# Patient Record
Sex: Male | Born: 1940 | Hispanic: No | Marital: Married | Smoking: Never smoker
Health system: Southern US, Community
[De-identification: ages and names within clinical notes are randomized; demographics above are authoritative.]

## PROBLEM LIST (undated history)

## (undated) DIAGNOSIS — Z8719 Personal history of other diseases of the digestive system: Secondary | ICD-10-CM

## (undated) DIAGNOSIS — D689 Coagulation defect, unspecified: Secondary | ICD-10-CM

## (undated) DIAGNOSIS — I5022 Chronic systolic (congestive) heart failure: Secondary | ICD-10-CM

## (undated) DIAGNOSIS — I5020 Unspecified systolic (congestive) heart failure: Secondary | ICD-10-CM

## (undated) DIAGNOSIS — I4729 Other ventricular tachycardia: Secondary | ICD-10-CM

## (undated) DIAGNOSIS — Z87442 Personal history of urinary calculi: Secondary | ICD-10-CM

## (undated) DIAGNOSIS — Z5189 Encounter for other specified aftercare: Secondary | ICD-10-CM

## (undated) DIAGNOSIS — I471 Supraventricular tachycardia, unspecified: Secondary | ICD-10-CM

## (undated) DIAGNOSIS — H409 Unspecified glaucoma: Secondary | ICD-10-CM

## (undated) DIAGNOSIS — G4730 Sleep apnea, unspecified: Secondary | ICD-10-CM

## (undated) DIAGNOSIS — I509 Heart failure, unspecified: Secondary | ICD-10-CM

## (undated) DIAGNOSIS — I77810 Thoracic aortic ectasia: Secondary | ICD-10-CM

## (undated) DIAGNOSIS — J309 Allergic rhinitis, unspecified: Secondary | ICD-10-CM

## (undated) DIAGNOSIS — K219 Gastro-esophageal reflux disease without esophagitis: Secondary | ICD-10-CM

## (undated) DIAGNOSIS — K279 Peptic ulcer, site unspecified, unspecified as acute or chronic, without hemorrhage or perforation: Secondary | ICD-10-CM

## (undated) DIAGNOSIS — K5792 Diverticulitis of intestine, part unspecified, without perforation or abscess without bleeding: Secondary | ICD-10-CM

## (undated) DIAGNOSIS — I2510 Atherosclerotic heart disease of native coronary artery without angina pectoris: Secondary | ICD-10-CM

## (undated) DIAGNOSIS — I493 Ventricular premature depolarization: Secondary | ICD-10-CM

## (undated) DIAGNOSIS — K922 Gastrointestinal hemorrhage, unspecified: Secondary | ICD-10-CM

## (undated) DIAGNOSIS — E785 Hyperlipidemia, unspecified: Secondary | ICD-10-CM

## (undated) DIAGNOSIS — M1990 Unspecified osteoarthritis, unspecified site: Secondary | ICD-10-CM

## (undated) DIAGNOSIS — M26609 Unspecified temporomandibular joint disorder, unspecified side: Secondary | ICD-10-CM

## (undated) DIAGNOSIS — I499 Cardiac arrhythmia, unspecified: Secondary | ICD-10-CM

## (undated) DIAGNOSIS — I10 Essential (primary) hypertension: Principal | ICD-10-CM

## (undated) HISTORY — DX: Peptic ulcer, site unspecified, unspecified as acute or chronic, without hemorrhage or perforation: K27.9

## (undated) HISTORY — DX: Coagulation defect, unspecified: D68.9

## (undated) HISTORY — DX: Allergic rhinitis, unspecified: J30.9

## (undated) HISTORY — DX: Unspecified systolic (congestive) heart failure: I50.20

## (undated) HISTORY — DX: Encounter for other specified aftercare: Z51.89

## (undated) HISTORY — PX: ROTATOR CUFF REPAIR: SHX139

## (undated) HISTORY — DX: Supraventricular tachycardia: I47.1

## (undated) HISTORY — DX: Thoracic aortic ectasia: I77.810

## (undated) HISTORY — DX: Chronic systolic (congestive) heart failure: I50.22

## (undated) HISTORY — DX: Gastrointestinal hemorrhage, unspecified: K92.2

## (undated) HISTORY — DX: Unspecified osteoarthritis, unspecified site: M19.90

## (undated) HISTORY — DX: Other ventricular tachycardia: I47.29

## (undated) HISTORY — DX: Ventricular premature depolarization: I49.3

## (undated) HISTORY — DX: Unspecified temporomandibular joint disorder, unspecified side: M26.609

## (undated) HISTORY — DX: Sleep apnea, unspecified: G47.30

## (undated) HISTORY — DX: Atherosclerotic heart disease of native coronary artery without angina pectoris: I25.10

## (undated) HISTORY — DX: Hyperlipidemia, unspecified: E78.5

## (undated) HISTORY — DX: Personal history of urinary calculi: Z87.442

## (undated) HISTORY — PX: JOINT REPLACEMENT: SHX530

## (undated) HISTORY — DX: Supraventricular tachycardia, unspecified: I47.10

## (undated) HISTORY — PX: EYE SURGERY: SHX253

## (undated) HISTORY — DX: Unspecified glaucoma: H40.9

## (undated) HISTORY — DX: Essential (primary) hypertension: I10

---

## 1999-12-31 ENCOUNTER — Inpatient Hospital Stay (HOSPITAL_COMMUNITY): Admission: EM | Admit: 1999-12-31 | Discharge: 2000-01-01 | Payer: Self-pay | Admitting: Specialist

## 2000-11-07 ENCOUNTER — Observation Stay (HOSPITAL_COMMUNITY): Admission: RE | Admit: 2000-11-07 | Discharge: 2000-11-08 | Payer: Self-pay | Admitting: Specialist

## 2003-05-15 ENCOUNTER — Ambulatory Visit (HOSPITAL_BASED_OUTPATIENT_CLINIC_OR_DEPARTMENT_OTHER): Admission: RE | Admit: 2003-05-15 | Discharge: 2003-05-15 | Payer: Self-pay | Admitting: Urology

## 2003-05-15 ENCOUNTER — Encounter (INDEPENDENT_AMBULATORY_CARE_PROVIDER_SITE_OTHER): Payer: Self-pay | Admitting: *Deleted

## 2003-05-15 HISTORY — PX: HYDROCELE EXCISION: SHX482

## 2003-05-15 HISTORY — PX: SPERMATOCELECTOMY: SHX2420

## 2005-05-24 ENCOUNTER — Ambulatory Visit: Payer: Self-pay | Admitting: Internal Medicine

## 2005-09-30 ENCOUNTER — Ambulatory Visit: Payer: Self-pay | Admitting: Internal Medicine

## 2005-10-18 ENCOUNTER — Ambulatory Visit: Payer: Self-pay | Admitting: Internal Medicine

## 2005-10-25 ENCOUNTER — Ambulatory Visit: Payer: Self-pay | Admitting: Internal Medicine

## 2005-11-15 ENCOUNTER — Ambulatory Visit: Payer: Self-pay | Admitting: Internal Medicine

## 2006-12-09 ENCOUNTER — Encounter: Admission: RE | Admit: 2006-12-09 | Discharge: 2006-12-09 | Payer: Self-pay | Admitting: Gastroenterology

## 2006-12-26 ENCOUNTER — Ambulatory Visit: Payer: Self-pay | Admitting: Internal Medicine

## 2007-02-23 ENCOUNTER — Ambulatory Visit: Payer: Self-pay | Admitting: Internal Medicine

## 2007-07-13 ENCOUNTER — Ambulatory Visit: Payer: Self-pay | Admitting: Internal Medicine

## 2007-07-13 DIAGNOSIS — K279 Peptic ulcer, site unspecified, unspecified as acute or chronic, without hemorrhage or perforation: Secondary | ICD-10-CM | POA: Insufficient documentation

## 2007-07-13 DIAGNOSIS — Z87442 Personal history of urinary calculi: Secondary | ICD-10-CM

## 2007-07-13 DIAGNOSIS — R079 Chest pain, unspecified: Secondary | ICD-10-CM | POA: Insufficient documentation

## 2007-07-13 DIAGNOSIS — I10 Essential (primary) hypertension: Secondary | ICD-10-CM

## 2007-07-13 DIAGNOSIS — J309 Allergic rhinitis, unspecified: Secondary | ICD-10-CM

## 2007-07-13 DIAGNOSIS — E785 Hyperlipidemia, unspecified: Secondary | ICD-10-CM | POA: Insufficient documentation

## 2007-07-13 HISTORY — DX: Allergic rhinitis, unspecified: J30.9

## 2007-07-13 HISTORY — DX: Hyperlipidemia, unspecified: E78.5

## 2007-07-13 HISTORY — DX: Peptic ulcer, site unspecified, unspecified as acute or chronic, without hemorrhage or perforation: K27.9

## 2007-07-13 HISTORY — DX: Essential (primary) hypertension: I10

## 2007-07-13 HISTORY — DX: Personal history of urinary calculi: Z87.442

## 2007-09-21 ENCOUNTER — Telehealth: Payer: Self-pay | Admitting: Internal Medicine

## 2007-10-16 ENCOUNTER — Ambulatory Visit: Payer: Self-pay | Admitting: Internal Medicine

## 2007-11-30 LAB — HM COLONOSCOPY

## 2008-09-03 ENCOUNTER — Ambulatory Visit: Payer: Self-pay | Admitting: Internal Medicine

## 2008-09-03 DIAGNOSIS — J069 Acute upper respiratory infection, unspecified: Secondary | ICD-10-CM | POA: Insufficient documentation

## 2008-12-23 ENCOUNTER — Encounter: Admission: RE | Admit: 2008-12-23 | Discharge: 2008-12-23 | Payer: Self-pay | Admitting: Specialist

## 2009-04-15 ENCOUNTER — Ambulatory Visit: Payer: Self-pay | Admitting: Internal Medicine

## 2009-04-15 DIAGNOSIS — M26609 Unspecified temporomandibular joint disorder, unspecified side: Secondary | ICD-10-CM

## 2009-04-15 HISTORY — DX: Unspecified temporomandibular joint disorder, unspecified side: M26.609

## 2009-06-04 ENCOUNTER — Encounter: Payer: Self-pay | Admitting: Internal Medicine

## 2009-06-19 ENCOUNTER — Encounter: Payer: Self-pay | Admitting: Internal Medicine

## 2009-07-09 ENCOUNTER — Ambulatory Visit: Payer: Self-pay | Admitting: Internal Medicine

## 2009-07-09 LAB — CONVERTED CEMR LAB
ALT: 23 units/L (ref 0–53)
Albumin: 3.7 g/dL (ref 3.5–5.2)
Alkaline Phosphatase: 43 units/L (ref 39–117)
Basophils Absolute: 0 10*3/uL (ref 0.0–0.1)
Basophils Relative: 0 % (ref 0.0–3.0)
Bilirubin Urine: NEGATIVE
CO2: 29 meq/L (ref 19–32)
Chloride: 105 meq/L (ref 96–112)
Cholesterol: 210 mg/dL — ABNORMAL HIGH (ref 0–200)
Eosinophils Absolute: 0.9 10*3/uL — ABNORMAL HIGH (ref 0.0–0.7)
Eosinophils Relative: 9.7 % — ABNORMAL HIGH (ref 0.0–5.0)
GFR calc non Af Amer: 79.04 mL/min (ref >60)
HCT: 43.2 % (ref 39.0–52.0)
HDL: 36.6 mg/dL — ABNORMAL LOW (ref >39.00)
Hemoglobin: 14.8 g/dL (ref 13.0–17.0)
MCHC: 34.3 g/dL (ref 30.0–36.0)
MCV: 94.3 fL (ref 78.0–100.0)
Monocytes Relative: 5 % (ref 3.0–12.0)
Nitrite: NEGATIVE
PSA: 0.73 ng/mL (ref 0.10–4.00)
Platelets: 200 10*3/uL (ref 150.0–400.0)
RBC: 4.58 M/uL (ref 4.22–5.81)
Specific Gravity, Urine: 1.02
TSH: 1.91 microintl units/mL (ref 0.35–5.50)
Total Protein: 6.3 g/dL (ref 6.0–8.3)
Urobilinogen, UA: 0.2
VLDL: 39.6 mg/dL (ref 0.0–40.0)
WBC Urine, dipstick: NEGATIVE

## 2009-07-15 ENCOUNTER — Encounter: Payer: Self-pay | Admitting: Internal Medicine

## 2009-07-16 ENCOUNTER — Ambulatory Visit: Payer: Self-pay | Admitting: Internal Medicine

## 2009-08-25 ENCOUNTER — Telehealth: Payer: Self-pay | Admitting: Internal Medicine

## 2010-02-19 ENCOUNTER — Encounter: Payer: Self-pay | Admitting: Internal Medicine

## 2010-03-27 ENCOUNTER — Ambulatory Visit: Payer: Self-pay | Admitting: Family Medicine

## 2010-12-27 LAB — CONVERTED CEMR LAB
AST: 19 units/L (ref 0–37)
Albumin: 4.1 g/dL (ref 3.5–5.2)
BUN: 18 mg/dL (ref 6–23)
Basophils Absolute: 0 10*3/uL (ref 0.0–0.1)
Basophils Relative: 0.1 % (ref 0.0–1.0)
CO2: 27 meq/L (ref 19–32)
Chloride: 110 meq/L (ref 96–112)
Creatinine, Ser: 1.3 mg/dL (ref 0.4–1.5)
Eosinophils Absolute: 1.4 10*3/uL — ABNORMAL HIGH (ref 0.0–0.6)
GFR calc Af Amer: 71 mL/min
GFR calc non Af Amer: 59 mL/min
Hemoglobin: 15 g/dL (ref 13.0–17.0)
Lymphocytes Relative: 14.2 % (ref 12.0–46.0)
MCHC: 34.9 g/dL (ref 30.0–36.0)
MCV: 94.4 fL (ref 78.0–100.0)
Monocytes Absolute: 0.4 10*3/uL (ref 0.2–0.7)
Monocytes Relative: 4.2 % (ref 3.0–11.0)
PSA: 2.02 ng/mL (ref 0.10–4.00)
Platelets: 283 10*3/uL (ref 150–400)
Potassium: 4.2 meq/L (ref 3.5–5.1)
RBC: 4.55 M/uL (ref 4.22–5.81)
Sodium: 143 meq/L (ref 135–145)
TSH: 1.1 microintl units/mL (ref 0.35–5.50)
Total Protein: 6.7 g/dL (ref 6.0–8.3)

## 2010-12-31 NOTE — Miscellaneous (Signed)
Summary: [facility]  Clinical Lists Changes  Medications: Changed medication from CIALIS 10 MG TABS (TADALAFIL) once daily as directed to * CIALIS 5 MG TABS (TADALAFIL) once daily as directed - Signed Rx of CIALIS 5 MG TABS (TADALAFIL) once daily as directed;  #90 x 6;  Signed;  Entered by: Duard Brady LPN;  Authorized by: Gordy Savers  MD;  Method used: Faxed to [facility] [address] [phone] [place], [place], [place]  [address], Ph: [phone], Fax: [phone]    Prescriptions: CIALIS 5 MG TABS (TADALAFIL) once daily as directed  #90 x 6   Entered by:   Duard Brady LPN   Authorized by:   Gordy Savers  MD   Signed by:   Duard Brady LPN on 65/78/4696   Method used:   Faxed to ...       [facility] [address] (retail)       [phone]       [place], [place]  [address]       Ph: [phone]       Fax: [phone]   RxID:   [id number]

## 2010-12-31 NOTE — Assessment & Plan Note (Signed)
Summary: COLD/COUGH/NJR   Vital Signs:  Patient profile:   70 year old male Weight:      183 pounds Temp:     98.3 degrees F oral BP sitting:   160 / 88  (left arm) Cuff size:   regular  Vitals Entered By: Sid Falcon LPN (March 27, 2010 1:51 PM) CC: Cough X 2 days   History of Present Illness: Patient seen with 2 to three-day history of mostly nonproductive cough, bilateral ear pressure, nasal congestion and night sweats but no fevers. Mild body aches. Has been dealing with some allergy symptoms over the past several weeks. Has taken Allegra and Zyrtec without much improvement.  Allergies: 1)  Ultram (Tramadol Hcl)  Past History:  Past Medical History: Last updated: 07/13/2007 Allergic rhinitis Hypertension  Hyperlipidemia Peptic ulcer disease  Review of Systems      See HPI  Physical Exam  General:  Well-developed,well-nourished,in no acute distress; alert,appropriate and cooperative throughout examination Head:  Normocephalic and atraumatic without obvious abnormalities. No apparent alopecia or balding. Ears:  right ear effusion. Left  appears normal Nose:  clear mucous bilaterally Mouth:  Oral mucosa and oropharynx without lesions or exudates.  Teeth in good repair. Neck:  No deformities, masses, or tenderness noted. Lungs:  Normal respiratory effort, chest expands symmetrically. Lungs are clear to auscultation, no crackles or wheezes. Heart:  normal rate and regular rhythm.     Impression & Recommendations:  Problem # 1:  URI (ICD-465.9) cough med for suppresion.  Suspect viral URI. His updated medication list for this problem includes:    Hydrocodone-homatropine 5-1.5 Mg/39ml Syrp (Hydrocodone-homatropine) .Marland Kitchen... 1 teaspoon every 6 hours as needed for cough  Orders: Prescription Created Electronically [phone])  Complete Medication List: 1)  Klonopin 0.5 Mg Tabs (Clonazepam) .Marland Kitchen.. 1 every night 2)  Prilosec 20 Mg Cpdr (Omeprazole) .... Take 1 capsule by  mouth once a day 3)  Hydrochlorothiazide 25 Mg Tabs (Hydrochlorothiazide) .Marland Kitchen.. 1 once daily 4)  Cialis 5 Mg Tabs (tadalafil)  .... Once daily as directed 5)  Hydrocodone-homatropine 5-1.5 Mg/76ml Syrp (Hydrocodone-homatropine) .Marland Kitchen.. 1 teaspoon every 6 hours as needed for cough 6)  Fluticasone Propionate 50 Mcg/act Susp (Fluticasone propionate) .... 2 sprays per nostril once daily as needed allergies  Patient Instructions: 1)  Get plenty of rest, drink lots of clear liquids, and use Tylenol or Ibuprofen for fever and comfort. Return in 7-10 days if you're not better: sooner if you'er feeling worse.  2)  Check your  Blood Pressure regularly . If it is above: 140/90  you should make an appointment. Prescriptions: HYDROCODONE-HOMATROPINE 5-1.5 MG/5ML SYRP (HYDROCODONE-HOMATROPINE) 1 teaspoon every 6 hours as needed for cough  #6 oz x 0   Entered and Authorized by:   Evelena Peat MD   Signed by:   Evelena Peat MD on 03/27/2010   Method used:   Print then Give to Patient   RxID:   [id number] FLUTICASONE PROPIONATE 50 MCG/ACT SUSP (FLUTICASONE PROPIONATE) 2 sprays per nostril once daily as needed allergies  #1 x 5   Entered and Authorized by:   Evelena Peat MD   Signed by:   Evelena Peat MD on 03/27/2010   Method used:   Electronically to        [facility] [address] (retail)       [phone]       [place], [place]  [address]       Ph: [phone]  Fax: [phone]   RxID:   [id number]

## 2011-03-29 ENCOUNTER — Ambulatory Visit (INDEPENDENT_AMBULATORY_CARE_PROVIDER_SITE_OTHER): Payer: PRIVATE HEALTH INSURANCE | Admitting: Internal Medicine

## 2011-03-29 ENCOUNTER — Encounter: Payer: Self-pay | Admitting: Internal Medicine

## 2011-03-29 DIAGNOSIS — I10 Essential (primary) hypertension: Secondary | ICD-10-CM

## 2011-03-29 DIAGNOSIS — J309 Allergic rhinitis, unspecified: Secondary | ICD-10-CM

## 2011-03-29 DIAGNOSIS — J069 Acute upper respiratory infection, unspecified: Secondary | ICD-10-CM

## 2011-03-29 MED ORDER — HYDROCODONE-HOMATROPINE 5-1.5 MG/5ML PO SYRP: 5.0000 mL | ORAL_SOLUTION | Freq: Four times a day (QID) | ORAL | Status: DC | PRN

## 2011-03-29 NOTE — Patient Instructions (Signed)
Get plenty of rest, Drink lots of  clear liquids, and use Tylenol or ibuprofen for fever and discomfort.    Return in 6 months for follow-up  Call or return to clinic prn if these symptoms worsen or fail to improve as anticipated.

## 2011-03-29 NOTE — Progress Notes (Signed)
  Subjective:    [name], male    DOB: [birth year], 70 y.o.   MRN: [id number]  HPI A 70 year old patient who is seen today for followup of hypertension. Complaints today include the sinus congestion nonproductive cough and sore throat. He does have a history of allergic rhinitis. No fever chest pain shortness of breath.   Review of Systems  Constitutional: Positive for fatigue. Negative for fever, chills and appetite change.  HENT: Positive for congestion. Negative for hearing loss, ear pain, sore throat, trouble swallowing, neck stiffness, dental problem, voice change and tinnitus.   Eyes: Negative for pain, discharge and visual disturbance.  Respiratory: Positive for cough. Negative for chest tightness, wheezing and stridor.   Cardiovascular: Negative for chest pain, palpitations and leg swelling.  Gastrointestinal: Negative for nausea, vomiting, abdominal pain, diarrhea, constipation, blood in stool and abdominal distention.  Genitourinary: Negative for urgency, hematuria, flank pain, discharge, difficulty urinating and genital sores.  Musculoskeletal: Negative for myalgias, back pain, joint swelling, arthralgias and gait problem.  Skin: Negative for rash.  Neurological: Negative for dizziness, syncope, speech difficulty, weakness, numbness and headaches.  Hematological: Negative for adenopathy. Does not bruise/bleed easily.  Psychiatric/Behavioral: Negative for behavioral problems and dysphoric mood. The patient is not nervous/anxious.        Objective:   Physical Exam  Constitutional: He is oriented to person, place, and time. He appears well-developed and well-nourished. No distress.       Overweight no distress. Blood pressure 130/80  HENT:  Head: Normocephalic.  Right Ear: External ear normal.  Left Ear: External ear normal.  Eyes: Conjunctivae and EOM are normal.  Neck: Normal range of motion.  Cardiovascular: Normal rate and normal heart sounds.     Pulmonary/Chest: Breath sounds normal.  Abdominal: Bowel sounds are normal.  Musculoskeletal: Normal range of motion. He exhibits no edema and no tenderness.  Neurological: He is alert and oriented to person, place, and time.  Psychiatric: He has a normal mood and affect. His behavior is normal.          Assessment & Plan:    Hypertension stable URI. Will treat symptomatically Allergic rhinitis- samples of Nasonex dispensed  We'll schedule for CPX

## 2011-04-01 ENCOUNTER — Other Ambulatory Visit: Payer: Self-pay | Admitting: Internal Medicine

## 2011-04-13 ENCOUNTER — Other Ambulatory Visit (INDEPENDENT_AMBULATORY_CARE_PROVIDER_SITE_OTHER): Payer: PRIVATE HEALTH INSURANCE | Admitting: Internal Medicine

## 2011-04-13 DIAGNOSIS — E785 Hyperlipidemia, unspecified: Secondary | ICD-10-CM

## 2011-04-13 DIAGNOSIS — E669 Obesity, unspecified: Secondary | ICD-10-CM

## 2011-04-13 DIAGNOSIS — Z0000 Encounter for general adult medical examination without abnormal findings: Secondary | ICD-10-CM

## 2011-04-13 DIAGNOSIS — Z13220 Encounter for screening for lipoid disorders: Secondary | ICD-10-CM

## 2011-04-13 DIAGNOSIS — I10 Essential (primary) hypertension: Secondary | ICD-10-CM

## 2011-04-13 LAB — BASIC METABOLIC PANEL
BUN: 21 mg/dL (ref 6–23)
Chloride: 104 mEq/L (ref 96–112)
Creatinine, Ser: 1.2 mg/dL (ref 0.4–1.5)

## 2011-04-13 LAB — POCT URINALYSIS DIPSTICK
Bilirubin, UA: NEGATIVE
Blood, UA: NEGATIVE
Glucose, UA: NEGATIVE
Ketones, UA: NEGATIVE
Leukocytes, UA: NEGATIVE
Urobilinogen, UA: 0.2
pH, UA: 7

## 2011-04-13 LAB — CBC WITH DIFFERENTIAL/PLATELET
Basophils Absolute: 0 10*3/uL (ref 0.0–0.1)
Basophils Relative: 0.5 % (ref 0.0–3.0)
HCT: 41.5 % (ref 39.0–52.0)
Hemoglobin: 14.6 g/dL (ref 13.0–17.0)
Monocytes Absolute: 0.4 10*3/uL (ref 0.1–1.0)
Neutro Abs: 4 10*3/uL (ref 1.4–7.7)
Neutrophils Relative %: 62.7 % (ref 43.0–77.0)
RBC: 4.48 Mil/uL (ref 4.22–5.81)
RDW: 13.8 % (ref 11.5–14.6)

## 2011-04-13 LAB — LIPID PANEL
Cholesterol: 201 mg/dL — ABNORMAL HIGH (ref 0–200)
HDL: 36.9 mg/dL — ABNORMAL LOW (ref >39.00)
Triglycerides: 269 mg/dL — ABNORMAL HIGH (ref 0.0–149.0)

## 2011-04-13 LAB — PSA: PSA: 1.12 ng/mL (ref 0.10–4.00)

## 2011-04-13 LAB — HEPATIC FUNCTION PANEL
Alkaline Phosphatase: 45 U/L (ref 39–117)
Bilirubin, Direct: 0.1 mg/dL (ref 0.0–0.3)
Total Bilirubin: 0.7 mg/dL (ref 0.3–1.2)

## 2011-04-16 NOTE — Op Note (Signed)
[place]  Patient:    David Goodman, David Goodman                         MRN: [id number] Proc. Date: 11/07/00 Adm. Date:  [id number] Attending:  Montez Morita, Philips John                           Operative Report  PREOPERATIVE DIAGNOSIS:  Rotator cuff tear, right shoulder  POSTOPERATIVE DIAGNOSIS:  Rotator cuff tear, right shoulder.  OPERATIVE PROCEDURE:  Repair of rotator cuff tear, right shoulder.  SURGEON:  Deloris Ping. Montez Morita, M.D.  ASSISTANT:  Druscilla Brownie. Shela Nevin, P.A.  DESCRIPTION OF PROCEDURE:  After a suitable general anesthesia, he was positioned in the shoulder and prepped and draped routinely.  An incision was made from the distal clavicle to about one inch on the acromion, in line with the anterior acromion.  The deltoid was split in line with fat, and the tissue from the anterior acromion was removed -- including the coracoacromial ligament.  A wide cobb was placed underneath, and an anterior acromioplasty accomplished with the saw with a beveling action.  The coracoacromial ligament was followed to its distal attachment on the coracoid, and released there as well, so that in reattachment there would not be any tightness to it. A bit of the bursa was removed.  The tear was identified just as described on the MR. A bur was used to freshen up and create a little mini-trough over the humeral head and at its site of attachment.  Three drill holes were then placed through the bone distal to that, coming out into the little trough; allowing two sutures to pass through the drill holes with a central hole being shared by each limb of a mattress suture.  Two mattress sutures were then placed of #1 Tycron from the edge.  This was freshened up with a knife, back into the substance of the primarily supraspinatus, but also a little bit of the infraspinatus towards the back hitch.  As one suture was held down snugly, the other was tied, then the second one  tied.  Then some additional #1 PDS suture was used smooth things out.  The same PDS was used with three sutures being passed through two drill holes in the acromion, through the anterior deltoid and incorporating the coracoacromial ligament.  The anterior deltoid was repaired with some additional PDS in the fascia of the deltoid where it had been split.  Used some 2-0 in the subcutaneous and then a monocryl running suture, with Steri-Strips on the skin.  Placed into a velcro sling.  Prior to the start of the procedure, the patient had an intrascalene block done in the holding area by anesthesia for pain control in the postoperative period.  He went to recovery in good condition. DD:  11/07/00 TD:  11/07/00 Job: [id number]

## 2011-04-16 NOTE — H&P (Signed)
[facility]  Patient:    David Goodman, David Goodman                         MRN: [id number] Adm. Date:  11/07/00 Dictator:   Dorie Rank, P.A. CC:         David Goodman, M.D.  David Goodman, M.D. [facility] [place]   History and Physical  CHIEF COMPLAINT:  Right shoulder pain.  HISTORY OF PRESENT ILLNESS:  David Goodman has had increasing right shoulder pain for approximately one and a half years.  He had an MRI on December 15, 1999, which showed a full thickness tear of the right supraspinatus tendon and infraspinatus tendonosis/tendonopathy.  He has had minimal response to p.o. pain medications and anti-inflammatories.  PAST MEDICAL HISTORY:  History of bleeding ulcer x 6 years ago which has resolved.  Hypertension related to his last left rotator cuff repair in February 2001, which did resolve after a discharge.  The patient has also had previous postop anxiety from his left rotator cuff repair related to the confinement of his sling.  PAST SURGICAL HISTORY:  Left rotator cuff repair, February 2001.  ALLERGIES:  No known drug allergies.  SOCIAL HISTORY:  David Goodman is a 70 year old male of [place] descent.  He is married.  No tobacco use.  Occasional drink two to three times per week.  FAMILY HISTORY:  Father 41 years old, still living--no past medical history. Mother, 19, deceased due to birth complications.  REVIEW OF SYMPTOMS:  HEENT:  Negative.  CARDIOVASCULAR:  Negative.  PULMONARY: Negative.  ENDOCRINE:  Negative.  NEUROLOGIC:  Negative.  GU:  Negative.  GI: Negative.  MUSCULOSKELETAL:  Positive for right shoulder pain and decreased range of motion due to rotator cuff repair.  PHYSICAL EXAMINATION:  VITAL SIGNS:  Blood pressure 140/90, respirations 16, pulse 72.  GENERAL:  A 70 year old white male in no acute distress.  NECK:  Supple, negative for carotid bruits.  CHEST:  Lungs are clear to auscultation bilaterally, no wheezes, rhonchi  or rales noted.  BREAST:  Deferred, not pertinent to present illness.  HEART:  S1, S2, negative for murmur, rub, or gallop.  ABDOMEN:  Soft and supple, nontender, round, positive bowel sounds.  GU:  Not pertinent to current illness.  EXTREMITIES:  2+ radial pulse and symmetrical.  2+ dorsalis pedis pulse.  2+ posterior tibialis pulse and symmetrical.  Patient does have decreased range of motion to the right shoulder.  SKIN:  Clear.  Acyanotic.  No clubbing noted.  NEUROLOGIC:  EOMs intact.  IMPRESSION: 1. Right rotator cuff tear. 2. History of bleeding ulcers x 6 years ago. 3. Hypertension related to hospital stay in February 2001--resolved. 4. Previous postoperative anxiety related to confinement issues--a sling.  PLAN:  The patient is scheduled for a right rotator cuff repair with Dr. Debria Garret on 11/07/00. DD:  10/31/00 TD:  10/31/00 Job: [id number]

## 2011-04-16 NOTE — Op Note (Signed)
   David Goodman, David Goodman                            ACCOUNT NO.:  [id number]   MEDICAL RECORD NO.:  [id number]                   PATIENT TYPE:  AMB   LOCATION:  [facility]                                 FACILITY:  [facility]   PHYSICIAN:  Maretta Bees. Vonita Moss, M.D.             DATE OF BIRTH:  08/05/1941   DATE OF PROCEDURE:  05/15/2003  DATE OF DISCHARGE:                                 OPERATIVE REPORT   PREOPERATIVE DIAGNOSIS:  Right hydrocele.   POSTOPERATIVE DIAGNOSES:  Right hydrocele and right spermatocele.   PROCEDURE:  Right hydrocelectomy and spermatocelectomy.   SURGEON:  Maretta Bees. Vonita Moss, M.D.   ANESTHESIA:  General.   INDICATIONS:  This 70 year old gentleman has had a long history of an  enlarging right scrotal mass that is becoming more uncomfortable for him and  finally decided he needed relief and is brought to the OR today.  He is  advised about the risks of swelling, infection, bruising, and pain.   DESCRIPTION OF PROCEDURE:  The patient was brought to operating room and  placed in supine position.  The external genitalia were prepped and draped  in the usual fashion.  A transverse incision was made, and the cystic mass  was delivered into the operative field.  A large hydrocele with fluid  underneath it was unroofed, and then a large spermatocele was tediously  dissected out and removed intact.  A small portion of the distal right  epididymis required resection because it was stretched out over the top of  the spermatocele.  Care was taken to leave the vascular structures and vas  deferens intact.  A few small bleeders were controlled with electrocautery  and 3-0 chromic catgut ties.  At this point, the testicle and spermatic cord  were irrigated with sterile water.  There was excellent hemostasis after  resecting the majority of the hydrocele sac with fulguration of the edges.  The testicle was then placed back in the scrotum and the wound closed with  two layers of running  3-0 chromic catgut.  During the course of our closure,  0.5% Marcaine was injected for postop analgesia.  The wound was cleaned with  dry sterile gauze dressings.  He was taken to the recovery room in good  condition, having tolerated the procedure well.                                               Maretta Bees. Vonita Moss, M.D.    LJP/MEDQ  D:  05/15/2003  T:  05/15/2003  Job:  [id number]

## 2011-04-19 ENCOUNTER — Encounter: Payer: Self-pay | Admitting: Internal Medicine

## 2011-04-19 ENCOUNTER — Ambulatory Visit (INDEPENDENT_AMBULATORY_CARE_PROVIDER_SITE_OTHER): Payer: PRIVATE HEALTH INSURANCE | Admitting: Internal Medicine

## 2011-04-19 VITALS — BP 126/84 | HR 72 | Temp 98.0°F | Resp 16 | Ht 64.5 in | Wt 187.0 lb

## 2011-04-19 DIAGNOSIS — Z2911 Encounter for prophylactic immunotherapy for respiratory syncytial virus (RSV): Secondary | ICD-10-CM

## 2011-04-19 DIAGNOSIS — I10 Essential (primary) hypertension: Secondary | ICD-10-CM

## 2011-04-19 DIAGNOSIS — Z0000 Encounter for general adult medical examination without abnormal findings: Secondary | ICD-10-CM

## 2011-04-19 DIAGNOSIS — J309 Allergic rhinitis, unspecified: Secondary | ICD-10-CM

## 2011-04-19 NOTE — Progress Notes (Signed)
Subjective:    [name], male    DOB: 12/28/1940, 70 y.o.   MRN: [id number]  HPI 70 year old patient who is seen today for a wellness exam. Medical problems include hypertension and allergic rhinitis. He has a remote history of peptic ulcer disease as well as nephrolithiasis. He has had a screening colonoscopy and does get annual eye exams. Laboratory studies and EKG reviewed.  Past Medical History  Diagnosis Date  . ALLERGIC RHINITIS 07/13/2007  . HYPERLIPIDEMIA 07/13/2007  . HYPERTENSION 07/13/2007  . 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 PEPTIC ULCER DISEASE 07/13/2007  . 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 Rotator cuff repair     reports that he has never smoked. He has never used smokeless tobacco. He reports that he drinks alcohol. He reports that he does not use illicit drugs. family history is not on file. Allergies  Allergen Reactions  . Tramadol Hcl     REACTION: unspecified       Review of Systems  Constitutional: Negative for fever, chills, activity change, appetite change and fatigue.  HENT: Negative for hearing loss, ear pain, congestion, rhinorrhea, sneezing, mouth sores, trouble swallowing, neck pain, neck stiffness, dental problem, voice change, sinus pressure and tinnitus.   Eyes: Negative for photophobia, pain, redness and visual disturbance.  Respiratory: Negative for apnea, cough, choking, chest tightness, shortness of breath and wheezing.   Cardiovascular: Negative for chest pain, palpitations and leg swelling.  Gastrointestinal: Negative for nausea, vomiting, abdominal pain, diarrhea, constipation, blood in stool, abdominal distention, anal bleeding and rectal pain.  Genitourinary: Negative for dysuria, urgency, frequency, hematuria, flank pain, decreased urine volume, discharge, penile swelling, scrotal swelling, difficulty urinating, genital sores and testicular pain.  Musculoskeletal: Negative  for myalgias, back pain, joint swelling, arthralgias and gait problem.  Skin: Negative for color change, rash and wound.  Neurological: Negative for dizziness, tremors, seizures, syncope, facial asymmetry, speech difficulty, weakness, light-headedness, numbness and headaches.  Hematological: Negative for adenopathy. Does not bruise/bleed easily.  Psychiatric/Behavioral: Negative for suicidal ideas, hallucinations, behavioral problems, confusion, sleep disturbance, self-injury, dysphoric mood, decreased concentration and agitation. The patient is not nervous/anxious.        Objective:   Physical Exam  Constitutional: He appears well-developed and well-nourished.  HENT:  Head: Normocephalic and atraumatic.  Right Ear: External ear normal.  Left Ear: External ear normal.  Nose: Nose normal.  Mouth/Throat: Oropharynx is clear and moist.  Eyes: Conjunctivae and EOM are normal. Pupils are equal, round, and reactive to light. No scleral icterus.  Neck: Normal range of motion. Neck supple. No JVD present. No thyromegaly present.  Cardiovascular: Regular rhythm, normal heart sounds and intact distal pulses.  Exam reveals no gallop and no friction rub.   No murmur heard. Pulmonary/Chest: Effort normal and breath sounds normal. He exhibits no tenderness.  Abdominal: Soft. Bowel sounds are normal. He exhibits no distension and no mass. There is no tenderness.  Genitourinary: Prostate normal and penis normal.  Musculoskeletal: Normal range of motion. He exhibits no edema and no tenderness.  Lymphadenopathy:    He has no cervical adenopathy.  Neurological: He is alert. He has normal reflexes. No cranial nerve deficit. Coordination normal.  Skin: Skin is warm and dry. No rash noted.  Psychiatric: He has a normal mood and affect. His behavior is normal. Judgment and thought content normal.          Assessment & Plan:    Annual health assessment Hypertension  stable Mild dyslipidemia Mild  exogenous obesity  Low salt low calorie diet and coverage. Regular exercise also encouraged. He does have a gym membership that he does participate  In  3 times per week

## 2011-04-19 NOTE — Patient Instructions (Signed)
Limit your sodium (Salt) intake  You need to lose weight.  Consider a lower calorie diet and regular exercise.  Please check your blood pressure on a regular basis.  If it is consistently greater than 150/90, please make an office appointment.  Return in one year for follow-up  

## 2011-04-19 NOTE — Progress Notes (Signed)
Addended by: Duard Brady on: 04/19/2011 02:41 PM   Modules accepted: Orders

## 2011-04-27 ENCOUNTER — Encounter: Payer: PRIVATE HEALTH INSURANCE | Admitting: Internal Medicine

## 2011-06-15 ENCOUNTER — Other Ambulatory Visit: Payer: Self-pay | Admitting: Internal Medicine

## 2011-08-16 ENCOUNTER — Telehealth: Payer: Self-pay | Admitting: Internal Medicine

## 2011-08-16 MED ORDER — CLONAZEPAM 0.5 MG PO TABS: 0.5000 mg | ORAL_TABLET | Freq: Every evening | ORAL | Status: DC | PRN

## 2011-08-16 NOTE — Telephone Encounter (Signed)
Added to med list ,new rx called into [facility]

## 2011-08-16 NOTE — Telephone Encounter (Signed)
0.5 mg  #60

## 2011-08-16 NOTE — Telephone Encounter (Signed)
Please advise 

## 2011-08-16 NOTE — Telephone Encounter (Signed)
Pr requesting a prescription of Klonopin. He is having issues sleeping and said he had taken it in the past. He is requesting it be sent to [facility]. Please contact pt.

## 2011-12-01 ENCOUNTER — Other Ambulatory Visit: Payer: Self-pay | Admitting: Internal Medicine

## 2012-03-29 ENCOUNTER — Other Ambulatory Visit: Payer: Self-pay | Admitting: Internal Medicine

## 2012-04-27 ENCOUNTER — Encounter: Payer: Self-pay | Admitting: Internal Medicine

## 2012-04-27 ENCOUNTER — Ambulatory Visit (INDEPENDENT_AMBULATORY_CARE_PROVIDER_SITE_OTHER): Payer: Medicare Other | Admitting: Internal Medicine

## 2012-04-27 VITALS — BP 130/80 | Temp 98.1°F | Wt 183.0 lb

## 2012-04-27 DIAGNOSIS — M25559 Pain in unspecified hip: Secondary | ICD-10-CM

## 2012-04-27 DIAGNOSIS — M25551 Pain in right hip: Secondary | ICD-10-CM

## 2012-04-27 DIAGNOSIS — I10 Essential (primary) hypertension: Secondary | ICD-10-CM

## 2012-04-27 MED ORDER — CLONAZEPAM 0.5 MG PO TABS: 0.5000 mg | ORAL_TABLET | Freq: Two times a day (BID) | ORAL | Status: DC | PRN

## 2012-04-27 MED ORDER — TADALAFIL 5 MG PO TABS: 5.0000 mg | ORAL_TABLET | Freq: Every day | ORAL | Status: DC | PRN

## 2012-04-27 MED ORDER — HYDROCHLOROTHIAZIDE 25 MG PO TABS: 25.0000 mg | ORAL_TABLET | Freq: Every day | ORAL | Status: DC

## 2012-04-27 NOTE — Patient Instructions (Addendum)
Vimovo one twice daily  You  may move around, but avoid painful motions and activities.  Apply heat to the sore area for 15 to 20 minutes 3 or 4 times daily for the next two to 3 days.  Limit your sodium (Salt) intake    It is important that you exercise regularly, at least 20 minutes 3 to 4 times per week.  If you develop chest pain or shortness of breath seek  medical attention.

## 2012-04-27 NOTE — Progress Notes (Signed)
  Subjective:    Patient ID: Geran Haithcock, male    DOB: 1940-12-30, 71 y.o.   MRN: [id number]  HPI  71 year old patient who is seen today with a two-week history of intermittent right hip pain. Pain seems to be maximal in the right groin and right upper medial leg. Pain is aggravated by movement. He states that he has significant arthritis involving the left knee which he feels may be altering his gait. No trauma. No constitutional complaints.    Review of Systems  Musculoskeletal: Positive for myalgias and gait problem.       Objective:   Physical Exam  Constitutional: He appears well-developed and well-nourished. No distress.       Blood pressure 130/80  Musculoskeletal:       Straight leg test negative Range of motion right hip appear to be intact He did have mild tenderness to palpation over the upper anterior medial thigh region Right groin was unremarkable without adenopathy          Assessment & Plan:  Right hip pain. Suspect musculoligamentous. Will treat with VIMOVO one twice daily Heat warm compresses discussed Hypertension controlled Return 4-6 months for his annual exam

## 2012-05-02 ENCOUNTER — Telehealth: Payer: Self-pay | Admitting: Family Medicine

## 2012-05-02 DIAGNOSIS — Z0000 Encounter for general adult medical examination without abnormal findings: Secondary | ICD-10-CM

## 2012-05-02 DIAGNOSIS — E785 Hyperlipidemia, unspecified: Secondary | ICD-10-CM

## 2012-05-02 NOTE — Telephone Encounter (Signed)
Pt is going to [facility] 6.24 for cpx labs. Can you please put the orders in? Thanks.

## 2012-05-02 NOTE — Telephone Encounter (Signed)
Orders placed.

## 2012-05-10 ENCOUNTER — Telehealth: Payer: Self-pay | Admitting: Internal Medicine

## 2012-05-10 DIAGNOSIS — Z0000 Encounter for general adult medical examination without abnormal findings: Secondary | ICD-10-CM

## 2012-05-10 NOTE — Telephone Encounter (Signed)
done

## 2012-05-10 NOTE — Telephone Encounter (Signed)
Pt is req to get a Vit D test added to labs on 05/22/12. Pls order necessary labs. Pt is having labs drawn at [facility].

## 2012-05-11 ENCOUNTER — Encounter: Payer: Medicare Other | Admitting: Internal Medicine

## 2012-05-22 ENCOUNTER — Other Ambulatory Visit: Payer: Self-pay | Admitting: Internal Medicine

## 2012-05-22 ENCOUNTER — Other Ambulatory Visit (INDEPENDENT_AMBULATORY_CARE_PROVIDER_SITE_OTHER): Payer: Medicare Other

## 2012-05-22 ENCOUNTER — Other Ambulatory Visit: Payer: Medicare Other

## 2012-05-22 DIAGNOSIS — Z0000 Encounter for general adult medical examination without abnormal findings: Secondary | ICD-10-CM

## 2012-05-22 DIAGNOSIS — Z125 Encounter for screening for malignant neoplasm of prostate: Secondary | ICD-10-CM

## 2012-05-22 DIAGNOSIS — E785 Hyperlipidemia, unspecified: Secondary | ICD-10-CM

## 2012-05-22 LAB — CBC WITH DIFFERENTIAL/PLATELET
Basophils Relative: 0.4 % (ref 0.0–3.0)
Eosinophils Absolute: 0.8 10*3/uL — ABNORMAL HIGH (ref 0.0–0.7)
Eosinophils Relative: 10.2 % — ABNORMAL HIGH (ref 0.0–5.0)
HCT: 43.9 % (ref 39.0–52.0)
Lymphocytes Relative: 17.7 % (ref 12.0–46.0)
Lymphs Abs: 1.4 10*3/uL (ref 0.7–4.0)
Monocytes Absolute: 0.5 10*3/uL (ref 0.1–1.0)
Monocytes Relative: 6.5 % (ref 3.0–12.0)
Platelets: 229 10*3/uL (ref 150.0–400.0)
RBC: 4.82 Mil/uL (ref 4.22–5.81)

## 2012-05-22 LAB — BASIC METABOLIC PANEL
CO2: 28 mEq/L (ref 19–32)
GFR: 86.29 mL/min (ref >60.00)
Glucose, Bld: 107 mg/dL — ABNORMAL HIGH (ref 70–99)
Sodium: 141 mEq/L (ref 135–145)

## 2012-05-22 LAB — URINALYSIS
Hgb urine dipstick: NEGATIVE
Nitrite: NEGATIVE
Specific Gravity, Urine: 1.025 (ref 1.000–1.030)
Total Protein, Urine: NEGATIVE
Urine Glucose: NEGATIVE
pH: 6 (ref 5.0–8.0)

## 2012-05-22 LAB — LIPID PANEL: Triglycerides: 293 mg/dL — ABNORMAL HIGH (ref 0.0–149.0)

## 2012-05-22 LAB — HEPATIC FUNCTION PANEL: Bilirubin, Direct: 0.1 mg/dL (ref 0.0–0.3)

## 2012-05-22 LAB — LDL CHOLESTEROL, DIRECT: Direct LDL: 139.2 mg/dL

## 2012-05-23 LAB — VITAMIN D 25 HYDROXY (VIT D DEFICIENCY, FRACTURES): Vit D, 25-Hydroxy: 37 ng/mL (ref 30–89)

## 2012-05-29 ENCOUNTER — Encounter: Payer: Self-pay | Admitting: Internal Medicine

## 2012-05-30 ENCOUNTER — Encounter: Payer: Self-pay | Admitting: Internal Medicine

## 2012-05-30 ENCOUNTER — Ambulatory Visit (INDEPENDENT_AMBULATORY_CARE_PROVIDER_SITE_OTHER): Payer: Medicare Other | Admitting: Internal Medicine

## 2012-05-30 VITALS — BP 120/72 | HR 60 | Temp 98.0°F | Resp 18 | Ht 64.0 in | Wt 186.0 lb

## 2012-05-30 DIAGNOSIS — J309 Allergic rhinitis, unspecified: Secondary | ICD-10-CM

## 2012-05-30 DIAGNOSIS — I10 Essential (primary) hypertension: Secondary | ICD-10-CM

## 2012-05-30 DIAGNOSIS — Z299 Encounter for prophylactic measures, unspecified: Secondary | ICD-10-CM

## 2012-05-30 DIAGNOSIS — Z0000 Encounter for general adult medical examination without abnormal findings: Secondary | ICD-10-CM

## 2012-05-30 MED ORDER — HYDROCHLOROTHIAZIDE 25 MG PO TABS: 25.0000 mg | ORAL_TABLET | Freq: Every day | ORAL | Status: DC

## 2012-05-30 MED ORDER — CLONAZEPAM 0.5 MG PO TABS: 0.5000 mg | ORAL_TABLET | Freq: Two times a day (BID) | ORAL | Status: DC | PRN

## 2012-05-30 NOTE — Patient Instructions (Signed)
Limit your sodium (Salt) intake  Please check your blood pressure on a regular basis.  If it is consistently greater than 150/90, please make an office appointment.  colonoscopy  DASH Diet The DASH diet stands for "Dietary Approaches to Stop Hypertension." It is a healthy eating plan that has been shown to reduce high blood pressure (hypertension) in as little as 14 days, while also possibly providing other significant health benefits. These other health benefits include reducing the risk of breast cancer after menopause and reducing the risk of type 2 diabetes, heart disease, colon cancer, and stroke. Health benefits also include weight loss and slowing kidney failure in patients with chronic kidney disease.   DIET GUIDELINES  Limit salt (sodium). Your diet should contain less than 1500 mg of sodium daily.   Limit refined or processed carbohydrates. Your diet should include mostly whole grains. Desserts and added sugars should be used sparingly.   Include small amounts of heart-healthy fats. These types of fats include nuts, oils, and tub margarine. Limit saturated and trans fats. These fats have been shown to be harmful in the body.  CHOOSING FOODS   The following food groups are based on a 2000 calorie diet. See your Registered Dietitian for individual calorie needs. Grains and Grain Products (6 to 8 servings daily)  Eat More Often: Whole-wheat bread, brown rice, whole-grain or wheat pasta, quinoa, popcorn without added fat or salt (air popped).   Eat Less Often: White bread, white pasta, white rice, cornbread.  Vegetables (4 to 5 servings daily)  Eat More Often: Fresh, frozen, and canned vegetables. Vegetables may be raw, steamed, roasted, or grilled with a minimal amount of fat.   Eat Less Often/Avoid: Creamed or fried vegetables. Vegetables in a cheese sauce.  Fruit (4 to 5 servings daily)  Eat More Often: All fresh, canned (in natural juice), or frozen fruits. Dried fruits without  added sugar. One hundred percent fruit juice ( cup [237 mL] daily).   Eat Less Often: Dried fruits with added sugar. Canned fruit in light or heavy syrup.  [employer], Fish, and Poultry (2 servings or less daily. One serving is 3 to 4 oz [85-114 g]).  Eat More Often: Ninety percent or leaner ground beef, tenderloin, sirloin. Round cuts of beef, chicken breast, [place] breast. All fish. Grill, bake, or broil your meat. Nothing should be fried.   Eat Less Often/Avoid: Fatty cuts of meat, [place], or chicken leg, thigh, or wing. Fried cuts of meat or fish.  Dairy (2 to 3 servings)  Eat More Often: Low-fat or fat-free milk, low-fat plain or light yogurt, reduced-fat or part-skim cheese.   Eat Less Often/Avoid: Milk (whole, 2%, skim, or chocolate). Whole milk yogurt. Full-fat cheeses.  Nuts, Seeds, and Legumes (4 to 5 servings per week)  Eat More Often: All without added salt.   Eat Less Often/Avoid: Salted nuts and seeds, canned beans with added salt.  Fats and Sweets (limited)  Eat More Often: Vegetable oils, tub margarines without trans fats, sugar-free gelatin. Mayonnaise and salad dressings.   Eat Less Often/Avoid: Coconut oils, palm oils, butter, stick margarine, cream, half and half, cookies, candy, pie.  FOR MORE INFORMATION The Dash Diet Eating Plan: www.dashdiet.org Document Released: 11/04/2011 Document Reviewed: 10/25/2011 [facility] Patient Information 2012 [place], [place].

## 2012-05-30 NOTE — Progress Notes (Signed)
Subjective:    [name], male    DOB: 1940-12-23, 71 y.o.   MRN: [id number]  HPI  71 year old patient who is seen today for a wellness exam. He has a history of treated hypertension which has been well-controlled diuretic therapy. He has a history of allergic rhinitis. And 2009 he underwent a colonoscopy that revealed polyps. He has received notification for a followup colonoscopy. He has remote history of peptic ulcer disease and nephrolithiasis that has been stable. He has ADD.  Past Medical History  Diagnosis Date  . ALLERGIC RHINITIS 07/13/2007  . HYPERLIPIDEMIA 07/13/2007  . HYPERTENSION 07/13/2007  . 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 PEPTIC ULCER DISEASE 07/13/2007  . 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 Marital Status: Legally Separated    Spouse Name: N/A    Number of Children: N/A  . Years of Education: N/A   Occupational History  . Not on file.   Social History Main Topics  . Smoking status: Never Smoker   . Smokeless tobacco: Never Used  . Alcohol Use: Yes  . Drug Use: No  . Sexually Active: Not on file   Other Topics Concern  . Not on file   Social History Narrative  . No narrative on file    Past Surgical History  Procedure Date  . Rotator cuff repair     No family history on file.  Allergies  Allergen Reactions  . Tramadol Hcl     REACTION: unspecified    Current Outpatient Prescriptions on File Prior to Visit  Medication Sig Dispense Refill  . clonazePAM (KLONOPIN) 0.5 MG tablet Take 1 tablet (0.5 mg total) by mouth 2 (two) times daily as needed for anxiety.  60 tablet  3  . fexofenadine (ALLEGRA) 180 MG tablet Take 180 mg by mouth daily.        . hydrochlorothiazide (HYDRODIURIL) 25 MG tablet Take 1 tablet (25 mg total) by mouth daily.  90 tablet  3  . HYDROcodone-homatropine (HYDROMET) 5-1.5 MG/5ML syrup Take 5 mLs by mouth every 6 (six) hours as needed.  120 mL  1  . tadalafil (CIALIS) 5  MG tablet Take 1 tablet (5 mg total) by mouth daily as needed for erectile dysfunction.  90 tablet  4    BP 120/72  Pulse 60  Temp 98 F (36.7 C) (Oral)  Resp 18  Ht 5\' 4"  (1.626 m)  Wt 186 lb (84.369 kg)  BMI 31.93 kg/m2  SpO2 97%   1. Risk factors, based on past  M,S,F history-  cardiovascular risk factors include a history of hypertension  2.  Physical activities: Continues to work full time no exercise restrictions  3.  Depression/mood: No history of depression or mood disorder does have some mild anxiety  4.  Hearing: No deficits  5.  ADL's: Independent in all aspects of daily living  6.  Fall risk: Low no problems identified  7.  Home safety:  8.  Height weight, and visual acuity; height and weight stable no change in visual acuity  9.  Counseling: Heart healthy diet recommended more regular exercise and modest weight loss encouraged  10. Lab orders based on risk factors: Laboratory studies reviewed  11. Referral : Needs followup colonoscopy  12. Care plan: Followup colonoscopy will continue a home blood pressure monitoring  13. Cognitive assessment: Alert and oriented with normal affect. No cognitive dysfunction.       Review of Systems  Constitutional: Negative for fever, chills, activity change, appetite change and fatigue.  HENT: Negative for hearing loss, ear pain, congestion, rhinorrhea, sneezing, mouth sores, trouble swallowing, neck pain, neck stiffness, dental problem, voice change, sinus pressure and tinnitus.   Eyes: Negative for photophobia, pain, redness and visual disturbance.  Respiratory: Negative for apnea, cough, choking, chest tightness, shortness of breath and wheezing.   Cardiovascular: Negative for chest pain, palpitations and leg swelling.  Gastrointestinal: Negative for nausea, vomiting, abdominal pain, diarrhea, constipation, blood in stool, abdominal distention, anal bleeding and rectal pain.  Genitourinary: Negative for dysuria,  urgency, frequency, hematuria, flank pain, decreased urine volume, discharge, penile swelling, scrotal swelling, difficulty urinating, genital sores and testicular pain.  Musculoskeletal: Negative for myalgias, back pain, joint swelling, arthralgias and gait problem.       Right calf and swelling secondary to recent injury while bowling  Skin: Negative for color change, rash and wound.  Neurological: Negative for dizziness, tremors, seizures, syncope, facial asymmetry, speech difficulty, weakness, light-headedness, numbness and headaches.  Hematological: Negative for adenopathy. Does not bruise/bleed easily.  Psychiatric/Behavioral: Negative for suicidal ideas, hallucinations, behavioral problems, confusion, disturbed wake/sleep cycle, self-injury, dysphoric mood, decreased concentration and agitation. The patient is not nervous/anxious.        Objective:   Physical Exam  Constitutional: He appears well-developed and well-nourished.  HENT:  Head: Normocephalic and atraumatic.  Right Ear: External ear normal.  Left Ear: External ear normal.  Nose: Nose normal.  Mouth/Throat: Oropharynx is clear and moist.  Eyes: Conjunctivae and EOM are normal. Pupils are equal, round, and reactive to light. No scleral icterus.  Neck: Normal range of motion. Neck supple. No JVD present. No thyromegaly present.  Cardiovascular: Regular rhythm, normal heart sounds and intact distal pulses.  Exam reveals no gallop and no friction rub.   No murmur heard. Pulmonary/Chest: Effort normal and breath sounds normal. He exhibits no tenderness.  Abdominal: Soft. Bowel sounds are normal. He exhibits no distension and no mass. There is no tenderness.  Genitourinary: Prostate normal and penis normal.  Musculoskeletal: Normal range of motion. He exhibits no edema and no tenderness.       Soft tissue swelling right calf  Lymphadenopathy:    He has no cervical adenopathy.  Neurological: He is alert. He has normal  reflexes. No cranial nerve deficit. Coordination normal.  Skin: Skin is warm and dry. No rash noted.  Psychiatric: He has a normal mood and affect. His behavior is normal.          Assessment & Plan:   Preventive health examination Hypertension stable Allergic rhinitis  ED  Followup colonoscopy recommended. Regular exercise low salt diet modest weight loss all encouraged. Recheck 6 months

## 2012-10-03 ENCOUNTER — Other Ambulatory Visit: Payer: Self-pay

## 2012-10-03 MED ORDER — TADALAFIL 5 MG PO TABS: 5.0000 mg | ORAL_TABLET | Freq: Every day | ORAL | Status: DC | PRN

## 2012-11-15 ENCOUNTER — Other Ambulatory Visit: Payer: Self-pay | Admitting: Gastroenterology

## 2013-01-02 ENCOUNTER — Other Ambulatory Visit: Payer: Self-pay | Admitting: Internal Medicine

## 2013-05-10 ENCOUNTER — Other Ambulatory Visit: Payer: Self-pay | Admitting: Internal Medicine

## 2013-06-11 ENCOUNTER — Other Ambulatory Visit (INDEPENDENT_AMBULATORY_CARE_PROVIDER_SITE_OTHER): Payer: Medicare Other

## 2013-06-11 DIAGNOSIS — I10 Essential (primary) hypertension: Secondary | ICD-10-CM

## 2013-06-11 DIAGNOSIS — Z0000 Encounter for general adult medical examination without abnormal findings: Secondary | ICD-10-CM

## 2013-06-11 LAB — CBC WITH DIFFERENTIAL/PLATELET
Basophils Relative: 0.5 % (ref 0.0–3.0)
Eosinophils Absolute: 1.2 10*3/uL — ABNORMAL HIGH (ref 0.0–0.7)
Eosinophils Relative: 13.1 % — ABNORMAL HIGH (ref 0.0–5.0)
HCT: 44.9 % (ref 39.0–52.0)
Lymphs Abs: 1.4 10*3/uL (ref 0.7–4.0)
MCHC: 34.2 g/dL (ref 30.0–36.0)
MCV: 94.1 fl (ref 78.0–100.0)
Monocytes Absolute: 0.4 10*3/uL (ref 0.1–1.0)
Neutrophils Relative %: 65.9 % (ref 43.0–77.0)
Platelets: 235 10*3/uL (ref 150.0–400.0)
WBC: 8.9 10*3/uL (ref 4.5–10.5)

## 2013-06-11 LAB — HEPATIC FUNCTION PANEL
ALT: 25 U/L (ref 0–53)
Bilirubin, Direct: 0.1 mg/dL (ref 0.0–0.3)
Total Bilirubin: 0.6 mg/dL (ref 0.3–1.2)
Total Protein: 6.9 g/dL (ref 6.0–8.3)

## 2013-06-11 LAB — POCT URINALYSIS DIPSTICK
Blood, UA: NEGATIVE
Leukocytes, UA: NEGATIVE
Nitrite, UA: NEGATIVE
Urobilinogen, UA: 0.2
pH, UA: 6.5

## 2013-06-11 LAB — BASIC METABOLIC PANEL
BUN: 25 mg/dL — ABNORMAL HIGH (ref 6–23)
CO2: 30 mEq/L (ref 19–32)
Chloride: 103 mEq/L (ref 96–112)
Creatinine, Ser: 0.9 mg/dL (ref 0.4–1.5)
Glucose, Bld: 103 mg/dL — ABNORMAL HIGH (ref 70–99)
Potassium: 4.4 mEq/L (ref 3.5–5.1)

## 2013-06-11 LAB — TSH: TSH: 1.84 u[IU]/mL (ref 0.35–5.50)

## 2013-06-11 LAB — LDL CHOLESTEROL, DIRECT: Direct LDL: 152.4 mg/dL

## 2013-06-11 LAB — PSA: PSA: 0.7 ng/mL (ref 0.10–4.00)

## 2013-06-11 NOTE — Addendum Note (Signed)
Addended by: Bonnye Fava on: 06/11/2013 08:43 AM   Modules accepted: Orders

## 2013-06-12 LAB — VITAMIN D 25 HYDROXY (VIT D DEFICIENCY, FRACTURES): Vit D, 25-Hydroxy: 40 ng/mL (ref 30–89)

## 2013-06-18 ENCOUNTER — Encounter: Payer: Self-pay | Admitting: Internal Medicine

## 2013-06-18 ENCOUNTER — Ambulatory Visit (INDEPENDENT_AMBULATORY_CARE_PROVIDER_SITE_OTHER): Payer: Medicare Other | Admitting: Internal Medicine

## 2013-06-18 VITALS — BP 140/94 | HR 74 | Temp 98.0°F | Resp 20 | Ht 64.0 in | Wt 185.0 lb

## 2013-06-18 DIAGNOSIS — I10 Essential (primary) hypertension: Secondary | ICD-10-CM

## 2013-06-18 DIAGNOSIS — E785 Hyperlipidemia, unspecified: Secondary | ICD-10-CM

## 2013-06-18 DIAGNOSIS — N400 Enlarged prostate without lower urinary tract symptoms: Secondary | ICD-10-CM

## 2013-06-18 DIAGNOSIS — Z0000 Encounter for general adult medical examination without abnormal findings: Secondary | ICD-10-CM

## 2013-06-18 NOTE — Progress Notes (Signed)
[name], male   DOB: 1941/04/21, 72 y.o.   MRN: [id number]  Subjective:    [name], male    DOB: May 28, 1941, 72 y.o.   MRN: [id number]  HPI 72 -year-old patient who is seen today for a wellness exam. Medical problems include hypertension and allergic rhinitis. He has a remote history of peptic ulcer disease as well as nephrolithiasis. He has had a screening colonoscopy and does get annual eye exams. Laboratory studies and EKG reviewed. Complaints today include some urinary nocturia 2-3 times per night. Otherwise no new concerns or complaints. He does have treated hypertension which has been well-controlled on diuretic therapy. He did have followup colonoscopy last year  Past Medical History  Diagnosis Date  . ALLERGIC RHINITIS 07/13/2007  . HYPERLIPIDEMIA 07/13/2007  . HYPERTENSION 07/13/2007  . 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 PEPTIC ULCER DISEASE 07/13/2007  . 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 Rotator cuff repair      reports that he has never smoked. He has never used smokeless tobacco. He reports that  drinks alcohol. He reports that he does not use illicit drugs. family history is not on file. Allergies  Allergen Reactions  . Tramadol Hcl     REACTION: unspecified   1. Risk factors, based on past  M,S,F history-  cardiovascular risk factors include hypertension and mild dyslipidemia  2.  Physical activities: Fairly sedentary. Runs a restaurant  3.  Depression/mood: No history depression or mood disorder  4.  Hearing: Wears a hearing aid in the right ear  5.  ADL's: Independent in all aspects of daily living 6.  Fall risk: Low  7.  Home safety: No problems identified  8.  Height weight, and visual acuity; height and weight stable no change in visual acuity  9.  Counseling: Modest weight loss and regular exercise encouraged  10. Lab orders based on risk factors: Laboratory  profile reviewed  11. Referral : Not appropriate at this time  12. Care plan: Low salt diet regular exercise modest weight loss all recommended  13. Cognitive assessment: Alert and oriented normal affect. No cognitive dysfunction       Review of Systems  Constitutional: Negative for fever, chills, activity change, appetite change and fatigue.  HENT: Negative for hearing loss, ear pain, congestion, rhinorrhea, sneezing, mouth sores, trouble swallowing, neck pain, neck stiffness, dental problem, voice change, sinus pressure and tinnitus.   Eyes: Negative for photophobia, pain, redness and visual disturbance.  Respiratory: Negative for apnea, cough, choking, chest tightness, shortness of breath and wheezing.   Cardiovascular: Negative for chest pain, palpitations and leg swelling.  Gastrointestinal: Negative for nausea, vomiting, abdominal pain, diarrhea, constipation, blood in stool, abdominal distention, anal bleeding and rectal pain.  Genitourinary: Negative for dysuria, urgency, frequency, hematuria, flank pain, decreased urine volume, discharge, penile swelling, scrotal swelling, difficulty urinating, genital sores and testicular pain.  Musculoskeletal: Negative for myalgias, back pain, joint swelling, arthralgias and gait problem.  Skin: Negative for color change, rash and wound.  Neurological: Negative for dizziness, tremors, seizures, syncope, facial asymmetry, speech difficulty, weakness, light-headedness, numbness and headaches.  Hematological: Negative for adenopathy. Does not bruise/bleed easily.  Psychiatric/Behavioral: Negative for suicidal ideas, hallucinations, behavioral problems, confusion, sleep disturbance, self-injury, dysphoric mood, decreased concentration and agitation. The patient is not nervous/anxious.        Objective:   Physical Exam  Constitutional: He appears well-developed and well-nourished.  HENT:  Head:  Normocephalic and atraumatic.  Right Ear:  External ear normal.  Left Ear: External ear normal.  Nose: Nose normal.  Mouth/Throat: Oropharynx is clear and moist.  Eyes: Conjunctivae and EOM are normal. Pupils are equal, round, and reactive to light. No scleral icterus.  Neck: Normal range of motion. Neck supple. No JVD present. No thyromegaly present.  Cardiovascular: Normal heart sounds and intact distal pulses.  Exam reveals no gallop and no friction rub.   No murmur heard. Pedal pulses appear to be slight diminished on the left Occasional ectopics  Pulmonary/Chest: Effort normal and breath sounds normal. He exhibits no tenderness.  Abdominal: Soft. Bowel sounds are normal. He exhibits no distension and no mass. There is no tenderness.  Genitourinary: Penis normal.  Prostate +2 enlarged  Musculoskeletal: Normal range of motion. He exhibits no edema and no tenderness.  Lymphadenopathy:    He has no cervical adenopathy.  Neurological: He is alert. He has normal reflexes. No cranial nerve deficit. Coordination normal.  Skin: Skin is warm and dry. No rash noted.  Psychiatric: He has a normal mood and affect. His behavior is normal. Judgment and thought content normal.          Assessment & Plan:    Annual health assessment Hypertension stable Mild dyslipidemia Mild exogenous obesity  Low salt low calorie diet and coverage. Regular exercise also encouraged. He does have a gym membership that he plans to rejoin

## 2013-06-18 NOTE — Patient Instructions (Signed)
Limit your sodium (Salt) intake  Please check your blood pressure on a regular basis.  If it is consistently greater than 150/90, please make an office appointment.    It is important that you exercise regularly, at least 20 minutes 3 to 4 times per week.  If you develop chest pain or shortness of breath seek  medical attention.  You need to lose weight.  Consider a lower calorie diet and regular exercise.  Please check your blood pressure on a regular basis.  If it is consistently greater than 150/90, please make an office appointment.  Return in one year for follow-up

## 2013-06-19 ENCOUNTER — Telehealth: Payer: Self-pay | Admitting: Internal Medicine

## 2013-06-19 MED ORDER — PHENTERMINE HCL 30 MG PO CAPS: 30.0000 mg | ORAL_CAPSULE | ORAL | Status: DC

## 2013-06-19 NOTE — Telephone Encounter (Signed)
PT stated that Dr. Amador Cunas "forgot to give him a diet pill RX yesterday". Please assist.

## 2013-06-19 NOTE — Telephone Encounter (Signed)
Pt notified Rx was called into pharmacy. 

## 2013-06-19 NOTE — Telephone Encounter (Signed)
Phentermine  #30 one daily

## 2013-07-15 ENCOUNTER — Other Ambulatory Visit: Payer: Self-pay | Admitting: Internal Medicine

## 2013-07-16 ENCOUNTER — Other Ambulatory Visit: Payer: Self-pay | Admitting: *Deleted

## 2013-07-16 MED ORDER — CLONAZEPAM 0.5 MG PO TABS: ORAL_TABLET | ORAL | Status: DC

## 2013-11-15 ENCOUNTER — Other Ambulatory Visit: Payer: Self-pay | Admitting: Internal Medicine

## 2013-11-20 ENCOUNTER — Ambulatory Visit: Payer: Medicare Other

## 2014-03-21 ENCOUNTER — Encounter: Payer: Self-pay | Admitting: Internal Medicine

## 2014-03-21 ENCOUNTER — Ambulatory Visit (INDEPENDENT_AMBULATORY_CARE_PROVIDER_SITE_OTHER): Payer: Medicare Other | Admitting: Internal Medicine

## 2014-03-21 VITALS — BP 126/80 | HR 70 | Temp 97.9°F | Resp 20 | Ht 64.0 in | Wt 189.0 lb

## 2014-03-21 DIAGNOSIS — E785 Hyperlipidemia, unspecified: Secondary | ICD-10-CM

## 2014-03-21 DIAGNOSIS — I10 Essential (primary) hypertension: Secondary | ICD-10-CM

## 2014-03-21 MED ORDER — NAPROXEN 500 MG PO TABS: 500.0000 mg | ORAL_TABLET | Freq: Two times a day (BID) | ORAL | Status: DC

## 2014-03-21 MED ORDER — CLONAZEPAM 0.5 MG PO TABS: ORAL_TABLET | ORAL | Status: DC

## 2014-03-21 MED ORDER — TRAZODONE HCL 50 MG PO TABS: 25.0000 mg | ORAL_TABLET | Freq: Every evening | ORAL | Status: DC | PRN

## 2014-03-21 NOTE — Patient Instructions (Signed)
Limit your sodium (Salt) intake    It is important that you exercise regularly, at least 20 minutes 3 to 4 times per week.  If you develop chest pain or shortness of breath seek  medical attention.  You need to lose weight.  Consider a lower calorie diet and regular exercise.Insomnia Insomnia is frequent trouble falling and/or staying asleep. Insomnia can be a long term problem or a short term problem. Both are common. Insomnia can be a short term problem when the wakefulness is related to a certain stress or worry. Long term insomnia is often related to ongoing stress during waking hours and/or poor sleeping habits. Overtime, sleep deprivation itself can make the problem worse. Every little thing feels more severe because you are overtired and your ability to cope is decreased. CAUSES   Stress, anxiety, and depression.  Poor sleeping habits.  Distractions such as TV in the bedroom.  Naps close to bedtime.  Engaging in emotionally charged conversations before bed.  Technical reading before sleep.  Alcohol and other sedatives. They may make the problem worse. They can hurt normal sleep patterns and normal dream activity.  Stimulants such as caffeine for several hours prior to bedtime.  Pain syndromes and shortness of breath can cause insomnia.  Exercise late at night.  Changing time zones may cause sleeping problems (jet lag). It is sometimes helpful to have someone observe your sleeping patterns. They should look for periods of not breathing during the night (sleep apnea). They should also look to see how long those periods last. If you live alone or observers are uncertain, you can also be observed at a sleep clinic where your sleep patterns will be professionally monitored. Sleep apnea requires a checkup and treatment. Give your caregivers your medical history. Give your caregivers observations your family has made about your sleep.  SYMPTOMS   Not feeling rested in the  morning.  Anxiety and restlessness at bedtime.  Difficulty falling and staying asleep. TREATMENT   Your caregiver may prescribe treatment for an underlying medical disorders. Your caregiver can give advice or help if you are using alcohol or other drugs for self-medication. Treatment of underlying problems will usually eliminate insomnia problems.  Medications can be prescribed for short time use. They are generally not recommended for lengthy use.  Over-the-counter sleep medicines are not recommended for lengthy use. They can be habit forming.  You can promote easier sleeping by making lifestyle changes such as:  Using relaxation techniques that help with breathing and reduce muscle tension.  Exercising earlier in the day.  Changing your diet and the time of your last meal. No night time snacks.  Establish a regular time to go to bed.  Counseling can help with stressful problems and worry.  Soothing music and white noise may be helpful if there are background noises you cannot remove.  Stop tedious detailed work at least one hour before bedtime. HOME CARE INSTRUCTIONS   Keep a diary. Inform your caregiver about your progress. This includes any medication side effects. See your caregiver regularly. Take note of:  Times when you are asleep.  Times when you are awake during the night.  The quality of your sleep.  How you feel the next day. This information will help your caregiver care for you.  Get out of bed if you are still awake after 15 minutes. Read or do some quiet activity. Keep the lights down. Wait until you feel sleepy and go back to bed.  Keep regular  sleeping and waking hours. Avoid naps.  Exercise regularly.  Avoid distractions at bedtime. Distractions include watching television or engaging in any intense or detailed activity like attempting to balance the household checkbook.  Develop a bedtime ritual. Keep a familiar routine of bathing, brushing your  teeth, climbing into bed at the same time each night, listening to soothing music. Routines increase the success of falling to sleep faster.  Use relaxation techniques. This can be using breathing and muscle tension release routines. It can also include visualizing peaceful scenes. You can also help control troubling or intruding thoughts by keeping your mind occupied with boring or repetitive thoughts like the old concept of counting sheep. You can make it more creative like imagining planting one beautiful flower after another in your backyard garden.  During your day, work to eliminate stress. When this is not possible use some of the previous suggestions to help reduce the anxiety that accompanies stressful situations. MAKE SURE YOU:   Understand these instructions.  Will watch your condition.  Will get help right away if you are not doing well or get worse. Document Released: 11/12/2000 Document Revised: 02/07/2012 Document Reviewed: 12/13/2007 [facility] Patient Information 2014 [place].

## 2014-03-21 NOTE — Progress Notes (Signed)
Pre-visit discussion using our clinic review tool. No additional management support is needed unless otherwise documented below in the visit note.  

## 2014-03-21 NOTE — Progress Notes (Signed)
Subjective:    [name], male    DOB: Nov 27, 1941, 73 y.o.   MRN: [id number]  HPI Wt Readings from Last 3 Encounters:  03/21/14 189 lb (85.73 kg)  06/18/13 185 lb (83.915 kg)  05/30/12 186 lb (84.31 kg)   73 year old patient who has treated hypertension.  Complaints today include right upper back pain, right foot pain, and insomnia. He has been prescribed phentermine in the past for obesity, but no longer takes.  No stimulants.  Denies any daytime naps.  Runs a restaurant with long hours  Past Medical History  Diagnosis Date  . ALLERGIC RHINITIS 07/13/2007  . HYPERLIPIDEMIA 07/13/2007  . HYPERTENSION 07/13/2007  . 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 PEPTIC ULCER DISEASE 07/13/2007  . 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 Marital Status: Legally Separated    Spouse Name: N/A    Number of Children: N/A  . Years of Education: N/A   Occupational History  . Not on file.   Social History Main Topics  . Smoking status: Never Smoker   . Smokeless tobacco: Never Used  . Alcohol Use: Yes  . Drug Use: No  . Sexual Activity: Not on file   Other Topics Concern  . Not on file   Social History Narrative  . No narrative on file    Past Surgical History  Procedure Laterality Date  . Rotator cuff repair      History reviewed. No pertinent family history.  Allergies  Allergen Reactions  . Tramadol Hcl     REACTION: unspecified    Current Outpatient Prescriptions on File Prior to Visit  Medication Sig Dispense Refill  . fexofenadine (ALLEGRA) 180 MG tablet Take 180 mg by mouth daily.        . hydrochlorothiazide (HYDRODIURIL) 25 MG tablet TAKE ONE TABLET BY MOUTH ONCE DAILY  90 tablet  1  . tadalafil (CIALIS) 5 MG tablet Take 1 tablet (5 mg total) by mouth daily as needed for erectile dysfunction.  30 tablet  1   No current facility-administered medications on file prior to visit.    BP 126/80  Pulse 70  Temp(Src)  97.9 F (36.6 C) (Oral)  Resp 20  Ht 5\' 4"  (1.626 m)  Wt 189 lb (85.73 kg)  BMI 32.43 kg/m2  SpO2 96%     Review of Systems  Constitutional: Negative for fever, chills, appetite change and fatigue.  HENT: Negative for congestion, dental problem, ear pain, hearing loss, sore throat, tinnitus, trouble swallowing and voice change.   Eyes: Negative for pain, discharge and visual disturbance.  Respiratory: Negative for cough, chest tightness, wheezing and stridor.   Cardiovascular: Negative for chest pain, palpitations and leg swelling.  Gastrointestinal: Negative for nausea, vomiting, abdominal pain, diarrhea, constipation, blood in stool and abdominal distention.  Genitourinary: Negative for urgency, hematuria, flank pain, discharge, difficulty urinating and genital sores.  Musculoskeletal: Positive for arthralgias and back pain. Negative for gait problem, joint swelling, myalgias and neck stiffness.  Skin: Negative for rash.  Neurological: Negative for dizziness, syncope, speech difficulty, weakness, numbness and headaches.  Hematological: Negative for adenopathy. Does not bruise/bleed easily.  Psychiatric/Behavioral: Positive for sleep disturbance. Negative for behavioral problems and dysphoric mood. The patient is not nervous/anxious.        Objective:   Physical Exam  Constitutional: He is oriented to person, place, and time. He appears well-developed.  HENT:  Head: Normocephalic.  Right Ear: External ear normal.  Left Ear: External ear normal.  Eyes: Conjunctivae and EOM are normal.  Neck: Normal range of motion.  Cardiovascular: Normal rate and normal heart sounds.   Pulmonary/Chest: Breath sounds normal.  Abdominal: Bowel sounds are normal.  Musculoskeletal: Normal range of motion. He exhibits no edema and no tenderness.  Neurological: He is alert and oriented to person, place, and time.  Psychiatric: He has a normal mood and affect. His behavior is normal.            Assessment & Plan:   Hypertension well controlled Insomnia.  Sleep hygiene issues discussed.  We'll give a trial of trazodone Upper back pain muscular.  Naproxen refill  Schedule CPX

## 2014-03-22 ENCOUNTER — Telehealth: Payer: Self-pay | Admitting: Internal Medicine

## 2014-03-22 NOTE — Telephone Encounter (Signed)
Relevant patient education assigned to patient using Emmi. ° °

## 2014-05-02 ENCOUNTER — Telehealth: Payer: Self-pay | Admitting: Internal Medicine

## 2014-05-02 NOTE — Telephone Encounter (Signed)
[id number] - [place], [place] - [address] is requesting re-fill on tadalafil (CIALIS) 5 MG tablet

## 2014-05-03 MED ORDER — TADALAFIL 5 MG PO TABS: 5.0000 mg | ORAL_TABLET | Freq: Every day | ORAL | Status: DC | PRN

## 2014-05-03 NOTE — Telephone Encounter (Signed)
Rx sent to pharmacy   

## 2014-05-23 ENCOUNTER — Other Ambulatory Visit: Payer: Self-pay | Admitting: Internal Medicine

## 2014-07-18 ENCOUNTER — Other Ambulatory Visit (INDEPENDENT_AMBULATORY_CARE_PROVIDER_SITE_OTHER): Payer: Medicare Other

## 2014-07-18 DIAGNOSIS — E785 Hyperlipidemia, unspecified: Secondary | ICD-10-CM

## 2014-07-18 DIAGNOSIS — N400 Enlarged prostate without lower urinary tract symptoms: Secondary | ICD-10-CM

## 2014-07-18 DIAGNOSIS — Z0000 Encounter for general adult medical examination without abnormal findings: Secondary | ICD-10-CM

## 2014-07-18 DIAGNOSIS — I10 Essential (primary) hypertension: Secondary | ICD-10-CM

## 2014-07-18 LAB — LDL CHOLESTEROL, DIRECT: LDL DIRECT: 140.2 mg/dL

## 2014-07-18 LAB — BASIC METABOLIC PANEL
BUN: 19 mg/dL (ref 6–23)
CALCIUM: 9.4 mg/dL (ref 8.4–10.5)
CO2: 31 mEq/L (ref 19–32)
Chloride: 99 mEq/L (ref 96–112)
Creatinine, Ser: 1 mg/dL (ref 0.4–1.5)
GFR: 76.14 mL/min (ref >60.00)
GLUCOSE: 98 mg/dL (ref 70–99)
Potassium: 4.6 mEq/L (ref 3.5–5.1)
SODIUM: 138 meq/L (ref 135–145)

## 2014-07-18 LAB — LIPID PANEL
Cholesterol: 227 mg/dL — ABNORMAL HIGH (ref 0–200)
HDL: 40.3 mg/dL (ref >39.00)
NONHDL: 186.7
Total CHOL/HDL Ratio: 6
Triglycerides: 382 mg/dL — ABNORMAL HIGH (ref 0.0–149.0)
VLDL: 76.4 mg/dL — ABNORMAL HIGH (ref 0.0–40.0)

## 2014-07-18 LAB — POCT URINALYSIS DIPSTICK
Bilirubin, UA: NEGATIVE
Glucose, UA: NEGATIVE
Ketones, UA: NEGATIVE
Leukocytes, UA: NEGATIVE
NITRITE UA: NEGATIVE
PH UA: 7.5
PROTEIN UA: NEGATIVE
RBC UA: NEGATIVE
Spec Grav, UA: 1.015
Urobilinogen, UA: 0.2

## 2014-07-18 LAB — CBC WITH DIFFERENTIAL/PLATELET
BASOS ABS: 0 10*3/uL (ref 0.0–0.1)
Basophils Relative: 0.6 % (ref 0.0–3.0)
EOS PCT: 11.9 % — AB (ref 0.0–5.0)
Eosinophils Absolute: 1 10*3/uL — ABNORMAL HIGH (ref 0.0–0.7)
HCT: 44.8 % (ref 39.0–52.0)
HEMOGLOBIN: 15.3 g/dL (ref 13.0–17.0)
LYMPHS PCT: 18.2 % (ref 12.0–46.0)
Lymphs Abs: 1.5 10*3/uL (ref 0.7–4.0)
MCHC: 34.1 g/dL (ref 30.0–36.0)
MCV: 91.5 fl (ref 78.0–100.0)
MONOS PCT: 6.5 % (ref 3.0–12.0)
Monocytes Absolute: 0.5 10*3/uL (ref 0.1–1.0)
NEUTROS ABS: 5.3 10*3/uL (ref 1.4–7.7)
Neutrophils Relative %: 62.8 % (ref 43.0–77.0)
PLATELETS: 290 10*3/uL (ref 150.0–400.0)
RBC: 4.89 Mil/uL (ref 4.22–5.81)
RDW: 13.6 % (ref 11.5–15.5)
WBC: 8.4 10*3/uL (ref 4.0–10.5)

## 2014-07-18 LAB — HEPATIC FUNCTION PANEL
ALBUMIN: 4.3 g/dL (ref 3.5–5.2)
ALT: 29 U/L (ref 0–53)
AST: 27 U/L (ref 0–37)
Alkaline Phosphatase: 59 U/L (ref 39–117)
Bilirubin, Direct: 0.2 mg/dL (ref 0.0–0.3)
TOTAL PROTEIN: 6.8 g/dL (ref 6.0–8.3)
Total Bilirubin: 0.8 mg/dL (ref 0.2–1.2)

## 2014-07-18 LAB — PSA: PSA: 0.68 ng/mL (ref 0.10–4.00)

## 2014-07-18 LAB — TSH: TSH: 2.17 u[IU]/mL (ref 0.35–4.50)

## 2014-07-25 ENCOUNTER — Encounter: Payer: Self-pay | Admitting: Internal Medicine

## 2014-07-25 ENCOUNTER — Ambulatory Visit (INDEPENDENT_AMBULATORY_CARE_PROVIDER_SITE_OTHER): Payer: Medicare Other | Admitting: Internal Medicine

## 2014-07-25 VITALS — BP 134/71 | HR 75 | Temp 97.9°F | Resp 20 | Ht 64.0 in | Wt 185.0 lb

## 2014-07-25 DIAGNOSIS — E785 Hyperlipidemia, unspecified: Secondary | ICD-10-CM

## 2014-07-25 DIAGNOSIS — Z0000 Encounter for general adult medical examination without abnormal findings: Secondary | ICD-10-CM

## 2014-07-25 DIAGNOSIS — N400 Enlarged prostate without lower urinary tract symptoms: Secondary | ICD-10-CM

## 2014-07-25 DIAGNOSIS — I10 Essential (primary) hypertension: Secondary | ICD-10-CM

## 2014-07-25 NOTE — Progress Notes (Signed)
Pre visit review using our clinic review tool, if applicable. No additional management support is needed unless otherwise documented below in the visit note. 

## 2014-07-25 NOTE — Progress Notes (Signed)
[name], male   DOB: 03-Jun-1941, 73 y.o.   MRN: [id number]  Subjective:    [name], male    DOB: [birth year], 73 y.o.   MRN: [id number]  HPI 73 year old patient who is seen today for a wellness exam.  Medical problems include hypertension and allergic rhinitis. He has a remote history of peptic ulcer disease as well as nephrolithiasis. He has had a screening colonoscopy and does get annual eye exams. Laboratory studies and EKG reviewed last year. Complaints today include some urinary nocturia 2-3 times per night.  These have been stable complains Otherwise no new concerns. He does have treated hypertension which has been well-controlled on diuretic therapy. He did have followup colonoscopy 2 years ago or and will is looking for in the abdomen or and he can't 1 is this is a 26 8 in the is  Past Medical History  Diagnosis Date  . ALLERGIC RHINITIS 07/13/2007  . HYPERLIPIDEMIA 07/13/2007  . HYPERTENSION 07/13/2007  . 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 PEPTIC ULCER DISEASE 07/13/2007  . 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 Rotator cuff repair      reports that he has never smoked. He has never used smokeless tobacco. He reports that he drinks alcohol. He reports that he does not use illicit drugs. family history is not on file. Allergies  Allergen Reactions  . Tramadol Hcl     REACTION: unspecified   1. Risk factors, based on past  M,S,F history-  cardiovascular risk factors include hypertension and mild dyslipidemia  2.  Physical activities: Fairly sedentary. Runs a restaurant  3.  Depression/mood: No history depression or mood disorder  4.  Hearing: Wears a hearing aid in the right ear  5.  ADL's: Independent in all aspects of daily living 6.  Fall risk: Low  7.  Home safety: No problems identified  8.  Height weight, and visual acuity; height and weight stable no change in visual  acuity  9.  Counseling: Modest weight loss and regular exercise encouraged  10. Lab orders based on risk factors: Laboratory profile reviewed  11. Referral : Not appropriate at this time  12. Care plan: Low salt diet regular exercise modest weight loss all recommended  13. Cognitive assessment: Alert and oriented normal affect. No cognitive dysfunction       Review of Systems  Constitutional: Negative for fever, chills, activity change, appetite change and fatigue.  HENT: Negative for congestion, dental problem, ear pain, hearing loss, mouth sores, rhinorrhea, sinus pressure, sneezing, tinnitus, trouble swallowing and voice change.   Eyes: Negative for photophobia, pain, redness and visual disturbance.  Respiratory: Negative for apnea, cough, choking, chest tightness, shortness of breath and wheezing.   Cardiovascular: Negative for chest pain, palpitations and leg swelling.  Gastrointestinal: Negative for nausea, vomiting, abdominal pain, diarrhea, constipation, blood in stool, abdominal distention, anal bleeding and rectal pain.  Genitourinary: Negative for dysuria, urgency, frequency, hematuria, flank pain, decreased urine volume, discharge, penile swelling, scrotal swelling, difficulty urinating, genital sores and testicular pain.  Musculoskeletal: Negative for arthralgias, back pain, gait problem, joint swelling, myalgias, neck pain and neck stiffness.  Skin: Negative for color change, rash and wound.  Neurological: Negative for dizziness, tremors, seizures, syncope, facial asymmetry, speech difficulty, weakness, light-headedness, numbness and headaches.  Hematological: Negative for adenopathy. Does not bruise/bleed easily.  Psychiatric/Behavioral: Negative for suicidal ideas, hallucinations, behavioral problems, confusion, sleep disturbance, self-injury, dysphoric mood, decreased concentration  and agitation. The patient is not nervous/anxious.        Objective:   Physical Exam   Constitutional: He appears well-developed and well-nourished.  HENT:  Head: Normocephalic and atraumatic.  Right Ear: External ear normal.  Left Ear: External ear normal.  Nose: Nose normal.  Mouth/Throat: Oropharynx is clear and moist.  Eyes: Conjunctivae and EOM are normal. Pupils are equal, round, and reactive to light. No scleral icterus.  Neck: Normal range of motion. Neck supple. No JVD present. No thyromegaly present.  Cardiovascular: Normal heart sounds and intact distal pulses.  Exam reveals no gallop and no friction rub.   No murmur heard. Pedal pulses appear to be slight diminished on the left Occasional ectopics  Pulmonary/Chest: Effort normal and breath sounds normal. He exhibits no tenderness.  Abdominal: Soft. Bowel sounds are normal. He exhibits no distension and no mass. There is no tenderness.  Genitourinary: Penis normal.  Prostate +2 enlarged  Musculoskeletal: Normal range of motion. He exhibits no edema and no tenderness.  Lymphadenopathy:    He has no cervical adenopathy.  Neurological: He is alert. He has normal reflexes. No cranial nerve deficit. Coordination normal.  Skin: Skin is warm and dry. No rash noted.  Psychiatric: He has a normal mood and affect. His behavior is normal. Judgment and thought content normal.          Assessment & Plan:    Annual health assessment Hypertension stable Mild dyslipidemia Mild exogenous obesity  Low salt low calorie diet and coverage. Regular exercise also encouraged. He does have a gym membership that he plans to rejoin

## 2014-07-25 NOTE — Patient Instructions (Addendum)
Limit your sodium (Salt) intake    It is important that you exercise regularly, at least 20 minutes 3 to 4 times per week.  If you develop chest pain or shortness of breath seek  medical attention.  You need to lose weight.  Consider a lower calorie diet and regular exercise. Return in one year for follow-up Health Maintenance A healthy lifestyle and preventative care can promote health and wellness.  Maintain regular health, dental, and eye exams.  Eat a healthy diet. Foods like vegetables, fruits, whole grains, low-fat dairy products, and lean protein foods contain the nutrients you need and are low in calories. Decrease your intake of foods high in solid fats, added sugars, and salt. Get information about a proper diet from your health care provider, if necessary.  Regular physical exercise is one of the most important things you can do for your health. Most adults should get at least 150 minutes of moderate-intensity exercise (any activity that increases your heart rate and causes you to sweat) each week. In addition, most adults need muscle-strengthening exercises on 2 or more days a week.   Maintain a healthy weight. The body mass index (BMI) is a screening tool to identify possible weight problems. It provides an estimate of body fat based on height and weight. Your health care provider can find your BMI and can help you achieve or maintain a healthy weight. For males 20 years and older:  A BMI below 18.5 is considered underweight.  A BMI of 18.5 to 24.9 is normal.  A BMI of 25 to 29.9 is considered overweight.  A BMI of 30 and above is considered obese.  Maintain normal blood lipids and cholesterol by exercising and minimizing your intake of saturated fat. Eat a balanced diet with plenty of fruits and vegetables. Blood tests for lipids and cholesterol should begin at age 33 and be repeated every 5 years. If your lipid or cholesterol levels are high, you are over age 63, or you are  at high risk for heart disease, you may need your cholesterol levels checked more frequently.Ongoing high lipid and cholesterol levels should be treated with medicines if diet and exercise are not working.  If you smoke, find out from your health care provider how to quit. If you do not use tobacco, do not start.  Lung cancer screening is recommended for adults aged 71-80 years who are at high risk for developing lung cancer because of a history of smoking. A yearly low-dose CT scan of the lungs is recommended for people who have at least a 30-pack-year history of smoking and are current smokers or have quit within the past 15 years. A pack year of smoking is smoking an average of 1 pack of cigarettes a day for 1 year (for example, a 30-pack-year history of smoking could mean smoking 1 pack a day for 30 years or 2 packs a day for 15 years). Yearly screening should continue until the smoker has stopped smoking for at least 15 years. Yearly screening should be stopped for people who develop a health problem that would prevent them from having lung cancer treatment.  If you choose to drink alcohol, do not have more than 2 drinks per day. One drink is considered to be 12 oz (360 mL) of beer, 5 oz (150 mL) of wine, or 1.5 oz (45 mL) of liquor.  Avoid the use of street drugs. Do not share needles with anyone. Ask for help if you need support or  instructions about stopping the use of drugs.  High blood pressure causes heart disease and increases the risk of stroke. Blood pressure should be checked at least every 1-2 years. Ongoing high blood pressure should be treated with medicines if weight loss and exercise are not effective.  If you are 34-80 years old, ask your health care provider if you should take aspirin to prevent heart disease.  Diabetes screening involves taking a blood sample to check your fasting blood sugar level. This should be done once every 3 years after age 47 if you are at a normal  weight and without risk factors for diabetes. Testing should be considered at a younger age or be carried out more frequently if you are overweight and have at least 1 risk factor for diabetes.  Colorectal cancer can be detected and often prevented. Most routine colorectal cancer screening begins at the age of 51 and continues through age 20. However, your health care provider may recommend screening at an earlier age if you have risk factors for colon cancer. On a yearly basis, your health care provider may provide home test kits to check for hidden blood in the stool. A small camera at the end of a tube may be used to directly examine the colon (sigmoidoscopy or colonoscopy) to detect the earliest forms of colorectal cancer. Talk to your health care provider about this at age 61 when routine screening begins. A direct exam of the colon should be repeated every 5-10 years through age 4, unless early forms of precancerous polyps or small growths are found.  People who are at an increased risk for hepatitis B should be screened for this virus. You are considered at high risk for hepatitis B if:  You were born in a country where hepatitis B occurs often. Talk with your health care provider about which countries are considered high risk.  Your parents were born in a high-risk country and you have not received a shot to protect against hepatitis B (hepatitis B vaccine).  You have HIV or AIDS.  You use needles to inject street drugs.  You live with, or have sex with, someone who has hepatitis B.  You are a man who has sex with other men (MSM).  You get hemodialysis treatment.  You take certain medicines for conditions like cancer, organ transplantation, and autoimmune conditions.  Hepatitis C blood testing is recommended for all people born from [birth year] through 1965 and any individual with known risk factors for hepatitis C.  Healthy men should no longer receive prostate-specific antigen (PSA) blood  tests as part of routine cancer screening. Talk to your health care provider about prostate cancer screening.  Testicular cancer screening is not recommended for adolescents or adult males who have no symptoms. Screening includes self-exam, a health care provider exam, and other screening tests. Consult with your health care provider about any symptoms you have or any concerns you have about testicular cancer.  Practice safe sex. Use condoms and avoid high-risk sexual practices to reduce the spread of sexually transmitted infections (STIs).  You should be screened for STIs, including gonorrhea and chlamydia if:  You are sexually active and are younger than 24 years.  You are older than 24 years, and your health care provider tells you that you are at risk for this type of infection.  Your sexual activity has changed since you were last screened, and you are at an increased risk for chlamydia or gonorrhea. Ask your health care provider if  you are at risk.  If you are at risk of being infected with HIV, it is recommended that you take a prescription medicine daily to prevent HIV infection. This is called pre-exposure prophylaxis (PrEP). You are considered at risk if:  You are a man who has sex with other men (MSM).  You are a heterosexual man who is sexually active with multiple partners.  You take drugs by injection.  You are sexually active with a partner who has HIV.  Talk with your health care provider about whether you are at high risk of being infected with HIV. If you choose to begin PrEP, you should first be tested for HIV. You should then be tested every 3 months for as long as you are taking PrEP.  Use sunscreen. Apply sunscreen liberally and repeatedly throughout the day. You should seek shade when your shadow is shorter than you. Protect yourself by wearing long sleeves, pants, a wide-brimmed hat, and sunglasses year round whenever you are outdoors.  Tell your health care  provider of new moles or changes in moles, especially if there is a change in shape or color. Also, tell your health care provider if a mole is larger than the size of a pencil eraser.  A one-time screening for abdominal aortic aneurysm (AAA) and surgical repair of large AAAs by ultrasound is recommended for men aged 40-75 years who are current or former smokers.  Stay current with your vaccines (immunizations). Document Released: 05/13/2008 Document Revised: 11/20/2013 Document Reviewed: 04/12/2011 [facility] Patient Information 2015 [place], [place]. This information is not intended to replace advice given to you by your health care provider. Make sure you discuss any questions you have with your health care provider. Cardiac Diet This diet can help prevent heart disease and stroke. Many factors influence your heart health, including eating and exercise habits. Coronary risk rises a lot with abnormal blood fat (lipid) levels. Cardiac meal planning includes limiting unhealthy fats, increasing healthy fats, and making other small dietary changes. General guidelines are as follows:  Adjust calorie intake to reach and maintain desirable body weight.  Limit total fat intake to less than 30% of total calories. Saturated fat should be less than 7% of calories.  Saturated fats are found in animal products and in some vegetable products. Saturated vegetable fats are found in coconut oil, cocoa butter, palm oil, and palm kernel oil. Read labels carefully to avoid these products as much as possible. Use butter in moderation. Choose tub margarines and oils that have 2 grams of fat or less. Good cooking oils are canola and olive oils.  Practice low-fat cooking techniques. Do not fry food. Instead, broil, bake, boil, steam, grill, roast on a rack, stir-fry, or microwave it. Other fat reducing suggestions include:  Remove the skin from poultry.  Remove all visible fat from meats.  Skim the fat off stews,  soups, and gravies before serving them.  Steam vegetables in water or broth instead of sauting them in fat.  Avoid foods with trans fat (or hydrogenated oils), such as commercially fried foods and commercially baked goods. Commercial shortening and deep-frying fats will contain trans fat.  Increase intake of fruits, vegetables, whole grains, and legumes to replace foods high in fat.  Increase consumption of nuts, legumes, and seeds to at least 4 servings weekly. One serving of a legume equals  cup, and 1 serving of nuts or seeds equals  cup.  Choose whole grains more often. Have 3 servings per day (a serving  is 1 ounce [oz]).  Eat 4 to 5 servings of vegetables per day. A serving of vegetables is 1 cup of raw leafy vegetables;  cup of raw or cooked cut-up vegetables;  cup of vegetable juice.  Eat 4 to 5 servings of fruit per day. A serving of fruit is 1 medium whole fruit;  cup of dried fruit;  cup of fresh, frozen, or canned fruit;  cup of 100% fruit juice.  Increase your intake of dietary fiber to 20 to 30 grams per day. Insoluble fiber may help lower your risk of heart disease and may help curb your appetite.  Soluble fiber binds cholesterol to be removed from the blood. Foods high in soluble fiber are dried beans, citrus fruits, oats, apples, bananas, broccoli, Brussels sprouts, and eggplant.  Try to include foods fortified with plant sterols or stanols, such as yogurt, breads, juices, or margarines. Choose several fortified foods to achieve a daily intake of 2 to 3 grams of plant sterols or stanols.  Foods with omega-3 fats can help reduce your risk of heart disease. Aim to have a 3.5 oz portion of fatty fish twice per week, such as salmon, mackerel, albacore tuna, sardines, lake trout, or herring. If you wish to take a fish oil supplement, choose one that contains 1 gram of both DHA and EPA.  Limit processed meats to 2 servings (3 oz portion) weekly.  Limit the sodium in your  diet to 1500 milligrams (mg) per day. If you have high blood pressure, talk to a registered dietitian about a DASH (Dietary Approaches to Stop Hypertension) eating plan.  Limit sweets and beverages with added sugar, such as soda, to no more than 5 servings per week. One serving is:   1 tablespoon sugar.  1 tablespoon jelly or jam.   cup sorbet.  1 cup lemonade.   cup regular soda. CHOOSING FOODS Starches  Allowed: Breads: All kinds (wheat, rye, raisin, white, oatmeal, [place], [place], and English muffin bread). Low-fat rolls: English muffins, frankfurter and hamburger buns, bagels, pita bread, tortillas (not fried). Pancakes, waffles, biscuits, and muffins made with recommended oil.  Avoid: Products made with saturated or trans fats, oils, or whole milk products. Butter rolls, cheese breads, croissants. Commercial doughnuts, muffins, sweet rolls, biscuits, waffles, pancakes, store-bought mixes. Crackers  Allowed: Low-fat crackers and snacks: Animal, graham, rye, saltine (with recommended oil, no lard), oyster, and matzo crackers. Bread sticks, melba toast, rusks, flatbread, pretzels, and light popcorn.  Avoid: High-fat crackers: cheese crackers, butter crackers, and those made with coconut, palm oil, or trans fat (hydrogenated oils). Buttered popcorn. Cereals  Allowed: Hot or cold whole-grain cereals.  Avoid: Cereals containing coconut, hydrogenated vegetable fat, or animal fat. Potatoes / Pasta / Rice  Allowed: All kinds of potatoes, rice, and pasta (such as macaroni, spaghetti, and noodles).  Avoid: Pasta or rice prepared with cream sauce or high-fat cheese. Chow mein noodles, [place] fries. Vegetables  Allowed: All vegetables and vegetable juices.  Avoid: Fried vegetables. Vegetables in cream, butter, or high-fat cheese sauces. Limit coconut. Fruit in cream or custard. Protein  Allowed: Limit your intake of meat, seafood, and poultry to no more than 6 oz (cooked weight)  per day. All lean, well-trimmed beef, veal, pork, and lamb. All chicken and [place] without skin. All fish and shellfish. Wild game: wild duck, rabbit, pheasant, and venison. Egg whites or low-cholesterol egg substitutes may be used as desired. Meatless dishes: recipes with dried beans, peas, lentils, and tofu (soybean curd). Seeds and  nuts: all seeds and most nuts.  Avoid: Prime grade and other heavily marbled and fatty meats, such as short ribs, spare ribs, rib eye roast or steak, frankfurters, sausage, bacon, and high-fat luncheon meats, mutton. Caviar. Commercially fried fish. Domestic duck, goose, venison sausage. Organ meats: liver, gizzard, heart, chitterlings, brains, kidney, sweetbreads. Dairy  Allowed: Low-fat cheeses: nonfat or low-fat cottage cheese (1% or 2% fat), cheeses made with part skim milk, such as mozzarella, farmers, string, or ricotta. (Cheeses should be labeled no more than 2 to 6 grams fat per oz.). Skim (or 1%) milk: liquid, powdered, or evaporated. Buttermilk made with low-fat milk. Drinks made with skim or low-fat milk or cocoa. Chocolate milk or cocoa made with skim or low-fat (1%) milk. Nonfat or low-fat yogurt.  Avoid: Whole milk cheeses, including colby, cheddar, [place], Monterey Jack, [place], [place], [place], American, Swiss, and blue. Creamed cottage cheese, cream cheese. Whole milk and whole milk products, including buttermilk or yogurt made from whole milk, drinks made from whole milk. Condensed milk, evaporated whole milk, and 2% milk. Soups and Combination Foods  Allowed: Low-fat low-sodium soups: broth, dehydrated soups, homemade broth, soups with the fat removed, homemade cream soups made with skim or low-fat milk. Low-fat spaghetti, lasagna, chili, and Spanish rice if low-fat ingredients and low-fat cooking techniques are used.  Avoid: Cream soups made with whole milk, cream, or high-fat cheese. All other soups. Desserts and Sweets  Allowed: Sherbet, fruit  ices, gelatins, meringues, and angel food cake. Homemade desserts with recommended fats, oils, and milk products. Jam, jelly, honey, marmalade, sugars, and syrups. Pure sugar candy, such as gum drops, hard candy, jelly beans, marshmallows, mints, and small amounts of dark chocolate.  Avoid: Commercially prepared cakes, pies, cookies, frosting, pudding, or mixes for these products. Desserts containing whole milk products, chocolate, coconut, lard, palm oil, or palm kernel oil. Ice cream or ice cream drinks. Candy that contains chocolate, coconut, butter, hydrogenated fat, or unknown ingredients. Buttered syrups. Fats and Oils  Allowed: Vegetable oils: safflower, sunflower, corn, soybean, cottonseed, sesame, canola, olive, or peanut. Non-hydrogenated margarines. Salad dressing or mayonnaise: homemade or commercial, made with a recommended oil. Low or nonfat salad dressing or mayonnaise.  Limit added fats and oils to 6 to 8 tsp per day (includes fats used in cooking, baking, salads, and spreads on bread). Remember to count the "hidden fats" in foods.  Avoid: Solid fats and shortenings: butter, lard, salt pork, bacon drippings. Gravy containing meat fat, shortening, or suet. Cocoa butter, coconut. Coconut oil, palm oil, palm kernel oil, or hydrogenated oils: these ingredients are often used in bakery products, nondairy creamers, whipped toppings, candy, and commercially fried foods. Read labels carefully. Salad dressings made of unknown oils, sour cream, or cheese, such as blue cheese and Roquefort. Cream, all kinds: half-and-half, light, heavy, or whipping. Sour cream or cream cheese (even if "light" or low-fat). Nondairy cream substitutes: coffee creamers and sour cream substitutes made with palm, palm kernel, hydrogenated oils, or coconut oil. Beverages  Allowed: Coffee (regular or decaffeinated), tea. Diet carbonated beverages, mineral water. Alcohol: Check with your caregiver. Moderation is  recommended.  Avoid: Whole milk, regular sodas, and juice drinks with added sugar. Condiments  Allowed: All seasonings and condiments. Cocoa powder. "Cream" sauces made with recommended ingredients.  Avoid: Carob powder made with hydrogenated fats. SAMPLE MENU Breakfast   cup orange juice   cup oatmeal  1 slice toast  1 tsp margarine  1 cup skim milk Lunch  [place] sandwich with  2 oz [place], 2 slices bread  Lettuce and tomato slices  Fresh fruit  Carrot sticks  Coffee or tea Snack  Fresh fruit or low-fat crackers Dinner  3 oz lean ground beef  1 baked potato  1 tsp margarine   cup asparagus  Lettuce salad  1 tbs non-creamy dressing   cup peach slices  1 cup skim milk Document Released: 08/24/2008 Document Revised: 05/16/2012 Document Reviewed: 01/15/2014 [employer] Patient Information 2015 [place], [place]. This information is not intended to replace advice given to you by your health care provider. Make sure you discuss any questions you have with your health care provider.

## 2014-08-28 ENCOUNTER — Telehealth: Payer: Self-pay | Admitting: Internal Medicine

## 2014-08-28 MED ORDER — HYDROCHLOROTHIAZIDE 25 MG PO TABS: ORAL_TABLET | ORAL | Status: DC

## 2014-08-28 NOTE — Telephone Encounter (Signed)
Rx sent 

## 2014-08-28 NOTE — Telephone Encounter (Signed)
[facility] - [place], [place] - [address] is requesting re-fill on hydrochlorothiazide (HYDRODIURIL) 25 MG tablet

## 2014-11-14 ENCOUNTER — Other Ambulatory Visit: Payer: Self-pay | Admitting: Internal Medicine

## 2015-01-20 DIAGNOSIS — J309 Allergic rhinitis, unspecified: Secondary | ICD-10-CM | POA: Diagnosis not present

## 2015-02-10 DIAGNOSIS — J309 Allergic rhinitis, unspecified: Secondary | ICD-10-CM | POA: Diagnosis not present

## 2015-02-24 DIAGNOSIS — K219 Gastro-esophageal reflux disease without esophagitis: Secondary | ICD-10-CM | POA: Diagnosis not present

## 2015-02-24 DIAGNOSIS — I10 Essential (primary) hypertension: Secondary | ICD-10-CM | POA: Diagnosis not present

## 2015-02-24 DIAGNOSIS — J209 Acute bronchitis, unspecified: Secondary | ICD-10-CM | POA: Diagnosis not present

## 2015-04-12 ENCOUNTER — Other Ambulatory Visit: Payer: Self-pay | Admitting: Internal Medicine

## 2015-05-19 DIAGNOSIS — Z23 Encounter for immunization: Secondary | ICD-10-CM | POA: Diagnosis not present

## 2015-06-06 ENCOUNTER — Encounter: Payer: Self-pay | Admitting: Internal Medicine

## 2015-06-06 ENCOUNTER — Ambulatory Visit (INDEPENDENT_AMBULATORY_CARE_PROVIDER_SITE_OTHER): Payer: Medicare Other | Admitting: Internal Medicine

## 2015-06-06 VITALS — BP 130/76 | HR 85 | Temp 98.4°F | Resp 20 | Ht 64.0 in | Wt 186.0 lb

## 2015-06-06 DIAGNOSIS — I10 Essential (primary) hypertension: Secondary | ICD-10-CM

## 2015-06-06 MED ORDER — HYDROCHLOROTHIAZIDE 25 MG PO TABS: ORAL_TABLET | ORAL | Status: DC

## 2015-06-06 NOTE — Progress Notes (Signed)
Subjective:    [name], male    DOB: 1941/09/10, 74 y.o.   MRN: [id number]  HPI  BP Readings from Last 3 Encounters:  06/06/15 130/76  07/25/14 134/71  03/21/14 27/36    74 year old patient who has a history of treated hypertension, previously on hydrochlorothiazide.  He was seen at an urgent care a few months ago and lisinopril was added due to elevated blood pressure reading.  For the past 3 months he has had persistent cough.  Past Medical History  Diagnosis Date  . ALLERGIC RHINITIS 07/13/2007  . HYPERLIPIDEMIA 07/13/2007  . HYPERTENSION 07/13/2007  . 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 PEPTIC ULCER DISEASE 07/13/2007  . 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 Marital Status: Legally Separated    Spouse Name: N/A  . Number of Children: N/A  . Years of Education: N/A   Occupational History  . Not on file.   Social History Main Topics  . Smoking status: Never Smoker   . Smokeless tobacco: Never Used  . Alcohol Use: Yes  . Drug Use: No  . Sexual Activity: Not on file   Other Topics Concern  . Not on file   Social History Narrative    Past Surgical History  Procedure Laterality Date  . Rotator cuff repair      No family history on file.  Allergies  Allergen Reactions  . Lisinopril Cough    Cough  . Tramadol Hcl     REACTION: unspecified    Current Outpatient Prescriptions on File Prior to Visit  Medication Sig Dispense Refill  . clonazePAM (KLONOPIN) 0.5 MG tablet TAKE ONE TABLET BY MOUTH TWICE DAILY AS NEEDED FOR ANXIETY 60 tablet 2  . naproxen (NAPROSYN) 500 MG tablet TAKE ONE TABLET BY MOUTH TWICE DAILY WITH A MEAL 60 tablet 2  . tadalafil (CIALIS) 5 MG tablet Take 1 tablet (5 mg total) by mouth daily as needed for erectile dysfunction. 30 tablet 2  . traZODone (DESYREL) 50 MG tablet Take 0.5-1 tablets (25-50 mg total) by mouth at bedtime as needed for sleep. 30 tablet 3   No current  facility-administered medications on file prior to visit.    BP 130/76 mmHg  Pulse 85  Temp(Src) 98.4 F (36.9 C) (Oral)  Resp 20  Ht 5\' 4"  (1.626 m)  Wt 186 lb (84.369 kg)  BMI 31.91 kg/m2  SpO2 96%     Review of Systems  Constitutional: Negative for fever, chills, appetite change and fatigue.  HENT: Negative for congestion, dental problem, ear pain, hearing loss, sore throat, tinnitus, trouble swallowing and voice change.   Eyes: Negative for pain, discharge and visual disturbance.  Respiratory: Positive for cough. Negative for chest tightness, wheezing and stridor.   Cardiovascular: Negative for chest pain, palpitations and leg swelling.  Gastrointestinal: Negative for nausea, vomiting, abdominal pain, diarrhea, constipation, blood in stool and abdominal distention.  Genitourinary: Negative for urgency, hematuria, flank pain, discharge, difficulty urinating and genital sores.  Musculoskeletal: Negative for myalgias, back pain, joint swelling, arthralgias, gait problem and neck stiffness.  Skin: Negative for rash.  Neurological: Negative for dizziness, syncope, speech difficulty, weakness, numbness and headaches.  Hematological: Negative for adenopathy. Does not bruise/bleed easily.  Psychiatric/Behavioral: Negative for behavioral problems and dysphoric mood. The patient is not nervous/anxious.        Objective:   Physical Exam  Constitutional: He appears well-developed and well-nourished. No distress.  Pulmonary/Chest: Effort normal and  breath sounds normal. No respiratory distress. He has no wheezes. He has no rales. He exhibits no tenderness.          Assessment & Plan:   Hypertension Ace induced cough  Will resume hydrochlorothiazide 25 mg daily and lisinopril will be discontinued.  He is scheduled for his annual exam next month.  We'll reassess blood pressure at that time.  Will continue home blood pressure monitoring

## 2015-06-06 NOTE — Patient Instructions (Addendum)
Limit your sodium (Salt) intake  You need to lose weight.  Consider a lower calorie diet and regular exercise.  Please check your blood pressure on a regular basis.  If it is consistently greater than 150/90, please make an office appointment.  Return for your annual exam next month as scheduled

## 2015-06-06 NOTE — Progress Notes (Signed)
Pre visit review using our clinic review tool, if applicable. No additional management support is needed unless otherwise documented below in the visit note. 

## 2015-07-14 ENCOUNTER — Other Ambulatory Visit: Payer: Self-pay | Admitting: Internal Medicine

## 2015-07-28 ENCOUNTER — Other Ambulatory Visit (INDEPENDENT_AMBULATORY_CARE_PROVIDER_SITE_OTHER): Payer: Medicare Other

## 2015-07-28 DIAGNOSIS — R7989 Other specified abnormal findings of blood chemistry: Secondary | ICD-10-CM | POA: Diagnosis not present

## 2015-07-28 DIAGNOSIS — I10 Essential (primary) hypertension: Secondary | ICD-10-CM

## 2015-07-28 DIAGNOSIS — E785 Hyperlipidemia, unspecified: Secondary | ICD-10-CM

## 2015-07-28 DIAGNOSIS — Z0000 Encounter for general adult medical examination without abnormal findings: Secondary | ICD-10-CM

## 2015-07-28 DIAGNOSIS — Z125 Encounter for screening for malignant neoplasm of prostate: Secondary | ICD-10-CM

## 2015-07-28 LAB — HEPATIC FUNCTION PANEL
ALBUMIN: 4.3 g/dL (ref 3.5–5.2)
ALK PHOS: 53 U/L (ref 39–117)
ALT: 22 U/L (ref 0–53)
AST: 16 U/L (ref 0–37)
Bilirubin, Direct: 0.1 mg/dL (ref 0.0–0.3)
Total Bilirubin: 0.5 mg/dL (ref 0.2–1.2)
Total Protein: 6.6 g/dL (ref 6.0–8.3)

## 2015-07-28 LAB — CBC WITH DIFFERENTIAL/PLATELET
BASOS ABS: 0 10*3/uL (ref 0.0–0.1)
Basophils Relative: 0.3 % (ref 0.0–3.0)
EOS PCT: 9.2 % — AB (ref 0.0–5.0)
Eosinophils Absolute: 0.8 10*3/uL — ABNORMAL HIGH (ref 0.0–0.7)
HEMATOCRIT: 43 % (ref 39.0–52.0)
HEMOGLOBIN: 14.9 g/dL (ref 13.0–17.0)
LYMPHS ABS: 1.4 10*3/uL (ref 0.7–4.0)
LYMPHS PCT: 15 % (ref 12.0–46.0)
MCHC: 34.7 g/dL (ref 30.0–36.0)
MCV: 89.7 fl (ref 78.0–100.0)
MONOS PCT: 5.8 % (ref 3.0–12.0)
Monocytes Absolute: 0.5 10*3/uL (ref 0.1–1.0)
NEUTROS PCT: 69.7 % (ref 43.0–77.0)
Neutro Abs: 6.4 10*3/uL (ref 1.4–7.7)
Platelets: 238 10*3/uL (ref 150.0–400.0)
RBC: 4.8 Mil/uL (ref 4.22–5.81)
RDW: 13.7 % (ref 11.5–15.5)
WBC: 9.2 10*3/uL (ref 4.0–10.5)

## 2015-07-28 LAB — POCT URINALYSIS DIPSTICK
BILIRUBIN UA: NEGATIVE
Blood, UA: NEGATIVE
GLUCOSE UA: NEGATIVE
Ketones, UA: NEGATIVE
Leukocytes, UA: NEGATIVE
Nitrite, UA: NEGATIVE
Protein, UA: NEGATIVE
SPEC GRAV UA: 1.025
UROBILINOGEN UA: 0.2
pH, UA: 5.5

## 2015-07-28 LAB — BASIC METABOLIC PANEL
BUN: 20 mg/dL (ref 6–23)
CALCIUM: 9.1 mg/dL (ref 8.4–10.5)
CO2: 28 meq/L (ref 19–32)
CREATININE: 1 mg/dL (ref 0.40–1.50)
Chloride: 102 mEq/L (ref 96–112)
GFR: 77.68 mL/min (ref >60.00)
GLUCOSE: 101 mg/dL — AB (ref 70–99)
Potassium: 3.9 mEq/L (ref 3.5–5.1)
Sodium: 140 mEq/L (ref 135–145)

## 2015-07-28 LAB — LIPID PANEL
CHOL/HDL RATIO: 6
CHOLESTEROL: 187 mg/dL (ref 0–200)
HDL: 33.2 mg/dL — ABNORMAL LOW (ref >39.00)
NONHDL: 153.7
TRIGLYCERIDES: 357 mg/dL — AB (ref 0.0–149.0)
VLDL: 71.4 mg/dL — AB (ref 0.0–40.0)

## 2015-07-28 LAB — TSH: TSH: 2.66 u[IU]/mL (ref 0.35–4.50)

## 2015-07-28 LAB — LDL CHOLESTEROL, DIRECT: Direct LDL: 100 mg/dL

## 2015-07-28 LAB — PSA: PSA: 0.9 ng/mL (ref 0.10–4.00)

## 2015-07-31 ENCOUNTER — Encounter: Payer: Self-pay | Admitting: Internal Medicine

## 2015-07-31 ENCOUNTER — Ambulatory Visit (INDEPENDENT_AMBULATORY_CARE_PROVIDER_SITE_OTHER): Payer: Medicare Other | Admitting: Internal Medicine

## 2015-07-31 VITALS — BP 144/90 | Temp 98.5°F | Ht 63.5 in | Wt 185.0 lb

## 2015-07-31 DIAGNOSIS — Z0000 Encounter for general adult medical examination without abnormal findings: Secondary | ICD-10-CM

## 2015-07-31 DIAGNOSIS — Z23 Encounter for immunization: Secondary | ICD-10-CM

## 2015-07-31 DIAGNOSIS — J3089 Other allergic rhinitis: Secondary | ICD-10-CM

## 2015-07-31 DIAGNOSIS — E785 Hyperlipidemia, unspecified: Secondary | ICD-10-CM

## 2015-07-31 DIAGNOSIS — I10 Essential (primary) hypertension: Secondary | ICD-10-CM

## 2015-07-31 MED ORDER — OMEPRAZOLE 20 MG PO CPDR: 20.0000 mg | DELAYED_RELEASE_CAPSULE | Freq: Every day | ORAL | Status: DC

## 2015-07-31 NOTE — Progress Notes (Signed)
[name], male   DOB: July 14, 1941, 74 y.o.   MRN: [id number]  Subjective:    [name], male    DOB: 1941-02-21, 74 y.o.   MRN: [id number]  HPI 74 year-old patient who is seen today for a wellness exam.  Medical problems include hypertension and allergic rhinitis. He has a remote history of peptic ulcer disease as well as nephrolithiasis. He has had a screening colonoscopy and does get annual eye exams. Laboratory studies and EKG reviewed last year.  Colonoscopy 2013  Past Medical History  Diagnosis Date  . ALLERGIC RHINITIS 07/13/2007  . HYPERLIPIDEMIA 07/13/2007  . HYPERTENSION 07/13/2007  . 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 PEPTIC ULCER DISEASE 07/13/2007  . 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 Rotator cuff repair      reports that he has never smoked. He has never used smokeless tobacco. He reports that he drinks alcohol. He reports that he does not use illicit drugs. family history is not on file. Allergies  Allergen Reactions  . Lisinopril Cough    Cough  . Tramadol Hcl     REACTION: unspecified   1. Risk factors, based on past  M,S,F history-  cardiovascular risk factors include hypertension and mild dyslipidemia  2.  Physical activities: Much more active.  [facility] and now is active with golf again  3.  Depression/mood: No history depression or mood disorder  4.  Hearing: Wears a hearing aid in the right ear  5.  ADL's: Independent in all aspects of daily living 6.  Fall risk: Low  7.  Home safety: No problems identified  8.  Height weight, and visual acuity; height and weight stable no change in visual acuity  9.  Counseling: Modest weight loss and regular exercise encouraged  10. Lab orders based on risk factors: Laboratory profile reviewed  11. Referral : Annual eye exam  12. Care plan: Low salt diet regular exercise modest weight loss all  recommended  13. Cognitive assessment: Alert and oriented normal affect. No cognitive dysfunction  14.  Preventive services will include annual eye and preventative care.  Examinations with screening lab.  Will consider for a final colonoscopy in 7 years  41.  Provider list includes primary care ophthalmology and GI       Review of Systems  Constitutional: Negative for fever, chills, activity change, appetite change and fatigue.  HENT: Negative for congestion, dental problem, ear pain, hearing loss, mouth sores, rhinorrhea, sinus pressure, sneezing, tinnitus, trouble swallowing and voice change.   Eyes: Negative for photophobia, pain, redness and visual disturbance.  Respiratory: Negative for apnea, cough, choking, chest tightness, shortness of breath and wheezing.   Cardiovascular: Negative for chest pain, palpitations and leg swelling.  Gastrointestinal: Negative for nausea, vomiting, abdominal pain, diarrhea, constipation, blood in stool, abdominal distention, anal bleeding and rectal pain.  Genitourinary: Negative for dysuria, urgency, frequency, hematuria, flank pain, decreased urine volume, discharge, penile swelling, scrotal swelling, difficulty urinating, genital sores and testicular pain.  Musculoskeletal: Negative for myalgias, back pain, joint swelling, arthralgias, gait problem, neck pain and neck stiffness.  Skin: Negative for color change, rash and wound.  Neurological: Negative for dizziness, tremors, seizures, syncope, facial asymmetry, speech difficulty, weakness, light-headedness, numbness and headaches.  Hematological: Negative for adenopathy. Does not bruise/bleed easily.  Psychiatric/Behavioral: Negative for suicidal ideas, hallucinations, behavioral problems, confusion, sleep disturbance, self-injury, dysphoric mood, decreased concentration and agitation.  The patient is not nervous/anxious.        Objective:   Physical Exam  Constitutional: He appears  well-developed and well-nourished.  HENT:  Head: Normocephalic and atraumatic.  Right Ear: External ear normal.  Left Ear: External ear normal.  Nose: Nose normal.  Mouth/Throat: Oropharynx is clear and moist.  Eyes: Conjunctivae and EOM are normal. Pupils are equal, round, and reactive to light. No scleral icterus.  Neck: Normal range of motion. Neck supple. No JVD present. No thyromegaly present.  Cardiovascular: Normal heart sounds and intact distal pulses.  Exam reveals no gallop and no friction rub.   No murmur heard. The left dorsalis pedis pulse, slightly diminished.  The left posterior tibial pulse absent Occasional ectopics  Pulmonary/Chest: Effort normal and breath sounds normal. He exhibits no tenderness.  Abdominal: Soft. Bowel sounds are normal. He exhibits no distension and no mass. There is no tenderness.  Genitourinary: Penis normal.  Prostate +2 enlarged  Musculoskeletal: Normal range of motion. He exhibits no edema or tenderness.  Lymphadenopathy:    He has no cervical adenopathy.  Neurological: He is alert. He has normal reflexes. No cranial nerve deficit. Coordination normal.  Skin: Skin is warm and dry. No rash noted.  Psychiatric: He has a normal mood and affect. His behavior is normal. Judgment and thought content normal.          Assessment & Plan:    Annual health assessment Hypertension stable Mild dyslipidemia Mild exogenous obesity, moderate weight loss encouraged  Low salt low calorie diet and coverage. Regular exercise also encouraged. 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

## 2015-07-31 NOTE — Progress Notes (Signed)
Pre visit review using our clinic review tool, if applicable. No additional management support is needed unless otherwise documented below in the visit note. 

## 2015-10-31 DIAGNOSIS — H52222 Regular astigmatism, left eye: Secondary | ICD-10-CM | POA: Diagnosis not present

## 2015-10-31 DIAGNOSIS — H40003 Preglaucoma, unspecified, bilateral: Secondary | ICD-10-CM | POA: Diagnosis not present

## 2015-10-31 DIAGNOSIS — H5203 Hypermetropia, bilateral: Secondary | ICD-10-CM | POA: Diagnosis not present

## 2015-10-31 DIAGNOSIS — H2513 Age-related nuclear cataract, bilateral: Secondary | ICD-10-CM | POA: Diagnosis not present

## 2015-11-25 ENCOUNTER — Other Ambulatory Visit: Payer: Self-pay | Admitting: Internal Medicine

## 2015-12-08 DIAGNOSIS — H2513 Age-related nuclear cataract, bilateral: Secondary | ICD-10-CM | POA: Diagnosis not present

## 2015-12-08 DIAGNOSIS — H35033 Hypertensive retinopathy, bilateral: Secondary | ICD-10-CM | POA: Diagnosis not present

## 2015-12-08 DIAGNOSIS — H40013 Open angle with borderline findings, low risk, bilateral: Secondary | ICD-10-CM | POA: Diagnosis not present

## 2015-12-08 DIAGNOSIS — H524 Presbyopia: Secondary | ICD-10-CM | POA: Diagnosis not present

## 2016-01-19 ENCOUNTER — Ambulatory Visit (INDEPENDENT_AMBULATORY_CARE_PROVIDER_SITE_OTHER): Payer: Medicare Other | Admitting: Internal Medicine

## 2016-01-19 ENCOUNTER — Encounter: Payer: Self-pay | Admitting: Internal Medicine

## 2016-01-19 VITALS — BP 170/100 | HR 74 | Temp 98.0°F | Resp 20 | Ht 63.5 in | Wt 184.0 lb

## 2016-01-19 DIAGNOSIS — E785 Hyperlipidemia, unspecified: Secondary | ICD-10-CM | POA: Diagnosis not present

## 2016-01-19 DIAGNOSIS — R0602 Shortness of breath: Secondary | ICD-10-CM

## 2016-01-19 DIAGNOSIS — R0789 Other chest pain: Secondary | ICD-10-CM

## 2016-01-19 DIAGNOSIS — I10 Essential (primary) hypertension: Secondary | ICD-10-CM | POA: Diagnosis not present

## 2016-01-19 MED ORDER — LOSARTAN POTASSIUM-HCTZ 100-25 MG PO TABS: 1.0000 | ORAL_TABLET | Freq: Every day | ORAL | Status: DC

## 2016-01-19 NOTE — Patient Instructions (Signed)
Limit your sodium (Salt) intake  Please check your blood pressure on a regular basis.  If it is consistently greater than 150/90, please make an office appointment.  Return in two weeks for follow-up  Call if you have any recurrent chest pain  Cardiac stress test as discussed

## 2016-01-19 NOTE — Progress Notes (Signed)
Pre visit review using our clinic review tool, if applicable. No additional management support is needed unless otherwise documented below in the visit note. 

## 2016-01-19 NOTE — Progress Notes (Signed)
Subjective:    [name], male    DOB: 1941-09-13, 75 y.o.   MRN: [id number]  HPI  75 year old patient who has a long history of dyslipidemia and essential hypertension.  For the past 2 weeks he has had elevated blood pressure readings consistently.  Over this period of time.  He has noted worsening dyspnea on exertion.  Yesterday he had some mild chest tightness that occurred at rest watching television.  For the past couple days.  He is also had some nonspecific dizziness , headaches and mild nonproductive cough  EKG was obtained that revealed a normal sinus rhythm.  Poor R-wave progression.  Nonspecific QRS widening.  Nonspecific T-wave flattening.  Basically unchanged from prior tracing  Past Medical History  Diagnosis Date  . ALLERGIC RHINITIS 07/13/2007  . HYPERLIPIDEMIA 07/13/2007  . HYPERTENSION 07/13/2007  . 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 PEPTIC ULCER DISEASE 07/13/2007  . 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 Marital Status: Legally Separated    Spouse Name: N/A  . Number of Children: N/A  . Years of Education: N/A   Occupational History  . Not on file.   Social History Main Topics  . Smoking status: Never Smoker   . Smokeless tobacco: Never Used  . Alcohol Use: Yes  . Drug Use: No  . Sexual Activity: Not on file   Other Topics Concern  . Not on file   Social History Narrative    Past Surgical History  Procedure Laterality Date  . Rotator cuff repair      No family history on file.  Allergies  Allergen Reactions  . Lisinopril Cough    Cough  . Tramadol Hcl     REACTION: unspecified    Current Outpatient Prescriptions on File Prior to Visit  Medication Sig Dispense Refill  . clonazePAM (KLONOPIN) 0.5 MG tablet TAKE ONE TABLET BY MOUTH TWICE DAILY AS NEEDED FOR ANXIETY 60 tablet 2  . fluticasone (FLONASE) 50 MCG/ACT nasal spray     . hydrochlorothiazide (HYDRODIURIL) 25 MG tablet TAKE  ONE TABLET BY MOUTH ONCE DAILY 90 tablet 1  . levocetirizine (XYZAL) 5 MG tablet     . naproxen (NAPROSYN) 500 MG tablet TAKE ONE TABLET BY MOUTH TWICE DAILY WITH A MEAL 60 tablet 2  . omeprazole (PRILOSEC) 20 MG capsule Take 1 capsule (20 mg total) by mouth daily. 90 capsule 3  . tadalafil (CIALIS) 5 MG tablet Take 1 tablet (5 mg total) by mouth daily as needed for erectile dysfunction. 30 tablet 2  . traZODone (DESYREL) 50 MG tablet Take 0.5-1 tablets (25-50 mg total) by mouth at bedtime as needed for sleep. 30 tablet 3   No current facility-administered medications on file prior to visit.    BP 170/100 mmHg  Pulse 74  Temp(Src) 98 F (36.7 C) (Oral)  Resp 20  Ht 5' 3.5" (1.613 m)  Wt 184 lb (83.462 kg)  BMI 32.08 kg/m2  SpO2 96%     Review of Systems  Constitutional: Negative for fever, chills, appetite change and fatigue.  HENT: Negative for congestion, dental problem, ear pain, hearing loss, sore throat, tinnitus, trouble swallowing and voice change.   Eyes: Negative for pain, discharge and visual disturbance.  Respiratory: Positive for cough, chest tightness and shortness of breath. Negative for wheezing and stridor.   Cardiovascular: Negative for chest pain, palpitations and leg swelling.  Gastrointestinal: Negative for nausea, vomiting, abdominal pain, diarrhea, constipation,  blood in stool and abdominal distention.  Genitourinary: Negative for urgency, hematuria, flank pain, discharge, difficulty urinating and genital sores.  Musculoskeletal: Negative for myalgias, back pain, joint swelling, arthralgias, gait problem and neck stiffness.  Skin: Negative for rash.  Neurological: Positive for dizziness and headaches. Negative for syncope, speech difficulty, weakness and numbness.  Hematological: Negative for adenopathy. Does not bruise/bleed easily.  Psychiatric/Behavioral: Negative for behavioral problems and dysphoric mood. The patient is not nervous/anxious.          Objective:   Physical Exam  Constitutional: He is oriented to person, place, and time. He appears well-developed.  Blood pressure 160/98  HENT:  Head: Normocephalic.  Right Ear: External ear normal.  Left Ear: External ear normal.  Eyes: Conjunctivae and EOM are normal.  Neck: Normal range of motion.  Cardiovascular: Normal rate and normal heart sounds.   Pulmonary/Chest: Breath sounds normal.  Abdominal: Bowel sounds are normal.  Musculoskeletal: Normal range of motion. He exhibits no edema or tenderness.  Neurological: He is alert and oriented to person, place, and time.  Psychiatric: He has a normal mood and affect. His behavior is normal.          Assessment & Plan:   Hypertension.  Suboptimal control.  Will add losartan to diuretic regimen.  Recheck 2 weeks Dyspnea on exertion.  This has been present for 2 weeks.  Blood pressures also been quite elevated over this period time.  Concerned about possible angina equivalent.  Yesterday the patient had mild chest tightness that occurred at rest.  Will schedule nuclear stress test

## 2016-01-20 ENCOUNTER — Ambulatory Visit (HOSPITAL_COMMUNITY): Payer: Medicare Other | Attending: Internal Medicine

## 2016-01-20 DIAGNOSIS — R9439 Abnormal result of other cardiovascular function study: Secondary | ICD-10-CM | POA: Diagnosis not present

## 2016-01-20 DIAGNOSIS — R0602 Shortness of breath: Secondary | ICD-10-CM | POA: Diagnosis not present

## 2016-01-20 DIAGNOSIS — R0789 Other chest pain: Secondary | ICD-10-CM | POA: Diagnosis not present

## 2016-01-20 DIAGNOSIS — I10 Essential (primary) hypertension: Secondary | ICD-10-CM | POA: Insufficient documentation

## 2016-01-20 DIAGNOSIS — R0609 Other forms of dyspnea: Secondary | ICD-10-CM | POA: Diagnosis not present

## 2016-01-20 LAB — MYOCARDIAL PERFUSION IMAGING
CHL CUP MPHR: 146 {beats}/min
CHL CUP NUCLEAR SDS: 3
CHL CUP NUCLEAR SRS: 3
CHL CUP RESTING HR STRESS: 69 {beats}/min
CHL RATE OF PERCEIVED EXERTION: 17
CSEPEDS: 42 s
CSEPEW: 7 METS
CSEPHR: 87 %
Exercise duration (min): 5 min
LV sys vol: 60 mL
LVDIAVOL: 108 mL
Peak HR: 127 {beats}/min
RATE: 0.34
SSS: 6
TID: 1.1

## 2016-01-20 MED ORDER — TECHNETIUM TC 99M SESTAMIBI GENERIC - CARDIOLITE
10.3000 | Freq: Once | INTRAVENOUS | Status: AC | PRN
  Administered 2016-01-20: 10 via INTRAVENOUS

## 2016-01-20 MED ORDER — TECHNETIUM TC 99M SESTAMIBI GENERIC - CARDIOLITE
33.0000 | Freq: Once | INTRAVENOUS | Status: AC | PRN
  Administered 2016-01-20: 33 via INTRAVENOUS

## 2016-01-21 ENCOUNTER — Other Ambulatory Visit: Payer: Self-pay | Admitting: Internal Medicine

## 2016-01-21 DIAGNOSIS — I10 Essential (primary) hypertension: Secondary | ICD-10-CM

## 2016-01-21 DIAGNOSIS — R9439 Abnormal result of other cardiovascular function study: Secondary | ICD-10-CM

## 2016-01-22 ENCOUNTER — Other Ambulatory Visit (HOSPITAL_COMMUNITY): Payer: Self-pay

## 2016-01-22 DIAGNOSIS — H04212 Epiphora due to excess lacrimation, left lacrimal gland: Secondary | ICD-10-CM | POA: Diagnosis not present

## 2016-01-22 DIAGNOSIS — H401111 Primary open-angle glaucoma, right eye, mild stage: Secondary | ICD-10-CM | POA: Diagnosis not present

## 2016-01-22 DIAGNOSIS — H401122 Primary open-angle glaucoma, left eye, moderate stage: Secondary | ICD-10-CM | POA: Diagnosis not present

## 2016-02-02 ENCOUNTER — Ambulatory Visit (INDEPENDENT_AMBULATORY_CARE_PROVIDER_SITE_OTHER): Payer: Medicare Other | Admitting: Internal Medicine

## 2016-02-02 ENCOUNTER — Encounter: Payer: Self-pay | Admitting: Internal Medicine

## 2016-02-02 VITALS — BP 136/78 | HR 76 | Temp 98.5°F | Resp 20 | Ht 63.5 in | Wt 185.0 lb

## 2016-02-02 DIAGNOSIS — I10 Essential (primary) hypertension: Secondary | ICD-10-CM

## 2016-02-02 DIAGNOSIS — J301 Allergic rhinitis due to pollen: Secondary | ICD-10-CM

## 2016-02-02 DIAGNOSIS — E785 Hyperlipidemia, unspecified: Secondary | ICD-10-CM | POA: Diagnosis not present

## 2016-02-02 MED ORDER — FEXOFENADINE HCL 180 MG PO TABS: 180.0000 mg | ORAL_TABLET | Freq: Every day | ORAL | Status: DC

## 2016-02-02 MED ORDER — FLUTICASONE PROPIONATE 50 MCG/ACT NA SUSP: 2.0000 | Freq: Every day | NASAL | Status: DC

## 2016-02-02 NOTE — Patient Instructions (Signed)
Limit your sodium (Salt) intake  2-D echocardiogram as scheduled  Please check your blood pressure on a regular basis.  If it is consistently greater than 150/90, please make an office appointment.  Return in 6 months for follow-up

## 2016-02-02 NOTE — Progress Notes (Signed)
Pre visit review using our clinic review tool, if applicable. No additional management support is needed unless otherwise documented below in the visit note. 

## 2016-02-02 NOTE — Progress Notes (Signed)
Subjective:    [name], male    DOB: [birth year], 75 y.o.   MRN: [id number]  HPI  75 year old patient who is seen today in follow-up.  He was seen about 2 weeks ago with elevated blood pressure and history of intermittent chest pain and some DOE.  A nuclear stress test was performed that revealed a depressed ejection fraction, but no reversible perfusion defects.  He has had no recurrent symptoms He is scheduled for a 2-D echocardiogram later this week  He does have some seasonal allergy symptoms  Past Medical History  Diagnosis Date  . ALLERGIC RHINITIS 07/13/2007  . HYPERLIPIDEMIA 07/13/2007  . HYPERTENSION 07/13/2007  . 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 PEPTIC ULCER DISEASE 07/13/2007  . 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 Marital Status: Legally Separated    Spouse Name: N/A  . Number of Children: N/A  . Years of Education: N/A   Occupational History  . Not on file.   Social History Main Topics  . Smoking status: Never Smoker   . Smokeless tobacco: Never Used  . Alcohol Use: Yes  . Drug Use: No  . Sexual Activity: Not on file   Other Topics Concern  . Not on file   Social History Narrative    Past Surgical History  Procedure Laterality Date  . Rotator cuff repair      No family history on file.  Allergies  Allergen Reactions  . Lisinopril Cough    Cough  . Tramadol Hcl     REACTION: unspecified    Current Outpatient Prescriptions on File Prior to Visit  Medication Sig Dispense Refill  . clonazePAM (KLONOPIN) 0.5 MG tablet TAKE ONE TABLET BY MOUTH TWICE DAILY AS NEEDED FOR ANXIETY 60 tablet 2  . fluticasone (FLONASE) 50 MCG/ACT nasal spray     . latanoprost (XALATAN) 0.005 % ophthalmic solution Place 1 drop into the left eye at bedtime.     Marland Kitchen levocetirizine (XYZAL) 5 MG tablet     . losartan-hydrochlorothiazide (HYZAAR) 100-25 MG tablet Take 1 tablet by mouth daily. 60 tablet 3  .  naproxen (NAPROSYN) 500 MG tablet TAKE ONE TABLET BY MOUTH TWICE DAILY WITH A MEAL 60 tablet 2  . omeprazole (PRILOSEC) 20 MG capsule Take 1 capsule (20 mg total) by mouth daily. 90 capsule 3  . tadalafil (CIALIS) 5 MG tablet Take 1 tablet (5 mg total) by mouth daily as needed for erectile dysfunction. 30 tablet 2   No current facility-administered medications on file prior to visit.    BP 136/78 mmHg  Pulse 76  Temp(Src) 98.5 F (36.9 C) (Oral)  Resp 20  Ht 5' 3.5" (1.613 m)  Wt 185 lb (83.915 kg)  BMI 32.25 kg/m2  SpO2 98%     Review of Systems  Constitutional: Negative for fever, chills, appetite change and fatigue.  HENT: Positive for congestion and postnasal drip. Negative for dental problem, ear pain, hearing loss, sore throat, tinnitus, trouble swallowing and voice change.   Eyes: Positive for itching. Negative for pain, discharge and visual disturbance.  Respiratory: Positive for cough. Negative for chest tightness, wheezing and stridor.   Cardiovascular: Negative for chest pain, palpitations and leg swelling.  Gastrointestinal: Negative for nausea, vomiting, abdominal pain, diarrhea, constipation, blood in stool and abdominal distention.  Genitourinary: Negative for urgency, hematuria, flank pain, discharge, difficulty urinating and genital sores.  Musculoskeletal: Negative for myalgias, back pain, joint swelling, arthralgias, gait  problem and neck stiffness.  Skin: Negative for rash.  Neurological: Negative for dizziness, syncope, speech difficulty, weakness, numbness and headaches.  Hematological: Negative for adenopathy. Does not bruise/bleed easily.  Psychiatric/Behavioral: Negative for behavioral problems and dysphoric mood. The patient is not nervous/anxious.        Objective:   Physical Exam  Constitutional: He is oriented to person, place, and time. He appears well-developed.  Blood pressure 130/80  HENT:  Head: Normocephalic.  Right Ear: External ear  normal.  Left Ear: External ear normal.  Mild conjunctival injection  Eyes: Conjunctivae and EOM are normal.  Neck: Normal range of motion.  Cardiovascular: Normal rate, regular rhythm and normal heart sounds.   Pulmonary/Chest: Breath sounds normal. No respiratory distress. He has no wheezes. He has no rales.  Abdominal: Bowel sounds are normal.  Musculoskeletal: Normal range of motion. He exhibits no edema or tenderness.  Neurological: He is alert and oriented to person, place, and time.  Psychiatric: He has a normal mood and affect. His behavior is normal.          Assessment & Plan:   Hypertension, improved History of the chest pain and DOE.  No further symptoms.  Nuclear stress test revealed no reversible perfusion changes Rule out LV dysfunction.  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  Will review 2-D echo

## 2016-02-05 ENCOUNTER — Ambulatory Visit (HOSPITAL_COMMUNITY): Payer: Medicare Other | Attending: Internal Medicine

## 2016-02-05 ENCOUNTER — Other Ambulatory Visit: Payer: Self-pay

## 2016-02-05 DIAGNOSIS — I119 Hypertensive heart disease without heart failure: Secondary | ICD-10-CM | POA: Insufficient documentation

## 2016-02-05 DIAGNOSIS — I10 Essential (primary) hypertension: Secondary | ICD-10-CM | POA: Diagnosis not present

## 2016-02-05 DIAGNOSIS — E785 Hyperlipidemia, unspecified: Secondary | ICD-10-CM | POA: Insufficient documentation

## 2016-02-05 DIAGNOSIS — I340 Nonrheumatic mitral (valve) insufficiency: Secondary | ICD-10-CM | POA: Insufficient documentation

## 2016-02-05 DIAGNOSIS — R931 Abnormal findings on diagnostic imaging of heart and coronary circulation: Secondary | ICD-10-CM | POA: Diagnosis not present

## 2016-02-05 DIAGNOSIS — I351 Nonrheumatic aortic (valve) insufficiency: Secondary | ICD-10-CM | POA: Insufficient documentation

## 2016-02-05 DIAGNOSIS — R9439 Abnormal result of other cardiovascular function study: Secondary | ICD-10-CM

## 2016-02-05 LAB — ECHOCARDIOGRAM COMPLETE
EWDT: 158 ms
FS: 22 % — AB (ref 28–44)
IVS/LV PW RATIO, ED: 0.81
LDCA: 5.31 cm2
LEFT ATRIUM END SYS DIAM: 43 cm
LVOTPV: 93.8 m/s
MV Peak grad: 2 mmHg
MVPKAVEL: 94.1 m/s
MVPKEVEL: 76.2 m/s
P 1/2 time: 965 ms
PW: 9.43 mm — AB (ref 0.6–1.1)
Stroke v: 108 ml
VTI: 20.3 cm

## 2016-02-11 ENCOUNTER — Other Ambulatory Visit: Payer: Self-pay | Admitting: Internal Medicine

## 2016-03-08 DIAGNOSIS — H401122 Primary open-angle glaucoma, left eye, moderate stage: Secondary | ICD-10-CM | POA: Diagnosis not present

## 2016-03-09 ENCOUNTER — Other Ambulatory Visit: Payer: Self-pay | Admitting: General Practice

## 2016-03-09 MED ORDER — LOSARTAN POTASSIUM-HCTZ 100-25 MG PO TABS: 1.0000 | ORAL_TABLET | Freq: Every day | ORAL | Status: DC

## 2016-04-19 DIAGNOSIS — H401122 Primary open-angle glaucoma, left eye, moderate stage: Secondary | ICD-10-CM | POA: Diagnosis not present

## 2016-04-19 DIAGNOSIS — H401111 Primary open-angle glaucoma, right eye, mild stage: Secondary | ICD-10-CM | POA: Diagnosis not present

## 2016-05-12 ENCOUNTER — Other Ambulatory Visit: Payer: Self-pay | Admitting: Internal Medicine

## 2016-07-01 ENCOUNTER — Ambulatory Visit (INDEPENDENT_AMBULATORY_CARE_PROVIDER_SITE_OTHER): Payer: 59 | Admitting: Psychiatry

## 2016-07-01 DIAGNOSIS — F10182 Alcohol abuse with alcohol-induced sleep disorder: Secondary | ICD-10-CM | POA: Diagnosis not present

## 2016-07-01 DIAGNOSIS — F419 Anxiety disorder, unspecified: Secondary | ICD-10-CM

## 2016-07-14 ENCOUNTER — Other Ambulatory Visit: Payer: Self-pay | Admitting: Internal Medicine

## 2016-07-14 NOTE — Telephone Encounter (Signed)
Okay to refill? 

## 2016-07-20 DIAGNOSIS — H5319 Other subjective visual disturbances: Secondary | ICD-10-CM | POA: Diagnosis not present

## 2016-07-20 DIAGNOSIS — H43811 Vitreous degeneration, right eye: Secondary | ICD-10-CM | POA: Diagnosis not present

## 2016-07-26 DIAGNOSIS — R140 Abdominal distension (gaseous): Secondary | ICD-10-CM | POA: Diagnosis not present

## 2016-07-26 DIAGNOSIS — R143 Flatulence: Secondary | ICD-10-CM | POA: Diagnosis not present

## 2016-07-26 DIAGNOSIS — K210 Gastro-esophageal reflux disease with esophagitis: Secondary | ICD-10-CM | POA: Diagnosis not present

## 2016-07-29 ENCOUNTER — Other Ambulatory Visit: Payer: Self-pay | Admitting: Gastroenterology

## 2016-07-29 DIAGNOSIS — R1084 Generalized abdominal pain: Secondary | ICD-10-CM

## 2016-08-12 ENCOUNTER — Ambulatory Visit
Admission: RE | Admit: 2016-08-12 | Discharge: 2016-08-12 | Disposition: A | Discharge status: Home / Self Care | Payer: Medicare Other | Source: Ambulatory Visit | Attending: Gastroenterology | Admitting: Gastroenterology

## 2016-08-12 DIAGNOSIS — K760 Fatty (change of) liver, not elsewhere classified: Secondary | ICD-10-CM | POA: Diagnosis not present

## 2016-08-12 DIAGNOSIS — R1084 Generalized abdominal pain: Secondary | ICD-10-CM

## 2016-09-07 ENCOUNTER — Other Ambulatory Visit: Payer: Self-pay | Admitting: Internal Medicine

## 2016-09-08 ENCOUNTER — Telehealth: Payer: Self-pay | Admitting: Internal Medicine

## 2016-09-08 NOTE — Telephone Encounter (Signed)
Pt would like for David Goodman to call so that he can ask if Dr. Raliegh Ip will give him a letter stating that he has high Bp due to him wanting to get assistance and need to have proof of his medical condition.

## 2016-09-13 NOTE — Telephone Encounter (Signed)
Spoke to pt, told him needs to schedule appointment was to be seen in Sept. Can discuss letter you need at visit. Pt verbalized understanding and will call and schedule.

## 2016-09-20 ENCOUNTER — Other Ambulatory Visit: Payer: Self-pay

## 2016-09-21 ENCOUNTER — Other Ambulatory Visit (INDEPENDENT_AMBULATORY_CARE_PROVIDER_SITE_OTHER): Payer: Medicare Other

## 2016-09-21 DIAGNOSIS — Z0000 Encounter for general adult medical examination without abnormal findings: Secondary | ICD-10-CM | POA: Diagnosis not present

## 2016-09-21 DIAGNOSIS — R7989 Other specified abnormal findings of blood chemistry: Secondary | ICD-10-CM

## 2016-09-21 DIAGNOSIS — E349 Endocrine disorder, unspecified: Secondary | ICD-10-CM

## 2016-09-21 LAB — CBC WITH DIFFERENTIAL/PLATELET
BASOS ABS: 0 10*3/uL (ref 0.0–0.1)
BASOS PCT: 0.3 % (ref 0.0–3.0)
EOS ABS: 0.7 10*3/uL (ref 0.0–0.7)
Eosinophils Relative: 11 % — ABNORMAL HIGH (ref 0.0–5.0)
HEMATOCRIT: 42.3 % (ref 39.0–52.0)
Hemoglobin: 14.5 g/dL (ref 13.0–17.0)
LYMPHS ABS: 1.4 10*3/uL (ref 0.7–4.0)
LYMPHS PCT: 20.3 % (ref 12.0–46.0)
MCHC: 34.2 g/dL (ref 30.0–36.0)
MCV: 90.5 fl (ref 78.0–100.0)
MONOS PCT: 6.4 % (ref 3.0–12.0)
Monocytes Absolute: 0.4 10*3/uL (ref 0.1–1.0)
NEUTROS ABS: 4.2 10*3/uL (ref 1.4–7.7)
NEUTROS PCT: 62 % (ref 43.0–77.0)
PLATELETS: 257 10*3/uL (ref 150.0–400.0)
RBC: 4.68 Mil/uL (ref 4.22–5.81)
RDW: 13.2 % (ref 11.5–15.5)
WBC: 6.8 10*3/uL (ref 4.0–10.5)

## 2016-09-21 LAB — BASIC METABOLIC PANEL
BUN: 27 mg/dL — ABNORMAL HIGH (ref 6–23)
CALCIUM: 9 mg/dL (ref 8.4–10.5)
CHLORIDE: 103 meq/L (ref 96–112)
CO2: 28 meq/L (ref 19–32)
CREATININE: 1.04 mg/dL (ref 0.40–1.50)
GFR: 74.01 mL/min (ref >60.00)
GLUCOSE: 116 mg/dL — AB (ref 70–99)
Potassium: 3.6 mEq/L (ref 3.5–5.1)
Sodium: 139 mEq/L (ref 135–145)

## 2016-09-21 LAB — TESTOSTERONE: Testosterone: 212.68 ng/dL — ABNORMAL LOW (ref 300.00–890.00)

## 2016-09-21 LAB — POC URINALSYSI DIPSTICK (AUTOMATED)
BILIRUBIN UA: NEGATIVE
Blood, UA: NEGATIVE
GLUCOSE UA: NEGATIVE
KETONES UA: NEGATIVE
LEUKOCYTES UA: NEGATIVE
NITRITE UA: NEGATIVE
PH UA: 6
Protein, UA: NEGATIVE
Spec Grav, UA: 1.015
Urobilinogen, UA: 0.2

## 2016-09-21 LAB — LIPID PANEL
Cholesterol: 197 mg/dL (ref 0–200)
HDL: 33.3 mg/dL — AB (ref >39.00)
NonHDL: 163.27
TRIGLYCERIDES: 368 mg/dL — AB (ref 0.0–149.0)
Total CHOL/HDL Ratio: 6
VLDL: 73.6 mg/dL — AB (ref 0.0–40.0)

## 2016-09-21 LAB — HEPATIC FUNCTION PANEL
ALBUMIN: 4.4 g/dL (ref 3.5–5.2)
ALT: 25 U/L (ref 0–53)
AST: 17 U/L (ref 0–37)
Alkaline Phosphatase: 49 U/L (ref 39–117)
Bilirubin, Direct: 0.1 mg/dL (ref 0.0–0.3)
TOTAL PROTEIN: 6.8 g/dL (ref 6.0–8.3)
Total Bilirubin: 0.5 mg/dL (ref 0.2–1.2)

## 2016-09-21 LAB — TSH: TSH: 2.4 u[IU]/mL (ref 0.35–4.50)

## 2016-09-21 LAB — LDL CHOLESTEROL, DIRECT: LDL DIRECT: 119 mg/dL

## 2016-09-27 ENCOUNTER — Encounter: Payer: Self-pay | Admitting: Internal Medicine

## 2016-09-27 ENCOUNTER — Ambulatory Visit (INDEPENDENT_AMBULATORY_CARE_PROVIDER_SITE_OTHER): Payer: Medicare Other | Admitting: Internal Medicine

## 2016-09-27 VITALS — BP 130/70 | HR 68 | Temp 98.3°F | Ht 63.75 in | Wt 187.2 lb

## 2016-09-27 DIAGNOSIS — K219 Gastro-esophageal reflux disease without esophagitis: Secondary | ICD-10-CM | POA: Insufficient documentation

## 2016-09-27 DIAGNOSIS — Z0000 Encounter for general adult medical examination without abnormal findings: Secondary | ICD-10-CM

## 2016-09-27 DIAGNOSIS — I519 Heart disease, unspecified: Secondary | ICD-10-CM | POA: Insufficient documentation

## 2016-09-27 DIAGNOSIS — R7302 Impaired glucose tolerance (oral): Secondary | ICD-10-CM | POA: Insufficient documentation

## 2016-09-27 DIAGNOSIS — Z23 Encounter for immunization: Secondary | ICD-10-CM | POA: Diagnosis not present

## 2016-09-27 HISTORY — DX: Impaired glucose tolerance (oral): R73.02

## 2016-09-27 MED ORDER — TESTOSTERONE 20.25 MG/ACT (1.62%) TD GEL: 2.0000 | Freq: Every morning | TRANSDERMAL | 5 refills | Status: DC

## 2016-09-27 NOTE — Progress Notes (Signed)
[name], male   DOB: 11-11-1941, 75 y.o.   MRN: [id number]  Subjective:    [name], male    DOB: Sep 19, 1941, 75 y.o.   MRN: [id number]  HPI 75  year-old patient who is seen today for a wellness exam.  Medical problems include hypertension and allergic rhinitis. He has a remote history of peptic ulcer disease as well as nephrolithiasis. He has had a screening colonoscopy and does get annual eye exams  Earlier this year, the patient give a history of some DOE and some nonspecific chest discomfort.  A nuclear stress test was performed and revealed no perfusion abnormalities, but suggested mild impaired LV function.  EKG also revealed ejection fraction of 45-50% with some hypokinesis. The patient presently has no cardiac symptoms.  [facility] and has no exercise limitations.  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  He has a long history of some ED and has responded well to Cialis for the past month and a half.  This has not been effective.  Laboratory screen did reveal a low testosterone level     Colonoscopy 2013  Past Medical History:  Diagnosis Date  . ALLERGIC RHINITIS 07/13/2007  . HYPERLIPIDEMIA 07/13/2007  . HYPERTENSION 07/13/2007  . 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 PEPTIC ULCER DISEASE 07/13/2007  . 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 ROTATOR CUFF REPAIR      reports that he has never smoked. He has never used smokeless tobacco. He reports that he drinks alcohol. He reports that he does not use drugs. family history is not on file. Allergies  Allergen Reactions  . Lisinopril Cough    Cough  . Tramadol Hcl     REACTION: unspecified   Medicare wellness  1. Risk factors, based on past  M,S,F history-  cardiovascular risk factors include hypertension and mild dyslipidemia  2.  Physical activities: Much more  active.  [facility] and walks frequently   3.  Depression/mood: No history depression or mood disorder  4.  Hearing: Wears a hearing aid in the right ear  5.  ADL's: Independent in all aspects of daily living 6.  Fall risk: Low  7.  Home safety: No problems identified  8.  Height weight, and visual acuity; height and weight stable no change in visual acuity  9.  Counseling: Modest weight loss and regular exercise encouraged  10. Lab orders based on risk factors: Laboratory profile reviewed  11. Referral : Annual eye exam  12. Care plan: Low salt diet regular exercise modest weight loss all recommended  13. Cognitive assessment: Alert and oriented normal affect. No cognitive dysfunction  14.  Preventive services will include annual eye and preventative care.  Examinations with screening lab.  Will consider for a final colonoscopy in 7 years  51.  Provider list includes primary care ophthalmology and GI       Review of Systems  Constitutional: Negative for activity change, appetite change, chills, fatigue and fever.  HENT: Negative for congestion, dental problem, ear pain, hearing loss, mouth sores, rhinorrhea, sinus pressure, sneezing, tinnitus, trouble swallowing and voice change.   Eyes: Negative for photophobia, pain, redness and visual disturbance.  Respiratory: Negative for apnea, cough, choking, chest tightness, shortness of breath and wheezing.   Cardiovascular: Negative for chest pain, palpitations and  leg swelling.  Gastrointestinal: Negative for abdominal distention, abdominal pain, anal bleeding, blood in stool, constipation, diarrhea, nausea, rectal pain and vomiting.  Genitourinary: Negative for decreased urine volume, difficulty urinating, discharge, dysuria, flank pain, frequency, genital sores, hematuria, penile swelling, scrotal swelling, testicular pain and urgency.  Musculoskeletal: Negative for arthralgias, back pain, gait problem, joint  swelling, myalgias, neck pain and neck stiffness.  Skin: Negative for color change, rash and wound.  Neurological: Negative for dizziness, tremors, seizures, syncope, facial asymmetry, speech difficulty, weakness, light-headedness, numbness and headaches.  Hematological: Negative for adenopathy. Does not bruise/bleed easily.  Psychiatric/Behavioral: Negative for agitation, behavioral problems, confusion, decreased concentration, dysphoric mood, hallucinations, self-injury, sleep disturbance and suicidal ideas. The patient is not nervous/anxious.        Objective:   Physical Exam  Constitutional: He appears well-developed and well-nourished.  HENT:  Head: Normocephalic and atraumatic.  Right Ear: External ear normal.  Left Ear: External ear normal.  Nose: Nose normal.  Mouth/Throat: Oropharynx is clear and moist.  Eyes: Conjunctivae and EOM are normal. Pupils are equal, round, and reactive to light. No scleral icterus.  Neck: Normal range of motion. Neck supple. No JVD present. No thyromegaly present.  Cardiovascular: Normal heart sounds and intact distal pulses.  Exam reveals no gallop and no friction rub.   No murmur heard. The left dorsalis pedis pulse, slightly diminished.  The left posterior tibial pulse absent Occasional ectopics  Pulmonary/Chest: Effort normal and breath sounds normal. He exhibits no tenderness.  Abdominal: Soft. Bowel sounds are normal. He exhibits no distension and no mass. There is no tenderness.  Genitourinary: Penis normal.  Genitourinary Comments: Prostate +2 enlarged, left lobe, slightly more prominent  Musculoskeletal: Normal range of motion. He exhibits no edema or tenderness.  Lymphadenopathy:    He has no cervical adenopathy.  Neurological: He is alert. He has normal reflexes. No cranial nerve deficit. Coordination normal.  Skin: Skin is warm and dry. No rash noted.  Psychiatric: He has a normal mood and affect. His behavior is normal. Judgment and  thought content normal.          Assessment & Plan:    Annual health assessment Hypertension stable Mild dyslipidemia Mild exogenous obesity, moderate weight loss encouraged GERD   left ventricular dysfunction-mild  testosterone insufficiency.  In view of his issues with ED will start on transdermal replacement.  Will recheck testosterone level in 3 months and also check PSA at that time.  Impaired glucose tolerance.  We'll continue efforts at weight loss and regular exercise  Low salt low calorie diet and coverage. Regular exercise also encouraged. 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   Nyoka Cowden

## 2016-09-27 NOTE — Patient Instructions (Signed)
Limit your sodium (Salt) intake    It is important that you exercise regularly, at least 20 minutes 3 to 4 times per week.  If you develop chest pain or shortness of breath seek  medical attention.  You need to lose weight.  Consider a lower calorie diet and regular exercise.  Return in 3 months for follow-up  

## 2016-09-27 NOTE — Progress Notes (Signed)
Pre visit review using our clinic review tool, if applicable. No additional management support is needed unless otherwise documented below in the visit note. cpe

## 2016-11-04 DIAGNOSIS — H52223 Regular astigmatism, bilateral: Secondary | ICD-10-CM | POA: Diagnosis not present

## 2016-11-04 DIAGNOSIS — H5203 Hypermetropia, bilateral: Secondary | ICD-10-CM | POA: Diagnosis not present

## 2016-11-04 DIAGNOSIS — H25093 Other age-related incipient cataract, bilateral: Secondary | ICD-10-CM | POA: Diagnosis not present

## 2016-11-16 DIAGNOSIS — H401122 Primary open-angle glaucoma, left eye, moderate stage: Secondary | ICD-10-CM | POA: Diagnosis not present

## 2016-11-16 DIAGNOSIS — H401111 Primary open-angle glaucoma, right eye, mild stage: Secondary | ICD-10-CM | POA: Diagnosis not present

## 2016-11-16 DIAGNOSIS — H5319 Other subjective visual disturbances: Secondary | ICD-10-CM | POA: Diagnosis not present

## 2016-12-08 ENCOUNTER — Ambulatory Visit (INDEPENDENT_AMBULATORY_CARE_PROVIDER_SITE_OTHER): Payer: Medicare Other | Admitting: Internal Medicine

## 2016-12-08 ENCOUNTER — Encounter: Payer: Self-pay | Admitting: Internal Medicine

## 2016-12-08 VITALS — BP 150/80 | HR 70 | Temp 98.2°F | Ht 63.75 in | Wt 189.6 lb

## 2016-12-08 DIAGNOSIS — I10 Essential (primary) hypertension: Secondary | ICD-10-CM | POA: Diagnosis not present

## 2016-12-08 DIAGNOSIS — E349 Endocrine disorder, unspecified: Secondary | ICD-10-CM | POA: Diagnosis not present

## 2016-12-08 DIAGNOSIS — N400 Enlarged prostate without lower urinary tract symptoms: Secondary | ICD-10-CM | POA: Diagnosis not present

## 2016-12-08 DIAGNOSIS — R7302 Impaired glucose tolerance (oral): Secondary | ICD-10-CM | POA: Diagnosis not present

## 2016-12-08 DIAGNOSIS — E785 Hyperlipidemia, unspecified: Secondary | ICD-10-CM | POA: Diagnosis not present

## 2016-12-08 LAB — TESTOSTERONE: Testosterone: 301.11 ng/dL (ref 300.00–890.00)

## 2016-12-08 LAB — PSA: PSA: 0.99 ng/mL (ref 0.10–4.00)

## 2016-12-08 NOTE — Progress Notes (Signed)
Subjective:    [name], male    DOB: 09/20/1941, 76 y.o.   MRN: [id number]  HPI  76 year old patient who is seen today for follow-up.  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  He is seen today for a testosterone level as well as follow-up PSA. He is doing well.  He has essential hypertension. No new concerns or complaints.  His wife of 2 years duration is from [place] and her visa will expire later this summer.  Patient is requesting a letter detailing his medical status to assist with legal issues regarding her immigration status.  Past Medical History:  Diagnosis Date  . ALLERGIC RHINITIS 07/13/2007  . HYPERLIPIDEMIA 07/13/2007  . HYPERTENSION 07/13/2007  . 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 PEPTIC ULCER DISEASE 07/13/2007  . 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 Marital status: Legally Separated    Spouse name: N/A  . Number of children: N/A  . Years of education: N/A   Occupational History  . Not on file.   Social History Main Topics  . Smoking status: Never Smoker  . Smokeless tobacco: Never Used  . Alcohol use Yes  . Drug use: No  . Sexual activity: Not on file   Other Topics Concern  . Not on file   Social History Narrative  . No narrative on file    Past Surgical History:  Procedure Laterality Date  . ROTATOR CUFF REPAIR      No family history on file.  Allergies  Allergen Reactions  . Lisinopril Cough    Cough  . Tramadol Hcl     REACTION: unspecified    Current Outpatient Prescriptions on File Prior to Visit  Medication Sig Dispense Refill  . clonazePAM (KLONOPIN) 0.5 MG tablet TAKE ONE TABLET BY MOUTH TWICE DAILY AS NEEDED FOR ANXIETY 60 tablet 2  . fexofenadine (ALLEGRA) 180 MG tablet Take 1 tablet (180 mg total) by mouth daily. 60 tablet 2  . fluticasone (FLONASE) 50 MCG/ACT nasal spray Place 2 sprays into both nostrils daily. 16 g 6    . latanoprost (XALATAN) 0.005 % ophthalmic solution Place 1 drop into the left eye at bedtime.     Marland Kitchen levocetirizine (XYZAL) 5 MG tablet     . losartan-hydrochlorothiazide (HYZAAR) 100-25 MG tablet Take 1 tablet by mouth daily. 90 tablet 1  . naproxen (NAPROSYN) 500 MG tablet TAKE ONE TABLET BY MOUTH TWICE DAILY WITH MEALS 60 tablet 5  . omeprazole (PRILOSEC) 20 MG capsule TAKE ONE CAPSULE BY MOUTH ONCE DAILY 90 capsule 3  . tadalafil (CIALIS) 5 MG tablet Take 1 tablet (5 mg total) by mouth daily as needed for erectile dysfunction. 30 tablet 2  . Testosterone (ANDROGEL PUMP) 20.25 MG/ACT (1.62%) GEL Place 2 puffs onto the skin every morning. 75 g 5   No current facility-administered medications on file prior to visit.     BP (!) 150/80 (BP Location: Right Arm, Patient Position: Sitting, Cuff Size: Large)   Pulse 70   Temp 98.2 F (36.8 C) (Oral)   Ht 5' 3.75" (1.619 m)   Wt 189 lb 9.6 oz (86 kg)   SpO2 98%   BMI 32.80 kg/m      Review of Systems  Constitutional: Negative for appetite change, chills, fatigue and fever.  HENT: Negative for congestion, dental problem, ear pain, hearing loss, sore throat, tinnitus, trouble swallowing and voice change.  Eyes: Negative for pain, discharge and visual disturbance.  Respiratory: Negative for cough, chest tightness, wheezing and stridor.   Cardiovascular: Negative for chest pain, palpitations and leg swelling.  Gastrointestinal: Negative for abdominal distention, abdominal pain, blood in stool, constipation, diarrhea, nausea and vomiting.  Genitourinary: Negative for difficulty urinating, discharge, flank pain, genital sores, hematuria and urgency.  Musculoskeletal: Negative for arthralgias, back pain, gait problem, joint swelling, myalgias and neck stiffness.  Skin: Negative for rash.  Neurological: Negative for dizziness, syncope, speech difficulty, weakness, numbness and headaches.  Hematological: Negative for adenopathy. Does not  bruise/bleed easily.  Psychiatric/Behavioral: Negative for behavioral problems and dysphoric mood. The patient is not nervous/anxious.        Objective:   Physical Exam  Constitutional: He is oriented to person, place, and time. He appears well-developed.  Blood pressure 134/80 Weight 189  HENT:  Head: Normocephalic.  Right Ear: External ear normal.  Left Ear: External ear normal.  Eyes: Conjunctivae and EOM are normal.  Neck: Normal range of motion.  Cardiovascular: Normal rate and normal heart sounds.   Pulmonary/Chest: Breath sounds normal.  Abdominal: Bowel sounds are normal.  Musculoskeletal: Normal range of motion. He exhibits no edema or tenderness.  Neurological: He is alert and oriented to person, place, and time.  Psychiatric: He has a normal mood and affect. His behavior is normal.          Assessment & Plan:   Essential hypertension.  No change in therapy Obesity.  Weight loss encouraged Testosterone insufficiency.  Will check a testosterone level with PSA  Weight loss exercise encouraged Follow-up 6 months  Pantelis Elgersma Pilar Plate

## 2016-12-08 NOTE — Patient Instructions (Signed)
Limit your sodium (Salt) intake    It is important that you exercise regularly, at least 20 minutes 3 to 4 times per week.  If you develop chest pain or shortness of breath seek  medical attention.  You need to lose weight.  Consider a lower calorie diet and regular exercise.  Return in 6 months for follow-up  Please check your blood pressure on a regular basis.  If it is consistently greater than 150/90, please make an office appointment.

## 2016-12-08 NOTE — Progress Notes (Signed)
Pre visit review using our clinic review tool, if applicable. No additional management support is needed unless otherwise documented below in the visit note. 

## 2016-12-13 ENCOUNTER — Other Ambulatory Visit: Payer: Self-pay

## 2016-12-20 ENCOUNTER — Encounter: Payer: Self-pay | Admitting: Internal Medicine

## 2016-12-22 ENCOUNTER — Telehealth: Payer: Self-pay | Admitting: Internal Medicine

## 2016-12-22 NOTE — Telephone Encounter (Signed)
° ° ° °  Pt would like a call back about his testosterone lab results

## 2016-12-23 NOTE — Telephone Encounter (Signed)
See message below, please advise.

## 2016-12-24 NOTE — Telephone Encounter (Signed)
Spoke with pt informed him that his Testosterone level was in a low-normal range. Pt verbalized understanding.

## 2016-12-24 NOTE — Telephone Encounter (Signed)
Left message on voicemail to call office.  

## 2016-12-24 NOTE — Telephone Encounter (Signed)
Testosterone level was in a low-normal range

## 2017-02-01 DIAGNOSIS — G8929 Other chronic pain: Secondary | ICD-10-CM | POA: Diagnosis not present

## 2017-02-01 DIAGNOSIS — M1712 Unilateral primary osteoarthritis, left knee: Secondary | ICD-10-CM | POA: Diagnosis not present

## 2017-02-01 DIAGNOSIS — M25562 Pain in left knee: Secondary | ICD-10-CM | POA: Diagnosis not present

## 2017-03-22 ENCOUNTER — Ambulatory Visit (INDEPENDENT_AMBULATORY_CARE_PROVIDER_SITE_OTHER): Payer: Medicare Other | Admitting: Internal Medicine

## 2017-03-22 ENCOUNTER — Encounter: Payer: Self-pay | Admitting: Internal Medicine

## 2017-03-22 ENCOUNTER — Other Ambulatory Visit: Payer: Self-pay | Admitting: Internal Medicine

## 2017-03-22 VITALS — BP 142/78 | HR 66 | Temp 98.1°F | Ht 63.75 in | Wt 183.0 lb

## 2017-03-22 DIAGNOSIS — N529 Male erectile dysfunction, unspecified: Secondary | ICD-10-CM | POA: Insufficient documentation

## 2017-03-22 DIAGNOSIS — I10 Essential (primary) hypertension: Secondary | ICD-10-CM

## 2017-03-22 DIAGNOSIS — E349 Endocrine disorder, unspecified: Secondary | ICD-10-CM | POA: Diagnosis not present

## 2017-03-22 DIAGNOSIS — N400 Enlarged prostate without lower urinary tract symptoms: Secondary | ICD-10-CM | POA: Diagnosis not present

## 2017-03-22 DIAGNOSIS — N528 Other male erectile dysfunction: Secondary | ICD-10-CM | POA: Diagnosis not present

## 2017-03-22 MED ORDER — TESTOSTERONE 20.25 MG/ACT (1.62%) TD GEL: 3.0000 | Freq: Every morning | TRANSDERMAL | 5 refills | Status: DC

## 2017-03-22 MED ORDER — TAMSULOSIN HCL 0.4 MG PO CAPS
0.4000 mg | ORAL_CAPSULE | Freq: Every day | ORAL | 3 refills | Status: DC
Start: 2017-03-22 — End: 2017-12-30

## 2017-03-22 MED ORDER — LOSARTAN POTASSIUM-HCTZ 100-25 MG PO TABS: 1.0000 | ORAL_TABLET | Freq: Every day | ORAL | 1 refills | Status: DC

## 2017-03-22 MED ORDER — LOSARTAN POTASSIUM 100 MG PO TABS: 100.0000 mg | ORAL_TABLET | Freq: Every day | ORAL | 3 refills | Status: DC

## 2017-03-22 MED ORDER — TAMSULOSIN HCL 0.4 MG PO CAPS: 0.4000 mg | ORAL_CAPSULE | Freq: Every day | ORAL | 3 refills | Status: DC

## 2017-03-22 MED ORDER — OMEPRAZOLE 20 MG PO CPDR: 20.0000 mg | DELAYED_RELEASE_CAPSULE | Freq: Every day | ORAL | 3 refills | Status: DC

## 2017-03-22 MED ORDER — CLONAZEPAM 0.5 MG PO TABS: 0.5000 mg | ORAL_TABLET | Freq: Two times a day (BID) | ORAL | 2 refills | Status: DC | PRN

## 2017-03-22 NOTE — Progress Notes (Signed)
Subjective:    [name], male    DOB: 1941-02-01, 76 y.o.   MRN: [id number]  HPI  76 year old patient who is seen today in follow-up. His main complaint is ED that does not respond to Cialis.  He does have a history of testosterone deficiency and is on replacement medication He has a history of BPH and has had worsening symptoms.  He complains of slow stream and nocturia times 4.  This interferes with sleep.  He is requesting to see a urologist  Past Medical History:  Diagnosis Date  . ALLERGIC RHINITIS 07/13/2007  . HYPERLIPIDEMIA 07/13/2007  . HYPERTENSION 07/13/2007  . 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 PEPTIC ULCER DISEASE 07/13/2007  . 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 Marital status: Legally Separated    Spouse name: N/A  . Number of children: N/A  . Years of education: N/A   Occupational History  . Not on file.   Social History Main Topics  . Smoking status: Never Smoker  . Smokeless tobacco: Never Used  . Alcohol use Yes  . Drug use: No  . Sexual activity: Not on file   Other Topics Concern  . Not on file   Social History Narrative  . No narrative on file    Past Surgical History:  Procedure Laterality Date  . ROTATOR CUFF REPAIR      No family history on file.  Allergies  Allergen Reactions  . Lisinopril Cough    Cough  . Tramadol Hcl     REACTION: unspecified    Current Outpatient Prescriptions on File Prior to Visit  Medication Sig Dispense Refill  . clonazePAM (KLONOPIN) 0.5 MG tablet TAKE ONE TABLET BY MOUTH TWICE DAILY AS NEEDED FOR ANXIETY 60 tablet 2  . fexofenadine (ALLEGRA) 180 MG tablet Take 1 tablet (180 mg total) by mouth daily. 60 tablet 2  . fluticasone (FLONASE) 50 MCG/ACT nasal spray Place 2 sprays into both nostrils daily. 16 g 6  . latanoprost (XALATAN) 0.005 % ophthalmic solution Place 1 drop into the left eye at bedtime.     Marland Kitchen levocetirizine (XYZAL) 5 MG tablet      . naproxen (NAPROSYN) 500 MG tablet TAKE ONE TABLET BY MOUTH TWICE DAILY WITH MEALS 60 tablet 5  . omeprazole (PRILOSEC) 20 MG capsule TAKE ONE CAPSULE BY MOUTH ONCE DAILY 90 capsule 3  . tadalafil (CIALIS) 5 MG tablet Take 1 tablet (5 mg total) by mouth daily as needed for erectile dysfunction. 30 tablet 2  . Testosterone (ANDROGEL PUMP) 20.25 MG/ACT (1.62%) GEL Place 2 puffs onto the skin every morning. 75 g 5   No current facility-administered medications on file prior to visit.     BP (!) 142/78 (BP Location: Left Arm, Patient Position: Sitting, Cuff Size: Normal)   Pulse 66   Temp 98.1 F (36.7 C) (Oral)   Ht 5' 3.75" (1.619 m)   Wt 183 lb (83 kg)   SpO2 97%   BMI 31.66 kg/m     Review of Systems  Constitutional: Negative for appetite change, chills, fatigue and fever.  HENT: Negative for congestion, dental problem, ear pain, hearing loss, sore throat, tinnitus, trouble swallowing and voice change.   Eyes: Negative for pain, discharge and visual disturbance.  Respiratory: Negative for cough, chest tightness, wheezing and stridor.   Cardiovascular: Negative for chest pain, palpitations and leg swelling.  Gastrointestinal: Negative for abdominal distention, abdominal pain, blood  in stool, constipation, diarrhea, nausea and vomiting.  Genitourinary: Positive for decreased urine volume, difficulty urinating and frequency. Negative for discharge, flank pain, genital sores, hematuria and urgency.  Musculoskeletal: Negative for arthralgias, back pain, gait problem, joint swelling, myalgias and neck stiffness.  Skin: Negative for rash.  Neurological: Negative for dizziness, syncope, speech difficulty, weakness, numbness and headaches.  Hematological: Negative for adenopathy. Does not bruise/bleed easily.  Psychiatric/Behavioral: Negative for behavioral problems and dysphoric mood. The patient is not nervous/anxious.        Objective:   Physical Exam  Constitutional: He is  oriented to person, place, and time. He appears well-developed.  HENT:  Head: Normocephalic.  Right Ear: External ear normal.  Left Ear: External ear normal.  Eyes: Conjunctivae and EOM are normal.  Neck: Normal range of motion.  Cardiovascular: Normal rate and normal heart sounds.   Pulmonary/Chest: Breath sounds normal.  Abdominal: Bowel sounds are normal.  Musculoskeletal: Normal range of motion. He exhibits no edema or tenderness.  Neurological: He is alert and oriented to person, place, and time.  Psychiatric: He has a normal mood and affect. His behavior is normal.          Assessment & Plan:   Essential hypertension.  In view of nocturia.  Will discontinue hydrochlorothiazide BPH.  We'll place on Flomax 0.4 milligram discontinue hydrochlorothiazide ED Testosterone deficiency.  We'll increase AndroGel 2 3 puffs daily  Urology referral as requested  Follow-up here 6 months  Aland Chestnutt Pilar Plate

## 2017-03-22 NOTE — Patient Instructions (Addendum)
WE NOW OFFER   [facility] FAST TRACK!!!  SAME DAY Appointments for ACUTE CARE  Such as: Sprains, Injuries, cuts, abrasions, rashes, muscle pain, joint pain, back pain Colds, flu, sore throats, headache, allergies, cough, fever  Ear pain, sinus and eye infections Abdominal pain, nausea, vomiting, diarrhea, upset stomach Animal/insect bites  3 Easy Ways to Schedule: Walk-In Scheduling Call in scheduling Mychart Sign-up: [email or web address]    Limit your sodium (Salt) intake  Urology consultation as discussed  Please check your blood pressure on a regular basis.  If it is consistently greater than 150/90, please make an office appointment.    It is important that you exercise regularly, at least 20 minutes 3 to 4 times per week.  If you develop chest pain or shortness of breath seek  medical attention.

## 2017-03-22 NOTE — Progress Notes (Signed)
Pre visit review using our clinic review tool, if applicable. No additional management support is needed unless otherwise documented below in the visit note. 

## 2017-03-28 ENCOUNTER — Other Ambulatory Visit: Payer: Self-pay | Admitting: Internal Medicine

## 2017-03-28 MED ORDER — NAPROXEN 500 MG PO TABS: 500.0000 mg | ORAL_TABLET | Freq: Two times a day (BID) | ORAL | 2 refills | Status: DC

## 2017-03-28 MED ORDER — LEVOCETIRIZINE DIHYDROCHLORIDE 5 MG PO TABS: 5.0000 mg | ORAL_TABLET | Freq: Every evening | ORAL | 2 refills | Status: DC

## 2017-04-20 DIAGNOSIS — H401111 Primary open-angle glaucoma, right eye, mild stage: Secondary | ICD-10-CM | POA: Diagnosis not present

## 2017-04-20 DIAGNOSIS — H5319 Other subjective visual disturbances: Secondary | ICD-10-CM | POA: Diagnosis not present

## 2017-04-20 DIAGNOSIS — H401122 Primary open-angle glaucoma, left eye, moderate stage: Secondary | ICD-10-CM | POA: Diagnosis not present

## 2017-04-20 DIAGNOSIS — H35033 Hypertensive retinopathy, bilateral: Secondary | ICD-10-CM | POA: Diagnosis not present

## 2017-04-21 ENCOUNTER — Other Ambulatory Visit: Payer: Self-pay | Admitting: Internal Medicine

## 2017-04-21 MED ORDER — FLUTICASONE PROPIONATE 50 MCG/ACT NA SUSP: 2.0000 | Freq: Every day | NASAL | 6 refills | Status: DC

## 2017-05-16 ENCOUNTER — Other Ambulatory Visit: Payer: Self-pay | Admitting: Internal Medicine

## 2017-06-15 ENCOUNTER — Telehealth: Payer: Self-pay

## 2017-06-15 NOTE — Telephone Encounter (Signed)
[facility] sent refill request for Xalatan eye drops. Per chart pt has not had this filled since 01/09/16 with no refills.  Please advise on refill. Thanks!

## 2017-06-17 ENCOUNTER — Other Ambulatory Visit: Payer: Self-pay | Admitting: Internal Medicine

## 2017-06-17 NOTE — Telephone Encounter (Signed)
Okay for refill.  Please ask  patient to follow-up with his ophthalmologist

## 2017-06-17 NOTE — Telephone Encounter (Signed)
LMTCB

## 2017-06-20 MED ORDER — LATANOPROST 0.005 % OP SOLN: 1.0000 [drp] | Freq: Every day | OPHTHALMIC | 1 refills | Status: AC

## 2017-06-20 NOTE — Telephone Encounter (Signed)
Spoke with pt and advised. He states that he has appt with opthalmology in November, and advised him to keep this appt. He agrees. Rx sent. Nothing further needed at this time.

## 2017-10-10 ENCOUNTER — Encounter: Payer: Self-pay | Admitting: Internal Medicine

## 2017-10-10 ENCOUNTER — Ambulatory Visit (INDEPENDENT_AMBULATORY_CARE_PROVIDER_SITE_OTHER): Payer: Medicare Other | Admitting: Internal Medicine

## 2017-10-10 VITALS — BP 148/72 | HR 74 | Temp 97.5°F | Ht 63.75 in | Wt 174.8 lb

## 2017-10-10 DIAGNOSIS — Z0000 Encounter for general adult medical examination without abnormal findings: Secondary | ICD-10-CM

## 2017-10-10 DIAGNOSIS — E349 Endocrine disorder, unspecified: Secondary | ICD-10-CM | POA: Diagnosis not present

## 2017-10-10 DIAGNOSIS — N400 Enlarged prostate without lower urinary tract symptoms: Secondary | ICD-10-CM

## 2017-10-10 DIAGNOSIS — R7302 Impaired glucose tolerance (oral): Secondary | ICD-10-CM

## 2017-10-10 DIAGNOSIS — Z23 Encounter for immunization: Secondary | ICD-10-CM

## 2017-10-10 DIAGNOSIS — H401122 Primary open-angle glaucoma, left eye, moderate stage: Secondary | ICD-10-CM | POA: Diagnosis not present

## 2017-10-10 DIAGNOSIS — H401111 Primary open-angle glaucoma, right eye, mild stage: Secondary | ICD-10-CM | POA: Diagnosis not present

## 2017-10-10 DIAGNOSIS — E785 Hyperlipidemia, unspecified: Secondary | ICD-10-CM

## 2017-10-10 DIAGNOSIS — I10 Essential (primary) hypertension: Secondary | ICD-10-CM | POA: Diagnosis not present

## 2017-10-10 DIAGNOSIS — I519 Heart disease, unspecified: Secondary | ICD-10-CM

## 2017-10-10 DIAGNOSIS — H04212 Epiphora due to excess lacrimation, left lacrimal gland: Secondary | ICD-10-CM | POA: Diagnosis not present

## 2017-10-10 LAB — CBC WITH DIFFERENTIAL/PLATELET
BASOS PCT: 0.5 % (ref 0.0–3.0)
Basophils Absolute: 0 10*3/uL (ref 0.0–0.1)
EOS PCT: 7.5 % — AB (ref 0.0–5.0)
Eosinophils Absolute: 0.7 10*3/uL (ref 0.0–0.7)
HEMATOCRIT: 44.1 % (ref 39.0–52.0)
Hemoglobin: 15.1 g/dL (ref 13.0–17.0)
LYMPHS PCT: 15.5 % (ref 12.0–46.0)
Lymphs Abs: 1.5 10*3/uL (ref 0.7–4.0)
MCHC: 34.2 g/dL (ref 30.0–36.0)
MCV: 92.9 fl (ref 78.0–100.0)
MONO ABS: 0.5 10*3/uL (ref 0.1–1.0)
Monocytes Relative: 5.2 % (ref 3.0–12.0)
NEUTROS ABS: 6.7 10*3/uL (ref 1.4–7.7)
Neutrophils Relative %: 71.3 % (ref 43.0–77.0)
PLATELETS: 267 10*3/uL (ref 150.0–400.0)
RBC: 4.74 Mil/uL (ref 4.22–5.81)
RDW: 14 % (ref 11.5–15.5)
WBC: 9.5 10*3/uL (ref 4.0–10.5)

## 2017-10-10 LAB — LIPID PANEL
CHOL/HDL RATIO: 5
Cholesterol: 193 mg/dL (ref 0–200)
HDL: 41.4 mg/dL (ref >39.00)
LDL CALC: 116 mg/dL — AB (ref 0–99)
NonHDL: 151.38
Triglycerides: 175 mg/dL — ABNORMAL HIGH (ref 0.0–149.0)
VLDL: 35 mg/dL (ref 0.0–40.0)

## 2017-10-10 LAB — COMPREHENSIVE METABOLIC PANEL
ALK PHOS: 58 U/L (ref 39–117)
ALT: 19 U/L (ref 0–53)
AST: 17 U/L (ref 0–37)
Albumin: 4.4 g/dL (ref 3.5–5.2)
BILIRUBIN TOTAL: 0.7 mg/dL (ref 0.2–1.2)
BUN: 19 mg/dL (ref 6–23)
CO2: 29 meq/L (ref 19–32)
Calcium: 9.5 mg/dL (ref 8.4–10.5)
Chloride: 102 mEq/L (ref 96–112)
Creatinine, Ser: 0.9 mg/dL (ref 0.40–1.50)
GFR: 87.2 mL/min (ref >60.00)
GLUCOSE: 105 mg/dL — AB (ref 70–99)
Potassium: 4 mEq/L (ref 3.5–5.1)
SODIUM: 140 meq/L (ref 135–145)
TOTAL PROTEIN: 6.9 g/dL (ref 6.0–8.3)

## 2017-10-10 LAB — TSH: TSH: 2.46 u[IU]/mL (ref 0.35–4.50)

## 2017-10-10 LAB — PSA: PSA: 1.17 ng/mL (ref 0.10–4.00)

## 2017-10-10 LAB — TESTOSTERONE: Testosterone: 301.48 ng/dL (ref 300.00–890.00)

## 2017-10-10 NOTE — Patient Instructions (Addendum)
Limit your sodium (Salt) intake  Please check your blood pressure on a regular basis.  If it is consistently greater than 150/90, please make an office appointment.    It is important that you exercise regularly, at least 20 minutes 3 to 4 times per week.  If you develop chest pain or shortness of breath seek  medical attention.  You need to lose weight.  Consider a lower calorie diet and regular exercise.  Return in 6 months for follow-up   Please check your blood pressure on a regular basis.  If it is consistently greater than 150/90, please make an office appointment.  Schedule your colonoscopy to help detect colon cancer.

## 2017-10-10 NOTE — Addendum Note (Signed)
Addended by: Abelardo Diesel on: 10/10/2017 11:08 AM   Modules accepted: Orders

## 2017-10-10 NOTE — Progress Notes (Signed)
Subjective:    [name], male    DOB: 1941-03-09, 75 y.o.   MRN: [id number]  HPI 76 year old patient who is seen today for a annual comprehensive exam as well as a subsequent Medicare wellness visit. He has a history of essential hypertension and dyslipidemia.  He has a history of colonic polyps and is scheduled for follow-up colonoscopy in the near future.  He has a history of testosterone deficiency and has been on a transdermal supplementation in the past.  He is followed by orthopedics due to bilateral knee pain left greater than the right  Family history fairly noncontributory.  Father died at 73.  Mother died during childbirth.  One brother died of lung cancer.  One sister died of advanced senile dementia  Past Medical History:  Diagnosis Date  . ALLERGIC RHINITIS 07/13/2007  . HYPERLIPIDEMIA 07/13/2007  . HYPERTENSION 07/13/2007  . 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 PEPTIC ULCER DISEASE 07/13/2007  . 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 Marital status: Legally Separated    Spouse name: Not on file  . Number of children: Not on file  . Years of education: Not on file  . Highest education level: Not on file  Social Needs  . Financial resource strain: Not on file  . Food insecurity - worry: Not on file  . Food insecurity - inability: Not on file  . Transportation needs - medical: Not on file  . Transportation needs - non-medical: Not on file  Occupational History  . Not on file  Tobacco Use  . Smoking status: Never Smoker  . Smokeless tobacco: Never Used  Substance and Sexual Activity  . Alcohol use: Yes  . Drug use: No  . Sexual activity: Not on file  Other Topics Concern  . Not on file  Social History Narrative  . Not on file    Past Surgical History:  Procedure Laterality Date  . ROTATOR CUFF REPAIR      History reviewed. No pertinent family history.  Allergies  Allergen Reactions  .  Lisinopril Cough    Cough  . Tramadol Hcl     REACTION: unspecified    Current Outpatient Medications on File Prior to Visit  Medication Sig Dispense Refill  . clonazePAM (KLONOPIN) 0.5 MG tablet Take 1 tablet (0.5 mg total) by mouth 2 (two) times daily as needed. for anxiety 60 tablet 2  . fexofenadine (ALLEGRA) 180 MG tablet Take 1 tablet (180 mg total) by mouth daily. 60 tablet 2  . fluticasone (FLONASE) 50 MCG/ACT nasal spray Place 2 sprays into both nostrils daily. 16 g 6  . latanoprost (XALATAN) 0.005 % ophthalmic solution Place 1 drop into both eyes at bedtime. 7.5 mL 1  . levocetirizine (XYZAL) 5 MG tablet Take 1 tablet (5 mg total) by mouth every evening. 90 tablet 2  . losartan (COZAAR) 100 MG tablet Take 1 tablet (100 mg total) by mouth daily. 90 tablet 3  . naproxen (NAPROSYN) 500 MG tablet TAKE 1 TABLET BY MOUTH TWO  TIMES DAILY WITH MEAL(S) 180 tablet 1  . omeprazole (PRILOSEC) 20 MG capsule Take 1 capsule (20 mg total) by mouth daily. 90 capsule 3  . tadalafil (CIALIS) 5 MG tablet Take 1 tablet (5 mg total) by mouth daily as needed for erectile dysfunction. 30 tablet 2  . tamsulosin (FLOMAX) 0.4 MG CAPS capsule Take 1 capsule (0.4 mg total) by mouth daily. 30 capsule 3  .  Testosterone (ANDROGEL PUMP) 20.25 MG/ACT (1.62%) GEL Place 3 puffs onto the skin every morning. 75 g 5   No current facility-administered medications on file prior to visit.     BP (!) 148/72 (BP Location: Left Arm, Patient Position: Sitting, Cuff Size: Normal)   Pulse 74   Temp (!) 97.5 F (36.4 C) (Oral)   Ht 5' 3.75" (1.619 m)   Wt 174 lb 12.8 oz (79.3 kg)   BMI 30.24 kg/m   Subsequent Medicare wellness visit  1. Risk factors, based on past  M,S,F history.  Cardiovascular risk factors include hypertension and dyslipidemia.  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  2.  Physical activities: Limited somewhat bilateral knee pain.  No significant exercise restrictions  3.  Depression/mood: No  history of major depression  4.  Hearing: Use a hearing aid right ear  5.  ADL's: Independent  6.  Fall risk: Low.  No falls within the past year  7.  Home safety: No problems identified  8.  Height weight, and visual acuity; height and weight stable no change in visual acuity is followed by ophthalmology for glaucoma  9.  Counseling: Heart healthy diet recommended.  Follow-up colonoscopy encouraged  10. Lab orders based on risk factors: Laboratory update will be reviewed.  This will include a PSA since he is on testosterone replacement therapy  11. Referral : Colonoscopy referral  12. Care plan: Continue efforts at aggressive risk factor modification  13. Cognitive assessment: Alert and oriented with normal affect.  No cognitive dysfunction  14. Screening: Patient provided with a written and personalized 5-10 year screening schedule in the AVS.    15. Provider List Update: Primary care ophthalmology and GI As well as orthopedics    Review of Systems  Constitutional: Negative for appetite change, chills, fatigue and fever.  HENT: Negative for congestion, dental problem, ear pain, hearing loss, sore throat, tinnitus, trouble swallowing and voice change.   Eyes: Negative for pain, discharge and visual disturbance.  Respiratory: Negative for cough, chest tightness, wheezing and stridor.   Cardiovascular: Negative for chest pain, palpitations and leg swelling.  Gastrointestinal: Negative for abdominal distention, abdominal pain, blood in stool, constipation, diarrhea, nausea and vomiting.  Genitourinary: Negative for difficulty urinating, discharge, flank pain, genital sores, hematuria and urgency.  Musculoskeletal: Negative for arthralgias, back pain, gait problem, joint swelling, myalgias and neck stiffness.       Bilateral knee pain  Skin: Negative for rash.  Neurological: Negative for dizziness, syncope, speech difficulty, weakness, numbness and headaches.  Hematological:  Negative for adenopathy. Does not bruise/bleed easily.  Psychiatric/Behavioral: Negative for behavioral problems and dysphoric mood. The patient is not nervous/anxious.        Objective:   Physical Exam  Constitutional: He appears well-developed and well-nourished.  Weight 174 Blood pressure 130/76  HENT:  Head: Normocephalic and atraumatic.  Right Ear: External ear normal.  Left Ear: External ear normal.  Nose: Nose normal.  Mouth/Throat: Oropharynx is clear and moist.  Eyes: Conjunctivae and EOM are normal. Pupils are equal, round, and reactive to light. No scleral icterus.  Neck: Normal range of motion. Neck supple. No JVD present. No thyromegaly present.  Cardiovascular: Regular rhythm, normal heart sounds and intact distal pulses. Exam reveals no gallop and no friction rub.  No murmur heard. Pulmonary/Chest: Effort normal and breath sounds normal. He exhibits no tenderness.  Abdominal: Soft. Bowel sounds are normal. He exhibits no distension and no mass. There is no tenderness.  Genitourinary: Penis normal. Rectal exam shows guaiac negative stool.  Genitourinary Comments: Prostate enlargement  Musculoskeletal: Normal range of motion. He exhibits no edema or tenderness.  Lymphadenopathy:    He has no cervical adenopathy.  Neurological: He is alert. He has normal reflexes. No cranial nerve deficit. Coordination normal.  Skin: Skin is warm and dry. No rash noted.  Psychiatric: He has a normal mood and affect. His behavior is normal.          Assessment & Plan:  Preventive health examination.  Flu vaccine administered  Subsequent Medicare wellness visit.  Set up for follow-up colonoscopy  Essential hypertension stable Osteoarthritis knees.  Orthopedic follow-up Glaucoma.  Ophthalmology follow-up Testosterone deficiency.  We will check a PSA Mild BPH History of impaired glucose tolerance.  Will review a fasting blood sugar Dyslipidemia lipid profile  reviewed  Nyoka Cowden

## 2017-11-03 DIAGNOSIS — M1711 Unilateral primary osteoarthritis, right knee: Secondary | ICD-10-CM | POA: Diagnosis not present

## 2017-11-03 DIAGNOSIS — M1712 Unilateral primary osteoarthritis, left knee: Secondary | ICD-10-CM | POA: Diagnosis not present

## 2017-11-03 DIAGNOSIS — M170 Bilateral primary osteoarthritis of knee: Secondary | ICD-10-CM | POA: Diagnosis not present

## 2017-11-15 DIAGNOSIS — H401122 Primary open-angle glaucoma, left eye, moderate stage: Secondary | ICD-10-CM | POA: Diagnosis not present

## 2017-11-15 DIAGNOSIS — H401111 Primary open-angle glaucoma, right eye, mild stage: Secondary | ICD-10-CM | POA: Diagnosis not present

## 2017-12-13 DIAGNOSIS — M1712 Unilateral primary osteoarthritis, left knee: Secondary | ICD-10-CM | POA: Insufficient documentation

## 2017-12-13 DIAGNOSIS — M179 Osteoarthritis of knee, unspecified: Secondary | ICD-10-CM | POA: Insufficient documentation

## 2017-12-13 DIAGNOSIS — M1711 Unilateral primary osteoarthritis, right knee: Secondary | ICD-10-CM | POA: Insufficient documentation

## 2017-12-19 ENCOUNTER — Other Ambulatory Visit: Payer: Self-pay | Admitting: Internal Medicine

## 2017-12-30 ENCOUNTER — Other Ambulatory Visit: Payer: Self-pay | Admitting: Internal Medicine

## 2018-01-19 ENCOUNTER — Other Ambulatory Visit: Payer: Self-pay | Admitting: *Deleted

## 2018-01-19 DIAGNOSIS — M25561 Pain in right knee: Secondary | ICD-10-CM | POA: Diagnosis not present

## 2018-01-19 MED ORDER — TESTOSTERONE 20.25 MG/ACT (1.62%) TD GEL: 3.0000 | Freq: Every morning | TRANSDERMAL | 5 refills | Status: DC

## 2018-01-30 ENCOUNTER — Other Ambulatory Visit: Payer: Self-pay | Admitting: Internal Medicine

## 2018-02-20 ENCOUNTER — Other Ambulatory Visit: Payer: Self-pay | Admitting: Internal Medicine

## 2018-03-23 ENCOUNTER — Other Ambulatory Visit: Payer: Self-pay | Admitting: Internal Medicine

## 2018-04-11 DIAGNOSIS — S83241A Other tear of medial meniscus, current injury, right knee, initial encounter: Secondary | ICD-10-CM | POA: Diagnosis not present

## 2018-04-11 DIAGNOSIS — M23261 Derangement of other lateral meniscus due to old tear or injury, right knee: Secondary | ICD-10-CM | POA: Diagnosis not present

## 2018-04-11 DIAGNOSIS — M948X6 Other specified disorders of cartilage, lower leg: Secondary | ICD-10-CM | POA: Diagnosis not present

## 2018-04-11 DIAGNOSIS — G8918 Other acute postprocedural pain: Secondary | ICD-10-CM | POA: Diagnosis not present

## 2018-04-11 DIAGNOSIS — M23221 Derangement of posterior horn of medial meniscus due to old tear or injury, right knee: Secondary | ICD-10-CM | POA: Diagnosis not present

## 2018-04-21 DIAGNOSIS — H401111 Primary open-angle glaucoma, right eye, mild stage: Secondary | ICD-10-CM | POA: Diagnosis not present

## 2018-04-21 DIAGNOSIS — H25013 Cortical age-related cataract, bilateral: Secondary | ICD-10-CM | POA: Diagnosis not present

## 2018-04-21 DIAGNOSIS — H2513 Age-related nuclear cataract, bilateral: Secondary | ICD-10-CM | POA: Diagnosis not present

## 2018-04-21 DIAGNOSIS — H401122 Primary open-angle glaucoma, left eye, moderate stage: Secondary | ICD-10-CM | POA: Diagnosis not present

## 2018-05-24 ENCOUNTER — Encounter (HOSPITAL_BASED_OUTPATIENT_CLINIC_OR_DEPARTMENT_OTHER): Payer: Self-pay | Admitting: Emergency Medicine

## 2018-05-24 ENCOUNTER — Other Ambulatory Visit: Payer: Self-pay

## 2018-05-24 ENCOUNTER — Inpatient Hospital Stay (HOSPITAL_BASED_OUTPATIENT_CLINIC_OR_DEPARTMENT_OTHER)
Admission: EM | Admit: 2018-05-24 | Discharge: 2018-05-28 | DRG: 392 | Disposition: A | Discharge status: Home / Self Care | Payer: Medicare Other | Attending: Internal Medicine | Admitting: Internal Medicine

## 2018-05-24 ENCOUNTER — Ambulatory Visit: Payer: Self-pay

## 2018-05-24 ENCOUNTER — Emergency Department (HOSPITAL_BASED_OUTPATIENT_CLINIC_OR_DEPARTMENT_OTHER): Payer: Medicare Other

## 2018-05-24 DIAGNOSIS — K5792 Diverticulitis of intestine, part unspecified, without perforation or abscess without bleeding: Principal | ICD-10-CM | POA: Diagnosis present

## 2018-05-24 DIAGNOSIS — D62 Acute posthemorrhagic anemia: Secondary | ICD-10-CM | POA: Diagnosis not present

## 2018-05-24 DIAGNOSIS — K625 Hemorrhage of anus and rectum: Secondary | ICD-10-CM | POA: Diagnosis not present

## 2018-05-24 DIAGNOSIS — K219 Gastro-esophageal reflux disease without esophagitis: Secondary | ICD-10-CM | POA: Diagnosis not present

## 2018-05-24 DIAGNOSIS — I10 Essential (primary) hypertension: Secondary | ICD-10-CM | POA: Diagnosis present

## 2018-05-24 DIAGNOSIS — N400 Enlarged prostate without lower urinary tract symptoms: Secondary | ICD-10-CM | POA: Diagnosis not present

## 2018-05-24 DIAGNOSIS — Z8711 Personal history of peptic ulcer disease: Secondary | ICD-10-CM

## 2018-05-24 DIAGNOSIS — E785 Hyperlipidemia, unspecified: Secondary | ICD-10-CM | POA: Diagnosis not present

## 2018-05-24 DIAGNOSIS — Z888 Allergy status to other drugs, medicaments and biological substances status: Secondary | ICD-10-CM | POA: Diagnosis not present

## 2018-05-24 DIAGNOSIS — I493 Ventricular premature depolarization: Secondary | ICD-10-CM | POA: Diagnosis present

## 2018-05-24 DIAGNOSIS — K922 Gastrointestinal hemorrhage, unspecified: Secondary | ICD-10-CM | POA: Diagnosis not present

## 2018-05-24 DIAGNOSIS — K5730 Diverticulosis of large intestine without perforation or abscess without bleeding: Secondary | ICD-10-CM | POA: Diagnosis not present

## 2018-05-24 DIAGNOSIS — Z79899 Other long term (current) drug therapy: Secondary | ICD-10-CM | POA: Diagnosis not present

## 2018-05-24 DIAGNOSIS — K5793 Diverticulitis of intestine, part unspecified, without perforation or abscess with bleeding: Secondary | ICD-10-CM | POA: Diagnosis not present

## 2018-05-24 DIAGNOSIS — K5732 Diverticulitis of large intestine without perforation or abscess without bleeding: Secondary | ICD-10-CM | POA: Diagnosis not present

## 2018-05-24 LAB — CBC WITH DIFFERENTIAL/PLATELET
BASOS ABS: 0 10*3/uL (ref 0.0–0.1)
BASOS PCT: 0 %
EOS PCT: 5 %
Eosinophils Absolute: 0.6 10*3/uL (ref 0.0–0.7)
HCT: 27.6 % — ABNORMAL LOW (ref 39.0–52.0)
Hemoglobin: 9.7 g/dL — ABNORMAL LOW (ref 13.0–17.0)
Lymphocytes Relative: 14 %
Lymphs Abs: 1.7 10*3/uL (ref 0.7–4.0)
MCH: 31.7 pg (ref 26.0–34.0)
MCHC: 35.1 g/dL (ref 30.0–36.0)
MCV: 90.2 fL (ref 78.0–100.0)
MONO ABS: 0.7 10*3/uL (ref 0.1–1.0)
Monocytes Relative: 6 %
Neutro Abs: 9 10*3/uL — ABNORMAL HIGH (ref 1.7–7.7)
Neutrophils Relative %: 74 %
PLATELETS: 260 10*3/uL (ref 150–400)
RBC: 3.06 MIL/uL — ABNORMAL LOW (ref 4.22–5.81)
RDW: 12.9 % (ref 11.5–15.5)
WBC: 12.1 10*3/uL — ABNORMAL HIGH (ref 4.0–10.5)

## 2018-05-24 LAB — COMPREHENSIVE METABOLIC PANEL
ALBUMIN: 3.7 g/dL (ref 3.5–5.0)
ALK PHOS: 49 U/L (ref 38–126)
ALT: 12 U/L (ref 0–44)
AST: 17 U/L (ref 15–41)
Anion gap: 8 (ref 5–15)
BUN: 29 mg/dL — ABNORMAL HIGH (ref 8–23)
CALCIUM: 8.2 mg/dL — AB (ref 8.9–10.3)
CO2: 26 mmol/L (ref 22–32)
CREATININE: 0.84 mg/dL (ref 0.61–1.24)
Chloride: 101 mmol/L (ref 98–111)
GFR calc Af Amer: >60 mL/min (ref >60)
GFR calc non Af Amer: >60 mL/min (ref >60)
GLUCOSE: 111 mg/dL — AB (ref 70–99)
Potassium: 3.7 mmol/L (ref 3.5–5.1)
SODIUM: 135 mmol/L (ref 135–145)
Total Bilirubin: 0.3 mg/dL (ref 0.3–1.2)
Total Protein: 6.3 g/dL — ABNORMAL LOW (ref 6.5–8.1)

## 2018-05-24 LAB — CBC
HCT: 23.9 % — ABNORMAL LOW (ref 39.0–52.0)
HCT: 20.8 % — ABNORMAL LOW (ref 39.0–52.0)
HEMOGLOBIN: 7.3 g/dL — AB (ref 13.0–17.0)
Hemoglobin: 8.5 g/dL — ABNORMAL LOW (ref 13.0–17.0)
MCH: 31.7 pg (ref 26.0–34.0)
MCH: 31.6 pg (ref 26.0–34.0)
MCHC: 35.6 g/dL (ref 30.0–36.0)
MCHC: 35.1 g/dL (ref 30.0–36.0)
MCV: 89.2 fL (ref 78.0–100.0)
MCV: 90 fL (ref 78.0–100.0)
PLATELETS: 221 10*3/uL (ref 150–400)
PLATELETS: 226 10*3/uL (ref 150–400)
RBC: 2.68 MIL/uL — ABNORMAL LOW (ref 4.22–5.81)
RBC: 2.31 MIL/uL — AB (ref 4.22–5.81)
RDW: 13.2 % (ref 11.5–15.5)
RDW: 13 % (ref 11.5–15.5)
WBC: 10.5 10*3/uL (ref 4.0–10.5)
WBC: 10.5 10*3/uL (ref 4.0–10.5)

## 2018-05-24 LAB — ABO/RH: ABO/RH(D): A POS

## 2018-05-24 LAB — LIPASE, BLOOD: LIPASE: 29 U/L (ref 11–51)

## 2018-05-24 MED ORDER — HYDROCODONE-ACETAMINOPHEN 5-325 MG PO TABS
1.0000 | ORAL_TABLET | ORAL | Status: DC | PRN
Start: 2018-05-24 — End: 2018-05-28

## 2018-05-24 MED ORDER — METRONIDAZOLE IN NACL 5-0.79 MG/ML-% IV SOLN
500.0000 mg | Freq: Three times a day (TID) | INTRAVENOUS | Status: DC
  Administered 2018-05-25 – 2018-05-28 (×11): 500 mg via INTRAVENOUS
  Filled 2018-05-24 (×11): qty 100

## 2018-05-24 MED ORDER — LATANOPROST 0.005 % OP SOLN
1.0000 [drp] | Freq: Every day | OPHTHALMIC | Status: DC
  Administered 2018-05-24 – 2018-05-27 (×4): 1 [drp] via OPHTHALMIC
  Filled 2018-05-24: qty 2.5

## 2018-05-24 MED ORDER — CIPROFLOXACIN IN D5W 400 MG/200ML IV SOLN
400.0000 mg | Freq: Two times a day (BID) | INTRAVENOUS | Status: DC
  Administered 2018-05-24 – 2018-05-28 (×8): 400 mg via INTRAVENOUS
  Filled 2018-05-24 (×8): qty 200

## 2018-05-24 MED ORDER — FLUTICASONE PROPIONATE 50 MCG/ACT NA SUSP
2.0000 | Freq: Every day | NASAL | Status: DC
Start: 2018-05-24 — End: 2018-05-28
  Administered 2018-05-24 – 2018-05-28 (×5): 2 via NASAL
  Filled 2018-05-24: qty 16

## 2018-05-24 MED ORDER — ACETAMINOPHEN 650 MG RE SUPP
650.0000 mg | Freq: Four times a day (QID) | RECTAL | Status: DC | PRN
Start: 2018-05-24 — End: 2018-05-28

## 2018-05-24 MED ORDER — ONDANSETRON HCL 4 MG PO TABS: 4.0000 mg | ORAL_TABLET | Freq: Four times a day (QID) | ORAL | Status: DC | PRN

## 2018-05-24 MED ORDER — CIPROFLOXACIN IN D5W 400 MG/200ML IV SOLN
400.0000 mg | Freq: Once | INTRAVENOUS | Status: AC
  Administered 2018-05-24: 400 mg via INTRAVENOUS
  Filled 2018-05-24: qty 200

## 2018-05-24 MED ORDER — IOPAMIDOL (ISOVUE-300) INJECTION 61%
100.0000 mL | Freq: Once | INTRAVENOUS | Status: AC | PRN
  Administered 2018-05-24: 100 mL via INTRAVENOUS

## 2018-05-24 MED ORDER — LOSARTAN POTASSIUM 50 MG PO TABS
100.0000 mg | ORAL_TABLET | Freq: Every day | ORAL | Status: DC
  Administered 2018-05-24 – 2018-05-26 (×3): 100 mg via ORAL
  Filled 2018-05-24 (×3): qty 2

## 2018-05-24 MED ORDER — SODIUM CHLORIDE 0.9 % IV SOLN
INTRAVENOUS | Status: DC
  Administered 2018-05-24 – 2018-05-26 (×3): via INTRAVENOUS

## 2018-05-24 MED ORDER — ACETAMINOPHEN 325 MG PO TABS
650.0000 mg | ORAL_TABLET | Freq: Four times a day (QID) | ORAL | Status: DC | PRN
Start: 2018-05-24 — End: 2018-05-28
  Administered 2018-05-24 – 2018-05-27 (×6): 650 mg via ORAL
  Filled 2018-05-24 (×6): qty 2

## 2018-05-24 MED ORDER — ONDANSETRON HCL 4 MG/2ML IJ SOLN: 4.0000 mg | Freq: Four times a day (QID) | INTRAMUSCULAR | Status: DC | PRN

## 2018-05-24 MED ORDER — PSYLLIUM 95 % PO PACK
1.0000 | PACK | Freq: Every day | ORAL | Status: DC
  Administered 2018-05-25 – 2018-05-28 (×4): 1 via ORAL
  Filled 2018-05-24 (×5): qty 1

## 2018-05-24 MED ORDER — METRONIDAZOLE IN NACL 5-0.79 MG/ML-% IV SOLN
500.0000 mg | Freq: Once | INTRAVENOUS | Status: AC
  Administered 2018-05-24: 500 mg via INTRAVENOUS
  Filled 2018-05-24: qty 100

## 2018-05-24 MED ORDER — DORZOLAMIDE HCL-TIMOLOL MAL 2-0.5 % OP SOLN
1.0000 [drp] | Freq: Every day | OPHTHALMIC | Status: DC
  Administered 2018-05-24 – 2018-05-28 (×5): 1 [drp] via OPHTHALMIC
  Filled 2018-05-24: qty 10

## 2018-05-24 NOTE — ED Notes (Signed)
amb to BR w/o difficulty 

## 2018-05-24 NOTE — ED Notes (Signed)
Pt returned from CT °

## 2018-05-24 NOTE — Progress Notes (Addendum)
From [facility]  77 year old male history of hypertension, hyperlipidemia, peptic ulcer disease, presented to [facility] today after having bloody bowel movements since Monday, 05/22/2018.  Patient thought he was constipated and took some type of a stool softener followed by several bloody bowel movements.  He was seen by Dr. Watt Climes back in 2013. CT abd/pelvis showed acute diverticulitis; per EDP, no abd pain. Hemoglobin was 15.1 on 10/10/2017, currently 9.7.  Vital signs currently stable.  Patient accepted to observation telemetry.  Time spent: 15 minutes  Bev Drennen D.O. [facility] Pager [phone]  If 7PM-7AM, please contact night-coverage [email or web address] Password [email or web address] 05/24/2018, 2:21 PM

## 2018-05-24 NOTE — H&P (Addendum)
History and Physical    [name] KZL:[id number] DOB: 1941/07/10  DOA: 05/24/2018 PCP: Marletta Lor, MD  Patient coming from: [place]  Chief Complaint: Abdominal pain and bloody stools  HPI: David Goodman is a 77 y.o. male with medical history significant of BPH, hypertension, prior peptic ulcer diseases, GERD and constipation, presented to the [facility] complaining of bloody stools and  lower abdominal pain.  Symptoms started to 3 days ago after patient was constipated and took " baking soda" for his constipation.  Subsequently patient had loose stools with bright blood, which has persisted.  Patient denies nausea and vomiting.  Patient used to take Metamucil for constipation but has not been taking it lately and has been constipated more frequently.  Denies fever, chest pain and palpitations.  Other associated symptoms include lightheadedness.  ED Course: Hemodynamically stable, EDP rectal exam heme positive, hemoglobin 9.1, CT abdomen showed evidence of diverticulosis on the left side and evidence of diverticulitis.  Patient was started on Cipro and Flagyl.  Review of Systems:   General: no changes in body weight, no fever chills or decrease in energy.  HEENT: no blurry vision, hearing changes or sore throat Respiratory: no dyspnea, coughing, wheezing CV: no chest pain, no palpitations GI:  See HPI GU: no dysuria, burning on urination, increased urinary frequency, hematuria  Ext:. No deformities,  Neuro: no unilateral weakness, numbness, or tingling, no vision change or hearing loss Skin: No rashes, lesions or wounds. MSK: No muscle spasm, no deformity, no limitation of range of movement in spin Heme: No easy bruising.  Travel history: No recent long distant travel.   Past Medical History:  Diagnosis Date  . ALLERGIC RHINITIS 07/13/2007  . HYPERLIPIDEMIA 07/13/2007  . HYPERTENSION 07/13/2007  . 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 PEPTIC  ULCER DISEASE 07/13/2007  . 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 ROTATOR CUFF REPAIR       reports that he has never smoked. He has never used smokeless tobacco. He reports that he drinks alcohol. He reports that he does not use drugs.  Allergies  Allergen Reactions  . Lisinopril Cough    Cough  . Tramadol Hcl     REACTION: unspecified    History reviewed. No pertinent family history.  Prior to Admission medications   Medication Sig Start Date End Date Taking? Authorizing Provider  dorzolamide-timolol (COSOPT) 22.3-6.8 MG/ML ophthalmic solution Place 1 drop into the left eye daily.  04/21/18  Yes [provider]  fluticasone (FLONASE) 50 MCG/ACT nasal spray USE 2 SPRAYS IN EACH  NOSTRIL DAILY Patient taking differently: USE 2 SPRAYS IN EACH  NOSTRIL DAILY AS NEEDED FOR ALLERGIES 03/23/18  Yes Marletta Lor, MD  ibuprofen (ADVIL,MOTRIN) 200 MG tablet Take 400 mg by mouth every 6 (six) hours as needed for headache.   Yes [provider]  latanoprost (XALATAN) 0.005 % ophthalmic solution Place 1 drop into both eyes at bedtime. 06/20/17  Yes Marletta Lor, MD  losartan (COZAAR) 100 MG tablet TAKE 1 TABLET BY MOUTH  DAILY 02/20/18  Yes Marletta Lor, MD  omeprazole (PRILOSEC) 20 MG capsule TAKE 1 CAPSULE BY MOUTH  DAILY 03/23/18  Yes Marletta Lor, MD  clonazePAM (KLONOPIN) 0.5 MG tablet Take 1 tablet (0.5 mg total) by mouth 2 (two) times daily as needed. for anxiety Patient not taking: Reported on 05/24/2018 03/22/17   Marletta Lor, MD  fexofenadine (ALLEGRA) 180 MG tablet Take 1 tablet (180 mg total) by mouth daily. Patient not taking: Reported on 05/24/2018 02/02/16   Marletta Lor, MD  levocetirizine (XYZAL) 5 MG tablet TAKE 1 TABLET BY MOUTH  EVERY EVENING Patient not taking: Reported on 05/24/2018 12/30/17   Marletta Lor, MD  naproxen (NAPROSYN) 500 MG tablet TAKE  1 TABLET BY MOUTH TWO  TIMES DAILY WITH MEAL(S) Patient not taking: Reported on 05/24/2018 12/19/17   Marletta Lor, MD  tadalafil (CIALIS) 5 MG tablet Take 1 tablet (5 mg total) by mouth daily as needed for erectile dysfunction. Patient not taking: Reported on 05/24/2018 05/03/14   Marletta Lor, MD  tamsulosin [facility]) 0.4 MG CAPS capsule TAKE 1 CAPSULE BY MOUTH  DAILY Patient not taking: Reported on 05/24/2018 12/30/17   Marletta Lor, MD  Testosterone (ANDROGEL PUMP) 20.25 MG/ACT (1.62%) GEL Place 3 puffs onto the skin every morning. Patient not taking: Reported on 05/24/2018 01/19/18   Marletta Lor, MD    Physical Exam: Vitals:   05/24/18 1111 05/24/18 1430 05/24/18 1456 05/24/18 1542  BP: 135/79 134/79 138/73 (!) 158/73  Pulse: 73 76 75 70  Resp: 18 (!) 22 18 16   Temp: 98.5 F (36.9 C)  98.2 F (36.8 C) 98.4 F (36.9 C)  TempSrc: Oral  Oral Oral  SpO2: 100% 98% 99% 100%  Weight: 75.2 kg (165 lb 12.6 oz)     Height: 5' 4.5" (1.638 m)        Constitutional: NAD Eyes: PERRL, lids and conjunctivae normal ENMT: Mucous membranes are moist. Posterior pharynx clear of any exudate or lesions. Neck: normal, supple, no masses, no thyromegaly Respiratory: clear to auscultation bilaterally, no wheezing, no crackles.  Cardiovascular: Regular rate and rhythm, no murmurs / rubs / gallops. No extremity edema. 2+ pedal pulses.  Abdomen: Soft, mild distended, bilateral lower quadrant tenderness.  No organomegalies.  Positive bowel sounds Musculoskeletal: no clubbing / cyanosis. No joint deformity upper and lower extremities.  Skin: no rashes, lesions, ulcers. No induration Neurologic: CN 2-12 grossly intact.  Psychiatric: Normal judgment and insight. Alert and oriented x 3. Normal mood.    Labs on Admission: I have personally reviewed following labs and imaging studies  CBC: Recent Labs  Lab 05/24/18 1149  WBC 12.1*  NEUTROABS 9.0*  HGB 9.7*  HCT 27.6*  MCV  90.2  PLT 505   Basic Metabolic Panel: Recent Labs  Lab 05/24/18 1149  NA 135  K 3.7  CL 101  CO2 26  GLUCOSE 111*  BUN 29*  CREATININE 0.84  CALCIUM 8.2*   GFR: Estimated Creatinine Clearance: 70.2 mL/min (by C-G formula based on SCr of 0.84 mg/dL). Liver Function Tests: Recent Labs  Lab 05/24/18 1149  AST 17  ALT 12  ALKPHOS 49  BILITOT 0.3  PROT 6.3*  ALBUMIN 3.7   Recent Labs  Lab 05/24/18 1149  LIPASE 29   No results for input(s): AMMONIA in the last 168 hours. Coagulation Profile: No results for input(s): INR, PROTIME in the last 168 hours. Cardiac Enzymes: No results for input(s): CKTOTAL, CKMB, CKMBINDEX, TROPONINI in the last 168 hours. BNP (last 3 results) No results for input(s): PROBNP in the last 8760 hours. HbA1C: No results for input(s): HGBA1C in the last 72 hours. CBG: No results for input(s): GLUCAP in the last 168 hours. Lipid Profile: No results for input(s): CHOL, HDL, LDLCALC, TRIG, CHOLHDL, LDLDIRECT in the last 72 hours. Thyroid Function Tests: No results  for input(s): TSH, T4TOTAL, FREET4, T3FREE, THYROIDAB in the last 72 hours. Anemia Panel: No results for input(s): VITAMINB12, FOLATE, FERRITIN, TIBC, IRON, RETICCTPCT in the last 72 hours. Urine analysis:    Component Value Date/Time   COLORURINE LT. YELLOW 05/22/2012 [place] 05/22/2012 0824   LABSPEC 1.025 05/22/2012 0824   PHURINE 6.0 05/22/2012 0824   GLUCOSEU NEGATIVE 05/22/2012 0824   HGBUR NEGATIVE 05/22/2012 0824   HGBUR negative 07/09/2009 0852   BILIRUBINUR n 09/21/2016 0925   KETONESUR NEGATIVE 05/22/2012 0824   PROTEINUR n 09/21/2016 0925   UROBILINOGEN 0.2 09/21/2016 0925   UROBILINOGEN 0.2 05/22/2012 0824   NITRITE n 09/21/2016 0925   NITRITE NEGATIVE 05/22/2012 0824   LEUKOCYTESUR Negative 09/21/2016 0925   Sepsis Labs: !!!!!!!!!!!!!!!!!!!!!!!!!!!!!!!!!!!!!!!!!!!! @LABRCNTIP (procalcitonin:4,lacticidven:4) )No results found for this or any  previous visit (from the past 240 hour(s)).   Radiological Exams on Admission: Ct Abdomen Pelvis W Contrast  Result Date: 05/24/2018 CLINICAL DATA:  Bloody diarrhea for the past 3 days. Recent knee surgery. History of peptic ulcer disease. EXAM: CT ABDOMEN AND PELVIS WITH CONTRAST TECHNIQUE: Multidetector CT imaging of the abdomen and pelvis was performed using the standard protocol following bolus administration of intravenous contrast. CONTRAST:  138mL ISOVUE-300 IOPAMIDOL (ISOVUE-300) INJECTION 61% COMPARISON:  Abdominal ultrasound dated August 12, 2016. FINDINGS: Lower chest: No acute abnormality. Hepatobiliary: No focal liver abnormality is seen. No gallstones, gallbladder wall thickening, or biliary dilatation. Pancreas: Unremarkable. No pancreatic ductal dilatation or surrounding inflammatory changes. Spleen: Normal in size without focal abnormality. Adrenals/Urinary Tract: Adrenal glands are unremarkable. Kidneys are normal, without renal calculi, focal lesion, or hydronephrosis. Bladder is unremarkable. Stomach/Bowel: The stomach and small bowel are unremarkable. Extensive left-sided colonic diverticulosis with focal wall thickening and inflammatory stranding of the mid descending colon. There is also low-grade inflammatory stranding adjacent to the proximal sigmoid colon. Vascular/Lymphatic: Aortic atherosclerosis. Retroaortic left renal vein. No enlarged abdominal or pelvic lymph nodes. Reproductive: Mild prostatomegaly with coarse central calcifications. Other: No free fluid or pneumoperitoneum. No focal fluid collection. Musculoskeletal: No acute or significant osseous findings. Chronic bilateral L5 pars defects. No spondylolisthesis. IMPRESSION: 1. Extensive left-sided colonic diverticulosis with focal inflammation of the mid ascending colon and proximal sigmoid colon, consistent with multifocal acute diverticulitis. No perforation or abscess. 2. Aortic atherosclerosis (ICD10-I70.0). 3. 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 Electronically Signed   By: Titus Dubin M.D.   On: 05/24/2018 13:35    Assessment/Plan Lower GI bleed due to diverticulitis Admit to MedSurg, check CBC every 6 hours Hemoglobin 9.7, asymptomatic, will hold blood transfusion at this time, will obtain type and screen Continue supportive treatment.   Diverticulitis Elevated WBC, with loose stools and evidence on CT scan. Agree with Cipro and Flagyl.  Clear liquid diet.  Metamucil to avoid constipation. Check GI panel.   Hypertension BP stable Resume home medications with losartan  BPH Resume tamsulosin  DVT prophylaxis: SCDs Code Status: Full code Family Communication: None at bedside Disposition Plan: Anticipate discharge to previous home environment.  Consults called: None Admission status: MedSurg/Inpatient   Chipper Oman MD [facility] Pager: Text Page via [email or web address]  [phone]  If 7PM-7AM, please contact night-coverage [email or web address] Password [email or web address]  05/24/2018, 5:00 PM

## 2018-05-24 NOTE — Telephone Encounter (Signed)
Patient called in with c/o "rectal bleeding." He says "I was constipated a couple of days ago and took a spoon of baking soda. I started having bowel movements and noticed I was bleeding. I kept going to the bathroom about every 30 minutes and it kept waking me up during the night, blood passed with each bowel movement, a whole lot of blood, some clots. I noticed more bleeding last night when I had a bowel movement, but no diarrhea last night. This morning, I feel weak, dizzy. I checked my BP and it was 65/45. I drank 2 cups of coffee and ate a little and it came up some." I asked is he too dizzy or weak to stand up, he says "no, I'm fine walking around the house." I asked him to check his BP now, he says "it's 130/79. I haven't taken my BP medicine." I advised not to take it right now. I asked is someone there with him, he says "no, but my wife is on the way. She will be here at 1000 and take me to wherever you say I need to go." I asked about other symptoms, he says "I have abdominal pain to the lower part and dizziness, weakness." According to protocol, go to ED. Patient advised, he says "I have to wait so long when I go there. Is Dr. Raliegh Ip there today?" I advised he is in the office, but with the symptoms he's having, low BP, dizziness, weakness, it is best to go the ED to have blood work drawn, to monitor the BP and figure about about the bleeding, he verbalized understanding and says "my wife will be here and she will take me to [facility]." Care advice given, patient verbalized understanding.  Reason for Disposition . [1] MODERATE rectal bleeding (small blood clots, passing blood without stool, or toilet water turns red) AND [2] more than once a day  Answer Assessment - Initial Assessment Questions 1. APPEARANCE of BLOOD: "What color is it?" "Is it passed separately, on the surface of the stool, or mixed in with the stool?"      Blood in the water 2. AMOUNT: "How much blood was passed?"      No 3.  FREQUENCY: "How many times has blood been passed with the stools?"      Monday night, it was going every 30 minutes or so and lots of blood in each stool, some clots 4. ONSET: "When was the blood first seen in the stools?" (Days or weeks)      Day before yesterday, then again last night 5. DIARRHEA: "Is there also some diarrhea?" If so, ask: "How many diarrhea stools were passed in past 24 hours?"      No diarrhea now, but Monday diarrhea 6. CONSTIPATION: "Do you have constipation?" If so, "How bad is it?"     Not now 7. RECURRENT SYMPTOMS: "Have you had blood in your stools before?" If so, ask: "When was the last time?" and "What happened that time?"      Yes, long time ago; had a stomach ulcer 20 years ago 8. BLOOD THINNERS: "Do you take any blood thinners?" (e.g., Coumadin/warfarin, Pradaxa/dabigatran, aspirin)     No 9. OTHER SYMPTOMS: "Do you have any other symptoms?"  (e.g., abdominal pain, vomiting, dizziness, fever)     Dizziness, abdominal pain lower part of abdomen, weakness 10. PREGNANCY: "Is there any chance you are pregnant?" "When was your last menstrual period?"       N/A  Protocols used: RECTAL BLEEDING-A-AH

## 2018-05-24 NOTE — Progress Notes (Signed)
Pharmacy Antibiotic Note  David Goodman is a 77 y.o. male admitted on 05/24/2018 with diverticulitis.  Pharmacy has been consulted for ciprofloxacin dosing.  Patient presented to ED at [facility] with complaints of bloody bowel movements and some suprapubic abdominal pain. PMH of HLD, HTN, and PUD. CT abdomen shows evidence of diverticulosis. Ciprofloxacin + metronidazole received at [facility] and continued on admission.  Today, 05/24/18  WBC 12.1  Afebrile  Plan:  Ciprofloxacin 400 mg IV q12h  Height: 5' 4.5" (163.8 cm) Weight: 165 lb 12.6 oz (75.2 kg) IBW/kg (Calculated) : 60.35  Temp (24hrs), Avg:98.4 F (36.9 C), Min:98.2 F (36.8 C), Max:98.5 F (36.9 C)  Recent Labs  Lab 05/24/18 1149  WBC 12.1*  CREATININE 0.84    Estimated Creatinine Clearance: 70.2 mL/min (by C-G formula based on SCr of 0.84 mg/dL).    Allergies  Allergen Reactions  . Lisinopril Cough    Cough  . Tramadol Hcl     REACTION: unspecified   Antimicrobials this admission: ciprofloxacin 6/26 >>  Metronidazole 6/25 >>  Dose adjustments this admission:  Microbiology results: None  Thank you for allowing pharmacy to be a part of this patient's care.  Lenis Noon , PharmD, BCPS Clinical Pharmacist 05/24/2018 4:55 PM

## 2018-05-24 NOTE — ED Provider Notes (Signed)
[place] EMERGENCY DEPARTMENT Provider Note   CSN: [id number] Arrival date & time: 05/24/18  1107     History   Chief Complaint Chief Complaint  Patient presents with  . Blood In Stools    HPI David Goodman is a 77 y.o. male.  Patient with a complaint of blood in bowel movements since Monday.  Patient felt as if he was constipated took what we believe is mag citrate to help with that had a large bowel movement and has had some red blood since then.  Each day.  No abdominal pain.  Nursing wrote down that he took baking soda not so sure that is the case.  Some mild suprapubic abdominal pain since Monday but no severe abdominal pain.  Continue to have loose stools with blood.  Currently maroon in color.  Patient weak and feeling lightheaded.  Reportedly had a low blood pressure here this morning.  Upon arrival here blood pressures are fine.  Patient without a past history of any GI bleed.  Patient does have a gastroenterologist who is Dr. Watt Climes.  But he has not seen him recently.      Past Medical History:  Diagnosis Date  . ALLERGIC RHINITIS 07/13/2007  . HYPERLIPIDEMIA 07/13/2007  . HYPERTENSION 07/13/2007  . 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 PEPTIC ULCER DISEASE 07/13/2007  . 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 Impaired glucose tolerance 09/27/2016  . GERD (gastroesophageal reflux disease) 09/27/2016  . BPH (benign prostatic hyperplasia) 06/18/2013  . 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 Dyslipidemia 07/13/2007  . Essential hypertension 07/13/2007  . Allergic rhinitis 07/13/2007  . PEPTIC ULCER DISEASE 07/13/2007  . 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 ROTATOR CUFF REPAIR          Home  Medications    Prior to Admission medications   Medication Sig Start Date End Date Taking? Authorizing Provider  clonazePAM (KLONOPIN) 0.5 MG tablet Take 1 tablet (0.5 mg total) by mouth 2 (two) times daily as needed. for anxiety 03/22/17   Marletta Lor, MD  fexofenadine (ALLEGRA) 180 MG tablet Take 1 tablet (180 mg total) by mouth daily. 02/02/16   Marletta Lor, MD  fluticasone [facility]) 50 MCG/ACT nasal spray USE 2 SPRAYS IN [facility]  NOSTRIL DAILY 03/23/18   Marletta Lor, MD  latanoprost (XALATAN) 0.005 % ophthalmic solution Place 1 drop into both eyes at bedtime. 06/20/17   Marletta Lor, MD  levocetirizine (XYZAL) 5 MG tablet TAKE 1 TABLET BY MOUTH  EVERY EVENING 12/30/17   Marletta Lor, MD  losartan (COZAAR) 100 MG tablet TAKE 1 TABLET BY MOUTH  DAILY 02/20/18   Marletta Lor, MD  naproxen (NAPROSYN) 500 MG tablet TAKE 1 TABLET BY MOUTH TWO  TIMES DAILY WITH MEAL(S) 12/19/17   Marletta Lor, MD  omeprazole (PRILOSEC) 20 MG capsule TAKE 1 CAPSULE BY MOUTH  DAILY 03/23/18   Marletta Lor, MD  tadalafil (CIALIS) 5 MG tablet Take 1 tablet (5 mg total) by mouth daily as needed for erectile dysfunction. 05/03/14   Marletta Lor, MD  tamsulosin (FLOMAX) 0.4 MG CAPS capsule TAKE 1 CAPSULE BY MOUTH  DAILY 12/30/17   Marletta Lor, MD  Testosterone (ANDROGEL PUMP) 20.25 MG/ACT (1.62%) GEL  Place 3 puffs onto the skin every morning. 01/19/18   Marletta Lor, MD    Family History No family history on file.  Social History Social History   Tobacco Use  . Smoking status: Never Smoker  . Smokeless tobacco: Never Used  Substance Use Topics  . Alcohol use: Yes    Comment: couple times a week - wine  . Drug use: No     Allergies   Lisinopril and Tramadol hcl   Review of Systems Review of Systems  Constitutional: Negative for fever.  HENT: Negative for congestion.   Eyes: Negative for visual disturbance.  Respiratory: Negative  for shortness of breath.   Cardiovascular: Negative for chest pain.  Gastrointestinal: Positive for abdominal pain, blood in stool and constipation. Negative for nausea and vomiting.  Genitourinary: Negative for dysuria.  Musculoskeletal: Negative for back pain.  Skin: Negative for rash.  Neurological: Positive for light-headedness. Negative for syncope and headaches.  Hematological: Does not bruise/bleed easily.  Psychiatric/Behavioral: Negative for confusion.     Physical Exam Updated Vital Signs BP 135/79 (BP Location: Right Arm)   Pulse 73   Temp 98.5 F (36.9 C) (Oral)   Resp 18   Ht 1.638 m (5' 4.5")   Wt 75.2 kg (165 lb 12.6 oz)   SpO2 100%   BMI 28.02 kg/m   Physical Exam  Constitutional: He is oriented to person, place, and time. He appears well-developed and well-nourished. No distress.  HENT:  Head: Normocephalic and atraumatic.  Mouth/Throat: Oropharynx is clear and moist.  Eyes: Pupils are equal, round, and reactive to light. Conjunctivae and EOM are normal.  Neck: Normal range of motion. Neck supple.  Cardiovascular: Normal rate and regular rhythm.  Pulmonary/Chest: Effort normal and breath sounds normal. No respiratory distress.  Abdominal: Soft. Bowel sounds are normal. There is no tenderness.  Genitourinary: Rectal exam shows guaiac positive stool.  Genitourinary Comments: Rectal exam perianal without any fissure or prolapsed internal hemorrhoids or external hemorrhoids.  Rectal vault with reddish-maroon stool.  Heme positive.  Musculoskeletal: Normal range of motion. He exhibits no edema.  Neurological: He is alert and oriented to person, place, and time. No cranial nerve deficit or sensory deficit. He exhibits normal muscle tone. Coordination normal.  Skin: Skin is warm.  Nursing note and vitals reviewed.    ED Treatments / Results  Labs (all labs ordered are listed, but only abnormal results are displayed) Labs Reviewed  COMPREHENSIVE METABOLIC  PANEL - Abnormal; Notable for the following components:      Result Value   Glucose, Bld 111 (*)    BUN 29 (*)    Calcium 8.2 (*)    Total Protein 6.3 (*)    All other components within normal limits  CBC WITH DIFFERENTIAL/PLATELET - Abnormal; Notable for the following components:   WBC 12.1 (*)    RBC 3.06 (*)    Hemoglobin 9.7 (*)    HCT 27.6 (*)    Neutro Abs 9.0 (*)    All other components within normal limits  LIPASE, BLOOD  POC OCCULT BLOOD, ED    EKG None  Radiology Ct Abdomen Pelvis W Contrast  Result Date: 05/24/2018 CLINICAL DATA:  Bloody diarrhea for the past 3 days. Recent knee surgery. History of peptic ulcer disease. EXAM: CT ABDOMEN AND PELVIS WITH CONTRAST TECHNIQUE: Multidetector CT imaging of the abdomen and pelvis was performed using the standard protocol following bolus administration of intravenous contrast. CONTRAST:  176mL ISOVUE-300 IOPAMIDOL (ISOVUE-300) INJECTION 61%  COMPARISON:  Abdominal ultrasound dated August 12, 2016. FINDINGS: Lower chest: No acute abnormality. Hepatobiliary: No focal liver abnormality is seen. No gallstones, gallbladder wall thickening, or biliary dilatation. Pancreas: Unremarkable. No pancreatic ductal dilatation or surrounding inflammatory changes. Spleen: Normal in size without focal abnormality. Adrenals/Urinary Tract: Adrenal glands are unremarkable. Kidneys are normal, without renal calculi, focal lesion, or hydronephrosis. Bladder is unremarkable. Stomach/Bowel: The stomach and small bowel are unremarkable. Extensive left-sided colonic diverticulosis with focal wall thickening and inflammatory stranding of the mid descending colon. There is also low-grade inflammatory stranding adjacent to the proximal sigmoid colon. Vascular/Lymphatic: Aortic atherosclerosis. Retroaortic left renal vein. No enlarged abdominal or pelvic lymph nodes. Reproductive: Mild prostatomegaly with coarse central calcifications. Other: No free fluid or  pneumoperitoneum. No focal fluid collection. Musculoskeletal: No acute or significant osseous findings. Chronic bilateral L5 pars defects. No spondylolisthesis. IMPRESSION: 1. Extensive left-sided colonic diverticulosis with focal inflammation of the mid ascending colon and proximal sigmoid colon, consistent with multifocal acute diverticulitis. No perforation or abscess. 2. Aortic atherosclerosis (ICD10-I70.0). 3. 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 Electronically Signed   By: Titus Dubin M.D.   On: 05/24/2018 13:35    Procedures Procedures (including critical care time)  Medications Ordered in ED Medications  0.9 %  sodium chloride infusion ( Intravenous New Bag/Given 05/24/18 1220)  ciprofloxacin (CIPRO) IVPB 400 mg (has no administration in time range)  metroNIDAZOLE (FLAGYL) IVPB 500 mg (has no administration in time range)  iopamidol (ISOVUE-300) 61 % injection 100 mL (100 mLs Intravenous Contrast Given 05/24/18 1300)     Initial Impression / Assessment and Plan / ED Course  I have reviewed the triage vital signs and the nursing notes.  Pertinent labs & imaging results that were available during my care of the patient were reviewed by me and considered in my medical decision making (see chart for details).    Patient presents with concerns for significant GI bleed.  Been having red blood or maroon blood in the bowel movements since Monday.  Also some mild suprapubic abdominal discomfort but no severe abdominal pain.  Patient's hemoglobin shows a significant drop since the fall.  Hemoglobin is 9.1.  Vital signs here not hypotensive not tachycardic.  Rectal exam heme positive maroonish color stool.  CT abdomen shows evidence of diverticulosis on the left side of the colon and also shows evidence of some diverticulitis in the more proximal part of the colon.  For the diverticulitis patient started on Cipro and Flagyl.  Also has a mild leukocytosis.  For the GI  bleed patient will require admission.  Discussed with hospitalist at [facility] and they will admit to telemetry observation.  Patient does not require blood transfusion at this time.   Final Clinical Impressions(s) / ED Diagnoses   Final diagnoses:  Gastrointestinal hemorrhage, unspecified gastrointestinal hemorrhage type  Diverticulitis    ED Discharge Orders    None       Fredia Sorrow, MD 05/24/18 1525

## 2018-05-24 NOTE — ED Triage Notes (Signed)
Pt sts he was constipated and took Baking Soda on Monday and his bowels moved but there was "a lot of blood".  Also suprapubic pain since Monday.  Continues to have loose stools and blood.  Sts he is "weak and dizzy" and had low bp at home this morning.  Sent for eval by edp.

## 2018-05-24 NOTE — Telephone Encounter (Signed)
Noted. Will send to Dr Raliegh Ip as Juluis Rainier.

## 2018-05-25 DIAGNOSIS — D62 Acute posthemorrhagic anemia: Secondary | ICD-10-CM

## 2018-05-25 DIAGNOSIS — N400 Enlarged prostate without lower urinary tract symptoms: Secondary | ICD-10-CM

## 2018-05-25 DIAGNOSIS — K5793 Diverticulitis of intestine, part unspecified, without perforation or abscess with bleeding: Secondary | ICD-10-CM

## 2018-05-25 DIAGNOSIS — I10 Essential (primary) hypertension: Secondary | ICD-10-CM

## 2018-05-25 DIAGNOSIS — K5792 Diverticulitis of intestine, part unspecified, without perforation or abscess without bleeding: Secondary | ICD-10-CM | POA: Diagnosis present

## 2018-05-25 DIAGNOSIS — K219 Gastro-esophageal reflux disease without esophagitis: Secondary | ICD-10-CM

## 2018-05-25 LAB — GASTROINTESTINAL PANEL BY PCR, STOOL (REPLACES STOOL CULTURE)
ASTROVIRUS: NOT DETECTED
Adenovirus F40/41: NOT DETECTED
CYCLOSPORA CAYETANENSIS: NOT DETECTED
Campylobacter species: NOT DETECTED
Cryptosporidium: NOT DETECTED
ENTEROAGGREGATIVE E COLI (EAEC): NOT DETECTED
ENTEROTOXIGENIC E COLI (ETEC): NOT DETECTED
Entamoeba histolytica: NOT DETECTED
Enteropathogenic E coli (EPEC): NOT DETECTED
GIARDIA LAMBLIA: NOT DETECTED
NOROVIRUS GI/GII: NOT DETECTED
Plesimonas shigelloides: NOT DETECTED
Rotavirus A: NOT DETECTED
SALMONELLA SPECIES: NOT DETECTED
Sapovirus (I, II, IV, and V): NOT DETECTED
Shiga like toxin producing E coli (STEC): NOT DETECTED
Shigella/Enteroinvasive E coli (EIEC): NOT DETECTED
VIBRIO CHOLERAE: NOT DETECTED
VIBRIO SPECIES: NOT DETECTED
Yersinia enterocolitica: NOT DETECTED

## 2018-05-25 LAB — CBC
HCT: 22.3 % — ABNORMAL LOW (ref 39.0–52.0)
HEMATOCRIT: 23.2 % — AB (ref 39.0–52.0)
Hemoglobin: 7.8 g/dL — ABNORMAL LOW (ref 13.0–17.0)
Hemoglobin: 8 g/dL — ABNORMAL LOW (ref 13.0–17.0)
MCH: 31.1 pg (ref 26.0–34.0)
MCH: 30.8 pg (ref 26.0–34.0)
MCHC: 35 g/dL (ref 30.0–36.0)
MCHC: 34.5 g/dL (ref 30.0–36.0)
MCV: 88.8 fL (ref 78.0–100.0)
MCV: 89.2 fL (ref 78.0–100.0)
PLATELETS: 196 10*3/uL (ref 150–400)
PLATELETS: 218 10*3/uL (ref 150–400)
RBC: 2.51 MIL/uL — AB (ref 4.22–5.81)
RBC: 2.6 MIL/uL — ABNORMAL LOW (ref 4.22–5.81)
RDW: 13.4 % (ref 11.5–15.5)
RDW: 13.8 % (ref 11.5–15.5)
WBC: 8 10*3/uL (ref 4.0–10.5)
WBC: 8.8 10*3/uL (ref 4.0–10.5)

## 2018-05-25 LAB — BASIC METABOLIC PANEL
Anion gap: 5 (ref 5–15)
BUN: 16 mg/dL (ref 8–23)
CHLORIDE: 107 mmol/L (ref 98–111)
CO2: 24 mmol/L (ref 22–32)
CREATININE: 0.84 mg/dL (ref 0.61–1.24)
Calcium: 7.7 mg/dL — ABNORMAL LOW (ref 8.9–10.3)
GFR calc Af Amer: >60 mL/min (ref >60)
GFR calc non Af Amer: >60 mL/min (ref >60)
GLUCOSE: 159 mg/dL — AB (ref 70–99)
POTASSIUM: 3.7 mmol/L (ref 3.5–5.1)
SODIUM: 136 mmol/L (ref 135–145)

## 2018-05-25 LAB — HEMOGLOBIN AND HEMATOCRIT, BLOOD
HCT: 19.8 % — ABNORMAL LOW (ref 39.0–52.0)
HEMATOCRIT: 21.9 % — AB (ref 39.0–52.0)
Hemoglobin: 6.9 g/dL — CL (ref 13.0–17.0)
Hemoglobin: 7.8 g/dL — ABNORMAL LOW (ref 13.0–17.0)

## 2018-05-25 LAB — PREPARE RBC (CROSSMATCH)

## 2018-05-25 LAB — MAGNESIUM: MAGNESIUM: 1.8 mg/dL (ref 1.7–2.4)

## 2018-05-25 MED ORDER — TAMSULOSIN HCL 0.4 MG PO CAPS
0.4000 mg | ORAL_CAPSULE | Freq: Every day | ORAL | Status: DC
  Administered 2018-05-25 – 2018-05-28 (×4): 0.4 mg via ORAL
  Filled 2018-05-25 (×4): qty 1

## 2018-05-25 MED ORDER — PANTOPRAZOLE SODIUM 40 MG PO TBEC
40.0000 mg | DELAYED_RELEASE_TABLET | Freq: Every day | ORAL | Status: DC
  Administered 2018-05-26 – 2018-05-27 (×2): 40 mg via ORAL
  Filled 2018-05-25 (×2): qty 1

## 2018-05-25 MED ORDER — MAGNESIUM SULFATE 4 GM/100ML IV SOLN
4.0000 g | Freq: Once | INTRAVENOUS | Status: AC
  Administered 2018-05-25: 4 g via INTRAVENOUS
  Filled 2018-05-25: qty 100

## 2018-05-25 MED ORDER — POTASSIUM CHLORIDE CRYS ER 20 MEQ PO TBCR
40.0000 meq | EXTENDED_RELEASE_TABLET | Freq: Once | ORAL | Status: AC
  Administered 2018-05-25: 40 meq via ORAL
  Filled 2018-05-25: qty 2

## 2018-05-25 MED ORDER — SODIUM CHLORIDE 0.9% IV SOLUTION
Freq: Once | INTRAVENOUS | Status: AC
  Administered 2018-05-25: 03:00:00 via INTRAVENOUS

## 2018-05-25 NOTE — Progress Notes (Signed)
PROGRESS NOTE    David Goodman  YNW:[id number] DOB: 06-30-1941 DOA: 05/24/2018 PCP: Marletta Lor, MD    Brief Narrative:  Patient is a pleasant 77 year old gentleman history of BPH, hypertension, prior peptic ulcer disease, gastroesophageal reflux disease, constipation [facility] with complaints of bloody stools and lower abdominal pain.  Work-up consistent with an acute diverticulitis in the GI bleed.  Patient transferred for further evaluation and management.  Patient currently on clear liquids.  Patient still with bloody bowel movements.  Patient also noted to be anemic with hemoglobin dropping as low as 6.9 and transfuse 1 unit packed red blood cells on 05/25/2018.  GI consultation pending.   Assessment & Plan:   Principal Problem:   GI bleeding Active Problems:   Diverticulitis   Acute blood loss anemia   Essential hypertension   BPH (benign prostatic hyperplasia)   GERD (gastroesophageal reflux disease)   GI bleed  1 GI bleed probably lower GI bleed secondary to diverticular bleed Patient with presentation of bloody bowel movements.  CT abdomen and pelvis consistent with an acute diverticulitis.  Patient had some improvement overnight however this morning was having ongoing GI bleed.  Hemoglobin 6.9 from 9.7 on admission.  Last hemoglobin in November 2018 was 15.1.  Patient transfused 1 unit packed red blood cells earlier in the morning of 05/25/2018 with hemoglobin currently at 7.8.  Tolerating clear liquids.  Continue empiric IV ciprofloxacin and IV Flagyl.  Due to ongoing bleed will consult with GI for further evaluation and management.  2.  Acute blood loss anemia Secondary to problem #1.  Hemoglobin was 9.7 on admission and dropped as low as 6.9.  Patient status post 1 unit transfusion of packed red blood cells hemoglobin currently at 7.8.  GI consultation pending.  3.  Acute diverticulitis Per CT abdomen and pelvis.  Patient had presented with  lower abdominal discomfort and bloody bowel movements.  WBC trending down.  Afebrile.  Continue empiric IV ciprofloxacin and IV Flagyl.  Supportive care.  Follow.  4.  BPH Continue Flomax.  5.  Gastroesophageal reflux disease Patient on a PPI.  6.  Hypertension Continue Cozaar.    DVT prophylaxis: SCDs Code Status: Full Family Communication: Updated patient.  No family at bedside. Disposition Plan: Home once acute bleeding has resolved with improvement with acute diverticulitis and tolerating oral intake.   Consultants:   Gastroenterology pending  Procedures:   1 unit packed red blood cells 05/25/2018  CT abdomen pelvis 05/24/2018  Antimicrobials:   IV ciprofloxacin 05/24/2018  IV Flagyl 05/24/2018   Subjective: Patient states had nl BM last night, but bloody BM this morning with BRB. No CP. No sob. Lower abdominal discomfort.   Objective: Vitals:   05/24/18 2028 05/25/18 0321 05/25/18 0336 05/25/18 0555  BP:  94/63 (!) 105/54 113/72  Pulse:  62 63 64  Resp:  20 20 18   Temp:  97.6 F (36.4 C) 97.6 F (36.4 C) 98.4 F (36.9 C)  TempSrc:  Oral Oral Oral  SpO2: 99% 100% 99%   Weight:      Height:        Intake/Output Summary (Last 24 hours) at 05/25/2018 1226 Last data filed at 05/25/2018 1030 Gross per 24 hour  Intake 1840.94 ml  Output -  Net 1840.94 ml   Filed Weights   05/24/18 1111  Weight: 75.2 kg (165 lb 12.6 oz)    Examination:  General exam: Appears calm and comfortable  Respiratory system:  Clear to auscultation. Respiratory effort normal. Cardiovascular system: S1 & S2 heard, RRR. No JVD, murmurs, rubs, gallops or clicks. No pedal edema. Gastrointestinal system: Abdomen is nondistended, soft and nontender. No organomegaly or masses felt. Normal bowel sounds heard. Central nervous system: Alert and oriented. No focal neurological deficits. Extremities: Symmetric 5 x 5 power. Skin: No rashes, lesions or ulcers Psychiatry: Judgement and  insight appear normal. Mood & affect appropriate.     Data Reviewed: I have personally reviewed following labs and imaging studies  CBC: Recent Labs  Lab 05/24/18 1149 05/24/18 1730 05/24/18 2257 05/25/18 0153 05/25/18 0835  WBC 12.1* 10.5 10.5  --  8.0  NEUTROABS 9.0*  --   --   --   --   HGB 9.7* 8.5* 7.3* 6.9* 7.8*  HCT 27.6* 23.9* 20.8* 19.8* 22.3*  MCV 90.2 89.2 90.0  --  88.8  PLT 260 221 226  --  606   Basic Metabolic Panel: Recent Labs  Lab 05/24/18 1149 05/25/18 0835 05/25/18 0845  NA 135 136  --   K 3.7 3.7  --   CL 101 107  --   CO2 26 24  --   GLUCOSE 111* 159*  --   BUN 29* 16  --   CREATININE 0.84 0.84  --   CALCIUM 8.2* 7.7*  --   MG  --   --  1.8   GFR: Estimated Creatinine Clearance: 70.2 mL/min (by C-G formula based on SCr of 0.84 mg/dL). Liver Function Tests: Recent Labs  Lab 05/24/18 1149  AST 17  ALT 12  ALKPHOS 49  BILITOT 0.3  PROT 6.3*  ALBUMIN 3.7   Recent Labs  Lab 05/24/18 1149  LIPASE 29   No results for input(s): AMMONIA in the last 168 hours. Coagulation Profile: No results for input(s): INR, PROTIME in the last 168 hours. Cardiac Enzymes: No results for input(s): CKTOTAL, CKMB, CKMBINDEX, TROPONINI in the last 168 hours. BNP (last 3 results) No results for input(s): PROBNP in the last 8760 hours. HbA1C: No results for input(s): HGBA1C in the last 72 hours. CBG: No results for input(s): GLUCAP in the last 168 hours. Lipid Profile: No results for input(s): CHOL, HDL, LDLCALC, TRIG, CHOLHDL, LDLDIRECT in the last 72 hours. Thyroid Function Tests: No results for input(s): TSH, T4TOTAL, FREET4, T3FREE, THYROIDAB in the last 72 hours. Anemia Panel: No results for input(s): VITAMINB12, FOLATE, FERRITIN, TIBC, IRON, RETICCTPCT in the last 72 hours. Sepsis Labs: No results for input(s): PROCALCITON, LATICACIDVEN in the last 168 hours.  Recent Results (from the past 240 hour(s))  Gastrointestinal Panel by PCR , Stool      Status: None   Collection Time: 05/24/18  6:13 PM  Result Value Ref Range Status   Campylobacter species NOT DETECTED NOT DETECTED Final   Plesimonas shigelloides NOT DETECTED NOT DETECTED Final   Salmonella species NOT DETECTED NOT DETECTED Final   Yersinia enterocolitica NOT DETECTED NOT DETECTED Final   Vibrio species NOT DETECTED NOT DETECTED Final   Vibrio cholerae NOT DETECTED NOT DETECTED Final   Enteroaggregative E coli (EAEC) NOT DETECTED NOT DETECTED Final   Enteropathogenic E coli (EPEC) NOT DETECTED NOT DETECTED Final   Enterotoxigenic E coli (ETEC) NOT DETECTED NOT DETECTED Final   Shiga like toxin producing E coli (STEC) NOT DETECTED NOT DETECTED Final   Shigella/Enteroinvasive E coli (EIEC) NOT DETECTED NOT DETECTED Final   Cryptosporidium NOT DETECTED NOT DETECTED Final   Cyclospora cayetanensis NOT DETECTED NOT DETECTED Final  Entamoeba histolytica NOT DETECTED NOT DETECTED Final   Giardia lamblia NOT DETECTED NOT DETECTED Final   Adenovirus F40/41 NOT DETECTED NOT DETECTED Final   Astrovirus NOT DETECTED NOT DETECTED Final   Norovirus GI/GII NOT DETECTED NOT DETECTED Final   Rotavirus A NOT DETECTED NOT DETECTED Final   Sapovirus (I, II, IV, and V) NOT DETECTED NOT DETECTED Final    Comment: Performed at [facility], [address], [place], [place] [address]         Radiology Studies: Ct Abdomen Pelvis W Contrast  Result Date: 05/24/2018 CLINICAL DATA:  Bloody diarrhea for the past 3 days. Recent knee surgery. History of peptic ulcer disease. EXAM: CT ABDOMEN AND PELVIS WITH CONTRAST TECHNIQUE: Multidetector CT imaging of the abdomen and pelvis was performed using the standard protocol following bolus administration of intravenous contrast. CONTRAST:  167mL ISOVUE-300 IOPAMIDOL (ISOVUE-300) INJECTION 61% COMPARISON:  Abdominal ultrasound dated August 12, 2016. FINDINGS: Lower chest: No acute abnormality. Hepatobiliary: No focal liver abnormality is  seen. No gallstones, gallbladder wall thickening, or biliary dilatation. Pancreas: Unremarkable. No pancreatic ductal dilatation or surrounding inflammatory changes. Spleen: Normal in size without focal abnormality. Adrenals/Urinary Tract: Adrenal glands are unremarkable. Kidneys are normal, without renal calculi, focal lesion, or hydronephrosis. Bladder is unremarkable. Stomach/Bowel: The stomach and small bowel are unremarkable. Extensive left-sided colonic diverticulosis with focal wall thickening and inflammatory stranding of the mid descending colon. There is also low-grade inflammatory stranding adjacent to the proximal sigmoid colon. Vascular/Lymphatic: Aortic atherosclerosis. Retroaortic left renal vein. No enlarged abdominal or pelvic lymph nodes. Reproductive: Mild prostatomegaly with coarse central calcifications. Other: No free fluid or pneumoperitoneum. No focal fluid collection. Musculoskeletal: No acute or significant osseous findings. Chronic bilateral L5 pars defects. No spondylolisthesis. IMPRESSION: 1. Extensive left-sided colonic diverticulosis with focal inflammation of the mid ascending colon and proximal sigmoid colon, consistent with multifocal acute diverticulitis. No perforation or abscess. 2. Aortic atherosclerosis (ICD10-I70.0). 3. 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 Electronically Signed   By: Titus Dubin M.D.   On: 05/24/2018 13:35        Scheduled Meds: . dorzolamide-timolol  1 drop Left Eye Daily  . fluticasone  2 spray Each Nare Daily  . latanoprost  1 drop Both Eyes QHS  . losartan  100 mg Oral Daily  . psyllium  1 packet Oral Daily   Continuous Infusions: . sodium chloride 75 mL/hr at 05/25/18 0600  . ciprofloxacin Stopped (05/25/18 1153)  . magnesium sulfate 1 - 4 g bolus IVPB 4 g (05/25/18 1153)  . metronidazole Stopped (05/25/18 0947)     LOS: 1 day    Time spent: 35 minutes    Irine Seal, MD [facility] Pager [phone]  If 7PM-7AM, please contact night-coverage [email or web address] Password [facility] 05/25/2018, 12:26 PM

## 2018-05-25 NOTE — Consult Note (Signed)
Referring Provider:  [facility] Primary Care Physician:  Marletta Lor, MD Primary Gastroenterologist:  Dr. Watt Climes   Reason for Consultation:   GI bleed, diverticulitis   HPI: David Goodman is a 77 y.o. male with past medical history of peptic ulcer disease in the past, history of constipation initially presented to [facility] with abdominal pain and rectal bleeding. He was feeling constipated for last 4 days. He was also having left lower quadrant discomfort. He initially noted bright red blood per rectum after bowel movement on Monday . He had a normal color stool on Tuesday but Tuesday night he started having bright red blood per rectum again. CT scan on admission showed multifocal mild diverticulitis in the descending and sigmoid colon. He denies any nausea or vomiting. He takes ibuprofen chronically for pain. Denies dysphagia or odynophagia. Denied vomiting of blood.  Had colonoscopy 4-5 years ago and he has received letter  to schedule repeat colonoscopy  Past Medical History:  Diagnosis Date  . ALLERGIC RHINITIS 07/13/2007  . HYPERLIPIDEMIA 07/13/2007  . HYPERTENSION 07/13/2007  . 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 PEPTIC ULCER DISEASE 07/13/2007  . 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 ROTATOR CUFF REPAIR      Prior to Admission medications   Medication Sig Start Date End Date Taking? Authorizing Provider  dorzolamide-timolol (COSOPT) 22.3-6.8 MG/ML ophthalmic solution Place 1 drop into the left eye daily.  04/21/18  Yes [provider]  fluticasone (FLONASE) 50 MCG/ACT nasal spray USE 2 SPRAYS IN EACH  NOSTRIL DAILY Patient taking differently: USE 2 SPRAYS IN EACH  NOSTRIL DAILY AS NEEDED FOR ALLERGIES 03/23/18  Yes Marletta Lor, MD  ibuprofen (ADVIL,MOTRIN) 200 MG tablet Take 400 mg by mouth every 6 (six) hours as needed for headache.   Yes [provider]  latanoprost (XALATAN)  0.005 % ophthalmic solution Place 1 drop into both eyes at bedtime. 06/20/17  Yes Marletta Lor, MD  losartan (COZAAR) 100 MG tablet TAKE 1 TABLET BY MOUTH  DAILY 02/20/18  Yes Marletta Lor, MD  omeprazole (PRILOSEC) 20 MG capsule TAKE 1 CAPSULE BY MOUTH  DAILY 03/23/18  Yes Marletta Lor, MD  clonazePAM (KLONOPIN) 0.5 MG tablet Take 1 tablet (0.5 mg total) by mouth 2 (two) times daily as needed. for anxiety Patient not taking: Reported on 05/24/2018 03/22/17   Marletta Lor, MD  fexofenadine (ALLEGRA) 180 MG tablet Take 1 tablet (180 mg total) by mouth daily. Patient not taking: Reported on 05/24/2018 02/02/16   Marletta Lor, MD  levocetirizine (XYZAL) 5 MG tablet TAKE 1 TABLET BY MOUTH  EVERY EVENING Patient not taking: Reported on 05/24/2018 12/30/17   Marletta Lor, MD  naproxen (NAPROSYN) 500 MG tablet TAKE 1 TABLET BY MOUTH TWO  TIMES DAILY WITH MEAL(S) Patient not taking: Reported on 05/24/2018 12/19/17   Marletta Lor, MD  tadalafil (CIALIS) 5 MG tablet Take 1 tablet (5 mg total) by mouth daily as needed for erectile dysfunction. Patient not taking: Reported on 05/24/2018 05/03/14   Marletta Lor, MD  tamsulosin [facility]) 0.4 MG CAPS capsule TAKE 1 CAPSULE BY MOUTH  DAILY Patient not taking: Reported on 05/24/2018 12/30/17   Marletta Lor, MD  Testosterone (ANDROGEL PUMP) 20.25 MG/ACT (1.62%) GEL Place 3 puffs onto the skin every morning. Patient not taking: Reported on 05/24/2018 01/19/18   Marletta Lor, MD    Scheduled Meds: . dorzolamide-timolol  1 drop Left Eye Daily  . fluticasone  2 spray Each Nare Daily  . latanoprost  1 drop Both Eyes QHS  . losartan  100 mg Oral Daily  . psyllium  1 packet Oral Daily   Continuous Infusions: . sodium chloride 75 mL/hr at 05/25/18 0600  . ciprofloxacin Stopped (05/25/18 1153)  . metronidazole Stopped (05/25/18 0947)   PRN Meds:.acetaminophen **OR** acetaminophen,  HYDROcodone-acetaminophen, ondansetron **OR** ondansetron (ZOFRAN) IV  Allergies as of 05/24/2018 - Review Complete 05/24/2018  Allergen Reaction Noted  . Lisinopril Cough 06/06/2015  . Tramadol hcl  02/23/2007    History reviewed. No pertinent family history.  Social History   Socioeconomic History  . Marital status: Married    Spouse name: Not on file  . Number of children: Not on file  . Years of education: Not on file  . Highest education level: Not on file  Occupational History  . Not on file  Social Needs  . Financial resource strain: Not on file  . Food insecurity:    Worry: Not on file    Inability: Not on file  . Transportation needs:    Medical: Not on file    Non-medical: Not on file  Tobacco Use  . Smoking status: Never Smoker  . Smokeless tobacco: Never Used  Substance and Sexual Activity  . Alcohol use: Yes    Comment: couple times a week - wine  . Drug use: No  . Sexual activity: Not on file  Lifestyle  . Physical activity:    Days per week: Not on file    Minutes per session: Not on file  . Stress: Not on file  Relationships  . Social connections:    Talks on phone: Not on file    Gets together: Not on file    Attends religious service: Not on file    Active member of club or organization: Not on file    Attends meetings of clubs or organizations: Not on file    Relationship status: Not on file  . Intimate partner violence:    Fear of current or ex partner: Not on file    Emotionally abused: Not on file    Physically abused: Not on file    Forced sexual activity: Not on file  Other Topics Concern  . Not on file  Social History Narrative  . Not on file    Review of Systems: Review of Systems  Constitutional: Negative for chills and fever.  HENT: Negative for hearing loss and tinnitus.   Eyes: Negative for blurred vision and double vision.  Respiratory: Negative for cough and hemoptysis.   Cardiovascular: Negative for chest pain and  palpitations.  Gastrointestinal: Positive for abdominal pain, blood in stool and constipation. Negative for heartburn, nausea and vomiting.  Genitourinary: Negative for dysuria and urgency.  Musculoskeletal: Positive for back pain and joint pain. Negative for myalgias.  Skin: Negative for itching and rash.  Neurological: Negative for seizures and loss of consciousness.  Endo/Heme/Allergies: Does not bruise/bleed easily.  Psychiatric/Behavioral: Negative for hallucinations and suicidal ideas.    Physical Exam: Vital signs: Vitals:   05/25/18 0336 05/25/18 0555  BP: (!) 105/54 113/72  Pulse: 63 64  Resp: 20 18  Temp: 97.6 F (36.4 C) 98.4 F (36.9 C)  SpO2: 99%      Physical Exam  Constitutional: He is oriented to person, place, and time. He appears well-developed and well-nourished. No distress.  HENT:  Head: Normocephalic and atraumatic.  Mouth/Throat: Oropharynx  is clear and moist. No oropharyngeal exudate.  Eyes: EOM are normal. No scleral icterus.  Neck: Normal range of motion. Neck supple. No thyromegaly present.  Cardiovascular: Normal rate and regular rhythm.  Murmur heard. Pulmonary/Chest: Effort normal and breath sounds normal. No respiratory distress.  Abdominal: Soft. Bowel sounds are normal. He exhibits no distension. There is tenderness. There is no rebound and no guarding.  LLQ TTP  Genitourinary:  Genitourinary Comments: Rectal exam showed dried maroon color blood on perianal area. No external hemorrhoids. No rectal mass. No blood on the finger.  Musculoskeletal: Normal range of motion. He exhibits no edema.  Neurological: He is alert and oriented to person, place, and time.  Skin: Skin is warm. No erythema.  Psychiatric: He has a normal mood and affect. Judgment normal.  Vitals reviewed.  GI:  Lab Results: Recent Labs    05/24/18 2257 05/25/18 0153 05/25/18 0835 05/25/18 1405  WBC 10.5  --  8.0 8.8  HGB 7.3* 6.9* 7.8* 8.0*  HCT 20.8* 19.8* 22.3*  23.2*  PLT 226  --  196 218   BMET Recent Labs    05/24/18 1149 05/25/18 0835  NA 135 136  K 3.7 3.7  CL 101 107  CO2 26 24  GLUCOSE 111* 159*  BUN 29* 16  CREATININE 0.84 0.84  CALCIUM 8.2* 7.7*   LFT Recent Labs    05/24/18 1149  PROT 6.3*  ALBUMIN 3.7  AST 17  ALT 12  ALKPHOS 49  BILITOT 0.3   PT/INR No results for input(s): LABPROT, INR in the last 72 hours.   Studies/Results: Ct Abdomen Pelvis W Contrast  Result Date: 05/24/2018 CLINICAL DATA:  Bloody diarrhea for the past 3 days. Recent knee surgery. History of peptic ulcer disease. EXAM: CT ABDOMEN AND PELVIS WITH CONTRAST TECHNIQUE: Multidetector CT imaging of the abdomen and pelvis was performed using the standard protocol following bolus administration of intravenous contrast. CONTRAST:  129mL ISOVUE-300 IOPAMIDOL (ISOVUE-300) INJECTION 61% COMPARISON:  Abdominal ultrasound dated August 12, 2016. FINDINGS: Lower chest: No acute abnormality. Hepatobiliary: No focal liver abnormality is seen. No gallstones, gallbladder wall thickening, or biliary dilatation. Pancreas: Unremarkable. No pancreatic ductal dilatation or surrounding inflammatory changes. Spleen: Normal in size without focal abnormality. Adrenals/Urinary Tract: Adrenal glands are unremarkable. Kidneys are normal, without renal calculi, focal lesion, or hydronephrosis. Bladder is unremarkable. Stomach/Bowel: The stomach and small bowel are unremarkable. Extensive left-sided colonic diverticulosis with focal wall thickening and inflammatory stranding of the mid descending colon. There is also low-grade inflammatory stranding adjacent to the proximal sigmoid colon. Vascular/Lymphatic: Aortic atherosclerosis. Retroaortic left renal vein. No enlarged abdominal or pelvic lymph nodes. Reproductive: Mild prostatomegaly with coarse central calcifications. Other: No free fluid or pneumoperitoneum. No focal fluid collection. Musculoskeletal: No acute or significant  osseous findings. Chronic bilateral L5 pars defects. No spondylolisthesis. IMPRESSION: 1. Extensive left-sided colonic diverticulosis with focal inflammation of the mid ascending colon and proximal sigmoid colon, consistent with multifocal acute diverticulitis. No perforation or abscess. 2. Aortic atherosclerosis (ICD10-I70.0). 3. 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 Electronically Signed   By: Titus Dubin M.D.   On: 05/24/2018 13:35    Impression/Plan: - lower GI bleeding which started after constipation.most likely hemorrhoidal bleeding. Bowel movement this morning was normal color according to patient but he continues to see bright blood on the tissue paper  - diverticulitis. - Acute blood loss anemia - NSAIDs use.   Recommendations ------------------------- - CT scan findings discussed with radiology personally. He does have  evidence of mild multifocal diverticulitis but there is no evidence of active bleeding based on the contrasted CT scan. -  having normal-colored bowel movements now. Colonoscopy is contraindicated during episodes of acute diverticulitis.if evidence of ongoing drop in hemoglobin, recommend GI bleeding scan for further evaluation - He was advised to avoid NSAIDs. - Recommend outpatient colonoscopy in 6-8 weeks once acute issues are resolved. - advance diet to full liquid. Repeat CBC in the morning. GI will follow   LOS: 1 day   Otis Brace  MD, FACP 05/25/2018, 3:05 PM  Contact #  [phone]

## 2018-05-25 NOTE — Progress Notes (Signed)
CRITICAL VALUE ALERT  Critical Value:  Hemoglobin 6.9  Date & Time Notied:  05/25/18 0215  Provider Notified: Schorr  Orders Received/Actions taken: 1 unit PRBC's

## 2018-05-26 ENCOUNTER — Inpatient Hospital Stay (HOSPITAL_COMMUNITY): Payer: Medicare Other

## 2018-05-26 LAB — CBC
HCT: 20.1 % — ABNORMAL LOW (ref 39.0–52.0)
HCT: 19.4 % — ABNORMAL LOW (ref 39.0–52.0)
HEMOGLOBIN: 7.2 g/dL — AB (ref 13.0–17.0)
Hemoglobin: 6.8 g/dL — CL (ref 13.0–17.0)
MCH: 31.9 pg (ref 26.0–34.0)
MCH: 31.3 pg (ref 26.0–34.0)
MCHC: 35.8 g/dL (ref 30.0–36.0)
MCHC: 35.1 g/dL (ref 30.0–36.0)
MCV: 88.9 fL (ref 78.0–100.0)
MCV: 89.4 fL (ref 78.0–100.0)
PLATELETS: 195 10*3/uL (ref 150–400)
PLATELETS: 202 10*3/uL (ref 150–400)
RBC: 2.26 MIL/uL — ABNORMAL LOW (ref 4.22–5.81)
RBC: 2.17 MIL/uL — AB (ref 4.22–5.81)
RDW: 13.8 % (ref 11.5–15.5)
RDW: 13.7 % (ref 11.5–15.5)
WBC: 6.1 10*3/uL (ref 4.0–10.5)
WBC: 6.2 10*3/uL (ref 4.0–10.5)

## 2018-05-26 LAB — PREPARE RBC (CROSSMATCH)

## 2018-05-26 LAB — BASIC METABOLIC PANEL
Anion gap: 3 — ABNORMAL LOW (ref 5–15)
BUN: 10 mg/dL (ref 8–23)
CALCIUM: 7.9 mg/dL — AB (ref 8.9–10.3)
CO2: 25 mmol/L (ref 22–32)
Chloride: 113 mmol/L — ABNORMAL HIGH (ref 98–111)
Creatinine, Ser: 0.81 mg/dL (ref 0.61–1.24)
Glucose, Bld: 113 mg/dL — ABNORMAL HIGH (ref 70–99)
Potassium: 4 mmol/L (ref 3.5–5.1)
SODIUM: 141 mmol/L (ref 135–145)

## 2018-05-26 LAB — MAGNESIUM: MAGNESIUM: 2 mg/dL (ref 1.7–2.4)

## 2018-05-26 MED ORDER — FUROSEMIDE 10 MG/ML IJ SOLN
20.0000 mg | Freq: Once | INTRAMUSCULAR | Status: AC
  Administered 2018-05-26: 20 mg via INTRAVENOUS
  Filled 2018-05-26 (×2): qty 2

## 2018-05-26 MED ORDER — SODIUM CHLORIDE 0.9% IV SOLUTION
Freq: Once | INTRAVENOUS | Status: AC
  Administered 2018-05-26: 12:00:00 via INTRAVENOUS

## 2018-05-26 MED ORDER — HYDROCORTISONE ACETATE 25 MG RE SUPP
25.0000 mg | Freq: Two times a day (BID) | RECTAL | Status: DC
  Administered 2018-05-26 – 2018-05-28 (×4): 25 mg via RECTAL
  Filled 2018-05-26 (×5): qty 1

## 2018-05-26 MED ORDER — DIPHENHYDRAMINE HCL 50 MG/ML IJ SOLN
12.5000 mg | Freq: Once | INTRAMUSCULAR | Status: AC
  Administered 2018-05-26: 12.5 mg via INTRAVENOUS
  Filled 2018-05-26: qty 1

## 2018-05-26 MED ORDER — TECHNETIUM TC 99M-LABELED RED BLOOD CELLS IV KIT
23.9000 | PACK | Freq: Once | INTRAVENOUS | Status: AC | PRN
  Administered 2018-05-26: 23.9 via INTRAVENOUS

## 2018-05-26 MED ORDER — ACETAMINOPHEN 325 MG PO TABS
650.0000 mg | ORAL_TABLET | Freq: Once | ORAL | Status: AC
  Administered 2018-05-26: 650 mg via ORAL
  Filled 2018-05-26: qty 2

## 2018-05-26 NOTE — Care Management Important Message (Signed)
Important Message  Patient Details  Name: David Goodman MRN: [id number] Date of Birth: [birth year]   Medicare Important Message Given:  Yes    [name] 05/26/2018, 9:56 AMImportant Message  Patient Details  Name: David Goodman MRN: [id number] Date of Birth: [birth year]   Medicare Important Message Given:  Yes    [name] 05/26/2018, 9:56 AM

## 2018-05-26 NOTE — Progress Notes (Signed)
CRITICAL VALUE ALERT  Critical Value:  hgb 6.8  Date & Time Notied:  05/26/18 11:10  Provider Notified: yes, via text page  Orders Received/Actions taken: will recheck after 2 units of PRBC;s

## 2018-05-26 NOTE — Progress Notes (Signed)
PROGRESS NOTE    David Goodman  OFB:[id number] DOB: 1941-03-21 DOA: 05/24/2018 PCP: Marletta Lor, MD    Brief Narrative:  Patient is a pleasant 77 year old gentleman history of BPH, hypertension, prior peptic ulcer disease, gastroesophageal reflux disease, constipation presented to [facility] ED with complaints of bloody stools and lower abdominal pain.  Work-up consistent with an acute diverticulitis in the GI bleed.  Patient transferred for further evaluation and management.  Patient currently on clear liquids.  Patient still with bloody bowel movements.  Patient also noted to be anemic with hemoglobin dropping as low as 6.9 and transfuse 1 unit packed red blood cells on 05/25/2018.  GI consultation pending.   Assessment & Plan:   Principal Problem:   GI bleeding Active Problems:   Diverticulitis   Acute blood loss anemia   Essential hypertension   BPH (benign prostatic hyperplasia)   GERD (gastroesophageal reflux disease)   GI bleed  1 GI bleed probably lower GI bleed secondary to diverticular bleed Patient with presentation of bloody bowel movements.  CT abdomen and pelvis consistent with an acute diverticulitis.  Patient had some improvement overnight however stated had a bloody bowel movement this morning after 2 normal bowel movements.  Hemoglobin currently at 6.8 from 7.8 from 6.9 from 9.7 on admission.   Last hemoglobin in November 2018 was 15.1.  Patient transfused 1 unit packed red blood cells earlier in the morning of 05/25/2018.  Patient on full liquid diet and tolerating.  Hemoglobin currently at 6.8 and as such we will transfuse 2 units packed red blood cells.  We will get a GI bleeding scan.  Continue full liquids. Continue empiric IV ciprofloxacin and IV Flagyl.  Patient seen in consultation by GI.    2.  Acute blood loss anemia Secondary to problem #1.  Hemoglobin was 9.7 on admission and dropped as low as 6.9 on 05/25/2018.  Patient status post 1 unit  transfusion of packed red blood cells on 05/25/2018.  Hemoglobin currently at 6.8.  Patient stated had a bowel movement this morning with some blood in it.  Patient with complaints of dizziness.  Transfused 2 units packed red blood cells.  Check a GI bleeding scan.  GI consulted.    3.  Acute diverticulitis Per CT abdomen and pelvis.  Patient had presented with lower abdominal discomfort and bloody bowel movements.  WBC trending down.  Afebrile.  Clinical improvement.  Continue empiric IV ciprofloxacin and IV Flagyl.  Tolerating full liquid diet.  Supportive care.  Follow.  4.  BPH Continue Flomax.  5.  Gastroesophageal reflux disease Continue PPI.   6.  Hypertension Blood pressure with systolics in the low 782U.  Discontinue Cozaar.     DVT prophylaxis: SCDs Code Status: Full Family Communication: Updated patient.  No family at bedside. Disposition Plan: Home once acute bleeding has resolved with improvement with acute diverticulitis and tolerating oral intake with stabilization of H&H.   Consultants:   Gastroenterology: Dr. Alessandra Bevels 05/25/2018  Procedures:   1 unit packed red blood cells 05/25/2018  CT abdomen pelvis 05/24/2018  2 units packed red blood cells 05/26/2018 pending  Tagged red blood cell study pending  Antimicrobials:   IV ciprofloxacin 05/24/2018  IV Flagyl 05/24/2018   Subjective: Patient in bed.  Patient states had normal bowel movement this morning however had a bloody bowel movement.  No nausea or vomiting.  Complaining of dizziness and lightheadedness.  Had a headache last night behind the right eye which improved  on Tylenol.  Tolerating full liquid diet.  Abdominal pain improving.     Objective: Vitals:   05/25/18 0555 05/25/18 2053 05/26/18 0553 05/26/18 1138  BP: 113/72 (!) 113/51 122/83 (!) 115/59  Pulse: 64 68 85 76  Resp: 18 18 19 19   Temp: 98.4 F (36.9 C) 98.4 F (36.9 C) 98.5 F (36.9 C) 98.1 F (36.7 C)  TempSrc: Oral Oral Oral Oral    SpO2:  100% 100% 100%  Weight:      Height:        Intake/Output Summary (Last 24 hours) at 05/26/2018 1156 Last data filed at 05/26/2018 0926 Gross per 24 hour  Intake 2480.84 ml  Output -  Net 2480.84 ml   Filed Weights   05/24/18 1111  Weight: 75.2 kg (165 lb 12.6 oz)    Examination:  General exam: Appears calm and comfortable  Respiratory system: Lungs CTA B no wheezes, no crackles, no rhonchi.  Respiratory effort normal. Cardiovascular system: Regular rate rhythm no murmurs rubs or gallops.  No JVD.  No lower extremity edema.  Gastrointestinal system: Abdomen is soft, nontender, nondistended, positive bowel sounds.  No rebound.  No guarding.   Central nervous system: Alert and oriented. No focal neurological deficits. Extremities: Symmetric 5 x 5 power. Skin: No rashes, lesions or ulcers Psychiatry: Judgement and insight appear normal. Mood & affect appropriate.     Data Reviewed: I have personally reviewed following labs and imaging studies  CBC: Recent Labs  Lab 05/24/18 1149  05/24/18 2257  05/25/18 0835 05/25/18 1405 05/25/18 1534 05/26/18 0541 05/26/18 1044  WBC 12.1*   < > 10.5  --  8.0 8.8  --  6.1 6.2  NEUTROABS 9.0*  --   --   --   --   --   --   --   --   HGB 9.7*   < > 7.3*   < > 7.8* 8.0* 7.8* 7.2* 6.8*  HCT 27.6*   < > 20.8*   < > 22.3* 23.2* 21.9* 20.1* 19.4*  MCV 90.2   < > 90.0  --  88.8 89.2  --  88.9 89.4  PLT 260   < > 226  --  196 218  --  195 202   < > = values in this interval not displayed.   Basic Metabolic Panel: Recent Labs  Lab 05/24/18 1149 05/25/18 0835 05/25/18 0845 05/26/18 0541  NA 135 136  --  141  K 3.7 3.7  --  4.0  CL 101 107  --  113*  CO2 26 24  --  25  GLUCOSE 111* 159*  --  113*  BUN 29* 16  --  10  CREATININE 0.84 0.84  --  0.81  CALCIUM 8.2* 7.7*  --  7.9*  MG  --   --  1.8 2.0   GFR: Estimated Creatinine Clearance: 72.8 mL/min (by C-G formula based on SCr of 0.81 mg/dL). Liver Function Tests: Recent  Labs  Lab 05/24/18 1149  AST 17  ALT 12  ALKPHOS 49  BILITOT 0.3  PROT 6.3*  ALBUMIN 3.7   Recent Labs  Lab 05/24/18 1149  LIPASE 29   No results for input(s): AMMONIA in the last 168 hours. Coagulation Profile: No results for input(s): INR, PROTIME in the last 168 hours. Cardiac Enzymes: No results for input(s): CKTOTAL, CKMB, CKMBINDEX, TROPONINI in the last 168 hours. BNP (last 3 results) No results for input(s): PROBNP in the last 8760 hours. HbA1C:  No results for input(s): HGBA1C in the last 72 hours. CBG: No results for input(s): GLUCAP in the last 168 hours. Lipid Profile: No results for input(s): CHOL, HDL, LDLCALC, TRIG, CHOLHDL, LDLDIRECT in the last 72 hours. Thyroid Function Tests: No results for input(s): TSH, T4TOTAL, FREET4, T3FREE, THYROIDAB in the last 72 hours. Anemia Panel: No results for input(s): VITAMINB12, FOLATE, FERRITIN, TIBC, IRON, RETICCTPCT in the last 72 hours. Sepsis Labs: No results for input(s): PROCALCITON, LATICACIDVEN in the last 168 hours.  Recent Results (from the past 240 hour(s))  Gastrointestinal Panel by PCR , Stool     Status: None   Collection Time: 05/24/18  6:13 PM  Result Value Ref Range Status   Campylobacter species NOT DETECTED NOT DETECTED Final   Plesimonas shigelloides NOT DETECTED NOT DETECTED Final   Salmonella species NOT DETECTED NOT DETECTED Final   Yersinia enterocolitica NOT DETECTED NOT DETECTED Final   Vibrio species NOT DETECTED NOT DETECTED Final   Vibrio cholerae NOT DETECTED NOT DETECTED Final   Enteroaggregative E coli (EAEC) NOT DETECTED NOT DETECTED Final   Enteropathogenic E coli (EPEC) NOT DETECTED NOT DETECTED Final   Enterotoxigenic E coli (ETEC) NOT DETECTED NOT DETECTED Final   Shiga like toxin producing E coli (STEC) NOT DETECTED NOT DETECTED Final   Shigella/Enteroinvasive E coli (EIEC) NOT DETECTED NOT DETECTED Final   Cryptosporidium NOT DETECTED NOT DETECTED Final   Cyclospora  cayetanensis NOT DETECTED NOT DETECTED Final   Entamoeba histolytica NOT DETECTED NOT DETECTED Final   Giardia lamblia NOT DETECTED NOT DETECTED Final   Adenovirus F40/41 NOT DETECTED NOT DETECTED Final   Astrovirus NOT DETECTED NOT DETECTED Final   Norovirus GI/GII NOT DETECTED NOT DETECTED Final   Rotavirus A NOT DETECTED NOT DETECTED Final   Sapovirus (I, II, IV, and V) NOT DETECTED NOT DETECTED Final    Comment: Performed at [facility], [address] [place], [place] [address]         Radiology Studies: Ct Abdomen Pelvis W Contrast  Result Date: 05/24/2018 CLINICAL DATA:  Bloody diarrhea for the past 3 days. Recent knee surgery. History of peptic ulcer disease. EXAM: CT ABDOMEN AND PELVIS WITH CONTRAST TECHNIQUE: Multidetector CT imaging of the abdomen and pelvis was performed using the standard protocol following bolus administration of intravenous contrast. CONTRAST:  158mL ISOVUE-300 IOPAMIDOL (ISOVUE-300) INJECTION 61% COMPARISON:  Abdominal ultrasound dated August 12, 2016. FINDINGS: Lower chest: No acute abnormality. Hepatobiliary: No focal liver abnormality is seen. No gallstones, gallbladder wall thickening, or biliary dilatation. Pancreas: Unremarkable. No pancreatic ductal dilatation or surrounding inflammatory changes. Spleen: Normal in size without focal abnormality. Adrenals/Urinary Tract: Adrenal glands are unremarkable. Kidneys are normal, without renal calculi, focal lesion, or hydronephrosis. Bladder is unremarkable. Stomach/Bowel: The stomach and small bowel are unremarkable. Extensive left-sided colonic diverticulosis with focal wall thickening and inflammatory stranding of the mid descending colon. There is also low-grade inflammatory stranding adjacent to the proximal sigmoid colon. Vascular/Lymphatic: Aortic atherosclerosis. Retroaortic left renal vein. No enlarged abdominal or pelvic lymph nodes. Reproductive: Mild prostatomegaly with coarse central  calcifications. Other: No free fluid or pneumoperitoneum. No focal fluid collection. Musculoskeletal: No acute or significant osseous findings. Chronic bilateral L5 pars defects. No spondylolisthesis. IMPRESSION: 1. Extensive left-sided colonic diverticulosis with focal inflammation of the mid ascending colon and proximal sigmoid colon, consistent with multifocal acute diverticulitis. No perforation or abscess. 2. Aortic atherosclerosis (ICD10-I70.0). 3. 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 Electronically Signed   By: Orville Govern.D.  On: 05/24/2018 13:35        Scheduled Meds: . sodium chloride   Intravenous Once  . dorzolamide-timolol  1 drop Left Eye Daily  . fluticasone  2 spray Each Nare Daily  . furosemide  20 mg Intravenous Once  . hydrocortisone  25 mg Rectal BID  . latanoprost  1 drop Both Eyes QHS  . losartan  100 mg Oral Daily  . pantoprazole  40 mg Oral Q0600  . psyllium  1 packet Oral Daily  . tamsulosin  0.4 mg Oral Daily   Continuous Infusions: . ciprofloxacin Stopped (05/26/18 1058)  . metronidazole Stopped (05/26/18 0926)     LOS: 2 days    Time spent: 35 minutes    Irine Seal, MD [facility] Pager [phone]  If 7PM-7AM, please contact night-coverage [email or web address] Password [facility] 05/26/2018, 11:56 AM

## 2018-05-26 NOTE — Progress Notes (Signed)
[facility]ology Progress Note  David Goodman 77 y.o. 08-Jun-1941  CC:  Rectal bleeding/diverticulitis   Subjective: bowel movement this morning was normal color according to patient. He continues to see a small amount of bright blood on the tissue paper. Abdominal pain is resolved. Denied nausea or vomiting.  ROS : negative for chest pain and shortness of breath   Objective: Vital signs in last 24 hours: Vitals:   05/25/18 2053 05/26/18 0553  BP: (!) 113/51 122/83  Pulse: 68 85  Resp: 18 19  Temp: 98.4 F (36.9 C) 98.5 F (36.9 C)  SpO2: 100% 100%    Physical Exam:  General:  Alert, cooperative, no distress, appears stated age  Head:  Normocephalic, without obvious abnormality, atraumatic  Eyes:  , EOM's intact,   Lungs:   Clear to auscultation bilaterally, respirations unlabored  Heart:  Regular rate and rhythm,murmur noted  Abdomen:   Soft, non-tender, nondistended, bowel sounds present. No peritoneal signs  Extremities: Extremities normal, atraumatic, no  edema  Pulses: 2+ and symmetric    Lab Results: Recent Labs    05/25/18 0835 05/25/18 0845 05/26/18 0541  NA 136  --  141  K 3.7  --  4.0  CL 107  --  113*  CO2 24  --  25  GLUCOSE 159*  --  113*  BUN 16  --  10  CREATININE 0.84  --  0.81  CALCIUM 7.7*  --  7.9*  MG  --  1.8 2.0   Recent Labs    05/24/18 1149  AST 17  ALT 12  ALKPHOS 49  BILITOT 0.3  PROT 6.3*  ALBUMIN 3.7   Recent Labs    05/24/18 1149  05/25/18 1405 05/25/18 1534 05/26/18 0541  WBC 12.1*   < > 8.8  --  6.1  NEUTROABS 9.0*  --   --   --   --   HGB 9.7*   < > 8.0* 7.8* 7.2*  HCT 27.6*   < > 23.2* 21.9* 20.1*  MCV 90.2   < > 89.2  --  88.9  PLT 260   < > 218  --  195   < > = values in this interval not displayed.   No results for input(s): LABPROT, INR in the last 72 hours.    Assessment/Plan: - lower GI bleeding which started after constipation.most likely hemorrhoidal bleeding. Bowel movement this morning was  normal color according to patient but he continues to see bright blood on the tissue paper  - diverticulitis. - Acute blood loss anemia - NSAIDs use.   Recommendations ------------------------- - mild drop in hemoglobin noted this morning but he is having normal color stool now. - recheck CBC at 11 am  - Start Anusol suppository. - If continues to have drop in hemoglobin, recommend GI bleeding scan for further evaluation and if positive ,interventional radiology consultation.  - Colonoscopy is contraindicated during episodes of acute diverticulitis. - He was advised to avoid NSAIDs.continue antibiotics for total of 10-14 days. - Recommend outpatient colonoscopy in 6-8 weeks once acute issues are resolved. - continue full liquid diet. GI will follow     Otis Brace MD, [place] 05/26/2018, 7:53 AM  Contact #  [phone]

## 2018-05-27 LAB — TYPE AND SCREEN
ABO/RH(D): A POS
ANTIBODY SCREEN: NEGATIVE
UNIT DIVISION: 0
UNIT DIVISION: 0
Unit division: 0

## 2018-05-27 LAB — BASIC METABOLIC PANEL
Anion gap: 7 (ref 5–15)
BUN: 9 mg/dL (ref 8–23)
CO2: 25 mmol/L (ref 22–32)
CREATININE: 0.83 mg/dL (ref 0.61–1.24)
Calcium: 8.4 mg/dL — ABNORMAL LOW (ref 8.9–10.3)
Chloride: 107 mmol/L (ref 98–111)
GLUCOSE: 103 mg/dL — AB (ref 70–99)
Potassium: 4 mmol/L (ref 3.5–5.1)
Sodium: 139 mmol/L (ref 135–145)

## 2018-05-27 LAB — BPAM RBC
BLOOD PRODUCT EXPIRATION DATE: 201907132359
BLOOD PRODUCT EXPIRATION DATE: 201907252359
Blood Product Expiration Date: 201907252359
ISSUE DATE / TIME: 201906270315
ISSUE DATE / TIME: 201906281147
ISSUE DATE / TIME: 201906281943
UNIT TYPE AND RH: 6200
Unit Type and Rh: 6200
Unit Type and Rh: 6200

## 2018-05-27 LAB — CBC
HCT: 28.3 % — ABNORMAL LOW (ref 39.0–52.0)
Hemoglobin: 10 g/dL — ABNORMAL LOW (ref 13.0–17.0)
MCH: 31.2 pg (ref 26.0–34.0)
MCHC: 35.3 g/dL (ref 30.0–36.0)
MCV: 88.2 fL (ref 78.0–100.0)
Platelets: 234 10*3/uL (ref 150–400)
RBC: 3.21 MIL/uL — ABNORMAL LOW (ref 4.22–5.81)
RDW: 13.9 % (ref 11.5–15.5)
WBC: 7.1 10*3/uL (ref 4.0–10.5)

## 2018-05-27 LAB — HEMOGLOBIN AND HEMATOCRIT, BLOOD
HCT: 29 % — ABNORMAL LOW (ref 39.0–52.0)
Hemoglobin: 10.2 g/dL — ABNORMAL LOW (ref 13.0–17.0)

## 2018-05-27 MED ORDER — FAMOTIDINE 20 MG PO TABS
20.0000 mg | ORAL_TABLET | Freq: Every day | ORAL | Status: DC
  Administered 2018-05-28: 20 mg via ORAL
  Filled 2018-05-27: qty 1

## 2018-05-27 NOTE — Progress Notes (Signed)
Patient stated that Protonix gave him a HA.  MD aware from this morning.  Yesterday patient stated he needed to be premedicated prior to the blood because he gets HA.  Now patient stated the Flagyl gave him a HA.  Tylenol given per PRN order.  Patient alert and oriented and states the Tylenol helps. MD notified via text page.

## 2018-05-27 NOTE — Progress Notes (Signed)
[facility] Progress Note  Subjective: The patient was seen in follow-up today.  He has been diagnosed with colonic diverticulosis.  Diverticulitis.  And lower GI bleeding.  He he has had a CT scan showing evidence of diverticulitis.  He had a nuclear medicine GI bleeding scan last night showing positive for GI bleeding.  Interventional radiology was consulted for consideration of angiography with embolization.  At this time it appears the patient has stabilized however and they are going to hold off on that.  Objective: Vital signs in last 24 hours: Temp:  [97.9 F (36.6 C)-98.4 F (36.9 C)] 97.9 F (36.6 C) (06/29 0517) Pulse Rate:  [68-83] 70 (06/29 0517) Resp:  [16-18] 18 (06/29 0517) BP: (110-132)/(61-75) 130/68 (06/29 0517) SpO2:  [98 %-100 %] 100 % (06/29 0517) Weight change:    PE:  No distress  Heart regular rhythm  Lungs clear  Abdomen soft nontender  Lab Results: Results for orders placed or performed during the hospital encounter of 05/24/18 (from the past 24 hour(s))  CBC     Status: Abnormal   Collection Time: 05/27/18  5:13 AM  Result Value Ref Range   WBC 7.1 4.0 - 10.5 K/uL   RBC 3.21 (L) 4.22 - 5.81 MIL/uL   Hemoglobin 10.0 (L) 13.0 - 17.0 g/dL   HCT 28.3 (L) 39.0 - 52.0 %   MCV 88.2 78.0 - 100.0 fL   MCH 31.2 26.0 - 34.0 pg   MCHC 35.3 30.0 - 36.0 g/dL   RDW 13.9 11.5 - 15.5 %   Platelets 234 150 - 400 K/uL  Basic metabolic panel     Status: Abnormal   Collection Time: 05/27/18  5:13 AM  Result Value Ref Range   Sodium 139 135 - 145 mmol/L   Potassium 4.0 3.5 - 5.1 mmol/L   Chloride 107 98 - 111 mmol/L   CO2 25 22 - 32 mmol/L   Glucose, Bld 103 (H) 70 - 99 mg/dL   BUN 9 8 - 23 mg/dL   Creatinine, Ser 0.83 0.61 - 1.24 mg/dL   Calcium 8.4 (L) 8.9 - 10.3 mg/dL   GFR calc non Af Amer >60 >60 mL/min   GFR calc Af Amer >60 >60 mL/min   Anion gap 7 5 - 15    Studies/Results: Nm Gi Blood Loss  Result Date: 05/26/2018 CLINICAL DATA:   Bright red stools for several days EXAM: NUCLEAR MEDICINE GASTROINTESTINAL BLEEDING SCAN TECHNIQUE: Sequential abdominal images were obtained following intravenous administration of Tc-68m labeled red blood cells. RADIOPHARMACEUTICALS:  23.9 mCi Tc-29m pertechnetate in-vitro labeled red cells. COMPARISON:  CT from 05/24/2018 FINDINGS: Initial hour films show no significant visualization of radiotracer within the bowel. Penile activity is noted in the midline as well as activity within the cardiac blood pool and liver and spleen. The second hour images demonstrate uptake within the bladder. There is some mild evidence of radiotracer in the distal aspect of the transverse colon/proximal descending colon in an area of previous abnormality on CT. IMPRESSION: Acute GI hemorrhage in the distal transverse colon/proximal descending colon as described. These results will be called to the ordering clinician or representative by the Radiologist Assistant, and communication documented in the PACS or zVision Dashboard. Electronically Signed   By: Inez Catalina M.D.   On: 05/26/2018 18:51      Assessment: Diverticulitis  Lower GI diverticular bleed.  Appears to have stabilized.  Plan:   Follow hemoglobin and hematocrit.  Watch for further signs of GI bleeding.  If further GI bleeding occurs proceed with angiography and embolization.  Treat diverticulitis.    SAM F Adaysha Dubinsky 05/27/2018, 12:08 PM  Pager: [phone] If no answer or after 5 PM call [phone]

## 2018-05-27 NOTE — Progress Notes (Signed)
PROGRESS NOTE    David Goodman  BMW:[id number] DOB: 01/15/1941 DOA: 05/24/2018 PCP: Marletta Lor, MD    Brief Narrative:  Patient is a pleasant 77 year old gentleman history of BPH, hypertension, prior peptic ulcer disease, gastroesophageal reflux disease, constipation presented to [facility] with complaints of bloody stools and lower abdominal pain.  Work-up consistent with an acute diverticulitis in the GI bleed.  Patient transferred for further evaluation and management.  Patient currently on clear liquids.  Patient still with bloody bowel movements.  Patient also noted to be anemic with hemoglobin dropping as low as 6.9 and transfuse 1 unit packed red blood cells on 05/25/2018.  GI consultation pending.   Assessment & Plan:   Principal Problem:   GI bleeding Active Problems:   Diverticulitis   Acute blood loss anemia   Essential hypertension   BPH (benign prostatic hyperplasia)   GERD (gastroesophageal reflux disease)   GI bleed  1 GI bleed probably lower GI bleed secondary to diverticular bleed Patient with presentation of bloody bowel movements.  CT abdomen and pelvis consistent with an acute diverticulitis.  Patient had some improvement overnight however stated had a bloody bowel movement the morning of 05/26/2018, after 2 normal bowel movements.  Hemoglobin currently at 10.0 from 6.8 from 7.8 from 6.9 from 9.7 on admission.   Last hemoglobin in November 2018 was 15.1.  Patient status post total of 3 units packed red blood cells during this hospitalization.  Patient on a full liquid diet.  Patient states no further bloody bowel movements this morning.  Patient underwent a GI bleeding scan the evening of 05/26/2018 which showed acute GI hemorrhage in the distal transverse colon/proximal descending colon.  Will consult with IR for further evaluation and management, may need angiogram.  Will make patient n.p.o.  Continue empiric IV ciprofloxacin and IV Flagyl. Patient  seen in consultation by GI.    2.  Acute blood loss anemia Secondary to problem #1.  Hemoglobin was 9.7 on admission and dropped as low as 6.9 on 05/25/2018.  Patient status post 1 unit transfusion of packed red blood cells on 05/25/2018.  Hemoglobin dropped to 6.8 on 05/26/2018.  Patient transfused 2 units packed red blood cells hemoglobin currently at 10.0.  Patient denies any further bloody bowel movements this morning.  GI bleeding scan was done the evening of 05/26/2018 which showed acute GI hemorrhage in the distal transverse colon/proximal descending colon.  Will consult with IR for further evaluation and management.  GI consulted.      3.  Acute diverticulitis Per CT abdomen and pelvis.  Patient had presented with lower abdominal discomfort and bloody bowel movements.  Improving clinically.  WBC trending down.  Afebrile.  Continue empiric IV ciprofloxacin and IV Flagyl.  Tolerating full liquid diet.  Keep n.p.o. for now while IR consultation pending.  Supportive care.  Follow.  4.  BPH Continue Flomax.  5.  Gastroesophageal reflux disease Continue PPI.   6.  Hypertension Blood pressure stable with systolic blood pressure of 130.  Continue to hold Cozaar.     DVT prophylaxis: SCDs Code Status: Full Family Communication: Updated patient.  No family at bedside. Disposition Plan: Home once acute bleeding has resolved with improvement with acute diverticulitis and tolerating oral intake with stabilization of H&H.   Consultants:   Gastroenterology: Dr. Alessandra Bevels 05/25/2018  Interventional radiology pending  Procedures:   1 unit packed red blood cells 05/25/2018  CT abdomen pelvis 05/24/2018  2 units packed red  blood cells 05/26/2018  Tagged red blood cell study 05/26/2018  Antimicrobials:   IV ciprofloxacin 05/24/2018  IV Flagyl 05/24/2018   Subjective: Patient in bed.  Patient states had normal bowel movements this morning.  Denies any bloody stools this morning.  No nausea  or emesis.  No chest pain.  No shortness of breath.  States had a headache after receiving the pill this morning which RN thinks might be Protonix.  No dizziness.  No lightheadedness.  Abdominal pain improved.  Tolerating full liquid.    Objective: Vitals:   05/26/18 1930 05/26/18 2002 05/26/18 2202 05/27/18 0517  BP: 118/61 121/67 132/75 130/68  Pulse: 83 83 77 70  Resp: 16 16 18 18   Temp: 98.4 F (36.9 C) 98.2 F (36.8 C) 98 F (36.7 C) 97.9 F (36.6 C)  TempSrc: Oral Oral Oral Oral  SpO2: 99% 100% 98% 100%  Weight:      Height:        Intake/Output Summary (Last 24 hours) at 05/27/2018 0900 Last data filed at 05/27/2018 0700 Gross per 24 hour  Intake 1569 ml  Output -  Net 1569 ml   Filed Weights   05/24/18 1111  Weight: 75.2 kg (165 lb 12.6 oz)    Examination:  General exam: NAD Respiratory system: Lungs clear to auscultation bilaterally.  No wheezes, no crackles, no rhonchi.  Normal respiratory effort. Cardiovascular system: RRR no murmurs rubs or gallops.  No JVD.  No lower extremity edema.   Gastrointestinal system: Abdomen is nontender, soft, nondistended, positive bowel sounds.  No rebound.  No guarding.   Central nervous system: Alert and oriented. No focal neurological deficits. Extremities: Symmetric 5 x 5 power. Skin: No rashes, lesions or ulcers Psychiatry: Judgement and insight appear normal. Mood & affect appropriate.     Data Reviewed: I have personally reviewed following labs and imaging studies  CBC: Recent Labs  Lab 05/24/18 1149  05/25/18 0835 05/25/18 1405 05/25/18 1534 05/26/18 0541 05/26/18 1044 05/27/18 0513  WBC 12.1*   < > 8.0 8.8  --  6.1 6.2 7.1  NEUTROABS 9.0*  --   --   --   --   --   --   --   HGB 9.7*   < > 7.8* 8.0* 7.8* 7.2* 6.8* 10.0*  HCT 27.6*   < > 22.3* 23.2* 21.9* 20.1* 19.4* 28.3*  MCV 90.2   < > 88.8 89.2  --  88.9 89.4 88.2  PLT 260   < > 196 218  --  195 202 234   < > = values in this interval not displayed.    Basic Metabolic Panel: Recent Labs  Lab 05/24/18 1149 05/25/18 0835 05/25/18 0845 05/26/18 0541 05/27/18 0513  NA 135 136  --  141 139  K 3.7 3.7  --  4.0 4.0  CL 101 107  --  113* 107  CO2 26 24  --  25 25  GLUCOSE 111* 159*  --  113* 103*  BUN 29* 16  --  10 9  CREATININE 0.84 0.84  --  0.81 0.83  CALCIUM 8.2* 7.7*  --  7.9* 8.4*  MG  --   --  1.8 2.0  --    GFR: Estimated Creatinine Clearance: 71 mL/min (by C-G formula based on SCr of 0.83 mg/dL). Liver Function Tests: Recent Labs  Lab 05/24/18 1149  AST 17  ALT 12  ALKPHOS 49  BILITOT 0.3  PROT 6.3*  ALBUMIN 3.7   Recent  Labs  Lab 05/24/18 1149  LIPASE 29   No results for input(s): AMMONIA in the last 168 hours. Coagulation Profile: No results for input(s): INR, PROTIME in the last 168 hours. Cardiac Enzymes: No results for input(s): CKTOTAL, CKMB, CKMBINDEX, TROPONINI in the last 168 hours. BNP (last 3 results) No results for input(s): PROBNP in the last 8760 hours. HbA1C: No results for input(s): HGBA1C in the last 72 hours. CBG: No results for input(s): GLUCAP in the last 168 hours. Lipid Profile: No results for input(s): CHOL, HDL, LDLCALC, TRIG, CHOLHDL, LDLDIRECT in the last 72 hours. Thyroid Function Tests: No results for input(s): TSH, T4TOTAL, FREET4, T3FREE, THYROIDAB in the last 72 hours. Anemia Panel: No results for input(s): VITAMINB12, FOLATE, FERRITIN, TIBC, IRON, RETICCTPCT in the last 72 hours. Sepsis Labs: No results for input(s): PROCALCITON, LATICACIDVEN in the last 168 hours.  Recent Results (from the past 240 hour(s))  Gastrointestinal Panel by PCR , Stool     Status: None   Collection Time: 05/24/18  6:13 PM  Result Value Ref Range Status   Campylobacter species NOT DETECTED NOT DETECTED Final   Plesimonas shigelloides NOT DETECTED NOT DETECTED Final   Salmonella species NOT DETECTED NOT DETECTED Final   Yersinia enterocolitica NOT DETECTED NOT DETECTED Final   Vibrio  species NOT DETECTED NOT DETECTED Final   Vibrio cholerae NOT DETECTED NOT DETECTED Final   Enteroaggregative E coli (EAEC) NOT DETECTED NOT DETECTED Final   Enteropathogenic E coli (EPEC) NOT DETECTED NOT DETECTED Final   Enterotoxigenic E coli (ETEC) NOT DETECTED NOT DETECTED Final   Shiga like toxin producing E coli (STEC) NOT DETECTED NOT DETECTED Final   Shigella/Enteroinvasive E coli (EIEC) NOT DETECTED NOT DETECTED Final   Cryptosporidium NOT DETECTED NOT DETECTED Final   Cyclospora cayetanensis NOT DETECTED NOT DETECTED Final   Entamoeba histolytica NOT DETECTED NOT DETECTED Final   Giardia lamblia NOT DETECTED NOT DETECTED Final   Adenovirus F40/41 NOT DETECTED NOT DETECTED Final   Astrovirus NOT DETECTED NOT DETECTED Final   Norovirus GI/GII NOT DETECTED NOT DETECTED Final   Rotavirus A NOT DETECTED NOT DETECTED Final   Sapovirus (I, II, IV, and V) NOT DETECTED NOT DETECTED Final    Comment: Performed at [facility], [address] [place], [place] [address]         Radiology Studies: Nm Gi Blood Loss  Result Date: 05/26/2018 CLINICAL DATA:  Bright red stools for several days EXAM: NUCLEAR MEDICINE GASTROINTESTINAL BLEEDING SCAN TECHNIQUE: Sequential abdominal images were obtained following intravenous administration of Tc-33m labeled red blood cells. RADIOPHARMACEUTICALS:  23.9 mCi Tc-63m pertechnetate in-vitro labeled red cells. COMPARISON:  CT from 05/24/2018 FINDINGS: Initial hour films show no significant visualization of radiotracer within the bowel. Penile activity is noted in the midline as well as activity within the cardiac blood pool and liver and spleen. The second hour images demonstrate uptake within the bladder. There is some mild evidence of radiotracer in the distal aspect of the transverse colon/proximal descending colon in an area of previous abnormality on CT. IMPRESSION: Acute GI hemorrhage in the distal transverse colon/proximal descending colon  as described. These results will be called to the ordering clinician or representative by the Radiologist Assistant, and communication documented in the PACS or zVision Dashboard. Electronically Signed   By: Inez Catalina M.D.   On: 05/26/2018 18:51        Scheduled Meds: . dorzolamide-timolol  1 drop Left Eye Daily  . fluticasone  2 spray  Each Nare Daily  . hydrocortisone  25 mg Rectal BID  . latanoprost  1 drop Both Eyes QHS  . pantoprazole  40 mg Oral Q0600  . psyllium  1 packet Oral Daily  . tamsulosin  0.4 mg Oral Daily   Continuous Infusions: . ciprofloxacin Stopped (05/26/18 2305)  . metronidazole 500 mg (05/27/18 0742)     LOS: 3 days    Time spent: 35 minutes    Irine Seal, MD [facility] Pager [phone]  If 7PM-7AM, please contact night-coverage [email or web address] Password [email or web address] 05/27/2018, 9:00 AM

## 2018-05-27 NOTE — Progress Notes (Signed)
Pt: has 5 beat VT run and  MD.Blount notified via [place].

## 2018-05-27 NOTE — Consult Note (Signed)
Chief Complaint: Patient was seen in consultation today for GI bleed  Referring Physician(s): Dr. Grandville Silos  Supervising Physician: Arne Cleveland  Patient Status: [facility] - In-pt  History of Present Illness: David Goodman is a 77 y.o. male with past medical history of HLD, HTN, peptic ulcer disease who presented to [facility] ED 05/24/18 with lower quadrant abdominal pain and rectal bleeding after 4 days of constipation.    CT Abdomen Pelvis obtained 6/26 showed: 1. Extensive left-sided colonic diverticulosis with focal inflammation of the mid ascending colon and proximal sigmoid colon, consistent with multifocal acute diverticulitis. No perforation or abscess.  GI consulted and assessed patient 6/27.  No colonoscopy planned due to acute diverticulitis and improvement in condition with no further bleeding. He continued with no further bleeding 6/28 but a small drop in HgB was noted.    NM GI Scan 6/28 showed: Acute GI hemorrhage in the distal transverse colon/proximal descending colon as described.  IR consulted for possible mesenteric angiogram with embolization.  Case reviewed by Dr. Vernard Gambles.   Past Medical History:  Diagnosis Date  . ALLERGIC RHINITIS 07/13/2007  . HYPERLIPIDEMIA 07/13/2007  . HYPERTENSION 07/13/2007  . 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 PEPTIC ULCER DISEASE 07/13/2007  . 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 ROTATOR CUFF REPAIR      Allergies: Lisinopril and Tramadol hcl  Medications: Prior to Admission medications   Medication Sig Start Date End Date Taking? Authorizing Provider  dorzolamide-timolol (COSOPT) 22.3-6.8 MG/ML ophthalmic solution Place 1 drop into the left eye daily.  04/21/18  Yes [provider]  fluticasone (FLONASE) 50 MCG/ACT nasal spray USE 2 SPRAYS IN EACH  NOSTRIL DAILY Patient taking differently: USE 2 SPRAYS IN EACH  NOSTRIL DAILY AS NEEDED FOR ALLERGIES 03/23/18   Yes Marletta Lor, MD  ibuprofen (ADVIL,MOTRIN) 200 MG tablet Take 400 mg by mouth every 6 (six) hours as needed for headache.   Yes [provider]  latanoprost (XALATAN) 0.005 % ophthalmic solution Place 1 drop into both eyes at bedtime. 06/20/17  Yes Marletta Lor, MD  losartan (COZAAR) 100 MG tablet TAKE 1 TABLET BY MOUTH  DAILY 02/20/18  Yes Marletta Lor, MD  omeprazole (PRILOSEC) 20 MG capsule TAKE 1 CAPSULE BY MOUTH  DAILY 03/23/18  Yes Marletta Lor, MD  clonazePAM (KLONOPIN) 0.5 MG tablet Take 1 tablet (0.5 mg total) by mouth 2 (two) times daily as needed. for anxiety Patient not taking: Reported on 05/24/2018 03/22/17   Marletta Lor, MD  fexofenadine (ALLEGRA) 180 MG tablet Take 1 tablet (180 mg total) by mouth daily. Patient not taking: Reported on 05/24/2018 02/02/16   Marletta Lor, MD  levocetirizine (XYZAL) 5 MG tablet TAKE 1 TABLET BY MOUTH  EVERY EVENING Patient not taking: Reported on 05/24/2018 12/30/17   Marletta Lor, MD  naproxen (NAPROSYN) 500 MG tablet TAKE 1 TABLET BY MOUTH TWO  TIMES DAILY WITH MEAL(S) Patient not taking: Reported on 05/24/2018 12/19/17   Marletta Lor, MD  tadalafil (CIALIS) 5 MG tablet Take 1 tablet (5 mg total) by mouth daily as needed for erectile dysfunction. Patient not taking: Reported on 05/24/2018 05/03/14   Marletta Lor, MD  tamsulosin [facility]) 0.4 MG CAPS capsule TAKE 1 CAPSULE BY MOUTH  DAILY Patient not taking: Reported on 05/24/2018 12/30/17   Marletta Lor, MD  Testosterone (ANDROGEL PUMP) 20.25 MG/ACT (1.62%) GEL Place 3 puffs onto the skin every  morning. Patient not taking: Reported on 05/24/2018 01/19/18   Marletta Lor, MD     History reviewed. No pertinent family history.  Social History   Socioeconomic History  . Marital status: Married    Spouse name: Not on file  . Number of children: Not on file  . Years of education: Not on file  . Highest education  level: Not on file  Occupational History  . Not on file  Social Needs  . Financial resource strain: Not on file  . Food insecurity:    Worry: Not on file    Inability: Not on file  . Transportation needs:    Medical: Not on file    Non-medical: Not on file  Tobacco Use  . Smoking status: Never Smoker  . Smokeless tobacco: Never Used  Substance and Sexual Activity  . Alcohol use: Yes    Comment: couple times a week - wine  . Drug use: No  . Sexual activity: Not on file  Lifestyle  . Physical activity:    Days per week: Not on file    Minutes per session: Not on file  . Stress: Not on file  Relationships  . Social connections:    Talks on phone: Not on file    Gets together: Not on file    Attends religious service: Not on file    Active member of club or organization: Not on file    Attends meetings of clubs or organizations: Not on file    Relationship status: Not on file  Other Topics Concern  . Not on file  Social History Narrative  . Not on file     Review of Systems: A 12 point ROS discussed and pertinent positives are indicated in the HPI above.  All other systems are negative.  Review of Systems  Constitutional: Negative for fatigue and fever.  Respiratory: Negative for cough and shortness of breath.   Cardiovascular: Negative for chest pain.  Gastrointestinal: Positive for abdominal pain, blood in stool (PTA) and constipation (PTA).  Psychiatric/Behavioral: Negative for behavioral problems and confusion.    Vital Signs: BP 130/68 (BP Location: Right Arm)   Pulse 70   Temp 97.9 F (36.6 C) (Oral)   Resp 18   Ht 5' 4.5" (1.638 m)   Wt 165 lb 12.6 oz (75.2 kg)   SpO2 100%   BMI 28.02 kg/m   Physical Exam  Constitutional: He is oriented to person, place, and time. He appears well-developed.  Cardiovascular: Normal rate, regular rhythm and normal heart sounds.  Pulmonary/Chest: Effort normal and breath sounds normal. No respiratory distress.    Abdominal: Soft. Bowel sounds are normal. He exhibits no distension. There is no tenderness. There is no guarding.  Neurological: He is alert and oriented to person, place, and time.  Skin: Skin is warm and dry.  Psychiatric: He has a normal mood and affect. His behavior is normal. Judgment and thought content normal.  Nursing note and vitals reviewed.    MD Evaluation Airway: WNL Heart: WNL Abdomen: WNL Chest/ Lungs: WNL ASA  Classification: 3 Mallampati/Airway Score: One   Imaging: Nm Gi Blood Loss  Result Date: 05/26/2018 CLINICAL DATA:  Bright red stools for several days EXAM: NUCLEAR MEDICINE GASTROINTESTINAL BLEEDING SCAN TECHNIQUE: Sequential abdominal images were obtained following intravenous administration of Tc-21m labeled red blood cells. RADIOPHARMACEUTICALS:  23.9 mCi Tc-9m pertechnetate in-vitro labeled red cells. COMPARISON:  CT from 05/24/2018 FINDINGS: Initial hour films show no significant visualization of radiotracer within  the bowel. Penile activity is noted in the midline as well as activity within the cardiac blood pool and liver and spleen. The second hour images demonstrate uptake within the bladder. There is some mild evidence of radiotracer in the distal aspect of the transverse colon/proximal descending colon in an area of previous abnormality on CT. IMPRESSION: Acute GI hemorrhage in the distal transverse colon/proximal descending colon as described. These results will be called to the ordering clinician or representative by the Radiologist Assistant, and communication documented in the PACS or zVision Dashboard. Electronically Signed   By: Inez Catalina M.D.   On: 05/26/2018 18:51   Ct Abdomen Pelvis W Contrast  Result Date: 05/24/2018 CLINICAL DATA:  Bloody diarrhea for the past 3 days. Recent knee surgery. History of peptic ulcer disease. EXAM: CT ABDOMEN AND PELVIS WITH CONTRAST TECHNIQUE: Multidetector CT imaging of the abdomen and pelvis was performed  using the standard protocol following bolus administration of intravenous contrast. CONTRAST:  162mL ISOVUE-300 IOPAMIDOL (ISOVUE-300) INJECTION 61% COMPARISON:  Abdominal ultrasound dated August 12, 2016. FINDINGS: Lower chest: No acute abnormality. Hepatobiliary: No focal liver abnormality is seen. No gallstones, gallbladder wall thickening, or biliary dilatation. Pancreas: Unremarkable. No pancreatic ductal dilatation or surrounding inflammatory changes. Spleen: Normal in size without focal abnormality. Adrenals/Urinary Tract: Adrenal glands are unremarkable. Kidneys are normal, without renal calculi, focal lesion, or hydronephrosis. Bladder is unremarkable. Stomach/Bowel: The stomach and small bowel are unremarkable. Extensive left-sided colonic diverticulosis with focal wall thickening and inflammatory stranding of the mid descending colon. There is also low-grade inflammatory stranding adjacent to the proximal sigmoid colon. Vascular/Lymphatic: Aortic atherosclerosis. Retroaortic left renal vein. No enlarged abdominal or pelvic lymph nodes. Reproductive: Mild prostatomegaly with coarse central calcifications. Other: No free fluid or pneumoperitoneum. No focal fluid collection. Musculoskeletal: No acute or significant osseous findings. Chronic bilateral L5 pars defects. No spondylolisthesis. IMPRESSION: 1. Extensive left-sided colonic diverticulosis with focal inflammation of the mid ascending colon and proximal sigmoid colon, consistent with multifocal acute diverticulitis. No perforation or abscess. 2. Aortic atherosclerosis (ICD10-I70.0). 3. 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 Electronically Signed   By: Titus Dubin M.D.   On: 05/24/2018 13:35    Labs:  CBC: Recent Labs    05/25/18 1405 05/25/18 1534 05/26/18 0541 05/26/18 1044 05/27/18 0513  WBC 8.8  --  6.1 6.2 7.1  HGB 8.0* 7.8* 7.2* 6.8* 10.0*  HCT 23.2* 21.9* 20.1* 19.4* 28.3*  PLT 218  --  195 202 234     COAGS: No results for input(s): INR, APTT in the last 8760 hours.  BMP: Recent Labs    05/24/18 1149 05/25/18 0835 05/26/18 0541 05/27/18 0513  NA 135 136 141 139  K 3.7 3.7 4.0 4.0  CL 101 107 113* 107  CO2 26 24 25 25   GLUCOSE 111* 159* 113* 103*  BUN 29* 16 10 9   CALCIUM 8.2* 7.7* 7.9* 8.4*  CREATININE 0.84 0.84 0.81 0.83  GFRNONAA >60 >60 >60 >60  GFRAA >60 >60 >60 >60    LIVER FUNCTION TESTS: Recent Labs    10/10/17 1011 05/24/18 1149  BILITOT 0.7 0.3  AST 17 17  ALT 19 12  ALKPHOS 58 49  PROT 6.9 6.3*  ALBUMIN 4.4 3.7    TUMOR MARKERS: No results for input(s): AFPTM, CEA, CA199, CHROMGRNA in the last 8760 hours.  Assessment and Plan: GI Bleed IR consulted for acute GI bleeding identified by NM scan.  Patient assessed this AM.  States he is feeling better  since admission. No further bleeding, dark stools, or abdominal pain.  He did eat breakfast this AM- grits, coffee, juice.  He is tolerating this diet.  His hemoglobin improved to 10.0 this AM after transfusion yesterday afternoon.  Discussed case with Dr. Vernard Gambles.  Hold on mesenteric angiogram this AM.  Keep NPO/liquids.  If further bleeding or drop in HgB consider proceeding with angiogram.  IR following.  Thank you for this interesting consult.  I greatly enjoyed meeting Ruffus Kamaka and look forward to participating in their care.  A copy of this report was sent to the requesting provider on this date.  Electronically Signed: Docia Barrier, PA 05/27/2018, 10:22 AM   I spent a total of 40 Minutes    in face to face in clinical consultation, greater than 50% of which was counseling/coordinating care for GI bleed.

## 2018-05-28 DIAGNOSIS — K922 Gastrointestinal hemorrhage, unspecified: Secondary | ICD-10-CM

## 2018-05-28 LAB — CBC
HCT: 29.4 % — ABNORMAL LOW (ref 39.0–52.0)
HEMOGLOBIN: 10.5 g/dL — AB (ref 13.0–17.0)
MCH: 31.7 pg (ref 26.0–34.0)
MCHC: 35.7 g/dL (ref 30.0–36.0)
MCV: 88.8 fL (ref 78.0–100.0)
Platelets: 275 10*3/uL (ref 150–400)
RBC: 3.31 MIL/uL — ABNORMAL LOW (ref 4.22–5.81)
RDW: 13.6 % (ref 11.5–15.5)
WBC: 8.8 10*3/uL (ref 4.0–10.5)

## 2018-05-28 LAB — BASIC METABOLIC PANEL
Anion gap: 6 (ref 5–15)
BUN: 8 mg/dL (ref 8–23)
CHLORIDE: 107 mmol/L (ref 98–111)
CO2: 26 mmol/L (ref 22–32)
Calcium: 8.7 mg/dL — ABNORMAL LOW (ref 8.9–10.3)
Creatinine, Ser: 1.04 mg/dL (ref 0.61–1.24)
GFR calc Af Amer: >60 mL/min (ref >60)
GFR calc non Af Amer: >60 mL/min (ref >60)
GLUCOSE: 116 mg/dL — AB (ref 70–99)
Potassium: 4.3 mmol/L (ref 3.5–5.1)
Sodium: 139 mmol/L (ref 135–145)

## 2018-05-28 LAB — MAGNESIUM: Magnesium: 1.8 mg/dL (ref 1.7–2.4)

## 2018-05-28 MED ORDER — CIPROFLOXACIN HCL 500 MG PO TABS: 500.0000 mg | ORAL_TABLET | Freq: Two times a day (BID) | ORAL | 0 refills | Status: AC

## 2018-05-28 MED ORDER — CIPROFLOXACIN HCL 500 MG PO TABS: 500.0000 mg | ORAL_TABLET | Freq: Two times a day (BID) | ORAL | Status: DC

## 2018-05-28 MED ORDER — MAGNESIUM SULFATE 4 GM/100ML IV SOLN
4.0000 g | Freq: Once | INTRAVENOUS | Status: DC
  Filled 2018-05-28: qty 100

## 2018-05-28 MED ORDER — METRONIDAZOLE 500 MG PO TABS: 500.0000 mg | ORAL_TABLET | Freq: Three times a day (TID) | ORAL | 0 refills | Status: AC

## 2018-05-28 MED ORDER — METRONIDAZOLE 500 MG PO TABS: 500.0000 mg | ORAL_TABLET | Freq: Three times a day (TID) | ORAL | Status: DC

## 2018-05-28 MED ORDER — PSYLLIUM 95 % PO PACK: 1.0000 | PACK | Freq: Every day | ORAL | Status: DC

## 2018-05-28 MED ORDER — ACETAMINOPHEN 325 MG PO TABS: 650.0000 mg | ORAL_TABLET | Freq: Four times a day (QID) | ORAL | Status: DC | PRN

## 2018-05-28 MED ORDER — HYDROCORTISONE ACETATE 25 MG RE SUPP: 25.0000 mg | Freq: Two times a day (BID) | RECTAL | 0 refills | Status: DC

## 2018-05-28 NOTE — Progress Notes (Addendum)
PROGRESS NOTE    David Goodman  SFK:[id number] DOB: [birth year] DOA: 05/24/2018 PCP: Marletta Lor, MD    Brief Narrative:  Patient is a pleasant 77 year old gentleman history of BPH, hypertension, prior peptic ulcer disease, gastroesophageal reflux disease, constipation presented to [facility] ED with complaints of bloody stools and lower abdominal pain.  Work-up consistent with an acute diverticulitis in the GI bleed.  Patient transferred for further evaluation and management.  Patient currently on clear liquids.  Patient still with bloody bowel movements.  Patient also noted to be anemic with hemoglobin dropping as low as 6.9 and transfuse 1 unit packed red blood cells on 05/25/2018.  GI consultation pending.   Assessment & Plan:   Principal Problem:   GI bleeding Active Problems:   Diverticulitis   Acute blood loss anemia   Essential hypertension   BPH (benign prostatic hyperplasia)   GERD (gastroesophageal reflux disease)   GI bleed  1 GI bleed probably lower GI bleed secondary to diverticular bleed Patient with presentation of bloody bowel movements.  CT abdomen and pelvis consistent with an acute diverticulitis.  Patient had some improvement initially, however stated had a bloody bowel movement the morning of 05/26/2018, after 2 normal bowel movements.  Hemoglobin currently at 10.5 from 10.0 from 6.8 from 7.8 from 6.9 from 9.7 on admission.   Last hemoglobin in November 2018 was 15.1.  Patient status post total of 3 units packed red blood cells during this hospitalization.  Patient currently tolerating a full liquid diet.  Patient states no further bloody bowel movements this morning.  Patient underwent a GI bleeding scan the evening of 05/26/2018 which showed acute GI hemorrhage in the distal transverse colon/proximal descending colon.  Patient was seen in consultation by IR however as patient's bloody bowel movements seem to have subsided and hemoglobin has remained  stable further evaluation of probable angiogram is on hold.  Patient tolerating full liquid diet.  Advance to a soft diet.  Change IV antibiotics to oral ciprofloxacin and Flagyl to complete course of antibiotic treatment.  GI following.    2.  Acute blood loss anemia Secondary to problem #1.  Hemoglobin was 9.7 on admission and dropped as low as 6.9 on 05/25/2018.  Patient status post 1 unit transfusion of packed red blood cells on 05/25/2018.  Hemoglobin dropped to 6.8 on 05/26/2018.  Patient transfused 2 units packed red blood cells hemoglobin currently at 10.5.  Patient denies any further bloody bowel movements this morning.  GI bleeding scan was done the evening of 05/26/2018 which showed acute GI hemorrhage in the distal transverse colon/proximal descending colon.  Patient was seen in consultation by IR on 05/27/2018.  Patient noted to have no further bloody stools and hemoglobin remained stable and as such angiogram has been placed on hold.  Patient currently stable.  Follow.     3.  Acute diverticulitis Per CT abdomen and pelvis.  Patient had presented with lower abdominal discomfort and bloody bowel movements.  Improving clinically.  WBC trending down.  Afebrile.  Change IV ciprofloxacin and IV Flagyl to oral ciprofloxacin and Flagyl.  Advance diet to a soft diet.  Supportive care.  GI following.    4.  BPH Continue Flomax.  5.  Gastroesophageal reflux disease Patient with some complaints of intolerance to PPI yesterday and as such PPI has been changed to Pepcid.    6.  Hypertension Stable.  Continue Flomax.  7.  PVCs Asymptomatic.  Keep potassium greater than 4.  Magnesium at 1.8 we will give a dose of IV magnesium to keep magnesium greater than 2.  Follow.    DVT prophylaxis: SCDs Code Status: Full Family Communication: Updated patient.  No family at bedside. Disposition Plan: Home once acute bleeding has resolved with improvement with acute diverticulitis and tolerating oral intake  with stabilization of H&H.   Consultants:   Gastroenterology: Dr. Alessandra Bevels 05/25/2018  Interventional radiology: Dr. Vernard Gambles 05/27/2018  Procedures:   1 unit packed red blood cells 05/25/2018  CT abdomen pelvis 05/24/2018  2 units packed red blood cells 05/26/2018  Tagged red blood cell study 05/26/2018  Antimicrobials:   IV ciprofloxacin 05/24/2018>>> oral ciprofloxacin 05/28/2018  IV Flagyl 05/24/2018>>>>> oral Flagyl 05/28/2018   Subjective: Patient in bed.  Patient states had brown stools this morning.  Denies any further bleeding.  No nausea or emesis.  No abdominal pain.  Feels much better.  Tolerating full liquid diet.  Objective: Vitals:   05/27/18 0517 05/27/18 1430 05/27/18 2041 05/28/18 0537  BP: 130/68 129/63 124/80 132/83  Pulse: 70 73 70 75  Resp: 18 18 15 16   Temp: 97.9 F (36.6 C) 98.6 F (37 C) 97.9 F (36.6 C) 97.9 F (36.6 C)  TempSrc: Oral Oral Oral Oral  SpO2: 100% 99% 98% 97%  Weight:      Height:        Intake/Output Summary (Last 24 hours) at 05/28/2018 1025 Last data filed at 05/28/2018 0944 Gross per 24 hour  Intake 1420 ml  Output 4 ml  Net 1416 ml   Filed Weights   05/24/18 1111  Weight: 75.2 kg (165 lb 12.6 oz)    Examination:  General exam: NAD Respiratory system: CTA B.  No wheezes, no crackles, no rhonchi.  Normal rest tori effort.  Cardiovascular system: Regular rate rhythm no murmurs rubs or gallops.  No edema.  No JVD. Gastrointestinal system: Abdomen is soft, nontender, nondistended, positive bowel sounds.  No rebound.  No guarding.   Central nervous system: Alert and oriented. No focal neurological deficits. Extremities: Symmetric 5 x 5 power. Skin: No rashes, lesions or ulcers Psychiatry: Judgement and insight appear normal. Mood & affect appropriate.     Data Reviewed: I have personally reviewed following labs and imaging studies  CBC: Recent Labs  Lab 05/24/18 1149  05/25/18 1405  05/26/18 0541 05/26/18 1044  05/27/18 0513 05/27/18 1504 05/28/18 0516  WBC 12.1*   < > 8.8  --  6.1 6.2 7.1  --  8.8  NEUTROABS 9.0*  --   --   --   --   --   --   --   --   HGB 9.7*   < > 8.0*   < > 7.2* 6.8* 10.0* 10.2* 10.5*  HCT 27.6*   < > 23.2*   < > 20.1* 19.4* 28.3* 29.0* 29.4*  MCV 90.2   < > 89.2  --  88.9 89.4 88.2  --  88.8  PLT 260   < > 218  --  195 202 234  --  275   < > = values in this interval not displayed.   Basic Metabolic Panel: Recent Labs  Lab 05/24/18 1149 05/25/18 0835 05/25/18 0845 05/26/18 0541 05/27/18 0513 05/28/18 0516  NA 135 136  --  141 139 139  K 3.7 3.7  --  4.0 4.0 4.3  CL 101 107  --  113* 107 107  CO2 26 24  --  25 25 26   GLUCOSE 111*  159*  --  113* 103* 116*  BUN 29* 16  --  10 9 8   CREATININE 0.84 0.84  --  0.81 0.83 1.04  CALCIUM 8.2* 7.7*  --  7.9* 8.4* 8.7*  MG  --   --  1.8 2.0  --  1.8   GFR: Estimated Creatinine Clearance: 56.7 mL/min (by C-G formula based on SCr of 1.04 mg/dL). Liver Function Tests: Recent Labs  Lab 05/24/18 1149  AST 17  ALT 12  ALKPHOS 49  BILITOT 0.3  PROT 6.3*  ALBUMIN 3.7   Recent Labs  Lab 05/24/18 1149  LIPASE 29   No results for input(s): AMMONIA in the last 168 hours. Coagulation Profile: No results for input(s): INR, PROTIME in the last 168 hours. Cardiac Enzymes: No results for input(s): CKTOTAL, CKMB, CKMBINDEX, TROPONINI in the last 168 hours. BNP (last 3 results) No results for input(s): PROBNP in the last 8760 hours. HbA1C: No results for input(s): HGBA1C in the last 72 hours. CBG: No results for input(s): GLUCAP in the last 168 hours. Lipid Profile: No results for input(s): CHOL, HDL, LDLCALC, TRIG, CHOLHDL, LDLDIRECT in the last 72 hours. Thyroid Function Tests: No results for input(s): TSH, T4TOTAL, FREET4, T3FREE, THYROIDAB in the last 72 hours. Anemia Panel: No results for input(s): VITAMINB12, FOLATE, FERRITIN, TIBC, IRON, RETICCTPCT in the last 72 hours. Sepsis Labs: No results for input(s):  PROCALCITON, LATICACIDVEN in the last 168 hours.  Recent Results (from the past 240 hour(s))  Gastrointestinal Panel by PCR , Stool     Status: None   Collection Time: 05/24/18  6:13 PM  Result Value Ref Range Status   Campylobacter species NOT DETECTED NOT DETECTED Final   Plesimonas shigelloides NOT DETECTED NOT DETECTED Final   Salmonella species NOT DETECTED NOT DETECTED Final   Yersinia enterocolitica NOT DETECTED NOT DETECTED Final   Vibrio species NOT DETECTED NOT DETECTED Final   Vibrio cholerae NOT DETECTED NOT DETECTED Final   Enteroaggregative E coli (EAEC) NOT DETECTED NOT DETECTED Final   Enteropathogenic E coli (EPEC) NOT DETECTED NOT DETECTED Final   Enterotoxigenic E coli (ETEC) NOT DETECTED NOT DETECTED Final   Shiga like toxin producing E coli (STEC) NOT DETECTED NOT DETECTED Final   Shigella/Enteroinvasive E coli (EIEC) NOT DETECTED NOT DETECTED Final   Cryptosporidium NOT DETECTED NOT DETECTED Final   Cyclospora cayetanensis NOT DETECTED NOT DETECTED Final   Entamoeba histolytica NOT DETECTED NOT DETECTED Final   Giardia lamblia NOT DETECTED NOT DETECTED Final   Adenovirus F40/41 NOT DETECTED NOT DETECTED Final   Astrovirus NOT DETECTED NOT DETECTED Final   Norovirus GI/GII NOT DETECTED NOT DETECTED Final   Rotavirus A NOT DETECTED NOT DETECTED Final   Sapovirus (I, II, IV, and V) NOT DETECTED NOT DETECTED Final    Comment: Performed at [facility], [address] [place], [place] [address]         Radiology Studies: Nm Gi Blood Loss  Result Date: 05/26/2018 CLINICAL DATA:  Bright red stools for several days EXAM: NUCLEAR MEDICINE GASTROINTESTINAL BLEEDING SCAN TECHNIQUE: Sequential abdominal images were obtained following intravenous administration of Tc-52m labeled red blood cells. RADIOPHARMACEUTICALS:  23.9 mCi Tc-1m pertechnetate in-vitro labeled red cells. COMPARISON:  CT from 05/24/2018 FINDINGS: Initial hour films show no significant  visualization of radiotracer within the bowel. Penile activity is noted in the midline as well as activity within the cardiac blood pool and liver and spleen. The second hour images demonstrate uptake within the bladder. There  is some mild evidence of radiotracer in the distal aspect of the transverse colon/proximal descending colon in an area of previous abnormality on CT. IMPRESSION: Acute GI hemorrhage in the distal transverse colon/proximal descending colon as described. These results will be called to the ordering clinician or representative by the Radiologist Assistant, and communication documented in the PACS or zVision Dashboard. Electronically Signed   By: Inez Catalina M.D.   On: 05/26/2018 18:51        Scheduled Meds: . dorzolamide-timolol  1 drop Left Eye Daily  . famotidine  20 mg Oral Daily  . fluticasone  2 spray Each Nare Daily  . hydrocortisone  25 mg Rectal BID  . latanoprost  1 drop Both Eyes QHS  . psyllium  1 packet Oral Daily  . tamsulosin  0.4 mg Oral Daily   Continuous Infusions: . ciprofloxacin Stopped (05/28/18 1047)  . metronidazole Stopped (05/28/18 0854)     LOS: 4 days    Time spent: 35 minutes    Irine Seal, MD [facility] Pager [phone]  If 7PM-7AM, please contact night-coverage [email or web address] Password [facility] 05/28/2018, 10:25 AM

## 2018-05-28 NOTE — Discharge Summary (Signed)
Physician Discharge Summary  Skanda Worlds XVQ:[id number] DOB: 06/07/1941 DOA: 05/24/2018  PCP: Marletta Lor, MD  Admit date: 05/24/2018 Discharge date: 05/28/2018  Time spent: 60 minutes  Recommendations for Outpatient Follow-up:  1. Follow-up with Dr. Watt Climes, gastroenterology in 2 to 3 weeks. 2. Follow-up with Marletta Lor, MD in 2 weeks.  On follow-up patient need a CBC done to follow-up on H&H.  Patient also need a basic metabolic profile done to follow-up on electrolytes and renal function.   Discharge Diagnoses:  Principal Problem:   GI bleeding Active Problems:   Diverticulitis   Acute blood loss anemia   Essential hypertension   BPH (benign prostatic hyperplasia)   GERD (gastroesophageal reflux disease)   GI bleed   Discharge Condition: Stable and improved  Diet recommendation: Heart healthy  Filed Weights   05/24/18 1111  Weight: 75.2 kg (165 lb 12.6 oz)    History of present illness:  Per Dr. Ronalee Red Sculley is a 77 y.o. male with medical history significant of BPH, hypertension, prior peptic ulcer diseases, GERD and constipation, presented to the [facility] complaining of bloody stools and  lower abdominal pain.  Symptoms started to 3 days prior to admission, after patient was constipated and took " baking soda" for his constipation.  Subsequently patient had loose stools with bright blood, which has persisted.  Patient denied nausea and vomiting.  Patient used to take Metamucil for constipation but has not been taking it lately and has been constipated more frequently.  Denied fever, chest pain and palpitations.  Other associated symptoms include lightheadedness.  ED Course: Hemodynamically stable, EDP rectal exam heme positive, hemoglobin 9.1, CT abdomen showed evidence of diverticulosis on the left side and evidence of diverticulitis.  Patient was started on Cipro and Flagyl.     Hospital Course:  1 GI bleed probably lower GI  bleed secondary to diverticular bleed Patient with presentation of bloody bowel movements.  CT abdomen and pelvis consistent with an acute diverticulitis.  Patient had some improvement initially, however stated had a bloody bowel movement the morning of 05/26/2018, after 2 normal bowel movements.  Hemoglobin currently at 10.5 discharge, from 10.0 from 6.8 from 7.8 from 6.9 from 9.7 on admission.   Last hemoglobin in November 2018 was 15.1.  Patient status post total of 3 units packed red blood cells during this hospitalization.  Patient was started on clear liquid and diet advanced to a soft diet which he tolerated.  Patient with no further bloody bowel movements over the past 24 hours.  Patient underwent a GI bleeding scan the evening of 05/26/2018 which showed acute GI hemorrhage in the distal transverse colon/proximal descending colon.  Patient was seen in consultation by IR however as patient's bloody bowel movements seem to have subsided and hemoglobin remained stable with no further bleeding.  Angiogram subsequently did canceled.  Patient was started on clear liquids diet advanced to a soft diet which he tolerated.  Patient maintained on IV antibiotics of ciprofloxacin and Flagyl will be discharged home on oral ciprofloxacin and Flagyl for 7 more days to complete a 10-day course of antibiotic treatment.  Follow-up with GI in 2 to 3 weeks.  Follow-up with PCP in 2 weeks.    2.  Acute blood loss anemia Secondary to problem #1.  Hemoglobin was 9.7 on admission and dropped as low as 6.9 on 05/25/2018.  Patient status post 1 unit transfusion of packed red blood cells on 05/25/2018.  Hemoglobin dropped to  6.8 on 05/26/2018.  Patient transfused 2 units packed red blood cells hemoglobin currently at 10.5 on day of discharge.  Patient denied any further bloody bowel movements this morning.  GI bleeding scan was done the evening of 05/26/2018 which showed acute GI hemorrhage in the distal transverse colon/proximal  descending colon.  Patient was seen in consultation by IR on 05/27/2018.  Patient noted to have no further bloody stools and hemoglobin remained stable and as such angiogram has been canceled.  Patient tolerating diet.  Outpatient follow-up with PCP.      3.  Acute diverticulitis Per CT abdomen and pelvis.  Patient had presented with lower abdominal discomfort and bloody bowel movements.  Patient was placed empirically on IV ciprofloxacin and IV Flagyl.  WBC trended down.  Patient remained afebrile.  Patient improved clinically throughout the hospitalization.  Patient was placed on a clear liquid diet and diet advanced to a soft diet.  Hemoglobin remained stable.  GI was consulted to follow the patient throughout the hospitalization.  Patient will be discharged home on oral ciprofloxacin and Flagyl for 7 more days to complete a 10-day course of antibiotic treatment.  Outpatient follow-up with GI in 2 to 3 weeks.   4.  BPH Maintained on Flomax throughout the hospitalization.    5.  Gastroesophageal reflux disease Patient with some complaints of intolerance to PPI yesterday and as such PPI was changed to Pepcid.  Patient will be discharged home on home regimen of omeprazole.  Outpatient follow-up.    6.  Hypertension Patient's Cozaar was held during the hospitalization and patient maintained on home regimen of Flomax.  Cozaar will be resumed on discharge.  7.  PVCs Asymptomatic.    Potassium was repleted and Greater than 4.  Magnesium was 1.8.       Procedures:  1 unit packed red blood cells 05/25/2018  CT abdomen pelvis 05/24/2018  2 units packed red blood cells 05/26/2018  Tagged red blood cell study 05/26/2018      Consultations:  Gastroenterology: Dr. Alessandra Bevels 05/25/2018  Interventional radiology: Dr. Vernard Gambles 05/27/2018      Discharge Exam: Vitals:   05/27/18 2041 05/28/18 0537  BP: 124/80 132/83  Pulse: 70 75  Resp: 15 16  Temp: 97.9 F (36.6 C) 97.9 F (36.6  C)  SpO2: 98% 97%    General: NAD Cardiovascular: RRR Respiratory: CTAB  Discharge Instructions   Discharge Instructions    Diet - low sodium heart healthy   Complete by:  As directed    Increase activity slowly   Complete by:  As directed      Allergies as of 05/28/2018      Reactions   Lisinopril Cough   Cough   Tramadol Hcl    REACTION: unspecified      Medication List    STOP taking these medications   clonazePAM 0.5 MG tablet Commonly known as:  KLONOPIN   ibuprofen 200 MG tablet Commonly known as:  ADVIL,MOTRIN   naproxen 500 MG tablet Commonly known as:  NAPROSYN     TAKE these medications   acetaminophen 325 MG tablet Commonly known as:  TYLENOL Take 2 tablets (650 mg total) by mouth every 6 (six) hours as needed for mild pain (or Fever >/= 101).   ciprofloxacin 500 MG tablet Commonly known as:  CIPRO Take 1 tablet (500 mg total) by mouth 2 (two) times daily for 7 days.   dorzolamide-timolol 22.3-6.8 MG/ML ophthalmic solution Commonly known as:  COSOPT Place 1  drop into the left eye daily.   fexofenadine 180 MG tablet Commonly known as:  ALLEGRA Take 1 tablet (180 mg total) by mouth daily.   fluticasone 50 MCG/ACT nasal spray Commonly known as:  FLONASE USE 2 SPRAYS IN EACH  NOSTRIL DAILY What changed:    how much to take  how to take this  when to take this   hydrocortisone 25 MG suppository Commonly known as:  ANUSOL-HC Place 1 suppository (25 mg total) rectally 2 (two) times daily.   latanoprost 0.005 % ophthalmic solution Commonly known as:  XALATAN Place 1 drop into both eyes at bedtime.   levocetirizine 5 MG tablet Commonly known as:  XYZAL TAKE 1 TABLET BY MOUTH  EVERY EVENING   losartan 100 MG tablet Commonly known as:  COZAAR TAKE 1 TABLET BY MOUTH  DAILY   metroNIDAZOLE 500 MG tablet Commonly known as:  FLAGYL Take 1 tablet (500 mg total) by mouth every 8 (eight) hours for 7 days.   omeprazole 20 MG  capsule Commonly known as:  PRILOSEC TAKE 1 CAPSULE BY MOUTH  DAILY   psyllium 95 % Pack Commonly known as:  HYDROCIL/METAMUCIL Take 1 packet by mouth daily. Start taking on:  05/29/2018   tadalafil 5 MG tablet Commonly known as:  CIALIS Take 1 tablet (5 mg total) by mouth daily as needed for erectile dysfunction.   tamsulosin 0.4 MG Caps capsule Commonly known as:  FLOMAX TAKE 1 CAPSULE BY MOUTH  DAILY   Testosterone 20.25 MG/ACT (1.62%) Gel Commonly known as:  ANDROGEL PUMP Place 3 puffs onto the skin every morning.      Allergies  Allergen Reactions  . Lisinopril Cough    Cough  . Tramadol Hcl     REACTION: unspecified   Follow-up Information    Clarene Essex, MD Follow up.   Specialty:  Gastroenterology Why:  f/u in 2-3 weeks. Contact information: [address] [place] [address] [phone]        Marletta Lor, MD. Schedule an appointment as soon as possible for a visit in 2 week(s).   Specialty:  Internal Medicine Contact information: [place] [address] [phone]            The results of significant diagnostics from this hospitalization (including imaging, microbiology, ancillary and laboratory) are listed below for reference.    Significant Diagnostic Studies: Nm Gi Blood Loss  Result Date: 05/26/2018 CLINICAL DATA:  Bright red stools for several days EXAM: NUCLEAR MEDICINE GASTROINTESTINAL BLEEDING SCAN TECHNIQUE: Sequential abdominal images were obtained following intravenous administration of Tc-77m labeled red blood cells. RADIOPHARMACEUTICALS:  23.9 mCi Tc-16m pertechnetate in-vitro labeled red cells. COMPARISON:  CT from 05/24/2018 FINDINGS: Initial hour films show no significant visualization of radiotracer within the bowel. Penile activity is noted in the midline as well as activity within the cardiac blood pool and liver and spleen. The second hour images demonstrate uptake within the  bladder. There is some mild evidence of radiotracer in the distal aspect of the transverse colon/proximal descending colon in an area of previous abnormality on CT. IMPRESSION: Acute GI hemorrhage in the distal transverse colon/proximal descending colon as described. These results will be called to the ordering clinician or representative by the Radiologist Assistant, and communication documented in the PACS or zVision Dashboard. Electronically Signed   By: Inez Catalina M.D.   On: 05/26/2018 18:51   Ct Abdomen Pelvis W Contrast  Result Date: 05/24/2018 CLINICAL DATA:  Bloody diarrhea for the past 3 days. Recent knee surgery. History of peptic ulcer disease. EXAM: CT ABDOMEN AND PELVIS WITH CONTRAST TECHNIQUE: Multidetector CT imaging of the abdomen and pelvis was performed using the standard protocol following bolus administration of intravenous contrast. CONTRAST:  181mL ISOVUE-300 IOPAMIDOL (ISOVUE-300) INJECTION 61% COMPARISON:  Abdominal ultrasound dated August 12, 2016. FINDINGS: Lower chest: No acute abnormality. Hepatobiliary: No focal liver abnormality is seen. No gallstones, gallbladder wall thickening, or biliary dilatation. Pancreas: Unremarkable. No pancreatic ductal dilatation or surrounding inflammatory changes. Spleen: Normal in size without focal abnormality. Adrenals/Urinary Tract: Adrenal glands are unremarkable. Kidneys are normal, without renal calculi, focal lesion, or hydronephrosis. Bladder is unremarkable. Stomach/Bowel: The stomach and small bowel are unremarkable. Extensive left-sided colonic diverticulosis with focal wall thickening and inflammatory stranding of the mid descending colon. There is also low-grade inflammatory stranding adjacent to the proximal sigmoid colon. Vascular/Lymphatic: Aortic atherosclerosis. Retroaortic left renal vein. No enlarged abdominal or pelvic lymph nodes. Reproductive: Mild prostatomegaly with coarse central calcifications. Other: No free fluid or  pneumoperitoneum. No focal fluid collection. Musculoskeletal: No acute or significant osseous findings. Chronic bilateral L5 pars defects. No spondylolisthesis. IMPRESSION: 1. Extensive left-sided colonic diverticulosis with focal inflammation of the mid ascending colon and proximal sigmoid colon, consistent with multifocal acute diverticulitis. No perforation or abscess. 2. Aortic atherosclerosis (ICD10-I70.0). 3. 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 Electronically Signed   By: Titus Dubin M.D.   On: 05/24/2018 13:35    Microbiology: Recent Results (from the past 240 hour(s))  Gastrointestinal Panel by PCR , Stool     Status: None   Collection Time: 05/24/18  6:13 PM  Result Value Ref Range Status   Campylobacter species NOT DETECTED NOT DETECTED Final   Plesimonas shigelloides NOT DETECTED NOT DETECTED Final   Salmonella species NOT DETECTED NOT DETECTED Final   Yersinia enterocolitica NOT DETECTED NOT DETECTED Final   Vibrio species NOT DETECTED NOT DETECTED Final   Vibrio cholerae NOT DETECTED NOT DETECTED Final   Enteroaggregative E coli (EAEC) NOT DETECTED NOT DETECTED Final   Enteropathogenic E coli (EPEC) NOT DETECTED NOT DETECTED Final   Enterotoxigenic E coli (ETEC) NOT DETECTED NOT DETECTED Final   Shiga like toxin producing E coli (STEC) NOT DETECTED NOT DETECTED Final   Shigella/Enteroinvasive E coli (EIEC) NOT DETECTED NOT DETECTED Final   Cryptosporidium NOT DETECTED NOT DETECTED Final   Cyclospora cayetanensis NOT DETECTED NOT DETECTED Final   Entamoeba histolytica NOT DETECTED NOT DETECTED Final   Giardia lamblia NOT DETECTED NOT DETECTED Final   Adenovirus F40/41 NOT DETECTED NOT DETECTED Final   Astrovirus NOT DETECTED NOT DETECTED Final   Norovirus GI/GII NOT DETECTED NOT DETECTED Final   Rotavirus A NOT DETECTED NOT DETECTED Final   Sapovirus (I, II, IV, and V) NOT DETECTED NOT DETECTED Final    Comment: Performed at [facility],  [address] [place], [place] [address]     Labs: Basic Metabolic Panel: Recent Labs  Lab 05/24/18 1149 05/25/18 0835 05/25/18 0845 05/26/18 0541 05/27/18 0513 05/28/18 0516  NA 135 136  --  141 139 139  K 3.7 3.7  --  4.0 4.0 4.3  CL 101 107  --  113* 107 107  CO2 26 24  --  25 25 26   GLUCOSE 111* 159*  --  113* 103* 116*  BUN 29* 16  --  10 9 8   CREATININE 0.84 0.84  --  0.81 0.83 1.04  CALCIUM 8.2* 7.7*  --  7.9* 8.4* 8.7*  MG  --   --  1.8 2.0  --  1.8   Liver Function Tests: Recent Labs  Lab 05/24/18 1149  AST 17  ALT 12  ALKPHOS 49  BILITOT 0.3  PROT 6.3*  ALBUMIN 3.7   Recent Labs  Lab 05/24/18 1149  LIPASE 29   No results for input(s): AMMONIA in the last 168 hours. CBC: Recent Labs  Lab 05/24/18 1149  05/25/18 1405  05/26/18 0541 05/26/18 1044 05/27/18 0513 05/27/18 1504 05/28/18 0516  WBC 12.1*   < > 8.8  --  6.1 6.2 7.1  --  8.8  NEUTROABS 9.0*  --   --   --   --   --   --   --   --   HGB 9.7*   < > 8.0*   < > 7.2* 6.8* 10.0* 10.2* 10.5*  HCT 27.6*   < > 23.2*   < > 20.1* 19.4* 28.3* 29.0* 29.4*  MCV 90.2   < > 89.2  --  88.9 89.4 88.2  --  88.8  PLT 260   < > 218  --  195 202 234  --  275   < > = values in this interval not displayed.   Cardiac Enzymes: No results for input(s): CKTOTAL, CKMB, CKMBINDEX, TROPONINI in the last 168 hours. BNP: BNP (last 3 results) No results for input(s): BNP in the last 8760 hours.  ProBNP (last 3 results) No results for input(s): PROBNP in the last 8760 hours.  CBG: No results for input(s): GLUCAP in the last 168 hours.     Signed:  Irine Seal MD.  [facility] 05/28/2018, 11:14 AM

## 2018-05-28 NOTE — Progress Notes (Signed)
Pt had a report from Telemetry that he had 6 run Vt and multifocal PVC's.Notified MD via [facility].

## 2018-05-28 NOTE — Progress Notes (Signed)
[facility] Progress Note  Subjective: Patient is doing well.  He would like to go home.  He has no further signs of any GI bleeding.  He has no complaints of abdominal pain.  He has responded well to IV antibiotics.  He tolerated diet today.  He is being switched over to oral antibiotics.  Objective: Vital signs in last 24 hours: Temp:  [97.9 F (36.6 C)-98.6 F (37 C)] 97.9 F (36.6 C) (06/30 0537) Pulse Rate:  [70-75] 75 (06/30 0537) Resp:  [15-18] 16 (06/30 0537) BP: (124-132)/(63-83) 132/83 (06/30 0537) SpO2:  [97 %-99 %] 97 % (06/30 0537) Weight change:    PE:  No distress  Alert and oriented  Heart regular rhythm no murmurs  Abdomen soft nontender  Lab Results: Results for orders placed or performed during the hospital encounter of 05/24/18 (from the past 24 hour(s))  Hemoglobin and hematocrit, blood     Status: Abnormal   Collection Time: 05/27/18  3:04 PM  Result Value Ref Range   Hemoglobin 10.2 (L) 13.0 - 17.0 g/dL   HCT 29.0 (L) 39.0 - 52.0 %  CBC     Status: Abnormal   Collection Time: 05/28/18  5:16 AM  Result Value Ref Range   WBC 8.8 4.0 - 10.5 K/uL   RBC 3.31 (L) 4.22 - 5.81 MIL/uL   Hemoglobin 10.5 (L) 13.0 - 17.0 g/dL   HCT 29.4 (L) 39.0 - 52.0 %   MCV 88.8 78.0 - 100.0 fL   MCH 31.7 26.0 - 34.0 pg   MCHC 35.7 30.0 - 36.0 g/dL   RDW 13.6 11.5 - 15.5 %   Platelets 275 150 - 400 K/uL  Basic metabolic panel     Status: Abnormal   Collection Time: 05/28/18  5:16 AM  Result Value Ref Range   Sodium 139 135 - 145 mmol/L   Potassium 4.3 3.5 - 5.1 mmol/L   Chloride 107 98 - 111 mmol/L   CO2 26 22 - 32 mmol/L   Glucose, Bld 116 (H) 70 - 99 mg/dL   BUN 8 8 - 23 mg/dL   Creatinine, Ser 1.04 0.61 - 1.24 mg/dL   Calcium 8.7 (L) 8.9 - 10.3 mg/dL   GFR calc non Af Amer >60 >60 mL/min   GFR calc Af Amer >60 >60 mL/min   Anion gap 6 5 - 15  Magnesium     Status: None   Collection Time: 05/28/18  5:16 AM  Result Value Ref Range   Magnesium  1.8 1.7 - 2.4 mg/dL    Studies/Results: No results found.    Assessment:   Diverticulitis, clinically better.  On antibiotics.  Lower GI bleed most likely diverticular in origin.  No further bleeding at this time.  Appears to have stabilized.  Plan:   Since he is eating, no longer bleeding, and has no abdominal pain, I think he can go home.  He can follow-up with his primary GI doctor in a couple of weeks.Magod     SAM F GANEM 05/28/2018, 11:11 AM  Pager: [phone] If no answer or after 5 PM call [phone]

## 2018-05-28 NOTE — Progress Notes (Signed)
Brief follow-up today re: GI bleed.  HgB remains stable at 10.5.  Brown stool this AM per RN, no further dark or bloody stools. Diet advanced by primary team to soft diet.  IR remains available if further GI bleeding occurs.   Brynda Greathouse, MS RD PA-C

## 2018-05-30 ENCOUNTER — Telehealth: Payer: Self-pay | Admitting: *Deleted

## 2018-05-30 NOTE — Telephone Encounter (Signed)
Transition Care Management Follow-up Telephone Call  Per Discharge Summary:  Admit date: 05/24/2018 Discharge date: 05/28/2018  Recommendations for Outpatient Follow-up:  1. Follow-up with Dr. Watt Climes, gastroenterology in 2 to 3 weeks. 2. Follow-up with Marletta Lor, MD in 2 weeks.  On follow-up patient need a CBC done to follow-up on H&H.  Patient also need a basic metabolic profile done to follow-up on electrolytes and renal function.   Discharge Diagnoses:  Principal Problem:   GI bleeding Active Problems:   Diverticulitis   Acute blood loss anemia   Essential hypertension   BPH (benign prostatic hyperplasia)   GERD (gastroesophageal reflux disease)   GI bleed   Discharge Condition: Stable and improved  Diet recommendation: Heart healthy  --   How have you been since you were released from the hospital? "I'm a little bit dizzy once in a while, but I'm doing okay."   Do you understand why you were in the hospital? yes   Do you understand the discharge instructions? yes   Where were you discharged to? Home   Items Reviewed:  Medications reviewed: yes  Allergies reviewed: no  Dietary changes reviewed: yes  Referrals reviewed: yes   Functional Questionnaire:   Activities of Daily Living (ADLs):   He states they are independent in the following: ambulation, bathing and hygiene, feeding, continence, grooming, toileting and dressing States they require assistance with the following: none   Any transportation issues/concerns?: no   Any patient concerns? no   Confirmed importance and date/time of follow-up visits scheduled yes  Provider Appointment booked with Dr. Bluford Kaufmann 06/14/18 @ 8:15am. This is the soonest patient is available to come in as he is going on vacation next week.  Confirmed with patient if condition begins to worsen call PCP or go to the ER.  Patient was given the office number and encouraged to call back with  question or concerns.  : yes

## 2018-05-31 ENCOUNTER — Other Ambulatory Visit: Payer: Self-pay

## 2018-05-31 NOTE — Patient Outreach (Signed)
[name] Care Management  05/31/2018  David Goodman 07/06/1941 [id number]   EMMI- General Discharge RED ON EMMI ALERT Day # 1 Date: 05/31/18 Red Alert Reason:  Scheduled follow up? No  Outreach attempt # 1 Telephone call to patient.  Spoke with patient.  He asked the reason for the call.  Patient advised that CM was calling from [facility] of [name] and for follow up to on a call he received.  Patient states he did not receive a call and he does not want to talk and hung up.     Plan: RN CM will close case.    Jone Baseman, RN, MSN [facility] Care Management Care Management Coordinator Direct Line [phone] Toll Free: [phone]  Fax: [phone]

## 2018-06-14 ENCOUNTER — Encounter: Payer: Self-pay | Admitting: Internal Medicine

## 2018-06-14 ENCOUNTER — Ambulatory Visit (INDEPENDENT_AMBULATORY_CARE_PROVIDER_SITE_OTHER): Payer: Medicare Other | Admitting: Internal Medicine

## 2018-06-14 VITALS — BP 124/70 | HR 71 | Temp 97.8°F | Wt 170.4 lb

## 2018-06-14 DIAGNOSIS — D62 Acute posthemorrhagic anemia: Secondary | ICD-10-CM | POA: Diagnosis not present

## 2018-06-14 DIAGNOSIS — I10 Essential (primary) hypertension: Secondary | ICD-10-CM

## 2018-06-14 DIAGNOSIS — E349 Endocrine disorder, unspecified: Secondary | ICD-10-CM

## 2018-06-14 DIAGNOSIS — K5793 Diverticulitis of intestine, part unspecified, without perforation or abscess with bleeding: Secondary | ICD-10-CM

## 2018-06-14 LAB — CBC WITH DIFFERENTIAL/PLATELET
BASOS ABS: 0.1 10*3/uL (ref 0.0–0.1)
BASOS PCT: 0.8 % (ref 0.0–3.0)
EOS PCT: 10.7 % — AB (ref 0.0–5.0)
Eosinophils Absolute: 0.8 10*3/uL — ABNORMAL HIGH (ref 0.0–0.7)
HEMATOCRIT: 37.3 % — AB (ref 39.0–52.0)
Hemoglobin: 12.8 g/dL — ABNORMAL LOW (ref 13.0–17.0)
LYMPHS ABS: 1.3 10*3/uL (ref 0.7–4.0)
LYMPHS PCT: 17.9 % (ref 12.0–46.0)
MCHC: 34.4 g/dL (ref 30.0–36.0)
MCV: 90.9 fl (ref 78.0–100.0)
MONOS PCT: 5.9 % (ref 3.0–12.0)
Monocytes Absolute: 0.4 10*3/uL (ref 0.1–1.0)
NEUTROS ABS: 4.7 10*3/uL (ref 1.4–7.7)
NEUTROS PCT: 64.7 % (ref 43.0–77.0)
PLATELETS: 311 10*3/uL (ref 150.0–400.0)
RBC: 4.11 Mil/uL — ABNORMAL LOW (ref 4.22–5.81)
RDW: 14.3 % (ref 11.5–15.5)
WBC: 7.3 10*3/uL (ref 4.0–10.5)

## 2018-06-14 LAB — BASIC METABOLIC PANEL
BUN: 22 mg/dL (ref 6–23)
CALCIUM: 8.9 mg/dL (ref 8.4–10.5)
CO2: 28 mEq/L (ref 19–32)
Chloride: 103 mEq/L (ref 96–112)
Creatinine, Ser: 0.96 mg/dL (ref 0.40–1.50)
GFR: 80.79 mL/min (ref >60.00)
Glucose, Bld: 85 mg/dL (ref 70–99)
Potassium: 4.5 mEq/L (ref 3.5–5.1)
Sodium: 138 mEq/L (ref 135–145)

## 2018-06-14 MED ORDER — TESTOSTERONE 20.25 MG/ACT (1.62%) TD GEL: 3.0000 | Freq: Every morning | TRANSDERMAL | 5 refills | Status: DC

## 2018-06-14 NOTE — Patient Instructions (Signed)
GI follow-up with [facility] gastroenterology as scheduled  Limit your sodium (Salt) intake    It is important that you exercise regularly, at least 20 minutes 3 to 4 times per week.  If you develop chest pain or shortness of breath seek  medical attention.  Please check your blood pressure on a regular basis.  If it is consistently greater than 150/90, please make an office appointment.  Return in 4-6 months for follow-up with a new provider of your choice

## 2018-06-14 NOTE — Progress Notes (Signed)
Subjective:    [name], male    DOB: 1941/06/02, 77 y.o.   MRN: [id number]  HPI  77 year old patient who is seen today following a recent hospital discharge.  He was admitted for acute lower GI bleeding secondary to diverticular disease.  He was treated for acute diverticulitis and has completed antibiotic therapy he is scheduled for GI follow-up with [facility] gastroenterology soon.  He required multiple blood transfusions. Doing quite well.  He is back on Metamucil and having daily bowel movements.  He complains of some mild bloating but otherwise doing fine.  Hospital records reviewed  Past Medical History:  Diagnosis Date  . ALLERGIC RHINITIS 07/13/2007  . HYPERLIPIDEMIA 07/13/2007  . HYPERTENSION 07/13/2007  . 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 PEPTIC ULCER DISEASE 07/13/2007  . 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 Marital status: Married    Spouse name: Not on file  . Number of children: Not on file  . Years of education: Not on file  . Highest education level: Not on file  Occupational History  . Not on file  Social Needs  . Financial resource strain: Not on file  . Food insecurity:    Worry: Not on file    Inability: Not on file  . Transportation needs:    Medical: Not on file    Non-medical: Not on file  Tobacco Use  . Smoking status: Never Smoker  . Smokeless tobacco: Never Used  Substance and Sexual Activity  . Alcohol use: Yes    Comment: couple times a week - wine  . Drug use: No  . Sexual activity: Not on file  Lifestyle  . Physical activity:    Days per week: Not on file    Minutes per session: Not on file  . Stress: Not on file  Relationships  . Social connections:    Talks on phone: Not on file    Gets together: Not on file    Attends religious service: Not on file    Active member of club or organization: Not on file    Attends meetings of clubs or organizations: Not on file   Relationship status: Not on file  . Intimate partner violence:    Fear of current or ex partner: Not on file    Emotionally abused: Not on file    Physically abused: Not on file    Forced sexual activity: Not on file  Other Topics Concern  . Not on file  Social History Narrative  . Not on file    Past Surgical History:  Procedure Laterality Date  . ROTATOR CUFF REPAIR      History reviewed. No pertinent family history.  Allergies  Allergen Reactions  . Lisinopril Cough    Cough  . Tramadol Hcl     REACTION: unspecified    Current Outpatient Medications on File Prior to Visit  Medication Sig Dispense Refill  . acetaminophen (TYLENOL) 325 MG tablet Take 2 tablets (650 mg total) by mouth every 6 (six) hours as needed for mild pain (or Fever >/= 101).    . dorzolamide-timolol (COSOPT) 22.3-6.8 MG/ML ophthalmic solution Place 1 drop into the left eye daily.     . fexofenadine (ALLEGRA) 180 MG tablet Take 1 tablet (180 mg total) by mouth daily. 60 tablet 2  . fluticasone (FLONASE) 50 MCG/ACT nasal spray USE 2 SPRAYS IN EACH  NOSTRIL DAILY (Patient taking differently: USE 2 SPRAYS  IN EACH  NOSTRIL DAILY AS NEEDED FOR ALLERGIES) 16 g 6  . hydrocortisone (ANUSOL-HC) 25 MG suppository Place 1 suppository (25 mg total) rectally 2 (two) times daily. 24 suppository 0  . latanoprost (XALATAN) 0.005 % ophthalmic solution Place 1 drop into both eyes at bedtime. 7.5 mL 1  . levocetirizine (XYZAL) 5 MG tablet TAKE 1 TABLET BY MOUTH  EVERY EVENING 90 tablet 2  . losartan (COZAAR) 100 MG tablet TAKE 1 TABLET BY MOUTH  DAILY 90 tablet 3  . omeprazole (PRILOSEC) 20 MG capsule TAKE 1 CAPSULE BY MOUTH  DAILY 90 capsule 1  . psyllium (HYDROCIL/METAMUCIL) 95 % PACK Take 1 packet by mouth daily. 56 each   . tadalafil (CIALIS) 5 MG tablet Take 1 tablet (5 mg total) by mouth daily as needed for erectile dysfunction. 30 tablet 2   No current facility-administered medications on file prior to visit.      BP 124/70 (BP Location: Left Arm, Patient Position: Sitting, Cuff Size: Normal)   Pulse 71   Temp 97.8 F (36.6 C) (Oral)   Wt 170 lb 6.4 oz (77.3 kg)   SpO2 97%   BMI 28.80 kg/m     Review of Systems  Constitutional: Negative for appetite change, chills, fatigue and fever.  HENT: Negative for congestion, dental problem, ear pain, hearing loss, sore throat, tinnitus, trouble swallowing and voice change.   Eyes: Negative for pain, discharge and visual disturbance.  Respiratory: Negative for cough, chest tightness, wheezing and stridor.   Cardiovascular: Negative for chest pain, palpitations and leg swelling.  Gastrointestinal: Positive for anal bleeding. Negative for abdominal distention, abdominal pain, blood in stool, constipation, diarrhea, nausea and vomiting.  Genitourinary: Negative for difficulty urinating, discharge, flank pain, genital sores, hematuria and urgency.  Musculoskeletal: Negative for arthralgias, back pain, gait problem, joint swelling, myalgias and neck stiffness.  Skin: Negative for rash.  Neurological: Negative for dizziness, syncope, speech difficulty, weakness, numbness and headaches.  Hematological: Negative for adenopathy. Does not bruise/bleed easily.  Psychiatric/Behavioral: Negative for behavioral problems and dysphoric mood. The patient is not nervous/anxious.        Objective:   Physical Exam  Constitutional: He appears well-developed and well-nourished.  HENT:  Head: Normocephalic and atraumatic.  Right Ear: External ear normal.  Left Ear: External ear normal.  Nose: Nose normal.  Mouth/Throat: Oropharynx is clear and moist.  Eyes: Pupils are equal, round, and reactive to light. Conjunctivae and EOM are normal. No scleral icterus.  Neck: Normal range of motion. Neck supple. No JVD present. No thyromegaly present.  Cardiovascular: Regular rhythm, normal heart sounds and intact distal pulses. Exam reveals no gallop and no friction rub.  No  murmur heard. Pulmonary/Chest: Effort normal and breath sounds normal. He exhibits no tenderness.  Abdominal: Soft. Bowel sounds are normal. He exhibits no distension and no mass. There is no tenderness. There is no guarding.  Genitourinary: Prostate normal and penis normal.  Musculoskeletal: Normal range of motion. He exhibits no edema or tenderness.  Lymphadenopathy:    He has no cervical adenopathy.  Neurological: He is alert. He has normal reflexes. No cranial nerve deficit. Coordination normal.  Skin: Skin is warm and dry. No rash noted.  Psychiatric: He has a normal mood and affect. His behavior is normal.          Assessment & Plan:   History of lower GI bleeding.  Will review CBC Status post diverticulitis.  Antibiotic therapy completed remains afebrile and pain-free Testosterone deficiency.  Prescriptions refilled Essential hypertension stable  Patient will establish with a new provider in 4 to 6 months Return here as needed  Marletta Lor

## 2018-06-16 ENCOUNTER — Telehealth: Payer: Self-pay | Admitting: *Deleted

## 2018-06-16 NOTE — Telephone Encounter (Signed)
Copied from [place] [phone]. Topic: Quick Communication - Lab Results >> Jun 16, 2018 11:05 AM David Goodman wrote: Pt called to get the lab results when ready, contact pt to advise

## 2018-06-16 NOTE — Telephone Encounter (Signed)
Please advise. Labs appear overall improved from 2 weeks ago, but patient still has some mild anemia. Any additional feedback? Thanks!

## 2018-06-20 DIAGNOSIS — M1712 Unilateral primary osteoarthritis, left knee: Secondary | ICD-10-CM | POA: Diagnosis not present

## 2018-06-20 DIAGNOSIS — M1711 Unilateral primary osteoarthritis, right knee: Secondary | ICD-10-CM | POA: Diagnosis not present

## 2018-06-27 NOTE — Telephone Encounter (Signed)
Patient's hemoglobin is now near normal following recent hospital admission for diverticular bleeding.  Ask patient to continue iron therapy for 1 month. Follow-up in 3 months as suggested Report any further lower GI bleeding

## 2018-06-27 NOTE — Telephone Encounter (Signed)
Copied from [place] [id number]. Topic: General - Other >> Jun 27, 2018 12:04 PM Yvette Rack wrote: Reason for CRM: Pt called in for lab results. Pt request call back. Cb# [phone]

## 2018-06-27 NOTE — Telephone Encounter (Signed)
Yes , please ask patient to take a supplement iron for 1 month

## 2018-06-27 NOTE — Telephone Encounter (Signed)
Pt notified of results/instructions and verbalized understanding.

## 2018-06-27 NOTE — Telephone Encounter (Signed)
Pt notified of results/instructions and verbalized understanding. He is not currently taking any iron and was not discharged w/ supplement. Should he just take OTC 65 mg iron supplement daily?

## 2018-07-25 DIAGNOSIS — D500 Iron deficiency anemia secondary to blood loss (chronic): Secondary | ICD-10-CM | POA: Diagnosis not present

## 2018-07-25 DIAGNOSIS — K317 Polyp of stomach and duodenum: Secondary | ICD-10-CM | POA: Diagnosis not present

## 2018-07-25 DIAGNOSIS — Z86010 Personal history of colonic polyps: Secondary | ICD-10-CM | POA: Diagnosis not present

## 2018-07-25 DIAGNOSIS — R5382 Chronic fatigue, unspecified: Secondary | ICD-10-CM | POA: Diagnosis not present

## 2018-07-27 DIAGNOSIS — M1712 Unilateral primary osteoarthritis, left knee: Secondary | ICD-10-CM | POA: Diagnosis not present

## 2018-07-27 DIAGNOSIS — M1711 Unilateral primary osteoarthritis, right knee: Secondary | ICD-10-CM | POA: Diagnosis not present

## 2018-08-10 ENCOUNTER — Telehealth: Payer: Self-pay | Admitting: Internal Medicine

## 2018-08-10 NOTE — Telephone Encounter (Signed)
Copied from [place] [phone]. Topic: Quick Communication - Rx Refill/Question >> Aug 10, 2018 10:47 AM Marval Regal L wrote: Pt called and stated that he would Lactulose 10 called in to [facility]. Pt has not been prescribed medication but was suggested by his cardiologist. Pt states this would be for his constipation. Please advise

## 2018-08-11 NOTE — Telephone Encounter (Signed)
Pt was notified that Dr.Kwiatkowski is out of the office. Pt was also advise that the Rx is not on his med list. I advise pt to try the OTC products for relief. Pt claimed that those do not work and they "blow him up" I informed pt that he has to drink a lot of water with these products. Pt stated that he does and that he will call his [facility] specialist for the Rx.

## 2018-08-11 NOTE — Telephone Encounter (Signed)
Message routed to PCP.

## 2018-08-11 NOTE — Telephone Encounter (Signed)
Called pt and was told pt was busy. Will try again later.

## 2018-09-14 ENCOUNTER — Encounter: Payer: Self-pay | Admitting: Internal Medicine

## 2018-09-14 DIAGNOSIS — D122 Benign neoplasm of ascending colon: Secondary | ICD-10-CM | POA: Diagnosis not present

## 2018-09-14 DIAGNOSIS — K5730 Diverticulosis of large intestine without perforation or abscess without bleeding: Secondary | ICD-10-CM | POA: Diagnosis not present

## 2018-09-14 DIAGNOSIS — D120 Benign neoplasm of cecum: Secondary | ICD-10-CM | POA: Diagnosis not present

## 2018-09-14 DIAGNOSIS — K2930 Chronic superficial gastritis without bleeding: Secondary | ICD-10-CM | POA: Diagnosis not present

## 2018-09-14 DIAGNOSIS — Z86010 Personal history of colonic polyps: Secondary | ICD-10-CM | POA: Diagnosis not present

## 2018-09-14 DIAGNOSIS — K317 Polyp of stomach and duodenum: Secondary | ICD-10-CM | POA: Diagnosis not present

## 2018-09-19 DIAGNOSIS — K2930 Chronic superficial gastritis without bleeding: Secondary | ICD-10-CM | POA: Diagnosis not present

## 2018-09-19 DIAGNOSIS — D120 Benign neoplasm of cecum: Secondary | ICD-10-CM | POA: Diagnosis not present

## 2018-09-19 DIAGNOSIS — D122 Benign neoplasm of ascending colon: Secondary | ICD-10-CM | POA: Diagnosis not present

## 2018-09-19 DIAGNOSIS — K317 Polyp of stomach and duodenum: Secondary | ICD-10-CM | POA: Diagnosis not present

## 2018-09-28 ENCOUNTER — Other Ambulatory Visit: Payer: Self-pay | Admitting: Internal Medicine

## 2018-09-28 ENCOUNTER — Telehealth: Payer: Self-pay | Admitting: *Deleted

## 2018-09-28 NOTE — Telephone Encounter (Signed)
Copied from [place] [phone]. Topic: Appointment Scheduling - Scheduling Inquiry for Clinic >> Sep 28, 2018  9:15 AM Sheran Luz wrote: Pt called to request a CPE from Dr. Jerilee Hoh, advised him he would need to complete a transfer of care first. Pt states that the practice should already know everything about him and does not understand why a TOC visit is necessary. Pt would like to know if he can do both at same time as soon as possible. Please advise.

## 2018-09-29 NOTE — Telephone Encounter (Signed)
Called Pt and spoke to him about why we do not perform CPEs at New Patient appointments. He voiced understanding and we scheduled an appointment for Dec 3rd.

## 2018-10-24 DIAGNOSIS — H401111 Primary open-angle glaucoma, right eye, mild stage: Secondary | ICD-10-CM | POA: Diagnosis not present

## 2018-10-24 DIAGNOSIS — H401122 Primary open-angle glaucoma, left eye, moderate stage: Secondary | ICD-10-CM | POA: Diagnosis not present

## 2018-10-24 DIAGNOSIS — H25013 Cortical age-related cataract, bilateral: Secondary | ICD-10-CM | POA: Diagnosis not present

## 2018-10-24 DIAGNOSIS — H2513 Age-related nuclear cataract, bilateral: Secondary | ICD-10-CM | POA: Diagnosis not present

## 2018-10-31 ENCOUNTER — Encounter: Payer: Self-pay | Admitting: Internal Medicine

## 2018-10-31 ENCOUNTER — Ambulatory Visit (INDEPENDENT_AMBULATORY_CARE_PROVIDER_SITE_OTHER): Payer: Medicare Other | Admitting: Internal Medicine

## 2018-10-31 ENCOUNTER — Other Ambulatory Visit: Payer: Self-pay

## 2018-10-31 VITALS — BP 120/70 | HR 68 | Temp 97.7°F | Ht 62.5 in | Wt 179.6 lb

## 2018-10-31 DIAGNOSIS — E785 Hyperlipidemia, unspecified: Secondary | ICD-10-CM

## 2018-10-31 DIAGNOSIS — E349 Endocrine disorder, unspecified: Secondary | ICD-10-CM

## 2018-10-31 DIAGNOSIS — R7302 Impaired glucose tolerance (oral): Secondary | ICD-10-CM | POA: Diagnosis not present

## 2018-10-31 DIAGNOSIS — K5792 Diverticulitis of intestine, part unspecified, without perforation or abscess without bleeding: Secondary | ICD-10-CM

## 2018-10-31 DIAGNOSIS — I10 Essential (primary) hypertension: Secondary | ICD-10-CM

## 2018-10-31 DIAGNOSIS — M170 Bilateral primary osteoarthritis of knee: Secondary | ICD-10-CM

## 2018-10-31 LAB — CBC WITH DIFFERENTIAL/PLATELET
BASOS PCT: 0.6 % (ref 0.0–3.0)
Basophils Absolute: 0.1 10*3/uL (ref 0.0–0.1)
EOS PCT: 12.7 % — AB (ref 0.0–5.0)
Eosinophils Absolute: 1.1 10*3/uL — ABNORMAL HIGH (ref 0.0–0.7)
HCT: 41.5 % (ref 39.0–52.0)
Hemoglobin: 14.4 g/dL (ref 13.0–17.0)
LYMPHS ABS: 1.4 10*3/uL (ref 0.7–4.0)
Lymphocytes Relative: 17.4 % (ref 12.0–46.0)
MCHC: 34.7 g/dL (ref 30.0–36.0)
MCV: 88.9 fl (ref 78.0–100.0)
MONO ABS: 0.5 10*3/uL (ref 0.1–1.0)
MONOS PCT: 5.5 % (ref 3.0–12.0)
NEUTROS PCT: 63.8 % (ref 43.0–77.0)
Neutro Abs: 5.3 10*3/uL (ref 1.4–7.7)
Platelets: 242 10*3/uL (ref 150.0–400.0)
RBC: 4.67 Mil/uL (ref 4.22–5.81)
RDW: 14.5 % (ref 11.5–15.5)
WBC: 8.3 10*3/uL (ref 4.0–10.5)

## 2018-10-31 LAB — LIPID PANEL
Cholesterol: 174 mg/dL (ref 0–200)
HDL: 37.3 mg/dL — AB (ref >39.00)
LDL Cholesterol: 99 mg/dL (ref 0–99)
NONHDL: 137.14
Total CHOL/HDL Ratio: 5
Triglycerides: 193 mg/dL — ABNORMAL HIGH (ref 0.0–149.0)
VLDL: 38.6 mg/dL (ref 0.0–40.0)

## 2018-10-31 LAB — COMPREHENSIVE METABOLIC PANEL
ALT: 20 U/L (ref 0–53)
AST: 18 U/L (ref 0–37)
Albumin: 4.3 g/dL (ref 3.5–5.2)
Alkaline Phosphatase: 51 U/L (ref 39–117)
BUN: 27 mg/dL — AB (ref 6–23)
CHLORIDE: 103 meq/L (ref 96–112)
CO2: 27 meq/L (ref 19–32)
Calcium: 8.8 mg/dL (ref 8.4–10.5)
Creatinine, Ser: 0.96 mg/dL (ref 0.40–1.50)
GFR: 80.71 mL/min (ref >60.00)
GLUCOSE: 106 mg/dL — AB (ref 70–99)
POTASSIUM: 4.4 meq/L (ref 3.5–5.1)
SODIUM: 138 meq/L (ref 135–145)
TOTAL PROTEIN: 6.4 g/dL (ref 6.0–8.3)
Total Bilirubin: 0.5 mg/dL (ref 0.2–1.2)

## 2018-10-31 LAB — VITAMIN D 25 HYDROXY (VIT D DEFICIENCY, FRACTURES): VITD: 33.23 ng/mL (ref 30.00–100.00)

## 2018-10-31 LAB — TSH: TSH: 2.93 u[IU]/mL (ref 0.35–4.50)

## 2018-10-31 LAB — VITAMIN B12: Vitamin B-12: 1197 pg/mL — ABNORMAL HIGH (ref 211–911)

## 2018-10-31 NOTE — Patient Instructions (Signed)
-  It was a pleasure meeting you today!  -Please schedule your annual physical with our wellness Coach as soon as possible.  -Please see me in 6 months for follow-up or sooner as needed.

## 2018-10-31 NOTE — Addendum Note (Signed)
Addended by: Wyvonne Lenz on: 10/31/2018 08:19 AM   Modules accepted: Orders

## 2018-10-31 NOTE — Progress Notes (Signed)
Established Patient Office Visit     CC/Reason for Visit: Establish care, follow-up chronic medical conditions  HPI: David Goodman is a 77 y.o. male who is coming in today for the above mentioned reasons.  Past due for annual physical November/2019.  Past Medical History is significant for: Hypertension, hyperlipidemia, testosterone deficiency on supplementation.  He has been doing quite well since last seen at the office.  He was hospitalized in June for diverticulitis with bleeding and has had no issues since, follows with GI as scheduled.  He takes testosterone as a patch for his testosterone deficiency, is on losartan for his hypertension, is not on medications for hyperlipidemia and manages this with diet and exercise.  He is very pleasant and has no acute complaints today.   Past Medical/Surgical History: Past Medical History:  Diagnosis Date  . ALLERGIC RHINITIS 07/13/2007  . HYPERLIPIDEMIA 07/13/2007  . HYPERTENSION 07/13/2007  . 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 PEPTIC ULCER DISEASE 07/13/2007  . 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 ROTATOR CUFF REPAIR      Social History:  reports that he has never smoked. He has never used smokeless tobacco. He reports that he drinks alcohol. He reports that he does not use drugs.  Allergies: Allergies  Allergen Reactions  . Lisinopril Cough    Cough  . Tramadol Hcl     REACTION: unspecified    Family History:  History reviewed. No pertinent family history of hypertension, diabetes, stroke, heart disease.  His father lived to be 40.   Current Outpatient Medications:  .  acetaminophen (TYLENOL) 325 MG tablet, Take 2 tablets (650 mg total) by mouth every 6 (six) hours as needed for mild pain (or Fever >/= 101)., Disp: , Rfl:  .  dorzolamide-timolol (COSOPT) 22.3-6.8 MG/ML ophthalmic solution, Place 1 drop into the left eye daily. , Disp: , Rfl:  .  fexofenadine  (ALLEGRA) 180 MG tablet, Take 1 tablet (180 mg total) by mouth daily., Disp: 60 tablet, Rfl: 2 .  fluticasone (FLONASE) 50 MCG/ACT nasal spray, USE 2 SPRAYS IN EACH  NOSTRIL DAILY (Patient taking differently: USE 2 SPRAYS IN EACH  NOSTRIL DAILY AS NEEDED FOR ALLERGIES), Disp: 16 g, Rfl: 6 .  hydrocortisone (ANUSOL-HC) 25 MG suppository, Place 1 suppository (25 mg total) rectally 2 (two) times daily., Disp: 24 suppository, Rfl: 0 .  latanoprost (XALATAN) 0.005 % ophthalmic solution, Place 1 drop into both eyes at bedtime., Disp: 7.5 mL, Rfl: 1 .  levocetirizine (XYZAL) 5 MG tablet, TAKE 1 TABLET BY MOUTH  EVERY EVENING, Disp: 90 tablet, Rfl: 2 .  losartan (COZAAR) 100 MG tablet, TAKE 1 TABLET BY MOUTH  DAILY, Disp: 90 tablet, Rfl: 3 .  naproxen (NAPROSYN) 500 MG tablet, TAKE 1 TABLET BY MOUTH TWO  TIMES DAILY WITH MEAL(S), Disp: 180 tablet, Rfl: 0 .  omeprazole (PRILOSEC) 20 MG capsule, TAKE 1 CAPSULE BY MOUTH  DAILY, Disp: 90 capsule, Rfl: 1 .  psyllium (HYDROCIL/METAMUCIL) 95 % PACK, Take 1 packet by mouth daily., Disp: 56 each, Rfl:  .  tadalafil (CIALIS) 5 MG tablet, Take 1 tablet (5 mg total) by mouth daily as needed for erectile dysfunction., Disp: 30 tablet, Rfl: 2 .  Testosterone (ANDROGEL PUMP) 20.25 MG/ACT (1.62%) GEL, Place 3 puffs onto the skin every morning., Disp: 75 g, Rfl: 5  Review of Systems: Constitutional: Denies fever, chills, diaphoresis, appetite change and fatigue.  HEENT: Denies photophobia, eye  pain, redness, hearing loss, ear pain, congestion, sore throat, rhinorrhea, sneezing, mouth sores, trouble swallowing, neck pain, neck stiffness and tinnitus.   Respiratory: Denies SOB, DOE, cough, chest tightness,  and wheezing.   Cardiovascular: Denies chest pain, palpitations and leg swelling.  Gastrointestinal: Denies nausea, vomiting, abdominal pain, diarrhea, constipation, blood in stool and abdominal distention.  Genitourinary: Denies dysuria, urgency, frequency, hematuria,  flank pain and difficulty urinating.  Endocrine: Denies: hot or cold intolerance, sweats, changes in hair or nails, polyuria, polydipsia. Musculoskeletal: Denies myalgias, back pain, joint swelling, arthralgias and gait problem.  Skin: Denies pallor, rash and wound.  Neurological: Denies dizziness, seizures, syncope, weakness, light-headedness, numbness and headaches.  Hematological: Denies adenopathy. Easy bruising, personal or family bleeding history  Psychiatric/Behavioral: Denies suicidal ideation, mood changes, confusion, nervousness, sleep disturbance and agitation    Physical Exam: Vitals:   10/31/18 0707  BP: 120/70  Pulse: 68  Temp: 97.7 F (36.5 C)  TempSrc: Oral  SpO2: 97%  Weight: 179 lb 9.6 oz (81.5 kg)  Height: 5' 2.5" (1.588 m)    Body mass index is 32.33 kg/m.   Constitutional: NAD, calm, comfortable Eyes: PERRL, lids and conjunctivae normal ENMT: Mucous membranes are moist. Posterior pharynx clear of any exudate or lesions. Normal dentition.  Neck: normal, supple, no masses, no thyromegaly Respiratory: clear to auscultation bilaterally, no wheezing, no crackles. Normal respiratory effort. No accessory muscle use.  Cardiovascular: Regular rate and rhythm, no murmurs / rubs / gallops. No extremity edema. 2+ pedal pulses. No carotid bruits.  Abdomen: no tenderness, no masses palpated. No hepatosplenomegaly. Bowel sounds positive.  Musculoskeletal: no clubbing / cyanosis. No joint deformity upper and lower extremities. Good ROM, no contractures. Normal muscle tone.  Skin: no rashes, lesions, ulcers. No induration Neurologic: CN 2-12 grossly intact. Sensation intact, DTR normal. Strength 5/5 in all 4.  Psychiatric: Normal judgment and insight. Alert and oriented x 3. Normal mood.    Impression and Plan:  Dyslipidemia  -Since he is fasting and is due for annual physical, will do lipids today. -LDL was 116 when last checked in November 2018.  Essential  hypertension  -Well-controlled on losartan.  Impaired glucose tolerance -Fasting blood sugar today. -Enforced importance of healthy lifestyle modifications.  Testosterone deficiency -On testosterone supplementation, will check testosterone today.  Diverticulitis -Hospitalized in June 2019, no issues since, follows with GI.  Osteoarthritis of both knees, unspecified osteoarthritis type -Follows with Dr. Maureen Ralphs with orthopedics. -He had what appears to be arthroscopic surgery of the right, is due to get some injections of the left in the next 1 to 2 weeks.   Since he is due for his annual physical and is fasting today, will proceed with labs today.  He will schedule follow-up with our wellness coach for his annual physical ASAP.   Patient Instructions  -It was a pleasure meeting you today!  -Please schedule your annual physical with our wellness Coach as soon as possible.  -Please see me in 6 months for follow-up or sooner as needed.     Lelon Frohlich, MD [place] [facility]

## 2018-11-15 ENCOUNTER — Ambulatory Visit (INDEPENDENT_AMBULATORY_CARE_PROVIDER_SITE_OTHER): Payer: Medicare Other | Admitting: Internal Medicine

## 2018-11-15 VITALS — BP 136/84 | HR 78 | Temp 98.4°F | Ht 64.0 in | Wt 175.0 lb

## 2018-11-15 DIAGNOSIS — R55 Syncope and collapse: Secondary | ICD-10-CM | POA: Insufficient documentation

## 2018-11-15 DIAGNOSIS — E349 Endocrine disorder, unspecified: Secondary | ICD-10-CM | POA: Diagnosis not present

## 2018-11-15 DIAGNOSIS — Z0000 Encounter for general adult medical examination without abnormal findings: Secondary | ICD-10-CM

## 2018-11-15 DIAGNOSIS — Z0001 Encounter for general adult medical examination with abnormal findings: Secondary | ICD-10-CM

## 2018-11-15 NOTE — Patient Instructions (Signed)
-  It was nice seeing you today!  -Lab work today to check your testosterone level, will call you with results.  -Make sure you moderate your alcohol intake to prevent further injury.  -Your EKG was normal.  -I will plan to see you back in 6 months for regular follow-up.

## 2018-11-15 NOTE — Addendum Note (Signed)
Addended by: Elmer Picker on: 11/15/2018 09:13 AM   Modules accepted: Orders

## 2018-11-15 NOTE — Progress Notes (Signed)
Established Patient Office Visit     CC/Reason for Visit: Medicare annual wellness visit  HPI: David Goodman is a 77 y.o. male who is coming in today for the above mentioned reasons.  He was last seen in the office in November for transfer of care to me and his chronic issues were addressed at that time.  He is up up-to-date on immunizations and cancer screening.  He has routine eye and dental care.  He uses a hearing aid in his right ear. Ambulates without assistance, is independent in his ADLs.  Fall risk is low, he is up-to-date on depression screening.  As we are concluding the visit he mentions that he had a syncopal episode on Sunday.  He believes this is because he had too much to drink.  He states that Friday and Saturday evenings he was drinking whiskey.  Early Sunday morning he went to [employer] and bought some chicken wings and beer.  Next thing he knows he was waking up on the floor of his bedroom.  He denies any injuries from the fall.  He does not believe he was out for more than 5 or 10 minutes.  He has not had chest pain, shortness of breath, dizziness, lightheadedness, headache since the event.  He subsequently went on with his day without any issues.  He states this is a rare occurrence for him in that he drank so much, he has never syncopized from excess alcohol intake. He typically drinks 1-2 glasses of wine per night.   Past Medical/Surgical History: Past Medical History:  Diagnosis Date  . ALLERGIC RHINITIS 07/13/2007  . HYPERLIPIDEMIA 07/13/2007  . HYPERTENSION 07/13/2007  . 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 PEPTIC ULCER DISEASE 07/13/2007  . 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 ROTATOR CUFF REPAIR      Social History:  reports that he has never smoked. He has never used smokeless tobacco. He reports current alcohol use. He reports that he does not use drugs.  Allergies: Allergies  Allergen  Reactions  . Lisinopril Cough    Cough  . Tramadol Hcl     REACTION: unspecified    Family History:  No history of hypertension, diabetes, heart disease, stroke.  Current Outpatient Medications:  .  acetaminophen (TYLENOL) 325 MG tablet, Take 2 tablets (650 mg total) by mouth every 6 (six) hours as needed for mild pain (or Fever >/= 101)., Disp: , Rfl:  .  dorzolamide-timolol (COSOPT) 22.3-6.8 MG/ML ophthalmic solution, Place 1 drop into the left eye daily. , Disp: , Rfl:  .  fexofenadine (ALLEGRA) 180 MG tablet, Take 1 tablet (180 mg total) by mouth daily., Disp: 60 tablet, Rfl: 2 .  fluticasone (FLONASE) 50 MCG/ACT nasal spray, USE 2 SPRAYS IN EACH  NOSTRIL DAILY (Patient taking differently: USE 2 SPRAYS IN EACH  NOSTRIL DAILY AS NEEDED FOR ALLERGIES), Disp: 16 g, Rfl: 6 .  hydrocortisone (ANUSOL-HC) 25 MG suppository, Place 1 suppository (25 mg total) rectally 2 (two) times daily., Disp: 24 suppository, Rfl: 0 .  latanoprost (XALATAN) 0.005 % ophthalmic solution, Place 1 drop into both eyes at bedtime., Disp: 7.5 mL, Rfl: 1 .  levocetirizine (XYZAL) 5 MG tablet, TAKE 1 TABLET BY MOUTH  EVERY EVENING, Disp: 90 tablet, Rfl: 2 .  losartan (COZAAR) 100 MG tablet, TAKE 1 TABLET BY MOUTH  DAILY, Disp: 90 tablet, Rfl: 3 .  naproxen (NAPROSYN) 500 MG tablet, TAKE 1  TABLET BY MOUTH TWO  TIMES DAILY WITH MEAL(S), Disp: 180 tablet, Rfl: 0 .  omeprazole (PRILOSEC) 20 MG capsule, TAKE 1 CAPSULE BY MOUTH  DAILY, Disp: 90 capsule, Rfl: 1 .  psyllium (HYDROCIL/METAMUCIL) 95 % PACK, Take 1 packet by mouth daily., Disp: 56 each, Rfl:  .  tadalafil (CIALIS) 5 MG tablet, Take 1 tablet (5 mg total) by mouth daily as needed for erectile dysfunction., Disp: 30 tablet, Rfl: 2 .  Testosterone (ANDROGEL PUMP) 20.25 MG/ACT (1.62%) GEL, Place 3 puffs onto the skin every morning., Disp: 75 g, Rfl: 5  Review of Systems:  Constitutional: Denies fever, chills, diaphoresis, appetite change and fatigue.  HEENT: Denies  photophobia, eye pain, redness, hearing loss, ear pain, congestion, sore throat, rhinorrhea, sneezing, mouth sores, trouble swallowing, neck pain, neck stiffness and tinnitus.   Respiratory: Denies SOB, DOE, cough, chest tightness,  and wheezing.   Cardiovascular: Denies chest pain, palpitations and leg swelling.  Gastrointestinal: Denies nausea, vomiting, abdominal pain, diarrhea, constipation, blood in stool and abdominal distention.  Genitourinary: Denies dysuria, urgency, frequency, hematuria, flank pain and difficulty urinating.  Endocrine: Denies: hot or cold intolerance, sweats, changes in hair or nails, polyuria, polydipsia. Musculoskeletal: Denies myalgias, back pain, joint swelling, arthralgias and gait problem.  Skin: Denies pallor, rash and wound.  Neurological: Denies dizziness, seizures, syncope, weakness, light-headedness, numbness and headaches.  Hematological: Denies adenopathy. Easy bruising, personal or family bleeding history  Psychiatric/Behavioral: Denies suicidal ideation, mood changes, confusion, nervousness, sleep disturbance and agitation    Physical Exam: Vitals:   11/15/18 0815  BP: 136/84  Pulse: 78  Temp: 98.4 F (36.9 C)  TempSrc: Oral  SpO2: 95%  Weight: 175 lb (79.4 kg)  Height: 5\' 4"  (1.626 m)    Body mass index is 30.04 kg/m.   Constitutional: NAD, calm, comfortable Eyes: PERRL, lids and conjunctivae normal ENMT: Mucous membranes are moist. Posterior pharynx clear of any exudate or lesions. Normal dentition. Tympanic membrane is pearly white, no erythema or bulging. Neck: normal, supple, no masses, no thyromegaly Respiratory: clear to auscultation bilaterally, no wheezing, no crackles. Normal respiratory effort. No accessory muscle use.  Cardiovascular: Regular rate and rhythm, no murmurs / rubs / gallops. No extremity edema. 2+ pedal pulses. No carotid bruits.  Abdomen: no tenderness, no masses palpated. No hepatosplenomegaly. Bowel sounds  positive.  Musculoskeletal: no clubbing / cyanosis. No joint deformity upper and lower extremities. Good ROM, no contractures. Normal muscle tone.  Skin: no rashes, lesions, ulcers. No induration Neurologic: CN 2-12 grossly intact. Sensation intact, DTR normal. Strength 5/5 in all 4.  Psychiatric: Normal judgment and insight. Alert and oriented x 3. Normal mood.    Impression and Plan:  Syncope, unspecified syncope type  -No concerning features based on history or exam, he has not had weakness of one side of his body, vision disturbance, no injuries during the fall, no chest pain, dizziness or lightheadedness during or after the event. -Since he has never had an EKG we will do one today to document and look for any changes. EKG is normal as per my interpretation. -We will also perform an alcohol screening assessment today.  Encounter for Medicare annual wellness exam -Up-to-date on immunizations, cancer screening, gets routine eye and dental care. 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    Patient Instructions  -It was nice seeing you today!  -Lab work today to check your  testosterone level, will call you with results.  -Make sure you moderate your alcohol intake to prevent further injury.  -Your EKG was normal.  -I will plan to see you back in 6 months for regular follow-up.     Lelon Frohlich, MD [facility]

## 2018-11-16 LAB — TESTOSTERONE TOTAL,FREE,BIO, MALES
Albumin: 4.2 g/dL (ref 3.6–5.1)
SEX HORMONE BINDING: 39 nmol/L (ref 22–77)
Testosterone: 145 ng/dL — ABNORMAL LOW (ref 250–827)

## 2018-12-04 ENCOUNTER — Ambulatory Visit: Payer: Self-pay | Admitting: Internal Medicine

## 2018-12-04 NOTE — Telephone Encounter (Signed)
Pt stated he needs to speak with a nurse because he was advised that he needs to have a testosterone injection instead of the cream. Pt stated he would like to get the prescription for the testosterone injection as he has a nurse that can inject it. Pt requests call back. Cb# [phone] / Patient had OV 11/15/18 with labs .  Patient is now wanting testosterone injection instead of the cream / Will send this to the provider to see if patient needs an appointment or if the Dr. / Damaris Schooner with [facility] patient would need teaching on the nurse visit or he could do an OV / Patient is in agreement to picking medication up at pharmacy and coming in to see the nurse. / Nurse visit appointment made. / willl send to [facility] so that the injection can be called in.

## 2018-12-04 NOTE — Telephone Encounter (Signed)
[place]. Thanks!

## 2018-12-05 ENCOUNTER — Other Ambulatory Visit: Payer: Self-pay | Admitting: Internal Medicine

## 2018-12-05 DIAGNOSIS — E349 Endocrine disorder, unspecified: Secondary | ICD-10-CM

## 2018-12-05 MED ORDER — TESTOSTERONE CYPIONATE 100 MG/ML IJ SOLN: 100.0000 mg | INTRAMUSCULAR | 1 refills | Status: DC

## 2018-12-05 NOTE — Telephone Encounter (Signed)
Have sent in Rx for IM testosterone q 2 weeks. Have him stop the androgel.

## 2018-12-05 NOTE — Telephone Encounter (Signed)
Patient is aware 

## 2018-12-05 NOTE — Telephone Encounter (Signed)
If okay,  New Rx for testosterone injection please.

## 2018-12-06 ENCOUNTER — Ambulatory Visit: Payer: Self-pay

## 2018-12-08 ENCOUNTER — Ambulatory Visit (INDEPENDENT_AMBULATORY_CARE_PROVIDER_SITE_OTHER): Payer: Medicare Other | Admitting: *Deleted

## 2018-12-08 DIAGNOSIS — E349 Endocrine disorder, unspecified: Secondary | ICD-10-CM | POA: Diagnosis not present

## 2018-12-08 MED ORDER — TESTOSTERONE CYPIONATE 100 MG/ML IM SOLN
100.0000 mg | INTRAMUSCULAR | Status: DC
  Administered 2018-12-08 – 2019-05-14 (×2): 100 mg via INTRAMUSCULAR

## 2018-12-08 NOTE — Progress Notes (Signed)
Per orders of Dr. Jerilee Hoh, injection of  IM Testosterone 100mg  given by Nyoka Cowden, ASHTYN M. Patient tolerated injection well.

## 2018-12-14 DIAGNOSIS — M25561 Pain in right knee: Secondary | ICD-10-CM | POA: Diagnosis not present

## 2018-12-14 DIAGNOSIS — M1711 Unilateral primary osteoarthritis, right knee: Secondary | ICD-10-CM | POA: Diagnosis not present

## 2018-12-14 DIAGNOSIS — M170 Bilateral primary osteoarthritis of knee: Secondary | ICD-10-CM | POA: Diagnosis not present

## 2018-12-14 DIAGNOSIS — M1712 Unilateral primary osteoarthritis, left knee: Secondary | ICD-10-CM | POA: Diagnosis not present

## 2018-12-21 DIAGNOSIS — M1712 Unilateral primary osteoarthritis, left knee: Secondary | ICD-10-CM | POA: Diagnosis not present

## 2018-12-28 DIAGNOSIS — M1712 Unilateral primary osteoarthritis, left knee: Secondary | ICD-10-CM | POA: Diagnosis not present

## 2019-01-18 ENCOUNTER — Encounter: Payer: Self-pay | Admitting: Internal Medicine

## 2019-01-18 ENCOUNTER — Ambulatory Visit (INDEPENDENT_AMBULATORY_CARE_PROVIDER_SITE_OTHER): Payer: Medicare Other | Admitting: Internal Medicine

## 2019-01-18 VITALS — BP 150/70 | HR 84 | Temp 98.3°F | Wt 178.9 lb

## 2019-01-18 DIAGNOSIS — H9202 Otalgia, left ear: Secondary | ICD-10-CM | POA: Diagnosis not present

## 2019-01-18 DIAGNOSIS — F1020 Alcohol dependence, uncomplicated: Secondary | ICD-10-CM

## 2019-01-18 DIAGNOSIS — R42 Dizziness and giddiness: Secondary | ICD-10-CM

## 2019-01-18 DIAGNOSIS — R252 Cramp and spasm: Secondary | ICD-10-CM | POA: Diagnosis not present

## 2019-01-18 DIAGNOSIS — I10 Essential (primary) hypertension: Secondary | ICD-10-CM

## 2019-01-18 LAB — BASIC METABOLIC PANEL
BUN: 33 mg/dL — ABNORMAL HIGH (ref 6–23)
CALCIUM: 9.4 mg/dL (ref 8.4–10.5)
CO2: 27 mEq/L (ref 19–32)
Chloride: 100 mEq/L (ref 96–112)
Creatinine, Ser: 1.06 mg/dL (ref 0.40–1.50)
GFR: 67.69 mL/min (ref >60.00)
Glucose, Bld: 78 mg/dL (ref 70–99)
POTASSIUM: 4.4 meq/L (ref 3.5–5.1)
SODIUM: 137 meq/L (ref 135–145)

## 2019-01-18 LAB — MAGNESIUM: MAGNESIUM: 1.9 mg/dL (ref 1.5–2.5)

## 2019-01-18 NOTE — Patient Instructions (Signed)
-  Nice seeing you today!  -Lab work today. Will notify you as soon as results are available.  -Try and cut down on alcohol use.  -Continue taking your blood pressure at home 2-3 times a week and bring those values in to your next visit.  -Schedule follow up in 8 weeks for BP management.

## 2019-01-18 NOTE — Progress Notes (Signed)
Established Patient Office Visit     CC/Reason for Visit: leg cramps, dizziness, congestion and left ear pain.  HPI: David Goodman is a 78 y.o. male who is coming in today for the above mentioned reasons. Past Medical History is significant for: Hypertension, hyperlipidemia, testosterone deficiency on supplementation. On his last visit he admitted he drinks ETOH "a lot" and actually had a syncopal event at that time following a binge drinking episode. He comes in today with several acute complaints:  1. Main thing is leg cramps. Usually at night. A nurse friend of his told him his Mg might be low so he has been taking OTC Mag. The cramps wake him up at night.  2. He has been feeling dizzy thruout the day. Has not passed out since last visit in December.  3. He has noticed his BP has been high: in the 150-160 range at home. He states he has been taking his cozaar routinely.  4. He has congestion and left ear pain when he blows his nose. He doesn't "feel sick".   Past Medical/Surgical History: Past Medical History:  Diagnosis Date  . ALLERGIC RHINITIS 07/13/2007  . HYPERLIPIDEMIA 07/13/2007  . HYPERTENSION 07/13/2007  . 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 PEPTIC ULCER DISEASE 07/13/2007  . 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 ROTATOR CUFF REPAIR      Social History:  reports that he has never smoked. He has never used smokeless tobacco. He reports current alcohol use. He reports that he does not use drugs.  Allergies: Allergies  Allergen Reactions  . Lisinopril Cough    Cough  . Tramadol Hcl     REACTION: unspecified    Family History:  No h/i heart disease, stroke or cancer  Current Outpatient Medications:  .  dorzolamide-timolol (COSOPT) 22.3-6.8 MG/ML ophthalmic solution, Place 1 drop into the left eye daily. , Disp: , Rfl:  .  fluticasone (FLONASE) 50 MCG/ACT nasal spray, USE 2 SPRAYS IN EACH   NOSTRIL DAILY (Patient taking differently: USE 2 SPRAYS IN EACH  NOSTRIL DAILY AS NEEDED FOR ALLERGIES), Disp: 16 g, Rfl: 6 .  latanoprost (XALATAN) 0.005 % ophthalmic solution, Place 1 drop into both eyes at bedtime., Disp: 7.5 mL, Rfl: 1 .  losartan (COZAAR) 100 MG tablet, TAKE 1 TABLET BY MOUTH  DAILY, Disp: 90 tablet, Rfl: 3 .  naproxen (NAPROSYN) 500 MG tablet, TAKE 1 TABLET BY MOUTH TWO  TIMES DAILY WITH MEAL(S), Disp: 180 tablet, Rfl: 0 .  omeprazole (PRILOSEC) 20 MG capsule, TAKE 1 CAPSULE BY MOUTH  DAILY, Disp: 90 capsule, Rfl: 1 .  psyllium (HYDROCIL/METAMUCIL) 95 % PACK, Take 1 packet by mouth daily., Disp: 56 each, Rfl:  .  tadalafil (CIALIS) 5 MG tablet, Take 1 tablet (5 mg total) by mouth daily as needed for erectile dysfunction., Disp: 30 tablet, Rfl: 2 .  Testosterone Cypionate 100 MG/ML SOLN, Inject 100 mg as directed every 14 (fourteen) days., Disp: 1 vial, Rfl: 1 .  levocetirizine (XYZAL) 5 MG tablet, TAKE 1 TABLET BY MOUTH  EVERY EVENING (Patient not taking: Reported on 01/18/2019), Disp: 90 tablet, Rfl: 2  Current Facility-Administered Medications:  .  testosterone cypionate (DEPOTESTOTERONE CYPIONATE) injection 100 mg, 100 mg, Intramuscular, Q14 Days, Isaac Bliss, Rayford Halsted, MD, 100 mg at 12/08/18 4765  Review of Systems:  Constitutional: Denies fever, chills, diaphoresis, appetite change and fatigue.  HEENT: Denies photophobia, eye pain, redness, hearing loss, ,  sore throat, rhinorrhea, sneezing, mouth sores, trouble swallowing, neck pain, neck stiffness and tinnitus.   Respiratory: Denies SOB, DOE, cough, chest tightness,  and wheezing.   Cardiovascular: Denies chest pain, palpitations and leg swelling.  Gastrointestinal: Denies nausea, vomiting, abdominal pain, diarrhea, constipation, blood in stool and abdominal distention.  Genitourinary: Denies dysuria, urgency, frequency, hematuria, flank pain and difficulty urinating.  Endocrine: Denies: hot or cold intolerance,  sweats, changes in hair or nails, polyuria, polydipsia. Musculoskeletal: Denies myalgias, back pain, joint swelling, arthralgias and gait problem.  Skin: Denies pallor, rash and wound.  Neurological: Denies  seizures, syncope, weakness, light-headedness, numbness and headaches.  Hematological: Denies adenopathy. Easy bruising, personal or family bleeding history  Psychiatric/Behavioral: Denies suicidal ideation, mood changes, confusion, nervousness, sleep disturbance and agitation    Physical Exam: Vitals:   01/18/19 0720  BP: (!) 150/70  Pulse: 84  Temp: 98.3 F (36.8 C)  TempSrc: Oral  SpO2: 97%  Weight: 178 lb 14.4 oz (81.1 kg)    Body mass index is 30.71 kg/m.   Constitutional: NAD, calm, comfortable Eyes: PERRL, lids and conjunctivae normal ENMT: Mucous membranes are moist.  Tympanic membrane is pearly white, no erythema or bulging. Neck: normal, supple, no masses, no thyromegaly Respiratory: clear to auscultation bilaterally, no wheezing, no crackles. Normal respiratory effort. No accessory muscle use.  Cardiovascular: Regular rate and rhythm, no murmurs / rubs / gallops. No extremity edema. 2+ pedal pulses. No carotid bruits.  Musculoskeletal: no clubbing / cyanosis. No joint deformity upper and lower extremities. Good ROM, no contractures. Normal muscle tone.  Skin: no rashes, lesions, ulcers. No induration Neurologic: CN 2-12 grossly intact. Sensation intact, DTR normal. Strength 5/5 in all 4.  Psychiatric: Normal judgment and insight. Alert and oriented x 3. Normal mood.    Impression and Plan:  Leg cramps  -Given his continued ETOH use, suspect he may have some electrolyte deficiencies. -Will check BMET, Mg, Phos today.  Essential hypertension  -Given other acute issues going on, will give him a grace period as his BP has been normal before. -Will do ambulatory BP monitoring and will return in 8 weeks for BP follow up to see if we need to augment his  antihypertensive therapy.  Left ear pain -Likely a mild URI with congestion. No concerning findings on exam.  Dizziness -Likely due to elevated BP and ongoing ETOH use. -He will notify us if it gets worse or he passes out.  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 -He says he drinks ETOH only because he is bored and has nothing to do thruout the day. -We have discussed different coping mechanisms and he has been counseled on cessation.     Patient Instructions  -Nice seeing you today!  -Lab work today. Will notify you as soon as results are available.  -Try and cut down on alcohol use.  -Continue taking your blood pressure at home 2-3 times a week and bring those values in to your next visit.  -Schedule follow up in 8 weeks for BP management.       Lelon Frohlich, MD [facility]

## 2019-01-24 ENCOUNTER — Other Ambulatory Visit: Payer: Self-pay | Admitting: *Deleted

## 2019-01-24 MED ORDER — NAPROXEN 500 MG PO TABS: ORAL_TABLET | ORAL | 0 refills | Status: DC

## 2019-02-13 ENCOUNTER — Encounter (HOSPITAL_COMMUNITY): Payer: Self-pay

## 2019-02-13 ENCOUNTER — Inpatient Hospital Stay (HOSPITAL_COMMUNITY)
Admission: EM | Admit: 2019-02-13 | Discharge: 2019-02-18 | DRG: 378 | Disposition: A | Discharge status: Home / Self Care | Payer: Medicare Other | Attending: Internal Medicine | Admitting: Internal Medicine

## 2019-02-13 ENCOUNTER — Other Ambulatory Visit: Payer: Self-pay

## 2019-02-13 DIAGNOSIS — Z8711 Personal history of peptic ulcer disease: Secondary | ICD-10-CM

## 2019-02-13 DIAGNOSIS — K625 Hemorrhage of anus and rectum: Secondary | ICD-10-CM

## 2019-02-13 DIAGNOSIS — Z87442 Personal history of urinary calculi: Secondary | ICD-10-CM | POA: Diagnosis not present

## 2019-02-13 DIAGNOSIS — Z885 Allergy status to narcotic agent status: Secondary | ICD-10-CM | POA: Diagnosis not present

## 2019-02-13 DIAGNOSIS — K922 Gastrointestinal hemorrhage, unspecified: Secondary | ICD-10-CM | POA: Diagnosis present

## 2019-02-13 DIAGNOSIS — R58 Hemorrhage, not elsewhere classified: Secondary | ICD-10-CM

## 2019-02-13 DIAGNOSIS — Z79899 Other long term (current) drug therapy: Secondary | ICD-10-CM | POA: Diagnosis not present

## 2019-02-13 DIAGNOSIS — Z791 Long term (current) use of non-steroidal anti-inflammatories (NSAID): Secondary | ICD-10-CM

## 2019-02-13 DIAGNOSIS — K219 Gastro-esophageal reflux disease without esophagitis: Secondary | ICD-10-CM | POA: Diagnosis present

## 2019-02-13 DIAGNOSIS — Z888 Allergy status to other drugs, medicaments and biological substances status: Secondary | ICD-10-CM

## 2019-02-13 DIAGNOSIS — I10 Essential (primary) hypertension: Secondary | ICD-10-CM | POA: Diagnosis present

## 2019-02-13 DIAGNOSIS — Z86010 Personal history of colonic polyps: Secondary | ICD-10-CM

## 2019-02-13 DIAGNOSIS — D62 Acute posthemorrhagic anemia: Secondary | ICD-10-CM | POA: Diagnosis present

## 2019-02-13 DIAGNOSIS — E785 Hyperlipidemia, unspecified: Secondary | ICD-10-CM | POA: Diagnosis present

## 2019-02-13 DIAGNOSIS — K5731 Diverticulosis of large intestine without perforation or abscess with bleeding: Secondary | ICD-10-CM | POA: Diagnosis not present

## 2019-02-13 DIAGNOSIS — N400 Enlarged prostate without lower urinary tract symptoms: Secondary | ICD-10-CM | POA: Diagnosis present

## 2019-02-13 LAB — COMPREHENSIVE METABOLIC PANEL
ALT: 25 U/L (ref 0–44)
AST: 28 U/L (ref 15–41)
Albumin: 4 g/dL (ref 3.5–5.0)
Alkaline Phosphatase: 49 U/L (ref 38–126)
Anion gap: 9 (ref 5–15)
BUN: 30 mg/dL — ABNORMAL HIGH (ref 8–23)
CO2: 22 mmol/L (ref 22–32)
Calcium: 8.8 mg/dL — ABNORMAL LOW (ref 8.9–10.3)
Chloride: 108 mmol/L (ref 98–111)
Creatinine, Ser: 1.01 mg/dL (ref 0.61–1.24)
GFR calc Af Amer: >60 mL/min (ref >60)
GFR calc non Af Amer: >60 mL/min (ref >60)
Glucose, Bld: 111 mg/dL — ABNORMAL HIGH (ref 70–99)
Potassium: 3.8 mmol/L (ref 3.5–5.1)
Sodium: 139 mmol/L (ref 135–145)
Total Bilirubin: 0.6 mg/dL (ref 0.3–1.2)
Total Protein: 6.2 g/dL — ABNORMAL LOW (ref 6.5–8.1)

## 2019-02-13 LAB — CBC
HCT: 38 % — ABNORMAL LOW (ref 39.0–52.0)
HCT: 39 % (ref 39.0–52.0)
HEMOGLOBIN: 13.4 g/dL (ref 13.0–17.0)
Hemoglobin: 13.5 g/dL (ref 13.0–17.0)
MCH: 32.2 pg (ref 26.0–34.0)
MCH: 31.8 pg (ref 26.0–34.0)
MCHC: 35.3 g/dL (ref 30.0–36.0)
MCHC: 34.6 g/dL (ref 30.0–36.0)
MCV: 91.3 fL (ref 80.0–100.0)
MCV: 91.8 fL (ref 80.0–100.0)
NRBC: 0 % (ref 0.0–0.2)
Platelets: 234 10*3/uL (ref 150–400)
Platelets: 241 10*3/uL (ref 150–400)
RBC: 4.16 MIL/uL — ABNORMAL LOW (ref 4.22–5.81)
RBC: 4.25 MIL/uL (ref 4.22–5.81)
RDW: 13.4 % (ref 11.5–15.5)
RDW: 13.5 % (ref 11.5–15.5)
WBC: 9.1 10*3/uL (ref 4.0–10.5)
WBC: 10.3 10*3/uL (ref 4.0–10.5)
nRBC: 0 % (ref 0.0–0.2)

## 2019-02-13 MED ORDER — ACETAMINOPHEN 650 MG RE SUPP: 650.0000 mg | Freq: Four times a day (QID) | RECTAL | Status: DC | PRN

## 2019-02-13 MED ORDER — SODIUM CHLORIDE 0.9 % IV SOLN
INTRAVENOUS | Status: DC
  Administered 2019-02-13: 21:00:00 via INTRAVENOUS

## 2019-02-13 MED ORDER — ONDANSETRON HCL 4 MG PO TABS: 4.0000 mg | ORAL_TABLET | Freq: Four times a day (QID) | ORAL | Status: DC | PRN

## 2019-02-13 MED ORDER — LATANOPROST 0.005 % OP SOLN
1.0000 [drp] | Freq: Every day | OPHTHALMIC | Status: DC
  Administered 2019-02-13 – 2019-02-17 (×5): 1 [drp] via OPHTHALMIC
  Filled 2019-02-13: qty 2.5

## 2019-02-13 MED ORDER — ACETAMINOPHEN 325 MG PO TABS
650.0000 mg | ORAL_TABLET | Freq: Four times a day (QID) | ORAL | Status: DC | PRN
  Administered 2019-02-14 – 2019-02-17 (×4): 650 mg via ORAL
  Filled 2019-02-13 (×4): qty 2

## 2019-02-13 MED ORDER — ONDANSETRON HCL 4 MG/2ML IJ SOLN
4.0000 mg | Freq: Four times a day (QID) | INTRAMUSCULAR | Status: DC | PRN
  Administered 2019-02-16: 4 mg via INTRAVENOUS
  Filled 2019-02-13: qty 2

## 2019-02-13 MED ORDER — POLYVINYL ALCOHOL 1.4 % OP SOLN
1.0000 [drp] | OPHTHALMIC | Status: DC | PRN
  Administered 2019-02-13: 1 [drp] via OPHTHALMIC
  Filled 2019-02-13: qty 15

## 2019-02-13 NOTE — ED Triage Notes (Signed)
Patient arrived via POV. Patient is AOx4 and ambulatory. Patient chief complaint is rectal bleeding which began around 1430 today and feels bloated, patient had large bowel movement with dark red blood followed by bright red blood. Patient had 2nd bowel movement in bathroom in waiting area. Patient denies pain, SOB and chest pain.

## 2019-02-13 NOTE — ED Provider Notes (Signed)
[facility] DEPT Provider Note   CSN: [id number] Arrival date & time: 02/13/19  1721    History   Chief Complaint Chief Complaint  Patient presents with  . Rectal Bleeding    HPI David Goodman is a 78 y.o. male.     78 year old male presents with rectal bleeding x1 day.  Described blood mixed in stool followed by large amount of bright red blood per rectum.  Denies any abdominal discomfort.  No fever or chills.  No emesis.  History of same secondary to diverticulosis.  Denies any blood thinner use.  Nothing makes his symptoms better no treatment use prior to arrival.     Past Medical History:  Diagnosis Date  . ALLERGIC RHINITIS 07/13/2007  . HYPERLIPIDEMIA 07/13/2007  . HYPERTENSION 07/13/2007  . 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 PEPTIC ULCER DISEASE 07/13/2007  . 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 Syncope 11/15/2018  . Diverticulitis 05/25/2018  . Acute blood loss anemia 05/25/2018  . GI bleeding 05/24/2018  . 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 Impaired glucose tolerance 09/27/2016  . GERD (gastroesophageal reflux disease) 09/27/2016  . BPH (benign prostatic hyperplasia) 06/18/2013  . 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 Dyslipidemia 07/13/2007  . Essential hypertension 07/13/2007  . Allergic rhinitis 07/13/2007  . PEPTIC ULCER DISEASE 07/13/2007  . 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 ROTATOR CUFF REPAIR          Home Medications    Prior to Admission medications   Medication Sig Start Date End Date Taking? Authorizing Provider  dorzolamide-timolol (COSOPT) 22.3-6.8 MG/ML ophthalmic solution Place 1 drop into the left eye daily.  04/21/18   [provider]  fluticasone (FLONASE) 50 MCG/ACT  nasal spray USE 2 SPRAYS IN EACH  NOSTRIL DAILY Patient taking differently: USE 2 SPRAYS IN EACH  NOSTRIL DAILY AS NEEDED FOR ALLERGIES 03/23/18   Marletta Lor, MD  latanoprost (XALATAN) 0.005 % ophthalmic solution Place 1 drop into both eyes at bedtime. 06/20/17   Marletta Lor, MD  levocetirizine (XYZAL) 5 MG tablet TAKE 1 TABLET BY MOUTH  EVERY EVENING Patient not taking: Reported on 01/18/2019 12/30/17   Marletta Lor, MD  losartan (COZAAR) 100 MG tablet TAKE 1 TABLET BY MOUTH  DAILY 02/20/18   Marletta Lor, MD  naproxen (NAPROSYN) 500 MG tablet TAKE 1 TABLET BY MOUTH TWO  TIMES DAILY WITH MEAL(S) 01/24/19   Isaac Bliss, Rayford Halsted, MD  omeprazole (PRILOSEC) 20 MG capsule TAKE 1 CAPSULE BY MOUTH  DAILY 09/28/18   Dorena Cookey, MD  psyllium (HYDROCIL/METAMUCIL) 95 % PACK Take 1 packet by mouth daily. 05/29/18   Eugenie Filler, MD  tadalafil (CIALIS) 5 MG tablet Take 1 tablet (5 mg total) by mouth daily as needed for erectile dysfunction. 05/03/14   Marletta Lor, MD  Testosterone Cypionate 100 MG/ML SOLN Inject 100 mg as directed every 14 (fourteen) days. 12/05/18   Isaac Bliss, Rayford Halsted, MD    Family History History reviewed. No pertinent family history.  Social History Social History   Tobacco Use  . Smoking status: Never Smoker  . Smokeless tobacco: Never Used  Substance Use Topics  . Alcohol use: Yes    Comment: couple times a week - wine  . Drug use: No  Allergies   Lisinopril and Tramadol hcl   Review of Systems Review of Systems  All other systems reviewed and are negative.    Physical Exam Updated Vital Signs BP 134/76 (BP Location: Left Arm)   Pulse 83   Temp 98.1 F (36.7 C) (Oral)   Resp 20   Ht 1.619 m (5' 3.75")   Wt 79.8 kg   SpO2 97%   BMI 30.45 kg/m   Physical Exam Vitals signs and nursing note reviewed.  Constitutional:      General: He is not in acute distress.    Appearance: Normal appearance. He  is well-developed. He is not toxic-appearing.  HENT:     Head: Normocephalic and atraumatic.  Eyes:     General: Lids are normal.     Conjunctiva/sclera: Conjunctivae normal.     Pupils: Pupils are equal, round, and reactive to light.  Neck:     Musculoskeletal: Normal range of motion and neck supple.     Thyroid: No thyroid mass.     Trachea: No tracheal deviation.  Cardiovascular:     Rate and Rhythm: Normal rate and regular rhythm.     Heart sounds: Normal heart sounds. No murmur. No gallop.   Pulmonary:     Effort: Pulmonary effort is normal. No respiratory distress.     Breath sounds: Normal breath sounds. No stridor. No decreased breath sounds, wheezing, rhonchi or rales.  Abdominal:     General: Bowel sounds are normal. There is no distension.     Palpations: Abdomen is soft.     Tenderness: There is no abdominal tenderness. There is no guarding or rebound.  Genitourinary:    Comments: Gross blood per rectum noted Musculoskeletal: Normal range of motion.        General: No tenderness.  Skin:    General: Skin is warm and dry.     Findings: No abrasion or rash.  Neurological:     Mental Status: He is alert and oriented to person, place, and time.     GCS: GCS eye subscore is 4. GCS verbal subscore is 5. GCS motor subscore is 6.     Cranial Nerves: No cranial nerve deficit.     Sensory: No sensory deficit.  Psychiatric:        Speech: Speech normal.        Behavior: Behavior normal.      ED Treatments / Results  Labs (all labs ordered are listed, but only abnormal results are displayed) Labs Reviewed  COMPREHENSIVE METABOLIC PANEL  CBC  POC OCCULT BLOOD, ED  TYPE AND SCREEN    EKG None  Radiology No results found.  Procedures Procedures (including critical care time)  Medications Ordered in ED Medications - No data to display   Initial Impression / Assessment and Plan / ED Course  I have reviewed the triage vital signs and the nursing notes.   Pertinent labs & imaging results that were available during my care of the patient were reviewed by me and considered in my medical decision making (see chart for details).      Patient's hemoglobin is stable here.  Does have bloody stools on exam.  Patient will be admitted for evaluation of GI bleed.  Final Clinical Impressions(s) / ED Diagnoses   Final diagnoses:  None    ED Discharge Orders    None       Lacretia Leigh, MD 02/13/19 2010

## 2019-02-13 NOTE — H&P (Signed)
History and Physical    David Goodman NAT:[id number] DOB: [birth year] DOA: 02/13/2019  PCP: Isaac Bliss, Rayford Halsted, MD  Patient coming from: Home.  Chief Complaint: Rectal bleeding.  HPI: David Goodman is a 78 y.o. male with history of hypertension, alcohol abuse who was admitted in June 2019 for diverticular bleed at the time patient also had diverticulitis presents to the ER with complaints of having passed frank blood 3 times prior to coming.  Patient started having frank rectal bleeding since afternoon today.  Denies any abdominal pain nausea or vomiting.  Takes naproxen.  Denies taking aspirin.  Denies chest pain or shortness of breath.  ED Course: In the ER patient had 2 more bowel movements which were bloody.  Hemodynamically stable hemoglobin around 13.  Abdomen appears benign on exam admitted for further management of GI bleed likely diverticular.  Review of Systems: As per HPI, rest all negative.   Past Medical History:  Diagnosis Date  . ALLERGIC RHINITIS 07/13/2007  . HYPERLIPIDEMIA 07/13/2007  . HYPERTENSION 07/13/2007  . 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 PEPTIC ULCER DISEASE 07/13/2007  . 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 ROTATOR CUFF REPAIR       reports that he has never smoked. He has never used smokeless tobacco. He reports current alcohol use. He reports that he does not use drugs.  Allergies  Allergen Reactions  . Lisinopril Cough    Cough  . Tramadol Hcl     REACTION: unspecified    History reviewed. No pertinent family history.  Prior to Admission medications   Medication Sig Start Date End Date Taking? Authorizing Provider  Ascorbic Acid (VITAMIN C PO) Take 1 tablet by mouth daily.   Yes [provider]  diphenhydrAMINE (BENADRYL) 25 MG tablet Take 25 mg by mouth daily as needed for allergies.   Yes [provider]  fluticasone (FLONASE) 50 MCG/ACT nasal spray USE  2 SPRAYS IN EACH  NOSTRIL DAILY Patient taking differently: USE 2 SPRAYS IN EACH  NOSTRIL DAILY AS NEEDED FOR ALLERGIES 03/23/18  Yes Marletta Lor, MD  latanoprost (XALATAN) 0.005 % ophthalmic solution Place 1 drop into both eyes at bedtime. 06/20/17  Yes Marletta Lor, MD  losartan (COZAAR) 100 MG tablet TAKE 1 TABLET BY MOUTH  DAILY Patient taking differently: Take 100 mg by mouth daily.  02/20/18  Yes Marletta Lor, MD  MAGNESIUM PO Take 1 tablet by mouth daily.   Yes [provider]  naproxen (NAPROSYN) 500 MG tablet TAKE 1 TABLET BY MOUTH TWO  TIMES DAILY WITH MEAL(S) Patient taking differently: Take 500 mg by mouth 2 (two) times daily as needed (knee pain).  01/24/19  Yes Isaac Bliss, Rayford Halsted, MD  omeprazole (PRILOSEC) 20 MG capsule TAKE 1 CAPSULE BY MOUTH  DAILY Patient taking differently: Take 20 mg by mouth daily.  09/28/18  Yes Dorena Cookey, MD  psyllium (HYDROCIL/METAMUCIL) 95 % PACK Take 1 packet by mouth daily. 05/29/18  Yes Eugenie Filler, MD  tadalafil (CIALIS) 5 MG tablet Take 1 tablet (5 mg total) by mouth daily as needed for erectile dysfunction. 05/03/14  Yes Marletta Lor, MD  Testosterone Cypionate 100 MG/ML SOLN Inject 100 mg as directed every 14 (fourteen) days. 12/05/18  Yes Isaac Bliss, Rayford Halsted, MD  levocetirizine (XYZAL) 5 MG tablet TAKE 1 TABLET BY MOUTH  EVERY EVENING Patient not taking: Reported on 01/18/2019 12/30/17   Burnice Logan,  Doretha Sou, MD    Physical Exam: Vitals:   02/13/19 1730 02/13/19 1757 02/13/19 1900 02/13/19 2023  BP: (!) 149/97 134/76 129/72 (!) 143/81  Pulse: 86 83 75 83  Resp: 17 20 (!) 21 14  Temp: 98.1 F (36.7 C)     TempSrc: Oral     SpO2: 95% 97% 95% 98%  Weight:      Height:          Constitutional: Moderately built and nourished. Vitals:   02/13/19 1730 02/13/19 1757 02/13/19 1900 02/13/19 2023  BP: (!) 149/97 134/76 129/72 (!) 143/81  Pulse: 86 83 75 83  Resp: 17 20 (!) 21 14   Temp: 98.1 F (36.7 C)     TempSrc: Oral     SpO2: 95% 97% 95% 98%  Weight:      Height:       Eyes: Anicteric no pallor. ENMT: No discharge from the ears eyes nose and mouth. Neck: No mass felt.  No neck rigidity. Respiratory: No rhonchi or crepitations. Cardiovascular: S1-S2 heard. Abdomen: Soft nontender bowel sounds present. Musculoskeletal: No edema.  No joint effusion. Skin: No rash. Neurologic: Alert awake oriented to time place and person.  Moves all extremities. Psychiatric: Appears normal.  Normal affect.   Labs on Admission: I have personally reviewed following labs and imaging studies  CBC: Recent Labs  Lab 02/13/19 1732  WBC 9.1  HGB 13.4  HCT 38.0*  MCV 91.3  PLT 850   Basic Metabolic Panel: Recent Labs  Lab 02/13/19 1732  NA 139  K 3.8  CL 108  CO2 22  GLUCOSE 111*  BUN 30*  CREATININE 1.01  CALCIUM 8.8*   GFR: Estimated Creatinine Clearance: 58.1 mL/min (by C-G formula based on SCr of 1.01 mg/dL). Liver Function Tests: Recent Labs  Lab 02/13/19 1732  AST 28  ALT 25  ALKPHOS 49  BILITOT 0.6  PROT 6.2*  ALBUMIN 4.0   No results for input(s): LIPASE, AMYLASE in the last 168 hours. No results for input(s): AMMONIA in the last 168 hours. Coagulation Profile: No results for input(s): INR, PROTIME in the last 168 hours. Cardiac Enzymes: No results for input(s): CKTOTAL, CKMB, CKMBINDEX, TROPONINI in the last 168 hours. BNP (last 3 results) No results for input(s): PROBNP in the last 8760 hours. HbA1C: No results for input(s): HGBA1C in the last 72 hours. CBG: No results for input(s): GLUCAP in the last 168 hours. Lipid Profile: No results for input(s): CHOL, HDL, LDLCALC, TRIG, CHOLHDL, LDLDIRECT in the last 72 hours. Thyroid Function Tests: No results for input(s): TSH, T4TOTAL, FREET4, T3FREE, THYROIDAB in the last 72 hours. Anemia Panel: No results for input(s): VITAMINB12, FOLATE, FERRITIN, TIBC, IRON, RETICCTPCT in the last 72  hours. Urine analysis:    Component Value Date/Time   COLORURINE LT. YELLOW 05/22/2012 [place] 05/22/2012 0824   LABSPEC 1.025 05/22/2012 0824   PHURINE 6.0 05/22/2012 0824   GLUCOSEU NEGATIVE 05/22/2012 0824   HGBUR NEGATIVE 05/22/2012 0824   HGBUR negative 07/09/2009 0852   BILIRUBINUR n 09/21/2016 0925   KETONESUR NEGATIVE 05/22/2012 0824   PROTEINUR n 09/21/2016 0925   UROBILINOGEN 0.2 09/21/2016 0925   UROBILINOGEN 0.2 05/22/2012 0824   NITRITE n 09/21/2016 0925   NITRITE NEGATIVE 05/22/2012 0824   LEUKOCYTESUR Negative 09/21/2016 0925   Sepsis Labs: @LABRCNTIP (procalcitonin:4,lacticidven:4) )No results found for this or any previous visit (from the past 240 hour(s)).   Radiological Exams on Admission: No results found.  Assessment/Plan Principal Problem:   Acute GI bleeding Active Problems:   Essential hypertension    1. Acute GI bleed likely lower GI diverticular origin possibly has had a similar episode last year in June 2019.  We will keep n.p.o. follow CBC.  Since patient also takes naproxen we will keep patient on Protonix. 2. Hypertension presently n.p.o. we will keep patient on PRN IV hydralazine. 3. History of BPH will continue Flomax once patient can take orally. 4. History of alcohol abuse on CIWA protocol.   DVT prophylaxis: SCDs. Code Status: Full code. Family Communication: Discussed with patient. Disposition Plan: Home. Consults called: None. Admission status: Observation.   Rise Patience MD [facility] Pager [phone].  If 7PM-7AM, please contact night-coverage [email or web address] Password [facility]  02/13/2019, 8:51 PM

## 2019-02-14 DIAGNOSIS — K625 Hemorrhage of anus and rectum: Secondary | ICD-10-CM

## 2019-02-14 LAB — CBC WITH DIFFERENTIAL/PLATELET
Abs Immature Granulocytes: 0.03 10*3/uL (ref 0.00–0.07)
Basophils Absolute: 0 10*3/uL (ref 0.0–0.1)
Basophils Relative: 0 %
Eosinophils Absolute: 0.9 10*3/uL — ABNORMAL HIGH (ref 0.0–0.5)
Eosinophils Relative: 9 %
HCT: 25.1 % — ABNORMAL LOW (ref 39.0–52.0)
Hemoglobin: 8.5 g/dL — ABNORMAL LOW (ref 13.0–17.0)
IMMATURE GRANULOCYTES: 0 %
Lymphocytes Relative: 26 %
Lymphs Abs: 2.7 10*3/uL (ref 0.7–4.0)
MCH: 31.6 pg (ref 26.0–34.0)
MCHC: 33.9 g/dL (ref 30.0–36.0)
MCV: 93.3 fL (ref 80.0–100.0)
Monocytes Absolute: 0.6 10*3/uL (ref 0.1–1.0)
Monocytes Relative: 6 %
NEUTROS ABS: 5.9 10*3/uL (ref 1.7–7.7)
NEUTROS PCT: 59 %
NRBC: 0 % (ref 0.0–0.2)
PLATELETS: 213 10*3/uL (ref 150–400)
RBC: 2.69 MIL/uL — ABNORMAL LOW (ref 4.22–5.81)
RDW: 13.5 % (ref 11.5–15.5)
WBC: 10.1 10*3/uL (ref 4.0–10.5)

## 2019-02-14 LAB — CBC
HCT: 35.6 % — ABNORMAL LOW (ref 39.0–52.0)
HEMATOCRIT: 35.6 % — AB (ref 39.0–52.0)
Hemoglobin: 12.3 g/dL — ABNORMAL LOW (ref 13.0–17.0)
Hemoglobin: 12.3 g/dL — ABNORMAL LOW (ref 13.0–17.0)
MCH: 31.9 pg (ref 26.0–34.0)
MCH: 31.9 pg (ref 26.0–34.0)
MCHC: 34.6 g/dL (ref 30.0–36.0)
MCHC: 34.6 g/dL (ref 30.0–36.0)
MCV: 92.5 fL (ref 80.0–100.0)
MCV: 92.5 fL (ref 80.0–100.0)
Platelets: 195 10*3/uL (ref 150–400)
Platelets: 196 10*3/uL (ref 150–400)
RBC: 3.85 MIL/uL — ABNORMAL LOW (ref 4.22–5.81)
RBC: 3.85 MIL/uL — ABNORMAL LOW (ref 4.22–5.81)
RDW: 13.4 % (ref 11.5–15.5)
RDW: 13.5 % (ref 11.5–15.5)
WBC: 7.2 10*3/uL (ref 4.0–10.5)
WBC: 8 10*3/uL (ref 4.0–10.5)
nRBC: 0 % (ref 0.0–0.2)
nRBC: 0 % (ref 0.0–0.2)

## 2019-02-14 LAB — GLUCOSE, CAPILLARY
Glucose-Capillary: 106 mg/dL — ABNORMAL HIGH (ref 70–99)
Glucose-Capillary: 135 mg/dL — ABNORMAL HIGH (ref 70–99)
Glucose-Capillary: 87 mg/dL (ref 70–99)
Glucose-Capillary: 84 mg/dL (ref 70–99)

## 2019-02-14 LAB — BASIC METABOLIC PANEL
Anion gap: 6 (ref 5–15)
BUN: 33 mg/dL — AB (ref 8–23)
CO2: 24 mmol/L (ref 22–32)
Calcium: 8.3 mg/dL — ABNORMAL LOW (ref 8.9–10.3)
Chloride: 109 mmol/L (ref 98–111)
Creatinine, Ser: 0.92 mg/dL (ref 0.61–1.24)
GFR calc Af Amer: >60 mL/min (ref >60)
GFR calc non Af Amer: >60 mL/min (ref >60)
Glucose, Bld: 112 mg/dL — ABNORMAL HIGH (ref 70–99)
Potassium: 3.8 mmol/L (ref 3.5–5.1)
Sodium: 139 mmol/L (ref 135–145)

## 2019-02-14 LAB — HEMOGLOBIN AND HEMATOCRIT, BLOOD
HCT: 28.3 % — ABNORMAL LOW (ref 39.0–52.0)
HEMOGLOBIN: 9.6 g/dL — AB (ref 13.0–17.0)

## 2019-02-14 MED ORDER — SODIUM CHLORIDE 0.9 % IV SOLN
INTRAVENOUS | Status: DC
  Administered 2019-02-14 – 2019-02-17 (×5): via INTRAVENOUS

## 2019-02-14 MED ORDER — PANTOPRAZOLE SODIUM 40 MG PO TBEC
40.0000 mg | DELAYED_RELEASE_TABLET | Freq: Every day | ORAL | Status: DC
  Administered 2019-02-14 – 2019-02-18 (×5): 40 mg via ORAL
  Filled 2019-02-14 (×4): qty 1

## 2019-02-14 MED ORDER — VITAMIN B-1 100 MG PO TABS
100.0000 mg | ORAL_TABLET | Freq: Every day | ORAL | Status: DC
  Administered 2019-02-14 – 2019-02-18 (×5): 100 mg via ORAL
  Filled 2019-02-14 (×5): qty 1

## 2019-02-14 MED ORDER — PANTOPRAZOLE SODIUM 40 MG IV SOLR
40.0000 mg | Freq: Two times a day (BID) | INTRAVENOUS | Status: DC
  Filled 2019-02-14: qty 40

## 2019-02-14 MED ORDER — LORAZEPAM 1 MG PO TABS: 1.0000 mg | ORAL_TABLET | Freq: Four times a day (QID) | ORAL | Status: AC | PRN

## 2019-02-14 MED ORDER — LORAZEPAM 2 MG/ML IJ SOLN: 1.0000 mg | Freq: Four times a day (QID) | INTRAMUSCULAR | Status: AC | PRN

## 2019-02-14 MED ORDER — HYDROCORTISONE ACETATE 25 MG RE SUPP
25.0000 mg | Freq: Two times a day (BID) | RECTAL | Status: DC
  Administered 2019-02-15 (×2): 25 mg via RECTAL
  Filled 2019-02-14 (×9): qty 1

## 2019-02-14 MED ORDER — THIAMINE HCL 100 MG/ML IJ SOLN: 100.0000 mg | Freq: Every day | INTRAMUSCULAR | Status: DC

## 2019-02-14 MED ORDER — ADULT MULTIVITAMIN W/MINERALS CH
1.0000 | ORAL_TABLET | Freq: Every day | ORAL | Status: DC
  Administered 2019-02-14 – 2019-02-18 (×5): 1 via ORAL
  Filled 2019-02-14 (×5): qty 1

## 2019-02-14 MED ORDER — HYDRALAZINE HCL 20 MG/ML IJ SOLN
5.0000 mg | INTRAMUSCULAR | Status: DC | PRN
  Administered 2019-02-15: 5 mg via INTRAVENOUS
  Filled 2019-02-14: qty 1

## 2019-02-14 MED ORDER — FOLIC ACID 1 MG PO TABS
1.0000 mg | ORAL_TABLET | Freq: Every day | ORAL | Status: DC
  Administered 2019-02-14 – 2019-02-18 (×5): 1 mg via ORAL
  Filled 2019-02-14 (×5): qty 1

## 2019-02-14 NOTE — Progress Notes (Signed)
othostatic changes while getting out of bed to bathroom.  Patient became very dizzy during bowel movement(frank loose approx 100 cc.  Patient was able to call this writer, assisted back to bed, placed in trendelenburg position for 1-2 minutes with good relief  Continue to monitor

## 2019-02-14 NOTE — Progress Notes (Signed)
PROGRESS NOTE  David Goodman UKG:[id number] DOB: 1941/01/15 DOA: 02/13/2019 PCP: Isaac Bliss, Rayford Halsted, MD   LOS: 0 days   Brief Narrative / Interim history: David Goodman is a 78 y.o. male with history of hypertension, alcohol abuse who was admitted in June 2019 for diverticular bleed at the time patient also had diverticulitis presents to the ER with complaints of having passed frank blood 3 times prior to coming.  Patient started having frank rectal bleeding since afternoon today.  Denies any abdominal pain nausea or vomiting.  Takes naproxen.  Denies taking aspirin.  Denies chest pain or shortness of breath.  Subjective: Feels well, denies any abdominal pain, no nausea or vomiting.  Tells me he is still seeing blood in his stool and had 3 episodes overnight  Assessment & Plan: Principal Problem:   Acute GI bleeding Active Problems:   Essential hypertension   Principal Problem -GI bleed,?  Diverticular versus hemorrhoidal, gastroenterology consulted, appreciate input.  For now continue to closely monitor.  He is not bleeding as much as to warrant a tagged RBC scan but will need 1 should clinical picture change -Continue Protonix -Hemoglobin is stable  Active Problems Hypertension -Blood pressure stable, hold home medications  History of alcohol abuse -Continue CIWA   Scheduled Meds: . folic acid  1 mg Oral Daily  . hydrocortisone  25 mg Rectal BID  . latanoprost  1 drop Both Eyes QHS  . multivitamin with minerals  1 tablet Oral Daily  . pantoprazole  40 mg Oral Daily  . thiamine  100 mg Oral Daily   Or  . thiamine  100 mg Intravenous Daily   Continuous Infusions: . sodium chloride 75 mL/hr at 02/13/19 2118   PRN Meds:.acetaminophen **OR** acetaminophen, hydrALAZINE, LORazepam **OR** LORazepam, ondansetron **OR** ondansetron (ZOFRAN) IV, polyvinyl alcohol   DVT prophylaxis: SCDs Code Status: Full code Family Communication: no family at bedside  Disposition  Plan: home when ready   Consultants:   GI  Procedures:   None   Antimicrobials:  None    Objective: Vitals:   02/13/19 1757 02/13/19 1900 02/13/19 2023 02/13/19 2107  BP: 134/76 129/72 (!) 143/81 (!) 145/77  Pulse: 83 75 83 73  Resp: 20 (!) 21 14 19   Temp:    98 F (36.7 C)  TempSrc:      SpO2: 97% 95% 98% 92%  Weight:      Height:        Intake/Output Summary (Last 24 hours) at 02/14/2019 1246 Last data filed at 02/14/2019 1233 Gross per 24 hour  Intake 1012.6 ml  Output -  Net 1012.6 ml   Filed Weights   02/13/19 1729  Weight: 79.8 kg    Examination:  Constitutional: NAD Eyes: PERRL, lids and conjunctivae normal ENMT: Mucous membranes are moist. No oropharyngeal exudates Neck: normal, supple Respiratory: clear to auscultation bilaterally, no wheezing, no crackles. Normal respiratory effort.  Cardiovascular: Regular rate and rhythm, no murmurs / rubs / gallops.  Abdomen: no tenderness. Bowel sounds positive.  Musculoskeletal: no clubbing / cyanosis.  Skin: no rashes Neurologic: non focal    Data Reviewed: I have independently reviewed following labs and imaging studies   CBC: Recent Labs  Lab 02/13/19 1732 02/13/19 2104 02/14/19 0045 02/14/19 0535  WBC 9.1 10.3 8.0 7.2  HGB 13.4 13.5 12.3* 12.3*  HCT 38.0* 39.0 35.6* 35.6*  MCV 91.3 91.8 92.5 92.5  PLT 234 241 196 762   Basic Metabolic Panel: Recent Labs  Lab 02/13/19 1732  02/14/19 0535  NA 139 139  K 3.8 3.8  CL 108 109  CO2 22 24  GLUCOSE 111* 112*  BUN 30* 33*  CREATININE 1.01 0.92  CALCIUM 8.8* 8.3*   GFR: Estimated Creatinine Clearance: 63.8 mL/min (by C-G formula based on SCr of 0.92 mg/dL). Liver Function Tests: Recent Labs  Lab 02/13/19 1732  AST 28  ALT 25  ALKPHOS 49  BILITOT 0.6  PROT 6.2*  ALBUMIN 4.0   No results for input(s): LIPASE, AMYLASE in the last 168 hours. No results for input(s): AMMONIA in the last 168 hours. Coagulation Profile: No results for  input(s): INR, PROTIME in the last 168 hours. Cardiac Enzymes: No results for input(s): CKTOTAL, CKMB, CKMBINDEX, TROPONINI in the last 168 hours. BNP (last 3 results) No results for input(s): PROBNP in the last 8760 hours. HbA1C: No results for input(s): HGBA1C in the last 72 hours. CBG: Recent Labs  Lab 02/14/19 0730 02/14/19 1129  GLUCAP 106* 135*   Lipid Profile: No results for input(s): CHOL, HDL, LDLCALC, TRIG, CHOLHDL, LDLDIRECT in the last 72 hours. Thyroid Function Tests: No results for input(s): TSH, T4TOTAL, FREET4, T3FREE, THYROIDAB in the last 72 hours. Anemia Panel: No results for input(s): VITAMINB12, FOLATE, FERRITIN, TIBC, IRON, RETICCTPCT in the last 72 hours. Urine analysis:    Component Value Date/Time   COLORURINE LT. YELLOW 05/22/2012 0824   APPEARANCEUR CLEAR 05/22/2012 0824   LABSPEC 1.025 05/22/2012 0824   PHURINE 6.0 05/22/2012 0824   GLUCOSEU NEGATIVE 05/22/2012 0824   HGBUR NEGATIVE 05/22/2012 0824   HGBUR negative 07/09/2009 0852   BILIRUBINUR n 09/21/2016 0925   KETONESUR NEGATIVE 05/22/2012 0824   PROTEINUR n 09/21/2016 0925   UROBILINOGEN 0.2 09/21/2016 0925   UROBILINOGEN 0.2 05/22/2012 0824   NITRITE n 09/21/2016 0925   NITRITE NEGATIVE 05/22/2012 0824   LEUKOCYTESUR Negative 09/21/2016 0925   Sepsis Labs: Invalid input(s): PROCALCITONIN, LACTICIDVEN  No results found for this or any previous visit (from the past 240 hour(s)).    Radiology Studies: No results found.   Marzetta Board, MD, PhD [facility]  Contact via  [email or web address]  [place] P: [phone]  F: [phone]

## 2019-02-14 NOTE — Consult Note (Signed)
Referring Provider: [place] Primary Care Physician:  Isaac Bliss, Rayford Halsted, MD Primary Gastroenterologist:  Dr. Watt Climes  Reason for Consultation:   GI bleed  HPI: David Goodman is a 78 y.o. male with past medical history of hypertension and history of diverticular bleed in 2019 admitted to the hospital for further evaluation of rectal bleeding.  Patient with history of constipation and uses Metamucil on a daily basis.  Yesterday after having bowel movement he started noticing rectal bleeding.  Described as maroon and red-colored blood.  Denied any associated abdominal pain, nausea or vomiting.  Denies dysphagia odynophagia.  Last bleeding episode this morning.   Had colonoscopy in October 2019 by Dr. 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  Repeat colonoscopy recommended in 5 years.  EGD in October 2019 showed small hyperplastic polyp in the gastric cardia and mild gastritis.  Repeat EGD recommended in 3 years.  Past Medical History:  Diagnosis Date  . ALLERGIC RHINITIS 07/13/2007  . HYPERLIPIDEMIA 07/13/2007  . HYPERTENSION 07/13/2007  . 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 PEPTIC ULCER DISEASE 07/13/2007  . 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 ROTATOR CUFF REPAIR      Prior to Admission medications   Medication Sig Start Date End Date Taking? Authorizing Provider  Ascorbic Acid (VITAMIN C PO) Take 1 tablet by mouth daily.   Yes [provider]  diphenhydrAMINE (BENADRYL) 25 MG tablet Take 25 mg by mouth daily as needed for allergies.   Yes [provider]  fluticasone (FLONASE) 50 MCG/ACT nasal spray USE 2 SPRAYS IN EACH  NOSTRIL DAILY Patient taking differently: USE 2 SPRAYS IN EACH  NOSTRIL DAILY AS NEEDED FOR ALLERGIES 03/23/18  Yes Marletta Lor, MD  latanoprost (XALATAN) 0.005 % ophthalmic solution Place 1 drop into both eyes  at bedtime. 06/20/17  Yes Marletta Lor, MD  losartan (COZAAR) 100 MG tablet TAKE 1 TABLET BY MOUTH  DAILY Patient taking differently: Take 100 mg by mouth daily.  02/20/18  Yes Marletta Lor, MD  MAGNESIUM PO Take 1 tablet by mouth daily.   Yes [provider]  naproxen (NAPROSYN) 500 MG tablet TAKE 1 TABLET BY MOUTH TWO  TIMES DAILY WITH MEAL(S) Patient taking differently: Take 500 mg by mouth 2 (two) times daily as needed (knee pain).  01/24/19  Yes Isaac Bliss, Rayford Halsted, MD  omeprazole (PRILOSEC) 20 MG capsule TAKE 1 CAPSULE BY MOUTH  DAILY Patient taking differently: Take 20 mg by mouth daily.  09/28/18  Yes Dorena Cookey, MD  psyllium (HYDROCIL/METAMUCIL) 95 % PACK Take 1 packet by mouth daily. 05/29/18  Yes Eugenie Filler, MD  tadalafil (CIALIS) 5 MG tablet Take 1 tablet (5 mg total) by mouth daily as needed for erectile dysfunction. 05/03/14  Yes Marletta Lor, MD  Testosterone Cypionate 100 MG/ML SOLN Inject 100 mg as directed every 14 (fourteen) days. 12/05/18  Yes Isaac Bliss, Rayford Halsted, MD  levocetirizine (XYZAL) 5 MG tablet TAKE 1 TABLET BY MOUTH  EVERY EVENING Patient not taking: Reported on 01/18/2019 12/30/17   Marletta Lor, MD    Scheduled Meds: . folic acid  1 mg Oral Daily  . latanoprost  1 drop Both Eyes QHS  . multivitamin with minerals  1 tablet Oral Daily  . pantoprazole (PROTONIX) IV  40 mg Intravenous Q12H  . thiamine  100 mg Oral Daily  Or  . thiamine  100 mg Intravenous Daily   Continuous Infusions: . sodium chloride 75 mL/hr at 02/13/19 2118   PRN Meds:.acetaminophen **OR** acetaminophen, hydrALAZINE, LORazepam **OR** LORazepam, ondansetron **OR** ondansetron (ZOFRAN) IV, polyvinyl alcohol  Allergies as of 02/13/2019 - Review Complete 02/13/2019  Allergen Reaction Noted  . Lisinopril Cough 06/06/2015  . Tramadol hcl  02/23/2007    History reviewed. No pertinent family history.  Social History   Socioeconomic  History  . Marital status: Married    Spouse name: Not on file  . Number of children: Not on file  . Years of education: Not on file  . Highest education level: Not on file  Occupational History  . Not on file  Social Needs  . Financial resource strain: Not on file  . Food insecurity:    Worry: Not on file    Inability: Not on file  . Transportation needs:    Medical: Not on file    Non-medical: Not on file  Tobacco Use  . Smoking status: Never Smoker  . Smokeless tobacco: Never Used  Substance and Sexual Activity  . Alcohol use: Yes    Comment: couple times a week - wine  . Drug use: No  . Sexual activity: Not Currently  Lifestyle  . Physical activity:    Days per week: Not on file    Minutes per session: Not on file  . Stress: Not on file  Relationships  . Social connections:    Talks on phone: Not on file    Gets together: Not on file    Attends religious service: Not on file    Active member of club or organization: Not on file    Attends meetings of clubs or organizations: Not on file    Relationship status: Not on file  . Intimate partner violence:    Fear of current or ex partner: Not on file    Emotionally abused: Not on file    Physically abused: Not on file    Forced sexual activity: Not on file  Other Topics Concern  . Not on file  Social History Narrative  . Not on file    Review of Systems: Review of Systems  Constitutional: Negative for chills, fever and weight loss.  HENT: Positive for hearing loss. Negative for tinnitus.   Eyes: Negative for blurred vision and double vision.  Respiratory: Negative for cough, hemoptysis and sputum production.   Cardiovascular: Negative for chest pain and palpitations.  Gastrointestinal: Positive for blood in stool and constipation. Negative for abdominal pain, diarrhea, heartburn, nausea and vomiting.  Genitourinary: Negative for dysuria and urgency.  Musculoskeletal: Negative for back pain, myalgias and neck  pain.  Skin: Negative for itching and rash.  Neurological: Negative for seizures and loss of consciousness.  Endo/Heme/Allergies: Does not bruise/bleed easily.  Psychiatric/Behavioral: Negative for memory loss. The patient is not nervous/anxious.     Physical Exam: Vital signs: Vitals:   02/13/19 2023 02/13/19 2107  BP: (!) 143/81 (!) 145/77  Pulse: 83 73  Resp: 14 19  Temp:  98 F (36.7 C)  SpO2: 98% 92%   Last BM Date: 02/13/19 Physical Exam  Constitutional: He is oriented to person, place, and time. He appears well-developed and well-nourished.  HENT:  Head: Atraumatic.  Mouth/Throat: Oropharynx is clear and moist. No oropharyngeal exudate.  Eyes: EOM are normal. No scleral icterus.  Neck: Normal range of motion. Neck supple.  Cardiovascular: Normal rate, regular rhythm and normal heart sounds.  Pulmonary/Chest: Effort normal and breath sounds normal. No respiratory distress.  Abdominal: Soft. Bowel sounds are normal. He exhibits no distension. There is no abdominal tenderness. There is no rebound and no guarding.  Musculoskeletal: Normal range of motion.        General: No edema.  Neurological: He is alert and oriented to person, place, and time.  Skin: Skin is warm. No erythema.  Psychiatric: He has a normal mood and affect. Judgment and thought content normal.  Vitals reviewed.   GI:  Lab Results: Recent Labs    02/13/19 2104 02/14/19 0045 02/14/19 0535  WBC 10.3 8.0 7.2  HGB 13.5 12.3* 12.3*  HCT 39.0 35.6* 35.6*  PLT 241 196 195   BMET Recent Labs    02/13/19 1732 02/14/19 0535  NA 139 139  K 3.8 3.8  CL 108 109  CO2 22 24  GLUCOSE 111* 112*  BUN 30* 33*  CREATININE 1.01 0.92  CALCIUM 8.8* 8.3*   LFT Recent Labs    02/13/19 1732  PROT 6.2*  ALBUMIN 4.0  AST 28  ALT 25  ALKPHOS 49  BILITOT 0.6   PT/INR No results for input(s): LABPROT, INR in the last 72 hours.   Studies/Results: No results found.  Impression/Plan: -Rectal  bleeding.  Most likely diverticular versus hemorrhoidal.  Colonoscopy in October 2019 showed few small polyp, diverticulosis and hemorrhoids. - blood loss anemia.  Hemoglobin stable at 12.3   Recommendations -------------------------- -DC IV Protonix.  Start p.o. Protonix once a day. -Full liquid diet -Anusol suppositories - Monitor H&H.  Hold off on bleeding scan for now. -GI will follow    LOS: 0 days   Otis Brace  MD, FACP 02/14/2019, 9:06 AM  Contact #  [phone]

## 2019-02-14 NOTE — Care Management Obs Status (Signed)
[place] NOTIFICATION   Patient Details  Name: Antoin Dargis MRN: [id number] Date of Birth: Sep 29, 1941   Medicare Observation Status Notification Given:  Yes    Leeroy Cha, RN 02/14/2019, 10:19 AM

## 2019-02-14 NOTE — Progress Notes (Signed)
Pt called out to the nurses desk that he had a bloody bowel movement. Upon entering the room this RN looked in the toilet and it was filled with bright red blood. MD paged and awaiting orders.

## 2019-02-14 NOTE — Progress Notes (Signed)
Notified by floor nurse of pt's mult bloody BM's.    VS remain stable.    Nurse states that pt is concerned but does not appear unstable.  Hospitalist has ordered H/H.  Have asked to be called with that result; if significantly lower than baseline, and if still having further hematochezia, we may want to consider bleeding scan.  Asked nurse to have pt use bedside commode instead of toilet, so we can better measure amount of blood loss; she indicates she had already asked him to do so, but he has used the toilet nevertheless.    Nurse will reinforce need to use bedside commode.  I encouraged her to let pt know she and I had talked, and that so far, his clinical course is c/w our leading dx of diverticular bleeding, which usually stops w/out need for intervention, so normally we observe the pt and don't intervene unless persistent or destabilizing bleeding occurs.  Cleotis Nipper, M.D. Pager [phone] If no answer or after 5 PM call [phone]

## 2019-02-15 DIAGNOSIS — Z79899 Other long term (current) drug therapy: Secondary | ICD-10-CM | POA: Diagnosis not present

## 2019-02-15 DIAGNOSIS — Z87442 Personal history of urinary calculi: Secondary | ICD-10-CM | POA: Diagnosis not present

## 2019-02-15 DIAGNOSIS — K922 Gastrointestinal hemorrhage, unspecified: Secondary | ICD-10-CM | POA: Diagnosis not present

## 2019-02-15 DIAGNOSIS — N400 Enlarged prostate without lower urinary tract symptoms: Secondary | ICD-10-CM | POA: Diagnosis present

## 2019-02-15 DIAGNOSIS — R58 Hemorrhage, not elsewhere classified: Secondary | ICD-10-CM | POA: Diagnosis not present

## 2019-02-15 DIAGNOSIS — K219 Gastro-esophageal reflux disease without esophagitis: Secondary | ICD-10-CM | POA: Diagnosis present

## 2019-02-15 DIAGNOSIS — Z791 Long term (current) use of non-steroidal anti-inflammatories (NSAID): Secondary | ICD-10-CM | POA: Diagnosis not present

## 2019-02-15 DIAGNOSIS — Z888 Allergy status to other drugs, medicaments and biological substances status: Secondary | ICD-10-CM | POA: Diagnosis not present

## 2019-02-15 DIAGNOSIS — Z86010 Personal history of colonic polyps: Secondary | ICD-10-CM | POA: Diagnosis not present

## 2019-02-15 DIAGNOSIS — E785 Hyperlipidemia, unspecified: Secondary | ICD-10-CM | POA: Diagnosis present

## 2019-02-15 DIAGNOSIS — K625 Hemorrhage of anus and rectum: Secondary | ICD-10-CM | POA: Diagnosis not present

## 2019-02-15 DIAGNOSIS — Z885 Allergy status to narcotic agent status: Secondary | ICD-10-CM | POA: Diagnosis not present

## 2019-02-15 DIAGNOSIS — Z8711 Personal history of peptic ulcer disease: Secondary | ICD-10-CM | POA: Diagnosis not present

## 2019-02-15 DIAGNOSIS — I10 Essential (primary) hypertension: Secondary | ICD-10-CM | POA: Diagnosis not present

## 2019-02-15 DIAGNOSIS — K5730 Diverticulosis of large intestine without perforation or abscess without bleeding: Secondary | ICD-10-CM | POA: Diagnosis not present

## 2019-02-15 DIAGNOSIS — D62 Acute posthemorrhagic anemia: Secondary | ICD-10-CM | POA: Diagnosis not present

## 2019-02-15 DIAGNOSIS — K921 Melena: Secondary | ICD-10-CM | POA: Diagnosis not present

## 2019-02-15 DIAGNOSIS — K5731 Diverticulosis of large intestine without perforation or abscess with bleeding: Secondary | ICD-10-CM | POA: Diagnosis not present

## 2019-02-15 LAB — CBC
HCT: 24 % — ABNORMAL LOW (ref 39.0–52.0)
HCT: 24 % — ABNORMAL LOW (ref 39.0–52.0)
Hemoglobin: 8.2 g/dL — ABNORMAL LOW (ref 13.0–17.0)
Hemoglobin: 7.8 g/dL — ABNORMAL LOW (ref 13.0–17.0)
MCH: 31.8 pg (ref 26.0–34.0)
MCH: 30.8 pg (ref 26.0–34.0)
MCHC: 34.2 g/dL (ref 30.0–36.0)
MCHC: 32.5 g/dL (ref 30.0–36.0)
MCV: 93 fL (ref 80.0–100.0)
MCV: 94.9 fL (ref 80.0–100.0)
NRBC: 0 % (ref 0.0–0.2)
Platelets: 168 10*3/uL (ref 150–400)
Platelets: 178 10*3/uL (ref 150–400)
RBC: 2.58 MIL/uL — ABNORMAL LOW (ref 4.22–5.81)
RBC: 2.53 MIL/uL — AB (ref 4.22–5.81)
RDW: 13.5 % (ref 11.5–15.5)
RDW: 13.5 % (ref 11.5–15.5)
WBC: 8.5 10*3/uL (ref 4.0–10.5)
WBC: 6.2 10*3/uL (ref 4.0–10.5)
nRBC: 0 % (ref 0.0–0.2)

## 2019-02-15 LAB — GLUCOSE, CAPILLARY
Glucose-Capillary: 100 mg/dL — ABNORMAL HIGH (ref 70–99)
Glucose-Capillary: 100 mg/dL — ABNORMAL HIGH (ref 70–99)
Glucose-Capillary: 105 mg/dL — ABNORMAL HIGH (ref 70–99)
Glucose-Capillary: 113 mg/dL — ABNORMAL HIGH (ref 70–99)
Glucose-Capillary: 123 mg/dL — ABNORMAL HIGH (ref 70–99)
Glucose-Capillary: 89 mg/dL (ref 70–99)

## 2019-02-15 LAB — BASIC METABOLIC PANEL
Anion gap: 7 (ref 5–15)
BUN: 22 mg/dL (ref 8–23)
CO2: 23 mmol/L (ref 22–32)
CREATININE: 0.85 mg/dL (ref 0.61–1.24)
Calcium: 7.6 mg/dL — ABNORMAL LOW (ref 8.9–10.3)
Chloride: 109 mmol/L (ref 98–111)
GFR calc Af Amer: >60 mL/min (ref >60)
GFR calc non Af Amer: >60 mL/min (ref >60)
Glucose, Bld: 119 mg/dL — ABNORMAL HIGH (ref 70–99)
Potassium: 3.7 mmol/L (ref 3.5–5.1)
Sodium: 139 mmol/L (ref 135–145)

## 2019-02-15 MED ORDER — SODIUM CHLORIDE 0.9 % IV BOLUS
500.0000 mL | Freq: Once | INTRAVENOUS | Status: AC
  Administered 2019-02-15: via INTRAVENOUS

## 2019-02-15 NOTE — Progress Notes (Signed)
[facility] Gastroenterology Progress Note  David Goodman 78 y.o. 1941/11/02  CC: Lower GI bleed   Subjective: Yesterday's events noted.  Multiple episodes of rectal bleeding.  Dr. Cristina Gong was contacted.  Initial plan was to do a bleeding scan but subsequently he has stopped bleeding.  No bowel movement this morning.  Hemoglobin relatively stable this morning.   ROS : Negative for chest pain shortness of breath.  Positive for weakness.  Positive for dizziness.   Objective: Vital signs in last 24 hours: Vitals:   02/15/19 0449 02/15/19 0451  BP: (!) 145/81 (!) 157/89  Pulse: 88 96  Resp: 20 (!) 21  Temp: (!) 97.5 F (36.4 C) (!) 97.5 F (36.4 C)  SpO2: 100% 99%    Physical Exam:  General:  Alert, cooperative, no distress, appears stated age  Head:  Normocephalic, without obvious abnormality, atraumatic  Eyes:  , EOM's intact,   Lungs:   Clear to auscultation bilaterally, respirations unlabored  Heart:  Regular rate and rhythm, S1, S2 normal  Abdomen:   Soft, non-tender, nondistended, bowel sounds present, no peritoneal signs  Extremities: Extremities normal, atraumatic, no  edema  Pulses: 2+ and symmetric    Lab Results: Recent Labs    02/14/19 0535 02/15/19 0521  NA 139 139  K 3.8 3.7  CL 109 109  CO2 24 23  GLUCOSE 112* 119*  BUN 33* 22  CREATININE 0.92 0.85  CALCIUM 8.3* 7.6*   Recent Labs    02/13/19 1732  AST 28  ALT 25  ALKPHOS 49  BILITOT 0.6  PROT 6.2*  ALBUMIN 4.0   Recent Labs    02/14/19 2254 02/15/19 0521  WBC 10.1 8.5  NEUTROABS 5.9  --   HGB 8.5* 8.2*  HCT 25.1* 24.0*  MCV 93.3 93.0  PLT 213 168   No results for input(s): LABPROT, INR in the last 72 hours.    Assessment/Plan: -Rectal bleeding.  Most likely diverticular   Colonoscopy in October 2019 showed few small polyp, diverticulosis and hemorrhoids. - blood loss anemia.  Hgb 8.2   Recommendations -------------------------- -We will keep bleeding scan on standby.  Nursing  staff has been advised to send patient for bleeding scan if he develops further bleeding episodes. -Monitor H&H.  Transfuse if hemoglobin less than 8. -Utility of flexible sigmoidoscopy uncertain because bleeding scan in June 2019 showed bleeding from transverse colon. Petra Kuba of diverticular bleed and supportive care discussed with the patient. -If ongoing bleeding with negative above-mentioned work-up, may need a repeat colonoscopy. -Okay to have clear liquid diet today.  GI will follow   Otis Brace MD, FACP 02/15/2019, 8:42 AM  Contact #  [phone]

## 2019-02-15 NOTE — Progress Notes (Signed)
PROGRESS NOTE  David Goodman YTK:[id number] DOB: [birth year] DOA: 02/13/2019 PCP: Isaac Bliss, Rayford Halsted, MD   LOS: 0 days   Brief Narrative / Interim history: David Goodman is a 78 y.o. male with history of hypertension, alcohol abuse who was admitted in June 2019 for diverticular bleed at the time patient also had diverticulitis presents to the ER with complaints of having passed frank blood 3 times prior to coming.  Patient started having frank rectal bleeding since afternoon today.  Denies any abdominal pain nausea or vomiting.  Takes naproxen.  Denies taking aspirin.  Denies chest pain or shortness of breath.  Subjective: Had more bleeding yesterday afternoon and last night, no bloody bowel movements this morning  Assessment & Plan: Principal Problem:   Acute GI bleeding Active Problems:   Essential hypertension   Principal Problem -GI bleed,?  Diverticular versus hemorrhoidal, gastroenterology consulted, appreciate input.  For now continue to closely monitor.  He is not bleeding as much as to warrant a tagged RBC scan but will need 1 should clinical picture change -Continue Protonix -Hemoglobin dropping some compared to yesterday, does not need transfusions yet, repeat hemoglobin this afternoon.  Tagged RBC scan on standby if patient has bleed will need to be done stat  Active Problems Hypertension -Blood pressure has remained stable, hold home medications  History of alcohol abuse -Not triggering CIWA   Scheduled Meds: . folic acid  1 mg Oral Daily  . hydrocortisone  25 mg Rectal BID  . latanoprost  1 drop Both Eyes QHS  . multivitamin with minerals  1 tablet Oral Daily  . pantoprazole  40 mg Oral Daily  . thiamine  100 mg Oral Daily   Or  . thiamine  100 mg Intravenous Daily   Continuous Infusions: . sodium chloride 100 mL/hr at 02/15/19 0446   PRN Meds:.acetaminophen **OR** acetaminophen, hydrALAZINE, LORazepam **OR** LORazepam, ondansetron **OR** ondansetron  (ZOFRAN) IV, polyvinyl alcohol   DVT prophylaxis: SCDs Code Status: Full code Family Communication: no family at bedside  Disposition Plan: home when ready   Consultants:   GI  Procedures:   None   Antimicrobials:  None    Objective: Vitals:   02/15/19 0451 02/15/19 0843 02/15/19 0844 02/15/19 0845  BP: (!) 157/89 136/73 133/78 136/73  Pulse: 96 86    Resp: (!) 21     Temp: (!) 97.5 F (36.4 C) 98 F (36.7 C)    TempSrc: Oral Oral    SpO2: 99% 100%    Weight:      Height:        Intake/Output Summary (Last 24 hours) at 02/15/2019 1039 Last data filed at 02/15/2019 0600 Gross per 24 hour  Intake 2643.68 ml  Output 1 ml  Net 2642.68 ml   Filed Weights   02/13/19 1729  Weight: 79.8 kg    Examination:  Constitutional: No distress, pleasant Eyes: No scleral icterus ENMT: Moist mucous membranes, Neck: Normal, supple Respiratory: Clear to auscultation bilaterally without wheezing or crackles, normal respiratory effort Cardiovascular: Regular rate and rhythm, no murmurs appreciated Abdomen: Soft, nontender, nondistended, positive bowel sounds Musculoskeletal: no clubbing / cyanosis.  Skin: No rashes appreciated Neurologic: No focal deficits, equal strength, ambulatory   Data Reviewed: I have independently reviewed following labs and imaging studies   CBC: Recent Labs  Lab 02/13/19 2104 02/14/19 0045 02/14/19 0535 02/14/19 1814 02/14/19 2254 02/15/19 0521  WBC 10.3 8.0 7.2  --  10.1 8.5  NEUTROABS  --   --   --   --  5.9  --   HGB 13.5 12.3* 12.3* 9.6* 8.5* 8.2*  HCT 39.0 35.6* 35.6* 28.3* 25.1* 24.0*  MCV 91.8 92.5 92.5  --  93.3 93.0  PLT 241 196 195  --  213 536   Basic Metabolic Panel: Recent Labs  Lab 02/13/19 1732 02/14/19 0535 02/15/19 0521  NA 139 139 139  K 3.8 3.8 3.7  CL 108 109 109  CO2 22 24 23   GLUCOSE 111* 112* 119*  BUN 30* 33* 22  CREATININE 1.01 0.92 0.85  CALCIUM 8.8* 8.3* 7.6*   GFR: Estimated Creatinine  Clearance: 69.1 mL/min (by C-G formula based on SCr of 0.85 mg/dL). Liver Function Tests: Recent Labs  Lab 02/13/19 1732  AST 28  ALT 25  ALKPHOS 49  BILITOT 0.6  PROT 6.2*  ALBUMIN 4.0   No results for input(s): LIPASE, AMYLASE in the last 168 hours. No results for input(s): AMMONIA in the last 168 hours. Coagulation Profile: No results for input(s): INR, PROTIME in the last 168 hours. Cardiac Enzymes: No results for input(s): CKTOTAL, CKMB, CKMBINDEX, TROPONINI in the last 168 hours. BNP (last 3 results) No results for input(s): PROBNP in the last 8760 hours. HbA1C: No results for input(s): HGBA1C in the last 72 hours. CBG: Recent Labs  Lab 02/14/19 1546 02/14/19 2042 02/15/19 0010 02/15/19 0445 02/15/19 0748  GLUCAP 87 84 123* 100* 100*   Lipid Profile: No results for input(s): CHOL, HDL, LDLCALC, TRIG, CHOLHDL, LDLDIRECT in the last 72 hours. Thyroid Function Tests: No results for input(s): TSH, T4TOTAL, FREET4, T3FREE, THYROIDAB in the last 72 hours. Anemia Panel: No results for input(s): VITAMINB12, FOLATE, FERRITIN, TIBC, IRON, RETICCTPCT in the last 72 hours. Urine analysis:    Component Value Date/Time   COLORURINE LT. YELLOW 05/22/2012 0824   APPEARANCEUR CLEAR 05/22/2012 0824   LABSPEC 1.025 05/22/2012 0824   PHURINE 6.0 05/22/2012 0824   GLUCOSEU NEGATIVE 05/22/2012 0824   HGBUR NEGATIVE 05/22/2012 0824   HGBUR negative 07/09/2009 0852   BILIRUBINUR n 09/21/2016 0925   KETONESUR NEGATIVE 05/22/2012 0824   PROTEINUR n 09/21/2016 0925   UROBILINOGEN 0.2 09/21/2016 0925   UROBILINOGEN 0.2 05/22/2012 0824   NITRITE n 09/21/2016 0925   NITRITE NEGATIVE 05/22/2012 0824   LEUKOCYTESUR Negative 09/21/2016 0925   Sepsis Labs: Invalid input(s): PROCALCITONIN, LACTICIDVEN  No results found for this or any previous visit (from the past 240 hour(s)).    Radiology Studies: No results found.   Marzetta Board, MD, PhD [facility]  Contact via   [email or web address]  [place] P: [phone]  F: [phone]

## 2019-02-16 ENCOUNTER — Encounter (HOSPITAL_COMMUNITY): Payer: Self-pay | Admitting: Interventional Radiology

## 2019-02-16 ENCOUNTER — Inpatient Hospital Stay (HOSPITAL_COMMUNITY): Payer: Medicare Other

## 2019-02-16 HISTORY — PX: IR US GUIDE VASC ACCESS RIGHT: IMG2390

## 2019-02-16 HISTORY — PX: IR ANGIOGRAM VISCERAL SELECTIVE: IMG657

## 2019-02-16 LAB — BASIC METABOLIC PANEL
Anion gap: 4 — ABNORMAL LOW (ref 5–15)
BUN: 12 mg/dL (ref 8–23)
CO2: 25 mmol/L (ref 22–32)
Calcium: 7.9 mg/dL — ABNORMAL LOW (ref 8.9–10.3)
Chloride: 113 mmol/L — ABNORMAL HIGH (ref 98–111)
Creatinine, Ser: 0.92 mg/dL (ref 0.61–1.24)
GFR calc Af Amer: >60 mL/min (ref >60)
GFR calc non Af Amer: >60 mL/min (ref >60)
Glucose, Bld: 112 mg/dL — ABNORMAL HIGH (ref 70–99)
Potassium: 3.8 mmol/L (ref 3.5–5.1)
SODIUM: 142 mmol/L (ref 135–145)

## 2019-02-16 LAB — CBC
HCT: 19.7 % — ABNORMAL LOW (ref 39.0–52.0)
HCT: 27.3 % — ABNORMAL LOW (ref 39.0–52.0)
Hemoglobin: 6.8 g/dL — CL (ref 13.0–17.0)
Hemoglobin: 9.3 g/dL — ABNORMAL LOW (ref 13.0–17.0)
MCH: 32.2 pg (ref 26.0–34.0)
MCH: 31.6 pg (ref 26.0–34.0)
MCHC: 34.5 g/dL (ref 30.0–36.0)
MCHC: 34.1 g/dL (ref 30.0–36.0)
MCV: 93.4 fL (ref 80.0–100.0)
MCV: 92.9 fL (ref 80.0–100.0)
NRBC: 0 % (ref 0.0–0.2)
NRBC: 0 % (ref 0.0–0.2)
Platelets: 159 10*3/uL (ref 150–400)
Platelets: 227 10*3/uL (ref 150–400)
RBC: 2.11 MIL/uL — ABNORMAL LOW (ref 4.22–5.81)
RBC: 2.94 MIL/uL — ABNORMAL LOW (ref 4.22–5.81)
RDW: 13.6 % (ref 11.5–15.5)
RDW: 13.7 % (ref 11.5–15.5)
WBC: 7 10*3/uL (ref 4.0–10.5)
WBC: 12.1 10*3/uL — AB (ref 4.0–10.5)

## 2019-02-16 LAB — PREPARE RBC (CROSSMATCH)

## 2019-02-16 LAB — GLUCOSE, CAPILLARY
GLUCOSE-CAPILLARY: 100 mg/dL — AB (ref 70–99)
Glucose-Capillary: 93 mg/dL (ref 70–99)

## 2019-02-16 MED ORDER — LIDOCAINE HCL 1 % IJ SOLN
INTRAMUSCULAR | Status: AC
  Filled 2019-02-16: qty 20

## 2019-02-16 MED ORDER — SODIUM CHLORIDE 0.9 % IV SOLN: INTRAVENOUS | Status: DC

## 2019-02-16 MED ORDER — LIDOCAINE HCL (PF) 1 % IJ SOLN
INTRAMUSCULAR | Status: DC | PRN
  Administered 2019-02-16 (×2): 5 mL

## 2019-02-16 MED ORDER — PEG 3350-KCL-NA BICARB-NACL 420 G PO SOLR
4000.0000 mL | Freq: Once | ORAL | Status: AC
  Administered 2019-02-16: 4000 mL via ORAL

## 2019-02-16 MED ORDER — MIDAZOLAM HCL 2 MG/2ML IJ SOLN
INTRAMUSCULAR | Status: DC | PRN
  Administered 2019-02-16: 1 mg via INTRAVENOUS

## 2019-02-16 MED ORDER — FENTANYL CITRATE (PF) 100 MCG/2ML IJ SOLN
INTRAMUSCULAR | Status: AC
  Filled 2019-02-16: qty 2

## 2019-02-16 MED ORDER — IOHEXOL 300 MG/ML  SOLN
100.0000 mL | Freq: Once | INTRAMUSCULAR | Status: AC | PRN
  Administered 2019-02-16: 40 mL via INTRA_ARTERIAL

## 2019-02-16 MED ORDER — IOHEXOL 300 MG/ML  SOLN: 100.0000 mL | Freq: Once | INTRAMUSCULAR | Status: DC | PRN

## 2019-02-16 MED ORDER — TECHNETIUM TC 99M-LABELED RED BLOOD CELLS IV KIT
22.8000 | PACK | Freq: Once | INTRAVENOUS | Status: AC | PRN
  Administered 2019-02-16: 22.8 via INTRAVENOUS

## 2019-02-16 MED ORDER — MIDAZOLAM HCL 2 MG/2ML IJ SOLN
INTRAMUSCULAR | Status: AC
  Filled 2019-02-16: qty 4

## 2019-02-16 MED ORDER — IOHEXOL 300 MG/ML  SOLN
100.0000 mL | Freq: Once | INTRAMUSCULAR | Status: AC | PRN
  Administered 2019-02-16: 24 mL via INTRA_ARTERIAL

## 2019-02-16 MED ORDER — SODIUM CHLORIDE 0.9% IV SOLUTION: Freq: Once | INTRAVENOUS | Status: DC

## 2019-02-16 MED ORDER — IOHEXOL 300 MG/ML  SOLN
100.0000 mL | Freq: Once | INTRAMUSCULAR | Status: AC | PRN
  Administered 2019-02-16: 32 mL via INTRA_ARTERIAL

## 2019-02-16 MED ORDER — FENTANYL CITRATE (PF) 100 MCG/2ML IJ SOLN
INTRAMUSCULAR | Status: DC | PRN
  Administered 2019-02-16: 50 ug via INTRAVENOUS

## 2019-02-16 NOTE — Procedures (Signed)
Pre-procedure Diagnosis: Acute lower GI bleed Post-procedure Diagnosis: Same  Post mesenteric arteriogram. No discrete areas of contrast extravasation or vessel irregularity.  As such, no embolization was performed.   Complications: None Immediate EBL: None  Keep right leg straight for 4 hrs.    Signed: Sandi Mariscal Pager: [phone] 02/16/2019, 2:52 PM

## 2019-02-16 NOTE — Progress Notes (Signed)
Patient educated on the importance of calling before getting up to bathroom by himself. Patient was educated numerous times during the previous and current shift on why he should have assistance getting up (low blood count and experiencing some episodes of dizziness). Pt continues to get up with assistance even though he has verbalized understanding the need for assistance. Will continue to monitor.

## 2019-02-16 NOTE — Consult Note (Signed)
Chief Complaint: Patient was seen in consultation today for GI bleed.  Referring Physician(s): Dr. Alessandra Bevels  Supervising Physician: Sandi Mariscal  Patient Status: [facility] - In-pt  History of Present Illness: David Goodman is a 78 y.o. male with a past medical history of significant for HTN, ETOH abuse, history of diverticulitis with diverticular bleed who presented to the ER on 02/13/19 with complaints frank rectal bleeding and hematochezia for ~ 3 days. He was admitted for further evaluation and management. He has been followed by GI since admission as well. He has had continued rectal bleeding and his hemoglobin today dropped to 6.8 from 7.8 yesterday. He underwent nuclear medicine scan today which was positive for GI bleed. Request has been made to IR for urgent mesenteric angiogram with possible embolization.  Patient states he feels fine, had a little headache earlier today which resolved with Tylenol. He is requesting to urinate before the procedure. He states understanding of procedure as previously discussed with him by Dr. Pascal Lux and wishes to proceed.   Past Medical History:  Diagnosis Date  . ALLERGIC RHINITIS 07/13/2007  . HYPERLIPIDEMIA 07/13/2007  . HYPERTENSION 07/13/2007  . 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 PEPTIC ULCER DISEASE 07/13/2007  . 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 ROTATOR CUFF REPAIR      Allergies: Lisinopril and Tramadol hcl  Medications: Prior to Admission medications   Medication Sig Start Date End Date Taking? Authorizing Provider  Ascorbic Acid (VITAMIN C PO) Take 1 tablet by mouth daily.   Yes [provider]  diphenhydrAMINE (BENADRYL) 25 MG tablet Take 25 mg by mouth daily as needed for allergies.   Yes [provider]  fluticasone (FLONASE) 50 MCG/ACT nasal spray USE 2 SPRAYS IN EACH  NOSTRIL DAILY Patient taking differently: USE 2 SPRAYS IN EACH  NOSTRIL DAILY AS  NEEDED FOR ALLERGIES 03/23/18  Yes Marletta Lor, MD  latanoprost (XALATAN) 0.005 % ophthalmic solution Place 1 drop into both eyes at bedtime. 06/20/17  Yes Marletta Lor, MD  losartan (COZAAR) 100 MG tablet TAKE 1 TABLET BY MOUTH  DAILY Patient taking differently: Take 100 mg by mouth daily.  02/20/18  Yes Marletta Lor, MD  MAGNESIUM PO Take 1 tablet by mouth daily.   Yes [provider]  naproxen (NAPROSYN) 500 MG tablet TAKE 1 TABLET BY MOUTH TWO  TIMES DAILY WITH MEAL(S) Patient taking differently: Take 500 mg by mouth 2 (two) times daily as needed (knee pain).  01/24/19  Yes Isaac Bliss, Rayford Halsted, MD  omeprazole (PRILOSEC) 20 MG capsule TAKE 1 CAPSULE BY MOUTH  DAILY Patient taking differently: Take 20 mg by mouth daily.  09/28/18  Yes Dorena Cookey, MD  psyllium (HYDROCIL/METAMUCIL) 95 % PACK Take 1 packet by mouth daily. 05/29/18  Yes Eugenie Filler, MD  tadalafil (CIALIS) 5 MG tablet Take 1 tablet (5 mg total) by mouth daily as needed for erectile dysfunction. 05/03/14  Yes Marletta Lor, MD  Testosterone Cypionate 100 MG/ML SOLN Inject 100 mg as directed every 14 (fourteen) days. 12/05/18  Yes Isaac Bliss, Rayford Halsted, MD  levocetirizine (XYZAL) 5 MG tablet TAKE 1 TABLET BY MOUTH  EVERY EVENING Patient not taking: Reported on 01/18/2019 12/30/17   Marletta Lor, MD     History reviewed. No pertinent family history.  Social History   Socioeconomic History  . Marital status: Married    Spouse name: Not on file  .  Number of children: Not on file  . Years of education: Not on file  . Highest education level: Not on file  Occupational History  . Not on file  Social Needs  . Financial resource strain: Not on file  . Food insecurity:    Worry: Not on file    Inability: Not on file  . Transportation needs:    Medical: Not on file    Non-medical: Not on file  Tobacco Use  . Smoking status: Never Smoker  . Smokeless tobacco: Never  Used  Substance and Sexual Activity  . Alcohol use: Yes    Comment: couple times a week - wine  . Drug use: No  . Sexual activity: Not Currently  Lifestyle  . Physical activity:    Days per week: Not on file    Minutes per session: Not on file  . Stress: Not on file  Relationships  . Social connections:    Talks on phone: Not on file    Gets together: Not on file    Attends religious service: Not on file    Active member of club or organization: Not on file    Attends meetings of clubs or organizations: Not on file    Relationship status: Not on file  Other Topics Concern  . Not on file  Social History Narrative  . Not on file     Review of Systems: A 12 point ROS discussed and pertinent positives are indicated in the HPI above.  All other systems are negative.  Review of Systems  Constitutional: Negative for chills, fatigue and fever.  Respiratory: Negative for cough and shortness of breath.   Cardiovascular: Negative for chest pain.  Gastrointestinal: Positive for blood in stool. Negative for abdominal pain, diarrhea, nausea and vomiting.  Musculoskeletal: Negative for back pain.  Skin: Negative for color change.  Neurological: Positive for headaches (earlier today - resolved with Tylenol). Negative for dizziness, syncope and weakness.  Psychiatric/Behavioral: Negative for confusion.    Vital Signs: BP 138/68 (BP Location: Right Arm)   Pulse 86   Temp 98.4 F (36.9 C) (Oral)   Resp (!) 22   Ht 5' 3.75" (1.619 m)   Wt 176 lb (79.8 kg)   SpO2 100%   BMI 30.45 kg/m   Physical Exam Vitals signs and nursing note reviewed.  Constitutional:      General: He is not in acute distress.    Comments: Sitting up in hospital bed, asking to use the restroom. Pleasant, talkative, good historian.  HENT:     Head: Normocephalic.  Cardiovascular:     Rate and Rhythm: Normal rate and regular rhythm.  Pulmonary:     Effort: Pulmonary effort is normal.     Breath sounds:  Normal breath sounds.  Abdominal:     General: There is no distension.     Palpations: Abdomen is soft.     Tenderness: There is no abdominal tenderness.  Skin:    General: Skin is warm and dry.  Neurological:     Mental Status: He is alert and oriented to person, place, and time.  Psychiatric:        Mood and Affect: Mood normal.        Behavior: Behavior normal.        Thought Content: Thought content normal.        Judgment: Judgment normal.      MD Evaluation Airway: WNL Heart: WNL Abdomen: WNL Chest/ Lungs: WNL ASA  Classification: 3  Mallampati/Airway Score: Two   Imaging: No results found.  Labs:  CBC: Recent Labs    02/14/19 2254 02/15/19 0521 02/15/19 1600 02/16/19 0529  WBC 10.1 8.5 6.2 7.0  HGB 8.5* 8.2* 7.8* 6.8*  HCT 25.1* 24.0* 24.0* 19.7*  PLT 213 168 178 159    COAGS: No results for input(s): INR, APTT in the last 8760 hours.  BMP: Recent Labs    02/13/19 1732 02/14/19 0535 02/15/19 0521 02/16/19 0529  NA 139 139 139 142  K 3.8 3.8 3.7 3.8  CL 108 109 109 113*  CO2 22 24 23 25   GLUCOSE 111* 112* 119* 112*  BUN 30* 33* 22 12  CALCIUM 8.8* 8.3* 7.6* 7.9*  CREATININE 1.01 0.92 0.85 0.92  GFRNONAA >60 >60 >60 >60  GFRAA >60 >60 >60 >60    LIVER FUNCTION TESTS: Recent Labs    05/24/18 1149 10/31/18 0820 02/13/19 1732  BILITOT 0.3 0.5 0.6  AST 17 18 28   ALT 12 20 25   ALKPHOS 49 51 49  PROT 6.3* 6.4 6.2*  ALBUMIN 3.7 4.3 4.0    TUMOR MARKERS: No results for input(s): AFPTM, CEA, CA199, CHROMGRNA in the last 8760 hours.  Assessment and Plan:  78 y/o M admitted for hematochezia and rectal bleeding for 3 days prior. More bleeding overnight with drop in hemoglobin to 6.8 from 7.8 yesterday. Nuclear medicine study (+) for GIB - will plan for mesenteric angiogram with possible embolization today in IR.  Patient has been NPO since 5 pm yesterday, he does not currently take any blood thinning medications. Afebrile, WBC 7.0, hgb  6.8, plt 159, no INR on file.  Risks and benefits of mesenteric angiogram with possible intervention were discussed with the patient including, but not limited to bleeding, infection, vascular injury, contrast induced renal failure, stroke or even death.  This interventional procedure involves the use of X-rays and because of the nature of the planned procedure, it is possible that we will have prolonged use of X-ray fluoroscopy.  Potential radiation risks to you include (but are not limited to) the following: - A slightly elevated risk for cancer  several years later in life. This risk is typically less than 0.5% percent. This risk is low in comparison to the normal incidence of human cancer, which is 33% for women and 50% for men according to the [place]. - Radiation induced injury can include skin redness, resembling a rash, tissue breakdown / ulcers and hair loss (which can be temporary or permanent).  The likelihood of either of these occurring depends on the difficulty of the procedure and whether you are sensitive to radiation due to previous procedures, disease, or genetic conditions.  IF your procedure requires a prolonged use of radiation, you will be notified and given written instructions for further action.  It is your responsibility to monitor the irradiated area for the 2 weeks following the procedure and to notify your physician if you are concerned that you have suffered a radiation induced injury.    All of the patient's questions were answered, patient is agreeable to proceed.  Consent signed and in chart.   Thank you for this interesting consult.  I greatly enjoyed meeting Benson Porcaro and look forward to participating in their care.  A copy of this report was sent to the requesting provider on this date.  Electronically Signed: Joaquim Nam, PA-C 02/16/2019, 12:33 PM   I spent a total of 40 Minutes  in face to face  in clinical consultation, greater  than 50% of which was counseling/coordinating care for GI bleed.

## 2019-02-16 NOTE — Progress Notes (Signed)
[facility] Gastroenterology Progress Note  David Goodman 78 y.o. [birth year]  CC: Lower GI bleed   Subjective: Continues to have rectal bleeding.  Hemoglobin dropped to 6.8.  Bleeding scan ordered.  Patient denies abdominal pain, nausea vomiting.  ROS : Negative for chest pain shortness of breath.  Positive for weakness.  Positive for dizziness.   Objective: Vital signs in last 24 hours: Vitals:   02/16/19 0804 02/16/19 0807  BP: (!) 121/58 138/68  Pulse: 74 86  Resp:    Temp: 98.3 F (36.8 C) 98.4 F (36.9 C)  SpO2: 100% 100%    Physical Exam:  General:  Alert, cooperative, no distress, appears stated age  Head:  Normocephalic, without obvious abnormality, atraumatic  Eyes:  , EOM's intact,   Lungs:   Clear to auscultation bilaterally, respirations unlabored  Heart:  Regular rate and rhythm, S1, S2 normal  Abdomen:   Soft, non-tender, nondistended, bowel sounds present, no peritoneal signs  Extremities: Extremities normal, atraumatic, no  edema  Pulses: 2+ and symmetric    Lab Results: Recent Labs    02/15/19 0521 02/16/19 0529  NA 139 142  K 3.7 3.8  CL 109 113*  CO2 23 25  GLUCOSE 119* 112*  BUN 22 12  CREATININE 0.85 0.92  CALCIUM 7.6* 7.9*   Recent Labs    02/13/19 1732  AST 28  ALT 25  ALKPHOS 49  BILITOT 0.6  PROT 6.2*  ALBUMIN 4.0   Recent Labs    02/14/19 2254  02/15/19 1600 02/16/19 0529  WBC 10.1   < > 6.2 7.0  NEUTROABS 5.9  --   --   --   HGB 8.5*   < > 7.8* 6.8*  HCT 25.1*   < > 24.0* 19.7*  MCV 93.3   < > 94.9 93.4  PLT 213   < > 178 159   < > = values in this interval not displayed.   No results for input(s): LABPROT, INR in the last 72 hours.    Assessment/Plan: -Rectal bleeding.  Most likely diverticular   Colonoscopy in October 2019 showed few small polyp, diverticulosis and hemorrhoids. - blood loss anemia.  Hgb 6.8   Recommendations -------------------------- -Bleeding scan ordered.  If positive, recommend  interventional radiology consult for embolization.  If negative, he will need colonoscopy tomorrow. -Utility of flexible sigmoidoscopy uncertain because bleeding scan in June 2019 showed bleeding from transverse colon. - transfuse to  keep hemoglobin around 8. -Recurrent nature of  diverticular bleed  again discussed with the patient. -GI will follow   Otis Brace MD, [place] 02/16/2019, 8:49 AM  Contact #  [phone]

## 2019-02-16 NOTE — Progress Notes (Signed)
PROGRESS NOTE  Wilkie Zenon YTK:[id number] DOB: Oct 31, 1941 DOA: 02/13/2019 PCP: Isaac Bliss, Rayford Halsted, MD   LOS: 1 day   Brief Narrative / Interim history: David Goodman is a 78 y.o. male with history of hypertension, alcohol abuse who was admitted in June 2019 for diverticular bleed at the time patient also had diverticulitis presents to the ER with complaints of having passed frank blood 3 times prior to coming.  Patient started having frank rectal bleeding since afternoon today.  Denies any abdominal pain nausea or vomiting.  Takes naproxen.  Denies taking aspirin.  Denies chest pain or shortness of breath.  Subjective: Continues to have bleeding, tagged RBC scan pending this morning.  He is otherwise feeling well, no lightheadedness or dizziness  Assessment & Plan: Principal Problem:   Acute GI bleeding Active Problems:   Essential hypertension   Principal Problem Acute blood loss anemia due to GI bleed, probably diverticular -gastroenterology following, appreciate input.  Had more bleeding overnight and hemoglobin is 6.8 this morning.  Transfuse 1 unit of packed red blood cells and patient is to get a tagged RBC scan -Continue Protonix  Active Problems Hypertension -Blood pressure has remained stable, hold home medications  History of alcohol abuse -Not triggering CIWA   Scheduled Meds: . sodium chloride   Intravenous Once  . folic acid  1 mg Oral Daily  . hydrocortisone  25 mg Rectal BID  . latanoprost  1 drop Both Eyes QHS  . multivitamin with minerals  1 tablet Oral Daily  . pantoprazole  40 mg Oral Daily  . thiamine  100 mg Oral Daily   Or  . thiamine  100 mg Intravenous Daily   Continuous Infusions: . sodium chloride 100 mL/hr at 02/15/19 2355   PRN Meds:.acetaminophen **OR** acetaminophen, hydrALAZINE, LORazepam **OR** LORazepam, ondansetron **OR** ondansetron (ZOFRAN) IV, polyvinyl alcohol   DVT prophylaxis: SCDs Code Status: Full code Family  Communication: no family at bedside  Disposition Plan: home when ready   Consultants:   GI  Procedures:   None   Antimicrobials:  None    Objective: Vitals:   02/16/19 0312 02/16/19 0800 02/16/19 0804 02/16/19 0807  BP: 119/66 131/66 (!) 121/58 138/68  Pulse: 95 88 74 86  Resp: (!) 22     Temp: 97.8 F (36.6 C) 98.8 F (37.1 C) 98.3 F (36.8 C) 98.4 F (36.9 C)  TempSrc: Oral Oral Oral Oral  SpO2: 99% 100% 100% 100%  Weight:      Height:       No intake or output data in the 24 hours ending 02/16/19 1144 Filed Weights   02/13/19 1729  Weight: 79.8 kg    Examination:  Constitutional: NAD, pale appearing Eyes: no icterus ENMT: mmm Neck: Normal, supple Respiratory: CTA biL Cardiovascular: Regular rate and rhythm, no murmurs appreciated Abdomen: Soft, NT, ND, positive bowel sounds Musculoskeletal: no clubbing / cyanosis.  Skin: No rashes seen Neurologic: Nonfocal, equal strength   Data Reviewed: I have independently reviewed following labs and imaging studies   CBC: Recent Labs  Lab 02/14/19 0535 02/14/19 1814 02/14/19 2254 02/15/19 0521 02/15/19 1600 02/16/19 0529  WBC 7.2  --  10.1 8.5 6.2 7.0  NEUTROABS  --   --  5.9  --   --   --   HGB 12.3* 9.6* 8.5* 8.2* 7.8* 6.8*  HCT 35.6* 28.3* 25.1* 24.0* 24.0* 19.7*  MCV 92.5  --  93.3 93.0 94.9 93.4  PLT 195  --  213 168 178  295   Basic Metabolic Panel: Recent Labs  Lab 02/13/19 1732 02/14/19 0535 02/15/19 0521 02/16/19 0529  NA 139 139 139 142  K 3.8 3.8 3.7 3.8  CL 108 109 109 113*  CO2 22 24 23 25   GLUCOSE 111* 112* 119* 112*  BUN 30* 33* 22 12  CREATININE 1.01 0.92 0.85 0.92  CALCIUM 8.8* 8.3* 7.6* 7.9*   GFR: Estimated Creatinine Clearance: 63.8 mL/min (by C-G formula based on SCr of 0.92 mg/dL). Liver Function Tests: Recent Labs  Lab 02/13/19 1732  AST 28  ALT 25  ALKPHOS 49  BILITOT 0.6  PROT 6.2*  ALBUMIN 4.0   No results for input(s): LIPASE, AMYLASE in the last 168  hours. No results for input(s): AMMONIA in the last 168 hours. Coagulation Profile: No results for input(s): INR, PROTIME in the last 168 hours. Cardiac Enzymes: No results for input(s): CKTOTAL, CKMB, CKMBINDEX, TROPONINI in the last 168 hours. BNP (last 3 results) No results for input(s): PROBNP in the last 8760 hours. HbA1C: No results for input(s): HGBA1C in the last 72 hours. CBG: Recent Labs  Lab 02/15/19 0748 02/15/19 1302 02/15/19 1603 02/15/19 2022 02/16/19 0757  GLUCAP 100* 105* 89 113* 100*   Lipid Profile: No results for input(s): CHOL, HDL, LDLCALC, TRIG, CHOLHDL, LDLDIRECT in the last 72 hours. Thyroid Function Tests: No results for input(s): TSH, T4TOTAL, FREET4, T3FREE, THYROIDAB in the last 72 hours. Anemia Panel: No results for input(s): VITAMINB12, FOLATE, FERRITIN, TIBC, IRON, RETICCTPCT in the last 72 hours. Urine analysis:    Component Value Date/Time   COLORURINE LT. YELLOW 05/22/2012 0824   APPEARANCEUR CLEAR 05/22/2012 0824   LABSPEC 1.025 05/22/2012 0824   PHURINE 6.0 05/22/2012 0824   GLUCOSEU NEGATIVE 05/22/2012 0824   HGBUR NEGATIVE 05/22/2012 0824   HGBUR negative 07/09/2009 0852   BILIRUBINUR n 09/21/2016 0925   KETONESUR NEGATIVE 05/22/2012 0824   PROTEINUR n 09/21/2016 0925   UROBILINOGEN 0.2 09/21/2016 0925   UROBILINOGEN 0.2 05/22/2012 0824   NITRITE n 09/21/2016 0925   NITRITE NEGATIVE 05/22/2012 0824   LEUKOCYTESUR Negative 09/21/2016 0925   Sepsis Labs: Invalid input(s): PROCALCITONIN, LACTICIDVEN  No results found for this or any previous visit (from the past 240 hour(s)).    Radiology Studies: No results found.   Marzetta Board, MD, PhD [facility]  Contact via  [email or web address]  [place] P: [phone]  F: [phone]

## 2019-02-16 NOTE — H&P (View-Only) (Signed)
[facility] Gastroenterology Progress Note  David Goodman 78 y.o. [birth year]  CC: Lower GI bleed   Subjective: Continues to have rectal bleeding.  Hemoglobin dropped to 6.8.  Bleeding scan ordered.  Patient denies abdominal pain, nausea vomiting.  ROS : Negative for chest pain shortness of breath.  Positive for weakness.  Positive for dizziness.   Objective: Vital signs in last 24 hours: Vitals:   02/16/19 0804 02/16/19 0807  BP: (!) 121/58 138/68  Pulse: 74 86  Resp:    Temp: 98.3 F (36.8 C) 98.4 F (36.9 C)  SpO2: 100% 100%    Physical Exam:  General:  Alert, cooperative, no distress, appears stated age  Head:  Normocephalic, without obvious abnormality, atraumatic  Eyes:  , EOM's intact,   Lungs:   Clear to auscultation bilaterally, respirations unlabored  Heart:  Regular rate and rhythm, S1, S2 normal  Abdomen:   Soft, non-tender, nondistended, bowel sounds present, no peritoneal signs  Extremities: Extremities normal, atraumatic, no  edema  Pulses: 2+ and symmetric    Lab Results: Recent Labs    02/15/19 0521 02/16/19 0529  NA 139 142  K 3.7 3.8  CL 109 113*  CO2 23 25  GLUCOSE 119* 112*  BUN 22 12  CREATININE 0.85 0.92  CALCIUM 7.6* 7.9*   Recent Labs    02/13/19 1732  AST 28  ALT 25  ALKPHOS 49  BILITOT 0.6  PROT 6.2*  ALBUMIN 4.0   Recent Labs    02/14/19 2254  02/15/19 1600 02/16/19 0529  WBC 10.1   < > 6.2 7.0  NEUTROABS 5.9  --   --   --   HGB 8.5*   < > 7.8* 6.8*  HCT 25.1*   < > 24.0* 19.7*  MCV 93.3   < > 94.9 93.4  PLT 213   < > 178 159   < > = values in this interval not displayed.   No results for input(s): LABPROT, INR in the last 72 hours.    Assessment/Plan: -Rectal bleeding.  Most likely diverticular   Colonoscopy in October 2019 showed few small polyp, diverticulosis and hemorrhoids. - blood loss anemia.  Hgb 6.8   Recommendations -------------------------- -Bleeding scan ordered.  If positive, recommend  interventional radiology consult for embolization.  If negative, he will need colonoscopy tomorrow. -Utility of flexible sigmoidoscopy uncertain because bleeding scan in June 2019 showed bleeding from transverse colon. - transfuse to  keep hemoglobin around 8. -Recurrent nature of  diverticular bleed  again discussed with the patient. -GI will follow   Otis Brace MD, [place] 02/16/2019, 8:49 AM  Contact #  [phone]

## 2019-02-16 NOTE — Progress Notes (Signed)
CRITICAL VALUE ALERT  Critical Value:  Hemoglobin 6.8  Date & Time Notied:  [id number]  Provider Notified: yes  Orders Received/Actions taken: yes

## 2019-02-16 NOTE — Progress Notes (Signed)
This RN called into pts room at [name] regarding frank red blood in stool.  Dr. Paulita Fujita paged and notified of 3 episodes at time of this writing.   Pt has no complaints, no issues of pain, weakness or dizziness. Dr advised me to place order for bleeding scan for the a.m. Will continue to monitor

## 2019-02-17 ENCOUNTER — Inpatient Hospital Stay (HOSPITAL_COMMUNITY): Payer: Medicare Other | Admitting: Anesthesiology

## 2019-02-17 ENCOUNTER — Encounter (HOSPITAL_COMMUNITY)
Admission: EM | Disposition: A | Discharge status: Home / Self Care | Payer: Self-pay | Source: Home / Self Care | Attending: Internal Medicine

## 2019-02-17 ENCOUNTER — Encounter (HOSPITAL_COMMUNITY): Payer: Self-pay | Admitting: Anesthesiology

## 2019-02-17 DIAGNOSIS — R58 Hemorrhage, not elsewhere classified: Secondary | ICD-10-CM

## 2019-02-17 HISTORY — PX: COLONOSCOPY WITH PROPOFOL: SHX5780

## 2019-02-17 LAB — TYPE AND SCREEN
ABO/RH(D): A POS
Antibody Screen: NEGATIVE
Unit division: 0

## 2019-02-17 LAB — BASIC METABOLIC PANEL
Anion gap: 7 (ref 5–15)
BUN: 14 mg/dL (ref 8–23)
CO2: 23 mmol/L (ref 22–32)
Calcium: 7.7 mg/dL — ABNORMAL LOW (ref 8.9–10.3)
Chloride: 109 mmol/L (ref 98–111)
Creatinine, Ser: 0.92 mg/dL (ref 0.61–1.24)
GFR calc Af Amer: >60 mL/min (ref >60)
GFR calc non Af Amer: >60 mL/min (ref >60)
Glucose, Bld: 100 mg/dL — ABNORMAL HIGH (ref 70–99)
Potassium: 3.5 mmol/L (ref 3.5–5.1)
Sodium: 139 mmol/L (ref 135–145)

## 2019-02-17 LAB — BPAM RBC
BLOOD PRODUCT EXPIRATION DATE: 202004112359
ISSUE DATE / TIME: 202003201424
Unit Type and Rh: 6200

## 2019-02-17 LAB — CBC
HEMATOCRIT: 20.2 % — AB (ref 39.0–52.0)
HEMOGLOBIN: 6.8 g/dL — AB (ref 13.0–17.0)
MCH: 31.6 pg (ref 26.0–34.0)
MCHC: 33.7 g/dL (ref 30.0–36.0)
MCV: 94 fL (ref 80.0–100.0)
Platelets: 160 10*3/uL (ref 150–400)
RBC: 2.15 MIL/uL — ABNORMAL LOW (ref 4.22–5.81)
RDW: 14 % (ref 11.5–15.5)
WBC: 8.2 10*3/uL (ref 4.0–10.5)
nRBC: 0 % (ref 0.0–0.2)

## 2019-02-17 LAB — GLUCOSE, CAPILLARY
Glucose-Capillary: 103 mg/dL — ABNORMAL HIGH (ref 70–99)
Glucose-Capillary: 133 mg/dL — ABNORMAL HIGH (ref 70–99)
Glucose-Capillary: 88 mg/dL (ref 70–99)
Glucose-Capillary: 92 mg/dL (ref 70–99)

## 2019-02-17 LAB — PREPARE RBC (CROSSMATCH)

## 2019-02-17 SURGERY — COLONOSCOPY WITH PROPOFOL
Anesthesia: Monitor Anesthesia Care

## 2019-02-17 MED ORDER — PROPOFOL 500 MG/50ML IV EMUL
INTRAVENOUS | Status: DC | PRN
  Administered 2019-02-17: 10 mg via INTRAVENOUS
  Administered 2019-02-17: 30 mg via INTRAVENOUS
  Administered 2019-02-17 (×2): 10 mg via INTRAVENOUS

## 2019-02-17 MED ORDER — PROPOFOL 500 MG/50ML IV EMUL
INTRAVENOUS | Status: DC | PRN
  Administered 2019-02-17: 100 ug/kg/min via INTRAVENOUS

## 2019-02-17 MED ORDER — SODIUM CHLORIDE 0.9% IV SOLUTION
Freq: Once | INTRAVENOUS | Status: AC
  Administered 2019-02-17: 10:00:00 via INTRAVENOUS

## 2019-02-17 MED ORDER — PROPOFOL 10 MG/ML IV BOLUS
INTRAVENOUS | Status: AC
  Filled 2019-02-17: qty 20

## 2019-02-17 MED ORDER — PROPOFOL 10 MG/ML IV BOLUS
INTRAVENOUS | Status: AC
  Filled 2019-02-17: qty 40

## 2019-02-17 SURGICAL SUPPLY — 22 items

## 2019-02-17 NOTE — Brief Op Note (Signed)
02/13/2019 - 02/17/2019  12:35 PM  PATIENT:  David Goodman  78 y.o. 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   Delay start of Pharmacological VTE agent (>24hrs) due to surgical blood loss or risk of bleeding: yes

## 2019-02-17 NOTE — Anesthesia Procedure Notes (Signed)
Date/Time: 02/17/2019 11:55 AM Performed by: Glory Buff, CRNA Oxygen Delivery Method: Simple face mask

## 2019-02-17 NOTE — Anesthesia Preprocedure Evaluation (Addendum)
Anesthesia Evaluation  Patient identified by MRN, date of birth, ID band Patient awake    Reviewed: Allergy & Precautions, NPO status , Patient's Chart, lab work & pertinent test results  Airway Mallampati: II  TM Distance: >3 FB Neck ROM: Full    Dental  (+) Upper Dentures   Pulmonary neg pulmonary ROS,    breath sounds clear to auscultation       Cardiovascular hypertension,  Rhythm:Regular Rate:Normal     Neuro/Psych negative neurological ROS     GI/Hepatic Neg liver ROS, PUD, GERD  ,  Endo/Other  negative endocrine ROS  Renal/GU negative Renal ROS     Musculoskeletal negative musculoskeletal ROS (+)   Abdominal Normal abdominal exam  (+)   Peds  Hematology negative hematology ROS (+)   Anesthesia Other Findings   Reproductive/Obstetrics                            Lab Results  Component Value Date   WBC 8.2 02/17/2019   HGB 6.8 (LL) 02/17/2019   HCT 20.2 (L) 02/17/2019   MCV 94.0 02/17/2019   PLT 160 02/17/2019     Anesthesia Physical Anesthesia Plan  ASA: III and emergent  Anesthesia Plan: MAC   Post-op Pain Management:    Induction: Intravenous  PONV Risk Score and Plan: 0 and Propofol infusion  Airway Management Planned: Simple Face Mask and Natural Airway  Additional Equipment: None  Intra-op Plan:   Post-operative Plan:   Informed Consent: I have reviewed the patients History and Physical, chart, labs and discussed the procedure including the risks, benefits and alternatives for the proposed anesthesia with the patient or authorized representative who has indicated his/her understanding and acceptance.       Plan Discussed with: CRNA  Anesthesia Plan Comments:        Anesthesia Quick Evaluation

## 2019-02-17 NOTE — Op Note (Signed)
[facility] Patient Name: David Goodman Procedure Date: 02/17/2019 MRN: [id number] Attending MD: Ronnette Juniper , MD Date of Birth: 11-Nov-1941 CSN: [id number] Age: 78 Admit Type: Inpatient Procedure:                Colonoscopy Indications:              Hematochezia Providers:                Ronnette Juniper, MD, Elna Breslow, RN, Elspeth Cho                            Tech., Technician, Rosario Adie, CRNA Referring MD:              Medicines:                Monitored Anesthesia Care Complications:            No immediate complications. Estimated Blood Loss:     Estimated blood loss: none. Procedure:                Pre-Anesthesia Assessment:                           - Prior to the procedure, a History and Physical                            was performed, and patient medications and                            allergies were reviewed. The patient's tolerance of                            previous anesthesia was also reviewed. The risks                            and benefits of the procedure and the sedation                            options and risks were discussed with the patient.                            All questions were answered, and informed consent                            was obtained. Prior Anticoagulants: The patient has                            taken no previous anticoagulant or antiplatelet                            agents. ASA Grade Assessment: III - A patient with                            severe systemic disease. After reviewing the risks  and benefits, the patient was deemed in                            satisfactory condition to undergo the procedure.                           After obtaining informed consent, the colonoscope                            was passed under direct vision. Throughout the                            procedure, the patient's blood pressure, pulse, and                            oxygen  saturations were monitored continuously. The                            [id number]-H190DL ([id number] Olympus pediatric colonscope                            was introduced through the anus and advanced to the                            the cecum, identified by appendiceal orifice and                            ileocecal valve. The colonoscopy was performed                            without difficulty. The patient tolerated the                            procedure well. The quality of the bowel                            preparation was fair. Scope In: 12:02:05 PM Scope Out: 12:30:40 PM Scope Withdrawal Time: 0 hours 24 minutes 16 seconds  Total Procedure Duration: 0 hours 28 minutes 35 seconds  Findings:      The perianal and digital rectal examinations were normal.      Multiple small and large-mouthed diverticula were found in the sigmoid       colon, descending colon, splenic flexure, transverse colon, hepatic       flexure and ascending colon.      Evidence of recent but not active bleeding was found in the entire colon.      A small amount of yellow brown colored liquid stool was found in the       ascending colon, in the cecum and at the appendiceal orifice.      Copious amount of lavage of the enitre colon, specially in the areas of       diverticulosis was performed, resulting in clearance with fair       visualization.      The retroflexed view of the distal rectum and anal verge was normal and  showed no anal or rectal abnormalities. Impression:               - Preparation of the colon was fair.                           - Diverticulosis in the sigmoid colon, in the                            descending colon, at the splenic flexure, in the                            transverse colon, at the hepatic flexure and in the                            ascending colon.                           - Old blood in the entire examined colon. Evidence                            of recent but  not active bleeding noted.                           - Liquid yellow brown stool in the ascending colon,                            in the cecum and at the appendiceal orifice.                           - The distal rectum and anal verge are normal on                            retroflexion view.                           - No specimens collected. Moderate Sedation:      Patient did not receive moderate sedation for this procedure, but       instead received monitored anesthesia care. Recommendation:           - Clear liquid diet.                           - Monitor H and H and transfuse as needed.                           - Evidence of recent but not active bleeding noted,                            most likely diverticular in origin. Procedure Code(s):        --- Professional ---                           [phone], Colonoscopy, flexible; diagnostic, including  collection of specimen(s) by brushing or washing,                            when performed (separate procedure) Diagnosis Code(s):        --- Professional ---                           K92.2, Gastrointestinal hemorrhage, unspecified                           K92.1, Melena (includes Hematochezia)                           K57.30, Diverticulosis of large intestine without                            perforation or abscess without bleeding CPT copyright 2018 [employer]. All rights reserved. The codes documented in this report are preliminary and upon coder review may  be revised to meet current compliance requirements. Ronnette Juniper, MD 02/17/2019 12:43:19 PM This report has been signed electronically. Number of Addenda: 0

## 2019-02-17 NOTE — Interval H&P Note (Signed)
History and Physical Interval Note: 77/male with continued hematochezia for a colonoscopy.  02/17/2019 11:36 AM  David Goodman  has presented today for hematochezia, with the diagnosis of Lower GI bleed.  The various methods of treatment have been discussed with the patient and family. After consideration of risks, benefits and other options for treatment, the patient has consented to  Procedure(s): COLONOSCOPY WITH PROPOFOL (N/A) as a surgical intervention.  The patient's history has been reviewed, patient examined, no change in status, stable for surgery.  I have reviewed the patient's chart and labs.  Questions were answered to the patient's satisfaction.     Ronnette Juniper

## 2019-02-17 NOTE — Progress Notes (Signed)
PROGRESS NOTE  David Goodman LDJ:[id number] DOB: 08/20/1941 DOA: 02/13/2019 PCP: Isaac Bliss, Rayford Halsted, MD   LOS: 2 days   Brief Narrative / Interim history: David Goodman is a 78 y.o. male with history of hypertension, alcohol abuse who was admitted in June 2019 for diverticular bleed at the time patient also had diverticulitis presents to the ER with complaints of having passed frank blood 3 times prior to coming.  Patient started having frank rectal bleeding since afternoon today.  Denies any abdominal pain nausea or vomiting.  Takes naproxen.  Denies taking aspirin.  Denies chest pain or shortness of breath.  Subjective: Had a rough night, underwent bowel prep.  Continues to have bleeding.  Assessment & Plan: Principal Problem:   Acute GI bleeding Active Problems:   Essential hypertension   Principal Problem Acute blood loss anemia due to GI bleed, probably diverticular -gastroenterology following, appreciate input.  Patient has had intermittent bleeding, hemoglobin 6.8 on 3/20 required a unit of packed red blood cells, blood more overnight and hemoglobin again 6.8 on 3/21.  Transfuse another unit today -Tagged RBC scan was positive for bleed, this was followed by IR however no bleeding could be identified in interventional radiology. -Plan for colonoscopy today   Active Problems Hypertension -Blood pressure has remained stable, hold home medications  History of alcohol abuse -Not triggering CIWA   Scheduled Meds:  sodium chloride   Intravenous Once   folic acid  1 mg Oral Daily   hydrocortisone  25 mg Rectal BID   latanoprost  1 drop Both Eyes QHS   multivitamin with minerals  1 tablet Oral Daily   pantoprazole  40 mg Oral Daily   thiamine  100 mg Oral Daily   Or   thiamine  100 mg Intravenous Daily   Continuous Infusions:  sodium chloride 100 mL/hr at 02/15/19 2355   sodium chloride     PRN Meds:.acetaminophen **OR** acetaminophen, fentaNYL,  hydrALAZINE, iohexol, lidocaine (PF), midazolam, ondansetron **OR** ondansetron (ZOFRAN) IV, polyvinyl alcohol   DVT prophylaxis: SCDs Code Status: Full code Family Communication: no family at bedside  Disposition Plan: home when ready   Consultants:   GI  Procedures:   None   Antimicrobials:  None    Objective: Vitals:   02/16/19 2056 02/17/19 0412 02/17/19 0957 02/17/19 1012  BP: (!) 159/82 136/77 129/71 123/64  Pulse: 88 78 82 76  Resp: '16 16 16 16  '$ Temp: 97.7 F (36.5 C) (!) 97.4 F (36.3 C) 97.7 F (36.5 C) 97.8 F (36.6 C)  TempSrc: Oral  Oral Oral  SpO2: 99% 100% 99% 100%  Weight:      Height:        Intake/Output Summary (Last 24 hours) at 02/17/2019 1052 Last data filed at 02/16/2019 1737 Gross per 24 hour  Intake 350 ml  Output 360 ml  Net -10 ml   Filed Weights   02/13/19 1729  Weight: 79.8 kg    Examination:  Constitutional: No distress, Eyes: No scleral icterus seen ENMT: Moist mucous membranes Neck: Normal, supple Respiratory: Lungs are clear to auscultation without wheezing or crackles Cardiovascular: Regular rate and rhythm, no murmurs Abdomen: Soft, nontender, nondistended, positive bowel sounds Musculoskeletal: no clubbing / cyanosis.  Skin: No rashes seen Neurologic: No focal deficits, ambulatory   Data Reviewed: I have independently reviewed following labs and imaging studies   CBC: Recent Labs  Lab 02/14/19 2254 02/15/19 0521 02/15/19 1600 02/16/19 0529 02/16/19 1945 02/17/19 0544  WBC 10.1 8.5 6.2  7.0 12.1* 8.2  NEUTROABS 5.9  --   --   --   --   --   HGB 8.5* 8.2* 7.8* 6.8* 9.3* 6.8*  HCT 25.1* 24.0* 24.0* 19.7* 27.3* 20.2*  MCV 93.3 93.0 94.9 93.4 92.9 94.0  PLT 213 168 178 159 227 626   Basic Metabolic Panel: Recent Labs  Lab 02/13/19 1732 02/14/19 0535 02/15/19 0521 02/16/19 0529 02/17/19 0544  NA 139 139 139 142 139  K 3.8 3.8 3.7 3.8 3.5  CL 108 109 109 113* 109  CO2 '22 24 23 25 23  '$ GLUCOSE 111*  112* 119* 112* 100*  BUN 30* 33* '22 12 14  '$ CREATININE 1.01 0.92 0.85 0.92 0.92  CALCIUM 8.8* 8.3* 7.6* 7.9* 7.7*   GFR: Estimated Creatinine Clearance: 63.8 mL/min (by C-G formula based on SCr of 0.92 mg/dL). Liver Function Tests: Recent Labs  Lab 02/13/19 1732  AST 28  ALT 25  ALKPHOS 49  BILITOT 0.6  PROT 6.2*  ALBUMIN 4.0   No results for input(s): LIPASE, AMYLASE in the last 168 hours. No results for input(s): AMMONIA in the last 168 hours. Coagulation Profile: No results for input(s): INR, PROTIME in the last 168 hours. Cardiac Enzymes: No results for input(s): CKTOTAL, CKMB, CKMBINDEX, TROPONINI in the last 168 hours. BNP (last 3 results) No results for input(s): PROBNP in the last 8760 hours. HbA1C: No results for input(s): HGBA1C in the last 72 hours. CBG: Recent Labs  Lab 02/15/19 2022 02/16/19 0757 02/16/19 1542 02/17/19 0001 02/17/19 0753  GLUCAP 113* 100* 93 103* 92   Lipid Profile: No results for input(s): CHOL, HDL, LDLCALC, TRIG, CHOLHDL, LDLDIRECT in the last 72 hours. Thyroid Function Tests: No results for input(s): TSH, T4TOTAL, FREET4, T3FREE, THYROIDAB in the last 72 hours. Anemia Panel: No results for input(s): VITAMINB12, FOLATE, FERRITIN, TIBC, IRON, RETICCTPCT in the last 72 hours. Urine analysis:    Component Value Date/Time   COLORURINE LT. YELLOW 05/22/2012 0824   APPEARANCEUR CLEAR 05/22/2012 0824   LABSPEC 1.025 05/22/2012 0824   PHURINE 6.0 05/22/2012 0824   GLUCOSEU NEGATIVE 05/22/2012 0824   HGBUR NEGATIVE 05/22/2012 0824   HGBUR negative 07/09/2009 0852   BILIRUBINUR n 09/21/2016 0925   KETONESUR NEGATIVE 05/22/2012 0824   PROTEINUR n 09/21/2016 0925   UROBILINOGEN 0.2 09/21/2016 0925   UROBILINOGEN 0.2 05/22/2012 0824   NITRITE n 09/21/2016 0925   NITRITE NEGATIVE 05/22/2012 0824   LEUKOCYTESUR Negative 09/21/2016 0925   Sepsis Labs: Invalid input(s): PROCALCITONIN, LACTICIDVEN  No results found for this or any  previous visit (from the past 240 hour(s)).    Radiology Studies: Nm Gi Blood Loss  Result Date: 02/16/2019 CLINICAL DATA:  Pilar Plate red blood per rectum EXAM: NUCLEAR MEDICINE GASTROINTESTINAL BLEEDING SCAN TECHNIQUE: Sequential abdominal images were obtained following intravenous administration of Tc-27mlabeled red blood cells. RADIOPHARMACEUTICALS:  22.8 mCi Tc-945mertechnetate in-vitro labeled red cells. COMPARISON:  05/26/2018 FINDINGS: Progressive increased radiotracer uptake which peristalsis located in the left upper quadrant. Normal physiologic biodistribution of the radiotracer is demonstrated throughout the abdomen. IMPRESSION: Progressive increased radiotracer uptake which peristalsis located in the left upper quadrant which may arise in a small bowel loop or splenic flexure. Electronically Signed   By: HeKathreen Devoid On: 02/16/2019 13:45   Ir Angiogram Visceral Selective  Result Date: 02/16/2019 INDICATION: History of acute lower GI bleeding with positive preceding nuclear Medicine tagged red blood cell scan with suspected source located within the left upper abdominal quadrant.  Please perform mesenteric arteriogram and percutaneous embolization as indicated. EXAM: 1. ULTRASOUND GUIDANCE FOR ARTERIAL ACCESS 2. SELECTIVE CELIAC ARTERIOGRAM 3. SELECTIVE SUPERIOR MESENTERIC ARTERIOGRAM 4. SELECTIVE INFERIOR MESENTERIC ARTERIOGRAM MEDICATIONS: None ANESTHESIA/SEDATION: Moderate (conscious) sedation was employed during this procedure. A total of Versed 1 mg and Fentanyl 50 mcg was administered intravenously. Moderate Sedation Time: 28 minutes. The patient's level of consciousness and vital signs were monitored continuously by radiology nursing throughout the procedure under my direct supervision. CONTRAST:  100 cc Isovue-300 FLUOROSCOPY TIME:  5 minutes, 42 seconds (915.0 mGy) COMPLICATIONS: None immediate. PROCEDURE: Informed consent was obtained from the patient following explanation of the  procedure, risks, benefits and alternatives. The patient understands, agrees and consents for the procedure. All questions were addressed. A time out was performed prior to the initiation of the procedure. Maximal barrier sterile technique utilized including caps, mask, sterile gowns, sterile gloves, large sterile drape, hand hygiene, and Betadine prep. The right femoral head was marked fluoroscopically. Under sterile conditions and local anesthesia, the right common femoral artery access was performed with a micropuncture needle. Under direct ultrasound guidance, the right common femoral was accessed with a micropuncture kit. An ultrasound image was saved for documentation purposes. This allowed for placement of a 5-French vascular sheath. A limited arteriogram was performed through the side arm of the sheath confirming appropriate access within the right common femoral artery. Mickelson catheter was advanced to the level of the abdominal aorta where it was formed, back bled and flushed. Mickelson catheter was then utilized to select the celiac artery and selective celiac arteriogram was performed Mickelson catheter was then utilized to select the superior mesenteric artery and several superior mesenteric arteriograms were performed with special attention paid to the left upper abdominal quadrant at the location of the suspected origin of the patient's acute lower GI bleeding demonstrated on preceding tagged red blood cell scan. Mickelson catheter was then utilized to select the inferior mesenteric artery and several inferior mesenteric arteriograms were performed, again, with special attention to the left upper abdominal quadrant at the location of the SPECT origin of the patient's acute lower GI bleeding demonstrated on preceding tagged red blood cell scan. Again, the Mickelson catheter was utilized to select the superior mesenteric artery and a selective superior mesenteric arteriogram was performed. Finally,  the [facility] catheter was again utilized to select the inferior mesenteric artery and a selective inferior mesenteric arteriogram was performed. Images were reviewed and the procedure was terminated. All wires, catheters and sheaths were removed from the patient. Hemostasis was achieved at the right groin access site with manual compression. A dressing was placed. The patient tolerated procedure well and remained hemodynamically stable throughout the procedure. FINDINGS: Selective celiac, superior mesenteric and inferior mesenteric arteriograms are all negative for discrete area of active extravasation or vessel irregularity with special attention paid to the left upper abdominal quadrant at the location of the suspected origin of the patient's acute lower GI bleeding questioned on preceding nuclear medicine tagged red blood cell study. IMPRESSION: Negative mesenteric arteriogram performed for acute lower GI bleeding. No embolization performed. PLAN: Recommend continued conservative management and resuscitation as deemed appropriate by the providing clinical team. Above findings were discussed with Dr. Alessandra Bevels at the time of procedure completion. Electronically Signed   By: Sandi Mariscal M.D.   On: 02/16/2019 17:14   Ir Angiogram Visceral Selective  Result Date: 02/16/2019 INDICATION: History of acute lower GI bleeding with positive preceding nuclear Medicine tagged red blood cell scan with suspected source  located within the left upper abdominal quadrant. Please perform mesenteric arteriogram and percutaneous embolization as indicated. EXAM: 1. ULTRASOUND GUIDANCE FOR ARTERIAL ACCESS 2. SELECTIVE CELIAC ARTERIOGRAM 3. SELECTIVE SUPERIOR MESENTERIC ARTERIOGRAM 4. SELECTIVE INFERIOR MESENTERIC ARTERIOGRAM MEDICATIONS: None ANESTHESIA/SEDATION: Moderate (conscious) sedation was employed during this procedure. A total of Versed 1 mg and Fentanyl 50 mcg was administered intravenously. Moderate Sedation Time: 28  minutes. The patient's level of consciousness and vital signs were monitored continuously by radiology nursing throughout the procedure under my direct supervision. CONTRAST:  100 cc Isovue-300 FLUOROSCOPY TIME:  5 minutes, 42 seconds (017.5 mGy) COMPLICATIONS: None immediate. PROCEDURE: Informed consent was obtained from the patient following explanation of the procedure, risks, benefits and alternatives. The patient understands, agrees and consents for the procedure. All questions were addressed. A time out was performed prior to the initiation of the procedure. Maximal barrier sterile technique utilized including caps, mask, sterile gowns, sterile gloves, large sterile drape, hand hygiene, and Betadine prep. The right femoral head was marked fluoroscopically. Under sterile conditions and local anesthesia, the right common femoral artery access was performed with a micropuncture needle. Under direct ultrasound guidance, the right common femoral was accessed with a micropuncture kit. An ultrasound image was saved for documentation purposes. This allowed for placement of a 5-French vascular sheath. A limited arteriogram was performed through the side arm of the sheath confirming appropriate access within the right common femoral artery. Mickelson catheter was advanced to the level of the abdominal aorta where it was formed, back bled and flushed. Mickelson catheter was then utilized to select the celiac artery and selective celiac arteriogram was performed Mickelson catheter was then utilized to select the superior mesenteric artery and several superior mesenteric arteriograms were performed with special attention paid to the left upper abdominal quadrant at the location of the suspected origin of the patient's acute lower GI bleeding demonstrated on preceding tagged red blood cell scan. Mickelson catheter was then utilized to select the inferior mesenteric artery and several inferior mesenteric arteriograms were  performed, again, with special attention to the left upper abdominal quadrant at the location of the SPECT origin of the patient's acute lower GI bleeding demonstrated on preceding tagged red blood cell scan. Again, the Mickelson catheter was utilized to select the superior mesenteric artery and a selective superior mesenteric arteriogram was performed. Finally, the [facility] catheter was again utilized to select the inferior mesenteric artery and a selective inferior mesenteric arteriogram was performed. Images were reviewed and the procedure was terminated. All wires, catheters and sheaths were removed from the patient. Hemostasis was achieved at the right groin access site with manual compression. A dressing was placed. The patient tolerated procedure well and remained hemodynamically stable throughout the procedure. FINDINGS: Selective celiac, superior mesenteric and inferior mesenteric arteriograms are all negative for discrete area of active extravasation or vessel irregularity with special attention paid to the left upper abdominal quadrant at the location of the suspected origin of the patient's acute lower GI bleeding questioned on preceding nuclear medicine tagged red blood cell study. IMPRESSION: Negative mesenteric arteriogram performed for acute lower GI bleeding. No embolization performed. PLAN: Recommend continued conservative management and resuscitation as deemed appropriate by the providing clinical team. Above findings were discussed with Dr. Alessandra Bevels at the time of procedure completion. Electronically Signed   By: Sandi Mariscal M.D.   On: 02/16/2019 17:14   Ir Angiogram Visceral Selective  Result Date: 02/16/2019 INDICATION: History of acute lower GI bleeding with positive preceding nuclear Medicine tagged  red blood cell scan with suspected source located within the left upper abdominal quadrant. Please perform mesenteric arteriogram and percutaneous embolization as indicated. EXAM: 1.  ULTRASOUND GUIDANCE FOR ARTERIAL ACCESS 2. SELECTIVE CELIAC ARTERIOGRAM 3. SELECTIVE SUPERIOR MESENTERIC ARTERIOGRAM 4. SELECTIVE INFERIOR MESENTERIC ARTERIOGRAM MEDICATIONS: None ANESTHESIA/SEDATION: Moderate (conscious) sedation was employed during this procedure. A total of Versed 1 mg and Fentanyl 50 mcg was administered intravenously. Moderate Sedation Time: 28 minutes. The patient's level of consciousness and vital signs were monitored continuously by radiology nursing throughout the procedure under my direct supervision. CONTRAST:  100 cc Isovue-300 FLUOROSCOPY TIME:  5 minutes, 42 seconds (295.1 mGy) COMPLICATIONS: None immediate. PROCEDURE: Informed consent was obtained from the patient following explanation of the procedure, risks, benefits and alternatives. The patient understands, agrees and consents for the procedure. All questions were addressed. A time out was performed prior to the initiation of the procedure. Maximal barrier sterile technique utilized including caps, mask, sterile gowns, sterile gloves, large sterile drape, hand hygiene, and Betadine prep. The right femoral head was marked fluoroscopically. Under sterile conditions and local anesthesia, the right common femoral artery access was performed with a micropuncture needle. Under direct ultrasound guidance, the right common femoral was accessed with a micropuncture kit. An ultrasound image was saved for documentation purposes. This allowed for placement of a 5-French vascular sheath. A limited arteriogram was performed through the side arm of the sheath confirming appropriate access within the right common femoral artery. Mickelson catheter was advanced to the level of the abdominal aorta where it was formed, back bled and flushed. Mickelson catheter was then utilized to select the celiac artery and selective celiac arteriogram was performed Mickelson catheter was then utilized to select the superior mesenteric artery and several superior  mesenteric arteriograms were performed with special attention paid to the left upper abdominal quadrant at the location of the suspected origin of the patient's acute lower GI bleeding demonstrated on preceding tagged red blood cell scan. Mickelson catheter was then utilized to select the inferior mesenteric artery and several inferior mesenteric arteriograms were performed, again, with special attention to the left upper abdominal quadrant at the location of the SPECT origin of the patient's acute lower GI bleeding demonstrated on preceding tagged red blood cell scan. Again, the Mickelson catheter was utilized to select the superior mesenteric artery and a selective superior mesenteric arteriogram was performed. Finally, the [facility] catheter was again utilized to select the inferior mesenteric artery and a selective inferior mesenteric arteriogram was performed. Images were reviewed and the procedure was terminated. All wires, catheters and sheaths were removed from the patient. Hemostasis was achieved at the right groin access site with manual compression. A dressing was placed. The patient tolerated procedure well and remained hemodynamically stable throughout the procedure. FINDINGS: Selective celiac, superior mesenteric and inferior mesenteric arteriograms are all negative for discrete area of active extravasation or vessel irregularity with special attention paid to the left upper abdominal quadrant at the location of the suspected origin of the patient's acute lower GI bleeding questioned on preceding nuclear medicine tagged red blood cell study. IMPRESSION: Negative mesenteric arteriogram performed for acute lower GI bleeding. No embolization performed. PLAN: Recommend continued conservative management and resuscitation as deemed appropriate by the providing clinical team. Above findings were discussed with Dr. Alessandra Bevels at the time of procedure completion. Electronically Signed   By: Sandi Mariscal M.D.    On: 02/16/2019 17:14   Ir US Guide Vasc Access Right  Result Date: 02/16/2019 INDICATION: History of  acute lower GI bleeding with positive preceding nuclear Medicine tagged red blood cell scan with suspected source located within the left upper abdominal quadrant. Please perform mesenteric arteriogram and percutaneous embolization as indicated. EXAM: 1. ULTRASOUND GUIDANCE FOR ARTERIAL ACCESS 2. SELECTIVE CELIAC ARTERIOGRAM 3. SELECTIVE SUPERIOR MESENTERIC ARTERIOGRAM 4. SELECTIVE INFERIOR MESENTERIC ARTERIOGRAM MEDICATIONS: None ANESTHESIA/SEDATION: Moderate (conscious) sedation was employed during this procedure. A total of Versed 1 mg and Fentanyl 50 mcg was administered intravenously. Moderate Sedation Time: 28 minutes. The patient's level of consciousness and vital signs were monitored continuously by radiology nursing throughout the procedure under my direct supervision. CONTRAST:  100 cc Isovue-300 FLUOROSCOPY TIME:  5 minutes, 42 seconds (888.9 mGy) COMPLICATIONS: None immediate. PROCEDURE: Informed consent was obtained from the patient following explanation of the procedure, risks, benefits and alternatives. The patient understands, agrees and consents for the procedure. All questions were addressed. A time out was performed prior to the initiation of the procedure. Maximal barrier sterile technique utilized including caps, mask, sterile gowns, sterile gloves, large sterile drape, hand hygiene, and Betadine prep. The right femoral head was marked fluoroscopically. Under sterile conditions and local anesthesia, the right common femoral artery access was performed with a micropuncture needle. Under direct ultrasound guidance, the right common femoral was accessed with a micropuncture kit. An ultrasound image was saved for documentation purposes. This allowed for placement of a 5-French vascular sheath. A limited arteriogram was performed through the side arm of the sheath confirming appropriate access  within the right common femoral artery. Mickelson catheter was advanced to the level of the abdominal aorta where it was formed, back bled and flushed. Mickelson catheter was then utilized to select the celiac artery and selective celiac arteriogram was performed Mickelson catheter was then utilized to select the superior mesenteric artery and several superior mesenteric arteriograms were performed with special attention paid to the left upper abdominal quadrant at the location of the suspected origin of the patient's acute lower GI bleeding demonstrated on preceding tagged red blood cell scan. Mickelson catheter was then utilized to select the inferior mesenteric artery and several inferior mesenteric arteriograms were performed, again, with special attention to the left upper abdominal quadrant at the location of the SPECT origin of the patient's acute lower GI bleeding demonstrated on preceding tagged red blood cell scan. Again, the Mickelson catheter was utilized to select the superior mesenteric artery and a selective superior mesenteric arteriogram was performed. Finally, the [facility] catheter was again utilized to select the inferior mesenteric artery and a selective inferior mesenteric arteriogram was performed. Images were reviewed and the procedure was terminated. All wires, catheters and sheaths were removed from the patient. Hemostasis was achieved at the right groin access site with manual compression. A dressing was placed. The patient tolerated procedure well and remained hemodynamically stable throughout the procedure. FINDINGS: Selective celiac, superior mesenteric and inferior mesenteric arteriograms are all negative for discrete area of active extravasation or vessel irregularity with special attention paid to the left upper abdominal quadrant at the location of the suspected origin of the patient's acute lower GI bleeding questioned on preceding nuclear medicine tagged red blood cell study.  IMPRESSION: Negative mesenteric arteriogram performed for acute lower GI bleeding. No embolization performed. PLAN: Recommend continued conservative management and resuscitation as deemed appropriate by the providing clinical team. Above findings were discussed with Dr. Alessandra Bevels at the time of procedure completion. Electronically Signed   By: Sandi Mariscal M.D.   On: 02/16/2019 17:14     Kyliah Deanda,  MD, PhD [facility]  Contact via  [email or web address]  [place] P: [phone]  F: [phone]

## 2019-02-17 NOTE — Anesthesia Postprocedure Evaluation (Signed)
Anesthesia Post Note  Patient: David Goodman  Procedure(s) Performed: COLONOSCOPY WITH PROPOFOL (N/A )     Patient location during evaluation: PACU Anesthesia Type: MAC Level of consciousness: awake and alert Pain management: pain level controlled Vital Signs Assessment: post-procedure vital signs reviewed and stable Respiratory status: spontaneous breathing, nonlabored ventilation, respiratory function stable and patient connected to nasal cannula oxygen Cardiovascular status: stable and blood pressure returned to baseline Postop Assessment: no apparent nausea or vomiting Anesthetic complications: no    Last Vitals:  Vitals:   02/17/19 1245 02/17/19 1250  BP: (!) 157/70 (!) 166/89  Pulse: 79 77  Resp: (!) 22 17  Temp: 36.7 C   SpO2: 99% 94%    Last Pain:  Vitals:   02/17/19 1250  TempSrc:   PainSc: 0-No pain                 Effie Berkshire

## 2019-02-17 NOTE — Transfer of Care (Signed)
Immediate Anesthesia Transfer of Care Note  Patient: David Goodman  Procedure(s) Performed: COLONOSCOPY WITH PROPOFOL (N/A )  Patient Location: PACU  Anesthesia Type:MAC  Level of Consciousness: awake, alert  and oriented  Airway & Oxygen Therapy: Patient Spontanous Breathing and Patient connected to face mask oxygen  Post-op Assessment: Report given to RN and Post -op Vital signs reviewed and stable  Post vital signs: Reviewed and stable  Last Vitals:  Vitals Value Taken Time  BP    Temp    Pulse    Resp    SpO2      Last Pain:  Vitals:   02/17/19 1126  TempSrc: Oral  PainSc: 0-No pain      Patients Stated Pain Goal: 2 (68/15/94 7076)  Complications: No apparent anesthesia complications

## 2019-02-18 DIAGNOSIS — D62 Acute posthemorrhagic anemia: Secondary | ICD-10-CM

## 2019-02-18 LAB — BASIC METABOLIC PANEL
Anion gap: 9 (ref 5–15)
BUN: 7 mg/dL — ABNORMAL LOW (ref 8–23)
CO2: 22 mmol/L (ref 22–32)
Calcium: 7.7 mg/dL — ABNORMAL LOW (ref 8.9–10.3)
Chloride: 110 mmol/L (ref 98–111)
Creatinine, Ser: 0.93 mg/dL (ref 0.61–1.24)
GFR calc Af Amer: >60 mL/min (ref >60)
GFR calc non Af Amer: >60 mL/min (ref >60)
Glucose, Bld: 97 mg/dL (ref 70–99)
Potassium: 3.7 mmol/L (ref 3.5–5.1)
Sodium: 141 mmol/L (ref 135–145)

## 2019-02-18 LAB — CBC
HCT: 24.3 % — ABNORMAL LOW (ref 39.0–52.0)
Hemoglobin: 8.1 g/dL — ABNORMAL LOW (ref 13.0–17.0)
MCH: 31.8 pg (ref 26.0–34.0)
MCHC: 33.3 g/dL (ref 30.0–36.0)
MCV: 95.3 fL (ref 80.0–100.0)
Platelets: 184 10*3/uL (ref 150–400)
RBC: 2.55 MIL/uL — ABNORMAL LOW (ref 4.22–5.81)
RDW: 14.6 % (ref 11.5–15.5)
WBC: 7.5 10*3/uL (ref 4.0–10.5)
nRBC: 0 % (ref 0.0–0.2)

## 2019-02-18 LAB — GLUCOSE, CAPILLARY
Glucose-Capillary: 92 mg/dL (ref 70–99)
Glucose-Capillary: 90 mg/dL (ref 70–99)

## 2019-02-18 MED ORDER — PANTOPRAZOLE SODIUM 40 MG PO TBEC: 40.0000 mg | DELAYED_RELEASE_TABLET | Freq: Every day | ORAL | 1 refills | Status: DC

## 2019-02-18 MED ORDER — PSYLLIUM 95 % PO PACK: 1.0000 | PACK | Freq: Two times a day (BID) | ORAL | 1 refills | Status: DC

## 2019-02-18 NOTE — Discharge Summary (Addendum)
Physician Discharge Summary  David Goodman GMW:[id number] DOB: 18-Sep-1941 DOA: 02/13/2019  PCP: Isaac Bliss, Rayford Halsted, MD  Admit date: 02/13/2019 Discharge date: 02/18/2019  Admitted From: Home Disposition: Home  Recommendations for Outpatient Follow-up:  1. Follow up with PCP in 1-2 weeks 2. Please obtain BMP/CBC in one week 3. Follow-up with Dr. Watt Climes as an outpatient as needed  Home Health: None Equipment/Devices: None  Discharge Condition: Stable CODE STATUS: Full code Diet recommendation: Regular diet  HPI: Per admitting MD, David Goodman is a 78 y.o. male with history of hypertension, alcohol abuse who was admitted in June 2019 for diverticular bleed at the time patient also had diverticulitis presents to the ER with complaints of having passed frank blood 3 times prior to coming.  Patient started having frank rectal bleeding since afternoon today.  Denies any abdominal pain nausea or vomiting.  Takes naproxen.  Denies taking aspirin.  Denies chest pain or shortness of breath. ED Course: In the ER patient had 2 more bowel movements which were bloody.  Hemodynamically stable hemoglobin around 13.  Abdomen appears benign on exam admitted for further management of GI bleed likely diverticular.  Hospital Course: Principal Problem Acute blood loss anemia due to diverticular GI bleed-gastroenterology was consulted and followed patient while hospitalized.  His bleeding appears to have initially stopped shortly after admission and he was placed in observation however later on he started to have recurrent bright red blood per rectum.  He underwent a stat tagged RBC scan which showed progressive increased radiotracer uptake in the left upper quadrant concerning for bleed in the splenic flexure.  IR was consulted and patient was taking and underwent angiogram however this was negative and no embolization was performed.  He then underwent a colonoscopy on 02/17/2019 which showed widespread  diverticulosis without any active bleeding, only old blood in the colon.  His bleeding now appears to have been stopped, he is tolerating a regular diet, discussed with GI, and will discharge home in stable condition.  During this hospital stay he was transfused a total of 3 units of packed red blood cells and hemoglobin on discharge is 8.1.  Recommend follow-up CBC as an outpatient within 1 to 2 weeks.  Recommended to stop the naproxen and he was placed on Metamucil as per GI recommendations Active Problems Hypertension -Blood pressure has remained stable History of alcohol use -did not trigger CIWA while hospitalized.  LFTs were within normal limits on admission.  Discharge Diagnoses:  Principal Problem:   Acute GI bleeding Active Problems:   Essential hypertension  Discharge Instructions  Allergies as of 02/18/2019      Reactions   Lisinopril Cough   Cough   Tramadol Hcl    REACTION: unspecified      Medication List    STOP taking these medications   naproxen 500 MG tablet Commonly known as:  NAPROSYN     TAKE these medications   diphenhydrAMINE 25 MG tablet Commonly known as:  BENADRYL Take 25 mg by mouth daily as needed for allergies.   fluticasone 50 MCG/ACT nasal spray Commonly known as:  FLONASE USE 2 SPRAYS IN EACH  NOSTRIL DAILY What changed:    how much to take  how to take this  when to take this   latanoprost 0.005 % ophthalmic solution Commonly known as:  XALATAN Place 1 drop into both eyes at bedtime.   levocetirizine 5 MG tablet Commonly known as:  XYZAL TAKE 1 TABLET BY MOUTH  EVERY EVENING  losartan 100 MG tablet Commonly known as:  COZAAR TAKE 1 TABLET BY MOUTH  DAILY   MAGNESIUM PO Take 1 tablet by mouth daily.   omeprazole 20 MG capsule Commonly known as:  PRILOSEC TAKE 1 CAPSULE BY MOUTH  DAILY   pantoprazole 40 MG tablet Commonly known as:  PROTONIX Take 1 tablet (40 mg total) by mouth daily.   psyllium 95 % Pack Commonly  known as:  HYDROCIL/METAMUCIL Take 1 packet by mouth 2 (two) times daily. What changed:  when to take this   tadalafil 5 MG tablet Commonly known as:  Cialis Take 1 tablet (5 mg total) by mouth daily as needed for erectile dysfunction.   Testosterone Cypionate 100 MG/ML Soln Inject 100 mg as directed every 14 (fourteen) days.   VITAMIN C PO Take 1 tablet by mouth daily.      Follow-up Information    Clarene Essex, MD. Schedule an appointment as soon as possible for a visit in 2 week(s).   Specialty:  Gastroenterology Contact information: [address] [place] [address] [phone]        Isaac Bliss, Rayford Halsted, MD Follow up.   Specialty:  Internal Medicine Why:  as needed Contact information: [place] [address] [phone]           Consultations:  Gastroenterology  Procedures/Studies:  EGD Impression:               - Preparation of the colon was fair.                           - Diverticulosis in the sigmoid colon, in the                            descending colon, at the splenic flexure, in the                            transverse colon, at the hepatic flexure and in the                            ascending colon.                           - Old blood in the entire examined colon. Evidence                            of recent but not active bleeding noted.                           - Liquid yellow brown stool in the ascending colon,                            in the cecum and at the appendiceal orifice.                           - The distal rectum and anal verge are normal on                            retroflexion view.                           -  No specimens collected. IR mesenteric angiogram   Nm Gi Blood Loss  Result Date: 02/16/2019 CLINICAL DATA:  Pilar Plate red blood per rectum EXAM: NUCLEAR MEDICINE GASTROINTESTINAL BLEEDING SCAN TECHNIQUE: Sequential abdominal images were obtained following  intravenous administration of Tc-77mlabeled red blood cells. RADIOPHARMACEUTICALS:  22.8 mCi Tc-938mertechnetate in-vitro labeled red cells. COMPARISON:  05/26/2018 FINDINGS: Progressive increased radiotracer uptake which peristalsis located in the left upper quadrant. Normal physiologic biodistribution of the radiotracer is demonstrated throughout the abdomen. IMPRESSION: Progressive increased radiotracer uptake which peristalsis located in the left upper quadrant which may arise in a small bowel loop or splenic flexure. Electronically Signed   By: HeKathreen Devoid On: 02/16/2019 13:45   Ir Angiogram Visceral Selective  Result Date: 02/16/2019 INDICATION: History of acute lower GI bleeding with positive preceding nuclear Medicine tagged red blood cell scan with suspected source located within the left upper abdominal quadrant. Please perform mesenteric arteriogram and percutaneous embolization as indicated. EXAM: 1. ULTRASOUND GUIDANCE FOR ARTERIAL ACCESS 2. SELECTIVE CELIAC ARTERIOGRAM 3. SELECTIVE SUPERIOR MESENTERIC ARTERIOGRAM 4. SELECTIVE INFERIOR MESENTERIC ARTERIOGRAM MEDICATIONS: None ANESTHESIA/SEDATION: Moderate (conscious) sedation was employed during this procedure. A total of Versed 1 mg and Fentanyl 50 mcg was administered intravenously. Moderate Sedation Time: 28 minutes. The patient's level of consciousness and vital signs were monitored continuously by radiology nursing throughout the procedure under my direct supervision. CONTRAST:  100 cc Isovue-300 FLUOROSCOPY TIME:  5 minutes, 42 seconds (10382.5Gy) COMPLICATIONS: None immediate. PROCEDURE: Informed consent was obtained from the patient following explanation of the procedure, risks, benefits and alternatives. The patient understands, agrees and consents for the procedure. All questions were addressed. A time out was performed prior to the initiation of the procedure. Maximal barrier sterile technique utilized including caps, mask, sterile  gowns, sterile gloves, large sterile drape, hand hygiene, and Betadine prep. The right femoral head was marked fluoroscopically. Under sterile conditions and local anesthesia, the right common femoral artery access was performed with a micropuncture needle. Under direct ultrasound guidance, the right common femoral was accessed with a micropuncture kit. An ultrasound image was saved for documentation purposes. This allowed for placement of a 5-French vascular sheath. A limited arteriogram was performed through the side arm of the sheath confirming appropriate access within the right common femoral artery. Mickelson catheter was advanced to the level of the abdominal aorta where it was formed, back bled and flushed. Mickelson catheter was then utilized to select the celiac artery and selective celiac arteriogram was performed Mickelson catheter was then utilized to select the superior mesenteric artery and several superior mesenteric arteriograms were performed with special attention paid to the left upper abdominal quadrant at the location of the suspected origin of the patient's acute lower GI bleeding demonstrated on preceding tagged red blood cell scan. Mickelson catheter was then utilized to select the inferior mesenteric artery and several inferior mesenteric arteriograms were performed, again, with special attention to the left upper abdominal quadrant at the location of the SPECT origin of the patient's acute lower GI bleeding demonstrated on preceding tagged red blood cell scan. Again, the Mickelson catheter was utilized to select the superior mesenteric artery and a selective superior mesenteric arteriogram was performed. Finally, the [facility] was again utilized to select the inferior mesenteric artery and a selective inferior mesenteric arteriogram was performed. Images were reviewed and the procedure was terminated. All wires, catheters and sheaths were removed from the patient. Hemostasis was  achieved at the right groin access site with manual  compression. A dressing was placed. The patient tolerated procedure well and remained hemodynamically stable throughout the procedure. FINDINGS: Selective celiac, superior mesenteric and inferior mesenteric arteriograms are all negative for discrete area of active extravasation or vessel irregularity with special attention paid to the left upper abdominal quadrant at the location of the suspected origin of the patient's acute lower GI bleeding questioned on preceding nuclear medicine tagged red blood cell study. IMPRESSION: Negative mesenteric arteriogram performed for acute lower GI bleeding. No embolization performed. PLAN: Recommend continued conservative management and resuscitation as deemed appropriate by the providing clinical team. Above findings were discussed with Dr. Alessandra Bevels at the time of procedure completion. Electronically Signed   By: Sandi Mariscal M.D.   On: 02/16/2019 17:14   Ir Angiogram Visceral Selective  Result Date: 02/16/2019 INDICATION: History of acute lower GI bleeding with positive preceding nuclear Medicine tagged red blood cell scan with suspected source located within the left upper abdominal quadrant. Please perform mesenteric arteriogram and percutaneous embolization as indicated. EXAM: 1. ULTRASOUND GUIDANCE FOR ARTERIAL ACCESS 2. SELECTIVE CELIAC ARTERIOGRAM 3. SELECTIVE SUPERIOR MESENTERIC ARTERIOGRAM 4. SELECTIVE INFERIOR MESENTERIC ARTERIOGRAM MEDICATIONS: None ANESTHESIA/SEDATION: Moderate (conscious) sedation was employed during this procedure. A total of Versed 1 mg and Fentanyl 50 mcg was administered intravenously. Moderate Sedation Time: 28 minutes. The patient's level of consciousness and vital signs were monitored continuously by radiology nursing throughout the procedure under my direct supervision. CONTRAST:  100 cc Isovue-300 FLUOROSCOPY TIME:  5 minutes, 42 seconds (875.6 mGy) COMPLICATIONS: None immediate.  PROCEDURE: Informed consent was obtained from the patient following explanation of the procedure, risks, benefits and alternatives. The patient understands, agrees and consents for the procedure. All questions were addressed. A time out was performed prior to the initiation of the procedure. Maximal barrier sterile technique utilized including caps, mask, sterile gowns, sterile gloves, large sterile drape, hand hygiene, and Betadine prep. The right femoral head was marked fluoroscopically. Under sterile conditions and local anesthesia, the right common femoral artery access was performed with a micropuncture needle. Under direct ultrasound guidance, the right common femoral was accessed with a micropuncture kit. An ultrasound image was saved for documentation purposes. This allowed for placement of a 5-French vascular sheath. A limited arteriogram was performed through the side arm of the sheath confirming appropriate access within the right common femoral artery. Mickelson catheter was advanced to the level of the abdominal aorta where it was formed, back bled and flushed. Mickelson catheter was then utilized to select the celiac artery and selective celiac arteriogram was performed Mickelson catheter was then utilized to select the superior mesenteric artery and several superior mesenteric arteriograms were performed with special attention paid to the left upper abdominal quadrant at the location of the suspected origin of the patient's acute lower GI bleeding demonstrated on preceding tagged red blood cell scan. Mickelson catheter was then utilized to select the inferior mesenteric artery and several inferior mesenteric arteriograms were performed, again, with special attention to the left upper abdominal quadrant at the location of the SPECT origin of the patient's acute lower GI bleeding demonstrated on preceding tagged red blood cell scan. Again, the Mickelson catheter was utilized to select the superior  mesenteric artery and a selective superior mesenteric arteriogram was performed. Finally, the [facility] catheter was again utilized to select the inferior mesenteric artery and a selective inferior mesenteric arteriogram was performed. Images were reviewed and the procedure was terminated. All wires, catheters and sheaths were removed from the patient. Hemostasis was achieved at  the right groin access site with manual compression. A dressing was placed. The patient tolerated procedure well and remained hemodynamically stable throughout the procedure. FINDINGS: Selective celiac, superior mesenteric and inferior mesenteric arteriograms are all negative for discrete area of active extravasation or vessel irregularity with special attention paid to the left upper abdominal quadrant at the location of the suspected origin of the patient's acute lower GI bleeding questioned on preceding nuclear medicine tagged red blood cell study. IMPRESSION: Negative mesenteric arteriogram performed for acute lower GI bleeding. No embolization performed. PLAN: Recommend continued conservative management and resuscitation as deemed appropriate by the providing clinical team. Above findings were discussed with Dr. Alessandra Bevels at the time of procedure completion. Electronically Signed   By: Sandi Mariscal M.D.   On: 02/16/2019 17:14   Ir Angiogram Visceral Selective  Result Date: 02/16/2019 INDICATION: History of acute lower GI bleeding with positive preceding nuclear Medicine tagged red blood cell scan with suspected source located within the left upper abdominal quadrant. Please perform mesenteric arteriogram and percutaneous embolization as indicated. EXAM: 1. ULTRASOUND GUIDANCE FOR ARTERIAL ACCESS 2. SELECTIVE CELIAC ARTERIOGRAM 3. SELECTIVE SUPERIOR MESENTERIC ARTERIOGRAM 4. SELECTIVE INFERIOR MESENTERIC ARTERIOGRAM MEDICATIONS: None ANESTHESIA/SEDATION: Moderate (conscious) sedation was employed during this procedure. A total of  Versed 1 mg and Fentanyl 50 mcg was administered intravenously. Moderate Sedation Time: 28 minutes. The patient's level of consciousness and vital signs were monitored continuously by radiology nursing throughout the procedure under my direct supervision. CONTRAST:  100 cc Isovue-300 FLUOROSCOPY TIME:  5 minutes, 42 seconds (762.8 mGy) COMPLICATIONS: None immediate. PROCEDURE: Informed consent was obtained from the patient following explanation of the procedure, risks, benefits and alternatives. The patient understands, agrees and consents for the procedure. All questions were addressed. A time out was performed prior to the initiation of the procedure. Maximal barrier sterile technique utilized including caps, mask, sterile gowns, sterile gloves, large sterile drape, hand hygiene, and Betadine prep. The right femoral head was marked fluoroscopically. Under sterile conditions and local anesthesia, the right common femoral artery access was performed with a micropuncture needle. Under direct ultrasound guidance, the right common femoral was accessed with a micropuncture kit. An ultrasound image was saved for documentation purposes. This allowed for placement of a 5-French vascular sheath. A limited arteriogram was performed through the side arm of the sheath confirming appropriate access within the right common femoral artery. Mickelson catheter was advanced to the level of the abdominal aorta where it was formed, back bled and flushed. Mickelson catheter was then utilized to select the celiac artery and selective celiac arteriogram was performed Mickelson catheter was then utilized to select the superior mesenteric artery and several superior mesenteric arteriograms were performed with special attention paid to the left upper abdominal quadrant at the location of the suspected origin of the patient's acute lower GI bleeding demonstrated on preceding tagged red blood cell scan. Mickelson catheter was then utilized to  select the inferior mesenteric artery and several inferior mesenteric arteriograms were performed, again, with special attention to the left upper abdominal quadrant at the location of the SPECT origin of the patient's acute lower GI bleeding demonstrated on preceding tagged red blood cell scan. Again, the Mickelson catheter was utilized to select the superior mesenteric artery and a selective superior mesenteric arteriogram was performed. Finally, the [facility] catheter was again utilized to select the inferior mesenteric artery and a selective inferior mesenteric arteriogram was performed. Images were reviewed and the procedure was terminated. All wires, catheters and sheaths were removed  from the patient. Hemostasis was achieved at the right groin access site with manual compression. A dressing was placed. The patient tolerated procedure well and remained hemodynamically stable throughout the procedure. FINDINGS: Selective celiac, superior mesenteric and inferior mesenteric arteriograms are all negative for discrete area of active extravasation or vessel irregularity with special attention paid to the left upper abdominal quadrant at the location of the suspected origin of the patient's acute lower GI bleeding questioned on preceding nuclear medicine tagged red blood cell study. IMPRESSION: Negative mesenteric arteriogram performed for acute lower GI bleeding. No embolization performed. PLAN: Recommend continued conservative management and resuscitation as deemed appropriate by the providing clinical team. Above findings were discussed with Dr. Alessandra Bevels at the time of procedure completion. Electronically Signed   By: Sandi Mariscal M.D.   On: 02/16/2019 17:14   Ir US Guide Vasc Access Right  Result Date: 02/16/2019 INDICATION: History of acute lower GI bleeding with positive preceding nuclear Medicine tagged red blood cell scan with suspected source located within the left upper abdominal quadrant. Please  perform mesenteric arteriogram and percutaneous embolization as indicated. EXAM: 1. ULTRASOUND GUIDANCE FOR ARTERIAL ACCESS 2. SELECTIVE CELIAC ARTERIOGRAM 3. SELECTIVE SUPERIOR MESENTERIC ARTERIOGRAM 4. SELECTIVE INFERIOR MESENTERIC ARTERIOGRAM MEDICATIONS: None ANESTHESIA/SEDATION: Moderate (conscious) sedation was employed during this procedure. A total of Versed 1 mg and Fentanyl 50 mcg was administered intravenously. Moderate Sedation Time: 28 minutes. The patient's level of consciousness and vital signs were monitored continuously by radiology nursing throughout the procedure under my direct supervision. CONTRAST:  100 cc Isovue-300 FLUOROSCOPY TIME:  5 minutes, 42 seconds (408.1 mGy) COMPLICATIONS: None immediate. PROCEDURE: Informed consent was obtained from the patient following explanation of the procedure, risks, benefits and alternatives. The patient understands, agrees and consents for the procedure. All questions were addressed. A time out was performed prior to the initiation of the procedure. Maximal barrier sterile technique utilized including caps, mask, sterile gowns, sterile gloves, large sterile drape, hand hygiene, and Betadine prep. The right femoral head was marked fluoroscopically. Under sterile conditions and local anesthesia, the right common femoral artery access was performed with a micropuncture needle. Under direct ultrasound guidance, the right common femoral was accessed with a micropuncture kit. An ultrasound image was saved for documentation purposes. This allowed for placement of a 5-French vascular sheath. A limited arteriogram was performed through the side arm of the sheath confirming appropriate access within the right common femoral artery. Mickelson catheter was advanced to the level of the abdominal aorta where it was formed, back bled and flushed. Mickelson catheter was then utilized to select the celiac artery and selective celiac arteriogram was performed Mickelson  catheter was then utilized to select the superior mesenteric artery and several superior mesenteric arteriograms were performed with special attention paid to the left upper abdominal quadrant at the location of the suspected origin of the patient's acute lower GI bleeding demonstrated on preceding tagged red blood cell scan. Mickelson catheter was then utilized to select the inferior mesenteric artery and several inferior mesenteric arteriograms were performed, again, with special attention to the left upper abdominal quadrant at the location of the SPECT origin of the patient's acute lower GI bleeding demonstrated on preceding tagged red blood cell scan. Again, the Mickelson catheter was utilized to select the superior mesenteric artery and a selective superior mesenteric arteriogram was performed. Finally, the [facility] catheter was again utilized to select the inferior mesenteric artery and a selective inferior mesenteric arteriogram was performed. Images were reviewed and the procedure  was terminated. All wires, catheters and sheaths were removed from the patient. Hemostasis was achieved at the right groin access site with manual compression. A dressing was placed. The patient tolerated procedure well and remained hemodynamically stable throughout the procedure. FINDINGS: Selective celiac, superior mesenteric and inferior mesenteric arteriograms are all negative for discrete area of active extravasation or vessel irregularity with special attention paid to the left upper abdominal quadrant at the location of the suspected origin of the patient's acute lower GI bleeding questioned on preceding nuclear medicine tagged red blood cell study. IMPRESSION: Negative mesenteric arteriogram performed for acute lower GI bleeding. No embolization performed. PLAN: Recommend continued conservative management and resuscitation as deemed appropriate by the providing clinical team. Above findings were discussed with Dr.  Alessandra Bevels at the time of procedure completion. Electronically Signed   By: Sandi Mariscal M.D.   On: 02/16/2019 17:14      Subjective: - no chest pain, shortness of breath, no abdominal pain, nausea or vomiting. Wants to go home  Discharge Exam: BP (!) 141/81 (BP Location: Right Arm)    Pulse 73    Temp 98.1 F (36.7 C) (Oral)    Resp 19    Ht 5' 3.75" (1.619 m)    Wt 79.8 kg    SpO2 98%    BMI 30.45 kg/m   General: Pt is alert, awake, not in acute distress Cardiovascular: RRR, S1/S2 +, no rubs, no gallops Respiratory: CTA bilaterally, no wheezing, no rhonchi Abdominal: Soft, NT, ND, bowel sounds + Extremities: no edema, no cyanosis    The results of significant diagnostics from this hospitalization (including imaging, microbiology, ancillary and laboratory) are listed below for reference.     Microbiology: No results found for this or any previous visit (from the past 240 hour(s)).   Labs: BNP (last 3 results) No results for input(s): BNP in the last 8760 hours. Basic Metabolic Panel: Recent Labs  Lab 02/14/19 0535 02/15/19 0521 02/16/19 0529 02/17/19 0544 02/18/19 0555  NA 139 139 142 139 141  K 3.8 3.7 3.8 3.5 3.7  CL 109 109 113* 109 110  CO2 '24 23 25 23 22  '$ GLUCOSE 112* 119* 112* 100* 97  BUN 33* '22 12 14 '$ 7*  CREATININE 0.92 0.85 0.92 0.92 0.93  CALCIUM 8.3* 7.6* 7.9* 7.7* 7.7*   Liver Function Tests: Recent Labs  Lab 02/13/19 1732  AST 28  ALT 25  ALKPHOS 49  BILITOT 0.6  PROT 6.2*  ALBUMIN 4.0   No results for input(s): LIPASE, AMYLASE in the last 168 hours. No results for input(s): AMMONIA in the last 168 hours. CBC: Recent Labs  Lab 02/14/19 2254  02/15/19 1600 02/16/19 0529 02/16/19 1945 02/17/19 0544 02/18/19 0555  WBC 10.1   < > 6.2 7.0 12.1* 8.2 7.5  NEUTROABS 5.9  --   --   --   --   --   --   HGB 8.5*   < > 7.8* 6.8* 9.3* 6.8* 8.1*  HCT 25.1*   < > 24.0* 19.7* 27.3* 20.2* 24.3*  MCV 93.3   < > 94.9 93.4 92.9 94.0 95.3  PLT 213   <  > 178 159 227 160 184   < > = values in this interval not displayed.   Cardiac Enzymes: No results for input(s): CKTOTAL, CKMB, CKMBINDEX, TROPONINI in the last 168 hours. BNP: Invalid input(s): POCBNP CBG: Recent Labs  Lab 02/17/19 0753 02/17/19 1620 02/17/19 2034 02/18/19 0523 02/18/19 0744  GLUCAP 92 88  133* 92 90   D-Dimer No results for input(s): DDIMER in the last 72 hours. Hgb A1c No results for input(s): HGBA1C in the last 72 hours. Lipid Profile No results for input(s): CHOL, HDL, LDLCALC, TRIG, CHOLHDL, LDLDIRECT in the last 72 hours. Thyroid function studies No results for input(s): TSH, T4TOTAL, T3FREE, THYROIDAB in the last 72 hours.  Invalid input(s): FREET3 Anemia work up No results for input(s): VITAMINB12, FOLATE, FERRITIN, TIBC, IRON, RETICCTPCT in the last 72 hours. Urinalysis    Component Value Date/Time   COLORURINE LT. YELLOW 05/22/2012 0824   APPEARANCEUR CLEAR 05/22/2012 0824   LABSPEC 1.025 05/22/2012 0824   PHURINE 6.0 05/22/2012 0824   GLUCOSEU NEGATIVE 05/22/2012 0824   HGBUR NEGATIVE 05/22/2012 0824   HGBUR negative 07/09/2009 0852   BILIRUBINUR n 09/21/2016 0925   KETONESUR NEGATIVE 05/22/2012 0824   PROTEINUR n 09/21/2016 0925   UROBILINOGEN 0.2 09/21/2016 0925   UROBILINOGEN 0.2 05/22/2012 0824   NITRITE n 09/21/2016 0925   NITRITE NEGATIVE 05/22/2012 0824   LEUKOCYTESUR Negative 09/21/2016 0925   Sepsis Labs Invalid input(s): PROCALCITONIN,  WBC,  LACTICIDVEN  FURTHER DISCHARGE INSTRUCTIONS:   Get Medicines reviewed and adjusted: Please take all your medications with you for your next visit with your Primary MD   Laboratory/radiological data: Please request your Primary MD to go over all hospital tests and procedure/radiological results at the follow up, please ask your Primary MD to get all Hospital records sent to his/her office.   In some cases, they will be blood work, cultures and biopsy results pending at the time of  your discharge. Please request that your primary care M.D. goes through all the records of your hospital data and follows up on these results.   Also Note the following: If you experience worsening of your admission symptoms, develop shortness of breath, life threatening emergency, suicidal or homicidal thoughts you must seek medical attention immediately by calling 911 or calling your MD immediately  if symptoms less severe.   You must read complete instructions/literature along with all the possible adverse reactions/side effects for all the Medicines you take and that have been prescribed to you. Take any new Medicines after you have completely understood and accpet all the possible adverse reactions/side effects.    Do not drive when taking Pain medications or sleeping medications (Benzodaizepines)   Do not take more than prescribed Pain, Sleep and Anxiety Medications. It is not advisable to combine anxiety,sleep and pain medications without talking with your primary care practitioner   Special Instructions: If you have smoked or chewed Tobacco  in the last 2 yrs please stop smoking, stop any regular Alcohol  and or any Recreational drug use.   Wear Seat belts while driving.   Please note: You were cared for by a hospitalist during your hospital stay. Once you are discharged, your primary care physician will handle any further medical issues. Please note that NO REFILLS for any discharge medications will be authorized once you are discharged, as it is imperative that you return to your primary care physician (or establish a relationship with a primary care physician if you do not have one) for your post hospital discharge needs so that they can reassess your need for medications and monitor your lab values.  Time coordinating discharge: 35 minutes  SIGNED:  Marzetta Board, PA-S 02/18/2019, 9:45 AM

## 2019-02-18 NOTE — Discharge Instructions (Signed)
Follow with David Goodman, David Halsted, MD in 5-7 days  Please get a complete blood count and chemistry panel checked by your Primary MD at your next visit, and again as instructed by your Primary MD. Please get your medications reviewed and adjusted by your Primary MD.  Please request your Primary MD to go over all Hospital Tests and Procedure/Radiological results at the follow up, please get all Hospital records sent to your Prim MD by signing hospital release before you go home.  In some cases, there will be blood work, cultures and biopsy results pending at the time of your discharge. Please request that your primary care M.D. goes through all the records of your hospital data and follows up on these results.  If you had Pneumonia of Lung problems at the Hospital: Please get a 2 view Chest X ray done in 6-8 weeks after hospital discharge or sooner if instructed by your Primary MD.  If you have Congestive Heart Failure: Please call your Cardiologist or Primary MD anytime you have any of the following symptoms:  1) 3 pound weight gain in 24 hours or 5 pounds in 1 week  2) shortness of breath, with or without a dry hacking cough  3) swelling in the hands, feet or stomach  4) if you have to sleep on extra pillows at night in order to breathe  Follow cardiac low salt diet and 1.5 lit/day fluid restriction.  If you have diabetes Accuchecks 4 times/day, Once in AM empty stomach and then before each meal. Log in all results and show them to your primary doctor at your next visit. If any glucose reading is under 80 or above 300 call your primary MD immediately.  If you have Seizure/Convulsions/Epilepsy: Please do not drive, operate heavy machinery, participate in activities at heights or participate in high speed sports until you have seen by Primary MD or a Neurologist and advised to do so again.  If you had Gastrointestinal Bleeding: Please ask your Primary MD to check a complete blood  count within one week of discharge or at your next visit. Your endoscopic/colonoscopic biopsies that are pending at the time of discharge, will also need to followed by your Primary MD.  Get Medicines reviewed and adjusted. Please take all your medications with you for your next visit with your Primary MD  Please request your Primary MD to go over all hospital tests and procedure/radiological results at the follow up, please ask your Primary MD to get all Hospital records sent to his/her office.  If you experience worsening of your admission symptoms, develop shortness of breath, life threatening emergency, suicidal or homicidal thoughts you must seek medical attention immediately by calling 911 or calling your MD immediately  if symptoms less severe.  You must read complete instructions/literature along with all the possible adverse reactions/side effects for all the Medicines you take and that have been prescribed to you. Take any new Medicines after you have completely understood and accpet all the possible adverse reactions/side effects.   Do not drive or operate heavy machinery when taking Pain medications.   Do not take more than prescribed Pain, Sleep and Anxiety Medications  Special Instructions: If you have smoked or chewed Tobacco  in the last 2 yrs please stop smoking, stop any regular Alcohol  and or any Recreational drug use.  Wear Seat belts while driving.  Please note You were cared for by a hospitalist during your hospital stay. If you have any questions about  your discharge medications or the care you received while you were in the hospital after you are discharged, you can call the unit and asked to speak with the hospitalist on call if the hospitalist that took care of you is not available. Once you are discharged, your primary care physician will handle any further medical issues. Please note that NO REFILLS for any discharge medications will be authorized once you are  discharged, as it is imperative that you return to your primary care physician (or establish a relationship with a primary care physician if you do not have one) for your aftercare needs so that they can reassess your need for medications and monitor your lab values.  You can reach the hospitalist office at phone [phone] or fax [phone]   If you do not have a primary care physician, you can call [phone] for a physician referral.  Activity: As tolerated with Full fall precautions use walker/cane & assistance as needed    Diet: regular  Disposition Home

## 2019-02-19 LAB — TYPE AND SCREEN
ABO/RH(D): A POS
Antibody Screen: NEGATIVE
Unit division: 0

## 2019-02-19 LAB — BPAM RBC
Blood Product Expiration Date: 202004132359
ISSUE DATE / TIME: 202003210951
Unit Type and Rh: 6200

## 2019-02-20 ENCOUNTER — Telehealth: Payer: Self-pay | Admitting: Internal Medicine

## 2019-02-20 NOTE — Telephone Encounter (Signed)
Transition Care Management Follow-up Telephone Call  Admit date: 02/13/2019 Discharge date: 02/18/2019  Admitted From: Home Disposition: Home  Recommendations for Outpatient Follow-up:  1. Follow up with PCP in 1-2 weeks 2. Please obtain BMP/CBC in one week 3. Follow-up with Dr. Watt Climes as an outpatient as needed    Date discharged?  02/18/2019   How have you been since you were released from the hospital? "Im doing okay, getting better every day. 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 I am still a little weak"   Do you understand why you were in the hospital? yes   Do you understand the discharge instructions? yes   Where were you discharged to? Home, Wife is in [place] visiting her father. He stayed with his Sister-in-law for a few days after d/c   Items Reviewed:  Medications reviewed: yes, started a new Iron Supp upon d/c  Allergies reviewed: yes  Dietary changes reviewed: yes  Referrals reviewed: yes   Functional Questionnaire:   Activities of Daily Living (ADLs):   He states they are independent in the following: ambulation, bathing and hygiene, feeding, continence, grooming, toileting and dressing States they require assistance with the following: n/a   Any transportation issues/concerns?: yes   Any patient concerns? yes, Pt wants to ensure that his blood count is followed up on -- pt is currently on Iron Supp   Confirmed importance and date/time of follow-up visits scheduled   Provider Appointment booked with Dr Jerilee Hoh 02/22/2019 at 8am with Dr Jerilee Hoh -- aware of risks of coming into the office but is still persistent on coming in so that he can have lab work drawn. Dr Jerilee Hoh is okay with this.   Confirmed with patient if condition begins to worsen call PCP or go to the ER.  Patient was given the office number and encouraged to call back with question or concerns.  : yes

## 2019-02-21 NOTE — Telephone Encounter (Signed)
Pt scheduled with Dr Jerilee Hoh in office - okay per Dr Jerilee Hoh   Confirmed importance and date/time of follow-up visits scheduled   Provider Appointment booked with Dr Jerilee Hoh 02/22/2019 at 8am with Dr Jerilee Hoh -- aware of risks of coming into the office and possible exposure but is still persistent on coming in for an "in person" visit so that he can have lab work drawn. Dr Jerilee Hoh is okay with this.    Will send to Dr Jerilee Hoh as Juluis Rainier. Nothing further needed.

## 2019-02-21 NOTE — Telephone Encounter (Signed)
Need to reschedule appt -- scheduled with wrong Provider.  Patient of Dr Jerilee Hoh

## 2019-02-21 NOTE — Telephone Encounter (Signed)
LM for call back to schedule appt with Dr Jerilee Hoh for [place]

## 2019-02-21 NOTE — Telephone Encounter (Signed)
Unable to reach patient at time of TCM Call. Left message for patient to return call when available.  

## 2019-02-21 NOTE — Telephone Encounter (Signed)
Ok to schedule in person visit as long as we have discussed potential risk of coming in. Thanks.

## 2019-02-21 NOTE — Telephone Encounter (Signed)
Pt returned call - declining virtual visit. Pt is wanting his labs checked as soon as possible. States that he wants to come in. I advised that we are trying to see all patients via Webex rather than in the office - in order to protect their health.  Pt is requesting an in office visit only.   Please advise Dr Jerilee Hoh. Thanks.

## 2019-02-23 ENCOUNTER — Other Ambulatory Visit: Payer: Self-pay

## 2019-02-23 ENCOUNTER — Encounter: Payer: Self-pay | Admitting: Internal Medicine

## 2019-02-23 ENCOUNTER — Ambulatory Visit (INDEPENDENT_AMBULATORY_CARE_PROVIDER_SITE_OTHER): Payer: Medicare Other | Admitting: Internal Medicine

## 2019-02-23 VITALS — BP 110/80 | HR 81 | Temp 97.9°F | Wt 181.3 lb

## 2019-02-23 DIAGNOSIS — K922 Gastrointestinal hemorrhage, unspecified: Secondary | ICD-10-CM | POA: Diagnosis not present

## 2019-02-23 DIAGNOSIS — Z09 Encounter for follow-up examination after completed treatment for conditions other than malignant neoplasm: Secondary | ICD-10-CM | POA: Diagnosis not present

## 2019-02-23 DIAGNOSIS — N529 Male erectile dysfunction, unspecified: Secondary | ICD-10-CM

## 2019-02-23 LAB — CBC
HCT: 29.8 % — ABNORMAL LOW (ref 39.0–52.0)
Hemoglobin: 10.3 g/dL — ABNORMAL LOW (ref 13.0–17.0)
MCHC: 34.5 g/dL (ref 30.0–36.0)
MCV: 91.8 fl (ref 78.0–100.0)
Platelets: 342 10*3/uL (ref 150.0–400.0)
RBC: 3.25 Mil/uL — ABNORMAL LOW (ref 4.22–5.81)
RDW: 15.3 % (ref 11.5–15.5)
WBC: 8.6 10*3/uL (ref 4.0–10.5)

## 2019-02-23 MED ORDER — TADALAFIL 20 MG PO TABS: 20.0000 mg | ORAL_TABLET | Freq: Every day | ORAL | 0 refills | Status: DC | PRN

## 2019-02-23 NOTE — Progress Notes (Signed)
Established Patient Office Visit     CC/Reason for Visit: Hospital follow-up  HPI: Ronin Rehfeldt is a 78 y.o. male who is coming in today for the above mentioned reasons. 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  He was hospitalized from 3/17 to 02/18/2019.  Hospital records have been reviewed in detail.  In brief: he presented with bright red blood per rectum.  He underwent a tagged RBC scan which showed progressive increased radiotracer uptake in the left upper quadrant concerning for bleed in the splenic flexure.  IR was consulted and patient underwent angiogram however this was negative and embolization was not performed.  He then had a colonoscopy on 3/21 that showed widespread diverticulosis without active bleeding only old blood in the colon.  His bleeding appeared to stabilize.  He was transfused a total of 3 units of PRBCs and hemoglobin on discharge was 8.1.  Was advised to stop alcohol and NSAID use and to follow-up with [place] in 1 week for CBC to follow hemoglobin.  He has had no further bleeding, be bright red or melanotic stools.  He is feeling well.  He states he has not taken any NSAIDs since discharge and he has decided to quit alcohol.    His wife has been stuck in [place] due to the coronavirus pandemic.  He finally got word that she will come home on a charter flight tomorrow but will need to be quarantined 14 days in [place], [place].  He is requesting a prescription for Cialis 20 mg that he is taking for erectile dysfunction as he got a coupon from [facility] that he would like to use.   Past Medical/Surgical History: Past Medical History:  Diagnosis Date  . ALLERGIC RHINITIS 07/13/2007  . HYPERLIPIDEMIA 07/13/2007  . HYPERTENSION 07/13/2007  . 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 PEPTIC ULCER DISEASE 07/13/2007  . 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 COLONOSCOPY WITH PROPOFOL N/A 02/17/2019   Procedure: COLONOSCOPY WITH PROPOFOL;  Surgeon: Ronnette Juniper, MD;  Location: [facility];  Service: Gastroenterology;  Laterality: N/A;  . IR ANGIOGRAM VISCERAL SELECTIVE  02/16/2019  . IR ANGIOGRAM VISCERAL SELECTIVE  02/16/2019  . IR ANGIOGRAM VISCERAL SELECTIVE  02/16/2019  . IR US GUIDE VASC ACCESS RIGHT  02/16/2019  . ROTATOR CUFF REPAIR      Social History:  reports that he has never smoked. He has never used smokeless tobacco. He reports current alcohol use. He reports that he does not use drugs.  Allergies: Allergies  Allergen Reactions  . Lisinopril Cough    Cough  . Tramadol Hcl     REACTION: unspecified    Family History:  No history of heart disease or stroke that he is aware of.  Current Outpatient Medications:  .  Ascorbic Acid (VITAMIN C PO), Take 1 tablet by mouth daily., Disp: , Rfl:  .  diphenhydrAMINE (BENADRYL) 25 MG tablet, Take 25 mg by mouth daily as needed for allergies., Disp: , Rfl:  .  fluticasone (FLONASE) 50 MCG/ACT nasal spray, USE 2 SPRAYS IN EACH  NOSTRIL DAILY (Patient taking differently: USE 2 SPRAYS IN EACH  NOSTRIL DAILY AS NEEDED FOR ALLERGIES), Disp: 16 g, Rfl: 6 .  latanoprost (XALATAN) 0.005 % ophthalmic solution, Place 1 drop into both eyes at bedtime., Disp: 7.5 mL, Rfl: 1 .  levocetirizine (XYZAL) 5 MG tablet, TAKE 1 TABLET BY  MOUTH  EVERY EVENING, Disp: 90 tablet, Rfl: 2 .  losartan (COZAAR) 100 MG tablet, TAKE 1 TABLET BY MOUTH  DAILY (Patient taking differently: Take 100 mg by mouth daily. ), Disp: 90 tablet, Rfl: 3 .  MAGNESIUM PO, Take 1 tablet by mouth daily., Disp: , Rfl:  .  omeprazole (PRILOSEC) 20 MG capsule, TAKE 1 CAPSULE BY MOUTH  DAILY (Patient taking differently: Take 20 mg by mouth daily. ), Disp: 90 capsule, Rfl: 1 .  pantoprazole (PROTONIX) 40 MG tablet, Take 1 tablet (40 mg total) by mouth daily., Disp: 30 tablet, Rfl: 1 .  psyllium  (HYDROCIL/METAMUCIL) 95 % PACK, Take 1 packet by mouth 2 (two) times daily., Disp: 60 each, Rfl: 1 .  Testosterone Cypionate 100 MG/ML SOLN, Inject 100 mg as directed every 14 (fourteen) days., Disp: 1 vial, Rfl: 1 .  tadalafil (CIALIS) 20 MG tablet, Take 1 tablet (20 mg total) by mouth daily as needed for erectile dysfunction., Disp: 30 tablet, Rfl: 0  Current Facility-Administered Medications:  .  testosterone cypionate (DEPOTESTOTERONE CYPIONATE) injection 100 mg, 100 mg, Intramuscular, Q14 Days, Isaac Bliss, Rayford Halsted, MD, 100 mg at 12/08/18 7846  Review of Systems:  Constitutional: Denies fever, chills, diaphoresis, appetite change and fatigue.  HEENT: Denies photophobia, eye pain, redness, hearing loss, ear pain, congestion, sore throat, rhinorrhea, sneezing, mouth sores, trouble swallowing, neck pain, neck stiffness and tinnitus.   Respiratory: Denies SOB, DOE, cough, chest tightness,  and wheezing.   Cardiovascular: Denies chest pain, palpitations and leg swelling.  Gastrointestinal: Denies nausea, vomiting, abdominal pain, diarrhea, constipation, blood in stool and abdominal distention.  Genitourinary: Denies dysuria, urgency, frequency, hematuria, flank pain and difficulty urinating.  Endocrine: Denies: hot or cold intolerance, sweats, changes in hair or nails, polyuria, polydipsia. Musculoskeletal: Denies myalgias, back pain, joint swelling, arthralgias and gait problem.  Skin: Denies pallor, rash and wound.  Neurological: Denies dizziness, seizures, syncope, weakness, light-headedness, numbness and headaches.  Hematological: Denies adenopathy. Easy bruising, personal or family bleeding history  Psychiatric/Behavioral: Denies suicidal ideation, mood changes, confusion, nervousness, sleep disturbance and agitation    Physical Exam: Vitals:   02/23/19 0753  BP: 110/80  Pulse: 81  Temp: 97.9 F (36.6 C)  TempSrc: Oral  SpO2: 98%  Weight: 181 lb 4.8 oz (82.2 kg)    Body  mass index is 31.36 kg/m.   Constitutional: NAD, calm, comfortable, looks pale Eyes: PERRL, lids and conjunctivae normal ENMT: Mucous membranes are moist. Posterior pharynx clear of any exudate or lesions. Normal dentition. Tympanic membrane is pearly white, no erythema or bulging. Neck: normal, supple, no masses, no thyromegaly Respiratory: clear to auscultation bilaterally, no wheezing, no crackles. Normal respiratory effort. No accessory muscle use.  Cardiovascular: Regular rate and rhythm, no murmurs / rubs / gallops. No extremity edema. 2+ pedal pulses. No carotid bruits.  Abdomen: no tenderness, no masses palpated. No hepatosplenomegaly. Bowel sounds positive.  Musculoskeletal: no clubbing / cyanosis. No joint deformity upper and lower extremities. Good ROM, no contractures. Normal muscle tone.  Skin: no rashes, lesions, ulcers. No induration, looks pale Psychiatric: Normal judgment and insight. Alert and oriented x 3. Normal mood.    Impression and Plan:  Hospital discharge follow-up Acute GI bleeding  -Hospital chart reviewed in detail, he states he is doing well since discharge without further GI bleeding. -CBC today to follow-up post discharge hemoglobin of 8.1. -He has ceased drinking alcohol and taking NSAIDs.  Erectile dysfunction of organic origin -Prescription for Cialis given.   Time  spent: 27 minutes. Greater than 50% of this time was spent in direct contact with the patient, coordinating care and discussing relevant ongoing clinical issues, including hospital course, state post hospitalization, counseling.    Patient Instructions  -Nice seeing you today!!  -Labwork, will notify you with results.  -Schedule follow up in 3 months.     Lelon Frohlich, MD [facility]

## 2019-02-23 NOTE — Patient Instructions (Signed)
-  Nice seeing you today!!  -Labwork, will notify you with results.  -Schedule follow up in 3 months.

## 2019-02-26 ENCOUNTER — Other Ambulatory Visit: Payer: Self-pay

## 2019-02-26 ENCOUNTER — Inpatient Hospital Stay: Payer: Self-pay | Admitting: Family Medicine

## 2019-02-26 NOTE — Patient Outreach (Signed)
[facility]) Care Management  02/26/2019  Khaled Herda [birth year] [id number]  EMMI: General discharge red alert Referral date: 02/26/19 Referral reason: lost interest in things: yes Insurance:  [place] health care Day # 4  Telephone call to patient regarding EMMI general discharge red alert. HIPAA verified with patient. Explained reason for call. Patient states he has not lost interest in things. He states he saw his primary md on 02/23/19.  Patient states his only complaint is he is tired. Patient states he is going to his office today.  RNCM advised patient to practice social distancing/ stay at home as per states government recommendations. Patient verbalized understanding. Patient states he is taking his medications as prescribed. He states he has not been able to set up an appointment with the gastroenterologist yet because they are not taking patients due to the [place] 19 virus.  Patient states he will call the office back within the next week or two to attempt to scheduled. Patient denies any new symptoms or concerns at this point.  Patient states he is able to drive to his appointment.  RNCM advised patient to notify MD of any changes in condition prior to scheduled appointment. RNCM provided contact name and number: 24 hour nurse advise line [phone].  RNCM verified patient aware of 911 services for urgent/ emergent needs. Patient verbalized understanding   PLAN: RNCM will close patient due to patient being assessed and having no further needs.   Quinn Plowman RN,BSN,CCM [facility] Telephonic  [phone]

## 2019-04-16 ENCOUNTER — Other Ambulatory Visit: Payer: Self-pay | Admitting: *Deleted

## 2019-04-16 MED ORDER — LOSARTAN POTASSIUM 100 MG PO TABS: 100.0000 mg | ORAL_TABLET | Freq: Every day | ORAL | 0 refills | Status: DC

## 2019-04-17 ENCOUNTER — Other Ambulatory Visit: Payer: Self-pay | Admitting: *Deleted

## 2019-04-26 ENCOUNTER — Other Ambulatory Visit: Payer: Self-pay

## 2019-04-26 ENCOUNTER — Encounter: Payer: Self-pay | Admitting: Internal Medicine

## 2019-04-26 ENCOUNTER — Ambulatory Visit (INDEPENDENT_AMBULATORY_CARE_PROVIDER_SITE_OTHER): Payer: Medicare Other | Admitting: Internal Medicine

## 2019-04-26 ENCOUNTER — Telehealth: Payer: Self-pay | Admitting: *Deleted

## 2019-04-26 VITALS — BP 130/80 | HR 66 | Temp 98.1°F | Wt 177.7 lb

## 2019-04-26 DIAGNOSIS — H66002 Acute suppurative otitis media without spontaneous rupture of ear drum, left ear: Secondary | ICD-10-CM | POA: Diagnosis not present

## 2019-04-26 MED ORDER — AMOXICILLIN-POT CLAVULANATE 875-125 MG PO TABS: 1.0000 | ORAL_TABLET | Freq: Two times a day (BID) | ORAL | 0 refills | Status: DC

## 2019-04-26 NOTE — Progress Notes (Signed)
Acute Office Visit     CC/Reason for Visit: Left ear pain  HPI: David Goodman is a 78 y.o. male who is coming in today for the above mentioned reasons. He started using a new hearing aid in his left ear about 2 weeks ago. For the past 2 days has had increasing pain in his left ear that he describes as throbbing, now has pain over his left jaw and forehead. No URI symptoms, no fever no ear discharge.   Past Medical/Surgical History: Past Medical History:  Diagnosis Date  . ALLERGIC RHINITIS 07/13/2007  . HYPERLIPIDEMIA 07/13/2007  . HYPERTENSION 07/13/2007  . 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 PEPTIC ULCER DISEASE 07/13/2007  . 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 COLONOSCOPY WITH PROPOFOL N/A 02/17/2019   Procedure: COLONOSCOPY WITH PROPOFOL;  Surgeon: Ronnette Juniper, MD;  Location: [facility];  Service: Gastroenterology;  Laterality: N/A;  . IR ANGIOGRAM VISCERAL SELECTIVE  02/16/2019  . IR ANGIOGRAM VISCERAL SELECTIVE  02/16/2019  . IR ANGIOGRAM VISCERAL SELECTIVE  02/16/2019  . IR US GUIDE VASC ACCESS RIGHT  02/16/2019  . ROTATOR CUFF REPAIR      Social History:  reports that he has never smoked. He has never used smokeless tobacco. He reports current alcohol use. He reports that he does not use drugs.  Allergies: Allergies  Allergen Reactions  . Lisinopril Cough    Cough  . Tramadol Hcl     REACTION: unspecified    Family History:  No h/o heart disease, cancer or stroke that he is aware of.  Current Outpatient Medications:  .  Ascorbic Acid (VITAMIN C PO), Take 1 tablet by mouth daily., Disp: , Rfl:  .  diphenhydrAMINE (BENADRYL) 25 MG tablet, Take 25 mg by mouth daily as needed for allergies., Disp: , Rfl:  .  fluticasone (FLONASE) 50 MCG/ACT nasal spray, USE 2 SPRAYS IN EACH  NOSTRIL DAILY (Patient taking differently: USE 2 SPRAYS IN EACH  NOSTRIL DAILY AS NEEDED FOR ALLERGIES), Disp: 16 g, Rfl: 6 .   latanoprost (XALATAN) 0.005 % ophthalmic solution, Place 1 drop into both eyes at bedtime., Disp: 7.5 mL, Rfl: 1 .  levocetirizine (XYZAL) 5 MG tablet, TAKE 1 TABLET BY MOUTH  EVERY EVENING, Disp: 90 tablet, Rfl: 2 .  losartan (COZAAR) 100 MG tablet, Take 1 tablet (100 mg total) by mouth daily., Disp: 90 tablet, Rfl: 0 .  MAGNESIUM PO, Take 1 tablet by mouth daily., Disp: , Rfl:  .  omeprazole (PRILOSEC) 20 MG capsule, TAKE 1 CAPSULE BY MOUTH  DAILY (Patient taking differently: Take 20 mg by mouth daily. ), Disp: 90 capsule, Rfl: 1 .  pantoprazole (PROTONIX) 40 MG tablet, Take 1 tablet (40 mg total) by mouth daily., Disp: 30 tablet, Rfl: 1 .  psyllium (HYDROCIL/METAMUCIL) 95 % PACK, Take 1 packet by mouth 2 (two) times daily., Disp: 60 each, Rfl: 1 .  tadalafil (CIALIS) 20 MG tablet, Take 1 tablet (20 mg total) by mouth daily as needed for erectile dysfunction., Disp: 30 tablet, Rfl: 0 .  Testosterone Cypionate 100 MG/ML SOLN, Inject 100 mg as directed every 14 (fourteen) days., Disp: 1 vial, Rfl: 1 .  amoxicillin-clavulanate (AUGMENTIN) 875-125 MG tablet, Take 1 tablet by mouth 2 (two) times daily for 7 days., Disp: 14 tablet, Rfl: 0 .  testosterone cypionate (DEPOTESTOTERONE CYPIONATE) 100 MG/ML injection, INJECT 100 MG (1 ML) AS DIRECTED EVERY 14 DAYS, Disp: , Rfl:  Current Facility-Administered Medications:  .  testosterone cypionate (DEPOTESTOTERONE CYPIONATE) injection 100 mg, 100 mg, Intramuscular, Q14 Days, Isaac Bliss, Rayford Halsted, MD, 100 mg at 12/08/18 2595  Review of Systems:  Constitutional: Denies fever, chills, diaphoresis, appetite change and fatigue.  HEENT: Denies photophobia, eye pain, redness, congestion, sore throat, rhinorrhea, sneezing, mouth sores, trouble swallowing, neck pain, neck stiffness and tinnitus.   Respiratory: Denies SOB, DOE, cough, chest tightness,  and wheezing.   Cardiovascular: Denies chest pain, palpitations and leg swelling.  Gastrointestinal: Denies  nausea, vomiting, abdominal pain, diarrhea, constipation, blood in stool and abdominal distention.  Genitourinary: Denies dysuria, urgency, frequency, hematuria, flank pain and difficulty urinating.  Endocrine: Denies: hot or cold intolerance, sweats, changes in hair or nails, polyuria, polydipsia. Musculoskeletal: Denies myalgias, back pain, joint swelling, arthralgias and gait problem.  Skin: Denies pallor, rash and wound.  Neurological: Denies dizziness, seizures, syncope, weakness, light-headedness, numbness and headaches.  Hematological: Denies adenopathy. Easy bruising, personal or family bleeding history  Psychiatric/Behavioral: Denies suicidal ideation, mood changes, confusion, nervousness, sleep disturbance and agitation    Physical Exam: Vitals:   04/26/19 1059  BP: 130/80  Pulse: 66  Temp: 98.1 F (36.7 C)  TempSrc: Oral  SpO2: 97%  Weight: 177 lb 11.2 oz (80.6 kg)    Body mass index is 30.74 kg/m.   Constitutional: NAD, calm, comfortable Eyes: PERRL, lids and conjunctivae normal ENMT: Mucous membranes are moist. Posterior pharynx clear of any exudate or lesions. Normal dentition. Tympanic membrane is pearly white, no erythema or bulging on the right; on the left there is redness of the TM and it is bulging with an air-fluid level. Respiratory: clear to auscultation bilaterally, no wheezing, no crackles. Normal respiratory effort. No accessory muscle use.  Psychiatric: Normal judgment and insight. Alert and oriented x 3. Normal mood.    Impression and Plan:  Non-recurrent acute suppurative otitis media of left ear without spontaneous rupture of tympanic membrane  -Clear left ear infection on exam. -Augmentin BID for 7 days. -RTC in 14 days if no improvement.   Patient Instructions  -Gusto de verlo hoy!  -Tomese el Augmentin 1 tableta 2 veces por dia por 7 dias para la infeccion del oido.   Otitis media en los adultos Otitis Media, Adult  Otitis media  significa que el odo medio est rojo e hinchado (inflamado) y lleno de lquido. Generalmente, la afeccin desaparece sin tratamiento. Siga estas indicaciones en su casa:  Tome los medicamentos de venta libre y los recetados solamente como se lo haya indicado el mdico.  Si le recetaron un antibitico, tmelo como se lo haya indicado el mdico. No deje de tomar el antibitico aunque comience a sentirse mejor.  Concurra a todas las visitas de control como se lo haya indicado el mdico. Esto es importante. Comunquese con un mdico si:  Le sangra la nariz.  Tiene un bulto en el cuello.  No mejora luego de 5das.  Empeora en lugar de mejorar. Solicite ayuda de inmediato si:  Siente dolor que no se [employer].  Tiene hinchazn, enrojecimiento o dolor en el odo.  Tiene rigidez en el cuello.  No puede mover una parte de la cara (parlisis).  Nota que el hueso que se encuentra detrs de la oreja le duele al tocarlo.  Siente un dolor de cabeza muy intenso. Resumen  Otitis media significa que el odo medio est rojo, hinchado y lleno de lquido.  Generalmente, esta afeccin desaparece sin tratamiento. En algunos casos, puede  no ser necesario el tratamiento.  Si le recetaron un antibitico, tmelo como se lo haya indicado el mdico. Esta informacin no tiene Marine scientist el consejo del mdico. Asegrese de hacerle al mdico cualquier pregunta que tenga. Document Released: 12/18/2010 Document Revised: 08/25/2017 Document Reviewed: 06/12/2013 Elsevier Interactive Patient Education  2019 [place], MD [facility]

## 2019-04-26 NOTE — Patient Instructions (Signed)
-  Gusto de verlo hoy!  -Tomese el Augmentin 1 tableta 2 veces por dia por 7 dias para la infeccion del oido.   Otitis media en los adultos Otitis Media, Adult  Otitis media significa que el odo medio est rojo e hinchado (inflamado) y lleno de lquido. Generalmente, la afeccin desaparece sin tratamiento. Siga estas indicaciones en su casa:  Tome los medicamentos de venta libre y los recetados solamente como se lo haya indicado el mdico.  Si le recetaron un antibitico, tmelo como se lo haya indicado el mdico. No deje de tomar el antibitico aunque comience a sentirse mejor.  Concurra a todas las visitas de control como se lo haya indicado el mdico. Esto es importante. Comunquese con un mdico si:  Le sangra la nariz.  Tiene un bulto en el cuello.  No mejora luego de 5das.  Empeora en lugar de mejorar. Solicite ayuda de inmediato si:  Siente dolor que no se [employer].  Tiene hinchazn, enrojecimiento o dolor en el odo.  Tiene rigidez en el cuello.  No puede mover una parte de la cara (parlisis).  Nota que el hueso que se encuentra detrs de la oreja le duele al tocarlo.  Siente un dolor de cabeza muy intenso. Resumen  Otitis media significa que el odo medio est rojo, hinchado y lleno de lquido.  Generalmente, esta afeccin desaparece sin tratamiento. En algunos casos, puede no ser [employer].  Si le recetaron un antibitico, tmelo como se lo haya indicado el mdico. Esta informacin no tiene Marine scientist el consejo del mdico. Asegrese de hacerle al mdico cualquier pregunta que tenga. Document Released: 12/18/2010 Document Revised: 08/25/2017 Document Reviewed: 06/12/2013 Elsevier Interactive Patient Education  [employer].

## 2019-04-26 NOTE — Telephone Encounter (Signed)
Copied from [place] [id number]. Topic: Appointment Scheduling - Scheduling Inquiry for Clinic >> Apr 26, 2019  7:49 AM Rayann Heman wrote: Reason for CRM: pt called and state that he needs to schedule an appointment for ear pain because of hearing aid. Please advise >> Apr 26, 2019  8:15 AM Cox, Melburn Hake, CMA wrote: Not sure if this needs to be virtual or in person?  Left message on machine for patient to return our call to schedule an in office visit.

## 2019-04-30 ENCOUNTER — Encounter: Payer: Self-pay | Admitting: Internal Medicine

## 2019-04-30 DIAGNOSIS — H2513 Age-related nuclear cataract, bilateral: Secondary | ICD-10-CM | POA: Diagnosis not present

## 2019-04-30 DIAGNOSIS — H401122 Primary open-angle glaucoma, left eye, moderate stage: Secondary | ICD-10-CM | POA: Diagnosis not present

## 2019-04-30 DIAGNOSIS — H524 Presbyopia: Secondary | ICD-10-CM | POA: Diagnosis not present

## 2019-04-30 DIAGNOSIS — H25013 Cortical age-related cataract, bilateral: Secondary | ICD-10-CM | POA: Diagnosis not present

## 2019-04-30 DIAGNOSIS — H401111 Primary open-angle glaucoma, right eye, mild stage: Secondary | ICD-10-CM | POA: Diagnosis not present

## 2019-05-02 ENCOUNTER — Encounter: Payer: Self-pay | Admitting: Internal Medicine

## 2019-05-02 ENCOUNTER — Other Ambulatory Visit: Payer: Self-pay

## 2019-05-02 ENCOUNTER — Ambulatory Visit (INDEPENDENT_AMBULATORY_CARE_PROVIDER_SITE_OTHER): Payer: Medicare Other | Admitting: Internal Medicine

## 2019-05-02 VITALS — BP 110/80 | HR 72 | Temp 98.4°F | Wt 177.3 lb

## 2019-05-02 DIAGNOSIS — R202 Paresthesia of skin: Secondary | ICD-10-CM

## 2019-05-02 DIAGNOSIS — E349 Endocrine disorder, unspecified: Secondary | ICD-10-CM | POA: Diagnosis not present

## 2019-05-02 DIAGNOSIS — K5793 Diverticulitis of intestine, part unspecified, without perforation or abscess with bleeding: Secondary | ICD-10-CM

## 2019-05-02 DIAGNOSIS — D62 Acute posthemorrhagic anemia: Secondary | ICD-10-CM | POA: Diagnosis not present

## 2019-05-02 DIAGNOSIS — R5383 Other fatigue: Secondary | ICD-10-CM

## 2019-05-02 DIAGNOSIS — H66002 Acute suppurative otitis media without spontaneous rupture of ear drum, left ear: Secondary | ICD-10-CM | POA: Diagnosis not present

## 2019-05-02 LAB — COMPREHENSIVE METABOLIC PANEL
ALT: 22 U/L (ref 0–53)
AST: 15 U/L (ref 0–37)
Albumin: 4.4 g/dL (ref 3.5–5.2)
Alkaline Phosphatase: 48 U/L (ref 39–117)
BUN: 25 mg/dL — ABNORMAL HIGH (ref 6–23)
CO2: 27 mEq/L (ref 19–32)
Calcium: 9.1 mg/dL (ref 8.4–10.5)
Chloride: 100 mEq/L (ref 96–112)
Creatinine, Ser: 0.98 mg/dL (ref 0.40–1.50)
GFR: 74.05 mL/min (ref >60.00)
Glucose, Bld: 94 mg/dL (ref 70–99)
Potassium: 4.1 mEq/L (ref 3.5–5.1)
Sodium: 136 mEq/L (ref 135–145)
Total Bilirubin: 0.4 mg/dL (ref 0.2–1.2)
Total Protein: 6.7 g/dL (ref 6.0–8.3)

## 2019-05-02 LAB — CBC WITH DIFFERENTIAL/PLATELET
Basophils Absolute: 0 10*3/uL (ref 0.0–0.1)
Basophils Relative: 0.4 % (ref 0.0–3.0)
Eosinophils Absolute: 0.8 10*3/uL — ABNORMAL HIGH (ref 0.0–0.7)
Eosinophils Relative: 9.9 % — ABNORMAL HIGH (ref 0.0–5.0)
HCT: 41.3 % (ref 39.0–52.0)
Hemoglobin: 14.2 g/dL (ref 13.0–17.0)
Lymphocytes Relative: 15.5 % (ref 12.0–46.0)
Lymphs Abs: 1.2 10*3/uL (ref 0.7–4.0)
MCHC: 34.4 g/dL (ref 30.0–36.0)
MCV: 86.5 fl (ref 78.0–100.0)
Monocytes Absolute: 0.5 10*3/uL (ref 0.1–1.0)
Monocytes Relative: 5.9 % (ref 3.0–12.0)
Neutro Abs: 5.4 10*3/uL (ref 1.4–7.7)
Neutrophils Relative %: 68.3 % (ref 43.0–77.0)
Platelets: 239 10*3/uL (ref 150.0–400.0)
RBC: 4.78 Mil/uL (ref 4.22–5.81)
RDW: 15.2 % (ref 11.5–15.5)
WBC: 7.9 10*3/uL (ref 4.0–10.5)

## 2019-05-02 LAB — VITAMIN B12: Vitamin B-12: 1275 pg/mL — ABNORMAL HIGH (ref 211–911)

## 2019-05-02 LAB — TSH: TSH: 3.04 u[IU]/mL (ref 0.35–4.50)

## 2019-05-02 LAB — VITAMIN D 25 HYDROXY (VIT D DEFICIENCY, FRACTURES): VITD: 36.47 ng/mL (ref 30.00–100.00)

## 2019-05-02 MED ORDER — AMOXICILLIN-POT CLAVULANATE 875-125 MG PO TABS: 1.0000 | ORAL_TABLET | Freq: Two times a day (BID) | ORAL | 0 refills | Status: AC

## 2019-05-02 NOTE — Patient Instructions (Addendum)
-  5 dias mas de augmentin.  -Pruebas de sangre hoy; le aviso con los [place].  -Si no mejora el oido, aviseme y la hacemos un referimiento a un especialista de oidos.

## 2019-05-02 NOTE — Progress Notes (Signed)
Established Patient Office Visit     CC/Reason for Visit: Follow-up ear pain, fatigue and tingling of extremities  HPI: David Goodman is a 78 y.o. male who is coming in today for the above mentioned reasons.  He was seen on May 28 for ear pain and was diagnosed with a left otitis media.  Was given an 8-day course of Augmentin.  He comes in today 6 days into his treatment, ear pain is still present, he still has jaw and forehead pain as well.  He denies nasal congestion.  Denies teeth pain.  He also tells me today that he has been having fatigue and tingling of his legs.  He was hospitalized in March with an acute GI bleed and would like to have his hemoglobin levels rechecked.   Past Medical/Surgical History: Past Medical History:  Diagnosis Date  . ALLERGIC RHINITIS 07/13/2007  . HYPERLIPIDEMIA 07/13/2007  . HYPERTENSION 07/13/2007  . 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 PEPTIC ULCER DISEASE 07/13/2007  . 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 COLONOSCOPY WITH PROPOFOL N/A 02/17/2019   Procedure: COLONOSCOPY WITH PROPOFOL;  Surgeon: Ronnette Juniper, MD;  Location: [facility];  Service: Gastroenterology;  Laterality: N/A;  . IR ANGIOGRAM VISCERAL SELECTIVE  02/16/2019  . IR ANGIOGRAM VISCERAL SELECTIVE  02/16/2019  . IR ANGIOGRAM VISCERAL SELECTIVE  02/16/2019  . IR US GUIDE VASC ACCESS RIGHT  02/16/2019  . ROTATOR CUFF REPAIR      Social History:  reports that he has never smoked. He has never used smokeless tobacco. He reports current alcohol use. He reports that he does not use drugs.  Allergies: Allergies  Allergen Reactions  . Lisinopril Cough    Cough  . Tramadol Hcl     REACTION: unspecified    Family History:  No history of heart disease, stroke, cancer that he is aware of.  Current Outpatient Medications:  .  Ascorbic Acid (VITAMIN C PO), Take 1 tablet by mouth daily., Disp: , Rfl:  .  diphenhydrAMINE  (BENADRYL) 25 MG tablet, Take 25 mg by mouth daily as needed for allergies., Disp: , Rfl:  .  fluticasone (FLONASE) 50 MCG/ACT nasal spray, USE 2 SPRAYS IN EACH  NOSTRIL DAILY (Patient taking differently: USE 2 SPRAYS IN EACH  NOSTRIL DAILY AS NEEDED FOR ALLERGIES), Disp: 16 g, Rfl: 6 .  latanoprost (XALATAN) 0.005 % ophthalmic solution, Place 1 drop into both eyes at bedtime., Disp: 7.5 mL, Rfl: 1 .  levocetirizine (XYZAL) 5 MG tablet, TAKE 1 TABLET BY MOUTH  EVERY EVENING, Disp: 90 tablet, Rfl: 2 .  losartan (COZAAR) 100 MG tablet, Take 1 tablet (100 mg total) by mouth daily., Disp: 90 tablet, Rfl: 0 .  MAGNESIUM PO, Take 1 tablet by mouth daily., Disp: , Rfl:  .  omeprazole (PRILOSEC) 20 MG capsule, TAKE 1 CAPSULE BY MOUTH  DAILY (Patient taking differently: Take 20 mg by mouth daily. ), Disp: 90 capsule, Rfl: 1 .  pantoprazole (PROTONIX) 40 MG tablet, Take 1 tablet (40 mg total) by mouth daily., Disp: 30 tablet, Rfl: 1 .  psyllium (HYDROCIL/METAMUCIL) 95 % PACK, Take 1 packet by mouth 2 (two) times daily., Disp: 60 each, Rfl: 1 .  tadalafil (CIALIS) 20 MG tablet, Take 1 tablet (20 mg total) by mouth daily as needed for erectile dysfunction., Disp: 30 tablet, Rfl: 0 .  testosterone cypionate (DEPOTESTOTERONE CYPIONATE) 100 MG/ML injection, INJECT 100 MG (1 ML) AS DIRECTED  EVERY 14 DAYS, Disp: , Rfl:  .  Testosterone Cypionate 100 MG/ML SOLN, Inject 100 mg as directed every 14 (fourteen) days., Disp: 1 vial, Rfl: 1 .  amoxicillin-clavulanate (AUGMENTIN) 875-125 MG tablet, Take 1 tablet by mouth 2 (two) times daily for 5 days., Disp: 10 tablet, Rfl: 0  Current Facility-Administered Medications:  .  testosterone cypionate (DEPOTESTOTERONE CYPIONATE) injection 100 mg, 100 mg, Intramuscular, Q14 Days, Isaac Bliss, Rayford Halsted, MD, 100 mg at 12/08/18 0017  Review of Systems:  Constitutional: Denies fever, chills, diaphoresis, appetite change and fatigue.  HEENT: Denies photophobia, eye pain,  redness, hearing loss, congestion, sore throat, rhinorrhea, sneezing, mouth sores, trouble swallowing, neck pain, neck stiffness and tinnitus.   Respiratory: Denies SOB, DOE, cough, chest tightness,  and wheezing.   Cardiovascular: Denies chest pain, palpitations and leg swelling.  Gastrointestinal: Denies nausea, vomiting, abdominal pain, diarrhea, constipation, blood in stool and abdominal distention.  Genitourinary: Denies dysuria, urgency, frequency, hematuria, flank pain and difficulty urinating.  Endocrine: Denies: hot or cold intolerance, sweats, changes in hair or nails, polyuria, polydipsia. Musculoskeletal: Denies myalgias, back pain, joint swelling, arthralgias and gait problem.  Skin: Denies pallor, rash and wound.  Neurological: Denies dizziness, seizures, syncope, weakness, light-headedness,and headaches.  Hematological: Denies adenopathy. Easy bruising, personal or family bleeding history  Psychiatric/Behavioral: Denies suicidal ideation, mood changes, confusion, nervousness, sleep disturbance and agitation    Physical Exam: Vitals:   05/02/19 0754  BP: 110/80  Pulse: 72  Temp: 98.4 F (36.9 C)  TempSrc: Oral  SpO2: 95%  Weight: 177 lb 4.8 oz (80.4 kg)    Body mass index is 30.67 kg/m.   Constitutional: NAD, calm, comfortable Eyes: PERRL, lids and conjunctivae normal ENMT: Mucous membranes are moist. Posterior pharynx clear of any exudate or lesions. Normal dentition. Tympanic membrane is pearly white, no erythema or bulging on the right, on the left still with some mild erythema and bulging but no longer an air-fluid level. Neck: normal, supple, no masses, no thyromegaly Respiratory: clear to auscultation bilaterally, no wheezing, no crackles. Normal respiratory effort. No accessory muscle use.  Cardiovascular: Regular rate and rhythm, no murmurs / rubs / gallops. No extremity edema. 2+ pedal pulses. No carotid bruits.  Abdomen: no tenderness, no masses palpated.  No hepatosplenomegaly. Bowel sounds positive.  Musculoskeletal: no clubbing / cyanosis. No joint deformity upper and lower extremities. Good ROM, no contractures. Normal muscle tone.  Psychiatric: Normal judgment and insight. Alert and oriented x 3. Normal mood.    Impression and Plan:  Non-recurrent acute suppurative otitis media of left ear without spontaneous rupture of tympanic membrane  -Will extend Augmentin and extra 5 days, if no significant improvement consider ENT referral. -He denies nasal congestion or any other URI symptoms.  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  Fatigue, unspecified type  Tingling of both feet  -Check TSH, B12, vitamin D, CBC, comprehensive metabolic profile as well as testosterone levels.  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    Patient Instructions  -5 dias mas de augmentin.  -Pruebas de sangre hoy; le aviso con los [place].  -Si no mejora el oido, aviseme y la hacemos un referimiento a un especialista de oidos.     Lelon Frohlich, MD [facility]

## 2019-05-07 LAB — TESTOSTERONE, FREE & TOTAL
Free Testosterone: 35.9 pg/mL (ref 30.0–135.0)
Testosterone, Total, LC-MS-MS: 226 ng/dL — ABNORMAL LOW (ref 250–1100)

## 2019-05-14 ENCOUNTER — Ambulatory Visit (INDEPENDENT_AMBULATORY_CARE_PROVIDER_SITE_OTHER): Payer: Medicare Other

## 2019-05-14 ENCOUNTER — Other Ambulatory Visit: Payer: Self-pay

## 2019-05-14 DIAGNOSIS — E349 Endocrine disorder, unspecified: Secondary | ICD-10-CM | POA: Diagnosis not present

## 2019-05-14 NOTE — Progress Notes (Signed)
Per orders of Dr. Jacques Earthly, injection of Testosterone given by Rebecca Eaton. Patient tolerated injection well.  Please advise in the absence of Dr. Jerilee Hoh.

## 2019-05-18 ENCOUNTER — Telehealth: Payer: Self-pay | Admitting: *Deleted

## 2019-05-23 MED ORDER — OMEPRAZOLE 20 MG PO CPDR: 20.0000 mg | DELAYED_RELEASE_CAPSULE | Freq: Every day | ORAL | 1 refills | Status: DC

## 2019-05-23 MED ORDER — FLUTICASONE PROPIONATE 50 MCG/ACT NA SUSP: 2.0000 | Freq: Every day | NASAL | 6 refills | Status: DC

## 2019-05-23 NOTE — Telephone Encounter (Signed)
Refill sent.

## 2019-05-23 NOTE — Telephone Encounter (Signed)
Pt following up on this refill request, sent 6/19. Please advise.

## 2019-05-28 ENCOUNTER — Ambulatory Visit (INDEPENDENT_AMBULATORY_CARE_PROVIDER_SITE_OTHER): Payer: Medicare Other | Admitting: *Deleted

## 2019-05-28 ENCOUNTER — Other Ambulatory Visit: Payer: Self-pay

## 2019-05-28 DIAGNOSIS — E349 Endocrine disorder, unspecified: Secondary | ICD-10-CM | POA: Diagnosis not present

## 2019-05-28 MED ORDER — TESTOSTERONE CYPIONATE 100 MG/ML IM SOLN
100.0000 mg | INTRAMUSCULAR | Status: DC
  Administered 2019-05-28 – 2019-07-16 (×2): 100 mg via INTRAMUSCULAR

## 2019-06-14 NOTE — Progress Notes (Signed)
Per orders of Dr. Jerilee Hoh, injection of Testosterone  given by Denna Haggard. Patient tolerated injection well.

## 2019-07-11 ENCOUNTER — Other Ambulatory Visit: Payer: Self-pay | Admitting: Internal Medicine

## 2019-07-16 ENCOUNTER — Other Ambulatory Visit: Payer: Self-pay

## 2019-07-16 ENCOUNTER — Ambulatory Visit (INDEPENDENT_AMBULATORY_CARE_PROVIDER_SITE_OTHER): Payer: Medicare Other | Admitting: *Deleted

## 2019-07-16 ENCOUNTER — Ambulatory Visit: Payer: Medicare Other

## 2019-07-16 ENCOUNTER — Telehealth: Payer: Self-pay | Admitting: Internal Medicine

## 2019-07-16 DIAGNOSIS — E349 Endocrine disorder, unspecified: Secondary | ICD-10-CM | POA: Diagnosis not present

## 2019-07-16 NOTE — Progress Notes (Signed)
Patient in for Testosterone injection. Injection administered with no reactions.

## 2019-07-16 NOTE — Telephone Encounter (Signed)
The patient said that he is taking Losartan and is wanting to talk to the nurse to contact him to discuss the side effects that he is having with this medication.  For this particular medication he wants to change Pharmacy to:  [facility], [place] [phone] (Phone) [phone] (Fax)      Please Advise

## 2019-07-18 NOTE — Telephone Encounter (Signed)
Virtual visit scheduled.  

## 2019-07-18 NOTE — Telephone Encounter (Signed)
Patient is complaining of a cough in the mornings and dizziness.  His blood pressure has been unstable.  He would like to know if can try a different medication?

## 2019-07-18 NOTE — Telephone Encounter (Signed)
Can we schedule a virtual to discuss?

## 2019-07-20 ENCOUNTER — Other Ambulatory Visit: Payer: Self-pay

## 2019-07-20 ENCOUNTER — Telehealth (INDEPENDENT_AMBULATORY_CARE_PROVIDER_SITE_OTHER): Payer: Medicare Other | Admitting: Internal Medicine

## 2019-07-20 DIAGNOSIS — R252 Cramp and spasm: Secondary | ICD-10-CM

## 2019-07-20 DIAGNOSIS — I10 Essential (primary) hypertension: Secondary | ICD-10-CM

## 2019-07-20 MED ORDER — LOSARTAN POTASSIUM 100 MG PO TABS: 100.0000 mg | ORAL_TABLET | Freq: Every day | ORAL | 1 refills | Status: DC

## 2019-07-20 MED ORDER — POTASSIUM CHLORIDE CRYS ER 20 MEQ PO TBCR: 20.0000 meq | EXTENDED_RELEASE_TABLET | Freq: Two times a day (BID) | ORAL | 0 refills | Status: DC

## 2019-07-20 MED ORDER — MAGNESIUM OXIDE 400 MG PO CAPS: 1.0000 | ORAL_CAPSULE | Freq: Two times a day (BID) | ORAL | 0 refills | Status: AC

## 2019-07-20 NOTE — Progress Notes (Signed)
Virtual Visit via Video Note  I connected with David Goodman on 07/20/19 at  3:00 PM EDT by a video enabled telemedicine application and verified that I am speaking with the correct person using two identifiers.  Location patient: home Location provider: work office Persons participating in the virtual visit: patient, provider  I discussed the limitations of evaluation and management by telemedicine and the availability of in person appointments. The patient expressed understanding and agreed to proceed.   HPI: He has scheduled this visit to discuss some acute issues.  1.  He is not sure what happened with his mail order pharmacy but he states that when he reordered his losartan he was instead given losartan/HCTZ, he does not feel "that this medicine agrees with him" but cannot specify why, states his blood pressure goes up and down but cannot give me exact numbers.  2.  He has been having cramps in his thighs and calves, this coincides with the combination losartan/HCTZ being sent to him.  On another note he had a history of significant alcohol abuse, states he has completely quit drinking at this time.   ROS: Constitutional: Denies fever, chills, diaphoresis, appetite change and fatigue.  HEENT: Denies photophobia, eye pain, redness, hearing loss, ear pain, congestion, sore throat, rhinorrhea, sneezing, mouth sores, trouble swallowing, neck pain, neck stiffness and tinnitus.   Respiratory: Denies SOB, DOE, cough, chest tightness,  and wheezing.   Cardiovascular: Denies chest pain, palpitations and leg swelling.  Gastrointestinal: Denies nausea, vomiting, abdominal pain, diarrhea, constipation, blood in stool and abdominal distention.  Genitourinary: Denies dysuria, urgency, frequency, hematuria, flank pain and difficulty urinating.  Endocrine: Denies: hot or cold intolerance, sweats, changes in hair or nails, polyuria, polydipsia. Musculoskeletal: Denies myalgias, back pain, joint  swelling, arthralgias and gait problem.  Skin: Denies pallor, rash and wound.  Neurological: Denies dizziness, seizures, syncope, weakness, light-headedness, numbness and headaches.  Hematological: Denies adenopathy. Easy bruising, personal or family bleeding history  Psychiatric/Behavioral: Denies suicidal ideation, mood changes, confusion, nervousness, sleep disturbance and agitation   Past Medical History:  Diagnosis Date  . ALLERGIC RHINITIS 07/13/2007  . HYPERLIPIDEMIA 07/13/2007  . HYPERTENSION 07/13/2007  . 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 PEPTIC ULCER DISEASE 07/13/2007  . 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 COLONOSCOPY WITH PROPOFOL N/A 02/17/2019   Procedure: COLONOSCOPY WITH PROPOFOL;  Surgeon: Ronnette Juniper, MD;  Location: [facility];  Service: Gastroenterology;  Laterality: N/A;  . IR ANGIOGRAM VISCERAL SELECTIVE  02/16/2019  . IR ANGIOGRAM VISCERAL SELECTIVE  02/16/2019  . IR ANGIOGRAM VISCERAL SELECTIVE  02/16/2019  . IR US GUIDE VASC ACCESS RIGHT  02/16/2019  . ROTATOR CUFF REPAIR      No family history on file.  SOCIAL HX:   reports that he has never smoked. He has never used smokeless tobacco. He reports current alcohol use. He reports that he does not use drugs.   Current Outpatient Medications:  .  Ascorbic Acid (VITAMIN C PO), Take 1 tablet by mouth daily., Disp: , Rfl:  .  diphenhydrAMINE (BENADRYL) 25 MG tablet, Take 25 mg by mouth daily as needed for allergies., Disp: , Rfl:  .  fluticasone (FLONASE) 50 MCG/ACT nasal spray, Place 2 sprays into both nostrils daily., Disp: 16 g, Rfl: 6 .  latanoprost (XALATAN) 0.005 % ophthalmic solution, Place 1 drop into both eyes at bedtime., Disp: 7.5 mL, Rfl: 1 .  levocetirizine (XYZAL) 5 MG tablet, TAKE 1  TABLET BY MOUTH  EVERY EVENING, Disp: 90 tablet, Rfl: 2 .  losartan (COZAAR) 100 MG tablet, Take 1 tablet (100 mg total) by mouth daily., Disp: 90 tablet,  Rfl: 1 .  Magnesium Oxide 400 MG CAPS, Take 1 capsule (400 mg total) by mouth 2 (two) times daily for 3 days., Disp: 6 capsule, Rfl: 0 .  MAGNESIUM PO, Take 1 tablet by mouth daily., Disp: , Rfl:  .  omeprazole (PRILOSEC) 20 MG capsule, Take 1 capsule (20 mg total) by mouth daily., Disp: 90 capsule, Rfl: 1 .  pantoprazole (PROTONIX) 40 MG tablet, Take 1 tablet (40 mg total) by mouth daily., Disp: 30 tablet, Rfl: 1 .  potassium chloride SA (K-DUR) 20 MEQ tablet, Take 1 tablet (20 mEq total) by mouth 2 (two) times daily for 3 days., Disp: 6 tablet, Rfl: 0 .  psyllium (HYDROCIL/METAMUCIL) 95 % PACK, Take 1 packet by mouth 2 (two) times daily., Disp: 60 each, Rfl: 1 .  tadalafil (CIALIS) 20 MG tablet, Take 1 tablet (20 mg total) by mouth daily as needed for erectile dysfunction., Disp: 30 tablet, Rfl: 0 .  testosterone cypionate (DEPOTESTOTERONE CYPIONATE) 100 MG/ML injection, INJECT 100 MG (1 ML) AS DIRECTED EVERY 14 DAYS, Disp: , Rfl:  .  Testosterone Cypionate 100 MG/ML SOLN, Inject 100 mg as directed every 14 (fourteen) days., Disp: 1 vial, Rfl: 1  Current Facility-Administered Medications:  .  testosterone cypionate (DEPOTESTOTERONE CYPIONATE) injection 100 mg, 100 mg, Intramuscular, Q14 Days, Isaac Bliss, Rayford Halsted, MD, 100 mg at 05/14/19 0952 .  testosterone cypionate (DEPOTESTOTERONE CYPIONATE) injection 100 mg, 100 mg, Intramuscular, Q14 Days, Isaac Bliss, Rayford Halsted, MD, 100 mg at 07/16/19 1051  EXAM:   VITALS per patient if applicable: None reported  GENERAL: alert, oriented, appears well and in no acute distress  HEENT: atraumatic, conjunttiva clear, no obvious abnormalities on inspection of external nose and ears  NECK: normal movements of the head and neck  LUNGS: on inspection no signs of respiratory distress, breathing rate appears normal, no obvious gross increased work of breathing, gasping or wheezing  CV: no obvious cyanosis  MS: moves all visible extremities  without noticeable abnormality  PSYCH/NEURO: pleasant and cooperative, no obvious depression or anxiety, speech and thought processing grossly intact  ASSESSMENT AND PLAN:   Essential hypertension  Leg cramps  -Wonder if his leg cramps may have anything to do with the addition of hydrochlorothiazide and hypokalemia. -He must have been sent a refill that remained of an old prescription as he is currently on losartan 100 mg only without the addition of hydrochlorothiazide 25 mg. -Will send prescription for losartan 100 mg today to his local [facility].  He will come in to see me in 6 to 8 weeks for blood pressure management. -As I suspect he may have some mild hypokalemia and also possibly hypomagnesemia due to the diuretic and his history of alcohol abuse, will give him a 3-day supply of potassium and magnesium.  If he continues to have cramps past the 3-day period,  would suggest he come in for evaluation and blood work.    I discussed the assessment and treatment plan with the patient. The patient was provided an opportunity to ask questions and all were answered. The patient agreed with the plan and demonstrated an understanding of the instructions.   The patient was advised to call back or seek an in-person evaluation if the symptoms worsen or if the condition fails to improve as anticipated.  Lelon Frohlich, MD  [facility]

## 2019-08-03 DIAGNOSIS — H2512 Age-related nuclear cataract, left eye: Secondary | ICD-10-CM | POA: Diagnosis not present

## 2019-08-03 DIAGNOSIS — H25013 Cortical age-related cataract, bilateral: Secondary | ICD-10-CM | POA: Diagnosis not present

## 2019-08-03 DIAGNOSIS — H401111 Primary open-angle glaucoma, right eye, mild stage: Secondary | ICD-10-CM | POA: Diagnosis not present

## 2019-08-03 DIAGNOSIS — H2513 Age-related nuclear cataract, bilateral: Secondary | ICD-10-CM | POA: Diagnosis not present

## 2019-08-03 DIAGNOSIS — H401122 Primary open-angle glaucoma, left eye, moderate stage: Secondary | ICD-10-CM | POA: Diagnosis not present

## 2019-08-10 ENCOUNTER — Ambulatory Visit (INDEPENDENT_AMBULATORY_CARE_PROVIDER_SITE_OTHER): Payer: Medicare Other | Admitting: *Deleted

## 2019-08-10 ENCOUNTER — Other Ambulatory Visit: Payer: Self-pay

## 2019-08-10 DIAGNOSIS — E349 Endocrine disorder, unspecified: Secondary | ICD-10-CM | POA: Diagnosis not present

## 2019-08-10 DIAGNOSIS — Z23 Encounter for immunization: Secondary | ICD-10-CM

## 2019-08-10 MED ORDER — TESTOSTERONE CYPIONATE 200 MG/ML IM SOLN
100.0000 mg | Freq: Once | INTRAMUSCULAR | Status: AC
  Administered 2019-08-10: 100 mg via INTRAMUSCULAR

## 2019-08-10 NOTE — Progress Notes (Signed)
Per orders of Dr. Ethlyn Gallery, injection of Testosterone cypoinate 100mg  given by Agnes Lawrence. Patient tolerated injection well.

## 2019-08-22 DIAGNOSIS — H401122 Primary open-angle glaucoma, left eye, moderate stage: Secondary | ICD-10-CM | POA: Diagnosis not present

## 2019-08-22 DIAGNOSIS — H2512 Age-related nuclear cataract, left eye: Secondary | ICD-10-CM | POA: Diagnosis not present

## 2019-08-22 DIAGNOSIS — H25812 Combined forms of age-related cataract, left eye: Secondary | ICD-10-CM | POA: Diagnosis not present

## 2019-08-24 ENCOUNTER — Ambulatory Visit: Payer: Medicare Other

## 2019-08-31 ENCOUNTER — Other Ambulatory Visit: Payer: Self-pay

## 2019-08-31 ENCOUNTER — Ambulatory Visit (INDEPENDENT_AMBULATORY_CARE_PROVIDER_SITE_OTHER): Payer: Medicare Other | Admitting: *Deleted

## 2019-08-31 DIAGNOSIS — E349 Endocrine disorder, unspecified: Secondary | ICD-10-CM | POA: Diagnosis not present

## 2019-08-31 MED ORDER — TESTOSTERONE CYPIONATE 100 MG/ML IM SOLN
100.0000 mg | Freq: Once | INTRAMUSCULAR | Status: AC
  Administered 2019-08-31: 18:00:00 100 mg via INTRAMUSCULAR

## 2019-08-31 NOTE — Progress Notes (Signed)
Per orders of Dr. Hernandez, injection of testosterone given by Indy Prestwood. Patient tolerated injection well. 

## 2019-09-18 ENCOUNTER — Other Ambulatory Visit: Payer: Self-pay | Admitting: Internal Medicine

## 2019-09-26 DIAGNOSIS — M25561 Pain in right knee: Secondary | ICD-10-CM | POA: Diagnosis not present

## 2019-09-26 DIAGNOSIS — M170 Bilateral primary osteoarthritis of knee: Secondary | ICD-10-CM | POA: Diagnosis not present

## 2019-10-12 ENCOUNTER — Encounter: Payer: Self-pay | Admitting: Internal Medicine

## 2019-10-12 ENCOUNTER — Ambulatory Visit (INDEPENDENT_AMBULATORY_CARE_PROVIDER_SITE_OTHER): Payer: Medicare Other | Admitting: Internal Medicine

## 2019-10-12 ENCOUNTER — Other Ambulatory Visit: Payer: Self-pay

## 2019-10-12 VITALS — BP 120/90 | HR 71 | Temp 97.1°F | Wt 177.7 lb

## 2019-10-12 DIAGNOSIS — R252 Cramp and spasm: Secondary | ICD-10-CM

## 2019-10-12 DIAGNOSIS — E349 Endocrine disorder, unspecified: Secondary | ICD-10-CM

## 2019-10-12 DIAGNOSIS — R7989 Other specified abnormal findings of blood chemistry: Secondary | ICD-10-CM

## 2019-10-12 DIAGNOSIS — J301 Allergic rhinitis due to pollen: Secondary | ICD-10-CM

## 2019-10-12 DIAGNOSIS — I10 Essential (primary) hypertension: Secondary | ICD-10-CM | POA: Diagnosis not present

## 2019-10-12 DIAGNOSIS — N529 Male erectile dysfunction, unspecified: Secondary | ICD-10-CM

## 2019-10-12 LAB — BASIC METABOLIC PANEL
BUN: 28 mg/dL — ABNORMAL HIGH (ref 6–23)
CO2: 24 mEq/L (ref 19–32)
Calcium: 9.1 mg/dL (ref 8.4–10.5)
Chloride: 104 mEq/L (ref 96–112)
Creatinine, Ser: 0.94 mg/dL (ref 0.40–1.50)
GFR: 77.61 mL/min (ref >60.00)
Glucose, Bld: 94 mg/dL (ref 70–99)
Potassium: 4.4 mEq/L (ref 3.5–5.1)
Sodium: 139 mEq/L (ref 135–145)

## 2019-10-12 LAB — MAGNESIUM: Magnesium: 2.3 mg/dL (ref 1.5–2.5)

## 2019-10-12 MED ORDER — TESTOSTERONE CYPIONATE 100 MG/ML IM SOLN
100.0000 mg | Freq: Once | INTRAMUSCULAR | Status: AC
  Administered 2019-10-12: 100 mg via INTRAMUSCULAR

## 2019-10-12 MED ORDER — CETIRIZINE HCL 10 MG PO TABS: 10.0000 mg | ORAL_TABLET | Freq: Every day | ORAL | 11 refills | Status: DC

## 2019-10-12 MED ORDER — TADALAFIL 20 MG PO TABS: 20.0000 mg | ORAL_TABLET | Freq: Every day | ORAL | 2 refills | Status: DC | PRN

## 2019-10-12 NOTE — Addendum Note (Signed)
Addended by: Westley Hummer B on: 10/12/2019 03:58 PM   Modules accepted: Orders

## 2019-10-12 NOTE — Patient Instructions (Signed)
-  Nice seeing you today!!  -Lab work today; will notify you once results are available.  -Take a daily zyrtec.

## 2019-10-12 NOTE — Progress Notes (Signed)
Established Patient Office Visit     CC/Reason for Visit: Discuss some acute concerns  HPI: David Goodman is a 78 y.o. male who is coming in today for the above mentioned reasons. 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  He has not had alcohol in many months however in a glass of wine for his recent birthday.  He is here today to discuss some acute concerns:  1.  When he went to visit his dentist he was told that his blood pressure was high and to follow-up with his PCP.  2.  He has a sensation of ear fullness and pressure especially in the mornings when he first wakes up, has noticed a lot of nasal drainage.  3.  He continues to have bilateral leg cramps and tingling of legs, this only happens at nighttime.   Past Medical/Surgical History: Past Medical History:  Diagnosis Date  . ALLERGIC RHINITIS 07/13/2007  . HYPERLIPIDEMIA 07/13/2007  . HYPERTENSION 07/13/2007  . 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 PEPTIC ULCER DISEASE 07/13/2007  . 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 COLONOSCOPY WITH PROPOFOL N/A 02/17/2019   Procedure: COLONOSCOPY WITH PROPOFOL;  Surgeon: Ronnette Juniper, MD;  Location: [facility];  Service: Gastroenterology;  Laterality: N/A;  . IR ANGIOGRAM VISCERAL SELECTIVE  02/16/2019  . IR ANGIOGRAM VISCERAL SELECTIVE  02/16/2019  . IR ANGIOGRAM VISCERAL SELECTIVE  02/16/2019  . IR US GUIDE VASC ACCESS RIGHT  02/16/2019  . ROTATOR CUFF REPAIR      Social History:  reports that he has never smoked. He has never used smokeless tobacco. He reports current alcohol use. He reports that he does not use drugs.  Allergies: Allergies  Allergen Reactions  . Lisinopril Cough    Cough  . Tramadol Hcl     REACTION: unspecified    Family History:  No history of heart disease, cancer, stroke that  he is aware of  Current Outpatient Medications:  .  Ascorbic Acid (VITAMIN C PO), Take 1 tablet by mouth daily., Disp: , Rfl:  .  diphenhydrAMINE (BENADRYL) 25 MG tablet, Take 25 mg by mouth daily as needed for allergies., Disp: , Rfl:  .  fluticasone (FLONASE) 50 MCG/ACT nasal spray, Place 2 sprays into both nostrils daily., Disp: 16 g, Rfl: 6 .  latanoprost (XALATAN) 0.005 % ophthalmic solution, Place 1 drop into both eyes at bedtime., Disp: 7.5 mL, Rfl: 1 .  levocetirizine (XYZAL) 5 MG tablet, TAKE 1 TABLET BY MOUTH  EVERY EVENING, Disp: 90 tablet, Rfl: 2 .  losartan (COZAAR) 100 MG tablet, Take 1 tablet (100 mg total) by mouth daily., Disp: 90 tablet, Rfl: 1 .  MAGNESIUM PO, Take 1 tablet by mouth daily., Disp: , Rfl:  .  omeprazole (PRILOSEC) 20 MG capsule, TAKE 1 CAPSULE BY MOUTH  DAILY, Disp: 90 capsule, Rfl: 3 .  pantoprazole (PROTONIX) 40 MG tablet, Take 1 tablet (40 mg total) by mouth daily., Disp: 30 tablet, Rfl: 1 .  psyllium (HYDROCIL/METAMUCIL) 95 % PACK, Take 1 packet by mouth 2 (two) times daily., Disp: 60 each, Rfl: 1 .  tadalafil (CIALIS) 20 MG tablet, Take 1 tablet (20 mg total) by mouth daily as needed for erectile dysfunction., Disp: 30 tablet, Rfl: 0 .  testosterone cypionate (DEPOTESTOTERONE CYPIONATE) 100 MG/ML injection, INJECT 100 MG (1 ML) AS DIRECTED EVERY 14 DAYS, Disp: , Rfl:  .  Testosterone Cypionate 100 MG/ML SOLN, Inject 100 mg as directed every 14 (fourteen) days., Disp: 1 vial, Rfl: 1 .  cetirizine (ZYRTEC) 10 MG tablet, Take 1 tablet (10 mg total) by mouth daily., Disp: 30 tablet, Rfl: 11 .  potassium chloride SA (K-DUR) 20 MEQ tablet, Take 1 tablet (20 mEq total) by mouth 2 (two) times daily for 3 days., Disp: 6 tablet, Rfl: 0  Current Facility-Administered Medications:  .  testosterone cypionate (DEPOTESTOTERONE CYPIONATE) injection 100 mg, 100 mg, Intramuscular, Q14 Days, Isaac Bliss, Rayford Halsted, MD, 100 mg at 05/14/19 0952 .  testosterone cypionate  (DEPOTESTOTERONE CYPIONATE) injection 100 mg, 100 mg, Intramuscular, Q14 Days, Isaac Bliss, Rayford Halsted, MD, 100 mg at 07/16/19 1051  Review of Systems:  Constitutional: Denies fever, chills, diaphoresis, appetite change and fatigue.  HEENT: Denies photophobia, eye pain, redness, hearing loss, ear pain, congestion, sore throat, rhinorrhea, sneezing, mouth sores, trouble swallowing, neck pain, neck stiffness and tinnitus.   Respiratory: Denies SOB, DOE, cough, chest tightness,  and wheezing.   Cardiovascular: Denies chest pain, palpitations and leg swelling.  Gastrointestinal: Denies nausea, vomiting, abdominal pain, diarrhea, constipation, blood in stool and abdominal distention.  Genitourinary: Denies dysuria, urgency, frequency, hematuria, flank pain and difficulty urinating.  Endocrine: Denies: hot or cold intolerance, sweats, changes in hair or nails, polyuria, polydipsia. Musculoskeletal: Denies myalgias, back pain, joint swelling, arthralgias and gait problem.  Skin: Denies pallor, rash and wound.  Neurological: Denies dizziness, seizures, syncope, weakness, light-headedness and headaches.  Hematological: Denies adenopathy. Easy bruising, personal or family bleeding history  Psychiatric/Behavioral: Denies suicidal ideation, mood changes, confusion, nervousness, sleep disturbance and agitation    Physical Exam: Vitals:   10/12/19 0925  BP: 120/90  Pulse: 71  Temp: (!) 97.1 F (36.2 C)  TempSrc: Temporal  SpO2: 94%  Weight: 177 lb 11.2 oz (80.6 kg)    Body mass index is 30.74 kg/m.   Constitutional: NAD, calm, comfortable Eyes: PERRL, lids and conjunctivae normal ENMT: Mucous membranes are moist.  Tympanic membrane is pearly white, no erythema or bulging bilaterally. Neck: normal, supple, no masses, no thyromegaly Respiratory: clear to auscultation bilaterally, no wheezing, no crackles. Normal respiratory effort. No accessory muscle use.  Cardiovascular: Regular rate and  rhythm, no murmurs / rubs / gallops. No extremity edema. 2+ pedal pulses.  Abdomen: no tenderness, no masses palpated. No hepatosplenomegaly. Bowel sounds positive.  Musculoskeletal: no clubbing / cyanosis. No joint deformity upper and lower extremities. Good ROM, no contractures. Normal muscle tone.  Skin: no rashes, lesions, ulcers. No induration Neurologic: Grossly intact and nonfocal  Psychiatric: Normal judgment and insight. Alert and oriented x 3. Normal mood.    Impression and Plan:  Seasonal allergic rhinitis due to pollen  -Suspect ear fullness is due to seasonal allergies as he also has some sinus pressure and nasal congestion. -Start daily cetirizine, continue Flonase.  Testosterone deficiency -To receive testosterone injection today.  Leg cramps  -Rule out electrolyte deficiency: Check magnesium and basic metabolic profile today). -If above is normal, consider treating with Requip for restless leg syndrome.  Will rule out iron deficiency today with anemia panel, also B12 deficiency can cause tingling and numbness.  Essential hypertension -Blood pressure little elevated in office today at 120/90, no medication changes today, will follow at next visit.  Erectile dysfunction -Requesting written prescription for Cialis today.    Patient Instructions  -Nice seeing you today!!  -Lab work today; will notify you once results are available.  -Take a daily zyrtec.  Estela Hernandez Acosta, MD [facility]   

## 2019-10-12 NOTE — Addendum Note (Signed)
Addended by: Raliegh Ip on: 10/12/2019 10:06 AM   Modules accepted: Orders

## 2019-10-14 LAB — ANEMIA PANEL
Ferritin: 98 ng/mL (ref 30–400)
Folate, Hemolysate: 468 ng/mL
Folate, RBC: 1054 ng/mL (ref >498)
Hematocrit: 44.4 % (ref 37.5–51.0)
Iron Saturation: 31 % (ref 15–55)
Iron: 99 ug/dL (ref 38–169)
Retic Ct Pct: 1.6 % (ref 0.6–2.6)
Total Iron Binding Capacity: 321 ug/dL (ref 250–450)
UIBC: 222 ug/dL (ref 111–343)
Vitamin B-12: 1737 pg/mL — ABNORMAL HIGH (ref 232–1245)

## 2019-10-17 ENCOUNTER — Encounter: Payer: Self-pay | Admitting: Internal Medicine

## 2019-10-17 ENCOUNTER — Other Ambulatory Visit: Payer: Self-pay | Admitting: Internal Medicine

## 2019-10-17 DIAGNOSIS — G2581 Restless legs syndrome: Secondary | ICD-10-CM

## 2019-10-17 MED ORDER — ROPINIROLE HCL 0.25 MG PO TABS: 0.2500 mg | ORAL_TABLET | Freq: Every day | ORAL | 0 refills | Status: DC

## 2019-10-23 NOTE — Progress Notes (Signed)
Patient brought his own supply of Testerone to the office.

## 2019-11-01 ENCOUNTER — Other Ambulatory Visit: Payer: Self-pay

## 2019-11-02 ENCOUNTER — Ambulatory Visit (INDEPENDENT_AMBULATORY_CARE_PROVIDER_SITE_OTHER): Payer: Medicare Other

## 2019-11-02 DIAGNOSIS — E349 Endocrine disorder, unspecified: Secondary | ICD-10-CM | POA: Diagnosis not present

## 2019-11-02 MED ORDER — TESTOSTERONE CYPIONATE 200 MG/ML IM SOLN
100.0000 mg | INTRAMUSCULAR | Status: DC
  Administered 2019-11-02: 10:00:00 100 mg via INTRAMUSCULAR

## 2019-11-08 NOTE — Progress Notes (Signed)
Sent to Dr. Jerilee Hoh for [place].

## 2019-11-12 ENCOUNTER — Other Ambulatory Visit: Payer: Self-pay

## 2019-11-12 ENCOUNTER — Telehealth (INDEPENDENT_AMBULATORY_CARE_PROVIDER_SITE_OTHER): Payer: Medicare Other | Admitting: Family Medicine

## 2019-11-12 ENCOUNTER — Telehealth: Payer: Self-pay

## 2019-11-12 DIAGNOSIS — R059 Cough, unspecified: Secondary | ICD-10-CM

## 2019-11-12 DIAGNOSIS — Z20822 Contact with and (suspected) exposure to covid-19: Secondary | ICD-10-CM

## 2019-11-12 DIAGNOSIS — R05 Cough: Secondary | ICD-10-CM

## 2019-11-12 MED ORDER — HYDROCODONE-HOMATROPINE 5-1.5 MG/5ML PO SYRP: 5.0000 mL | ORAL_SOLUTION | Freq: Four times a day (QID) | ORAL | 0 refills | Status: AC | PRN

## 2019-11-12 MED ORDER — HYDROCODONE-HOMATROPINE 5-1.5 MG/5ML PO SYRP: 5.0000 mL | ORAL_SOLUTION | Freq: Four times a day (QID) | ORAL | 0 refills | Status: DC | PRN

## 2019-11-12 NOTE — Telephone Encounter (Signed)
Pt has been scheduled. Pt stated that he went out Sat without a coat and think it may be from that.

## 2019-11-12 NOTE — Telephone Encounter (Signed)
Copied from [place] [id number]. Topic: Appointment Scheduling - Scheduling Inquiry for Clinic >> Nov 12, 2019  7:08 AM Rayann Heman wrote: Reason for CRM: pt calling to schedule an appointment for a dry cough

## 2019-11-12 NOTE — Telephone Encounter (Signed)
Please see message. Please send to requested pharmacy for patient. Thank you!

## 2019-11-12 NOTE — Progress Notes (Signed)
This visit type was conducted due to national recommendations for restrictions regarding the COVID-19 pandemic in an effort to limit this patient's exposure and mitigate transmission in our community.   Virtual Visit via Video Note  I connected with David Goodman on 11/12/19 at 11:45 AM EST by a video enabled telemedicine application and verified that I am speaking with the correct person using two identifiers.  Location patient: home Location provider:work or home office Persons participating in the virtual visit: patient, provider  I discussed the limitations of evaluation and management by telemedicine and the availability of in person appointments. The patient expressed understanding and agreed to proceed.   HPI: David Goodman is a non-smoker who is seen with onset of cough couple days ago over the weekend.  He states he has a similar cough usually each year about this time.  He states he has some allergies in the past.  He has had some mild nasal congestion.  No sick contacts.  Denies any fever.  No dyspnea.  No loss of taste or smell.  No diarrhea.  No sick contacts.  Lives with wife and she is asymptomatic.  He has been staying fairly isolated.  He states that he took Robitussin-DM last night with no relief of cough.  He states he is gotten very little sleep past couple nights.  Specifically requesting refill hydrocodone cough syrup which he has taken in the past with good success.   ROS: See pertinent positives and negatives per HPI.  Past Medical History:  Diagnosis Date  . ALLERGIC RHINITIS 07/13/2007  . HYPERLIPIDEMIA 07/13/2007  . HYPERTENSION 07/13/2007  . 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 PEPTIC ULCER DISEASE 07/13/2007  . 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 COLONOSCOPY WITH PROPOFOL N/A 02/17/2019   Procedure: COLONOSCOPY WITH PROPOFOL;  Surgeon: Ronnette Juniper, MD;  Location: [facility];  Service:  Gastroenterology;  Laterality: N/A;  . IR ANGIOGRAM VISCERAL SELECTIVE  02/16/2019  . IR ANGIOGRAM VISCERAL SELECTIVE  02/16/2019  . IR ANGIOGRAM VISCERAL SELECTIVE  02/16/2019  . IR US GUIDE VASC ACCESS RIGHT  02/16/2019  . ROTATOR CUFF REPAIR      No family history on file.  SOCIAL HX: Non-smoker.  Lives with wife   Current Outpatient Medications:  .  Ascorbic Acid (VITAMIN C PO), Take 1 tablet by mouth daily., Disp: , Rfl:  .  cetirizine (ZYRTEC) 10 MG tablet, Take 1 tablet (10 mg total) by mouth daily., Disp: 30 tablet, Rfl: 11 .  diphenhydrAMINE (BENADRYL) 25 MG tablet, Take 25 mg by mouth daily as needed for allergies., Disp: , Rfl:  .  fluticasone (FLONASE) 50 MCG/ACT nasal spray, Place 2 sprays into both nostrils daily., Disp: 16 g, Rfl: 6 .  HYDROcodone-homatropine (HYCODAN) 5-1.5 MG/5ML syrup, Take 5 mLs by mouth every 6 (six) hours as needed for up to 10 days., Disp: 120 mL, Rfl: 0 .  latanoprost (XALATAN) 0.005 % ophthalmic solution, Place 1 drop into both eyes at bedtime., Disp: 7.5 mL, Rfl: 1 .  levocetirizine (XYZAL) 5 MG tablet, TAKE 1 TABLET BY MOUTH  EVERY EVENING, Disp: 90 tablet, Rfl: 2 .  losartan (COZAAR) 100 MG tablet, Take 1 tablet (100 mg total) by mouth daily., Disp: 90 tablet, Rfl: 1 .  MAGNESIUM PO, Take 1 tablet by mouth daily., Disp: , Rfl:  .  omeprazole (PRILOSEC) 20 MG capsule, TAKE 1 CAPSULE BY MOUTH  DAILY, Disp: 90 capsule, Rfl: 3 .  pantoprazole (PROTONIX) 40 MG tablet, Take 1 tablet (40 mg total) by mouth daily., Disp: 30 tablet, Rfl: 1 .  potassium chloride SA (K-DUR) 20 MEQ tablet, Take 1 tablet (20 mEq total) by mouth 2 (two) times daily for 3 days., Disp: 6 tablet, Rfl: 0 .  psyllium (HYDROCIL/METAMUCIL) 95 % PACK, Take 1 packet by mouth 2 (two) times daily., Disp: 60 each, Rfl: 1 .  rOPINIRole (REQUIP) 0.25 MG tablet, Take 1 tablet (0.25 mg total) by mouth at bedtime., Disp: 90 tablet, Rfl: 0 .  tadalafil (CIALIS) 20 MG tablet, Take 1 tablet (20 mg  total) by mouth daily as needed for erectile dysfunction., Disp: 30 tablet, Rfl: 2 .  testosterone cypionate (DEPOTESTOTERONE CYPIONATE) 100 MG/ML injection, INJECT 100 MG (1 ML) AS DIRECTED EVERY 14 DAYS, Disp: , Rfl:  .  Testosterone Cypionate 100 MG/ML SOLN, Inject 100 mg as directed every 14 (fourteen) days., Disp: 1 vial, Rfl: 1  Current Facility-Administered Medications:  .  testosterone cypionate (DEPOTESTOSTERONE CYPIONATE) injection 100 mg, 100 mg, Intramuscular, Q14 Days, Isaac Bliss, Rayford Halsted, MD, 100 mg at 11/02/19 1024 .  testosterone cypionate (DEPOTESTOTERONE CYPIONATE) injection 100 mg, 100 mg, Intramuscular, Q14 Days, Isaac Bliss, Rayford Halsted, MD, 100 mg at 05/14/19 0952 .  testosterone cypionate (DEPOTESTOTERONE CYPIONATE) injection 100 mg, 100 mg, Intramuscular, Q14 Days, Isaac Bliss, Rayford Halsted, MD, 100 mg at 07/16/19 1051  EXAM:  VITALS per patient if applicable:  GENERAL: alert, oriented, appears well and in no acute distress  HEENT: atraumatic, conjunttiva clear, no obvious abnormalities on inspection of external nose and ears  NECK: normal movements of the head and neck  LUNGS: on inspection no signs of respiratory distress, breathing rate appears normal, no obvious gross SOB, gasping or wheezing  CV: no obvious cyanosis  MS: moves all visible extremities without noticeable abnormality  PSYCH/NEURO: pleasant and cooperative, no obvious depression or anxiety, speech and thought processing grossly intact  ASSESSMENT AND PLAN:  Discussed the following assessment and plan:  Cough.  Patient in no respiratory distress.  Sounds like he is fairly low risk for Covid in terms of exposure risk.  He is in no distress at this time.  We recommend the following  -We discussed Covid testing and he will go later today for that -Wrote for limited Hycodan cough syrup 1 teaspoon nightly for severe cough -Follow-up immediately for any fever or worsening dyspnea or  other concerns -He knows to stay quarantined until Covid status determined     I discussed the assessment and treatment plan with the patient. The patient was provided an opportunity to ask questions and all were answered. The patient agreed with the plan and demonstrated an understanding of the instructions.   The patient was advised to call back or seek an in-person evaluation if the symptoms worsen or if the condition fails to improve as anticipated.    Carolann Littler, MD

## 2019-11-12 NOTE — Telephone Encounter (Signed)
I can send new prescription for Hycodan to [facility] but someone will need to call and cancel prescription to [facility] on [address].

## 2019-11-12 NOTE — Telephone Encounter (Signed)
Pt called stating the pharmacy will not have the medication until Wednesday. Pt is requesting to have the prescription sent to the [facility] on [address]. Please advise.

## 2019-11-13 LAB — NOVEL CORONAVIRUS, NAA: SARS-CoV-2, NAA: DETECTED — AB

## 2019-11-13 NOTE — Telephone Encounter (Signed)
Called patient and he stated that he was able to pick up his medication.

## 2019-11-14 ENCOUNTER — Encounter: Payer: Self-pay | Admitting: *Deleted

## 2019-11-14 ENCOUNTER — Telehealth: Payer: Self-pay | Admitting: Nurse Practitioner

## 2019-11-14 ENCOUNTER — Telehealth: Payer: Self-pay | Admitting: *Deleted

## 2019-11-14 NOTE — Telephone Encounter (Signed)
Copied from [place] [id number]. Topic: General - Other >> Nov 14, 2019 11:05 AM Rainey Pines A wrote: Patient had appt yesterday and tested positive for covid today and would like a callback today in regards to what Dr. Jerilee Hoh would advise him to do next

## 2019-11-14 NOTE — Telephone Encounter (Signed)
Pt called back in to follow up on message sent. Please advise pt directly.

## 2019-11-14 NOTE — Telephone Encounter (Signed)
Patient notified and verbalized understanding. 

## 2019-11-14 NOTE — Telephone Encounter (Signed)
Quarantine at home, ED for severe symptoms (SOB). Otherwise treat at home with OTC meds (robitussin, tylenol sinus, antihistamines). May schedule VV if he still has questions.  [place]

## 2019-11-14 NOTE — Telephone Encounter (Signed)
Patient called and informed of positive covid test. Symptoms tier reviewed as well as criteria for ending isolation. Preventative practices reviewed. Patient verbalized understanding.  Called to Discuss with patient about Covid symptoms and the use of bamlanivimab, a monoclonal antibody infusion for those with mild to moderate Covid symptoms and at a high risk of hospitalization.     Pt is qualified for this infusion at the [facility] infusion center due to co-morbid conditions and/or a member of an at-risk group.    Patient Active Problem List   Diagnosis Date Noted  . RLS (restless legs syndrome) 10/17/2019  . Acute GI bleeding 02/13/2019  . Syncope 11/15/2018  . Diverticulitis 05/25/2018  . Acute blood loss anemia 05/25/2018  . GI bleeding 05/24/2018  . 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 Impaired glucose tolerance 09/27/2016  . GERD (gastroesophageal reflux disease) 09/27/2016  . BPH (benign prostatic hyperplasia) 06/18/2013  . 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 Dyslipidemia 07/13/2007  . Essential hypertension 07/13/2007  . Allergic rhinitis 07/13/2007  . PEPTIC ULCER DISEASE 07/13/2007  . NEPHROLITHIASIS, 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      Patient states that he is doing well and declines infusion at this time.   Patient advised to call back if he decides that he does want to get infusion. Callback number to the infusion center given. Patient advised to go to Urgent care or ED with severe symptoms. Patient has an appointment with PCP scheduled for this Friday.

## 2019-11-16 ENCOUNTER — Ambulatory Visit: Payer: Medicare Other

## 2019-11-19 MED ORDER — ONDANSETRON HCL 4 MG PO TABS: 4.0000 mg | ORAL_TABLET | Freq: Three times a day (TID) | ORAL | 0 refills | Status: DC | PRN

## 2019-11-19 NOTE — Telephone Encounter (Signed)
Patient informed of the message below.  Patient's wife was also informed of the information also and requested a medication for nausea to be sent [facility].  Patient also asked when he should repeat the COVID test and I advised him I thought the general rule was to quarantine for 14 days and then repeat this.  Message sent to Dr Elease Hashimoto for medication and to verify testing date recommendation.

## 2019-11-19 NOTE — Telephone Encounter (Signed)
Spoke with the pt and informed him of the message below in regards to repeat testing and he is aware the Rx for Zofran was sent to his pharmacy.

## 2019-11-21 ENCOUNTER — Telehealth: Payer: Self-pay | Admitting: Internal Medicine

## 2019-11-21 NOTE — Telephone Encounter (Signed)
Message Routed to PCP CMA 

## 2019-11-21 NOTE — Telephone Encounter (Signed)
Pt stated he is having a hard time sleeping due to his hernia. He would like to know if Dr. Jerilee Hoh would send rx for clonazepam to his pharmacy to help. Also requesting callback . Please advise.    David Goodman, [place]. Phone:  [phone]  Fax:  [phone]

## 2019-11-22 ENCOUNTER — Other Ambulatory Visit: Payer: Self-pay | Admitting: Internal Medicine

## 2019-11-22 DIAGNOSIS — U071 COVID-19: Secondary | ICD-10-CM

## 2019-11-22 MED ORDER — BENZONATATE 200 MG PO CAPS: 200.0000 mg | ORAL_CAPSULE | Freq: Two times a day (BID) | ORAL | 0 refills | Status: DC | PRN

## 2019-11-22 NOTE — Telephone Encounter (Signed)
Spoke with patient and he will pick the prescription up from the original pharmacy.

## 2019-11-22 NOTE — Telephone Encounter (Signed)
Patient's wife is calling to ask if the prescription could be sent to the [facility] on [address] please?  The medication was sent to the [facility] on [facility].

## 2019-11-22 NOTE — Telephone Encounter (Signed)
Relayed meddsage from provider to patient.  Patient is requesting something different for his cough.  He stated his hernia is hurting due to coughing so much and that his cough medicine that was prescribed to him isn't helping.  He is requesting a call back when something is called in.    Pharmacy- [facility] on [address]

## 2019-11-22 NOTE — Telephone Encounter (Signed)
Will need OV. If his hernia is causing pain, may even need ED evaluation. Needs to be 14 days out from his COVID diagnosis and have no more symptoms to be seen in office.

## 2019-11-22 NOTE — Telephone Encounter (Signed)
He was already given hycodan. Can try tessalon perles, will send in Rx.

## 2019-11-26 ENCOUNTER — Telehealth: Payer: Self-pay | Admitting: Internal Medicine

## 2019-11-26 NOTE — Telephone Encounter (Signed)
Copied from [place] [id number]. Topic: Quick Communication - Rx Refill/Question >> Nov 26, 2019  9:59 AM Rainey Pines A wrote: Medication: Codeine   Has the patient contacted their pharmacy? Yes (Agent: If no, request that the patient contact the pharmacy for the refill.) (Agent: If yes, when and what did the pharmacy advise?)Contact PCP  Preferred Pharmacy (with phone number or street name): [place], [place].  Phone:  [phone] Fax:  [phone]     Agent: Please be advised that RX refills may take up to 3 business days. We ask that you follow-up with your pharmacy.

## 2019-11-27 ENCOUNTER — Telehealth (INDEPENDENT_AMBULATORY_CARE_PROVIDER_SITE_OTHER): Payer: Medicare Other | Admitting: Family Medicine

## 2019-11-27 ENCOUNTER — Other Ambulatory Visit: Payer: Self-pay

## 2019-11-27 DIAGNOSIS — U071 COVID-19: Secondary | ICD-10-CM

## 2019-11-27 DIAGNOSIS — R059 Cough, unspecified: Secondary | ICD-10-CM

## 2019-11-27 DIAGNOSIS — R05 Cough: Secondary | ICD-10-CM

## 2019-11-27 MED ORDER — HYDROCODONE-HOMATROPINE 5-1.5 MG/5ML PO SYRP: 5.0000 mL | ORAL_SOLUTION | Freq: Four times a day (QID) | ORAL | 0 refills | Status: AC | PRN

## 2019-11-27 NOTE — Progress Notes (Signed)
This visit type was conducted due to national recommendations for restrictions regarding the COVID-19 pandemic in an effort to limit this patient's exposure and mitigate transmission in our community.   Virtual Visit via Video Note  I connected with David Goodman on 11/27/19 at  4:00 PM EST by a video enabled telemedicine application and verified that I am speaking with the correct person using two identifiers.  Location patient: home Location provider:work or home office Persons participating in the virtual visit: patient, provider  I discussed the limitations of evaluation and management by telemedicine and the availability of in person appointments. The patient expressed understanding and agreed to proceed.   HPI: David Goodman had recent diagnosis of Covid infection back on December 14.  Overall, he feels better but he is having severe cough especially at night when he lays down.  This is interfering with sleep greatly.  He has not had any further fevers or chills.  No myalgias.  Denies any nausea, vomiting, or diarrhea.  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  He took hydrocodone cough syrup previously which did help with obtaining some sleep.  He is not aware of any wheezing.   ROS: See pertinent positives and negatives per HPI.  Past Medical History:  Diagnosis Date  . ALLERGIC RHINITIS 07/13/2007  . HYPERLIPIDEMIA 07/13/2007  . HYPERTENSION 07/13/2007  . 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 PEPTIC ULCER DISEASE 07/13/2007  . 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 COLONOSCOPY WITH PROPOFOL N/A 02/17/2019   Procedure: COLONOSCOPY WITH PROPOFOL;  Surgeon: Ronnette Juniper, MD;  Location: [facility];  Service: Gastroenterology;  Laterality: N/A;  . IR ANGIOGRAM VISCERAL SELECTIVE  02/16/2019  . IR ANGIOGRAM VISCERAL SELECTIVE  02/16/2019  . IR ANGIOGRAM VISCERAL SELECTIVE   02/16/2019  . IR US GUIDE VASC ACCESS RIGHT  02/16/2019  . ROTATOR CUFF REPAIR      No family history on file.  SOCIAL HX: Non-smoker   Current Outpatient Medications:  .  Ascorbic Acid (VITAMIN C PO), Take 1 tablet by mouth daily., Disp: , Rfl:  .  benzonatate (TESSALON) 200 MG capsule, Take 1 capsule (200 mg total) by mouth 2 (two) times daily as needed for cough., Disp: 20 capsule, Rfl: 0 .  cetirizine (ZYRTEC) 10 MG tablet, Take 1 tablet (10 mg total) by mouth daily., Disp: 30 tablet, Rfl: 11 .  diphenhydrAMINE (BENADRYL) 25 MG tablet, Take 25 mg by mouth daily as needed for allergies., Disp: , Rfl:  .  fluticasone (FLONASE) 50 MCG/ACT nasal spray, Place 2 sprays into both nostrils daily., Disp: 16 g, Rfl: 6 .  HYDROcodone-homatropine (HYCODAN) 5-1.5 MG/5ML syrup, Take 5 mLs by mouth every 6 (six) hours as needed for up to 10 days., Disp: 120 mL, Rfl: 0 .  latanoprost (XALATAN) 0.005 % ophthalmic solution, Place 1 drop into both eyes at bedtime., Disp: 7.5 mL, Rfl: 1 .  levocetirizine (XYZAL) 5 MG tablet, TAKE 1 TABLET BY MOUTH  EVERY EVENING, Disp: 90 tablet, Rfl: 2 .  losartan (COZAAR) 100 MG tablet, Take 1 tablet (100 mg total) by mouth daily., Disp: 90 tablet, Rfl: 1 .  MAGNESIUM PO, Take 1 tablet by mouth daily., Disp: , Rfl:  .  omeprazole (PRILOSEC) 20 MG capsule, TAKE 1 CAPSULE BY MOUTH  DAILY, Disp: 90 capsule, Rfl: 3 .  ondansetron (ZOFRAN) 4 MG tablet, Take 1 tablet (4 mg total) by  mouth every 8 (eight) hours as needed for nausea or vomiting., Disp: 20 tablet, Rfl: 0 .  pantoprazole (PROTONIX) 40 MG tablet, Take 1 tablet (40 mg total) by mouth daily., Disp: 30 tablet, Rfl: 1 .  potassium chloride SA (K-DUR) 20 MEQ tablet, Take 1 tablet (20 mEq total) by mouth 2 (two) times daily for 3 days., Disp: 6 tablet, Rfl: 0 .  psyllium (HYDROCIL/METAMUCIL) 95 % PACK, Take 1 packet by mouth 2 (two) times daily., Disp: 60 each, Rfl: 1 .  rOPINIRole (REQUIP) 0.25 MG tablet, Take 1 tablet  (0.25 mg total) by mouth at bedtime., Disp: 90 tablet, Rfl: 0 .  tadalafil (CIALIS) 20 MG tablet, Take 1 tablet (20 mg total) by mouth daily as needed for erectile dysfunction., Disp: 30 tablet, Rfl: 2 .  testosterone cypionate (DEPOTESTOTERONE CYPIONATE) 100 MG/ML injection, INJECT 100 MG (1 ML) AS DIRECTED EVERY 14 DAYS, Disp: , Rfl:  .  Testosterone Cypionate 100 MG/ML SOLN, Inject 100 mg as directed every 14 (fourteen) days., Disp: 1 vial, Rfl: 1  Current Facility-Administered Medications:  .  testosterone cypionate (DEPOTESTOSTERONE CYPIONATE) injection 100 mg, 100 mg, Intramuscular, Q14 Days, Isaac Bliss, Rayford Halsted, MD, 100 mg at 11/02/19 1024 .  testosterone cypionate (DEPOTESTOTERONE CYPIONATE) injection 100 mg, 100 mg, Intramuscular, Q14 Days, Isaac Bliss, Rayford Halsted, MD, 100 mg at 05/14/19 0952 .  testosterone cypionate (DEPOTESTOTERONE CYPIONATE) injection 100 mg, 100 mg, Intramuscular, Q14 Days, Isaac Bliss, Rayford Halsted, MD, 100 mg at 07/16/19 1051  EXAM:  VITALS per patient if applicable:  GENERAL: alert, oriented, appears well and in no acute distress  HEENT: atraumatic, conjunttiva clear, no obvious abnormalities on inspection of external nose and ears  NECK: normal movements of the head and neck  LUNGS: on inspection no signs of respiratory distress, breathing rate appears normal, no obvious gross SOB, gasping or wheezing  CV: no obvious cyanosis  MS: moves all visible extremities without noticeable abnormality  PSYCH/NEURO: pleasant and cooperative, no obvious depression or anxiety, speech and thought processing grossly intact  ASSESSMENT AND PLAN:  Discussed the following assessment and plan:  Recent COVID-19 infection overall improved but with severe nighttime cough not relieved with Tessalon or over-the-counter medications  -He was given 1 refill Hycodan cough syrup 1 teaspoon nightly for severe cough.  Avoid prolonged use -Follow-up with primary for  any fever or other worsening symptoms     I discussed the assessment and treatment plan with the patient. The patient was provided an opportunity to ask questions and all were answered. The patient agreed with the plan and demonstrated an understanding of the instructions.   The patient was advised to call back or seek an in-person evaluation if the symptoms worsen or if the condition fails to improve as anticipated.    Carolann Littler, MD

## 2019-11-27 NOTE — Telephone Encounter (Signed)
Scheduled an appointment for patient with David Goodman.  Jerilee Hoh was booked for the day.

## 2019-11-27 NOTE — Telephone Encounter (Signed)
He will need OV as he has not been on this med before.  [place]

## 2019-12-03 ENCOUNTER — Telehealth: Payer: Self-pay | Admitting: Internal Medicine

## 2019-12-03 NOTE — Telephone Encounter (Signed)
David Goodman call he need a refill on the med that help him to urinate

## 2019-12-04 ENCOUNTER — Telehealth: Payer: Self-pay | Admitting: Internal Medicine

## 2019-12-04 MED ORDER — FLUTICASONE PROPIONATE 50 MCG/ACT NA SUSP: 2.0000 | Freq: Every day | NASAL | 6 refills | Status: DC

## 2019-12-04 NOTE — Addendum Note (Signed)
Addended by: Lelon Frohlich Y on: 12/04/2019 12:35 PM   Modules accepted: Orders

## 2019-12-04 NOTE — Telephone Encounter (Signed)
Patient called in stating he needs several refills for: Lasix klonopin Flonase Pt would like refills to be sent to [place], [place].  [address] [place] [address]  Phone:  [phone] Fax:  [phone]

## 2019-12-04 NOTE — Telephone Encounter (Signed)
Pt request refill of flomax; not on active med list; see CRM# [phone]; will route to office for final disposition.

## 2019-12-04 NOTE — Telephone Encounter (Signed)
Klonopin and lasix not on current medication list?  Okay to refill?

## 2019-12-04 NOTE — Telephone Encounter (Signed)
What med is this?

## 2019-12-04 NOTE — Addendum Note (Signed)
Addended by: Westley Hummer B on: 12/04/2019 12:12 PM   Modules accepted: Orders

## 2019-12-04 NOTE — Telephone Encounter (Signed)
Copied from [place] [id number]. Topic: Quick Communication - Rx Refill/Question >> Dec 04, 2019  2:56 PM Mcneil, Ja-Kwan wrote: Medication: tamsulosin (FLOMAX) 0.4 MG CAPS capsule  Has the patient contacted their pharmacy? yes   Preferred Pharmacy (with phone number or street name): [place], [place]. Phone: [phone]   Fax: [phone]  Agent: Please be advised that RX refills may take up to 3 business days. We ask that you follow-up with your pharmacy.

## 2019-12-05 MED ORDER — TAMSULOSIN HCL 0.4 MG PO CAPS: 0.4000 mg | ORAL_CAPSULE | Freq: Every day | ORAL | 0 refills | Status: DC

## 2019-12-05 NOTE — Addendum Note (Signed)
Addended by: Westley Hummer B on: 12/05/2019 07:23 AM   Modules accepted: Orders

## 2019-12-05 NOTE — Telephone Encounter (Signed)
[place] for 90 days with 1 refill of FLomax

## 2019-12-05 NOTE — Telephone Encounter (Signed)
Rx sent 

## 2019-12-06 NOTE — Telephone Encounter (Signed)
Spoke with patient and lasix is not needed. Patient states that Dr Raliegh Ip prescripbed Klonopin 0.5 mg take half tab at bedtime as needed.  He would like a refill if possible.

## 2019-12-06 NOTE — Telephone Encounter (Signed)
Klonopin is a controlled substance. I would need to discuss with him the reasons why he feels he needs it to determine if Rx is appropriate. Please schedule OV, can be virtual.

## 2019-12-07 NOTE — Telephone Encounter (Signed)
Patient is aware and an appointment scheduled 

## 2019-12-11 ENCOUNTER — Encounter: Payer: Self-pay | Admitting: *Deleted

## 2019-12-11 ENCOUNTER — Telehealth (INDEPENDENT_AMBULATORY_CARE_PROVIDER_SITE_OTHER): Payer: Medicare Other | Admitting: Internal Medicine

## 2019-12-11 ENCOUNTER — Other Ambulatory Visit: Payer: Self-pay

## 2019-12-11 DIAGNOSIS — U071 COVID-19: Secondary | ICD-10-CM | POA: Diagnosis not present

## 2019-12-11 DIAGNOSIS — F411 Generalized anxiety disorder: Secondary | ICD-10-CM | POA: Diagnosis not present

## 2019-12-11 MED ORDER — CLONAZEPAM 0.5 MG PO TABS: 0.2500 mg | ORAL_TABLET | Freq: Every evening | ORAL | 1 refills | Status: DC | PRN

## 2019-12-11 MED ORDER — BENZONATATE 200 MG PO CAPS: 200.0000 mg | ORAL_CAPSULE | Freq: Two times a day (BID) | ORAL | 0 refills | Status: DC | PRN

## 2019-12-11 NOTE — Progress Notes (Signed)
Virtual Visit via Video Note  I connected with David Goodman on 12/11/19 at  2:30 PM EST by a video enabled telemedicine application and verified that I am speaking with the correct person using two identifiers.  Location patient: home Location provider: work office Persons participating in the virtual visit: patient, provider  I discussed the limitations of evaluation and management by telemedicine and the availability of in person appointments. The patient expressed understanding and agreed to proceed.   HPI: David Goodman has made this appointment due to continued concerns.  He was diagnosed with COVID-19 4 weeks ago.  He had a virtual visit with one of our in office providers who prescribed a medication for cough, Tessalon Perles which worked pretty well for him.  He needs a refill.  He is feeling very fatigued, he has lost 13 pounds in a month.  He looks pretty pale on video today.  The continued coughing is his main concern.  He does not feel short of breath or have any chest pain.   ROS: Constitutional: Denies fever, chills, diaphoresis. HEENT: Denies photophobia, eye pain, redness, hearing loss, ear pain, congestion, sore throat, rhinorrhea, sneezing, mouth sores, trouble swallowing, neck pain, neck stiffness and tinnitus.   Respiratory: Denies SOB, DOE,  chest tightness,  and wheezing.   Cardiovascular: Denies chest pain, palpitations and leg swelling.  Gastrointestinal: Denies nausea, vomiting, abdominal pain, diarrhea, constipation, blood in stool and abdominal distention.  Genitourinary: Denies dysuria, urgency, frequency, hematuria, flank pain and difficulty urinating.  Endocrine: Denies: hot or cold intolerance, sweats, changes in hair or nails, polyuria, polydipsia. Musculoskeletal: Denies myalgias, back pain, joint swelling, arthralgias and gait problem.  Skin: Denies pallor, rash and wound.  Neurological: Denies dizziness, seizures, syncope,  light-headedness, numbness and  headaches.  Hematological: Denies adenopathy. 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  SOCIAL HX:   reports that he has never smoked. He has never used smokeless tobacco. He reports current alcohol use. He reports that he does not use drugs.   Current Outpatient Medications:    Ascorbic Acid (VITAMIN C PO), Take 1 tablet by mouth daily., Disp: , Rfl:    benzonatate (TESSALON) 200 MG capsule, Take 1 capsule (200 mg total) by mouth 2 (two) times daily as needed for cough., Disp: 20 capsule, Rfl: 0   cetirizine (ZYRTEC) 10 MG tablet, Take 1 tablet (10 mg total) by mouth daily., Disp: 30 tablet, Rfl: 11   clonazePAM (KLONOPIN) 0.5 MG tablet, Take 0.5 tablets (0.25 mg total) by mouth at bedtime as needed for anxiety., Disp: 20 tablet, Rfl: 1   diphenhydrAMINE (BENADRYL) 25 MG tablet, Take 25 mg by mouth daily as needed for allergies., Disp: ,  Rfl:    fluticasone (FLONASE) 50 MCG/ACT nasal spray, Place 2 sprays into both nostrils daily., Disp: 16 g, Rfl: 6   latanoprost (XALATAN) 0.005 % ophthalmic solution, Place 1 drop into both eyes at bedtime.,  Disp: 7.5 mL, Rfl: 1   levocetirizine (XYZAL) 5 MG tablet, TAKE 1 TABLET BY MOUTH  EVERY EVENING, Disp: 90 tablet, Rfl: 2   losartan (COZAAR) 100 MG tablet, Take 1 tablet (100 mg total) by mouth daily., Disp: 90 tablet, Rfl: 1   MAGNESIUM PO, Take 1 tablet by mouth daily., Disp: , Rfl:    omeprazole (PRILOSEC) 20 MG capsule, TAKE 1 CAPSULE BY MOUTH  DAILY, Disp: 90 capsule, Rfl: 3   ondansetron (ZOFRAN) 4 MG tablet, Take 1 tablet (4 mg total) by mouth every 8 (eight) hours as needed for nausea or vomiting., Disp: 20 tablet, Rfl: 0   pantoprazole (PROTONIX) 40 MG tablet, Take 1 tablet (40 mg total) by mouth daily., Disp: 30 tablet, Rfl: 1   potassium chloride SA (K-DUR) 20 MEQ tablet, Take 1 tablet (20 mEq total) by mouth 2 (two) times daily for 3 days., Disp: 6 tablet, Rfl: 0   psyllium (HYDROCIL/METAMUCIL) 95 % PACK, Take 1 packet by mouth 2 (two) times daily., Disp: 60 each, Rfl: 1   rOPINIRole (REQUIP) 0.25 MG tablet, Take 1 tablet (0.25 mg total) by mouth at bedtime., Disp: 90 tablet, Rfl: 0   tadalafil (CIALIS) 20 MG tablet, Take 1 tablet (20 mg total) by mouth daily as needed for erectile dysfunction., Disp: 30 tablet, Rfl: 2   tamsulosin (FLOMAX) 0.4 MG CAPS capsule, Take 1 capsule (0.4 mg total) by mouth daily., Disp: 90 capsule, Rfl: 0   testosterone cypionate (DEPOTESTOTERONE CYPIONATE) 100 MG/ML injection, INJECT 100 MG (1 ML) AS DIRECTED EVERY 14 DAYS, Disp: , Rfl:    Testosterone Cypionate 100 MG/ML SOLN, Inject 100 mg as directed every 14 (fourteen) days., Disp: 1 vial, Rfl: 1  Current Facility-Administered Medications:    testosterone cypionate (DEPOTESTOSTERONE CYPIONATE) injection 100 mg, 100 mg, Intramuscular, Q14 Days, Isaac Bliss, Rayford Halsted, MD, 100 mg at 11/02/19 1024   testosterone cypionate (DEPOTESTOTERONE CYPIONATE) injection 100 mg, 100 mg, Intramuscular, Q14 Days, Isaac Bliss, Rayford Halsted, MD, 100 mg at 05/14/19 [id number]   testosterone cypionate  (DEPOTESTOTERONE CYPIONATE) injection 100 mg, 100 mg, Intramuscular, Q14 Days, Isaac Bliss, Rayford Halsted, MD, 100 mg at 07/16/19 1051  EXAM:   VITALS per patient if applicable: None reported  GENERAL: alert, oriented, appears well and in no acute distress, pale  HEENT: atraumatic, conjunttiva clear, no obvious abnormalities on inspection of external nose and ears  NECK: normal movements of the head and neck  LUNGS: on inspection no signs of respiratory distress, breathing rate appears normal, no obvious gross increased work of breathing, gasping or wheezing  CV: no obvious cyanosis  MS: moves all visible extremities without noticeable abnormality  PSYCH/NEURO: pleasant and cooperative, no obvious depression or anxiety, speech and thought processing grossly intact  ASSESSMENT AND PLAN:   COVID-19 -This was diagnosed 4 weeks ago, he had a subsequent negative test. -I am concerned about his continued cough; at this point believe in person evaluation with lung auscultation and probably chest x-ray would be beneficial. -I will see about setting him up with the respiratory clinic for tomorrow. -In the meantime agreed to refilling Tessalon Perles.  GAD (generalized anxiety disorder)  -Will refill his Klonopin 0.5 mg a which she takes half a tablet as needed at bedtime for anxiety  symptoms.    I discussed the assessment and treatment plan with the patient. The patient was provided an opportunity to ask questions and all were answered. The patient agreed with the plan and demonstrated an understanding of the instructions.   The patient was advised to call back or seek an in-person evaluation if the symptoms worsen or if the condition fails to improve as anticipated.    Lelon Frohlich, MD  [facility]

## 2019-12-12 ENCOUNTER — Ambulatory Visit (INDEPENDENT_AMBULATORY_CARE_PROVIDER_SITE_OTHER): Payer: Medicare Other | Admitting: Family Medicine

## 2019-12-12 VITALS — BP 160/90 | HR 74 | Temp 97.8°F | Resp 14 | Ht 65.0 in | Wt 172.0 lb

## 2019-12-12 DIAGNOSIS — R059 Cough, unspecified: Secondary | ICD-10-CM

## 2019-12-12 DIAGNOSIS — J40 Bronchitis, not specified as acute or chronic: Secondary | ICD-10-CM

## 2019-12-12 DIAGNOSIS — R05 Cough: Secondary | ICD-10-CM | POA: Diagnosis not present

## 2019-12-12 DIAGNOSIS — U071 COVID-19: Secondary | ICD-10-CM

## 2019-12-12 DIAGNOSIS — R0602 Shortness of breath: Secondary | ICD-10-CM

## 2019-12-12 MED ORDER — AEROCHAMBER MINI CHAMBER DEVI: 0 refills | Status: DC

## 2019-12-12 MED ORDER — PREDNISONE 20 MG PO TABS: 40.0000 mg | ORAL_TABLET | Freq: Every day | ORAL | 0 refills | Status: AC

## 2019-12-12 MED ORDER — CEFDINIR 300 MG PO CAPS: 600.0000 mg | ORAL_CAPSULE | Freq: Every day | ORAL | 0 refills | Status: DC

## 2019-12-12 MED ORDER — HYDROCODONE-HOMATROPINE 5-1.5 MG/5ML PO SYRP: 5.0000 mL | ORAL_SOLUTION | Freq: Three times a day (TID) | ORAL | 0 refills | Status: DC | PRN

## 2019-12-12 MED ORDER — ALBUTEROL SULFATE HFA 108 (90 BASE) MCG/ACT IN AERS: 1.0000 | INHALATION_SPRAY | RESPIRATORY_TRACT | Status: DC | PRN

## 2019-12-12 NOTE — Patient Instructions (Addendum)
For x-ray imaging: [facility]  [place], [place], [place] [address] Enter through [place] and request x-ray department. You will be contacted either via phone or via [place] with your x-ray result.  Complete all medication as directed.     Cough, Adult A cough helps to clear your throat and lungs. A cough may be a sign of an illness or another medical condition. An acute cough may only last 2-3 weeks, while a chronic cough may last 8 or more weeks. Many things can cause a cough. They include:  Germs (viruses or bacteria) that attack the airway.  Breathing in things that bother (irritate) your lungs.  Allergies.  Asthma.  Mucus that runs down the back of your throat (postnasal drip).  Smoking.  Acid backing up from the stomach into the tube that moves food from the mouth to the stomach (gastroesophageal reflux).  Some medicines.  Lung problems.  Other medical conditions, such as heart failure or a blood clot in the lung (pulmonary embolism). Follow these instructions at home: Medicines  Take over-the-counter and prescription medicines only as told by your doctor.  Talk with your doctor before you take medicines that stop a cough (coughsuppressants). Lifestyle   Do not smoke, and try not to be around smoke. Do not use any products that contain nicotine or tobacco, such as cigarettes, e-cigarettes, and chewing tobacco. If you need help quitting, ask your doctor.  Drink enough fluid to keep your pee (urine) pale yellow.  Avoid caffeine.  Do not drink alcohol if your doctor tells you not to drink. General instructions   Watch for any changes in your cough. Tell your doctor about them.  Always cover your mouth when you cough.  Stay away from things that make you cough, such as perfume, candles, campfire smoke, or cleaning products.  If the air is dry, use a cool mist vaporizer or humidifier in your home.  If your cough is worse at night,  try using extra pillows to raise your head up higher while you sleep.  Rest as needed.  Keep all follow-up visits as told by your doctor. This is important. Contact a doctor if:  You have new symptoms.  You cough up pus.  Your cough does not get better after 2-3 weeks, or your cough gets worse.  Cough medicine does not help your cough and you are not sleeping well.  You have pain that gets worse or pain that is not helped with medicine.  You have a fever.  You are losing weight and you do not know why.  You have night sweats. Get help right away if:  You cough up blood.  You have trouble breathing.  Your heartbeat is very fast. These symptoms may be an emergency. Do not wait to see if the symptoms will go away. Get medical help right away. Call your local emergency services (911 in the [place] Do not drive yourself to the hospital. Summary  A cough helps to clear your throat and lungs. Many things can cause a cough.  Take over-the-counter and prescription medicines only as told by your doctor.  Always cover your mouth when you cough.  Contact a doctor if you have new symptoms or you have a cough that does not get better or gets worse. This information is not intended to replace advice given to you by your health care provider. Make sure you discuss any questions you have with your health care provider. Document Revised: 12/04/2018  Document Reviewed: 12/04/2018 Elsevier Patient Education  [place] of Breath, Adult Shortness of breath means you have trouble breathing. Shortness of breath could be a sign of a medical problem. Follow these instructions at home:   Watch for any changes in your symptoms.  Do not use any products that contain nicotine or tobacco, such as cigarettes, e-cigarettes, and chewing tobacco.  Do not smoke. Smoking can cause shortness of breath. If you need help to quit smoking, ask your doctor.  Avoid things that can make  it harder to breathe, such as: ? Mold. ? Dust. ? Air pollution. ? Chemical smells. ? Things that can cause allergy symptoms (allergens), if you have allergies.  Keep your living space clean. Use products that help remove mold and dust.  Rest as needed. Slowly return to your normal activities.  Take over-the-counter and prescription medicines only as told by your doctor. This includes oxygen therapy and inhaled medicines.  Keep all follow-up visits as told by your doctor. This is important. Contact a doctor if:  Your condition does not get better as soon as expected.  You have a hard time doing your normal activities, even after you rest.  You have new symptoms. Get help right away if:  Your shortness of breath gets worse.  You have trouble breathing when you are resting.  You feel light-headed or you pass out (faint).  You have a cough that is not helped by medicines.  You cough up blood.  You have pain with breathing.  You have pain in your chest, arms, shoulders, or belly (abdomen).  You have a fever.  You cannot walk up stairs.  You cannot exercise the way you normally do. These symptoms may represent a serious problem that is an emergency. Do not wait to see if the symptoms will go away. Get medical help right away. Call your local emergency services (911 in the [place] Do not drive yourself to the hospital. Summary  Shortness of breath is when you have trouble breathing enough air. It can be a sign of a medical problem.  Avoid things that make it hard for you to breathe, such as smoking, pollution, mold, and dust.  Watch for any changes in your symptoms. Contact your doctor if you do not get better or you get worse. This information is not intended to replace advice given to you by your health care provider. Make sure you discuss any questions you have with your health care provider. Document Revised: 04/17/2018 Document Reviewed: 04/17/2018 Elsevier Patient  Education  [place].

## 2019-12-12 NOTE — Progress Notes (Deleted)
[name], male    DOB: 27-Feb-1941, 79 y.o.   MRN: [id number]  PCP: Isaac Bliss, Rayford Halsted, MD  Chief Complaint  Patient presents with  . Shortness of Breath  . covid 19  . coughing    Subjective:  HPI David Goodman is a 79 y.o. male presents for evaluation    Diagnosed  Shortness of breath  Benzonatate at night   Coughing up white thin mucus Oxygen level  Social History   Socioeconomic History  . Marital status: Married    Spouse name: Not on file  . Number of children: Not on file  . Years of education: Not on file  . Highest education level: Not on file  Occupational History  . Not on file  Tobacco Use  . Smoking status: Never Smoker  . Smokeless tobacco: Never Used  Substance and Sexual Activity  . Alcohol use: Yes    Comment: couple times a week - wine  . Drug use: No  . Sexual activity: Not Currently  Other Topics Concern  . Not on file  Social History Narrative  . Not on file   Social Determinants of Health   Financial Resource Strain:   . Difficulty of Paying Living Expenses: Not on file  Food Insecurity:   . Worried About Charity fundraiser in the Last Year: Not on file  . Ran Out of Food in the Last Year: Not on file  Transportation Needs:   . Lack of Transportation (Medical): Not on file  . Lack of Transportation (Non-Medical): Not on file  Physical Activity:   . Days of Exercise per Week: Not on file  . Minutes of Exercise per Session: Not on file  Stress:   . Feeling of Stress : Not on file  Social Connections:   . Frequency of Communication with Friends and Family: Not on file  . Frequency of Social Gatherings with Friends and Family: Not on file  . Attends Religious Services: Not on file  . Active Member of Clubs or Organizations: Not on file  . Attends Archivist Meetings: Not on file  . Marital Status: Not on file  Intimate Partner Violence:   . Fear of Current or Ex-Partner: Not on file  . Emotionally  Abused: Not on file  . Physically Abused: Not on file  . Sexually Abused: Not on file    No family history on file.   Review of Systems  Patient Active Problem List   Diagnosis Date Noted  . RLS (restless legs syndrome) 10/17/2019  . Acute GI bleeding 02/13/2019  . Syncope 11/15/2018  . Diverticulitis 05/25/2018  . Acute blood loss anemia 05/25/2018  . GI bleeding 05/24/2018  . 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 Impaired glucose tolerance 09/27/2016  . GERD (gastroesophageal reflux disease) 09/27/2016  . BPH (benign prostatic hyperplasia) 06/18/2013  . 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 Dyslipidemia 07/13/2007  . Essential hypertension 07/13/2007  . Allergic rhinitis 07/13/2007  . PEPTIC ULCER DISEASE 07/13/2007  . NEPHROLITHIASIS, 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    Allergies  Allergen Reactions  . Lisinopril Cough    Cough  . Tramadol Hcl     REACTION: unspecified    Prior to Admission medications   Medication Sig Start Date End Date Taking? Authorizing Provider  Ascorbic Acid (VITAMIN C PO) Take 1 tablet by mouth daily.   Yes [provider]  benzonatate (TESSALON)  200 MG capsule Take 1 capsule (200 mg total) by mouth 2 (two) times daily as needed for cough. 12/11/19  Yes Isaac Bliss, Rayford Halsted, MD  cetirizine (ZYRTEC) 10 MG tablet Take 1 tablet (10 mg total) by mouth daily. 10/12/19  Yes Isaac Bliss, Rayford Halsted, MD  clonazePAM (KLONOPIN) 0.5 MG tablet Take 0.5 tablets (0.25 mg total) by mouth at bedtime as needed for anxiety. 12/11/19  Yes Isaac Bliss, Rayford Halsted, MD  diphenhydrAMINE (BENADRYL) 25 MG tablet Take 25 mg by mouth daily as needed for allergies.   Yes [provider]  fluticasone (FLONASE) 50 MCG/ACT nasal spray Place 2 sprays into both nostrils daily. 12/04/19  Yes Isaac Bliss, Rayford Halsted, MD  latanoprost (XALATAN) 0.005 %  ophthalmic solution Place 1 drop into both eyes at bedtime. 06/20/17  Yes Marletta Lor, MD  levocetirizine (XYZAL) 5 MG tablet TAKE 1 TABLET BY MOUTH  EVERY EVENING 12/30/17  Yes Marletta Lor, MD  losartan (COZAAR) 100 MG tablet Take 1 tablet (100 mg total) by mouth daily. 07/20/19  Yes Isaac Bliss, Rayford Halsted, MD  MAGNESIUM PO Take 1 tablet by mouth daily.   Yes [provider]  omeprazole (PRILOSEC) 20 MG capsule TAKE 1 CAPSULE BY MOUTH  DAILY 09/19/19  Yes Isaac Bliss, Rayford Halsted, MD  ondansetron (ZOFRAN) 4 MG tablet Take 1 tablet (4 mg total) by mouth every 8 (eight) hours as needed for nausea or vomiting. 11/19/19  Yes Burchette, Alinda Sierras, MD  pantoprazole (PROTONIX) 40 MG tablet Take 1 tablet (40 mg total) by mouth daily. 02/18/19  Yes Gherghe, Vella Redhead, MD  psyllium (HYDROCIL/METAMUCIL) 95 % PACK Take 1 packet by mouth 2 (two) times daily. 02/18/19  Yes Gherghe, Vella Redhead, MD  rOPINIRole (REQUIP) 0.25 MG tablet Take 1 tablet (0.25 mg total) by mouth at bedtime. 10/17/19  Yes Isaac Bliss, Rayford Halsted, MD  tadalafil (CIALIS) 20 MG tablet Take 1 tablet (20 mg total) by mouth daily as needed for erectile dysfunction. 10/12/19  Yes Isaac Bliss, Rayford Halsted, MD  tamsulosin (FLOMAX) 0.4 MG CAPS capsule Take 1 capsule (0.4 mg total) by mouth daily. 12/05/19  Yes Isaac Bliss, Rayford Halsted, MD  testosterone cypionate (DEPOTESTOTERONE CYPIONATE) 100 MG/ML injection INJECT 100 MG (1 ML) AS DIRECTED EVERY 14 DAYS 04/13/19  Yes [provider]  Testosterone Cypionate 100 MG/ML SOLN Inject 100 mg as directed every 14 (fourteen) days. 12/05/18  Yes Isaac Bliss, Rayford Halsted, MD  potassium chloride SA (K-DUR) 20 MEQ tablet Take 1 tablet (20 mEq total) by mouth 2 (two) times daily for 3 days. 07/20/19 07/23/19  Erline Hau, MD    Past Medical, Surgical Family and Social History reviewed and updated.    Objective:   Today's Vitals   12/12/19 1759  BP: (!)  160/90  Pulse: 74  Resp: 14  Temp: 97.8 F (36.6 C)  SpO2: 96%  Weight: 172 lb (78 kg)  Height: 5\' 5"  (1.651 m)    Wt Readings from Last 3 Encounters:  12/12/19 172 lb (78 kg)  10/12/19 177 lb 11.2 oz (80.6 kg)  05/02/19 177 lb 4.8 oz (80.4 kg)     Physical Exam General appearance: alert, well developed, well nourished, cooperative and in no distress Head: Normocephalic, without obvious abnormality, atraumatic Respiratory: Respirations even and unlabored, normal respiratory rate Heart: rate and rhythm normal. No gallop or murmurs noted on exam  Extremities: No gross deformities Skin: Skin color, texture, turgor normal. No rashes seen  Psych: Appropriate mood and affect. Neurologic: Mental status: Alert, oriented to person, place, and time, thought content appropriate.  No results found for: POCGLU  No results found for: HGBA1C          Assessment & Plan:  There are no diagnoses linked to this encounter.     -The patient was given clear instructions to go to ER or return to medical center if symptoms do not improve, worsen or new problems develop. The patient verbalized understanding.

## 2019-12-13 ENCOUNTER — Other Ambulatory Visit: Payer: Self-pay

## 2019-12-13 ENCOUNTER — Ambulatory Visit (HOSPITAL_COMMUNITY)
Admission: RE | Admit: 2019-12-13 | Discharge: 2019-12-13 | Disposition: A | Discharge status: Home / Self Care | Payer: Medicare Other | Source: Ambulatory Visit | Attending: Family Medicine | Admitting: Family Medicine

## 2019-12-13 DIAGNOSIS — U071 COVID-19: Secondary | ICD-10-CM | POA: Insufficient documentation

## 2019-12-13 DIAGNOSIS — J40 Bronchitis, not specified as acute or chronic: Secondary | ICD-10-CM

## 2019-12-13 DIAGNOSIS — R05 Cough: Secondary | ICD-10-CM | POA: Diagnosis not present

## 2019-12-14 ENCOUNTER — Telehealth: Payer: Self-pay | Admitting: Family Medicine

## 2019-12-14 DIAGNOSIS — J40 Bronchitis, not specified as acute or chronic: Secondary | ICD-10-CM

## 2019-12-14 DIAGNOSIS — U071 COVID-19: Secondary | ICD-10-CM

## 2019-12-14 DIAGNOSIS — J189 Pneumonia, unspecified organism: Secondary | ICD-10-CM

## 2019-12-14 NOTE — Telephone Encounter (Signed)
Left voicemail for pt to call back at the clinic [phone].

## 2019-12-14 NOTE — Telephone Encounter (Signed)
Please follow-up with patient ensure his symptoms are improving. He is currently on an antibiotic and should already be scheduled to follow-up in the respiratory clinic in 2 weeks. I would like him to have a repeat chest x-ray 1 day prior to his appointment at the respiratory clinic. I'll place an order for the x-ray.   For x-ray imaging: [facility]  [place], [place], [place] [address] Enter through [place] and request x-ray department. Hours: 8-5 pm

## 2019-12-17 NOTE — Addendum Note (Signed)
Addended by: Scot Jun on: 12/17/2019 06:11 PM   Modules accepted: Orders

## 2019-12-17 NOTE — Progress Notes (Signed)
[name], @GENDER @     DOB: July 26, 1941 .   MRN: [id number]   PCP: David Goodman, David Halsted, MD   Chief Complaint  Patient presents with  . Shortness of Breath  . covid 19  . coughing     Subjective:  HPI David Goodman presents to [facility] [facility]  for evaluation of the following symptoms shortness of breath, cough, fatigue, chest tightness with breathing, related to a POSITIVE COVID-19 infection on 11/13/19.  Referred to clinic by PCP following a telemedicine 12/11/19 in which provider referred patient for face- to- face evaluation due to concern for worsening symptoms.  Abnormal chest x-ray since diagnosis of COVID-19? N   Treatment prescribed for symptoms include: Tessalon pearls and Flonase. No albuterol inhaler or antimicrobial prescribed within the last 30 days.  He is afebrile. Feels symptoms are improving, however, cough and shortness of breath are recurring more frequently.  Patient is high risk for complications related to COVID-19 infection due to the following comorbidities: hypertension, overweight, and age.  Allergies as of 12/12/2019      Reactions   Lisinopril Cough   Cough   Tramadol Hcl    REACTION: unspecified      Medication List       Accurate as of December 12, 2019 11:59 PM. If you have any questions, ask your nurse or doctor.        AeroChamber [employer] Use with inhaler   benzonatate 200 MG capsule Commonly known as: TESSALON Take 1 capsule (200 mg total) by mouth 2 (two) times daily as needed for cough.   cefdinir 300 MG capsule Commonly known as: OMNICEF Take 2 capsules (600 mg total) by mouth daily.   cetirizine 10 MG tablet Commonly known as: ZYRTEC Take 1 tablet (10 mg total) by mouth daily.   clonazePAM 0.5 MG tablet Commonly known as: KLONOPIN Take 0.5 tablets (0.25 mg total) by mouth at bedtime as needed for anxiety.   diphenhydrAMINE 25 MG tablet Commonly known as: BENADRYL Take 25 mg by  mouth daily as needed for allergies.   fluticasone 50 MCG/ACT nasal spray Commonly known as: FLONASE Place 2 sprays into both nostrils daily.   HYDROcodone-homatropine 5-1.5 MG/5ML syrup Commonly known as: HYCODAN Take 5 mLs by mouth every 8 (eight) hours as needed for cough.   latanoprost 0.005 % ophthalmic solution Commonly known as: XALATAN Place 1 drop into both eyes at bedtime.   levocetirizine 5 MG tablet Commonly known as: XYZAL TAKE 1 TABLET BY MOUTH  EVERY EVENING   losartan 100 MG tablet Commonly known as: COZAAR Take 1 tablet (100 mg total) by mouth daily.   MAGNESIUM PO Take 1 tablet by mouth daily.   omeprazole 20 MG capsule Commonly known as: PRILOSEC TAKE 1 CAPSULE BY MOUTH  DAILY   ondansetron 4 MG tablet Commonly known as: Zofran Take 1 tablet (4 mg total) by mouth every 8 (eight) hours as needed for nausea or vomiting.   pantoprazole 40 MG tablet Commonly known as: PROTONIX Take 1 tablet (40 mg total) by mouth daily.   potassium chloride SA 20 MEQ tablet Commonly known as: KLOR-CON Take 1 tablet (20 mEq total) by mouth 2 (two) times daily for 3 days.   predniSONE 20 MG tablet Commonly known as: DELTASONE Take 2 tablets (40 mg total) by mouth daily with breakfast for 5 days.   psyllium 95 % Pack Commonly known as: HYDROCIL/METAMUCIL Take 1 packet by mouth 2 (two) times daily.  rOPINIRole 0.25 MG tablet Commonly known as: Requip Take 1 tablet (0.25 mg total) by mouth at bedtime.   tadalafil 20 MG tablet Commonly known as: Cialis Take 1 tablet (20 mg total) by mouth daily as needed for erectile dysfunction.   tamsulosin 0.4 MG Caps capsule Commonly known as: FLOMAX Take 1 capsule (0.4 mg total) by mouth daily.   Testosterone Cypionate 100 MG/ML Soln Inject 100 mg as directed every 14 (fourteen) days.   testosterone cypionate 100 MG/ML injection Commonly known as: DEPOTESTOTERONE CYPIONATE INJECT 100 MG (1 ML) AS DIRECTED EVERY 14  DAYS   VITAMIN C PO Take 1 tablet by mouth daily.         Tobacco Use: Low Risk   . Smoking Tobacco Use: Never Smoker  . Smokeless Tobacco Use: Never Used        Objective:   Today's Vitals   12/12/19 1759  BP: (!) 160/90  Pulse: 74  Resp: 14  Temp: 97.8 F (36.6 C)  SpO2: 96%  Weight: 172 lb (78 kg)  Height: 5\' 5"  (1.651 m)    Wt Readings from Last 3 Encounters:  12/12/19 172 lb (78 kg)  10/12/19 177 lb 11.2 oz (80.6 kg)  05/02/19 177 lb 4.8 oz (80.4 kg)   Physical Exam Constitutional:      Appearance: Normal appearance.  Pulmonary:     Effort: Prolonged expiration present. No accessory muscle usage or retractions.     Breath sounds: Decreased air movement present. Decreased breath sounds and rales present. No wheezing or rhonchi.  Skin:    General: Skin is warm and dry.  Neurological:     Mental Status: He is alert.  Psychiatric:        Attention and Perception: Attention normal.        Mood and Affect: Mood normal.       Assessment & Plan:  1. Bronchitis due to COVID-19 virus 2. Cough 3. Shortness of breath Patient diagnosed with Covid-19 3 weeks prior presents with worsening shortness of breath and cough. Lung example concerning for pneumonia given the presence of rales on auscultation of the lungs. Chest x-ray ordered and pending.  -Start albuterol (VENTOLIN HFA) 108 (90 Base) MCG/ACT inhaler   1-2 puff -Cefdinir 300 mg BID x 2  -Prednisone 40 mg x 5 days. Return 10 days for follow-up here in respiratory clinic.    Meds ordered this encounter  Medications  . predniSONE (DELTASONE) 20 MG tablet    Sig: Take 2 tablets (40 mg total) by mouth daily with breakfast for 5 days.    Dispense:  10 tablet    Refill:  0  . HYDROcodone-homatropine (HYCODAN) 5-1.5 MG/5ML syrup    Sig: Take 5 mLs by mouth every 8 (eight) hours as needed for cough.    Dispense:  100 mL    Refill:  0  . albuterol (VENTOLIN HFA) 108 (90 Base) MCG/ACT inhaler 1-2 puff  .  Spacer/Aero-Holding Chambers (AEROCHAMBER MINI CHAMBER) DEVI    Sig: Use with inhaler    Dispense:  1 each    Refill:  0  . cefdinir (OMNICEF) 300 MG capsule    Sig: Take 2 capsules (600 mg total) by mouth daily.    Dispense:  20 capsule    Refill:  0    -The patient was given clear instructions to go to ER or return to medical center if symptoms do not improve, worsen or new problems develop. The patient verbalized understanding.  Molli Barrows, FNP-C [facility], PRN Provider  [facility]. [place], [place] Clinic Phone: [phone] Clinic Fax: [phone] Clinic Hours: 5:30 pm -7:30 pm (Monday-Friday)

## 2019-12-18 ENCOUNTER — Encounter: Payer: Self-pay | Admitting: Internal Medicine

## 2019-12-26 ENCOUNTER — Ambulatory Visit (INDEPENDENT_AMBULATORY_CARE_PROVIDER_SITE_OTHER): Payer: Medicare Other | Admitting: Family Medicine

## 2019-12-26 ENCOUNTER — Ambulatory Visit: Payer: Medicare Other | Admitting: Internal Medicine

## 2019-12-26 VITALS — BP 140/90 | HR 80 | Temp 98.1°F | Ht 65.0 in | Wt 174.0 lb

## 2019-12-26 DIAGNOSIS — R0602 Shortness of breath: Secondary | ICD-10-CM | POA: Diagnosis not present

## 2019-12-26 DIAGNOSIS — Z8616 Personal history of COVID-19: Secondary | ICD-10-CM

## 2019-12-26 DIAGNOSIS — J8410 Pulmonary fibrosis, unspecified: Secondary | ICD-10-CM

## 2019-12-26 MED ORDER — ALBUTEROL SULFATE HFA 108 (90 BASE) MCG/ACT IN AERS: 2.0000 | INHALATION_SPRAY | RESPIRATORY_TRACT | Status: DC | PRN

## 2019-12-26 NOTE — Patient Instructions (Signed)
Chest x-ray 8:00-4:30 pm tomorrow For x-ray imaging: [facility]  [place], [place], [place] [address] Enter through Main Entrance and request x-ray department. You will be contacted either via phone or via [place] with your x-ray result.    Shortness of Breath, Adult Shortness of breath means you have trouble breathing. Shortness of breath could be a sign of a medical problem. Follow these instructions at home:   Watch for any changes in your symptoms.  Do not use any products that contain nicotine or tobacco, such as cigarettes, e-cigarettes, and chewing tobacco.  Do not smoke. Smoking can cause shortness of breath. If you need help to quit smoking, ask your doctor.  Avoid things that can make it harder to breathe, such as: ? Mold. ? Dust. ? Air pollution. ? Chemical smells. ? Things that can cause allergy symptoms (allergens), if you have allergies.  Keep your living space clean. Use products that help remove mold and dust.  Rest as needed. Slowly return to your normal activities.  Take over-the-counter and prescription medicines only as told by your doctor. This includes oxygen therapy and inhaled medicines.  Keep all follow-up visits as told by your doctor. This is important. Contact a doctor if:  Your condition does not get better as soon as expected.  You have a hard time doing your normal activities, even after you rest.  You have new symptoms. Get help right away if:  Your shortness of breath gets worse.  You have trouble breathing when you are resting.  You feel light-headed or you pass out (faint).  You have a cough that is not helped by medicines.  You cough up blood.  You have pain with breathing.  You have pain in your chest, arms, shoulders, or belly (abdomen).  You have a fever.  You cannot walk up stairs.  You cannot exercise the way you normally do. These symptoms may represent a serious problem that is an emergency. Do  not wait to see if the symptoms will go away. Get medical help right away. Call your local emergency services (911 in the [place] Do not drive yourself to the hospital. Summary  Shortness of breath is when you have trouble breathing enough air. It can be a sign of a medical problem.  Avoid things that make it hard for you to breathe, such as smoking, pollution, mold, and dust.  Watch for any changes in your symptoms. Contact your doctor if you do not get better or you get worse. This information is not intended to replace advice given to you by your health care provider. Make sure you discuss any questions you have with your health care provider. Document Revised: 04/17/2018 Document Reviewed: 04/17/2018 Elsevier Patient Education  [place].

## 2019-12-26 NOTE — Progress Notes (Signed)
[name], male    DOB: 01-Aug-1941, 79 y.o.   MRN: [id number]  PCP: David Goodman, David Halsted, MD  Chief Complaint  Patient presents with  . chest congestion    Subjective:  HPI David Goodman is a 79 y.o. male presents to [facility] for evaluation of symptoms related to COVID-19 infection diagnosed in December  2020.  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  While he was in the clinic he was having difficulty with shortness of breath, and rales were  noted on lung exam.  Completed 10 days of cefdinir and today reports significant improvement of cough. He continues to experience mild residual check tightness and shortness of breath with even mild exertional activities.  Uses albuterol inhaler as needed and improve symptoms of SOB.  Review of Systems Pertinent negatives listed in HPI Patient Active Problem List   Diagnosis Date Noted  . RLS (restless legs syndrome) 10/17/2019  . Acute GI bleeding 02/13/2019  . Syncope 11/15/2018  . Diverticulitis 05/25/2018  . Acute blood loss anemia 05/25/2018  . GI bleeding 05/24/2018  . 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 Impaired glucose tolerance 09/27/2016  . GERD (gastroesophageal reflux disease) 09/27/2016  . BPH (benign prostatic hyperplasia) 06/18/2013  . 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 Dyslipidemia 07/13/2007  . Essential hypertension 07/13/2007  . Allergic rhinitis 07/13/2007  . PEPTIC ULCER DISEASE 07/13/2007  . NEPHROLITHIASIS, 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      Prior to Admission medications   Medication Sig Start Date End Date Taking? Authorizing Provider  Ascorbic Acid (VITAMIN C PO) Take 1 tablet by mouth daily.   Yes [provider]  benzonatate (TESSALON) 200 MG capsule Take 1  capsule (200 mg total) by mouth 2 (two) times daily as needed for cough. 12/11/19  Yes David Goodman, David Halsted, MD  cetirizine (ZYRTEC) 10 MG tablet Take 1 tablet (10 mg total) by mouth daily. 10/12/19  Yes David Goodman, David Halsted, MD  clonazePAM (KLONOPIN) 0.5 MG tablet Take 0.5 tablets (0.25 mg total) by mouth at bedtime as needed for anxiety. 12/11/19  Yes David Goodman, David Halsted, MD  diphenhydrAMINE (BENADRYL) 25 MG tablet Take 25 mg by mouth daily as needed for allergies.   Yes [provider]  fluticasone (FLONASE) 50 MCG/ACT nasal spray Place 2 sprays into both nostrils daily. 12/04/19  Yes David Goodman, David Halsted, MD  HYDROcodone-homatropine [facility]) 5-1.5 MG/5ML syrup Take 5 mLs by mouth every 8 (eight) hours as needed for cough. 12/12/19  Yes Scot Jun, FNP  latanoprost (XALATAN) 0.005 % ophthalmic solution Place 1 drop into both eyes at bedtime. 06/20/17  Yes Marletta Lor, MD  levocetirizine (XYZAL) 5 MG tablet TAKE 1 TABLET BY MOUTH  EVERY EVENING 12/30/17  Yes Marletta Lor, MD  losartan (COZAAR) 100 MG tablet Take 1 tablet (100 mg total) by mouth daily. 07/20/19  Yes David Goodman, David Halsted, MD  MAGNESIUM PO Take 1 tablet by mouth daily.   Yes [provider]  omeprazole (PRILOSEC) 20 MG capsule TAKE 1 CAPSULE BY MOUTH  DAILY 09/19/19  Yes David Goodman, David Halsted, MD  ondansetron (ZOFRAN) 4 MG tablet Take 1 tablet (4 mg total) by mouth every 8 (eight) hours as needed for nausea or vomiting. 11/19/19  Yes Burchette, Alinda Sierras, MD  pantoprazole (  PROTONIX) 40 MG tablet Take 1 tablet (40 mg total) by mouth daily. 02/18/19  Yes Gherghe, Vella Redhead, MD  psyllium (HYDROCIL/METAMUCIL) 95 % PACK Take 1 packet by mouth 2 (two) times daily. 02/18/19  Yes Gherghe, Vella Redhead, MD  rOPINIRole (REQUIP) 0.25 MG tablet Take 1 tablet (0.25 mg total) by mouth at bedtime. 10/17/19  Yes David Goodman, David Halsted, MD  Spacer/Aero-Holding Chambers (AEROCHAMBER MINI  CHAMBER) DEVI Use with inhaler 12/12/19  Yes Scot Jun, FNP  tadalafil (CIALIS) 20 MG tablet Take 1 tablet (20 mg total) by mouth daily as needed for erectile dysfunction. 10/12/19  Yes David Goodman, David Halsted, MD  tamsulosin (FLOMAX) 0.4 MG CAPS capsule Take 1 capsule (0.4 mg total) by mouth daily. 12/05/19  Yes David Goodman, David Halsted, MD  testosterone cypionate (DEPOTESTOTERONE CYPIONATE) 100 MG/ML injection INJECT 100 MG (1 ML) AS DIRECTED EVERY 14 DAYS 04/13/19  Yes [provider]  Testosterone Cypionate 100 MG/ML SOLN Inject 100 mg as directed every 14 (fourteen) days. 12/05/18  Yes David Goodman, David Halsted, MD  potassium chloride SA (K-DUR) 20 MEQ tablet Take 1 tablet (20 mEq total) by mouth 2 (two) times daily for 3 days. 07/20/19 07/23/19  Erline Hau, MD    Past Medical, Surgical Family and Social History reviewed and updated.    Objective:   Today's Vitals   12/26/19 1728  BP: 140/90  Pulse: 80  Temp: 98.1 F (36.7 C)  SpO2: 94%  Weight: 174 lb (78.9 kg)  Height: 5\' 5"  (1.651 m)    Wt Readings from Last 3 Encounters:  12/26/19 174 lb (78.9 kg)  12/12/19 172 lb (78 kg)  10/12/19 177 lb 11.2 oz (80.6 kg)     Physical Exam Constitutional: Patient appears well-nourished. No distress. HENT: Normocephalic, atraumatic, External right and left ear normal. Oropharynx is clear and moist.  Eyes: Conjunctivae and EOM are normal. PERRLA, no scleral icterus. Neck: Normal ROM. Neck supple. No JVD. No tracheal deviation. No thyromegaly. CVS: RRR, S1/S2 +, no murmurs, no gallops, no carotid bruit.  Pulmonary: Effort normal and diminished lung sounds bilaterally  no stridor, rhonchi, wheezes, rales. Dry cough noted during exam  Abdominal: Soft. BS +, + distension, no tenderness, no rebound or guarding.  Neuro: Alert. Normal reflexes, muscle tone coordination. Skin: Skin is warm and dry. No rash noted. Not diaphoretic. No erythema. No  pallor. Psychiatric: Normal mood and affect. Behavior, judgment, thought content normal  Assessment & Plan:  1. Shortness of breath, exertional and with activity. Continue albuterol (VENTOLIN HFA) 108 (90 Base) MCG/ACT inhaler 2 puff and use 30 minutes prior to any exertional activities.  Encouraged deep breathing and activity as tolerated Repeating CXR   2. 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 Dx > 4 weeks ago  3. Fibrotic lung diseases ([place]) -recent chest x-ray revealed post inflammatory fibrosis -repeat chest x-ray, if abnormal refer to pulmonology as patient will require close monitoring of on-going shortness of breath .     -The patient was given clear instructions to go to ER or return to medical center if symptoms do not improve, worsen or new problems develop. The patient verbalized understanding.     Molli Barrows, FNP-C [facility], PRN Provider  [facility]. [place], [place] Clinic Phone: [phone] Clinic Fax: [phone] Clinic Hours: 5:30 pm -7:30 pm (Monday-Friday)

## 2020-01-01 ENCOUNTER — Telehealth: Payer: Self-pay | Admitting: Family Medicine

## 2020-01-01 ENCOUNTER — Other Ambulatory Visit: Payer: Self-pay | Admitting: *Deleted

## 2020-01-01 ENCOUNTER — Other Ambulatory Visit: Payer: Self-pay

## 2020-01-01 ENCOUNTER — Ambulatory Visit (HOSPITAL_COMMUNITY)
Admission: RE | Admit: 2020-01-01 | Discharge: 2020-01-01 | Disposition: A | Discharge status: Home / Self Care | Payer: Medicare Other | Source: Ambulatory Visit | Attending: Family Medicine | Admitting: Family Medicine

## 2020-01-01 DIAGNOSIS — U071 COVID-19: Secondary | ICD-10-CM | POA: Insufficient documentation

## 2020-01-01 DIAGNOSIS — J189 Pneumonia, unspecified organism: Secondary | ICD-10-CM | POA: Diagnosis present

## 2020-01-01 DIAGNOSIS — J40 Bronchitis, not specified as acute or chronic: Secondary | ICD-10-CM | POA: Diagnosis not present

## 2020-01-01 DIAGNOSIS — R918 Other nonspecific abnormal finding of lung field: Secondary | ICD-10-CM | POA: Diagnosis not present

## 2020-01-01 DIAGNOSIS — I10 Essential (primary) hypertension: Secondary | ICD-10-CM

## 2020-01-01 DIAGNOSIS — J8410 Pulmonary fibrosis, unspecified: Secondary | ICD-10-CM

## 2020-01-01 MED ORDER — LOSARTAN POTASSIUM 100 MG PO TABS: 100.0000 mg | ORAL_TABLET | Freq: Every day | ORAL | 1 refills | Status: DC

## 2020-01-01 NOTE — Addendum Note (Signed)
Addended by: Scot Jun on: 01/01/2020 05:30 PM   Modules accepted: Orders

## 2020-01-01 NOTE — Telephone Encounter (Signed)
Notify patient that his check x-ray continues show persistent pneumonia vs post inflammatory fibrosis. At this point he will need a CT scan of chest. I would recommend follow-up with his PCP and I'm also going to place a referral for patient to be seen my pulmonologist.

## 2020-01-02 NOTE — Telephone Encounter (Signed)
Forwarding msg to pt PCP.Marland KitchenJohny Goodman

## 2020-01-08 NOTE — Telephone Encounter (Signed)
I would recommend he see pulmonary, as not sure what else I could add at this point. Does he have the order for the CT chest and pulmonary referral?  [facility]

## 2020-01-08 NOTE — Telephone Encounter (Signed)
Referral already placed.  Ct ordered.

## 2020-01-08 NOTE — Addendum Note (Signed)
Addended by: Westley Hummer B on: 01/08/2020 10:51 AM   Modules accepted: Orders

## 2020-01-09 ENCOUNTER — Ambulatory Visit (INDEPENDENT_AMBULATORY_CARE_PROVIDER_SITE_OTHER): Payer: Medicare Other | Admitting: *Deleted

## 2020-01-09 ENCOUNTER — Other Ambulatory Visit: Payer: Self-pay

## 2020-01-09 DIAGNOSIS — E349 Endocrine disorder, unspecified: Secondary | ICD-10-CM

## 2020-01-09 MED ORDER — TESTOSTERONE CYPIONATE 100 MG/ML IM SOLN
100.0000 mg | Freq: Once | INTRAMUSCULAR | Status: AC
  Administered 2020-01-09: 11:00:00 100 mg via INTRAMUSCULAR

## 2020-01-09 NOTE — Progress Notes (Signed)
Per orders of Dr. Hernandez, injection of B12 given by Dazja Houchin. Patient tolerated injection well.  

## 2020-01-11 ENCOUNTER — Telehealth: Payer: Self-pay | Admitting: Internal Medicine

## 2020-01-11 NOTE — Telephone Encounter (Signed)
Patient returning call to the office- waiting for a call back.

## 2020-01-11 NOTE — Telephone Encounter (Signed)
Left detailed message on machine for patient to call [place] [phone].  Order placed.

## 2020-01-16 ENCOUNTER — Ambulatory Visit: Payer: Medicare Other

## 2020-01-18 ENCOUNTER — Ambulatory Visit
Admission: RE | Admit: 2020-01-18 | Discharge: 2020-01-18 | Disposition: A | Discharge status: Home / Self Care | Payer: Medicare Other | Source: Ambulatory Visit | Attending: Internal Medicine | Admitting: Internal Medicine

## 2020-01-18 DIAGNOSIS — J8410 Pulmonary fibrosis, unspecified: Secondary | ICD-10-CM

## 2020-01-18 DIAGNOSIS — J849 Interstitial pulmonary disease, unspecified: Secondary | ICD-10-CM | POA: Diagnosis not present

## 2020-01-18 DIAGNOSIS — J189 Pneumonia, unspecified organism: Secondary | ICD-10-CM

## 2020-01-22 ENCOUNTER — Telehealth: Payer: Self-pay | Admitting: Internal Medicine

## 2020-01-22 NOTE — Telephone Encounter (Signed)
Spoke with patient.

## 2020-01-22 NOTE — Telephone Encounter (Signed)
Pt had his 1st COVID vaccine yesterday and has a nurse visit tomorrow. Pt would like to know if it's okay, for him to come in tomorrow for his appt or does he need to wait because of the COVID vaccine? Pt would also like to know if his xray results are in. Thanks

## 2020-01-23 ENCOUNTER — Ambulatory Visit (INDEPENDENT_AMBULATORY_CARE_PROVIDER_SITE_OTHER): Payer: Medicare Other | Admitting: *Deleted

## 2020-01-23 ENCOUNTER — Other Ambulatory Visit: Payer: Self-pay

## 2020-01-23 DIAGNOSIS — E349 Endocrine disorder, unspecified: Secondary | ICD-10-CM

## 2020-01-23 MED ORDER — TESTOSTERONE CYPIONATE 100 MG/ML IM SOLN
100.0000 mg | Freq: Once | INTRAMUSCULAR | Status: AC
  Administered 2020-01-23: 17:00:00 100 mg via INTRAMUSCULAR

## 2020-01-23 NOTE — Progress Notes (Signed)
Per orders of Dorothyann Peng NP, injection of testosterone given by Westley Hummer. Patient tolerated injection well.

## 2020-02-06 ENCOUNTER — Other Ambulatory Visit: Payer: Self-pay

## 2020-02-06 ENCOUNTER — Encounter: Payer: Self-pay | Admitting: Internal Medicine

## 2020-02-06 ENCOUNTER — Telehealth: Payer: Self-pay | Admitting: Internal Medicine

## 2020-02-06 ENCOUNTER — Ambulatory Visit: Payer: Medicare Other | Admitting: Internal Medicine

## 2020-02-06 VITALS — BP 140/82 | HR 75 | Temp 97.0°F | Ht 65.0 in | Wt 174.0 lb

## 2020-02-06 DIAGNOSIS — R9389 Abnormal findings on diagnostic imaging of other specified body structures: Secondary | ICD-10-CM

## 2020-02-06 LAB — C-REACTIVE PROTEIN: CRP: 1.4 mg/dL (ref 0.5–20.0)

## 2020-02-06 MED ORDER — BREO ELLIPTA 100-25 MCG/INH IN AEPB: 1.0000 | INHALATION_SPRAY | Freq: Every day | RESPIRATORY_TRACT | 0 refills | Status: DC

## 2020-02-06 MED ORDER — BREO ELLIPTA 100-25 MCG/INH IN AEPB: 1.0000 | INHALATION_SPRAY | Freq: Every day | RESPIRATORY_TRACT | 3 refills | Status: DC

## 2020-02-06 MED ORDER — PREDNISONE 20 MG PO TABS: ORAL_TABLET | ORAL | 0 refills | Status: AC

## 2020-02-06 NOTE — Patient Instructions (Addendum)
-   Breo sample, only use if (A) it helps and (B) it is affordable.  If it is not affordable call our office and we can find an alternative equivalent medication. - Fill the prednisone before you go to [place] blood work today - Get your second vaccine today - I will call you in 1 week: [phone] regarding whether to start prednisone or not - Follow up with me in 4 weeks

## 2020-02-06 NOTE — Telephone Encounter (Signed)
Called and spoke with Patient.  Patient stated he was rescheduled for his Covid vaccine for 02/20/20.  Patient asked if he should start Prednisone now, or wait until after Covid vaccine. Went over AVS instructions with Patient.  Per AVS,  Dr. Tamala Julian will contact Patient in 1 week regarding to start prednisone or not. Advised Patient to keep 02/20/20 vaccine appointment, and discuss with Dr. Tamala Julian, once he gives prednisone instruction.  Understanding stated.  Nothing further at this time.    OV instructions from Dr.Smith 02/06/20 Instructions    Return in about 4 weeks (around 03/05/2020). - Breo sample, only use if (A) it helps and (B) it is affordable.  If it is not affordable call our office and we can find an alternative equivalent medication. - Fill the prednisone before you go to [place] blood work today - Get your second vaccine today - I will call you in 1 week: [phone] regarding whether to start prednisone or not - Follow up with me in 4 weeks

## 2020-02-06 NOTE — Progress Notes (Unsigned)
    02/06/20   To Whom It May Concern:   David Goodman  03/18/1941   Please give this patient his second COVID vaccine a few days early as he is going out of [place] and would prefer he not delay second dose.  He has a medical condition that warrants expedited vaccination.  Please call my office if questions or concerns.     Erskine Emery MD [facility] 02/06/2020 9:21 AM Personal pager: [phone]

## 2020-02-06 NOTE — Progress Notes (Signed)
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  Tricky part here is he is due for his second COVID vaccine.  Want to see if he can get this and wait a week or two before starting prednisone to minimize interference with neutralizing antibody production (although he should already have pretty good immunity from his initial infection plus initial vaccine)  - Check CRP - Call patient in 1 week to discuss timing of starting prednisone:  40mg  x 2 weeks then 20mg  x 2 weeks, he will fill today but not start taking because he is going out of town for 3 weeks starting this weekend - Breo inhaler, fill if helps and is affordable - f/u in 4 weeks  MDM2 . I reviewed prior external note(s) from Molli Barrows NP on 12/26/19 . I reviewed the result(s) of COVID positivity on 11/12/19, Eosinophil count 800 in 05/01/20 . 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 The patient's images have been independently reviewed by me.     End of visit medications:  Current Outpatient Medications:  .  Ascorbic Acid (VITAMIN C PO), Take 1 tablet by mouth daily., Disp: , Rfl:  .  benzonatate (TESSALON) 200 MG capsule, Take 1 capsule (200 mg total) by mouth 2 (two) times daily as needed for cough., Disp: 20 capsule, Rfl: 0 .  cetirizine (ZYRTEC) 10 MG tablet, Take 1 tablet (10 mg total) by mouth daily., Disp: 30 tablet, Rfl: 11 .  clonazePAM (KLONOPIN) 0.5 MG tablet, Take 0.5 tablets (0.25 mg total) by mouth at bedtime as needed for anxiety., Disp: 20 tablet, Rfl: 1 .  diphenhydrAMINE (BENADRYL) 25 MG tablet, Take 25 mg by mouth daily as needed for allergies., Disp: , Rfl:  .  fluticasone (FLONASE) 50 MCG/ACT nasal  spray, Place 2 sprays into both nostrils daily., Disp: 16 g, Rfl: 6 .  fluticasone furoate-vilanterol (BREO ELLIPTA) 100-25 MCG/INH AEPB, Inhale 1 puff into the lungs daily., Disp: 30 each, Rfl: 3 .  HYDROcodone-homatropine (HYCODAN) 5-1.5 MG/5ML syrup, Take 5 mLs by mouth every 8 (eight) hours as needed for cough., Disp: 100 mL, Rfl: 0 .  latanoprost (XALATAN) 0.005 % ophthalmic solution, Place 1 drop into both eyes at bedtime., Disp: 7.5 mL, Rfl: 1 .  levocetirizine (XYZAL) 5 MG tablet, TAKE 1 TABLET BY MOUTH  EVERY EVENING, Disp: 90 tablet, Rfl: 2 .  losartan (COZAAR) 100 MG tablet, Take 1 tablet (100 mg total) by mouth daily., Disp: 90 tablet, Rfl: 1 .  MAGNESIUM PO, Take 1 tablet by mouth daily., Disp: , Rfl:  .  omeprazole (PRILOSEC) 20 MG capsule, TAKE 1 CAPSULE BY MOUTH  DAILY, Disp: 90 capsule, Rfl: 3 .  ondansetron (ZOFRAN) 4 MG tablet, Take 1 tablet (4 mg total) by mouth every 8 (eight) hours as needed for nausea or vomiting., Disp: 20 tablet, Rfl: 0 .  pantoprazole (PROTONIX) 40 MG tablet, Take 1 tablet (40 mg total) by mouth daily., Disp: 30 tablet, Rfl: 1 .  predniSONE (DELTASONE) 20 MG tablet, Take 2 tablets (40 mg total) by mouth daily with breakfast for 14 days, THEN 1 tablet (20 mg total) daily with breakfast for 14 days., Disp: 42 tablet, Rfl: 0 .  psyllium (HYDROCIL/METAMUCIL) 95 % PACK, Take  1 packet by mouth 2 (two) times daily., Disp: 60 each, Rfl: 1 .  rOPINIRole (REQUIP) 0.25 MG tablet, Take 1 tablet (0.25 mg total) by mouth at bedtime., Disp: 90 tablet, Rfl: 0 .  Spacer/Aero-Holding Chambers (AEROCHAMBER MINI CHAMBER) DEVI, Use with inhaler, Disp: 1 each, Rfl: 0 .  tadalafil (CIALIS) 20 MG tablet, Take 1 tablet (20 mg total) by mouth daily as needed for erectile dysfunction., Disp: 30 tablet, Rfl: 2 .  tamsulosin (FLOMAX) 0.4 MG CAPS capsule, Take 1 capsule (0.4 mg total) by mouth daily., Disp: 90 capsule, Rfl: 0 .  testosterone cypionate (DEPOTESTOTERONE CYPIONATE) 100  MG/ML injection, INJECT 100 MG (1 ML) AS DIRECTED EVERY 14 DAYS, Disp: , Rfl:  .  Testosterone Cypionate 100 MG/ML SOLN, Inject 100 mg as directed every 14 (fourteen) days., Disp: 1 vial, Rfl: 1 .  potassium chloride SA (K-DUR) 20 MEQ tablet, Take 1 tablet (20 mEq total) by mouth 2 (two) times daily for 3 days., Disp: 6 tablet, Rfl: 0  Current Facility-Administered Medications:  .  albuterol (VENTOLIN HFA) 108 (90 Base) MCG/ACT inhaler 2 puff, 2 puff, Inhalation, Q4H PRN, Scot Jun, FNP .  testosterone cypionate (DEPOTESTOSTERONE CYPIONATE) injection 100 mg, 100 mg, Intramuscular, Q14 Days, Isaac Bliss, Rayford Halsted, MD, 100 mg at 11/02/19 1024 .  testosterone cypionate (DEPOTESTOTERONE CYPIONATE) injection 100 mg, 100 mg, Intramuscular, Q14 Days, Isaac Bliss, Rayford Halsted, MD, 100 mg at 05/14/19 0952 .  testosterone cypionate (DEPOTESTOTERONE CYPIONATE) injection 100 mg, 100 mg, Intramuscular, Q14 Days, Isaac Bliss, Rayford Halsted, MD, 100 mg at 07/16/19 Los Ybanez, MD [facility] 02/06/2020 9:59 AM    Subjective:   [name] GENDER: male DOB: Feb 14, 1941, MRN: [id number]  Chief Complaint  Patient presents with  . Consult    HPI - Here for post COVID exertional dyspnea - Diagnosed with COVID in mid December - Outpatient expectant management - Since has had DOE/wheezing on exertion, MMRC 1, also fatigue and joint pains - CT scan done for persistent dyspnea shows NSIP type changes for which he is referred to pulmonology - Denies cough, sputum production, fevers   Ancillary information including prior medications, full medical/surgical/family/social histoies, and PFTs (when available) are listed below and have been reviewed.   ROS + symptoms in bold Fevers, chills, weight loss Nausea, vomiting, diarrhea Shortness of breath, wheezing, cough Chest pain, palpitations, lower ext edema   Objective:   Vitals:   02/06/20 0931    BP: 140/82  Pulse: 75  Temp: (!) 97 F (36.1 C)  TempSrc: Temporal  SpO2: 97%  Weight: 174 lb (78.9 kg)  Height: 5\' 5"  (1.651 m)   97% on RA BMI Readings from Last 3 Encounters:  02/06/20 28.96 kg/m  12/26/19 28.96 kg/m  12/12/19 28.62 kg/m   Wt Readings from Last 3 Encounters:  02/06/20 174 lb (78.9 kg)  12/26/19 174 lb (78.9 kg)  12/12/19 172 lb (78 kg)    GEN: 79 year old man in no acute distress HEENT: trachea midline, mucus membranes moist CV: Regular rate and rhythm, extremities are warm PULM: Clear, no wheezing or rhonci GI: Soft, +BS EXT: No edema NEURO: Moves all 4 extremities   Ancillary Information    Past Medical History:  Diagnosis Date  . ALLERGIC RHINITIS 07/13/2007  . HYPERLIPIDEMIA 07/13/2007  . HYPERTENSION 07/13/2007  . 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 PEPTIC ULCER DISEASE 07/13/2007  . 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 No pertinent family history.   Past Surgical History:  Procedure Laterality Date  . COLONOSCOPY WITH PROPOFOL N/A 02/17/2019   Procedure: COLONOSCOPY WITH PROPOFOL;  Surgeon: Ronnette Juniper, MD;  Location: [facility];  Service: Gastroenterology;  Laterality: N/A;  . IR ANGIOGRAM VISCERAL SELECTIVE  02/16/2019  . IR ANGIOGRAM VISCERAL SELECTIVE  02/16/2019  . IR ANGIOGRAM VISCERAL SELECTIVE  02/16/2019  . IR US GUIDE VASC ACCESS RIGHT  02/16/2019  . ROTATOR CUFF REPAIR      Social History   Socioeconomic History  . Marital status: Married    Spouse name: Not on file  . Number of children: Not on file  . Years of education: Not on file  . Highest education level: Not on file  Occupational History  . Not on file  Tobacco Use  . Smoking status: Never Smoker  . Smokeless tobacco: Never Used  Substance and Sexual Activity  . Alcohol use: Yes    Comment: couple times a week - wine  . Drug use: No  . Sexual activity: Not Currently  Other Topics Concern  . Not on file  Social History  Narrative  . Not on file   Social Determinants of Health   Financial Resource Strain:   . Difficulty of Paying Living Expenses: Not on file  Food Insecurity:   . Worried About Charity fundraiser in the Last Year: Not on file  . Ran Out of Food in the Last Year: Not on file  Transportation Needs:   . Lack of Transportation (Medical): Not on file  . Lack of Transportation (Non-Medical): Not on file  Physical Activity:   . Days of Exercise per Week: Not on file  . Minutes of Exercise per Session: Not on file  Stress:   . Feeling of Stress : Not on file  Social Connections:   . Frequency of Communication with Friends and Family: Not on file  . Frequency of Social Gatherings with Friends and Family: Not on file  . Attends Religious Services: Not on file  . Active Member of Clubs or Organizations: Not on file  . Attends Archivist Meetings: Not on file  . Marital Status: Not on file  Intimate Partner Violence:   . Fear of Current or Ex-Partner: Not on file  . Emotionally Abused: Not on file  . Physically Abused: Not on file  . Sexually Abused: Not on file     Allergies  Allergen Reactions  . Lisinopril Cough    Cough  . Tramadol Hcl     REACTION: unspecified     CBC    Component Value Date/Time   WBC 7.9 05/02/2019 0818   RBC 4.78 05/02/2019 0818   HGB 14.2 05/02/2019 0818   HCT 44.4 10/12/2019 1006   PLT 239.0 05/02/2019 0818   MCV 86.5 05/02/2019 0818   MCH 31.8 02/18/2019 0555   MCHC 34.4 05/02/2019 0818   RDW 15.2 05/02/2019 0818   LYMPHSABS 1.2 05/02/2019 0818   MONOABS 0.5 05/02/2019 0818   EOSABS 0.8 (H) 05/02/2019 0818   BASOSABS 0.0 05/02/2019 0818    Pulmonary Functions Testing Results: No flowsheet data found.  Outpatient Medications Prior to Visit  Medication Sig Dispense Refill  . Ascorbic Acid (VITAMIN C PO) Take 1 tablet by mouth daily.    . benzonatate (TESSALON) 200 MG capsule Take 1 capsule (200 mg total) by mouth 2 (two) times  daily as needed for cough. 20 capsule 0  . cetirizine (ZYRTEC) 10  MG tablet Take 1 tablet (10 mg total) by mouth daily. 30 tablet 11  . clonazePAM (KLONOPIN) 0.5 MG tablet Take 0.5 tablets (0.25 mg total) by mouth at bedtime as needed for anxiety. 20 tablet 1  . diphenhydrAMINE (BENADRYL) 25 MG tablet Take 25 mg by mouth daily as needed for allergies.    . fluticasone (FLONASE) 50 MCG/ACT nasal spray Place 2 sprays into both nostrils daily. 16 g 6  . HYDROcodone-homatropine (HYCODAN) 5-1.5 MG/5ML syrup Take 5 mLs by mouth every 8 (eight) hours as needed for cough. 100 mL 0  . latanoprost (XALATAN) 0.005 % ophthalmic solution Place 1 drop into both eyes at bedtime. 7.5 mL 1  . levocetirizine (XYZAL) 5 MG tablet TAKE 1 TABLET BY MOUTH  EVERY EVENING 90 tablet 2  . losartan (COZAAR) 100 MG tablet Take 1 tablet (100 mg total) by mouth daily. 90 tablet 1  . MAGNESIUM PO Take 1 tablet by mouth daily.    Marland Kitchen omeprazole (PRILOSEC) 20 MG capsule TAKE 1 CAPSULE BY MOUTH  DAILY 90 capsule 3  . ondansetron (ZOFRAN) 4 MG tablet Take 1 tablet (4 mg total) by mouth every 8 (eight) hours as needed for nausea or vomiting. 20 tablet 0  . pantoprazole (PROTONIX) 40 MG tablet Take 1 tablet (40 mg total) by mouth daily. 30 tablet 1  . psyllium (HYDROCIL/METAMUCIL) 95 % PACK Take 1 packet by mouth 2 (two) times daily. 60 each 1  . rOPINIRole (REQUIP) 0.25 MG tablet Take 1 tablet (0.25 mg total) by mouth at bedtime. 90 tablet 0  . Spacer/Aero-Holding Chambers (AEROCHAMBER MINI CHAMBER) DEVI Use with inhaler 1 each 0  . tadalafil (CIALIS) 20 MG tablet Take 1 tablet (20 mg total) by mouth daily as needed for erectile dysfunction. 30 tablet 2  . tamsulosin (FLOMAX) 0.4 MG CAPS capsule Take 1 capsule (0.4 mg total) by mouth daily. 90 capsule 0  . testosterone cypionate (DEPOTESTOTERONE CYPIONATE) 100 MG/ML injection INJECT 100 MG (1 ML) AS DIRECTED EVERY 14 DAYS    . Testosterone Cypionate 100 MG/ML SOLN Inject 100 mg as  directed every 14 (fourteen) days. 1 vial 1  . potassium chloride SA (K-DUR) 20 MEQ tablet Take 1 tablet (20 mEq total) by mouth 2 (two) times daily for 3 days. 6 tablet 0   Facility-Administered Medications Prior to Visit  Medication Dose Route Frequency Provider Last Rate Last Admin  . albuterol (VENTOLIN HFA) 108 (90 Base) MCG/ACT inhaler 2 puff  2 puff Inhalation Q4H PRN Scot Jun, FNP      . testosterone cypionate (DEPOTESTOSTERONE CYPIONATE) injection 100 mg  100 mg Intramuscular Q14 Days Isaac Bliss, Rayford Halsted, MD   100 mg at 11/02/19 1024  . testosterone cypionate (DEPOTESTOTERONE CYPIONATE) injection 100 mg  100 mg Intramuscular Q14 Days Isaac Bliss, Rayford Halsted, MD   100 mg at 05/14/19 [id number]  . testosterone cypionate (DEPOTESTOTERONE CYPIONATE) injection 100 mg  100 mg Intramuscular Q14 Days Isaac Bliss, Rayford Halsted, MD   100 mg at 07/16/19 1051

## 2020-02-06 NOTE — Progress Notes (Signed)
Patient seen in the office today and instructed on use of Breo 100.  Patient expressed understanding and demonstrated technique. Parke Poisson [facility] 02/06/20

## 2020-02-06 NOTE — Addendum Note (Signed)
Addended by: Parke Poisson E on: 02/06/2020 10:44 AM   Modules accepted: Orders

## 2020-02-13 NOTE — Telephone Encounter (Signed)
Called patient and instructed to not start steroids until 1 week post vaccine.  Erskine Emery MD PCCM

## 2020-02-22 DIAGNOSIS — H401122 Primary open-angle glaucoma, left eye, moderate stage: Secondary | ICD-10-CM | POA: Diagnosis not present

## 2020-02-22 DIAGNOSIS — H401111 Primary open-angle glaucoma, right eye, mild stage: Secondary | ICD-10-CM | POA: Diagnosis not present

## 2020-02-25 ENCOUNTER — Telehealth: Payer: Self-pay | Admitting: Internal Medicine

## 2020-02-25 NOTE — Telephone Encounter (Signed)
Called and spoke to pt. Pt states he just received his second COVID vaccine on 02/22/20 and is questioning when to start his prednisone. Per Dr. Thompson Caul note pt is able to start it 1-2 weeks after the vaccine. Pt has a follow up with Dr. Tamala Julian on 4/6 for a 4 week follow up. Pt states he feels well overall.   Dr. Tamala Julian, please advise if ok to keep OV for 4/6 and if you have an exact date you would like pt to take pred. Pt was instructed 1-2 weeks after vaccine and states he could wait 2 weeks to start the pred.

## 2020-02-25 NOTE — Telephone Encounter (Signed)
Ask him to wait until appt and we can discuss together

## 2020-02-26 NOTE — Telephone Encounter (Signed)
lmtcb for pt.  

## 2020-02-27 NOTE — Telephone Encounter (Signed)
Spoke with pt. He is aware of Dr. Thompson Caul response. Nothing further was needed.

## 2020-02-28 ENCOUNTER — Other Ambulatory Visit: Payer: Self-pay | Admitting: Internal Medicine

## 2020-03-04 ENCOUNTER — Other Ambulatory Visit: Payer: Self-pay

## 2020-03-04 ENCOUNTER — Encounter: Payer: Self-pay | Admitting: Internal Medicine

## 2020-03-04 ENCOUNTER — Ambulatory Visit (INDEPENDENT_AMBULATORY_CARE_PROVIDER_SITE_OTHER): Payer: Medicare Other | Admitting: Internal Medicine

## 2020-03-04 VITALS — BP 136/72 | HR 73 | Temp 97.2°F | Ht 65.0 in | Wt 177.0 lb

## 2020-03-04 DIAGNOSIS — R9389 Abnormal findings on diagnostic imaging of other specified body structures: Secondary | ICD-10-CM | POA: Diagnosis not present

## 2020-03-04 DIAGNOSIS — U071 COVID-19: Secondary | ICD-10-CM

## 2020-03-04 NOTE — Patient Instructions (Signed)
-   Start the steroids for your lingering shortness of breath and fatigue - Follow up with Dr. Wonda Amis in 4-6 weeks - Call if you have any questions or concerns

## 2020-03-04 NOTE — Progress Notes (Signed)
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  CRP essentially normal.  Given GGO and lingering fatigue/joint aches, I am inclined to give him trial of steroids. - Prednisone 40mg /day x 2 weeks then 20mg /day x 2 weeks - f/u w/ Dr. Wonda Amis in 4-6 weeks to establish care in the post-COVID ILD cohort  MDM 34 minutes spent today for this visit of which > 50% was spent face to face.  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 The patient's images have been independently reviewed by me.     End of visit medications:  Current Outpatient Medications:  .  Ascorbic Acid (VITAMIN C PO), Take 1 tablet by mouth daily., Disp: , Rfl:  .  clonazePAM (KLONOPIN) 0.5 MG tablet, Take 0.5 tablets (0.25 mg total) by mouth at bedtime as needed for anxiety., Disp: 20 tablet, Rfl: 1 .  diphenhydrAMINE (BENADRYL) 25 MG tablet, Take 25 mg by mouth daily as needed for allergies., Disp: , Rfl:  .  fluticasone (FLONASE) 50 MCG/ACT nasal spray, Place 2 sprays into both nostrils daily., Disp: 16 g, Rfl: 6 .  HYDROcodone-homatropine (HYCODAN) 5-1.5 MG/5ML syrup, Take 5 mLs by mouth every 8 (eight) hours as needed for cough., Disp: 100 mL, Rfl: 0 .  latanoprost (XALATAN) 0.005 % ophthalmic solution, Place 1 drop into both eyes at bedtime., Disp: 7.5 mL, Rfl: 1 .  levocetirizine (XYZAL) 5 MG tablet, TAKE 1 TABLET BY MOUTH  EVERY EVENING, Disp: 90 tablet, Rfl: 2 .  losartan (COZAAR) 100 MG tablet, Take 1 tablet (100 mg total) by mouth daily., Disp: 90 tablet, Rfl: 1 .  MAGNESIUM PO, Take 1 tablet by mouth daily., Disp: , Rfl:  .  naproxen (NAPROSYN) 500 MG tablet, TAKE 1 TABLET BY MOUTH TWO  TIMES DAILY WITH MEALS, Disp: 180 tablet, Rfl: 1 .  omeprazole (PRILOSEC) 20 MG capsule,  TAKE 1 CAPSULE BY MOUTH  DAILY, Disp: 90 capsule, Rfl: 3 .  ondansetron (ZOFRAN) 4 MG tablet, Take 1 tablet (4 mg total) by mouth every 8 (eight) hours as needed for nausea or vomiting., Disp: 20 tablet, Rfl: 0 .  pantoprazole (PROTONIX) 40 MG tablet, Take 1 tablet (40 mg total) by mouth daily., Disp: 30 tablet, Rfl: 1 .  predniSONE (DELTASONE) 20 MG tablet, Take 2 tablets (40 mg total) by mouth daily with breakfast for 14 days, THEN 1 tablet (20 mg total) daily with breakfast for 14 days., Disp: 42 tablet, Rfl: 0 .  psyllium (HYDROCIL/METAMUCIL) 95 % PACK, Take 1 packet by mouth 2 (two) times daily., Disp: 60 each, Rfl: 1 .  rOPINIRole (REQUIP) 0.25 MG tablet, Take 1 tablet (0.25 mg total) by mouth at bedtime., Disp: 90 tablet, Rfl: 0 .  Spacer/Aero-Holding Chambers (AEROCHAMBER MINI CHAMBER) DEVI, Use with inhaler, Disp: 1 each, Rfl: 0 .  tadalafil (CIALIS) 20 MG tablet, Take 1 tablet (20 mg total) by mouth daily as needed for erectile dysfunction., Disp: 30 tablet, Rfl: 2 .  tamsulosin (FLOMAX) 0.4 MG CAPS capsule, Take 1 capsule (0.4 mg total) by mouth daily., Disp: 90 capsule, Rfl: 0 .  testosterone cypionate (DEPOTESTOTERONE CYPIONATE) 100 MG/ML injection, INJECT 100 MG (1 ML) AS DIRECTED EVERY 14 DAYS, Disp: , Rfl:  .  Testosterone Cypionate 100 MG/ML SOLN, Inject 100 mg as directed every 14 (fourteen)  days., Disp: 1 vial, Rfl: 1 .  fluticasone furoate-vilanterol (BREO ELLIPTA) 100-25 MCG/INH AEPB, Inhale 1 puff into the lungs daily. (Patient not taking: Reported on 03/04/2020), Disp: 30 each, Rfl: 3 .  potassium chloride SA (K-DUR) 20 MEQ tablet, Take 1 tablet (20 mEq total) by mouth 2 (two) times daily for 3 days., Disp: 6 tablet, Rfl: 0  Current Facility-Administered Medications:  .  albuterol (VENTOLIN HFA) 108 (90 Base) MCG/ACT inhaler 2 puff, 2 puff, Inhalation, Q4H PRN, Scot Jun, FNP .  testosterone cypionate (DEPOTESTOSTERONE CYPIONATE) injection 100 mg, 100 mg, Intramuscular,  Q14 Days, Isaac Bliss, Rayford Halsted, MD, 100 mg at 11/02/19 1024 .  testosterone cypionate (DEPOTESTOTERONE CYPIONATE) injection 100 mg, 100 mg, Intramuscular, Q14 Days, Isaac Bliss, Rayford Halsted, MD, 100 mg at 05/14/19 0952 .  testosterone cypionate (DEPOTESTOTERONE CYPIONATE) injection 100 mg, 100 mg, Intramuscular, Q14 Days, Isaac Bliss, Rayford Halsted, MD, 100 mg at 07/16/19 [place], MD [facility] 03/04/2020 9:31 AM    Subjective:   [name] GENDER: male DOB: [birth year], MRN: [id number]  Chief Complaint  Patient presents with  . Follow-up    having sob with mask at long periods, discuss prednisone    HPI Hx as below.  Held off on steroids last visit so patient could complete COVID vaccinations. Doing well, MMRC 1, still very fatigued at work Surveyor, mining facilities).  Also having back and joint aches.  "- Here for post COVID exertional dyspnea - Diagnosed with COVID in mid December - Outpatient expectant management - Since has had DOE/wheezing on exertion, MMRC 1, also fatigue and joint pains - CT scan done for persistent dyspnea shows NSIP type changes for which he is referred to pulmonology - Denies cough, sputum production, fevers"   Ancillary information including prior medications, full medical/surgical/family/social histoies, and PFTs (when available) are listed below and have been reviewed.   ROS + symptoms in bold Fevers, chills, weight loss Nausea, vomiting, diarrhea Shortness of breath, wheezing, cough Chest pain, palpitations, lower ext edema   Objective:   Vitals:   03/04/20 0900  BP: 136/72  Pulse: 73  Temp: (!) 97.2 F (36.2 C)  TempSrc: Temporal  SpO2: 99%  Weight: 177 lb (80.3 kg)  Height: 5\' 5"  (1.651 m)   99% on RA BMI Readings from Last 3 Encounters:  03/04/20 29.45 kg/m  02/06/20 28.96 kg/m  12/26/19 28.96 kg/m   Wt Readings from Last 3 Encounters:  03/04/20 177 lb (80.3  kg)  02/06/20 174 lb (78.9 kg)  12/26/19 174 lb (78.9 kg)    GEN: 79 year old man in no acute distress HEENT: trachea midline, mucus membranes moist CV: Regular rate and rhythm, extremities are warm PULM: Clear, no wheezing or rhonci GI: Soft, +BS EXT: No edema NEURO: Moves all 4 extremities   Ancillary Information    Past Medical History:  Diagnosis Date  . ALLERGIC RHINITIS 07/13/2007  . HYPERLIPIDEMIA 07/13/2007  . HYPERTENSION 07/13/2007  . 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 PEPTIC ULCER DISEASE 07/13/2007  . 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   Past Surgical History:  Procedure Laterality Date  . COLONOSCOPY WITH PROPOFOL N/A 02/17/2019   Procedure: COLONOSCOPY WITH PROPOFOL;  Surgeon: Ronnette Juniper, MD;  Location: [facility];  Service: Gastroenterology;  Laterality: N/A;  . IR ANGIOGRAM VISCERAL SELECTIVE  02/16/2019  . IR ANGIOGRAM VISCERAL SELECTIVE  02/16/2019  . IR ANGIOGRAM VISCERAL SELECTIVE  02/16/2019  . IR US GUIDE VASC ACCESS RIGHT  02/16/2019  . ROTATOR CUFF REPAIR      Social History   Socioeconomic History  . Marital status: Married    Spouse name: Not on file  . Number of children: Not on file  . Years of education: Not on file  . Highest education level: Not on file  Occupational History  . Not on file  Tobacco Use  . Smoking status: Never Smoker  . Smokeless tobacco: Never Used  Substance and Sexual Activity  . Alcohol use: Yes    Comment: couple times a week - wine  . Drug use: No  . Sexual activity: Not Currently  Other Topics Concern  . Not on file  Social History Narrative  . Not on file   Social Determinants of Health   Financial Resource Strain:   . Difficulty of Paying Living Expenses:   Food Insecurity:   . Worried About Charity fundraiser in the Last Year:   . Arboriculturist in the Last Year:   Transportation Needs:   . Film/video editor (Medical):   Marland Kitchen Lack of Transportation  (Non-Medical):   Physical Activity:   . Days of Exercise per Week:   . Minutes of Exercise per Session:   Stress:   . Feeling of Stress :   Social Connections:   . Frequency of Communication with Friends and Family:   . Frequency of Social Gatherings with Friends and Family:   . Attends Religious Services:   . Active Member of Clubs or Organizations:   . Attends Archivist Meetings:   Marland Kitchen Marital Status:   Intimate Partner Violence:   . Fear of Current or Ex-Partner:   . Emotionally Abused:   Marland Kitchen Physically Abused:   . Sexually Abused:      Allergies  Allergen Reactions  . Lisinopril Cough    Cough  . Tramadol Hcl     REACTION: unspecified     CBC    Component Value Date/Time   WBC 7.9 05/02/2019 0818   RBC 4.78 05/02/2019 0818   HGB 14.2 05/02/2019 0818   HCT 44.4 10/12/2019 1006   PLT 239.0 05/02/2019 0818   MCV 86.5 05/02/2019 0818   MCH 31.8 02/18/2019 0555   MCHC 34.4 05/02/2019 0818   RDW 15.2 05/02/2019 0818   LYMPHSABS 1.2 05/02/2019 0818   MONOABS 0.5 05/02/2019 0818   EOSABS 0.8 (H) 05/02/2019 0818   BASOSABS 0.0 05/02/2019 0818    Pulmonary Functions Testing Results: No flowsheet data found.  Outpatient Medications Prior to Visit  Medication Sig Dispense Refill  . Ascorbic Acid (VITAMIN C PO) Take 1 tablet by mouth daily.    . clonazePAM (KLONOPIN) 0.5 MG tablet Take 0.5 tablets (0.25 mg total) by mouth at bedtime as needed for anxiety. 20 tablet 1  . diphenhydrAMINE (BENADRYL) 25 MG tablet Take 25 mg by mouth daily as needed for allergies.    . fluticasone (FLONASE) 50 MCG/ACT nasal spray Place 2 sprays into both nostrils daily. 16 g 6  . HYDROcodone-homatropine (HYCODAN) 5-1.5 MG/5ML syrup Take 5 mLs by mouth every 8 (eight) hours as needed for cough. 100 mL 0  . latanoprost (XALATAN) 0.005 % ophthalmic solution Place 1 drop into both eyes at bedtime. 7.5 mL 1  . levocetirizine (XYZAL) 5 MG tablet TAKE 1 TABLET BY MOUTH  EVERY EVENING 90  tablet 2  . losartan (COZAAR) 100 MG tablet Take 1 tablet (100  mg total) by mouth daily. 90 tablet 1  . MAGNESIUM PO Take 1 tablet by mouth daily.    . naproxen (NAPROSYN) 500 MG tablet TAKE 1 TABLET BY MOUTH TWO  TIMES DAILY WITH MEALS 180 tablet 1  . omeprazole (PRILOSEC) 20 MG capsule TAKE 1 CAPSULE BY MOUTH  DAILY 90 capsule 3  . ondansetron (ZOFRAN) 4 MG tablet Take 1 tablet (4 mg total) by mouth every 8 (eight) hours as needed for nausea or vomiting. 20 tablet 0  . pantoprazole (PROTONIX) 40 MG tablet Take 1 tablet (40 mg total) by mouth daily. 30 tablet 1  . predniSONE (DELTASONE) 20 MG tablet Take 2 tablets (40 mg total) by mouth daily with breakfast for 14 days, THEN 1 tablet (20 mg total) daily with breakfast for 14 days. 42 tablet 0  . psyllium (HYDROCIL/METAMUCIL) 95 % PACK Take 1 packet by mouth 2 (two) times daily. 60 each 1  . rOPINIRole (REQUIP) 0.25 MG tablet Take 1 tablet (0.25 mg total) by mouth at bedtime. 90 tablet 0  . Spacer/Aero-Holding Chambers (AEROCHAMBER MINI CHAMBER) DEVI Use with inhaler 1 each 0  . tadalafil (CIALIS) 20 MG tablet Take 1 tablet (20 mg total) by mouth daily as needed for erectile dysfunction. 30 tablet 2  . tamsulosin (FLOMAX) 0.4 MG CAPS capsule Take 1 capsule (0.4 mg total) by mouth daily. 90 capsule 0  . testosterone cypionate (DEPOTESTOTERONE CYPIONATE) 100 MG/ML injection INJECT 100 MG (1 ML) AS DIRECTED EVERY 14 DAYS    . Testosterone Cypionate 100 MG/ML SOLN Inject 100 mg as directed every 14 (fourteen) days. 1 vial 1  . fluticasone furoate-vilanterol (BREO ELLIPTA) 100-25 MCG/INH AEPB Inhale 1 puff into the lungs daily. (Patient not taking: Reported on 03/04/2020) 30 each 3  . potassium chloride SA (K-DUR) 20 MEQ tablet Take 1 tablet (20 mEq total) by mouth 2 (two) times daily for 3 days. 6 tablet 0  . benzonatate (TESSALON) 200 MG capsule Take 1 capsule (200 mg total) by mouth 2 (two) times daily as needed for cough. 20 capsule 0  . cetirizine  (ZYRTEC) 10 MG tablet Take 1 tablet (10 mg total) by mouth daily. 30 tablet 11  . fluticasone furoate-vilanterol (BREO ELLIPTA) 100-25 MCG/INH AEPB Inhale 1 puff into the lungs daily. 1 each 0   Facility-Administered Medications Prior to Visit  Medication Dose Route Frequency Provider Last Rate Last Admin  . albuterol (VENTOLIN HFA) 108 (90 Base) MCG/ACT inhaler 2 puff  2 puff Inhalation Q4H PRN Scot Jun, FNP      . testosterone cypionate (DEPOTESTOSTERONE CYPIONATE) injection 100 mg  100 mg Intramuscular Q14 Days Isaac Bliss, Rayford Halsted, MD   100 mg at 11/02/19 1024  . testosterone cypionate (DEPOTESTOTERONE CYPIONATE) injection 100 mg  100 mg Intramuscular Q14 Days Isaac Bliss, Rayford Halsted, MD   100 mg at 05/14/19 [id number]  . testosterone cypionate (DEPOTESTOTERONE CYPIONATE) injection 100 mg  100 mg Intramuscular Q14 Days Isaac Bliss, Rayford Halsted, MD   100 mg at 07/16/19 1051

## 2020-03-06 ENCOUNTER — Other Ambulatory Visit: Payer: Self-pay

## 2020-03-06 ENCOUNTER — Ambulatory Visit (INDEPENDENT_AMBULATORY_CARE_PROVIDER_SITE_OTHER): Payer: Medicare Other | Admitting: Internal Medicine

## 2020-03-06 ENCOUNTER — Encounter: Payer: Self-pay | Admitting: Internal Medicine

## 2020-03-06 VITALS — BP 140/80 | HR 75 | Temp 97.6°F | Wt 177.5 lb

## 2020-03-06 DIAGNOSIS — Z8616 Personal history of covid-19: Secondary | ICD-10-CM

## 2020-03-06 DIAGNOSIS — R5383 Other fatigue: Secondary | ICD-10-CM

## 2020-03-06 DIAGNOSIS — N529 Male erectile dysfunction, unspecified: Secondary | ICD-10-CM

## 2020-03-06 DIAGNOSIS — E349 Endocrine disorder, unspecified: Secondary | ICD-10-CM | POA: Diagnosis not present

## 2020-03-06 DIAGNOSIS — R0600 Dyspnea, unspecified: Secondary | ICD-10-CM | POA: Diagnosis not present

## 2020-03-06 DIAGNOSIS — U071 COVID-19: Secondary | ICD-10-CM

## 2020-03-06 LAB — CBC WITH DIFFERENTIAL/PLATELET
Basophils Absolute: 0 10*3/uL (ref 0.0–0.1)
Basophils Relative: 0.3 % (ref 0.0–3.0)
Eosinophils Absolute: 0.1 10*3/uL (ref 0.0–0.7)
Eosinophils Relative: 0.6 % (ref 0.0–5.0)
HCT: 40.1 % (ref 39.0–52.0)
Hemoglobin: 13.9 g/dL (ref 13.0–17.0)
Lymphocytes Relative: 21.2 % (ref 12.0–46.0)
Lymphs Abs: 2.1 10*3/uL (ref 0.7–4.0)
MCHC: 34.6 g/dL (ref 30.0–36.0)
MCV: 90.2 fl (ref 78.0–100.0)
Monocytes Absolute: 0.8 10*3/uL (ref 0.1–1.0)
Monocytes Relative: 8.1 % (ref 3.0–12.0)
Neutro Abs: 7 10*3/uL (ref 1.4–7.7)
Neutrophils Relative %: 69.8 % (ref 43.0–77.0)
Platelets: 267 10*3/uL (ref 150.0–400.0)
RBC: 4.45 Mil/uL (ref 4.22–5.81)
RDW: 13.6 % (ref 11.5–15.5)
WBC: 10.1 10*3/uL (ref 4.0–10.5)

## 2020-03-06 LAB — COMPREHENSIVE METABOLIC PANEL
ALT: 20 U/L (ref 0–53)
AST: 17 U/L (ref 0–37)
Albumin: 4.5 g/dL (ref 3.5–5.2)
Alkaline Phosphatase: 61 U/L (ref 39–117)
BUN: 23 mg/dL (ref 6–23)
CO2: 25 mEq/L (ref 19–32)
Calcium: 9.2 mg/dL (ref 8.4–10.5)
Chloride: 102 mEq/L (ref 96–112)
Creatinine, Ser: 0.99 mg/dL (ref 0.40–1.50)
GFR: 73.03 mL/min (ref >60.00)
Glucose, Bld: 94 mg/dL (ref 70–99)
Potassium: 3.9 mEq/L (ref 3.5–5.1)
Sodium: 137 mEq/L (ref 135–145)
Total Bilirubin: 0.5 mg/dL (ref 0.2–1.2)
Total Protein: 6.6 g/dL (ref 6.0–8.3)

## 2020-03-06 LAB — TESTOSTERONE: Testosterone: 187.11 ng/dL — ABNORMAL LOW (ref 300.00–890.00)

## 2020-03-06 MED ORDER — TESTOSTERONE CYPIONATE 100 MG/ML IM SOLN
100.0000 mg | INTRAMUSCULAR | Status: DC
  Administered 2020-03-06 – 2020-04-03 (×2): 100 mg via INTRAMUSCULAR

## 2020-03-06 MED ORDER — TADALAFIL 20 MG PO TABS: 20.0000 mg | ORAL_TABLET | Freq: Every day | ORAL | 2 refills | Status: DC | PRN

## 2020-03-06 NOTE — Progress Notes (Signed)
Established Patient Office Visit     This visit occurred during the SARS-CoV-2 public health emergency.  Safety protocols were in place, including screening questions prior to the visit, additional usage of staff PPE, and extensive cleaning of exam room while observing appropriate contact time as indicated for disinfecting solutions.    CC/Reason for Visit: Discuss fatigue  HPI: David Goodman is a 79 y.o. male who is coming in today for the above mentioned reasons.  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  He has had a hard time with fatigue and dyspnea since then.  He just started seeing pulmonology.  They have started him on a prednisone taper and plan to see him back in 6 weeks.  He wants to talk to me today about his fatigue.  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  He was to check his testosterone levels today.  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  He states his erectile dysfunction has been worse.  He would like a prescription for Cialis.   Past Medical/Surgical History: Past Medical History:  Diagnosis Date  . ALLERGIC RHINITIS 07/13/2007  . HYPERLIPIDEMIA 07/13/2007  . HYPERTENSION 07/13/2007  . 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 PEPTIC ULCER DISEASE 07/13/2007  . 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 COLONOSCOPY WITH PROPOFOL N/A 02/17/2019   Procedure: COLONOSCOPY WITH PROPOFOL;  Surgeon: Ronnette Juniper, MD;  Location: [facility];  Service: Gastroenterology;  Laterality: N/A;  . IR ANGIOGRAM VISCERAL SELECTIVE  02/16/2019  . IR ANGIOGRAM VISCERAL SELECTIVE  02/16/2019  . IR ANGIOGRAM VISCERAL SELECTIVE  02/16/2019  . IR US GUIDE VASC ACCESS RIGHT  02/16/2019  . ROTATOR CUFF REPAIR      Social History:  reports that he has never smoked. He has never used smokeless tobacco. He reports current alcohol use. He reports that he does not use  drugs.  Allergies: Allergies  Allergen Reactions  . Lisinopril Cough    Cough  . Tramadol Hcl     REACTION: unspecified    Family History:  No history of heart disease, cancer, stroke that he is aware of.  Current Outpatient Medications:  .  Ascorbic Acid (VITAMIN C PO), Take 1 tablet by mouth daily., Disp: , Rfl:  .  clonazePAM (KLONOPIN) 0.5 MG tablet, Take 0.5 tablets (0.25 mg total) by mouth at bedtime as needed for anxiety., Disp: 20 tablet, Rfl: 1 .  diphenhydrAMINE (BENADRYL) 25 MG tablet, Take 25 mg by mouth daily as needed for allergies., Disp: , Rfl:  .  fluticasone (FLONASE) 50 MCG/ACT nasal spray, Place 2 sprays into both nostrils daily., Disp: 16 g, Rfl: 6 .  fluticasone furoate-vilanterol (BREO ELLIPTA) 100-25 MCG/INH AEPB, Inhale 1 puff into the lungs daily., Disp: 30 each, Rfl: 3 .  HYDROcodone-homatropine (HYCODAN) 5-1.5 MG/5ML syrup, Take 5 mLs by mouth every 8 (eight) hours as needed for cough., Disp: 100 mL, Rfl: 0 .  latanoprost (XALATAN) 0.005 % ophthalmic solution, Place 1 drop into both eyes at bedtime., Disp: 7.5 mL, Rfl: 1 .  levocetirizine (XYZAL) 5 MG tablet, TAKE 1 TABLET BY MOUTH  EVERY EVENING, Disp: 90 tablet, Rfl: 2 .  losartan (COZAAR) 100 MG tablet, Take 1 tablet (100 mg total) by mouth daily., Disp: 90 tablet, Rfl: 1 .  MAGNESIUM PO, Take 1 tablet by mouth daily., Disp: , Rfl:  .  naproxen (NAPROSYN) 500  MG tablet, TAKE 1 TABLET BY MOUTH TWO  TIMES DAILY WITH MEALS, Disp: 180 tablet, Rfl: 1 .  omeprazole (PRILOSEC) 20 MG capsule, TAKE 1 CAPSULE BY MOUTH  DAILY, Disp: 90 capsule, Rfl: 3 .  ondansetron (ZOFRAN) 4 MG tablet, Take 1 tablet (4 mg total) by mouth every 8 (eight) hours as needed for nausea or vomiting., Disp: 20 tablet, Rfl: 0 .  pantoprazole (PROTONIX) 40 MG tablet, Take 1 tablet (40 mg total) by mouth daily., Disp: 30 tablet, Rfl: 1 .  psyllium (HYDROCIL/METAMUCIL) 95 % PACK, Take 1 packet by mouth 2 (two) times daily., Disp: 60 each, Rfl:  1 .  rOPINIRole (REQUIP) 0.25 MG tablet, Take 1 tablet (0.25 mg total) by mouth at bedtime., Disp: 90 tablet, Rfl: 0 .  Spacer/Aero-Holding Chambers (AEROCHAMBER MINI CHAMBER) DEVI, Use with inhaler, Disp: 1 each, Rfl: 0 .  tadalafil (CIALIS) 20 MG tablet, Take 1 tablet (20 mg total) by mouth daily as needed for erectile dysfunction., Disp: 30 tablet, Rfl: 2 .  tamsulosin (FLOMAX) 0.4 MG CAPS capsule, Take 1 capsule (0.4 mg total) by mouth daily., Disp: 90 capsule, Rfl: 0 .  testosterone cypionate (DEPOTESTOTERONE CYPIONATE) 100 MG/ML injection, INJECT 100 MG (1 ML) AS DIRECTED EVERY 14 DAYS, Disp: , Rfl:  .  Testosterone Cypionate 100 MG/ML SOLN, Inject 100 mg as directed every 14 (fourteen) days., Disp: 1 vial, Rfl: 1 .  potassium chloride SA (K-DUR) 20 MEQ tablet, Take 1 tablet (20 mEq total) by mouth 2 (two) times daily for 3 days., Disp: 6 tablet, Rfl: 0  Current Facility-Administered Medications:  .  albuterol (VENTOLIN HFA) 108 (90 Base) MCG/ACT inhaler 2 puff, 2 puff, Inhalation, Q4H PRN, Scot Jun, FNP .  testosterone cypionate (DEPOTESTOSTERONE CYPIONATE) injection 100 mg, 100 mg, Intramuscular, Q14 Days, Isaac Bliss, Rayford Halsted, MD, 100 mg at 11/02/19 1024 .  testosterone cypionate (DEPOTESTOTERONE CYPIONATE) injection 100 mg, 100 mg, Intramuscular, Q14 Days, Isaac Bliss, Rayford Halsted, MD, 100 mg at 05/14/19 0952 .  testosterone cypionate (DEPOTESTOTERONE CYPIONATE) injection 100 mg, 100 mg, Intramuscular, Q14 Days, Isaac Bliss, Rayford Halsted, MD, 100 mg at 07/16/19 1051  Review of Systems:  Constitutional: Denies fever, chills, diaphoresis, appetite change. HEENT: Denies photophobia, eye pain, redness, hearing loss, ear pain, congestion, sore throat, rhinorrhea, sneezing, mouth sores, trouble swallowing, neck pain, neck stiffness. Respiratory: Denies chest tightness,  and wheezing.   Cardiovascular: Denies chest pain, palpitations and leg swelling.  Gastrointestinal:  Denies nausea, vomiting, abdominal pain, diarrhea, constipation, blood in stool and abdominal distention.  Genitourinary: Denies dysuria, urgency, frequency, hematuria, flank pain and difficulty urinating.  Endocrine: Denies: hot or cold intolerance, sweats, changes in hair or nails, polyuria, polydipsia. Musculoskeletal: Denies myalgias, back pain, joint swelling, arthralgias and gait problem.  Skin: Denies pallor, rash and wound.  Neurological: Denies dizziness, seizures, syncope, weakness, light-headedness, numbness and headaches.  Hematological: Denies adenopathy. Easy bruising, personal or family bleeding history  Psychiatric/Behavioral: Denies suicidal ideation, mood changes, confusion, nervousness, sleep disturbance and agitation    Physical Exam: Vitals:   03/06/20 0723  BP: 140/80  Pulse: 75  Temp: 97.6 F (36.4 C)  TempSrc: Temporal  SpO2: 98%  Weight: 177 lb 8 oz (80.5 kg)    Body mass index is 29.54 kg/m.   Constitutional: NAD, calm, comfortable Eyes: PERRL, lids and conjunctivae normal ENMT: Mucous membranes are moist.  Tympanic membrane is pearly white, no erythema or bulging. Neck: normal, supple, no masses, no thyromegaly Respiratory: clear to auscultation bilaterally, no  wheezing, no crackles. Normal respiratory effort. No accessory muscle use.  Cardiovascular: Regular rate and rhythm, no murmurs / rubs / gallops. No extremity edema.  Neurologic: Grossly intact and nonfocal Psychiatric: Normal judgment and insight. Alert and oriented x 3. Normal mood.    Impression and Plan:  Fatigue, unspecified type  -As discussed with the patient, I believe this is related to his post Covid fibrosis and inflammation. -He has been started on steroids. -Nonetheless would like to recheck his labs.  Will order CBC, CMP, testosterone levels per request.  Dyspnea due to COVID-19 -Currently being followed by pulmonary, on a prednisone taper.  Testosterone deficiency  -  Plan: Testosterone  Erectile dysfunction of organic origin  - Plan: tadalafil (CIALIS) 20 MG tablet   Patient Instructions  -Nice seeing you today!!  -Lab work today; will notify you once results are available.  -Schedule follow up in 3 months.       Lelon Frohlich, MD [facility]

## 2020-03-06 NOTE — Patient Instructions (Addendum)
-  Nice seeing you today!!  -Lab work today; will notify you once results are available.  -Schedule follow up in 3 months. 

## 2020-03-19 ENCOUNTER — Other Ambulatory Visit: Payer: Self-pay

## 2020-03-20 ENCOUNTER — Ambulatory Visit (INDEPENDENT_AMBULATORY_CARE_PROVIDER_SITE_OTHER): Payer: Medicare Other | Admitting: *Deleted

## 2020-03-20 ENCOUNTER — Telehealth: Payer: Self-pay | Admitting: *Deleted

## 2020-03-20 DIAGNOSIS — E349 Endocrine disorder, unspecified: Secondary | ICD-10-CM | POA: Diagnosis not present

## 2020-03-20 MED ORDER — TESTOSTERONE CYPIONATE 100 MG/ML IM SOLN
100.0000 mg | Freq: Once | INTRAMUSCULAR | Status: AC
  Administered 2020-03-20: 100 mg via INTRAMUSCULAR

## 2020-03-20 NOTE — Telephone Encounter (Signed)
Patient is getting Testosterone Cypionate 100 MG/ML SOLN injections every 14 days.  Patient would like to know if his dosage should be increased because his testosterone was lower on his last labs?

## 2020-03-20 NOTE — Telephone Encounter (Signed)
Spoke with patient and he will leave his dosage the same.

## 2020-03-20 NOTE — Telephone Encounter (Signed)
Can increase to 200 but not sure it will make a difference in symptoms..If he wishes, ok to order.

## 2020-03-20 NOTE — Progress Notes (Signed)
Per orders of Dr. Hernandez, injection of Testosterone  given by Klover Priestly. Patient tolerated injection well. 

## 2020-04-02 ENCOUNTER — Other Ambulatory Visit: Payer: Self-pay

## 2020-04-03 ENCOUNTER — Ambulatory Visit (INDEPENDENT_AMBULATORY_CARE_PROVIDER_SITE_OTHER): Payer: Medicare Other

## 2020-04-03 ENCOUNTER — Telehealth: Payer: Self-pay

## 2020-04-03 DIAGNOSIS — E349 Endocrine disorder, unspecified: Secondary | ICD-10-CM | POA: Diagnosis not present

## 2020-04-03 DIAGNOSIS — R3912 Poor urinary stream: Secondary | ICD-10-CM

## 2020-04-03 DIAGNOSIS — R351 Nocturia: Secondary | ICD-10-CM

## 2020-04-03 DIAGNOSIS — R7989 Other specified abnormal findings of blood chemistry: Secondary | ICD-10-CM

## 2020-04-03 DIAGNOSIS — R300 Dysuria: Secondary | ICD-10-CM

## 2020-04-03 NOTE — Telephone Encounter (Signed)
Do not believe his sneezing is related to flomax. Maybe allergies given the season? In any case, there is no sub for flomax. He may stop taking if he prefers.

## 2020-04-03 NOTE — Patient Instructions (Signed)
There are no preventive care reminders to display for this patient.  Depression screen [facility] 2/9 03/06/2020 11/15/2018 10/31/2018  Decreased Interest 0 0 0  Down, Depressed, Hopeless 0 0 0  PHQ - 2 Score 0 0 0  Altered sleeping 0 - -  Tired, decreased energy 3 - -  Change in appetite 0 - -  Feeling bad or failure about yourself  0 - -  Trouble concentrating 2 - -  Moving slowly or fidgety/restless 0 - -  Suicidal thoughts 0 - -  PHQ-9 Score 5 - -  Difficult doing work/chores Not difficult at all - -

## 2020-04-03 NOTE — Telephone Encounter (Signed)
Pt came in for NV today and stated that he needed a sub for Tamsulosin. Pt claimed that he started Rx 3 months ago w/o relief. Pt articulated that in the past he has had an allergy to the Rx but wanted to try again.  Pt stated that he sneezes still anytime he is on this medication so he stopped it.

## 2020-04-03 NOTE — Telephone Encounter (Signed)
Spoke with patient and a referral to urology placed

## 2020-04-03 NOTE — Progress Notes (Signed)
Per orders of Dr. Jerilee Hoh, injection of testosterone given by Franco Collet. Patient tolerated injection well.   Pt wanted to know how long he has to take testosterone?

## 2020-04-08 ENCOUNTER — Other Ambulatory Visit: Payer: Self-pay

## 2020-04-08 ENCOUNTER — Ambulatory Visit: Payer: Medicare Other | Admitting: Pulmonary Disease

## 2020-04-08 ENCOUNTER — Encounter: Payer: Self-pay | Admitting: Pulmonary Disease

## 2020-04-08 DIAGNOSIS — J849 Interstitial pulmonary disease, unspecified: Secondary | ICD-10-CM | POA: Diagnosis not present

## 2020-04-08 NOTE — Patient Instructions (Signed)
Mr.Koziel I am glad you are doing much. I will continue to monitor you to make sure you do not have worsening of your lungs after the Covid-19 pneumonia. I have ordered pulmonary function test and a six minute walk test. Please follow up with me in 3 months. In 6 months we will get another CT Scan of your lungs.    - 6 minute walk test - PFT's - Follow up in 3 months - Hight resolution CT in 6 months

## 2020-04-08 NOTE — Progress Notes (Signed)
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 Patient reports he was sick for a month and a half. After his COVID pneumonia infection he had DOE and myalgias.  He was seen by Dr.Smith and treated with prednisone with a long taper in April and reports his symptoms are improved. Denies any cough, DOE, or myalgias today.   Pets: no pets  Occupation: run restaurants and owns storage buildings  Exposures:  Smoking history: never smoker  Travel history: no travel  Relevant family history:   Outpatient Encounter Medications as of 04/08/2020  Medication Sig  . Ascorbic Acid (VITAMIN C PO) Take 1 tablet by mouth daily.  . clonazePAM (KLONOPIN) 0.5 MG tablet Take 0.5 tablets (0.25 mg total) by mouth at bedtime as needed for anxiety.  . diphenhydrAMINE (BENADRYL) 25 MG tablet Take 25 mg by mouth daily as needed for allergies.  . fluticasone (FLONASE) 50 MCG/ACT nasal spray Place 2 sprays into both nostrils daily.  . fluticasone furoate-vilanterol (BREO ELLIPTA) 100-25 MCG/INH AEPB Inhale 1 puff into the lungs daily.  Marland Kitchen HYDROcodone-homatropine (HYCODAN) 5-1.5 MG/5ML syrup Take 5 mLs by mouth every 8 (eight) hours as needed for cough.  . latanoprost (XALATAN) 0.005 % ophthalmic solution Place 1 drop into both eyes at bedtime.  Marland Kitchen levocetirizine (XYZAL) 5 MG tablet TAKE 1 TABLET BY MOUTH  EVERY EVENING  . losartan (COZAAR) 100 MG tablet Take 1 tablet (100 mg total) by mouth daily.  Marland Kitchen MAGNESIUM PO Take 1 tablet by mouth daily.  . naproxen (NAPROSYN) 500 MG tablet TAKE 1 TABLET BY MOUTH TWO  TIMES DAILY WITH MEALS  .  omeprazole (PRILOSEC) 20 MG capsule TAKE 1 CAPSULE BY MOUTH  DAILY  . ondansetron (ZOFRAN) 4 MG tablet Take 1 tablet (4 mg total) by mouth every 8 (eight) hours as needed for nausea or vomiting.  . pantoprazole (PROTONIX) 40 MG tablet Take 1 tablet (40 mg total) by mouth daily.  . potassium chloride SA (K-DUR) 20 MEQ tablet Take 1 tablet (20 mEq total) by mouth 2 (two) times daily for 3 days.  Marland Kitchen psyllium (HYDROCIL/METAMUCIL) 95 % PACK Take 1 packet by mouth 2 (two) times daily.  Marland Kitchen rOPINIRole (REQUIP) 0.25 MG tablet Take 1 tablet (0.25 mg total) by mouth at bedtime.  Marland Kitchen Spacer/Aero-Holding Chambers (AEROCHAMBER MINI CHAMBER) DEVI Use with inhaler  . tadalafil (CIALIS) 20 MG tablet Take 1 tablet (20 mg total) by mouth daily as needed for erectile dysfunction.  . tamsulosin (FLOMAX) 0.4 MG CAPS capsule Take 1 capsule (0.4 mg total) by mouth daily.  Marland Kitchen testosterone cypionate (DEPOTESTOTERONE CYPIONATE) 100 MG/ML injection INJECT 100 MG (1 ML) AS DIRECTED EVERY 14 DAYS  . Testosterone Cypionate 100 MG/ML SOLN Inject 100 mg as directed every 14 (fourteen) days.   Facility-Administered Encounter Medications as of 04/08/2020  Medication  . albuterol (VENTOLIN HFA) 108 (90 Base) MCG/ACT inhaler 2 puff  . testosterone cypionate (DEPOTESTOSTERONE CYPIONATE) injection 100 mg  . testosterone cypionate (DEPOTESTOTERONE CYPIONATE) injection 100 mg  . testosterone cypionate (DEPOTESTOTERONE CYPIONATE) injection 100 mg  . testosterone cypionate (DEPOTESTOTERONE CYPIONATE)  injection 100 mg    Allergies as of 04/08/2020 - Review Complete 03/06/2020  Allergen Reaction Noted  . Lisinopril Cough 06/06/2015  . Tramadol hcl  02/23/2007    Past Medical History:  Diagnosis Date  . ALLERGIC RHINITIS 07/13/2007  . HYPERLIPIDEMIA 07/13/2007  . HYPERTENSION 07/13/2007  . 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 PEPTIC ULCER DISEASE 07/13/2007  . 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 COLONOSCOPY WITH PROPOFOL N/A 02/17/2019   Procedure: COLONOSCOPY WITH PROPOFOL;  Surgeon: Ronnette Juniper, MD;  Location: [facility];  Service: Gastroenterology;  Laterality: N/A;  . IR ANGIOGRAM VISCERAL SELECTIVE  02/16/2019  . IR ANGIOGRAM VISCERAL SELECTIVE  02/16/2019  . IR ANGIOGRAM VISCERAL SELECTIVE  02/16/2019  . IR US GUIDE VASC ACCESS RIGHT  02/16/2019  . ROTATOR CUFF REPAIR      No family history on file.  Social History   Socioeconomic History  . Marital status: Married    Spouse name: Not on file  . Number of children: Not on file  . Years of education: Not on file  . Highest education level: Not on file  Occupational History  . Not on file  Tobacco Use  . Smoking status: Never Smoker  . Smokeless tobacco: Never Used  Substance and Sexual Activity  . Alcohol use: Yes    Comment: couple times a week - wine  . Drug use: No  . Sexual activity: Not Currently  Other Topics Concern  . Not on file  Social History Narrative  . Not on file   Social Determinants of Health   Financial Resource Strain:   . Difficulty of Paying Living Expenses:   Food Insecurity:   . Worried About Charity fundraiser in the Last Year:   . Arboriculturist in the Last Year:   Transportation Needs:   . Film/video editor (Medical):   Marland Kitchen Lack of Transportation (Non-Medical):   Physical Activity:   . Days of Exercise per Week:   . Minutes of Exercise per Session:   Stress:   . Feeling of Stress :   Social Connections:   . Frequency of Communication with Friends and Family:   . Frequency of Social Gatherings with Friends and Family:   . Attends Religious Services:   . Active Member of Clubs or Organizations:   . Attends Archivist Meetings:   Marland Kitchen Marital Status:   Intimate Partner Violence:   . Fear of Current or Ex-Partner:   . Emotionally Abused:   Marland Kitchen Physically Abused:   . Sexually Abused:     Review of systems: Review of Systems   Constitutional: Negative for fever and chills.  HENT: Negative.   Eyes: Negative for blurred vision.  Respiratory: as per HPI  Cardiovascular: Negative for chest pain and palpitations.  Gastrointestinal: Negative for vomiting, diarrhea, blood per rectum. Genitourinary: Negative for dysuria, urgency, frequency and hematuria.  Musculoskeletal: Negative for myalgias, back pain and joint pain.  Skin: Negative for itching and rash.  Neurological: Negative for dizziness, tremors, focal weakness, seizures and loss of consciousness.  Endo/Heme/Allergies: Negative for environmental allergies.  Psychiatric/Behavioral: Negative for depression, suicidal ideas and hallucinations.  All other systems reviewed and are negative.  Physical Exam: There were no vitals taken for this visit. General: NAD, nl appearance HE: Normocephalic, atraumatic , EOMI, Conjunctivae normal ENT: No congestion, no rhinorrhea, no exudate or erythema  Cardiovascular: Normal rate, regular rhythm.  No murmurs,  rubs, or gallops Pulmonary : Effort normal, breath sounds normal. No wheezes, rales, or rhonchi Abdominal: soft, nontender,  bowel sounds present Musculoskeletal: no swelling , deformity, injury ,or tenderness in extremities, Skin: Warm, dry , no bruising, erythema, or rash Psychiatric/Behavioral:  normal mood, normal behavior    Data Reviewed: Imaging:  CT Chest WO Contrast: Predominately GGO with some traction bronchiectasis a baisalar segments. NSIP pattern   PFTs: n/a  Labs:   CBC 03/06/2020: WBC 10.1, neutrophils 69.8% CMP 03/06/2020: WNL  Assessment:  David Goodman is a 79 year old male without significant history of lung disease before COVID-19 Pneumonia in December of 2020. 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 He is asymptomatic on exam today. We will order a 6 minute walk test and PFT's to better evaluate his lung function post infection. I will see him in office in 3 months for follow up.   Plan for high resolution CT Chest in 6 months to monitor progression of ILD.   Plan/Recommendations: - 6 minute walk test - PFT's - Follow up in 3 months - Hight resolution CT in 6 months   Tamsen Snider, MD PGY1   Attending note: I have seen and examined the patient. History, labs and imaging reviewed. Agree with assessment and plan.  Marshell Garfinkel MD [facility] 04/09/2020, 8:29 AM CC: Isaac Bliss, [place]*

## 2020-04-10 DIAGNOSIS — H401122 Primary open-angle glaucoma, left eye, moderate stage: Secondary | ICD-10-CM | POA: Diagnosis not present

## 2020-04-10 DIAGNOSIS — H401111 Primary open-angle glaucoma, right eye, mild stage: Secondary | ICD-10-CM | POA: Diagnosis not present

## 2020-04-21 ENCOUNTER — Other Ambulatory Visit (HOSPITAL_COMMUNITY): Payer: Medicare Other

## 2020-05-01 ENCOUNTER — Other Ambulatory Visit: Payer: Self-pay | Admitting: Internal Medicine

## 2020-05-27 ENCOUNTER — Other Ambulatory Visit (HOSPITAL_COMMUNITY)
Admission: RE | Admit: 2020-05-27 | Discharge: 2020-05-27 | Disposition: A | Discharge status: Home / Self Care | Payer: Medicare Other | Source: Ambulatory Visit | Attending: Internal Medicine | Admitting: Internal Medicine

## 2020-05-27 DIAGNOSIS — Z01812 Encounter for preprocedural laboratory examination: Secondary | ICD-10-CM | POA: Insufficient documentation

## 2020-05-27 DIAGNOSIS — Z20822 Contact with and (suspected) exposure to covid-19: Secondary | ICD-10-CM | POA: Diagnosis not present

## 2020-05-27 LAB — SARS CORONAVIRUS 2 (TAT 6-24 HRS): SARS Coronavirus 2: NEGATIVE

## 2020-05-30 ENCOUNTER — Other Ambulatory Visit: Payer: Self-pay

## 2020-05-30 ENCOUNTER — Ambulatory Visit (INDEPENDENT_AMBULATORY_CARE_PROVIDER_SITE_OTHER): Payer: Medicare Other | Admitting: Pulmonary Disease

## 2020-05-30 DIAGNOSIS — J849 Interstitial pulmonary disease, unspecified: Secondary | ICD-10-CM | POA: Diagnosis not present

## 2020-05-30 LAB — PULMONARY FUNCTION TEST
DL/VA % pred: 106 %
DL/VA: 4.25 ml/min/mmHg/L
DLCO unc % pred: 103 %
DLCO unc: 21.74 ml/min/mmHg
RV % pred: 285 %
RV: 6.68 L
TLC % pred: 168 %
TLC: 10.16 L

## 2020-05-30 NOTE — Progress Notes (Signed)
PFT completed today.  

## 2020-06-05 ENCOUNTER — Ambulatory Visit (INDEPENDENT_AMBULATORY_CARE_PROVIDER_SITE_OTHER): Payer: Medicare Other | Admitting: Internal Medicine

## 2020-06-05 ENCOUNTER — Encounter: Payer: Self-pay | Admitting: Internal Medicine

## 2020-06-05 ENCOUNTER — Other Ambulatory Visit: Payer: Self-pay

## 2020-06-05 VITALS — BP 110/70 | HR 71 | Temp 97.0°F | Wt 181.5 lb

## 2020-06-05 DIAGNOSIS — N529 Male erectile dysfunction, unspecified: Secondary | ICD-10-CM

## 2020-06-05 DIAGNOSIS — N400 Enlarged prostate without lower urinary tract symptoms: Secondary | ICD-10-CM

## 2020-06-05 DIAGNOSIS — H9202 Otalgia, left ear: Secondary | ICD-10-CM | POA: Diagnosis not present

## 2020-06-05 DIAGNOSIS — R252 Cramp and spasm: Secondary | ICD-10-CM

## 2020-06-05 DIAGNOSIS — R42 Dizziness and giddiness: Secondary | ICD-10-CM

## 2020-06-05 DIAGNOSIS — E785 Hyperlipidemia, unspecified: Secondary | ICD-10-CM

## 2020-06-05 DIAGNOSIS — I10 Essential (primary) hypertension: Secondary | ICD-10-CM

## 2020-06-05 NOTE — Patient Instructions (Signed)
-  Nice seeing you today!!  -Referrals to ENT and urology requested today.  -You will be called to schedule a leg [place] to evaluate your leg cramps.  -Schedule follow up in 4 months for you physical. Please come in fasting that day.

## 2020-06-05 NOTE — Progress Notes (Signed)
Established Patient Office Visit     This visit occurred during the SARS-CoV-2 public health emergency.  Safety protocols were in place, including screening questions prior to the visit, additional usage of staff PPE, and extensive cleaning of exam room while observing appropriate contact time as indicated for disinfecting solutions.    CC/Reason for Visit: Discuss some acute and chronic concerns  HPI: David Goodman is a 79 y.o. male who is coming in today for the above mentioned reasons. 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  He was hospitalized in early March 2020 for GI bleed.  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  He has been following with pulmonary for this.  Fatigue is still present although a little improved.  He also continues to complain of left ear pain and dizziness that has been ongoing for some years.  He also is still complaining of bilateral leg cramps that are worse at nighttime, this has been worked up in the past.   Past Medical/Surgical History: Past Medical History:  Diagnosis Date  . ALLERGIC RHINITIS 07/13/2007  . HYPERLIPIDEMIA 07/13/2007  . HYPERTENSION 07/13/2007  . 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 PEPTIC ULCER DISEASE 07/13/2007  . 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 COLONOSCOPY WITH PROPOFOL N/A 02/17/2019   Procedure: COLONOSCOPY WITH PROPOFOL;  Surgeon: Ronnette Juniper, MD;  Location: [facility];  Service: Gastroenterology;  Laterality: N/A;  . IR ANGIOGRAM VISCERAL SELECTIVE  02/16/2019  . IR ANGIOGRAM VISCERAL SELECTIVE  02/16/2019  . IR ANGIOGRAM VISCERAL SELECTIVE  02/16/2019  . IR US GUIDE VASC ACCESS RIGHT  02/16/2019  . ROTATOR CUFF REPAIR      Social History:  reports that he has  never smoked. He has never used smokeless tobacco. He reports current alcohol use. He reports that he does not use drugs.  Allergies: Allergies  Allergen Reactions  . Lisinopril Cough    Cough  . Tramadol Hcl     REACTION: unspecified    Family History:  No history of heart disease, cancer, stroke that he is aware of.  Current Outpatient Medications:  .  Ascorbic Acid (VITAMIN C PO), Take 1 tablet by mouth daily., Disp: , Rfl:  .  clonazePAM (KLONOPIN) 0.5 MG tablet, Take 0.5 tablets (0.25 mg total) by mouth at bedtime as needed for anxiety., Disp: 20 tablet, Rfl: 1 .  diphenhydrAMINE (BENADRYL) 25 MG tablet, Take 25 mg by mouth daily as needed for allergies., Disp: , Rfl:  .  fluticasone (FLONASE) 50 MCG/ACT nasal spray, Place 2 sprays into both nostrils daily., Disp: 16 g, Rfl: 6 .  fluticasone furoate-vilanterol (BREO ELLIPTA) 100-25 MCG/INH AEPB, Inhale 1 puff into the lungs daily., Disp: 30 each, Rfl: 3 .  HYDROcodone-homatropine (HYCODAN) 5-1.5 MG/5ML syrup, Take 5 mLs by mouth every 8 (eight) hours as needed for cough., Disp: 100 mL, Rfl: 0 .  latanoprost (XALATAN) 0.005 % ophthalmic solution, Place 1 drop into both eyes at bedtime., Disp: 7.5 mL, Rfl: 1 .  levocetirizine (XYZAL) 5 MG tablet, TAKE 1 TABLET BY MOUTH  EVERY EVENING, Disp: 90 tablet, Rfl: 2 .  losartan (COZAAR) 100 MG tablet, Take 1 tablet (100 mg total) by mouth daily., Disp: 90 tablet, Rfl: 1 .  MAGNESIUM PO, Take 1 tablet by mouth daily., Disp: , Rfl:  .  naproxen (NAPROSYN) 500 MG tablet, TAKE 1 TABLET BY MOUTH TWO  TIMES DAILY WITH MEALS, Disp: 180 tablet, Rfl: 1 .  omeprazole (PRILOSEC) 20 MG capsule, TAKE 1 CAPSULE BY MOUTH  DAILY, Disp: 90 capsule, Rfl: 3 .  ondansetron (ZOFRAN) 4 MG tablet, Take 1 tablet (4 mg total) by mouth every 8 (eight) hours as needed for nausea or vomiting., Disp: 20 tablet, Rfl: 0 .  pantoprazole (PROTONIX) 40 MG tablet, Take 1 tablet (40 mg total) by mouth daily., Disp: 30 tablet,  Rfl: 1 .  potassium chloride SA (K-DUR) 20 MEQ tablet, Take 1 tablet (20 mEq total) by mouth 2 (two) times daily for 3 days., Disp: 6 tablet, Rfl: 0 .  psyllium (HYDROCIL/METAMUCIL) 95 % PACK, Take 1 packet by mouth 2 (two) times daily., Disp: 60 each, Rfl: 1 .  rOPINIRole (REQUIP) 0.25 MG tablet, Take 1 tablet (0.25 mg total) by mouth at bedtime., Disp: 90 tablet, Rfl: 0 .  Spacer/Aero-Holding Chambers (AEROCHAMBER MINI CHAMBER) DEVI, Use with inhaler, Disp: 1 each, Rfl: 0 .  tadalafil (CIALIS) 20 MG tablet, Take 1 tablet (20 mg total) by mouth daily as needed for erectile dysfunction., Disp: 30 tablet, Rfl: 2 .  tamsulosin (FLOMAX) 0.4 MG CAPS capsule, Take 1 capsule by mouth once daily, Disp: 90 capsule, Rfl: 1 .  testosterone cypionate (DEPOTESTOTERONE CYPIONATE) 100 MG/ML injection, INJECT 100 MG (1 ML) AS DIRECTED EVERY 14 DAYS, Disp: , Rfl:  .  Testosterone Cypionate 100 MG/ML SOLN, Inject 100 mg as directed every 14 (fourteen) days., Disp: 1 vial, Rfl: 1  Current Facility-Administered Medications:  .  albuterol (VENTOLIN HFA) 108 (90 Base) MCG/ACT inhaler 2 puff, 2 puff, Inhalation, Q4H PRN, Scot Jun, FNP .  testosterone cypionate (DEPOTESTOSTERONE CYPIONATE) injection 100 mg, 100 mg, Intramuscular, Q14 Days, Isaac Bliss, Rayford Halsted, MD, 100 mg at 11/02/19 1024 .  testosterone cypionate (DEPOTESTOTERONE CYPIONATE) injection 100 mg, 100 mg, Intramuscular, Q14 Days, Isaac Bliss, Rayford Halsted, MD, 100 mg at 05/14/19 0952 .  testosterone cypionate (DEPOTESTOTERONE CYPIONATE) injection 100 mg, 100 mg, Intramuscular, Q14 Days, Isaac Bliss, Rayford Halsted, MD, 100 mg at 07/16/19 1051 .  testosterone cypionate (DEPOTESTOTERONE CYPIONATE) injection 100 mg, 100 mg, Intramuscular, Q14 Days, Isaac Bliss, Rayford Halsted, MD, 100 mg at 04/03/20 5681  Review of Systems:  Constitutional: Denies fever, chills, diaphoresis, appetite change. HEENT: Denies photophobia, eye pain, redness,  congestion, sore throat, rhinorrhea, sneezing, mouth sores, trouble swallowing, neck pain, neck stiffness and tinnitus.   Respiratory: Denies  cough, chest tightness,  and wheezing.   Cardiovascular: Denies chest pain, palpitations and leg swelling.  Gastrointestinal: Denies nausea, vomiting, abdominal pain, diarrhea, constipation, blood in stool and abdominal distention.  Genitourinary: Denies dysuria, urgency, frequency, hematuria, flank pain and difficulty urinating.  Endocrine: Denies: hot or cold intolerance, sweats, changes in hair or nails, polyuria, polydipsia. Musculoskeletal: Denies myalgias, back pain, joint swelling, arthralgias and gait problem.  Skin: Denies pallor, rash and wound.  Neurological: Denies  seizures, syncope, weakness, light-headedness, numbness and headaches.  Hematological: Denies adenopathy. Easy bruising, personal or family bleeding history  Psychiatric/Behavioral: Denies suicidal ideation, mood changes, confusion, nervousness, sleep disturbance and agitation    Physical Exam: Vitals:   06/05/20 0820  BP: 110/70  Pulse: 71  Temp: (!) 97 F (36.1 C)  TempSrc: Temporal  SpO2: 97%  Weight: 181 lb 8 oz (82.3 kg)    Body mass index is 31.15 kg/m.   Constitutional: NAD, calm, comfortable Eyes: PERRL, lids and conjunctivae normal  ENMT: Mucous membranes are moist.  Tympanic membrane is pearly white, no erythema or bulging.  Is wearing a hearing aid on the right. Respiratory: clear to auscultation bilaterally, no wheezing, no crackles. Normal respiratory effort. No accessory muscle use.  Cardiovascular: Regular rate and rhythm, no murmurs / rubs / gallops. No extremity edema. 2+ pedal pulses. Neurologic: Grossly intact and nonfocal Psychiatric: Normal judgment and insight. Alert and oriented x 3. Normal mood.    Impression and Plan:  Erectile dysfunction of organic origin  -He feels like Cialis is no longer working for him, this is a big concern and he  is requesting urology referral today.  Left ear pain  Dizziness  - Plan: Ambulatory referral to ENT -This is chronic in origin, he does not have excessive earwax or any ear infection on exam today.  Dyslipidemia -Last LDL was 98, will return for lipid check.  Essential hypertension -Well-controlled.  Leg cramps  -I will schedule him for an ABI, although I am not sure that he has PVD, he has pulses and his leg cramps and pain are more at nighttime than with exertion, can continue treating for restless leg syndrome.    Patient Instructions  -Nice seeing you today!!  -Referrals to ENT and urology requested today.  -You will be called to schedule a leg [place] to evaluate your leg cramps.  -Schedule follow up in 4 months for you physical. Please come in fasting that day.     Lelon Frohlich, MD [facility]

## 2020-06-16 DIAGNOSIS — R3912 Poor urinary stream: Secondary | ICD-10-CM | POA: Diagnosis not present

## 2020-06-16 DIAGNOSIS — R350 Frequency of micturition: Secondary | ICD-10-CM | POA: Diagnosis not present

## 2020-07-04 ENCOUNTER — Other Ambulatory Visit: Payer: Self-pay

## 2020-07-04 ENCOUNTER — Ambulatory Visit (HOSPITAL_COMMUNITY)
Admission: RE | Admit: 2020-07-04 | Discharge: 2020-07-04 | Disposition: A | Discharge status: Home / Self Care | Payer: Medicare Other | Source: Ambulatory Visit | Attending: Cardiology | Admitting: Cardiology

## 2020-07-04 DIAGNOSIS — R252 Cramp and spasm: Secondary | ICD-10-CM | POA: Diagnosis not present

## 2020-07-09 ENCOUNTER — Other Ambulatory Visit: Payer: Self-pay | Admitting: *Deleted

## 2020-07-09 ENCOUNTER — Ambulatory Visit (INDEPENDENT_AMBULATORY_CARE_PROVIDER_SITE_OTHER): Payer: Medicare Other | Admitting: Otolaryngology

## 2020-07-09 MED ORDER — FLUTICASONE PROPIONATE 50 MCG/ACT NA SUSP: 2.0000 | Freq: Every day | NASAL | 6 refills | Status: DC

## 2020-07-09 MED ORDER — TAMSULOSIN HCL 0.4 MG PO CAPS: 0.4000 mg | ORAL_CAPSULE | Freq: Every day | ORAL | 1 refills | Status: DC

## 2020-07-11 DIAGNOSIS — Z961 Presence of intraocular lens: Secondary | ICD-10-CM | POA: Diagnosis not present

## 2020-07-11 DIAGNOSIS — H04123 Dry eye syndrome of bilateral lacrimal glands: Secondary | ICD-10-CM | POA: Diagnosis not present

## 2020-07-11 DIAGNOSIS — H401122 Primary open-angle glaucoma, left eye, moderate stage: Secondary | ICD-10-CM | POA: Diagnosis not present

## 2020-07-11 DIAGNOSIS — H401111 Primary open-angle glaucoma, right eye, mild stage: Secondary | ICD-10-CM | POA: Diagnosis not present

## 2020-07-16 ENCOUNTER — Other Ambulatory Visit: Payer: Self-pay

## 2020-07-16 ENCOUNTER — Emergency Department (HOSPITAL_COMMUNITY): Payer: Medicare Other

## 2020-07-16 ENCOUNTER — Observation Stay (HOSPITAL_COMMUNITY)
Admission: EM | Admit: 2020-07-16 | Discharge: 2020-07-18 | Disposition: A | Discharge status: Home / Self Care | Payer: Medicare Other | Attending: Student | Admitting: Student

## 2020-07-16 ENCOUNTER — Encounter (HOSPITAL_COMMUNITY): Payer: Self-pay

## 2020-07-16 DIAGNOSIS — K279 Peptic ulcer, site unspecified, unspecified as acute or chronic, without hemorrhage or perforation: Secondary | ICD-10-CM | POA: Diagnosis present

## 2020-07-16 DIAGNOSIS — K922 Gastrointestinal hemorrhage, unspecified: Secondary | ICD-10-CM | POA: Diagnosis not present

## 2020-07-16 DIAGNOSIS — K921 Melena: Principal | ICD-10-CM | POA: Insufficient documentation

## 2020-07-16 DIAGNOSIS — D72829 Elevated white blood cell count, unspecified: Secondary | ICD-10-CM | POA: Diagnosis not present

## 2020-07-16 DIAGNOSIS — N400 Enlarged prostate without lower urinary tract symptoms: Secondary | ICD-10-CM | POA: Diagnosis present

## 2020-07-16 DIAGNOSIS — R42 Dizziness and giddiness: Secondary | ICD-10-CM

## 2020-07-16 DIAGNOSIS — D649 Anemia, unspecified: Secondary | ICD-10-CM

## 2020-07-16 DIAGNOSIS — Z79899 Other long term (current) drug therapy: Secondary | ICD-10-CM | POA: Diagnosis not present

## 2020-07-16 DIAGNOSIS — I10 Essential (primary) hypertension: Secondary | ICD-10-CM | POA: Diagnosis present

## 2020-07-16 DIAGNOSIS — R739 Hyperglycemia, unspecified: Secondary | ICD-10-CM | POA: Insufficient documentation

## 2020-07-16 DIAGNOSIS — D62 Acute posthemorrhagic anemia: Secondary | ICD-10-CM | POA: Diagnosis not present

## 2020-07-16 DIAGNOSIS — R404 Transient alteration of awareness: Secondary | ICD-10-CM | POA: Diagnosis not present

## 2020-07-16 DIAGNOSIS — R52 Pain, unspecified: Secondary | ICD-10-CM | POA: Diagnosis not present

## 2020-07-16 DIAGNOSIS — R58 Hemorrhage, not elsewhere classified: Secondary | ICD-10-CM | POA: Diagnosis not present

## 2020-07-16 DIAGNOSIS — K5730 Diverticulosis of large intestine without perforation or abscess without bleeding: Secondary | ICD-10-CM | POA: Diagnosis not present

## 2020-07-16 DIAGNOSIS — R55 Syncope and collapse: Secondary | ICD-10-CM | POA: Diagnosis not present

## 2020-07-16 DIAGNOSIS — K625 Hemorrhage of anus and rectum: Secondary | ICD-10-CM | POA: Diagnosis present

## 2020-07-16 DIAGNOSIS — Z743 Need for continuous supervision: Secondary | ICD-10-CM | POA: Diagnosis not present

## 2020-07-16 DIAGNOSIS — I499 Cardiac arrhythmia, unspecified: Secondary | ICD-10-CM | POA: Diagnosis not present

## 2020-07-16 DIAGNOSIS — Z20822 Contact with and (suspected) exposure to covid-19: Secondary | ICD-10-CM | POA: Diagnosis not present

## 2020-07-16 DIAGNOSIS — M170 Bilateral primary osteoarthritis of knee: Secondary | ICD-10-CM | POA: Diagnosis not present

## 2020-07-16 DIAGNOSIS — K649 Unspecified hemorrhoids: Secondary | ICD-10-CM | POA: Diagnosis present

## 2020-07-16 DIAGNOSIS — K219 Gastro-esophageal reflux disease without esophagitis: Secondary | ICD-10-CM | POA: Diagnosis present

## 2020-07-16 LAB — CBC WITH DIFFERENTIAL/PLATELET
Abs Immature Granulocytes: 0.06 10*3/uL (ref 0.00–0.07)
Basophils Absolute: 0 10*3/uL (ref 0.0–0.1)
Basophils Relative: 0 %
Eosinophils Absolute: 0.1 10*3/uL (ref 0.0–0.5)
Eosinophils Relative: 1 %
HCT: 30.2 % — ABNORMAL LOW (ref 39.0–52.0)
Hemoglobin: 10.5 g/dL — ABNORMAL LOW (ref 13.0–17.0)
Immature Granulocytes: 1 %
Lymphocytes Relative: 7 %
Lymphs Abs: 0.9 10*3/uL (ref 0.7–4.0)
MCH: 31.8 pg (ref 26.0–34.0)
MCHC: 34.8 g/dL (ref 30.0–36.0)
MCV: 91.5 fL (ref 80.0–100.0)
Monocytes Absolute: 0.6 10*3/uL (ref 0.1–1.0)
Monocytes Relative: 5 %
Neutro Abs: 10.7 10*3/uL — ABNORMAL HIGH (ref 1.7–7.7)
Neutrophils Relative %: 86 %
Platelets: 193 10*3/uL (ref 150–400)
RBC: 3.3 MIL/uL — ABNORMAL LOW (ref 4.22–5.81)
RDW: 12.7 % (ref 11.5–15.5)
WBC: 12.3 10*3/uL — ABNORMAL HIGH (ref 4.0–10.5)
nRBC: 0 % (ref 0.0–0.2)

## 2020-07-16 LAB — CBG MONITORING, ED: Glucose-Capillary: 122 mg/dL — ABNORMAL HIGH (ref 70–99)

## 2020-07-16 LAB — IRON AND TIBC
Iron: 26 ug/dL — ABNORMAL LOW (ref 45–182)
Saturation Ratios: 9 % — ABNORMAL LOW (ref 17.9–39.5)
TIBC: 285 ug/dL (ref 250–450)
UIBC: 259 ug/dL

## 2020-07-16 LAB — COMPREHENSIVE METABOLIC PANEL
ALT: 19 U/L (ref 0–44)
AST: 17 U/L (ref 15–41)
Albumin: 3.3 g/dL — ABNORMAL LOW (ref 3.5–5.0)
Alkaline Phosphatase: 40 U/L (ref 38–126)
Anion gap: 10 (ref 5–15)
BUN: 23 mg/dL (ref 8–23)
CO2: 20 mmol/L — ABNORMAL LOW (ref 22–32)
Calcium: 7.6 mg/dL — ABNORMAL LOW (ref 8.9–10.3)
Chloride: 107 mmol/L (ref 98–111)
Creatinine, Ser: 1.02 mg/dL (ref 0.61–1.24)
GFR calc Af Amer: >60 mL/min (ref >60)
GFR calc non Af Amer: >60 mL/min (ref >60)
Glucose, Bld: 168 mg/dL — ABNORMAL HIGH (ref 70–99)
Potassium: 3.9 mmol/L (ref 3.5–5.1)
Sodium: 137 mmol/L (ref 135–145)
Total Bilirubin: 0.8 mg/dL (ref 0.3–1.2)
Total Protein: 5.1 g/dL — ABNORMAL LOW (ref 6.5–8.1)

## 2020-07-16 LAB — RETICULOCYTES
Immature Retic Fract: 12.7 % (ref 2.3–15.9)
RBC.: 3.26 MIL/uL — ABNORMAL LOW (ref 4.22–5.81)
Retic Count, Absolute: 77.6 10*3/uL (ref 19.0–186.0)
Retic Ct Pct: 2.4 % (ref 0.4–3.1)

## 2020-07-16 LAB — CBC
HCT: 30.5 % — ABNORMAL LOW (ref 39.0–52.0)
Hemoglobin: 10.6 g/dL — ABNORMAL LOW (ref 13.0–17.0)
MCH: 31.8 pg (ref 26.0–34.0)
MCHC: 34.8 g/dL (ref 30.0–36.0)
MCV: 91.6 fL (ref 80.0–100.0)
Platelets: 208 10*3/uL (ref 150–400)
RBC: 3.33 MIL/uL — ABNORMAL LOW (ref 4.22–5.81)
RDW: 12.8 % (ref 11.5–15.5)
WBC: 12.4 10*3/uL — ABNORMAL HIGH (ref 4.0–10.5)
nRBC: 0 % (ref 0.0–0.2)

## 2020-07-16 LAB — TYPE AND SCREEN
ABO/RH(D): A POS
Antibody Screen: NEGATIVE

## 2020-07-16 LAB — HEMOGLOBIN AND HEMATOCRIT, BLOOD
HCT: 27.5 % — ABNORMAL LOW (ref 39.0–52.0)
Hemoglobin: 9.6 g/dL — ABNORMAL LOW (ref 13.0–17.0)

## 2020-07-16 LAB — FERRITIN: Ferritin: 38 ng/mL (ref 24–336)

## 2020-07-16 LAB — FOLATE: Folate: 12.5 ng/mL (ref >5.9)

## 2020-07-16 LAB — PROTIME-INR
INR: 1.1 (ref 0.8–1.2)
Prothrombin Time: 13.9 seconds (ref 11.4–15.2)

## 2020-07-16 LAB — VITAMIN B12: Vitamin B-12: 568 pg/mL (ref 180–914)

## 2020-07-16 LAB — APTT: aPTT: 20 seconds — ABNORMAL LOW (ref 24–36)

## 2020-07-16 LAB — SARS CORONAVIRUS 2 BY RT PCR (HOSPITAL ORDER, PERFORMED IN ~~LOC~~ HOSPITAL LAB): SARS Coronavirus 2: NEGATIVE

## 2020-07-16 MED ORDER — SENNOSIDES-DOCUSATE SODIUM 8.6-50 MG PO TABS: 1.0000 | ORAL_TABLET | Freq: Every evening | ORAL | Status: DC | PRN

## 2020-07-16 MED ORDER — DOCUSATE SODIUM 100 MG PO CAPS
100.0000 mg | ORAL_CAPSULE | Freq: Two times a day (BID) | ORAL | Status: DC
  Administered 2020-07-16 – 2020-07-18 (×4): 100 mg via ORAL
  Filled 2020-07-16 (×4): qty 1

## 2020-07-16 MED ORDER — SODIUM CHLORIDE 0.9 % IV SOLN: INTRAVENOUS | Status: DC

## 2020-07-16 MED ORDER — BISACODYL 10 MG RE SUPP: 10.0000 mg | Freq: Every day | RECTAL | Status: DC | PRN

## 2020-07-16 MED ORDER — SODIUM CHLORIDE 0.9 % IV BOLUS
1000.0000 mL | Freq: Once | INTRAVENOUS | Status: AC
  Administered 2020-07-16: 1000 mL via INTRAVENOUS

## 2020-07-16 MED ORDER — FLUTICASONE FUROATE-VILANTEROL 100-25 MCG/INH IN AEPB: 1.0000 | INHALATION_SPRAY | Freq: Every day | RESPIRATORY_TRACT | Status: DC

## 2020-07-16 MED ORDER — ONDANSETRON HCL 4 MG PO TABS: 4.0000 mg | ORAL_TABLET | Freq: Four times a day (QID) | ORAL | Status: DC | PRN

## 2020-07-16 MED ORDER — TAMSULOSIN HCL 0.4 MG PO CAPS
0.4000 mg | ORAL_CAPSULE | Freq: Every day | ORAL | Status: DC
  Administered 2020-07-17 – 2020-07-18 (×2): 0.4 mg via ORAL
  Filled 2020-07-16 (×2): qty 1

## 2020-07-16 MED ORDER — ONDANSETRON HCL 4 MG/2ML IJ SOLN: 4.0000 mg | Freq: Four times a day (QID) | INTRAMUSCULAR | Status: DC | PRN

## 2020-07-16 MED ORDER — POTASSIUM CHLORIDE IN NACL 20-0.9 MEQ/L-% IV SOLN
INTRAVENOUS | Status: DC
  Filled 2020-07-16 (×4): qty 1000

## 2020-07-16 NOTE — ED Notes (Signed)
Pt ambulated to bathroom with no assistance. Urine clear and pale yellow. Pt reported BM passed with small amount of blood

## 2020-07-16 NOTE — ED Notes (Addendum)
ED TO INPATIENT HANDOFF REPORT  ED Nurse Name and Phone #: Fredonia Highland [phone]  S Name/Age/Gender David Goodman 79 y.o. male Room/Bed: WA24/WA24  Code Status   Code Status: Full Code  Home/SNF/Other Home Patient oriented to: self, place, time and situation Is this baseline? Yes   Triage Complete: Triage complete  Chief Complaint Acute lower GI bleeding [K92.2]  Triage Note Pt arrived via [facility] for syncopal episode.   Pt diaphoretic and pale with ems C/O lightheadedness   Litter NS given by EMS   Here at ED pt had BM with EMS that was confirmed blood   BP-80/50 New BP after fluids-100/60  A/Ox4  Hx. Diverticulitis   Denies abd pain and no hx of gi bleed   Takes a lot of ibuprofen per pt   PVCs on EKG. Pt states ectopic beats are normal for him.     Allergies Allergies  Allergen Reactions  . Lisinopril Cough    Level of Care/Admitting Diagnosis ED Disposition    ED Disposition Condition Comment   Admit  Hospital Area: [facility] [[id number]]  Level of Care: Telemetry [5]  Admit to tele based on following criteria: Eval of Syncope  May admit patient to [name] or [facility] if equivalent level of care is available:: Yes  Covid Evaluation: Asymptomatic Screening Protocol (No Symptoms)  Diagnosis: Acute lower GI bleeding [619509]  Admitting Physician: Mercy Riding [[id number]]  Attending Physician: Mercy Riding [[id number]]  Estimated length of stay: past midnight tomorrow  Certification:: I certify this patient will need inpatient services for at least 2 midnights       B Medical/Surgery History Past Medical History:  Diagnosis Date  . ALLERGIC RHINITIS 07/13/2007  . HYPERLIPIDEMIA 07/13/2007  . HYPERTENSION 07/13/2007  . 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 PEPTIC ULCER DISEASE 07/13/2007  . 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 COLONOSCOPY WITH PROPOFOL N/A 02/17/2019    Procedure: COLONOSCOPY WITH PROPOFOL;  Surgeon: Ronnette Juniper, MD;  Location: [facility];  Service: Gastroenterology;  Laterality: N/A;  . IR ANGIOGRAM VISCERAL SELECTIVE  02/16/2019  . IR ANGIOGRAM VISCERAL SELECTIVE  02/16/2019  . IR ANGIOGRAM VISCERAL SELECTIVE  02/16/2019  . IR US GUIDE VASC ACCESS RIGHT  02/16/2019  . ROTATOR CUFF REPAIR       A IV Location/Drains/Wounds Patient Lines/Drains/Airways Status    Active Line/Drains/Airways    Name Placement date Placement time Site Days   Peripheral IV 02/13/19 Left;Medial Forearm 02/13/19  2117  Forearm  519   Peripheral IV 02/17/19 Left;Anterior Forearm 02/17/19  0844  Forearm  515   Peripheral IV 07/16/20 Anterior;Left;Proximal Forearm 07/16/20  1116  Forearm  less than 1   Peripheral IV 07/16/20 Right Antecubital 07/16/20  1121  Antecubital  less than 1          Intake/Output Last 24 hours  Intake/Output Summary (Last 24 hours) at 07/16/2020 1927 Last data filed at 07/16/2020 1823 Gross per 24 hour  Intake 1000 ml  Output --  Net 1000 ml    Labs/Imaging Results for orders placed or performed during the hospital encounter of 07/16/20 (from the past 48 hour(s))  Comprehensive metabolic panel     Status: Abnormal   Collection Time: 07/16/20 11:23 AM  Result Value Ref Range   Sodium 137 135 - 145 mmol/L   Potassium 3.9 3.5 - 5.1 mmol/L   Chloride 107 98 - 111 mmol/L   CO2 20 (  L) 22 - 32 mmol/L   Glucose, Bld 168 (H) 70 - 99 mg/dL    Comment: Glucose reference range applies only to samples taken after fasting for at least 8 hours.   BUN 23 8 - 23 mg/dL   Creatinine, Ser 1.02 0.61 - 1.24 mg/dL   Calcium 7.6 (L) 8.9 - 10.3 mg/dL   Total Protein 5.1 (L) 6.5 - 8.1 g/dL   Albumin 3.3 (L) 3.5 - 5.0 g/dL   AST 17 15 - 41 U/L   ALT 19 0 - 44 U/L   Alkaline Phosphatase 40 38 - 126 U/L   Total Bilirubin 0.8 0.3 - 1.2 mg/dL   GFR calc non Af Amer >60 >60 mL/min   GFR calc Af Amer >60 >60 mL/min   Anion gap 10 5 - 15     Comment: Performed at [facility], [place] [address] [place], [place] [address]  CBC WITH DIFFERENTIAL     Status: Abnormal   Collection Time: 07/16/20 11:23 AM  Result Value Ref Range   WBC 12.3 (H) 4.0 - 10.5 K/uL   RBC 3.30 (L) 4.22 - 5.81 MIL/uL   Hemoglobin 10.5 (L) 13.0 - 17.0 g/dL   HCT 30.2 (L) 39 - 52 %   MCV 91.5 80.0 - 100.0 fL   MCH 31.8 26.0 - 34.0 pg   MCHC 34.8 30.0 - 36.0 g/dL   RDW 12.7 11.5 - 15.5 %   Platelets 193 150 - 400 K/uL   nRBC 0.0 0.0 - 0.2 %   Neutrophils Relative % 86 %   Neutro Abs 10.7 (H) 1.7 - 7.7 K/uL   Lymphocytes Relative 7 %   Lymphs Abs 0.9 0.7 - 4.0 K/uL   Monocytes Relative 5 %   Monocytes Absolute 0.6 0 - 1 K/uL   Eosinophils Relative 1 %   Eosinophils Absolute 0.1 0 - 0 K/uL   Basophils Relative 0 %   Basophils Absolute 0.0 0 - 0 K/uL   Immature Granulocytes 1 %   Abs Immature Granulocytes 0.06 0.00 - 0.07 K/uL    Comment: Performed at [facility], [place] [address], [place], [place] [address]  Type and screen [place]     Status: None   Collection Time: 07/16/20 11:23 AM  Result Value Ref Range   ABO/RH(D) A POS    Antibody Screen NEG    Sample Expiration      07/19/2020,2359 Performed at [facility], [place] [address], [place], [place] [address]   SARS Coronavirus 2 by RT PCR (hospital order, performed in [facility] hospital lab) Nasopharyngeal Nasopharyngeal Swab     Status: None   Collection Time: 07/16/20 12:17 PM   Specimen: Nasopharyngeal Swab  Result Value Ref Range   SARS Coronavirus 2 NEGATIVE NEGATIVE    Comment: (NOTE) SARS-CoV-2 target nucleic acids are NOT DETECTED.  The SARS-CoV-2 RNA is generally detectable in upper and lower respiratory specimens during the acute phase of infection. The lowest concentration of SARS-CoV-2 viral copies this assay can detect is 250 copies / mL. A negative result does not preclude SARS-CoV-2 infection and  should not be used as the sole basis for treatment or other patient management decisions.  A negative result may occur with improper specimen collection / handling, submission of specimen other than nasopharyngeal swab, presence of viral mutation(s) within the areas targeted by this assay, and inadequate number of viral copies (<250 copies / mL). A negative result must  be combined with clinical observations, patient history, and epidemiological information.  Fact Sheet for Patients:   StrictlyIdeas.no  Fact Sheet for Healthcare Providers: BankingDealers.co.za  This test is not yet approved or  cleared by the [place] FDA and has been authorized for detection and/or diagnosis of SARS-CoV-2 by FDA under an Emergency Use Authorization (EUA).  This EUA will remain in effect (meaning this test can be used) for the duration of the COVID-19 declaration under Section 564(b)(1) of the Act, 21 U.S.C. section 360bbb-3(b)(1), unless the authorization is terminated or revoked sooner.  Performed at [facility], [place] [address], [place], [place] [address]   CBG monitoring, ED     Status: Abnormal   Collection Time: 07/16/20 12:22 PM  Result Value Ref Range   Glucose-Capillary 122 (H) 70 - 99 mg/dL    Comment: Glucose reference range applies only to samples taken after fasting for at least 8 hours.  APTT     Status: Abnormal   Collection Time: 07/16/20  3:51 PM  Result Value Ref Range   aPTT 20 (L) 24 - 36 seconds    Comment: Performed at [facility], [place] [address], [place], [place] [address]  Protime-INR     Status: None   Collection Time: 07/16/20  3:51 PM  Result Value Ref Range   Prothrombin Time 13.9 11.4 - 15.2 seconds   INR 1.1 0.8 - 1.2    Comment: (NOTE) INR goal varies based on device and disease states. Performed at [facility], [place] [address], [place], [place]  [address]   CBC     Status: Abnormal   Collection Time: 07/16/20  3:51 PM  Result Value Ref Range   WBC 12.4 (H) 4.0 - 10.5 K/uL   RBC 3.33 (L) 4.22 - 5.81 MIL/uL   Hemoglobin 10.6 (L) 13.0 - 17.0 g/dL   HCT 30.5 (L) 39 - 52 %   MCV 91.6 80.0 - 100.0 fL   MCH 31.8 26.0 - 34.0 pg   MCHC 34.8 30.0 - 36.0 g/dL   RDW 12.8 11.5 - 15.5 %   Platelets 208 150 - 400 K/uL   nRBC 0.0 0.0 - 0.2 %    Comment: Performed at [facility], [place] [address] [place], [place] [address]  Vitamin B12     Status: None   Collection Time: 07/16/20  3:51 PM  Result Value Ref Range   Vitamin B-12 568 180 - 914 pg/mL    Comment: (NOTE) This assay is not validated for testing neonatal or myeloproliferative syndrome specimens for Vitamin B12 levels. Performed at [facility], [place] [address], [place], [place] [address]   Folate     Status: None   Collection Time: 07/16/20  3:51 PM  Result Value Ref Range   Folate 12.5 >5.9 ng/mL    Comment: Performed at [facility], [place] [address] [place], [place] [address]  Iron and TIBC     Status: Abnormal   Collection Time: 07/16/20  3:51 PM  Result Value Ref Range   Iron 26 (L) 45 - 182 ug/dL   TIBC 285 250 - 450 ug/dL   Saturation Ratios 9 (L) 17.9 - 39.5 %   UIBC 259 ug/dL    Comment: Performed at [facility], [place] [address] [place], [place] [address]  Ferritin     Status: None   Collection Time: 07/16/20  3:51 PM  Result Value Ref Range  Ferritin 38 24 - 336 ng/mL    Comment: Performed at [facility], [place] [address], [place], [place] [address]  Reticulocytes     Status: Abnormal   Collection Time: 07/16/20  3:51 PM  Result Value Ref Range   Retic Ct Pct 2.4 0.4 - 3.1 %   RBC. 3.26 (L) 4.22 - 5.81 MIL/uL   Retic Count, Absolute 77.6 19.0 - 186.0 K/uL   Immature Retic Fract 12.7 2.3 - 15.9 %    Comment: Performed at [facility], [place]  [address], [place], [place] [address]   DG Chest Port 1 View  Result Date: 07/16/2020 CLINICAL DATA:  Syncope EXAM: PORTABLE CHEST 1 VIEW COMPARISON:  January 01, 2020 FINDINGS: The lungs are clear. Heart size and pulmonary vascularity are normal. No adenopathy. No pneumothorax. Apparent previous trauma involving lateral left clavicle. IMPRESSION: Lungs clear.  Cardiac silhouette normal. Electronically Signed   By: Lowella Grip III M.D.   On: 07/16/2020 12:00    Pending Labs Unresulted Labs (From admission, onward) Comment          Start     Ordered   07/17/20 3825  Basic metabolic panel  Tomorrow morning,   R        07/16/20 1440   07/17/20 0500  CBC  Tomorrow morning,   R        07/16/20 1440   07/17/20 0500  Hemoglobin A1c  Tomorrow morning,   R        07/16/20 1544   07/16/20 1440  Hemoglobin and hematocrit, blood  Now then every 6 hours,   R      07/16/20 1440          Vitals/Pain Today's Vitals   07/16/20 1614 07/16/20 1749 07/16/20 1815 07/16/20 1822  BP: 114/62 119/67 133/74 133/74  Pulse: 81 82 81 81  Resp: (!) 25 15 16 13   Temp:      TempSrc:      SpO2: 100% 100% 100% 100%  PainSc:        Isolation Precautions No active isolations  Medications Medications  sodium chloride 0.9 % bolus 1,000 mL (0 mLs Intravenous Stopped 07/16/20 1217)    And  0.9 %  sodium chloride infusion ( Intravenous Stopped 07/16/20 1823)  tamsulosin (FLOMAX) capsule 0.4 mg (has no administration in time range)  docusate sodium (COLACE) capsule 100 mg (has no administration in time range)  senna-docusate (Senokot-S) tablet 1 tablet (has no administration in time range)  bisacodyl (DULCOLAX) suppository 10 mg (has no administration in time range)  ondansetron (ZOFRAN) tablet 4 mg (has no administration in time range)    Or  ondansetron (ZOFRAN) injection 4 mg (has no administration in time range)  0.9 % NaCl with KCl 20 mEq/ L  infusion ( Intravenous New Bag/Given 07/16/20 1629)     Mobility walks with person assist High fall risk   Focused Assessments GI   R Recommendations: See Admitting Provider Note  Report given to: Lenell Antu RN  Additional Notes:

## 2020-07-16 NOTE — Consult Note (Signed)
[facility] Consult  Referring Provider: Carmin Muskrat, MD/ER Primary Care Physician:  Isaac Bliss, Rayford Halsted, MD Primary Gastroenterologist: Dr. Barron Sch[facility]  Reason for Consultation: Rectal bleeding  HPI: David Goodman is a 79 y.o. male history of diverticulosis and rectal bleeding presented to the ER with rectal bleeding that started today morning. Patient was in his usual state of health until today morning when he had 2 regular bowel movements however, he noticed fresh blood with her bowel movement.  Thereafter, he had 2 more bloody bowel movement.  Patient went to his orthopedic surgeon and received steroid injections to both his knees and went to work.  He felt lightheaded and asked his workers to call EMS and was subsequently brought to the ER.  Patient denies loss of consciousness.  Since coming to the ER he has had 1 more bloody bowel movement.  Patient has been taking 3 tablets of ibuprofen twice a day for the last 4 days for knee pain.  He is not on aspirin, antiplatelets or anticoagulation otherwise. Patient states at home his bowel movements are regular. He denies abdominal or rectal pain. He denies unintentional weight loss or loss of appetite. He denies acid reflux, heartburn, difficulty swallowing or pain on swallowing.  Past Medical History:  Diagnosis Date  . ALLERGIC RHINITIS 07/13/2007  . HYPERLIPIDEMIA 07/13/2007  . HYPERTENSION 07/13/2007  . 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 PEPTIC ULCER DISEASE 07/13/2007  . 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 COLONOSCOPY WITH PROPOFOL N/A 02/17/2019   Procedure: COLONOSCOPY WITH PROPOFOL;  Surgeon: Ronnette Juniper, MD;  Location: [facility];  Service: Gastroenterology;  Laterality: N/A;  . IR ANGIOGRAM VISCERAL SELECTIVE  02/16/2019  . IR ANGIOGRAM VISCERAL SELECTIVE  02/16/2019  . IR ANGIOGRAM VISCERAL SELECTIVE  02/16/2019  . IR US GUIDE VASC ACCESS  RIGHT  02/16/2019  . ROTATOR CUFF REPAIR      Prior to Admission medications   Medication Sig Start Date End Date Taking? Authorizing Provider  fluticasone (FLONASE) 50 MCG/ACT nasal spray Place 2 sprays into both nostrils daily. 07/09/20  Yes Isaac Bliss, Rayford Halsted, MD  ibuprofen (ADVIL) 200 MG tablet Take 600 mg by mouth every 6 (six) hours as needed for mild pain or moderate pain.   Yes [provider]  losartan (COZAAR) 100 MG tablet Take 1 tablet (100 mg total) by mouth daily. 01/01/20  Yes Isaac Bliss, Rayford Halsted, MD  MAGNESIUM PO Take 1 tablet by mouth daily.   Yes [provider]  naproxen (NAPROSYN) 500 MG tablet TAKE 1 TABLET BY MOUTH TWO  TIMES DAILY WITH MEALS Patient taking differently: Take 500 mg by mouth 2 (two) times daily as needed for mild pain.  02/28/20  Yes Isaac Bliss, Rayford Halsted, MD  pantoprazole (PROTONIX) 40 MG tablet Take 1 tablet (40 mg total) by mouth daily. 02/18/19  Yes Gherghe, Vella Redhead, MD  Psyllium (METAMUCIL PO) Take 1 packet by mouth daily.    Yes [provider]  tamsulosin (FLOMAX) 0.4 MG CAPS capsule Take 1 capsule (0.4 mg total) by mouth daily. 07/09/20  Yes Isaac Bliss, Rayford Halsted, MD  XIIDRA 5 % SOLN Place 1 drop into both eyes at bedtime.  07/14/20  Yes [provider]  clonazePAM (KLONOPIN) 0.5 MG tablet Take 0.5 tablets (0.25 mg total) by mouth at bedtime as needed for anxiety. Patient not taking: Reported on 07/16/2020 12/11/19   Isaac Bliss, Rayford Halsted, MD  fluticasone  furoate-vilanterol (BREO ELLIPTA) 100-25 MCG/INH AEPB Inhale 1 puff into the lungs daily. Patient not taking: Reported on 07/16/2020 02/06/20   Candee Furbish, MD  HYDROcodone-homatropine [facility]) 5-1.5 MG/5ML syrup Take 5 mLs by mouth every 8 (eight) hours as needed for cough. Patient not taking: Reported on 07/16/2020 12/12/19   Scot Jun, FNP  latanoprost (XALATAN) 0.005 % ophthalmic solution Place 1 drop into both eyes at  bedtime. Patient not taking: Reported on 07/16/2020 06/20/17   Marletta Lor, MD  levocetirizine (XYZAL) 5 MG tablet TAKE 1 TABLET BY MOUTH  EVERY EVENING Patient not taking: Reported on 07/16/2020 12/30/17   Marletta Lor, MD  ondansetron (ZOFRAN) 4 MG tablet Take 1 tablet (4 mg total) by mouth every 8 (eight) hours as needed for nausea or vomiting. Patient not taking: Reported on 07/16/2020 11/19/19   Eulas Post, MD  potassium chloride SA (K-DUR) 20 MEQ tablet Take 1 tablet (20 mEq total) by mouth 2 (two) times daily for 3 days. Patient not taking: Reported on 07/16/2020 07/20/19 06/05/20  Isaac Bliss, Rayford Halsted, MD  psyllium (HYDROCIL/METAMUCIL) 95 % PACK Take 1 packet by mouth 2 (two) times daily. Patient not taking: Reported on 07/16/2020 02/18/19   Caren Griffins, MD  rOPINIRole (REQUIP) 0.25 MG tablet Take 1 tablet (0.25 mg total) by mouth at bedtime. Patient not taking: Reported on 07/16/2020 10/17/19   Isaac Bliss, Rayford Halsted, MD  Spacer/Aero-Holding Chambers (AEROCHAMBER MINI CHAMBER) DEVI Use with inhaler Patient not taking: Reported on 07/16/2020 12/12/19   Scot Jun, FNP  tadalafil (CIALIS) 20 MG tablet Take 1 tablet (20 mg total) by mouth daily as needed for erectile dysfunction. Patient not taking: Reported on 07/16/2020 03/06/20   Isaac Bliss, Rayford Halsted, MD  Testosterone Cypionate 100 MG/ML SOLN Inject 100 mg as directed every 14 (fourteen) days. Patient not taking: Reported on 07/16/2020 12/05/18   Isaac Bliss, Rayford Halsted, MD    Current Facility-Administered Medications  Medication Dose Route Frequency Provider Last Rate Last Admin  . 0.9 %  sodium chloride infusion   Intravenous Continuous Carmin Muskrat, MD 125 mL/hr at 07/16/20 1223 New Bag at 07/16/20 1223  . 0.9 % NaCl with KCl 20 mEq/ L  infusion   Intravenous Continuous Mercy Riding, MD 75 mL/hr at 07/16/20 1629 New Bag at 07/16/20 1629  . albuterol (VENTOLIN HFA) 108 (90 Base) MCG/ACT  inhaler 2 puff  2 puff Inhalation Q4H PRN Scot Jun, FNP      . bisacodyl (DULCOLAX) suppository 10 mg  10 mg Rectal Daily PRN Gonfa, Taye T, MD      . docusate sodium (COLACE) capsule 100 mg  100 mg Oral BID Gonfa, Taye T, MD      . ondansetron (ZOFRAN) tablet 4 mg  4 mg Oral Q6H PRN Gonfa, Taye T, MD       Or  . ondansetron (ZOFRAN) injection 4 mg  4 mg Intravenous Q6H PRN Gonfa, Taye T, MD      . senna-docusate (Senokot-S) tablet 1 tablet  1 tablet Oral QHS PRN Mercy Riding, MD      . Derrill Memo ON 07/17/2020] tamsulosin (FLOMAX) capsule 0.4 mg  0.4 mg Oral Daily Gonfa, Taye T, MD      . testosterone cypionate (DEPOTESTOSTERONE CYPIONATE) injection 100 mg  100 mg Intramuscular Q14 Days Isaac Bliss, Rayford Halsted, MD   100 mg at 11/02/19 1024  . testosterone cypionate (DEPOTESTOTERONE CYPIONATE) injection 100 mg  100 mg  Intramuscular Q14 Days Isaac Bliss, Rayford Halsted, MD   100 mg at 05/14/19 5809  . testosterone cypionate (DEPOTESTOTERONE CYPIONATE) injection 100 mg  100 mg Intramuscular Q14 Days Isaac Bliss, Rayford Halsted, MD   100 mg at 07/16/19 1051  . testosterone cypionate (DEPOTESTOTERONE CYPIONATE) injection 100 mg  100 mg Intramuscular Q14 Days Isaac Bliss, Rayford Halsted, MD   100 mg at 04/03/20 9833   Current Outpatient Medications  Medication Sig Dispense Refill  . fluticasone (FLONASE) 50 MCG/ACT nasal spray Place 2 sprays into both nostrils daily. 16 g 6  . ibuprofen (ADVIL) 200 MG tablet Take 600 mg by mouth every 6 (six) hours as needed for mild pain or moderate pain.    Marland Kitchen losartan (COZAAR) 100 MG tablet Take 1 tablet (100 mg total) by mouth daily. 90 tablet 1  . MAGNESIUM PO Take 1 tablet by mouth daily.    . naproxen (NAPROSYN) 500 MG tablet TAKE 1 TABLET BY MOUTH TWO  TIMES DAILY WITH MEALS (Patient taking differently: Take 500 mg by mouth 2 (two) times daily as needed for mild pain. ) 180 tablet 1  . pantoprazole (PROTONIX) 40 MG tablet Take 1 tablet (40 mg total) by  mouth daily. 30 tablet 1  . Psyllium (METAMUCIL PO) Take 1 packet by mouth daily.     . tamsulosin (FLOMAX) 0.4 MG CAPS capsule Take 1 capsule (0.4 mg total) by mouth daily. 90 capsule 1  . XIIDRA 5 % SOLN Place 1 drop into both eyes at bedtime.     . clonazePAM (KLONOPIN) 0.5 MG tablet Take 0.5 tablets (0.25 mg total) by mouth at bedtime as needed for anxiety. (Patient not taking: Reported on 07/16/2020) 20 tablet 1  . fluticasone furoate-vilanterol (BREO ELLIPTA) 100-25 MCG/INH AEPB Inhale 1 puff into the lungs daily. (Patient not taking: Reported on 07/16/2020) 30 each 3  . HYDROcodone-homatropine (HYCODAN) 5-1.5 MG/5ML syrup Take 5 mLs by mouth every 8 (eight) hours as needed for cough. (Patient not taking: Reported on 07/16/2020) 100 mL 0  . latanoprost (XALATAN) 0.005 % ophthalmic solution Place 1 drop into both eyes at bedtime. (Patient not taking: Reported on 07/16/2020) 7.5 mL 1  . levocetirizine (XYZAL) 5 MG tablet TAKE 1 TABLET BY MOUTH  EVERY EVENING (Patient not taking: Reported on 07/16/2020) 90 tablet 2  . ondansetron (ZOFRAN) 4 MG tablet Take 1 tablet (4 mg total) by mouth every 8 (eight) hours as needed for nausea or vomiting. (Patient not taking: Reported on 07/16/2020) 20 tablet 0  . potassium chloride SA (K-DUR) 20 MEQ tablet Take 1 tablet (20 mEq total) by mouth 2 (two) times daily for 3 days. (Patient not taking: Reported on 07/16/2020) 6 tablet 0  . psyllium (HYDROCIL/METAMUCIL) 95 % PACK Take 1 packet by mouth 2 (two) times daily. (Patient not taking: Reported on 07/16/2020) 60 each 1  . rOPINIRole (REQUIP) 0.25 MG tablet Take 1 tablet (0.25 mg total) by mouth at bedtime. (Patient not taking: Reported on 07/16/2020) 90 tablet 0  . Spacer/Aero-Holding Chambers (AEROCHAMBER MINI CHAMBER) DEVI Use with inhaler (Patient not taking: Reported on 07/16/2020) 1 each 0  . tadalafil (CIALIS) 20 MG tablet Take 1 tablet (20 mg total) by mouth daily as needed for erectile dysfunction. (Patient not  taking: Reported on 07/16/2020) 30 tablet 2  . Testosterone Cypionate 100 MG/ML SOLN Inject 100 mg as directed every 14 (fourteen) days. (Patient not taking: Reported on 07/16/2020) 1 vial 1    Allergies as of 07/16/2020 - Review  Complete 07/16/2020  Allergen Reaction Noted  . Lisinopril Cough 06/06/2015    History reviewed. No pertinent family history.  Social History   Socioeconomic History  . Marital status: Married    Spouse name: Not on file  . Number of children: Not on file  . Years of education: Not on file  . Highest education level: Not on file  Occupational History  . Not on file  Tobacco Use  . Smoking status: Never Smoker  . Smokeless tobacco: Never Used  Vaping Use  . Vaping Use: Never used  Substance and Sexual Activity  . Alcohol use: Yes    Comment: couple times a week - wine  . Drug use: No  . Sexual activity: Not Currently  Other Topics Concern  . Not on file  Social History Narrative  . Not on file   Social Determinants of Health   Financial Resource Strain:   . Difficulty of Paying Living Expenses:   Food Insecurity:   . Worried About Charity fundraiser in the Last Year:   . Arboriculturist in the Last Year:   Transportation Needs:   . Film/video editor (Medical):   Marland Kitchen Lack of Transportation (Non-Medical):   Physical Activity:   . Days of Exercise per Week:   . Minutes of Exercise per Session:   Stress:   . Feeling of Stress :   Social Connections:   . Frequency of Communication with Friends and Family:   . Frequency of Social Gatherings with Friends and Family:   . Attends Religious Services:   . Active Member of Clubs or Organizations:   . Attends Archivist Meetings:   Marland Kitchen Marital Status:   Intimate Partner Violence:   . Fear of Current or Ex-Partner:   . Emotionally Abused:   Marland Kitchen Physically Abused:   . Sexually Abused:     Review of Systems: Positive for: GI: Described in detail in HPI.    Gen: Denies any fever,  chills, rigors, night sweats, anorexia, fatigue, weakness, malaise, involuntary weight loss, and sleep disorder CV: Denies chest pain, angina, palpitations, syncope, orthopnea, PND, peripheral edema, and claudication. Resp: Denies dyspnea, cough, sputum, wheezing, coughing up blood. GU : Denies urinary burning, blood in urine, urinary frequency, urinary hesitancy, nocturnal urination, and urinary incontinence. MS: Bilateral knee pain Derm: Denies rash, itching, oral ulcerations, hives, unhealing ulcers.  Psych: Denies depression, anxiety, memory loss, suicidal ideation, hallucinations,  and confusion. Heme: Rectal bleeding, denies bruising, and enlarged lymph nodes. Neuro:  Denies any headaches, dizziness, paresthesias. Endo:  Denies any problems with DM, thyroid, adrenal function.  Physical Exam: Vital signs in last 24 hours: Temp:  [97.6 F (36.4 C)] 97.6 F (36.4 C) (08/18 1115) Pulse Rate:  [64-81] 81 (08/18 1614) Resp:  [12-25] 25 (08/18 1614) BP: (99-123)/(52-77) 114/62 (08/18 1614) SpO2:  [96 %-100 %] 100 % (08/18 1614)    General:   Alert,  Well-developed, well-nourished, pleasant and cooperative in NAD Head:  Normocephalic and atraumatic. Eyes:  Sclera clear, no icterus.   Conjunctiva pink. Ears:  Normal auditory acuity. Nose:  No deformity, discharge,  or lesions. Mouth:  No deformity or lesions.  Oropharynx pink & moist. Neck:  Supple; no masses or thyromegaly. Lungs:  Clear throughout to auscultation.   No wheezes, crackles, or rhonchi. No acute distress. Heart:  Regular rate and rhythm; no murmurs, clicks, rubs,  or gallops. Extremities:  Without clubbing or edema. Neurologic:  Alert and  oriented x4;  grossly normal neurologically. Skin:  Intact without significant lesions or rashes. Psych:  Alert and cooperative. Normal mood and affect. Abdomen:  Soft, nontender and nondistended. No masses, hepatosplenomegaly or hernias noted. Normal bowel sounds, without guarding,  and without rebound.         Lab Results: Recent Labs    07/16/20 1123  WBC 12.3*  HGB 10.5*  HCT 30.2*  PLT 193   BMET Recent Labs    07/16/20 1123  NA 137  K 3.9  CL 107  CO2 20*  GLUCOSE 168*  BUN 23  CREATININE 1.02  CALCIUM 7.6*   LFT Recent Labs    07/16/20 1123  PROT 5.1*  ALBUMIN 3.3*  AST 17  ALT 19  ALKPHOS 40  BILITOT 0.8   PT/INR No results for input(s): LABPROT, INR in the last 72 hours.  Studies/Results: DG Chest Port 1 View  Result Date: 07/16/2020 CLINICAL DATA:  Syncope EXAM: PORTABLE CHEST 1 VIEW COMPARISON:  January 01, 2020 FINDINGS: The lungs are clear. Heart size and pulmonary vascularity are normal. No adenopathy. No pneumothorax. Apparent previous trauma involving lateral left clavicle. IMPRESSION: Lungs clear.  Cardiac silhouette normal. Electronically Signed   By: Lowella Grip III M.D.   On: 07/16/2020 12:00    Impression: Painless hematochezia, hemoglobin 10.5 on presentation, hemoglobin was 13.9 on 02/2020  Last colonoscopy from 02/17/2019 performed for hematochezia showed diverticulosis in sigmoid, descending, splenic flexure, transverse colon, hepatic flexure and ascending colon.  When he presented similarly in March 2020 he had a bleeding scan which showed active bleeding in splenic flexure and subsequent IR visceral angiogram was negative for lower GI bleeds embolization was not performed at that point  Currently has stable hemodynamics, blood pressure 114/ 62 mmHg, heart rate 81, respiratory rate 25, saturating 100% on room air  Plan: Presumed diverticular bleed Supportive management Clear liquid diet monitor H&H and transfuse if needed If patient has continued rectal bleeding, recommend stat bleeding scan as he may need IR guided embolization with continued rectal bleeding or hemodynamic instability. We will continue to follow. Discussed the same with the patient who verbalized understanding.   LOS: 0 days   Ronnette Juniper, MD  07/16/2020, 4:30 PM

## 2020-07-16 NOTE — H&P (Signed)
History and Physical    David Goodman NGE:[id number] DOB: 08-13-1941 DOA: 07/16/2020  PCP: Isaac Bliss, Rayford Halsted, MD Patient coming from: Home  Chief Complaint: Bleeding  HPI: David Goodman is a 79 y.o. male with history of HTN, PUD, diverticulosis, BPH and osteoarthritis presenting with rectal bleeding.  Patient woke up earlier this morning and had 2 "normal" bowel movements.  He went to the bathroom for the third time and noted fresh red blood in his stool.  He had two more bloody bowel movement after that. He then went to see his orthopedic surgeon and had steroid injection to bilateral knees.  Patient felt lightheaded when he went to work, and asked coworkers to call EMS and he was brought to ED.  He denies passing out or falling.  He denies chest pain, palpitation, shortness of breath, fever, chills, nausea, vomiting, abdominal pain or UTI symptoms.  Patient is fully vaccinated against COVID-19.  Of note, he has been taking 3 tablets of ibuprofen twice a day for the last 3 days for his knee pain.  He is not on blood thinner.  Patient lives with his wife.  Denies smoking cigarettes.  Admits to occasional alcohol.  Denies recreational drug use.  In ED, slightly soft blood pressures.  Not tachycardic.  Glucose 168.  BUN 23.  WBC 12.3.  Hgb 10.5 (13.9 about 4 months ago).  He was typed and screened.  [facility] consulted and hospital service confirmation.  COVID-19 PCR was negative.    ROS All review of system negative except for pertinent positives and negatives as history of present illness above.  PMH Past Medical History:  Diagnosis Date  . ALLERGIC RHINITIS 07/13/2007  . HYPERLIPIDEMIA 07/13/2007  . HYPERTENSION 07/13/2007  . 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 PEPTIC ULCER DISEASE 07/13/2007  . 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 COLONOSCOPY WITH PROPOFOL N/A 02/17/2019   Procedure: COLONOSCOPY WITH PROPOFOL;   Surgeon: Ronnette Juniper, MD;  Location: [facility];  Service: Gastroenterology;  Laterality: N/A;  . IR ANGIOGRAM VISCERAL SELECTIVE  02/16/2019  . IR ANGIOGRAM VISCERAL SELECTIVE  02/16/2019  . IR ANGIOGRAM VISCERAL SELECTIVE  02/16/2019  . IR US GUIDE VASC ACCESS RIGHT  02/16/2019  . ROTATOR CUFF REPAIR     Fam HX Denies family history of colon cancer  Social Hx  reports that he has never smoked. He has never used smokeless tobacco. He reports current alcohol use. He reports that he does not use drugs.  Allergy Allergies  Allergen Reactions  . Lisinopril Cough   Home Meds Prior to Admission medications   Medication Sig Start Date End Date Taking? Authorizing Provider  fluticasone (FLONASE) 50 MCG/ACT nasal spray Place 2 sprays into both nostrils daily. 07/09/20  Yes Isaac Bliss, Rayford Halsted, MD  ibuprofen (ADVIL) 200 MG tablet Take 600 mg by mouth every 6 (six) hours as needed for mild pain or moderate pain.   Yes [provider]  losartan (COZAAR) 100 MG tablet Take 1 tablet (100 mg total) by mouth daily. 01/01/20  Yes Isaac Bliss, Rayford Halsted, MD  MAGNESIUM PO Take 1 tablet by mouth daily.   Yes [provider]  naproxen (NAPROSYN) 500 MG tablet TAKE 1 TABLET BY MOUTH TWO  TIMES DAILY WITH MEALS Patient taking differently: Take 500 mg by mouth 2 (two) times daily as needed for mild pain.  02/28/20  Yes Isaac Bliss, Rayford Halsted, MD  pantoprazole (PROTONIX) 40 MG  tablet Take 1 tablet (40 mg total) by mouth daily. 02/18/19  Yes Gherghe, Vella Redhead, MD  Psyllium (METAMUCIL PO) Take 1 packet by mouth daily.    Yes [provider]  tamsulosin (FLOMAX) 0.4 MG CAPS capsule Take 1 capsule (0.4 mg total) by mouth daily. 07/09/20  Yes Isaac Bliss, Rayford Halsted, MD  XIIDRA 5 % SOLN Place 1 drop into both eyes at bedtime.  07/14/20  Yes [provider]  clonazePAM (KLONOPIN) 0.5 MG tablet Take 0.5 tablets (0.25 mg total) by mouth at bedtime as needed for  anxiety. Patient not taking: Reported on 07/16/2020 12/11/19   Isaac Bliss, Rayford Halsted, MD  fluticasone furoate-vilanterol (BREO ELLIPTA) 100-25 MCG/INH AEPB Inhale 1 puff into the lungs daily. Patient not taking: Reported on 07/16/2020 02/06/20   Candee Furbish, MD  HYDROcodone-homatropine [facility]) 5-1.5 MG/5ML syrup Take 5 mLs by mouth every 8 (eight) hours as needed for cough. Patient not taking: Reported on 07/16/2020 12/12/19   Scot Jun, FNP  latanoprost (XALATAN) 0.005 % ophthalmic solution Place 1 drop into both eyes at bedtime. Patient not taking: Reported on 07/16/2020 06/20/17   Marletta Lor, MD  levocetirizine (XYZAL) 5 MG tablet TAKE 1 TABLET BY MOUTH  EVERY EVENING Patient not taking: Reported on 07/16/2020 12/30/17   Marletta Lor, MD  ondansetron (ZOFRAN) 4 MG tablet Take 1 tablet (4 mg total) by mouth every 8 (eight) hours as needed for nausea or vomiting. Patient not taking: Reported on 07/16/2020 11/19/19   Eulas Post, MD  potassium chloride SA (K-DUR) 20 MEQ tablet Take 1 tablet (20 mEq total) by mouth 2 (two) times daily for 3 days. Patient not taking: Reported on 07/16/2020 07/20/19 06/05/20  Isaac Bliss, Rayford Halsted, MD  psyllium (HYDROCIL/METAMUCIL) 95 % PACK Take 1 packet by mouth 2 (two) times daily. Patient not taking: Reported on 07/16/2020 02/18/19   Caren Griffins, MD  rOPINIRole (REQUIP) 0.25 MG tablet Take 1 tablet (0.25 mg total) by mouth at bedtime. Patient not taking: Reported on 07/16/2020 10/17/19   Isaac Bliss, Rayford Halsted, MD  Spacer/Aero-Holding Chambers (AEROCHAMBER MINI CHAMBER) DEVI Use with inhaler Patient not taking: Reported on 07/16/2020 12/12/19   Scot Jun, FNP  tadalafil (CIALIS) 20 MG tablet Take 1 tablet (20 mg total) by mouth daily as needed for erectile dysfunction. Patient not taking: Reported on 07/16/2020 03/06/20   Isaac Bliss, Rayford Halsted, MD  Testosterone Cypionate 100 MG/ML SOLN Inject 100 mg as directed  every 14 (fourteen) days. Patient not taking: Reported on 07/16/2020 12/05/18   Isaac Bliss, Rayford Halsted, MD    Physical Exam: Vitals:   07/16/20 1202 07/16/20 1213 07/16/20 1245 07/16/20 1330  BP: (!) 100/52 (!) 104/53 (!) 107/57 (!) 109/59  Pulse: 71 69 78 80  Resp: 14 13 17 17   Temp:      TempSrc:      SpO2: 96% 98% 96% 97%    GENERAL: No acute distress.  Appears well.  HEENT: MMM.  Vision and hearing grossly intact.  NECK: Supple.  No apparent JVD.  RESP:  No IWOB. Good air movement bilaterally. CVS:  RRR. Heart sounds normal.  ABD/GI/GU: Bowel sounds present. Soft. Non tender.  MSK/EXT:  Moves extremities. No apparent deformity or edema.  SKIN: no apparent skin lesion or wound NEURO: Awake, alert and oriented appropriately.  No gross deficit.  PSYCH: Calm. Normal affect.   Personally Reviewed Radiological Exams DG Chest Port 1 View  Result Date: 07/16/2020  CLINICAL DATA:  Syncope EXAM: PORTABLE CHEST 1 VIEW COMPARISON:  January 01, 2020 FINDINGS: The lungs are clear. Heart size and pulmonary vascularity are normal. No adenopathy. No pneumothorax. Apparent previous trauma involving lateral left clavicle. IMPRESSION: Lungs clear.  Cardiac silhouette normal. Electronically Signed   By: Lowella Grip III M.D.   On: 07/16/2020 12:00     Personally Reviewed Labs: CBC: Recent Labs  Lab 07/16/20 1123  WBC 12.3*  NEUTROABS 10.7*  HGB 10.5*  HCT 30.2*  MCV 91.5  PLT 048   Basic Metabolic Panel: Recent Labs  Lab 07/16/20 1123  NA 137  K 3.9  CL 107  CO2 20*  GLUCOSE 168*  BUN 23  CREATININE 1.02  CALCIUM 7.6*   GFR: CrCl cannot be calculated (Unknown ideal weight.). Liver Function Tests: Recent Labs  Lab 07/16/20 1123  AST 17  ALT 19  ALKPHOS 40  BILITOT 0.8  PROT 5.1*  ALBUMIN 3.3*   No results for input(s): LIPASE, AMYLASE in the last 168 hours. No results for input(s): AMMONIA in the last 168 hours. Coagulation Profile: No results for  input(s): INR, PROTIME in the last 168 hours. Cardiac Enzymes: No results for input(s): CKTOTAL, CKMB, CKMBINDEX, TROPONINI in the last 168 hours. BNP (last 3 results) No results for input(s): PROBNP in the last 8760 hours. HbA1C: No results for input(s): HGBA1C in the last 72 hours. CBG: Recent Labs  Lab 07/16/20 1222  GLUCAP 122*   Lipid Profile: No results for input(s): CHOL, HDL, LDLCALC, TRIG, CHOLHDL, LDLDIRECT in the last 72 hours. Thyroid Function Tests: No results for input(s): TSH, T4TOTAL, FREET4, T3FREE, THYROIDAB in the last 72 hours. Anemia Panel: No results for input(s): VITAMINB12, FOLATE, FERRITIN, TIBC, IRON, RETICCTPCT in the last 72 hours. Urine analysis:    Component Value Date/Time   COLORURINE LT. YELLOW 05/22/2012 0824   APPEARANCEUR CLEAR 05/22/2012 0824   LABSPEC 1.025 05/22/2012 0824   PHURINE 6.0 05/22/2012 0824   GLUCOSEU NEGATIVE 05/22/2012 0824   HGBUR NEGATIVE 05/22/2012 0824   HGBUR negative 07/09/2009 0852   BILIRUBINUR n 09/21/2016 0925   KETONESUR NEGATIVE 05/22/2012 0824   PROTEINUR n 09/21/2016 0925   UROBILINOGEN 0.2 09/21/2016 0925   UROBILINOGEN 0.2 05/22/2012 0824   NITRITE n 09/21/2016 0925   NITRITE NEGATIVE 05/22/2012 0824   LEUKOCYTESUR Negative 09/21/2016 0925    Sepsis Labs:  None  Personally Reviewed EKG:  12-lead EKG with sinus rhythm and occasional PVCs.   Assessment/Plan Acute lower GI bleed in patient with history of diverticulosis-could be exacerbated by recent NSAID use.  Hgb 10.5 (from 13.9 about 4 months ago). -Admit to telemetry. -Monitor H&H.  Check coag labs.  -Secured to PIV lines -IVF -Clear liquid diet -Follow GI recommendations-consulted by EDP -Counseled against NSAIDs.  Symptomatic acute blood loss anemia due to GI bleed -Check anemia panel -Other management as above  Presyncope: Likely due to the above 2.  EKG with occasional PVCs. -Monitor on telemetry  Essential hypertension: BP on the  lower side of normal. -Hold home BP meds -IV fluid  Hyperglycemia without diagnosis of diabetes -Check hemoglobin A1c  History of BPH -Continue home Flomax  Leukocytosis with bandemia-likely demargination. -Continue monitoring   DVT prophylaxis: SCD in the setting of GI bleed  Code Status: Full code Family Communication: Updated patient's wife at bedside  Disposition Plan: Admit to telemetry Consults called: [facility] by EDP Admission status: Inpatient  Severity of Illness: The appropriate patient status for this patient is INPATIENT.  Inpatient status is judged to be reasonable and necessary in order to provide the required intensity of service to ensure the patient's safety. The patient's presenting symptoms, physical exam findings, and initial radiographic and laboratory data in the context of their chronic comorbidities is felt to place them at high risk for further clinical deterioration. Furthermore, it is not anticipated that the patient will be medically stable for discharge from the hospital within 2 midnights of admission. The following factors support the patient status of inpatient.    "           The patient's presenting symptoms include rectal bleed, presyncope "           The worrisome physical exam findings include low blood pressure "           The initial radiographic and laboratory data are worrisome because Hgb dropped about 4 g from baseline "           The chronic co-morbidities include hypertension     I certify that at the point of admission it is my clinical judgment that the patient will require inpatient hospital care spanning beyond 2 midnights from the point of admission due to high intensity of service, high risk for further deterioration and high frequency of surveillance required.   Mercy Riding MD [facility]  If 7PM-7AM, please contact night-coverage [email or web address]  07/16/2020, 1:55 PM

## 2020-07-16 NOTE — ED Notes (Signed)
Floor is in the process of approving the bed.

## 2020-07-16 NOTE — ED Provider Notes (Signed)
[facility] DEPT Provider Note   CSN: [id number] Arrival date & time: 07/16/20  1030     History Chief Complaint  Patient presents with  . Loss of Consciousness  . Rectal Bleeding    David Goodman is a 79 y.o. male.  HPI    Patient presents after episode of syncope, with associated GI bleed. Patient acknowledges a history of diverticulitis, but states that he was generally well. Today, the patient had an episode of bright red blood per rectum after his morning bowel movement. He has no abdominal pain. He went to work, however, and once there, he was found by a coworker passed out at his desk. Patient denies recalling events just prior to losing consciousness, denies chest pain either before or afterwards, denies any ongoing pain either. He does have associated lightheadedness. He arrives via EMS and history is obtained by those providers as well. Per report, EMS providers notes that the patient was awake, alert, but hypotensive on arrival. He received fluid bolus, with some improvement in his vital signs, though with systolic less than 371 after resuscitation. He reportedly also had multiple bouts of ectopy on rhythm strip.  Past Medical History:  Diagnosis Date  . ALLERGIC RHINITIS 07/13/2007  . HYPERLIPIDEMIA 07/13/2007  . HYPERTENSION 07/13/2007  . 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 PEPTIC ULCER DISEASE 07/13/2007  . 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 RLS (restless legs syndrome) 10/17/2019  . Acute GI bleeding 02/13/2019  . Syncope 11/15/2018  . Diverticulitis 05/25/2018  . Acute blood loss anemia 05/25/2018  . GI bleeding 05/24/2018  . 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 Impaired glucose tolerance 09/27/2016  . GERD (gastroesophageal reflux disease) 09/27/2016  . BPH  (benign prostatic hyperplasia) 06/18/2013  . 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 Dyslipidemia 07/13/2007  . Essential hypertension 07/13/2007  . Allergic rhinitis 07/13/2007  . PEPTIC ULCER DISEASE 07/13/2007  . 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 COLONOSCOPY WITH PROPOFOL N/A 02/17/2019   Procedure: COLONOSCOPY WITH PROPOFOL;  Surgeon: Ronnette Juniper, MD;  Location: [facility];  Service: Gastroenterology;  Laterality: N/A;  . IR ANGIOGRAM VISCERAL SELECTIVE  02/16/2019  . IR ANGIOGRAM VISCERAL SELECTIVE  02/16/2019  . IR ANGIOGRAM VISCERAL SELECTIVE  02/16/2019  . IR US GUIDE VASC ACCESS RIGHT  02/16/2019  . ROTATOR CUFF REPAIR         History reviewed. No pertinent family history.  Social History   Tobacco Use  . Smoking status: Never Smoker  . Smokeless tobacco: Never Used  Vaping Use  . Vaping Use: Never used  Substance Use Topics  . Alcohol use: Yes    Comment: couple times a week - wine  . Drug use: No    Home Medications Prior to Admission medications   Medication Sig Start Date End Date Taking? Authorizing Provider  fluticasone (FLONASE) 50 MCG/ACT nasal spray Place 2 sprays into both nostrils daily. 07/09/20  Yes Isaac Bliss, Rayford Halsted, MD  ibuprofen (ADVIL) 200 MG tablet Take 600 mg by mouth every 6 (six) hours as needed for mild pain or moderate pain.   Yes [provider]  losartan (COZAAR) 100 MG tablet Take 1 tablet (100 mg total) by mouth daily. 01/01/20  Yes Erline Hau, MD  MAGNESIUM PO Take 1 tablet by  mouth daily.   Yes [provider]  naproxen (NAPROSYN) 500 MG tablet TAKE 1 TABLET BY MOUTH TWO  TIMES DAILY WITH MEALS Patient taking differently: Take 500 mg by mouth 2 (two) times daily as needed for mild pain.  02/28/20  Yes Isaac Bliss, Rayford Halsted, MD  pantoprazole (PROTONIX) 40 MG tablet Take 1 tablet (40 mg total) by mouth daily. 02/18/19  Yes Gherghe,  Vella Redhead, MD  Psyllium (METAMUCIL PO) Take 1 packet by mouth daily.    Yes [provider]  tamsulosin (FLOMAX) 0.4 MG CAPS capsule Take 1 capsule (0.4 mg total) by mouth daily. 07/09/20  Yes Isaac Bliss, Rayford Halsted, MD  XIIDRA 5 % SOLN Place 1 drop into both eyes at bedtime.  07/14/20  Yes [provider]  clonazePAM (KLONOPIN) 0.5 MG tablet Take 0.5 tablets (0.25 mg total) by mouth at bedtime as needed for anxiety. Patient not taking: Reported on 07/16/2020 12/11/19   Isaac Bliss, Rayford Halsted, MD  fluticasone furoate-vilanterol (BREO ELLIPTA) 100-25 MCG/INH AEPB Inhale 1 puff into the lungs daily. Patient not taking: Reported on 07/16/2020 02/06/20   Candee Furbish, MD  HYDROcodone-homatropine [facility]) 5-1.5 MG/5ML syrup Take 5 mLs by mouth every 8 (eight) hours as needed for cough. Patient not taking: Reported on 07/16/2020 12/12/19   Scot Jun, FNP  latanoprost (XALATAN) 0.005 % ophthalmic solution Place 1 drop into both eyes at bedtime. Patient not taking: Reported on 07/16/2020 06/20/17   Marletta Lor, MD  levocetirizine (XYZAL) 5 MG tablet TAKE 1 TABLET BY MOUTH  EVERY EVENING Patient not taking: Reported on 07/16/2020 12/30/17   Marletta Lor, MD  ondansetron (ZOFRAN) 4 MG tablet Take 1 tablet (4 mg total) by mouth every 8 (eight) hours as needed for nausea or vomiting. Patient not taking: Reported on 07/16/2020 11/19/19   Eulas Post, MD  potassium chloride SA (K-DUR) 20 MEQ tablet Take 1 tablet (20 mEq total) by mouth 2 (two) times daily for 3 days. Patient not taking: Reported on 07/16/2020 07/20/19 06/05/20  Isaac Bliss, Rayford Halsted, MD  psyllium (HYDROCIL/METAMUCIL) 95 % PACK Take 1 packet by mouth 2 (two) times daily. Patient not taking: Reported on 07/16/2020 02/18/19   Caren Griffins, MD  rOPINIRole (REQUIP) 0.25 MG tablet Take 1 tablet (0.25 mg total) by mouth at bedtime. Patient not taking: Reported on 07/16/2020 10/17/19   Isaac Bliss, Rayford Halsted, MD  Spacer/Aero-Holding Chambers (AEROCHAMBER MINI CHAMBER) DEVI Use with inhaler Patient not taking: Reported on 07/16/2020 12/12/19   Scot Jun, FNP  tadalafil (CIALIS) 20 MG tablet Take 1 tablet (20 mg total) by mouth daily as needed for erectile dysfunction. Patient not taking: Reported on 07/16/2020 03/06/20   Isaac Bliss, Rayford Halsted, MD  Testosterone Cypionate 100 MG/ML SOLN Inject 100 mg as directed every 14 (fourteen) days. Patient not taking: Reported on 07/16/2020 12/05/18   Isaac Bliss, Rayford Halsted, MD    Allergies    Lisinopril  Review of Systems   Review of Systems  Constitutional:       Per HPI, otherwise negative  HENT:       Per HPI, otherwise negative  Respiratory:       Per HPI, otherwise negative  Cardiovascular:       Per HPI, otherwise negative  Gastrointestinal: Positive for blood in stool. Negative for abdominal pain and vomiting.  Endocrine:       Negative aside from HPI  Genitourinary:  Neg aside from HPI   Musculoskeletal:       Per HPI, otherwise negative  Skin: Negative.   Neurological: Positive for syncope and weakness.    Physical Exam Updated Vital Signs BP (!) 107/57   Pulse 78   Temp 97.6 F (36.4 C) (Oral)   Resp 17   SpO2 96%   Physical Exam Vitals and nursing note reviewed.  Constitutional:      General: He is not in acute distress.    Appearance: He is well-developed.  HENT:     Head: Normocephalic and atraumatic.  Eyes:     Conjunctiva/sclera: Conjunctivae normal.  Cardiovascular:     Rate and Rhythm: Normal rate and regular rhythm.  Pulmonary:     Effort: Pulmonary effort is normal. No respiratory distress.     Breath sounds: No stridor.  Abdominal:     General: There is no distension.     Tenderness: There is no abdominal tenderness. There is no guarding.     Comments: Patient had one episode of melena immediately after ED arrival  Skin:    General: Skin is warm and dry.     Coloration:  Skin is not pale.  Neurological:     Mental Status: He is alert and oriented to person, place, and time.     Deep Tendon Reflexes: Abnormal reflex: .now.  Psychiatric:        Mood and Affect: Mood normal.     ED Results / Procedures / Treatments   Labs (all labs ordered are listed, but only abnormal results are displayed) Labs Reviewed  COMPREHENSIVE METABOLIC PANEL - Abnormal; Notable for the following components:      Result Value   CO2 20 (*)    Glucose, Bld 168 (*)    Calcium 7.6 (*)    Total Protein 5.1 (*)    Albumin 3.3 (*)    All other components within normal limits  CBC WITH DIFFERENTIAL/PLATELET - Abnormal; Notable for the following components:   WBC 12.3 (*)    RBC 3.30 (*)    Hemoglobin 10.5 (*)    HCT 30.2 (*)    Neutro Abs 10.7 (*)    All other components within normal limits  CBG MONITORING, ED - Abnormal; Notable for the following components:   Glucose-Capillary 122 (*)    All other components within normal limits  SARS CORONAVIRUS 2 BY RT PCR (HOSPITAL ORDER, [place] [facility])  TYPE AND SCREEN    EKG EKG Interpretation  Date/Time:  Wednesday July 16 2020 11:17:35 EDT Ventricular Rate:  73 PR Interval:    QRS Duration: 103 QT Interval:  435 QTC Calculation: 480 R Axis:   13 Text Interpretation: Sinus rhythm Ventricular premature complex Borderline prolonged QT interval Abnormal ECG Confirmed by Carmin Muskrat [id number]) on 07/16/2020 11:23:50 AM   Radiology DG Chest Port 1 View  Result Date: 07/16/2020 CLINICAL DATA:  Syncope EXAM: PORTABLE CHEST 1 VIEW COMPARISON:  January 01, 2020 FINDINGS: The lungs are clear. Heart size and pulmonary vascularity are normal. No adenopathy. No pneumothorax. Apparent previous trauma involving lateral left clavicle. IMPRESSION: Lungs clear.  Cardiac silhouette normal. Electronically Signed   By: Lowella Grip III M.D.   On: 07/16/2020 12:00    Procedures Procedures (including critical  care time)  Medications Ordered in ED Medications  sodium chloride 0.9 % bolus 1,000 mL (0 mLs Intravenous Stopped 07/16/20 1217)    And  0.9 %  sodium chloride infusion ( Intravenous  New Bag/Given 07/16/20 1223)    ED Course  I have reviewed the triage vital signs and the nursing notes.  Pertinent labs & imaging results that were available during my care of the patient were reviewed by me and considered in my medical decision making (see chart for details).     After arrival, with consideration of GI bleed, syncope, the patient had ongoing fluid resuscitation, was placed on continuous monitoring. Patient had labs, type and screen ordered.  Update: Patient's blood pressure is somewhat better after initial fluid resuscitation, he remains awake and alert. MDM Rules/Calculators/A&P                          1:15 PM Blood pressure remains stable. Labs notable for hemoglobin 10.5, down from baseline 14. I discussed her case with Dr. Therisa Doyne, of the patient's GI team, they will follow as a consulting service.  This patient with a history of diverticulosis presents after episode of syncope in the context of ongoing GI bleed. Patient received fluid resuscitation, type and screen, labs immediately after arrival. Patient had improvement in his blood pressure, had no additional episodes syncope.  However, given the patient's ongoing GI bleed, after discussion with his GI service, he required admission for further monitoring, management.    Final Clinical Impression(s) / ED Diagnoses Final diagnoses:  Syncope and collapse  Lower GI bleed   CRITICAL CARE Performed by: Carmin Muskrat Total critical care time: 35 minutes Critical care time was exclusive of separately billable procedures and treating other patients. Critical care was necessary to treat or prevent imminent or life-threatening deterioration. Critical care was time spent personally by me on the following activities: development  of treatment plan with patient and/or surrogate as well as nursing, discussions with consultants, evaluation of patient's response to treatment, examination of patient, obtaining history from patient or surrogate, ordering and performing treatments and interventions, ordering and review of laboratory studies, ordering and review of radiographic studies, pulse oximetry and re-evaluation of patient's condition.    Carmin Muskrat, MD 07/16/20 1318

## 2020-07-16 NOTE — ED Triage Notes (Signed)
Pt arrived via [facility] for syncopal episode.   Pt diaphoretic and pale with ems C/O lightheadedness   Litter NS given by EMS   Here at ED pt had BM with EMS that was confirmed blood   BP-80/50 New BP after fluids-100/60  A/Ox4  Hx. Diverticulitis   Denies abd pain and no hx of gi bleed   Takes a lot of ibuprofen per pt   PVCs on EKG. Pt states ectopic beats are normal for him.

## 2020-07-17 DIAGNOSIS — D62 Acute posthemorrhagic anemia: Secondary | ICD-10-CM | POA: Diagnosis not present

## 2020-07-17 DIAGNOSIS — K5730 Diverticulosis of large intestine without perforation or abscess without bleeding: Secondary | ICD-10-CM | POA: Diagnosis not present

## 2020-07-17 DIAGNOSIS — K5731 Diverticulosis of large intestine without perforation or abscess with bleeding: Secondary | ICD-10-CM | POA: Diagnosis not present

## 2020-07-17 DIAGNOSIS — K649 Unspecified hemorrhoids: Secondary | ICD-10-CM | POA: Diagnosis present

## 2020-07-17 LAB — BASIC METABOLIC PANEL
Anion gap: 5 (ref 5–15)
BUN: 17 mg/dL (ref 8–23)
CO2: 20 mmol/L — ABNORMAL LOW (ref 22–32)
Calcium: 7.7 mg/dL — ABNORMAL LOW (ref 8.9–10.3)
Chloride: 113 mmol/L — ABNORMAL HIGH (ref 98–111)
Creatinine, Ser: 0.82 mg/dL (ref 0.61–1.24)
GFR calc Af Amer: >60 mL/min (ref >60)
GFR calc non Af Amer: >60 mL/min (ref >60)
Glucose, Bld: 111 mg/dL — ABNORMAL HIGH (ref 70–99)
Potassium: 4 mmol/L (ref 3.5–5.1)
Sodium: 138 mmol/L (ref 135–145)

## 2020-07-17 LAB — CBC
HCT: 25.3 % — ABNORMAL LOW (ref 39.0–52.0)
Hemoglobin: 8.9 g/dL — ABNORMAL LOW (ref 13.0–17.0)
MCH: 32.8 pg (ref 26.0–34.0)
MCHC: 35.2 g/dL (ref 30.0–36.0)
MCV: 93.4 fL (ref 80.0–100.0)
Platelets: 181 10*3/uL (ref 150–400)
RBC: 2.71 MIL/uL — ABNORMAL LOW (ref 4.22–5.81)
RDW: 13.1 % (ref 11.5–15.5)
WBC: 7.2 10*3/uL (ref 4.0–10.5)
nRBC: 0 % (ref 0.0–0.2)

## 2020-07-17 LAB — HEMOGLOBIN A1C
Hgb A1c MFr Bld: 5.2 % (ref 4.8–5.6)
Mean Plasma Glucose: 102.54 mg/dL

## 2020-07-17 LAB — HEMOGLOBIN AND HEMATOCRIT, BLOOD
HCT: 26.2 % — ABNORMAL LOW (ref 39.0–52.0)
HCT: 26.2 % — ABNORMAL LOW (ref 39.0–52.0)
HCT: 26.2 % — ABNORMAL LOW (ref 39.0–52.0)
Hemoglobin: 9 g/dL — ABNORMAL LOW (ref 13.0–17.0)
Hemoglobin: 9 g/dL — ABNORMAL LOW (ref 13.0–17.0)
Hemoglobin: 9.2 g/dL — ABNORMAL LOW (ref 13.0–17.0)

## 2020-07-17 MED ORDER — LOSARTAN POTASSIUM 50 MG PO TABS
50.0000 mg | ORAL_TABLET | Freq: Every day | ORAL | Status: DC
  Administered 2020-07-17 – 2020-07-18 (×2): 50 mg via ORAL
  Filled 2020-07-17 (×2): qty 1

## 2020-07-17 MED ORDER — PSYLLIUM 95 % PO PACK
1.0000 | PACK | Freq: Two times a day (BID) | ORAL | Status: DC
  Administered 2020-07-17 – 2020-07-18 (×3): 1 via ORAL
  Filled 2020-07-17 (×3): qty 1

## 2020-07-17 MED ORDER — ACETAMINOPHEN 325 MG PO TABS
650.0000 mg | ORAL_TABLET | Freq: Four times a day (QID) | ORAL | Status: DC | PRN
  Administered 2020-07-17: 650 mg via ORAL
  Filled 2020-07-17: qty 2

## 2020-07-17 NOTE — Progress Notes (Signed)
PROGRESS NOTE  Johnie Makki BOF:[id number] DOB: 03-Jul-1941 DOA: 07/16/2020 PCP: Isaac Bliss, Rayford Halsted, MD  Brief History   Alphonse Asbridge is a 79 y.o. male with history of HTN, PUD, diverticulosis, BPH and osteoarthritis presenting with rectal bleeding.  Patient woke up earlier this morning and had 2 "normal" bowel movements.  He went to the bathroom for the third time and noted fresh red blood in his stool.  He had two more bloody bowel movement after that. He then went to see his orthopedic surgeon and had steroid injection to bilateral knees.  Patient felt lightheaded when he went to work, and asked coworkers to call EMS and he was brought to ED.  He denies passing out or falling.  He denies chest pain, palpitation, shortness of breath, fever, chills, nausea, vomiting, abdominal pain or UTI symptoms.  Patient is fully vaccinated against COVID-19.  Of note, he has been taking 3 tablets of ibuprofen twice a day for the last 3 days for his knee pain.  He is not on blood thinner.  Patient lives with his wife.  Denies smoking cigarettes.  Admits to occasional alcohol.  Denies recreational drug use.  In ED, slightly soft blood pressures.  Not tachycardic.  Glucose 168.  BUN 23.  WBC 12.3.  Hgb 10.5 (13.9 about 4 months ago).  He was typed and screened.  [facility] consulted and hospital service confirmation.  COVID-19 PCR was negative.  [facility] have admitted the patient to a telemetry bed. Dr. Therisa Doyne has seen the patient. She has recommended monitoring the patient and repeating a bleeding scan should he begin to bleed briskly again.  Consultants  . Gastroenterology  Procedures  . None  Antibiotics   Anti-infectives (From admission, onward)   None    .  Subjective  The patient is resting comfortably. He states that he is only seeing a little blood on the tissue after wiping.  Objective   Vitals:  Vitals:   07/17/20 0552 07/17/20 1256  BP: 134/82 (!) 156/88  Pulse: 88  83  Resp: 18 18  Temp: 97.8 F (36.6 C) 97.8 F (36.6 C)  SpO2: 100% 99%   Exam:  Constitutional:  . The patient is awake, alert, and oriented x 3. No acute distress. Respiratory:  . No increased work of breathing. . No wheezes, rales, or rhonchi . No tactile fremitus Cardiovascular:  . Regular rate and rhythm . No murmurs, ectopy, or gallups. . No lateral PMI. No thrills. Abdomen:  . Abdomen is soft, non-tender, non-distended . No hernias, masses, or organomegaly . Normoactive bowel sounds.  Musculoskeletal:  . No cyanosis, clubbing, or edema Skin:  . No rashes, lesions, ulcers . palpation of skin: no induration or nodules Neurologic:  . CN 2-12 intact . Sensation all 4 extremities intact Psychiatric:  . Mental status o Mood, affect appropriate o Orientation to person, place, time  . judgment and insight appear intact  I have personally reviewed the following:   Today's Data  . Vitals, CBC, BMP, H&H  Scheduled Meds: . docusate sodium  100 mg Oral BID  . psyllium  1 packet Oral BID  . tamsulosin  0.4 mg Oral Daily   Continuous Infusions: . sodium chloride Stopped (07/16/20 1823)   Problem  Hemorrhoids  Acute Lower GI Bleeding  Acute Blood Loss Anemia  GI Bleeding  Gerd (Gastroesophageal Reflux Disease)  Bph (Benign Prostatic Hyperplasia)  Essential Hypertension  Pud (Peptic Ulcer Disease)     A & P  Acute blood loss anemia: Symptomatic with presyncope. Telemetry demonstrated PVC's. Monitor H&H and transfuse for hemoglobin less than 7.0.  Acute lower GI bleed: Presumed to be diverticular. The patient has been evaluated by GI. They recommend monitoring hemoglobin and hematochezia. Bleeding scan will be ordered should the patient begin to bleed briskly. Stable at 9.0 currently. Hgb was 13.9 in April, and 10.6 on admission. The patient is currently only seeing blood on tissue paper after wiping raising the possible that this bleeding could be due to  hemorrhoids. The patient also has a history of PUD, but continues to use NSAIDS (advil and Naprosyn) for pain at home.  GERD/History of PUD: Noted. Continue PPI as at home  Essential Hypertension: Blood pressure a little high. Will restart Losartan as at home. Monitor.  BPH: Continue Flonase  I have seen and examined this patient myself. I have spent 34 minutes in his evaluation and car.e  DVT Prophylaxis: SCD's CODE STATUS: Full Code Family Communication: None available Disposition:  Status is: Inpatient  Remains inpatient appropriate because:Inpatient level of care appropriate due to severity of illness  Dispo: The patient is from: Home              Anticipated d/c is to: Home              Anticipated d/c date is: 1 day              Patient currently is not medically stable to d/c.  Mirely Pangle, DO [facility] Direct contact: see [email or web address]  7PM-7AM contact night coverage as above 07/17/2020, 4:25 PM  LOS: 1 day

## 2020-07-17 NOTE — Progress Notes (Signed)
[facility] Progress Note  David Goodman 79 y.o. 11/14/1941  CC:  Rectal bleeding  Subjective: Patient reports decreased bleeding, states he only has a small amount of red blood when he wipes.  Has only passed a small amount of stool.  Denies any abdominal pain.  Feels fatigued but denies any chest pain or shortness of breath.  ROS : Review of Systems  Constitutional: Positive for malaise/fatigue.  Cardiovascular: Negative for chest pain and palpitations.  Gastrointestinal: Positive for blood in stool. Negative for abdominal pain, constipation, diarrhea, heartburn, melena, nausea and vomiting.    Objective: Vital signs in last 24 hours: Vitals:   07/17/20 0552 07/17/20 1256  BP: 134/82 (!) 156/88  Pulse: 88 83  Resp: 18 18  Temp: 97.8 F (36.6 C) 97.8 F (36.6 C)  SpO2: 100% 99%    Physical Exam:  General:  Lethargic, oriented, cooperative, no acute distress  Head:  Normocephalic, without obvious abnormality, atraumatic  Eyes:  Mild conjunctival pallor, EOM's intact,   Lungs:   Clear to auscultation bilaterally, respirations unlabored  Heart:  Regular rate and rhythm, S1/S2 normal  Abdomen:   Soft, non-tender, non-distended, bowel sounds active all four quadrants,  no guarding or peritoneal signs.  Extremities: Extremities normal, atraumatic, no  edema  Pulses: 2+ and symmetric    Lab Results: Recent Labs    07/16/20 1123 07/17/20 0212  NA 137 138  K 3.9 4.0  CL 107 113*  CO2 20* 20*  GLUCOSE 168* 111*  BUN 23 17  CREATININE 1.02 0.82  CALCIUM 7.6* 7.7*   Recent Labs    07/16/20 1123  AST 17  ALT 19  ALKPHOS 40  BILITOT 0.8  PROT 5.1*  ALBUMIN 3.3*   Recent Labs    07/16/20 1123 07/16/20 1123 07/16/20 1551 07/16/20 2049 07/17/20 0212 07/17/20 0825  WBC 12.3*   < > 12.4*  --  7.2  --   NEUTROABS 10.7*  --   --   --   --   --   HGB 10.5*   < > 10.6*   < > 8.9* 9.0*  HCT 30.2*   < > 30.5*   < > 25.3* 26.2*  MCV 91.5   < > 91.6  --   93.4  --   PLT 193   < > 208  --  181  --    < > = values in this interval not displayed.   Recent Labs    07/16/20 1551  LABPROT 13.9  INR 1.1      Assessment: Painless hematochezia, most consistent with diverticular bleeding.   -Bleeding appears to have slowed, though patient's Hgb is now 8.9 as compared to 9.6 yesterday and 10.6 on 8/18. -Hemodynamically stable: regular HR with normotensive to mildly increased BP  Plan: Continue supportive care.  Soft diet with metamucil BID.  Continue to monitor H&H with transfusion as needed to maintain Hgb >7.  If recurrence of bleeding, recommend CT-A vs. Nuclear bleeding scan with IR consultation if positive.  If no further bleeding and Hgb remains stable, possible discharge tomorrow.  [facility] will follow.  Salley Slaughter PA-C 07/17/2020, 1:42 PM  Contact #  [phone]

## 2020-07-18 DIAGNOSIS — D649 Anemia, unspecified: Secondary | ICD-10-CM | POA: Diagnosis not present

## 2020-07-18 DIAGNOSIS — K5731 Diverticulosis of large intestine without perforation or abscess with bleeding: Secondary | ICD-10-CM | POA: Diagnosis not present

## 2020-07-18 DIAGNOSIS — K922 Gastrointestinal hemorrhage, unspecified: Secondary | ICD-10-CM | POA: Diagnosis present

## 2020-07-18 DIAGNOSIS — D62 Acute posthemorrhagic anemia: Secondary | ICD-10-CM | POA: Diagnosis not present

## 2020-07-18 DIAGNOSIS — K5730 Diverticulosis of large intestine without perforation or abscess without bleeding: Secondary | ICD-10-CM | POA: Diagnosis not present

## 2020-07-18 LAB — BASIC METABOLIC PANEL
Anion gap: 9 (ref 5–15)
BUN: 14 mg/dL (ref 8–23)
CO2: 21 mmol/L — ABNORMAL LOW (ref 22–32)
Calcium: 8.7 mg/dL — ABNORMAL LOW (ref 8.9–10.3)
Chloride: 109 mmol/L (ref 98–111)
Creatinine, Ser: 0.87 mg/dL (ref 0.61–1.24)
GFR calc Af Amer: >60 mL/min (ref >60)
GFR calc non Af Amer: >60 mL/min (ref >60)
Glucose, Bld: 111 mg/dL — ABNORMAL HIGH (ref 70–99)
Potassium: 4 mmol/L (ref 3.5–5.1)
Sodium: 139 mmol/L (ref 135–145)

## 2020-07-18 LAB — CBC WITH DIFFERENTIAL/PLATELET
Abs Immature Granulocytes: 0.03 10*3/uL (ref 0.00–0.07)
Basophils Absolute: 0 10*3/uL (ref 0.0–0.1)
Basophils Relative: 0 %
Eosinophils Absolute: 0.2 10*3/uL (ref 0.0–0.5)
Eosinophils Relative: 3 %
HCT: 28.4 % — ABNORMAL LOW (ref 39.0–52.0)
Hemoglobin: 9.7 g/dL — ABNORMAL LOW (ref 13.0–17.0)
Immature Granulocytes: 0 %
Lymphocytes Relative: 20 %
Lymphs Abs: 1.5 10*3/uL (ref 0.7–4.0)
MCH: 31.4 pg (ref 26.0–34.0)
MCHC: 34.2 g/dL (ref 30.0–36.0)
MCV: 91.9 fL (ref 80.0–100.0)
Monocytes Absolute: 0.5 10*3/uL (ref 0.1–1.0)
Monocytes Relative: 6 %
Neutro Abs: 5.3 10*3/uL (ref 1.7–7.7)
Neutrophils Relative %: 71 %
Platelets: 208 10*3/uL (ref 150–400)
RBC: 3.09 MIL/uL — ABNORMAL LOW (ref 4.22–5.81)
RDW: 12.9 % (ref 11.5–15.5)
WBC: 7.5 10*3/uL (ref 4.0–10.5)
nRBC: 0 % (ref 0.0–0.2)

## 2020-07-18 MED ORDER — DOCUSATE SODIUM 100 MG PO CAPS
100.0000 mg | ORAL_CAPSULE | Freq: Two times a day (BID) | ORAL | 0 refills | Status: DC
Start: 2020-07-18 — End: 2020-10-30

## 2020-07-18 NOTE — Progress Notes (Signed)
[facility] Progress Note  David Goodman 79 y.o. [birth year]  CC:  Rectal bleeding  Subjective: Patient denies any episodes of rectal bleeding today.  Had a small BM yesterday but none today.  Denies any abdominal pain, chest pain, or shortness of breath.  ROS : Review of Systems  Respiratory: Negative for cough and shortness of breath.   Cardiovascular: Negative for chest pain and palpitations.  Gastrointestinal: Negative for abdominal pain, blood in stool, constipation, diarrhea, heartburn, melena, nausea and vomiting.    Objective: Vital signs in last 24 hours: Vitals:   07/17/20 2037 07/18/20 0501  BP: 138/78 (!) 155/75  Pulse: 77 75  Resp: 19 19  Temp: 98 F (36.7 C) (!) 97.5 F (36.4 C)  SpO2: 99% 98%    Physical Exam:  General:  Alert, oriented, cooperative, no acute distress  Head:  Normocephalic, without obvious abnormality, atraumatic  Eyes:  Mild conjunctival pallor, EOM's intact,   Lungs:   Clear to auscultation bilaterally, respirations unlabored  Heart:  Regular rate and rhythm, S1/S2 normal  Abdomen:   Soft, non-tender, non-distended, bowel sounds active all four quadrants,  no guarding or peritoneal signs.  Extremities: Extremities normal, atraumatic, no  edema  Pulses: 2+ and symmetric    Lab Results: Recent Labs    07/17/20 0212 07/18/20 0418  NA 138 139  K 4.0 4.0  CL 113* 109  CO2 20* 21*  GLUCOSE 111* 111*  BUN 17 14  CREATININE 0.82 0.87  CALCIUM 7.7* 8.7*   Recent Labs    07/16/20 1123  AST 17  ALT 19  ALKPHOS 40  BILITOT 0.8  PROT 5.1*  ALBUMIN 3.3*   Recent Labs    07/16/20 1123 07/16/20 1551 07/17/20 0212 07/17/20 0825 07/17/20 2042 07/18/20 0418  WBC 12.3*   < > 7.2  --   --  7.5  NEUTROABS 10.7*  --   --   --   --  5.3  HGB 10.5*   < > 8.9*   < > 9.2* 9.7*  HCT 30.2*   < > 25.3*   < > 26.2* 28.4*  MCV 91.5   < > 93.4  --   --  91.9  PLT 193   < > 181  --   --  208   < > = values in this interval not  displayed.   Recent Labs    07/16/20 1551  LABPROT 13.9  INR 1.1      Assessment: Painless hematochezia, most consistent with diverticular bleeding.   -Bleeding appears to have stopped, Hgb gradually improving, now 9.7 as compared to 8.9 yesterday -Hemodynamically stable: regular HR with normotensive to mildly increased BP  Plan: OK to discharge from GI standpoint.   Recommend high fiber diet; metamucil daily to BID.  Repeat CBC in 1-2 weeks with [facility].  [facility] will sign off. Please contact us if we can be of any further assistance during this hospital stay.  Salley Slaughter PA-C 07/18/2020, 10:03 AM  Contact #  [phone]

## 2020-07-18 NOTE — Care Management CC44 (Signed)
Condition Code 44 Documentation Completed  Patient Details  Name: David Goodman MRN: [id number] Date of Birth: 26-Aug-1941   Condition Code 44 given:  Yes Patient signature on Condition Code 44 notice:  Yes Documentation of 2 MD's agreement:  Yes Code 44 added to claim:  Yes    Ross Ludwig, LCSW 07/18/2020, 12:11 PM

## 2020-07-18 NOTE — Progress Notes (Addendum)
   07/18/20 0501  Vitals  Temp (!) 97.5 F (36.4 C)  Temp Source Oral  BP (!) 155/75  MAP (mmHg) 96  BP Location Left Arm  BP Method Automatic  Patient Position (if appropriate) Lying  Pulse Rate 75  Pulse Rate Source Monitor  Resp 19  MEWS COLOR  MEWS Score Color Green  Oxygen Therapy  SpO2 98 %  O2 Device Room Air  MEWS Score  MEWS Temp 0  MEWS Systolic 0  MEWS Pulse 0  MEWS RR 0  MEWS LOC 0  MEWS Score 0  Pt. Had 9 beats of V-Tach. PCP was notified.

## 2020-07-18 NOTE — Care Management Obs Status (Signed)
[place] NOTIFICATION   Patient Details  Name: David Goodman MRN: [id number] Date of Birth: 1941/01/10   Medicare Observation Status Notification Given:  Yes    Ross Ludwig, LCSW 07/18/2020, 12:11 PM

## 2020-07-18 NOTE — Care Management Important Message (Signed)
Important Message  Patient Details IM Letter given to the Patient Name: David Goodman MRN: [id number] Date of Birth: [birth year]   Medicare Important Message Given:  Yes     [name] 07/18/2020, 10:41 AM

## 2020-07-18 NOTE — Progress Notes (Signed)
PT discharging home. IVs removed WNL. Reviewed AVS and medications and instructed on follow up appointments. Stressed importance of discontinuing NSAIDs to prevent further bleeding. Pt verbalizes understanding, waiting on wife to be picked up. Pt NAD.

## 2020-07-18 NOTE — Discharge Summary (Signed)
Physician Discharge Summary  David Goodman YTK:[id number] DOB: 11-May-1941 DOA: 07/16/2020  PCP: Isaac Bliss, Rayford Halsted, MD  Admit date: 07/16/2020 Discharge date: 07/18/2020  Recommendations for Outpatient Follow-up:  1. Discharge to home 2. Follow up with PCP in 7-10 days. 3. Follow up with Gastroenterology in 1-2 weeks. 4. Have CBC drawn on that visit. 5. Stop NSAIDS  Discharge Diagnoses: Principal diagnosis is #1 1. Lower GI Bleed/Hematochezia 2. Acute blood loss anemia 3. Presyncope due to anemia 4. Leukocytosis 5. Hypertension  Discharge Condition: Fair  Disposition: Home  Diet recommendation: Heart healthy  Filed Weights   07/16/20 2004  Weight: 83 kg    History of present illness:  David Goodman is a 79 y.o. male with history of HTN, PUD, diverticulosis, BPH and osteoarthritis presenting with rectal bleeding.  Patient woke up earlier this morning and had 2 "normal" bowel movements.  He went to the bathroom for the third time and noted fresh red blood in his stool.  He had two more bloody bowel movement after that. He then went to see his orthopedic surgeon and had steroid injection to bilateral knees.  Patient felt lightheaded when he went to work, and asked coworkers to call EMS and he was brought to ED.  He denies passing out or falling.  He denies chest pain, palpitation, shortness of breath, fever, chills, nausea, vomiting, abdominal pain or UTI symptoms.  Patient is fully vaccinated against COVID-19.  Of note, he has been taking 3 tablets of ibuprofen twice a day for the last 3 days for his knee pain.  He is not on blood thinner.  Patient lives with his wife.  Denies smoking cigarettes.  Admits to occasional alcohol.  Denies recreational drug use.  In ED, slightly soft blood pressures.  Not tachycardic.  Glucose 168.  BUN 23.  WBC 12.3.  Hgb 10.5 (13.9 about 4 months ago).  He was typed and screened.  [facility] consulted and hospital service confirmation.   COVID-19 PCR was negative.  Hospital Course:  [facility] have admitted the patient to a telemetry bed. Dr. Therisa Doyne has seen the patient. She has recommended monitoring the patient and repeating a bleeding scan should he begin to bleed briskly again. The patient's hemoglobin has been stable and has even increased over the last 24 hours.   Today's assessment: S: The patient is resting comfortably. No new complaints. O: Vitals:  Vitals:   07/17/20 2037 07/18/20 0501  BP: 138/78 (!) 155/75  Pulse: 77 75  Resp: 19 19  Temp: 98 F (36.7 C) (!) 97.5 F (36.4 C)  SpO2: 99% 98%   Exam:  Constitutional:  . The patient is awake, alert, and oriented x 3. No acute distress. Respiratory:  . No increased work of breathing. . No wheezes, rales, or rhonchi . No tactile fremitus Cardiovascular:  . Regular rate and rhythm . No murmurs, ectopy, or gallups. . No lateral PMI. No thrills. Abdomen:  . Abdomen is soft, non-tender, non-distended . No hernias, masses, or organomegaly . Normoactive bowel sounds.  Musculoskeletal:  . No cyanosis, clubbing, or edema Skin:  . No rashes, lesions, ulcers . palpation of skin: no induration or nodules Neurologic:  . CN 2-12 intact . Sensation all 4 extremities intact Psychiatric:  . Mental status o Mood, affect appropriate o Orientation to person, place, time  . judgment and insight appear intact   Discharge Instructions  Discharge Instructions    Activity as tolerated - No restrictions   Complete by: As  directed    Call MD for:  difficulty breathing, headache or visual disturbances   Complete by: As directed    Call MD for:  extreme fatigue   Complete by: As directed    Call MD for:  persistant dizziness or light-headedness   Complete by: As directed    Call MD for:  persistant nausea and vomiting   Complete by: As directed    Diet - low sodium heart healthy   Complete by: As directed    Discharge instructions   Complete by: As  directed    Discharge to home Follow up with PCP in 7-10 days. Follow up with Gastroenterology in 1-2 weeks. Have CBC drawn on that visit.   Increase activity slowly   Complete by: As directed      Allergies as of 07/18/2020      Reactions   Lisinopril Cough      Medication List    STOP taking these medications   ibuprofen 200 MG tablet Commonly known as: ADVIL   naproxen 500 MG tablet Commonly known as: NAPROSYN   Testosterone Cypionate 100 MG/ML Soln     TAKE these medications   AeroChamber Mini Chamber Devi Use with inhaler   Breo Ellipta 100-25 MCG/INH Aepb Generic drug: fluticasone furoate-vilanterol Inhale 1 puff into the lungs daily.   clonazePAM 0.5 MG tablet Commonly known as: KLONOPIN Take 0.5 tablets (0.25 mg total) by mouth at bedtime as needed for anxiety.   docusate sodium 100 MG capsule Commonly known as: COLACE Take 1 capsule (100 mg total) by mouth 2 (two) times daily.   fluticasone 50 MCG/ACT nasal spray Commonly known as: FLONASE Place 2 sprays into both nostrils daily.   HYDROcodone-homatropine 5-1.5 MG/5ML syrup Commonly known as: HYCODAN Take 5 mLs by mouth every 8 (eight) hours as needed for cough.   latanoprost 0.005 % ophthalmic solution Commonly known as: XALATAN Place 1 drop into both eyes at bedtime.   levocetirizine 5 MG tablet Commonly known as: XYZAL TAKE 1 TABLET BY MOUTH  EVERY EVENING   losartan 100 MG tablet Commonly known as: COZAAR Take 1 tablet (100 mg total) by mouth daily.   MAGNESIUM PO Take 1 tablet by mouth daily.   ondansetron 4 MG tablet Commonly known as: Zofran Take 1 tablet (4 mg total) by mouth every 8 (eight) hours as needed for nausea or vomiting.   pantoprazole 40 MG tablet Commonly known as: PROTONIX Take 1 tablet (40 mg total) by mouth daily.   potassium chloride SA 20 MEQ tablet Commonly known as: KLOR-CON Take 1 tablet (20 mEq total) by mouth 2 (two) times daily for 3 days.   METAMUCIL  PO Take 1 packet by mouth daily.   psyllium 95 % Pack Commonly known as: HYDROCIL/METAMUCIL Take 1 packet by mouth 2 (two) times daily.   rOPINIRole 0.25 MG tablet Commonly known as: Requip Take 1 tablet (0.25 mg total) by mouth at bedtime.   tadalafil 20 MG tablet Commonly known as: Cialis Take 1 tablet (20 mg total) by mouth daily as needed for erectile dysfunction.   tamsulosin 0.4 MG Caps capsule Commonly known as: FLOMAX Take 1 capsule (0.4 mg total) by mouth daily.   Xiidra 5 % Soln Generic drug: Lifitegrast Place 1 drop into both eyes at bedtime.      Allergies  Allergen Reactions  . Lisinopril Cough    The results of significant diagnostics from this hospitalization (including imaging, microbiology, ancillary and laboratory) are listed below for  reference.    Significant Diagnostic Studies: DG Chest Port 1 View  Result Date: 07/16/2020 CLINICAL DATA:  Syncope EXAM: PORTABLE CHEST 1 VIEW COMPARISON:  January 01, 2020 FINDINGS: The lungs are clear. Heart size and pulmonary vascularity are normal. No adenopathy. No pneumothorax. Apparent previous trauma involving lateral left clavicle. IMPRESSION: Lungs clear.  Cardiac silhouette normal. Electronically Signed   By: Lowella Grip III M.D.   On: 07/16/2020 12:00   VAS [place] LE ART SEG MULTI (Segm&LE Reynauds)  Result Date: 07/06/2020 LOWER EXTREMITY DOPPLER STUDY High Risk Factors: Hypertension, hyperlipidemia, no history of smoking. Other Factors: Patient complains of bilateral leg cramps that are worse at                nighttime. He states that walking it off makes his legs feel                better. This has been ongoing for several years.  Performing Technologist: Wilkie Aye RVT  Examination Guidelines: A complete evaluation includes at minimum, Doppler waveform signals and systolic blood pressure reading at the level of bilateral brachial, anterior tibial, and posterior tibial arteries, when vessel segments are  accessible. Bilateral testing is considered an integral part of a complete examination. Photoelectric Plethysmograph (PPG) waveforms and toe systolic pressure readings are included as required and additional duplex testing as needed. Limited examinations for reoccurring indications may be performed as noted.  ABI Findings: +---------+------------------+-----+---------+--------+ Right    Rt Pressure (mmHg)IndexWaveform Comment  +---------+------------------+-----+---------+--------+ Brachial 150                                      +---------+------------------+-----+---------+--------+ CFA                             triphasic         +---------+------------------+-----+---------+--------+ Popliteal                       triphasic         +---------+------------------+-----+---------+--------+ ATA      178               1.15 triphasic         +---------+------------------+-----+---------+--------+ PTA      195               1.26 triphasic         +---------+------------------+-----+---------+--------+ PERO     189               1.22 triphasic         +---------+------------------+-----+---------+--------+ Great Toe177               1.14 Normal            +---------+------------------+-----+---------+--------+ +---------+------------------+-----+---------+-------+ Left     Lt Pressure (mmHg)IndexWaveform Comment +---------+------------------+-----+---------+-------+ Brachial 155                                     +---------+------------------+-----+---------+-------+ CFA                             triphasic        +---------+------------------+-----+---------+-------+ Popliteal  triphasic        +---------+------------------+-----+---------+-------+ ATA      165               1.06 triphasic        +---------+------------------+-----+---------+-------+ PTA      165               1.06 triphasic         +---------+------------------+-----+---------+-------+ PERO     164               1.06 triphasic        +---------+------------------+-----+---------+-------+ Great Toe169               1.09 Normal           +---------+------------------+-----+---------+-------+   Summary: Right: Resting right ankle-brachial index is within normal range. No evidence of significant right lower extremity arterial disease. The right toe-brachial index is normal. Left: Resting left ankle-brachial index is within normal range. No evidence of significant left lower extremity arterial disease. The left toe-brachial index is normal.  *See table(s) above for measurements and observations.  Electronically signed by Carlyle Dolly MD on 07/06/2020 at 2:31:24 PM.    Final     Microbiology: Recent Results (from the past 240 hour(s))  SARS Coronavirus 2 by RT PCR (hospital order, performed in [facility] hospital lab) Nasopharyngeal Nasopharyngeal Swab     Status: None   Collection Time: 07/16/20 12:17 PM   Specimen: Nasopharyngeal Swab  Result Value Ref Range Status   SARS Coronavirus 2 NEGATIVE NEGATIVE Final    Comment: (NOTE) SARS-CoV-2 target nucleic acids are NOT DETECTED.  The SARS-CoV-2 RNA is generally detectable in upper and lower respiratory specimens during the acute phase of infection. The lowest concentration of SARS-CoV-2 viral copies this assay can detect is 250 copies / mL. A negative result does not preclude SARS-CoV-2 infection and should not be used as the sole basis for treatment or other patient management decisions.  A negative result may occur with improper specimen collection / handling, submission of specimen other than nasopharyngeal swab, presence of viral mutation(s) within the areas targeted by this assay, and inadequate number of viral copies (<250 copies / mL). A negative result must be combined with clinical observations, patient history, and epidemiological information.  Fact  Sheet for Patients:   StrictlyIdeas.no  Fact Sheet for Healthcare Providers: BankingDealers.co.za  This test is not yet approved or  cleared by the [place] FDA and has been authorized for detection and/or diagnosis of SARS-CoV-2 by FDA under an Emergency Use Authorization (EUA).  This EUA will remain in effect (meaning this test can be used) for the duration of the COVID-19 declaration under Section 564(b)(1) of the Act, 21 U.S.C. section 360bbb-3(b)(1), unless the authorization is terminated or revoked sooner.  Performed at [facility], [place] [address], [place], [place] [address]      Labs: Basic Metabolic Panel: Recent Labs  Lab 07/16/20 1123 07/17/20 0212 07/18/20 0418  NA 137 138 139  K 3.9 4.0 4.0  CL 107 113* 109  CO2 20* 20* 21*  GLUCOSE 168* 111* 111*  BUN 23 17 14   CREATININE 1.02 0.82 0.87  CALCIUM 7.6* 7.7* 8.7*   Liver Function Tests: Recent Labs  Lab 07/16/20 1123  AST 17  ALT 19  ALKPHOS 40  BILITOT 0.8  PROT 5.1*  ALBUMIN 3.3*   No results for input(s): LIPASE, AMYLASE in the last 168 hours. No results for input(s): AMMONIA  in the last 168 hours. CBC: Recent Labs  Lab 07/16/20 1123 07/16/20 1123 07/16/20 1551 07/16/20 2049 07/17/20 0212 07/17/20 0825 07/17/20 1503 07/17/20 2042 07/18/20 0418  WBC 12.3*  --  12.4*  --  7.2  --   --   --  7.5  NEUTROABS 10.7*  --   --   --   --   --   --   --  5.3  HGB 10.5*   < > 10.6*   < > 8.9* 9.0* 9.0* 9.2* 9.7*  HCT 30.2*   < > 30.5*   < > 25.3* 26.2* 26.2* 26.2* 28.4*  MCV 91.5  --  91.6  --  93.4  --   --   --  91.9  PLT 193  --  208  --  181  --   --   --  208   < > = values in this interval not displayed.   Cardiac Enzymes: No results for input(s): CKTOTAL, CKMB, CKMBINDEX, TROPONINI in the last 168 hours. BNP: BNP (last 3 results) No results for input(s): BNP in the last 8760 hours.  ProBNP (last 3 results) No  results for input(s): PROBNP in the last 8760 hours.  CBG: Recent Labs  Lab 07/16/20 1222  GLUCAP 122*    Principal Problem:   Acute blood loss anemia Active Problems:   Essential hypertension   PUD (peptic ulcer disease)   BPH (benign prostatic hyperplasia)   GERD (gastroesophageal reflux disease)   Acute lower GI bleeding   Hemorrhoids   Lower GI bleed   Time coordinating discharge: 38 minutes.  Signed:        Cephas Revard, DO [facility]  07/18/2020, 3:33 PM

## 2020-07-18 NOTE — Discharge Instructions (Signed)
Gastrointestinal Bleeding Gastrointestinal (GI) bleeding is bleeding somewhere along the digestive tract, between the mouth and the anus. This tract includes the mouth, esophagus, stomach, small intestine, large intestine, and anus. The large intestine is often called the colon. GI bleeding can be caused by various problems. The severity of these problems can range from mild to serious or even life-threatening. If you have GI bleeding, you may find blood in your stools (feces), you may have black stools, or you may vomit blood. If there is a lot of bleeding, you may need to stay in the hospital. What are the causes? This condition may be caused by:  Inflammation, irritation, or swelling of the esophagus (esophagitis). The esophagus is part of the body that moves food from your mouth to your stomach.  Swollen veins in the rectum (hemorrhoids).  Areas of painful tearing in the anus that are often caused by passing hard stool (anal fissures).  Pouches that form on the colon over time, with age, and may bleed a lot (diverticulosis).  Inflammation (diverticulitis) in areas with diverticulosis. This can cause pain, fever, and bloody stools, although bleeding may be mild.  Growths (polyps) or cancer. Colon cancer often starts out as precancerous polyps.  Gastritis and ulcers. With these, bleeding may come from the upper GI tract, near the stomach. What increases the risk? You are more likely to develop this condition if you:  Have an infection in your stomach from a type of bacteria called Helicobacter pylori.  Take certain medicines, such as: ? NSAIDs. ? Aspirin. ? Selective serotonin reuptake inhibitors (SSRIs). ? Steroids. ? Antiplatelet or anticoagulant medicines.  Smoke.  Drink alcohol. What are the signs or symptoms? Common symptoms of this condition include:  Bright red blood in your vomit, or vomit that looks like coffee grounds.  Bloody, black, or tarry stools. ? Bleeding  from the lower GI tract will usually cause red or maroon blood in the stools. ? Bleeding from the upper GI tract may cause black, tarry stools that are often stronger smelling than usual. ? In certain cases, if the bleeding is fast enough, the stools may be red.  Pain or cramping in the abdomen. How is this diagnosed? This condition may be diagnosed based on:  Your medical history and a physical exam.  Various tests, such as: ? Blood tests. ? Stool tests. ? X-rays and other imaging tests. ? Esophagogastroduodenoscopy (EGD). In this test, a flexible, lighted tube is used to look at your esophagus, stomach, and small intestine. ? Colonoscopy. In this test, a flexible, lighted tube is used to look at your colon. How is this treated? Treatment for this condition depends on the cause of the bleeding. For example:  For bleeding from the esophagus, stomach, small intestine, or colon, the health care provider doing your EGD or colonoscopy may be able to stop the bleeding as part of the procedure.  Inflammation or infection of the colon can be treated with medicines.  Certain rectal problems can be treated with creams, suppositories, or warm baths.  Medicines may be given to reduce acid in your stomach.  Surgery is sometimes needed.  Blood transfusions are sometimes needed if a lot of blood has been lost. If bleeding is mild, you may be allowed to go home. If there is a lot of bleeding, you will need to stay in the hospital for observation. Follow these instructions at home:   Take over-the-counter and prescription medicines only as told by your health care provider.    Eat foods that are high in fiber, such as beans, whole grains, and fresh fruits and vegetables. This will help to keep your stools soft. Eating 1-3 prunes each day works well for many people.  Drink enough fluid to keep your urine pale yellow.  Keep all follow-up visits as told by your health care provider. This is  important. Contact a health care provider if:  Your symptoms do not improve. Get help right away if:  Your bleeding does not stop.  You feel light-headed or you faint.  You feel weak.  You have severe cramps in your back or abdomen.  You pass large blood clots in your stool.  Your symptoms are getting worse.  You have chest pain or fast heartbeats. Summary  Gastrointestinal (GI) bleeding is bleeding somewhere along the digestive tract, between the mouth and anus. GI bleeding can be caused by various problems. The severity of these problems can range from mild to serious or even life-threatening.  Treatment for this condition depends on the cause of the bleeding.  Take over-the-counter and prescription medicines only as told by your health care provider.  Keep all follow-up visits as told by your health care provider. This is important.  Get help right away if your bleeding increases, your symptoms are getting worse, or you have new symptoms. This information is not intended to replace advice given to you by your health care provider. Make sure you discuss any questions you have with your health care provider. Document Revised: 06/28/2018 Document Reviewed: 06/28/2018 Elsevier Patient Education  2020 [employer]  

## 2020-07-19 ENCOUNTER — Other Ambulatory Visit: Payer: Self-pay | Admitting: Internal Medicine

## 2020-07-19 DIAGNOSIS — I10 Essential (primary) hypertension: Secondary | ICD-10-CM

## 2020-07-23 ENCOUNTER — Other Ambulatory Visit: Payer: Self-pay

## 2020-07-23 ENCOUNTER — Ambulatory Visit (INDEPENDENT_AMBULATORY_CARE_PROVIDER_SITE_OTHER): Payer: Medicare Other | Admitting: Otolaryngology

## 2020-07-23 ENCOUNTER — Encounter (INDEPENDENT_AMBULATORY_CARE_PROVIDER_SITE_OTHER): Payer: Self-pay | Admitting: Otolaryngology

## 2020-07-23 VITALS — Temp 96.1°F

## 2020-07-23 DIAGNOSIS — H903 Sensorineural hearing loss, bilateral: Secondary | ICD-10-CM

## 2020-07-23 DIAGNOSIS — H6123 Impacted cerumen, bilateral: Secondary | ICD-10-CM

## 2020-07-23 DIAGNOSIS — R42 Dizziness and giddiness: Secondary | ICD-10-CM | POA: Diagnosis not present

## 2020-07-23 NOTE — Progress Notes (Signed)
HPI: David Goodman is a 79 y.o. male who presents is referred by his PCP for evaluation of dizziness and left ear problems. He used to see Dr Romeo Rabon next door and has had longstanding hearing loss and got hearing aids over 5 years ago. He has not followed up concerning his hearing aids and apparently had ordered the hearing aids online. He does not feel like he is hearing well and has discomfort in the left ear when he puts the hearing aid in. He has also had dizziness which is more of a chronic imbalance but no real spinning or vertigo. The imbalance occurs throughout the day..  Past Medical History:  Diagnosis Date  . ALLERGIC RHINITIS 07/13/2007  . HYPERLIPIDEMIA 07/13/2007  . HYPERTENSION 07/13/2007  . 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 PEPTIC ULCER DISEASE 07/13/2007  . 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 COLONOSCOPY WITH PROPOFOL N/A 02/17/2019   Procedure: COLONOSCOPY WITH PROPOFOL;  Surgeon: Ronnette Juniper, MD;  Location: [facility];  Service: Gastroenterology;  Laterality: N/A;  . IR ANGIOGRAM VISCERAL SELECTIVE  02/16/2019  . IR ANGIOGRAM VISCERAL SELECTIVE  02/16/2019  . IR ANGIOGRAM VISCERAL SELECTIVE  02/16/2019  . IR US GUIDE VASC ACCESS RIGHT  02/16/2019  . ROTATOR CUFF REPAIR     Social History   Socioeconomic History  . Marital status: Married    Spouse name: Not on file  . Number of children: Not on file  . Years of education: Not on file  . Highest education level: Not on file  Occupational History  . Not on file  Tobacco Use  . Smoking status: Never Smoker  . Smokeless tobacco: Never Used  Vaping Use  . Vaping Use: Never used  Substance and Sexual Activity  . Alcohol use: Yes    Comment: couple times a week - wine  . Drug use: No  . Sexual activity: Not Currently  Other Topics Concern  . Not on file  Social History Narrative  . Not on file   Social Determinants of Health   Financial Resource  Strain:   . Difficulty of Paying Living Expenses: Not on file  Food Insecurity:   . Worried About Charity fundraiser in the Last Year: Not on file  . Ran Out of Food in the Last Year: Not on file  Transportation Needs:   . Lack of Transportation (Medical): Not on file  . Lack of Transportation (Non-Medical): Not on file  Physical Activity:   . Days of Exercise per Week: Not on file  . Minutes of Exercise per Session: Not on file  Stress:   . Feeling of Stress : Not on file  Social Connections:   . Frequency of Communication with Friends and Family: Not on file  . Frequency of Social Gatherings with Friends and Family: Not on file  . Attends Religious Services: Not on file  . Active Member of Clubs or Organizations: Not on file  . Attends Archivist Meetings: Not on file  . Marital Status: Not on file   No family history on file. Allergies  Allergen Reactions  . Lisinopril Cough   Prior to Admission medications   Medication Sig Start Date End Date Taking? Authorizing Provider  clonazePAM (KLONOPIN) 0.5 MG tablet Take 0.5 tablets (0.25 mg total) by mouth at bedtime as needed for anxiety. 12/11/19  Yes Isaac Bliss, Rayford Halsted, MD  docusate sodium (COLACE) 100 MG capsule Take 1 capsule (100  mg total) by mouth 2 (two) times daily. 07/18/20  Yes Swayze, Ava, DO  fluticasone (FLONASE) 50 MCG/ACT nasal spray Place 2 sprays into both nostrils daily. 07/09/20  Yes Isaac Bliss, Rayford Halsted, MD  fluticasone furoate-vilanterol (BREO ELLIPTA) 100-25 MCG/INH AEPB Inhale 1 puff into the lungs daily. 02/06/20  Yes Candee Furbish, MD  HYDROcodone-homatropine [facility]) 5-1.5 MG/5ML syrup Take 5 mLs by mouth every 8 (eight) hours as needed for cough. 12/12/19  Yes Scot Jun, FNP  latanoprost (XALATAN) 0.005 % ophthalmic solution Place 1 drop into both eyes at bedtime. 06/20/17  Yes Marletta Lor, MD  levocetirizine (XYZAL) 5 MG tablet TAKE 1 TABLET BY MOUTH  EVERY EVENING  12/30/17  Yes Marletta Lor, MD  losartan (COZAAR) 100 MG tablet Take 1 tablet by mouth once daily 07/22/20  Yes Isaac Bliss, Rayford Halsted, MD  MAGNESIUM PO Take 1 tablet by mouth daily.   Yes [provider]  pantoprazole (PROTONIX) 40 MG tablet Take 1 tablet (40 mg total) by mouth daily. 02/18/19  Yes Gherghe, Vella Redhead, MD  psyllium (HYDROCIL/METAMUCIL) 95 % PACK Take 1 packet by mouth 2 (two) times daily. 02/18/19  Yes Gherghe, Vella Redhead, MD  Psyllium (METAMUCIL PO) Take 1 packet by mouth daily.    Yes [provider]  tadalafil (CIALIS) 20 MG tablet Take 1 tablet (20 mg total) by mouth daily as needed for erectile dysfunction. 03/06/20  Yes Isaac Bliss, Rayford Halsted, MD  tamsulosin (FLOMAX) 0.4 MG CAPS capsule Take 1 capsule (0.4 mg total) by mouth daily. 07/09/20  Yes Isaac Bliss, Rayford Halsted, MD  XIIDRA 5 % SOLN Place 1 drop into both eyes at bedtime.  07/14/20  Yes [provider]  ondansetron (ZOFRAN) 4 MG tablet Take 1 tablet (4 mg total) by mouth every 8 (eight) hours as needed for nausea or vomiting. Patient not taking: Reported on 07/16/2020 11/19/19   Eulas Post, MD  potassium chloride SA (K-DUR) 20 MEQ tablet Take 1 tablet (20 mEq total) by mouth 2 (two) times daily for 3 days. Patient not taking: Reported on 07/16/2020 07/20/19 06/05/20  Isaac Bliss, Rayford Halsted, MD  rOPINIRole (REQUIP) 0.25 MG tablet Take 1 tablet (0.25 mg total) by mouth at bedtime. Patient not taking: Reported on 07/16/2020 10/17/19   Isaac Bliss, Rayford Halsted, MD  Spacer/Aero-Holding Chambers (AEROCHAMBER MINI CHAMBER) [facility] Use with inhaler Patient not taking: Reported on 07/16/2020 12/12/19   Scot Jun, FNP     Positive ROS: Otherwise negative  All other systems have been reviewed and were otherwise negative with the exception of those mentioned in the HPI and as above.  Physical Exam: Constitutional: Alert, well-appearing, no acute distress Ears: External ears  without lesions or tenderness. He had minimal wax buildup in both ears that was cleaned with forceps and curette. Both TMs were clear with good mobility on pneumatic otoscopy. On hearing screening with the tuning forks he had bad hearing loss in both ears with AC > BC bilaterally. There is no evidence of ear canal infection or TM infection. Both middle ear spaces were clear. On Dix-Hallpike testing he had no evidence of BPPV. Nasal: External nose without lesions.. Clear nasal passages Oral: Lips and gums without lesions. Tongue and palate mucosa without lesions. Posterior oropharynx clear. Neck: No palpable adenopathy or masses Respiratory: Breathing comfortably  Skin: No facial/neck lesions or rash noted.  Cerumen impaction removal  Date/Time: 07/23/2020 9:25 AM Performed by: Rozetta Nunnery, MD Authorized by:  Rozetta Nunnery, MD   Consent:    Consent obtained:  Verbal   Consent given by:  Patient   Risks discussed:  Pain and bleeding Procedure details:    Location:  L ear and R ear   Procedure type: curette and forceps   Post-procedure details:    Inspection:  TM intact and canal normal   Hearing quality:  Improved   Patient tolerance of procedure:  Tolerated well, no immediate complications Comments:     TMs are clear bilaterally    Assessment: Bad hearing loss in both ears. Dizziness that does not appear to be related to any acute inner ear problem or middle ear problem.  Plan: Have recommended that he have repeat audiologic testing with [facility] as they should have his records. He will probably need new hearing aids. Concerning the dizziness I suspect this may be related to vestibular weakness and overall aging and would recommend physical therapy if symptoms do not improve.   Radene Journey, MD   CC:

## 2020-07-27 ENCOUNTER — Other Ambulatory Visit: Payer: Self-pay | Admitting: Internal Medicine

## 2020-07-28 ENCOUNTER — Telehealth: Payer: Self-pay | Admitting: Internal Medicine

## 2020-07-28 NOTE — Telephone Encounter (Signed)
error 

## 2020-07-29 ENCOUNTER — Other Ambulatory Visit: Payer: Self-pay

## 2020-07-30 ENCOUNTER — Encounter: Payer: Self-pay | Admitting: Internal Medicine

## 2020-07-30 ENCOUNTER — Ambulatory Visit (INDEPENDENT_AMBULATORY_CARE_PROVIDER_SITE_OTHER): Payer: Medicare Other | Admitting: Internal Medicine

## 2020-07-30 VITALS — BP 130/80 | HR 66 | Temp 98.4°F | Wt 177.2 lb

## 2020-07-30 DIAGNOSIS — K922 Gastrointestinal hemorrhage, unspecified: Secondary | ICD-10-CM | POA: Diagnosis not present

## 2020-07-30 DIAGNOSIS — K5792 Diverticulitis of intestine, part unspecified, without perforation or abscess without bleeding: Secondary | ICD-10-CM | POA: Diagnosis not present

## 2020-07-30 DIAGNOSIS — D62 Acute posthemorrhagic anemia: Secondary | ICD-10-CM

## 2020-07-30 DIAGNOSIS — Z09 Encounter for follow-up examination after completed treatment for conditions other than malignant neoplasm: Secondary | ICD-10-CM

## 2020-07-30 DIAGNOSIS — R6889 Other general symptoms and signs: Secondary | ICD-10-CM | POA: Diagnosis not present

## 2020-07-30 NOTE — Patient Instructions (Signed)
-  Nice seeing you today!!  -Lab work today; will notify you once results are available.  -Return in November for your physical as scheduled.

## 2020-07-30 NOTE — Progress Notes (Signed)
Established Patient Office Visit     This visit occurred during the SARS-CoV-2 public health emergency.  Safety protocols were in place, including screening questions prior to the visit, additional usage of staff PPE, and extensive cleaning of exam room while observing appropriate contact time as indicated for disinfecting solutions.    CC/Reason for Visit: Hospital follow-up  HPI: Diyan Dave is a 79 y.o. male who is coming in today for the above mentioned reasons. 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   She was hospitalized from 07/16/2020-07/18/2020 due to a syncopal event due to diverticular bleeding.  He states the morning of the 18th he had hematochezia, went to get cortisone injections for his knees at his orthopedist office, while at the waiting room had 2 more episodes of hematochezia.  He went to work where he felt dizzy and asked a coworker to call EMS and then passed out.  He was admitted to the hospital, he was not transfused, observation was recommended, GI was on board.  Colonoscopy was not done.  His discharge hemoglobin was 9.7.  He has had no further episodes of hematochezia since discharge.  He continues to feel fatigued and lightheaded.   Past Medical/Surgical History: Past Medical History:  Diagnosis Date  . ALLERGIC RHINITIS 07/13/2007  . HYPERLIPIDEMIA 07/13/2007  . HYPERTENSION 07/13/2007  . 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 PEPTIC ULCER DISEASE 07/13/2007  . 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 COLONOSCOPY WITH PROPOFOL N/A 02/17/2019   Procedure: COLONOSCOPY WITH PROPOFOL;  Surgeon: Ronnette Juniper, MD;  Location: [facility];  Service: Gastroenterology;  Laterality: N/A;  . IR ANGIOGRAM VISCERAL SELECTIVE  02/16/2019  . IR ANGIOGRAM VISCERAL SELECTIVE  02/16/2019  . IR ANGIOGRAM  VISCERAL SELECTIVE  02/16/2019  . IR US GUIDE VASC ACCESS RIGHT  02/16/2019  . ROTATOR CUFF REPAIR      Social History:  reports that he has never smoked. He has never used smokeless tobacco. He reports current alcohol use. He reports that he does not use drugs.  Allergies: Allergies  Allergen Reactions  . Lisinopril Cough    Family History:  No history of heart disease, cancer, stroke that he is aware of  Current Outpatient Medications:  .  clonazePAM (KLONOPIN) 0.5 MG tablet, Take 0.5 tablets (0.25 mg total) by mouth at bedtime as needed for anxiety., Disp: 20 tablet, Rfl: 1 .  docusate sodium (COLACE) 100 MG capsule, Take 1 capsule (100 mg total) by mouth 2 (two) times daily., Disp: 10 capsule, Rfl: 0 .  fluticasone (FLONASE) 50 MCG/ACT nasal spray, Place 2 sprays into both nostrils daily., Disp: 16 g, Rfl: 6 .  fluticasone furoate-vilanterol (BREO ELLIPTA) 100-25 MCG/INH AEPB, Inhale 1 puff into the lungs daily., Disp: 30 each, Rfl: 3 .  latanoprost (XALATAN) 0.005 % ophthalmic solution, Place 1 drop into both eyes at bedtime., Disp: 7.5 mL, Rfl: 1 .  levocetirizine (XYZAL) 5 MG tablet, TAKE 1 TABLET BY MOUTH  EVERY EVENING, Disp: 90 tablet, Rfl: 2 .  losartan (COZAAR) 100 MG tablet, Take 1 tablet by mouth once daily, Disp: 90 tablet, Rfl: 0 .  MAGNESIUM PO, Take 1 tablet by mouth daily., Disp: , Rfl:  .  omeprazole (PRILOSEC) 20 MG capsule, TAKE 1 CAPSULE BY MOUTH  DAILY, Disp: 90 capsule, Rfl: 1 .  psyllium (HYDROCIL/METAMUCIL) 95 % PACK, Take 1 packet by mouth 2 (two)  times daily., Disp: 60 each, Rfl: 1 .  Psyllium (METAMUCIL PO), Take 1 packet by mouth daily. , Disp: , Rfl:  .  tadalafil (CIALIS) 20 MG tablet, Take 1 tablet (20 mg total) by mouth daily as needed for erectile dysfunction., Disp: 30 tablet, Rfl: 2 .  tamsulosin (FLOMAX) 0.4 MG CAPS capsule, Take 1 capsule (0.4 mg total) by mouth daily., Disp: 90 capsule, Rfl: 1 .  XIIDRA 5 % SOLN, Place 1 drop into both eyes at  bedtime. , Disp: , Rfl:   Current Facility-Administered Medications:  .  albuterol (VENTOLIN HFA) 108 (90 Base) MCG/ACT inhaler 2 puff, 2 puff, Inhalation, Q4H PRN, Scot Jun, FNP  Review of Systems:  Constitutional: Denies fever, chills, diaphoresis, appetite change. HEENT: Denies photophobia, eye pain, redness, hearing loss, ear pain, congestion, sore throat, rhinorrhea, sneezing, mouth sores, trouble swallowing, neck pain, neck stiffness and tinnitus.   Respiratory: Denies cough, chest tightness,  and wheezing.   Cardiovascular: Denies chest pain, palpitations and leg swelling.  Gastrointestinal: Denies nausea, vomiting, abdominal pain, diarrhea, constipation, blood in stool and abdominal distention.  Genitourinary: Denies dysuria, urgency, frequency, hematuria, flank pain and difficulty urinating.  Endocrine: Denies: hot or cold intolerance, sweats, changes in hair or nails, polyuria, polydipsia. Musculoskeletal: Denies myalgias, back pain, joint swelling, arthralgias and gait problem.  Skin: Denies pallor, rash and wound.  Neurological: Denies  seizures, syncope, weakness, numbness and headaches.  Hematological: Denies adenopathy. Easy bruising, personal or family bleeding history  Psychiatric/Behavioral: Denies suicidal ideation, mood changes, confusion, nervousness, sleep disturbance and agitation    Physical Exam: Vitals:   07/30/20 1342  BP: 130/80  Pulse: 66  Temp: 98.4 F (36.9 C)  TempSrc: Oral  SpO2: 96%  Weight: 177 lb 3.2 oz (80.4 kg)    Body mass index is 30.42 kg/m.   Constitutional: NAD, calm, comfortable Eyes: PERRL, lids and conjunctivae normal, pale conjunctiva ENMT: Mucous membranes are moist. Respiratory: clear to auscultation bilaterally, no wheezing, no crackles. Normal respiratory effort. No accessory muscle use.  Cardiovascular: Regular rate and rhythm, no murmurs / rubs / gallops. No extremity edema.  Neurologic: Grossly intact and  nonfocal Psychiatric: Normal judgment and insight. Alert and oriented x 3. Normal mood.    Impression and Plan:  Hospital discharge follow-up Lower GI bleed  Diverticulitis Acute blood loss anemia -Check hemoglobin today, discharge hemoglobin was 9.7. -Check iron studies and vitamin B12 per patient request. -See no need for follow-up with GI unless further issues arise. -He has his physical scheduled for later this year.    Patient Instructions  -Nice seeing you today!!  -Lab work today; will notify you once results are available.  -Return in November for your physical as scheduled.     Lelon Frohlich, MD [facility]

## 2020-07-31 LAB — CBC WITH DIFFERENTIAL/PLATELET
Absolute Monocytes: 580 cells/uL (ref 200–950)
Basophils Absolute: 48 cells/uL (ref 0–200)
Basophils Relative: 0.5 %
Eosinophils Absolute: 190 cells/uL (ref 15–500)
Eosinophils Relative: 2 %
HCT: 31.5 % — ABNORMAL LOW (ref 38.5–50.0)
Hemoglobin: 10.6 g/dL — ABNORMAL LOW (ref 13.2–17.1)
Lymphs Abs: 1786 cells/uL (ref 850–3900)
MCH: 30.6 pg (ref 27.0–33.0)
MCHC: 33.7 g/dL (ref 32.0–36.0)
MCV: 91 fL (ref 80.0–100.0)
MPV: 10 fL (ref 7.5–12.5)
Monocytes Relative: 6.1 %
Neutro Abs: 6897 cells/uL (ref 1500–7800)
Neutrophils Relative %: 72.6 %
Platelets: 350 10*3/uL (ref 140–400)
RBC: 3.46 10*6/uL — ABNORMAL LOW (ref 4.20–5.80)
RDW: 13.1 % (ref 11.0–15.0)
Total Lymphocyte: 18.8 %
WBC: 9.5 10*3/uL (ref 3.8–10.8)

## 2020-07-31 LAB — VITAMIN B12: Vitamin B-12: 856 pg/mL (ref 200–1100)

## 2020-07-31 LAB — IRON,TIBC AND FERRITIN PANEL
%SAT: 16 % (calc) — ABNORMAL LOW (ref 20–48)
Ferritin: 41 ng/mL (ref 24–380)
Iron: 56 ug/dL (ref 50–180)
TIBC: 361 mcg/dL (calc) (ref 250–425)

## 2020-08-28 ENCOUNTER — Other Ambulatory Visit: Payer: Self-pay

## 2020-08-28 ENCOUNTER — Ambulatory Visit (INDEPENDENT_AMBULATORY_CARE_PROVIDER_SITE_OTHER): Payer: Medicare Other | Admitting: Plastic Surgery

## 2020-08-28 ENCOUNTER — Encounter: Payer: Self-pay | Admitting: Plastic Surgery

## 2020-08-28 VITALS — BP 166/83 | HR 75 | Temp 98.2°F | Ht 65.0 in | Wt 180.0 lb

## 2020-08-28 DIAGNOSIS — H02834 Dermatochalasis of left upper eyelid: Secondary | ICD-10-CM

## 2020-08-28 DIAGNOSIS — H02831 Dermatochalasis of right upper eyelid: Secondary | ICD-10-CM | POA: Diagnosis not present

## 2020-08-28 NOTE — Progress Notes (Signed)
Referring Provider Isaac Bliss, Rayford Halsted, MD [place],  [place] [id number]   CC:  Chief Complaint  Patient presents with  . Consult      David Goodman is an 79 y.o. male.  HPI: Patient presents to discuss his upper eyelids.  He has noticed excess upper eyelid skin obstructing his superior visual field.  This has been gradually getting worse with time.  He has had a cataract procedure on the left eye and has intermittent dry eye symptoms on that I will controlled with eyedrops.  He wears a contact in the right eye.  He has no other eye surgeries or trauma.  Allergies  Allergen Reactions  . Lisinopril Cough    Outpatient Encounter Medications as of 08/28/2020  Medication Sig  . docusate sodium (COLACE) 100 MG capsule Take 1 capsule (100 mg total) by mouth 2 (two) times daily.  . fluticasone furoate-vilanterol (BREO ELLIPTA) 100-25 MCG/INH AEPB Inhale 1 puff into the lungs daily.  Marland Kitchen latanoprost (XALATAN) 0.005 % ophthalmic solution Place 1 drop into both eyes at bedtime.  Marland Kitchen losartan (COZAAR) 100 MG tablet Take 1 tablet by mouth once daily  . MAGNESIUM PO Take 1 tablet by mouth daily.  Marland Kitchen omeprazole (PRILOSEC) 20 MG capsule TAKE 1 CAPSULE BY MOUTH  DAILY  . Psyllium (METAMUCIL PO) Take 1 packet by mouth daily.   . tamsulosin (FLOMAX) 0.4 MG CAPS capsule Take 1 capsule (0.4 mg total) by mouth daily.  Marland Kitchen XIIDRA 5 % SOLN Place 1 drop into both eyes at bedtime.   . clonazePAM (KLONOPIN) 0.5 MG tablet Take 0.5 tablets (0.25 mg total) by mouth at bedtime as needed for anxiety. (Patient not taking: Reported on 08/28/2020)  . fluticasone (FLONASE) 50 MCG/ACT nasal spray Place 2 sprays into both nostrils daily. (Patient not taking: Reported on 08/28/2020)  . levocetirizine (XYZAL) 5 MG tablet TAKE 1 TABLET BY MOUTH  EVERY EVENING (Patient not taking: Reported on 08/28/2020)  . psyllium (HYDROCIL/METAMUCIL) 95 % PACK Take 1 packet by mouth 2 (two) times daily.  . tadalafil  (CIALIS) 20 MG tablet Take 1 tablet (20 mg total) by mouth daily as needed for erectile dysfunction. (Patient not taking: Reported on 08/28/2020)   Facility-Administered Encounter Medications as of 08/28/2020  Medication  . albuterol (VENTOLIN HFA) 108 (90 Base) MCG/ACT inhaler 2 puff     Past Medical History:  Diagnosis Date  . ALLERGIC RHINITIS 07/13/2007  . HYPERLIPIDEMIA 07/13/2007  . HYPERTENSION 07/13/2007  . 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 PEPTIC ULCER DISEASE 07/13/2007  . 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 COLONOSCOPY WITH PROPOFOL N/A 02/17/2019   Procedure: COLONOSCOPY WITH PROPOFOL;  Surgeon: Ronnette Juniper, MD;  Location: [facility];  Service: Gastroenterology;  Laterality: N/A;  . IR ANGIOGRAM VISCERAL SELECTIVE  02/16/2019  . IR ANGIOGRAM VISCERAL SELECTIVE  02/16/2019  . IR ANGIOGRAM VISCERAL SELECTIVE  02/16/2019  . IR US GUIDE VASC ACCESS RIGHT  02/16/2019  . ROTATOR CUFF REPAIR      No family history on file.  Social History   Social History Narrative  . Not on file  Denies tobacco use  Review of Systems General: Denies fevers, chills, weight loss CV: Denies chest pain, shortness of breath, palpitations  Physical Exam Vitals with BMI 08/28/2020 07/30/2020 07/18/2020  Height 5\' 5"  - -  Weight 180 lbs 177 lbs 3 oz -  BMI 54.65 68.1 -  Systolic 275 170  212  Diastolic 83 80 75  Pulse 75 66 75    General:  No acute distress,  Alert and oriented, Non-Toxic, Normal speech and affect Examination shows significant dermatochalasis with skin resting on the eyelashes.  His brow is in a good position.  His eyelid margins are in good position and are symmetric.  He has appropriate levator function.  His MRD one is about 3 to 4 mm.  He has more excess skin on the left side compared to the right.  His lower lid is in reasonably good position in terms of the margin.  He has a prominent tear trough.  Otherwise  cranial nerves appear well intact.  He has a prominent medial fat pad on both sides in regards to the upper lids.  Assessment/Plan Patient is a good candidate for bilateral upper lid blepharoplasty.  We discussed the risks include bleeding, infection, damage to surrounding structures and need for additional procedures.  I discussed the location and orientation of the scar.  I discussed the expected postoperative recovery which included fairly significant bruising and swelling that would be present for several weeks.  I believe that this would significantly improve his visual field.  We discussed the potential worsening of his dry eye symptoms but I think the chances for this are quite low.  We will plan to obtain a visual field study and submit to insurance for approval.  [name] 08/28/2020, 9:48 AM

## 2020-09-05 DIAGNOSIS — H02831 Dermatochalasis of right upper eyelid: Secondary | ICD-10-CM | POA: Diagnosis not present

## 2020-09-05 DIAGNOSIS — H02834 Dermatochalasis of left upper eyelid: Secondary | ICD-10-CM | POA: Diagnosis not present

## 2020-09-08 ENCOUNTER — Telehealth: Payer: Self-pay | Admitting: Pulmonary Disease

## 2020-09-08 NOTE — Telephone Encounter (Signed)
I just spoke with the patient and he stated that he wants the Ct done after 11/15 at 8:00am I will call in the morning and get his CT scheduled for him. It probably threw him off because [facility] was calling him to schedule the CT

## 2020-09-09 NOTE — Telephone Encounter (Signed)
I have the patients CT scheduled for 10/14/2020 @ 8:00am and he is aware of the appt and location with [place]

## 2020-10-09 ENCOUNTER — Encounter: Payer: Medicare Other | Admitting: Internal Medicine

## 2020-10-14 ENCOUNTER — Ambulatory Visit
Admission: RE | Admit: 2020-10-14 | Discharge: 2020-10-14 | Disposition: A | Discharge status: Home / Self Care | Payer: Medicare Other | Source: Ambulatory Visit | Attending: Pulmonary Disease | Admitting: Pulmonary Disease

## 2020-10-14 DIAGNOSIS — J84112 Idiopathic pulmonary fibrosis: Secondary | ICD-10-CM | POA: Diagnosis not present

## 2020-10-14 DIAGNOSIS — I2510 Atherosclerotic heart disease of native coronary artery without angina pectoris: Secondary | ICD-10-CM | POA: Diagnosis not present

## 2020-10-14 DIAGNOSIS — J479 Bronchiectasis, uncomplicated: Secondary | ICD-10-CM | POA: Diagnosis not present

## 2020-10-14 DIAGNOSIS — M47814 Spondylosis without myelopathy or radiculopathy, thoracic region: Secondary | ICD-10-CM | POA: Diagnosis not present

## 2020-10-14 DIAGNOSIS — J849 Interstitial pulmonary disease, unspecified: Secondary | ICD-10-CM

## 2020-10-18 ENCOUNTER — Other Ambulatory Visit: Payer: Self-pay | Admitting: Internal Medicine

## 2020-10-18 DIAGNOSIS — I10 Essential (primary) hypertension: Secondary | ICD-10-CM

## 2020-10-21 ENCOUNTER — Telehealth: Payer: Self-pay | Admitting: Internal Medicine

## 2020-10-21 NOTE — Telephone Encounter (Signed)
Left message on machine for patient to return our call.  Patient should call the ordering doctor for results:  Marshell Garfinkel, MD

## 2020-10-21 NOTE — Telephone Encounter (Signed)
Patient is calling and wanted to see if his xray results were back yet, please advise. CB is [phone]

## 2020-10-26 NOTE — Progress Notes (Signed)
ICD-10-CM   1. Dermatochalasis of both upper eyelids  H02.831    H02.834       [name], male    DOB: 1941/06/15, 79 y.o.   MRN: [id number]   History of Present Illness: David Goodman is a 79 y.o.  male  with a history of dermatochalasis of both upper eyelids.  He presents for preoperative evaluation for upcoming procedure, bilateral upper lid blepharoplasty, scheduled for 11/10/2020 with Dr. Claudia Desanctis.  Summary from previous visit: Patient has noticed excess upper eye skin obstructing his superior visual fields.  It has been gradually getting worse.  He had a cataract procedure on the left eye and has intermittent dry eye symptoms that are well controlled with eyedrops.  He wears a contact in the right eye.  Examination shows significant dermatochalasis with skin resting on the eyelashes.  His brow is in a good position.  His eyelid margins are in good position and are symmetric.  He has appropriate levator function.  His MRD 1 is about 3 to 4 mm.  He has more excess skin on the left side compared to the right.  His lower lid is in reasonably good position in terms of the margin.  He has a prominent tear trough.  Cranial nerves appear well intact.  He has prominent medial fat pads on both sides in regards to the upper lids.  PMH Significant for: HLD, HTN, Hx of nephrolithiasis, PUD  The patient has not had problems with anesthesia.   Past Medical History: Allergies: Allergies  Allergen Reactions  . Lisinopril Cough    Current Medications:  Current Outpatient Medications:  .  fluticasone (FLONASE) 50 MCG/ACT nasal spray, Place 2 sprays into both nostrils daily., Disp: 16 g, Rfl: 6 .  fluticasone furoate-vilanterol (BREO ELLIPTA) 100-25 MCG/INH AEPB, Inhale 1 puff into the lungs daily., Disp: 30 each, Rfl: 3 .  latanoprost (XALATAN) 0.005 % ophthalmic solution, Place 1 drop into both eyes at bedtime., Disp: 7.5 mL, Rfl: 1 .  losartan (COZAAR) 100 MG tablet, Take 1 tablet by  mouth once daily, Disp: 90 tablet, Rfl: 1 .  MAGNESIUM PO, Take 1 tablet by mouth daily., Disp: , Rfl:  .  omeprazole (PRILOSEC) 20 MG capsule, TAKE 1 CAPSULE BY MOUTH  DAILY, Disp: 90 capsule, Rfl: 1 .  Psyllium (METAMUCIL PO), Take 1 packet by mouth daily. , Disp: , Rfl:  .  tadalafil (CIALIS) 20 MG tablet, Take 1 tablet (20 mg total) by mouth daily as needed for erectile dysfunction., Disp: 30 tablet, Rfl: 2 .  tamsulosin (FLOMAX) 0.4 MG CAPS capsule, Take 1 capsule (0.4 mg total) by mouth daily., Disp: 90 capsule, Rfl: 1 .  XIIDRA 5 % SOLN, Place 1 drop into both eyes at bedtime. , Disp: , Rfl:   Current Facility-Administered Medications:  .  albuterol (VENTOLIN HFA) 108 (90 Base) MCG/ACT inhaler 2 puff, 2 puff, Inhalation, Q4H PRN, Scot Jun, FNP  Past Medical Problems: Past Medical History:  Diagnosis Date  . ALLERGIC RHINITIS 07/13/2007  . HYPERLIPIDEMIA 07/13/2007  . HYPERTENSION 07/13/2007  . 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 PEPTIC ULCER DISEASE 07/13/2007  . 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 COLONOSCOPY WITH PROPOFOL N/A 02/17/2019   Procedure: COLONOSCOPY WITH PROPOFOL;  Surgeon: Ronnette Juniper, MD;  Location: [facility];  Service: Gastroenterology;  Laterality: N/A;  . IR ANGIOGRAM VISCERAL SELECTIVE  02/16/2019  .  IR ANGIOGRAM VISCERAL SELECTIVE  02/16/2019  . IR ANGIOGRAM VISCERAL SELECTIVE  02/16/2019  . IR US GUIDE VASC ACCESS RIGHT  02/16/2019  . ROTATOR CUFF REPAIR      Social History: Social History   Socioeconomic History  . Marital status: Married    Spouse name: Not on file  . Number of children: Not on file  . Years of education: Not on file  . Highest education level: Not on file  Occupational History  . Not on file  Tobacco Use  . Smoking status: Never Smoker  . Smokeless tobacco: Never Used  Vaping Use  . Vaping Use: Never used  Substance and Sexual  Activity  . Alcohol use: Yes    Comment: couple times a week - wine  . Drug use: No  . Sexual activity: Not Currently  Other Topics Concern  . Not on file  Social History Narrative  . Not on file   Social Determinants of Health   Financial Resource Strain:   . Difficulty of Paying Living Expenses: Not on file  Food Insecurity:   . Worried About Charity fundraiser in the Last Year: Not on file  . Ran Out of Food in the Last Year: Not on file  Transportation Needs:   . Lack of Transportation (Medical): Not on file  . Lack of Transportation (Non-Medical): Not on file  Physical Activity:   . Days of Exercise per Week: Not on file  . Minutes of Exercise per Session: Not on file  Stress:   . Feeling of Stress : Not on file  Social Connections:   . Frequency of Communication with Friends and Family: Not on file  . Frequency of Social Gatherings with Friends and Family: Not on file  . Attends Religious Services: Not on file  . Active Member of Clubs or Organizations: Not on file  . Attends Archivist Meetings: Not on file  . Marital Status: Not on file  Intimate Partner Violence:   . Fear of Current or Ex-Partner: Not on file  . Emotionally Abused: Not on file  . Physically Abused: Not on file  . Sexually Abused: Not on file    Family History: History reviewed. No pertinent family history.  Review of Systems: Review of Systems  Constitutional: Negative for chills and fever.  HENT: Negative for congestion and sore throat.   Eyes:       Excess upper eyelid tissue  Respiratory: Negative for cough and shortness of breath.   Cardiovascular: Negative for chest pain and palpitations.  Gastrointestinal: Negative for abdominal pain, nausea and vomiting.  Skin: Negative for itching and rash.    Physical Exam: Vital Signs BP (!) 170/83 (BP Location: Left Arm, Patient Position: Sitting, Cuff Size: Large)   Pulse 65   Temp 98 F (36.7 C) (Oral)   Ht 5\' 5"  (1.651 m)    Wt 179 lb (81.2 kg)   SpO2 100%   BMI 29.79 kg/m  Physical Exam Constitutional:      General: He is not in acute distress.    Appearance: Normal appearance. He is normal weight. He is not ill-appearing.  HENT:     Head: Normocephalic and atraumatic.  Eyes:     Extraocular Movements: Extraocular movements intact.     Comments: Excess upper eyelid tissue of bilateral eyes.  Cardiovascular:     Rate and Rhythm: Normal rate and regular rhythm.  Pulmonary:     Effort: Pulmonary effort is normal.  Breath sounds: Normal breath sounds. No stridor. No wheezing, rhonchi or rales.  Abdominal:     General: Bowel sounds are normal.     Palpations: Abdomen is soft.  Musculoskeletal:        General: Normal range of motion.     Cervical back: Normal range of motion.  Skin:    General: Skin is warm and dry.  Neurological:     General: No focal deficit present.     Mental Status: He is alert and oriented to person, place, and time.  Psychiatric:        Mood and Affect: Mood normal.        Behavior: Behavior normal.        Thought Content: Thought content normal.        Judgment: Judgment normal.     Assessment/Plan:  Mr. Dy scheduled for bilateral upper lid blepharoplasty with Dr. Claudia Desanctis.  Risks, benefits, and alternatives of procedure discussed, questions answered and consent obtained.    Smoking Status: nonsmoker; Counseling Given? N/A  Caprini Score: High; Risk Factors include: 79 year old male, BMI > 25, and length of planned surgery. Recommendation for mechanical and pharmacological prophylaxis during surgery. Encourage early ambulation.   Pictures obtained: 08/28/2020  Post-op Rx sent to pharmacy: Norco, Zofran, Tylenol  Patient was provided with the General Surgical Risk consent document and Pain Medication Agreement prior to their appointment.  They had adequate time to read through the risk consent documents and Pain Medication Agreement. We also discussed them in person  together during this preop appointment. All of their questions were answered to their satisfaction.  Recommended calling if they have any further questions.  Risk consent form and Pain Medication Agreement to be scanned into patient's chart.  The risks that can be encountered with and after a blepharoplasty were discussed and include the following but no limited to these:  Asymmetry, dry eyes, lid lag, sensitivity to sun or bright light, difficulty closing your eyes, outward rolling of the eyelid, change in vision, fluid accumulation, firmness of the area, fat necrosis with death of fat tissue, bleeding, infection, delayed healing, anesthesia risks, skin sensation changes, injury to structures including nerves, blood vessels, and muscles which may be temporary or permanent, hair loss, allergies to tape, suture materials and glues, blood products, topical preparations or injected agents, skin and contour irregularities, skin discoloration and swelling, deep vein thrombosis, cardiac and pulmonary complications, pain, which may persist, persistent pain, recurrence, poor healing of the incision, possible need for revisional surgery or staged procedures. Thiere can also be persistent swelling, poor wound healing, rippling or loose skin, swelling. Any change in weight fluctuations can alter the outcome.  Electronically signed by: Threasa Heads, PA-C 10/30/2020 9:32 AM

## 2020-10-26 NOTE — H&P (View-Only) (Signed)
ICD-10-CM   1. Dermatochalasis of both upper eyelids  H02.831    H02.834       [name], male    DOB: Aug 16, 1941, 79 y.o.   MRN: [id number]   History of Present Illness: David Goodman is a 79 y.o.  male  with a history of dermatochalasis of both upper eyelids.  He presents for preoperative evaluation for upcoming procedure, bilateral upper lid blepharoplasty, scheduled for 11/10/2020 with Dr. Claudia Desanctis.  Summary from previous visit: Patient has noticed excess upper eye skin obstructing his superior visual fields.  It has been gradually getting worse.  He had a cataract procedure on the left eye and has intermittent dry eye symptoms that are well controlled with eyedrops.  He wears a contact in the right eye.  Examination shows significant dermatochalasis with skin resting on the eyelashes.  His brow is in a good position.  His eyelid margins are in good position and are symmetric.  He has appropriate levator function.  His MRD 1 is about 3 to 4 mm.  He has more excess skin on the left side compared to the right.  His lower lid is in reasonably good position in terms of the margin.  He has a prominent tear trough.  Cranial nerves appear well intact.  He has prominent medial fat pads on both sides in regards to the upper lids.  PMH Significant for: HLD, HTN, Hx of nephrolithiasis, PUD  The patient has not had problems with anesthesia.   Past Medical History: Allergies: Allergies  Allergen Reactions  . Lisinopril Cough    Current Medications:  Current Outpatient Medications:  .  fluticasone (FLONASE) 50 MCG/ACT nasal spray, Place 2 sprays into both nostrils daily., Disp: 16 g, Rfl: 6 .  fluticasone furoate-vilanterol (BREO ELLIPTA) 100-25 MCG/INH AEPB, Inhale 1 puff into the lungs daily., Disp: 30 each, Rfl: 3 .  latanoprost (XALATAN) 0.005 % ophthalmic solution, Place 1 drop into both eyes at bedtime., Disp: 7.5 mL, Rfl: 1 .  losartan (COZAAR) 100 MG tablet, Take 1 tablet by  mouth once daily, Disp: 90 tablet, Rfl: 1 .  MAGNESIUM PO, Take 1 tablet by mouth daily., Disp: , Rfl:  .  omeprazole (PRILOSEC) 20 MG capsule, TAKE 1 CAPSULE BY MOUTH  DAILY, Disp: 90 capsule, Rfl: 1 .  Psyllium (METAMUCIL PO), Take 1 packet by mouth daily. , Disp: , Rfl:  .  tadalafil (CIALIS) 20 MG tablet, Take 1 tablet (20 mg total) by mouth daily as needed for erectile dysfunction., Disp: 30 tablet, Rfl: 2 .  tamsulosin (FLOMAX) 0.4 MG CAPS capsule, Take 1 capsule (0.4 mg total) by mouth daily., Disp: 90 capsule, Rfl: 1 .  XIIDRA 5 % SOLN, Place 1 drop into both eyes at bedtime. , Disp: , Rfl:   Current Facility-Administered Medications:  .  albuterol (VENTOLIN HFA) 108 (90 Base) MCG/ACT inhaler 2 puff, 2 puff, Inhalation, Q4H PRN, Scot Jun, FNP  Past Medical Problems: Past Medical History:  Diagnosis Date  . ALLERGIC RHINITIS 07/13/2007  . HYPERLIPIDEMIA 07/13/2007  . HYPERTENSION 07/13/2007  . 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 PEPTIC ULCER DISEASE 07/13/2007  . 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 COLONOSCOPY WITH PROPOFOL N/A 02/17/2019   Procedure: COLONOSCOPY WITH PROPOFOL;  Surgeon: Ronnette Juniper, MD;  Location: [facility];  Service: Gastroenterology;  Laterality: N/A;  . IR ANGIOGRAM VISCERAL SELECTIVE  02/16/2019  .  IR ANGIOGRAM VISCERAL SELECTIVE  02/16/2019  . IR ANGIOGRAM VISCERAL SELECTIVE  02/16/2019  . IR US GUIDE VASC ACCESS RIGHT  02/16/2019  . ROTATOR CUFF REPAIR      Social History: Social History   Socioeconomic History  . Marital status: Married    Spouse name: Not on file  . Number of children: Not on file  . Years of education: Not on file  . Highest education level: Not on file  Occupational History  . Not on file  Tobacco Use  . Smoking status: Never Smoker  . Smokeless tobacco: Never Used  Vaping Use  . Vaping Use: Never used  Substance and Sexual  Activity  . Alcohol use: Yes    Comment: couple times a week - wine  . Drug use: No  . Sexual activity: Not Currently  Other Topics Concern  . Not on file  Social History Narrative  . Not on file   Social Determinants of Health   Financial Resource Strain:   . Difficulty of Paying Living Expenses: Not on file  Food Insecurity:   . Worried About Charity fundraiser in the Last Year: Not on file  . Ran Out of Food in the Last Year: Not on file  Transportation Needs:   . Lack of Transportation (Medical): Not on file  . Lack of Transportation (Non-Medical): Not on file  Physical Activity:   . Days of Exercise per Week: Not on file  . Minutes of Exercise per Session: Not on file  Stress:   . Feeling of Stress : Not on file  Social Connections:   . Frequency of Communication with Friends and Family: Not on file  . Frequency of Social Gatherings with Friends and Family: Not on file  . Attends Religious Services: Not on file  . Active Member of Clubs or Organizations: Not on file  . Attends Archivist Meetings: Not on file  . Marital Status: Not on file  Intimate Partner Violence:   . Fear of Current or Ex-Partner: Not on file  . Emotionally Abused: Not on file  . Physically Abused: Not on file  . Sexually Abused: Not on file    Family History: History reviewed. No pertinent family history.  Review of Systems: Review of Systems  Constitutional: Negative for chills and fever.  HENT: Negative for congestion and sore throat.   Eyes:       Excess upper eyelid tissue  Respiratory: Negative for cough and shortness of breath.   Cardiovascular: Negative for chest pain and palpitations.  Gastrointestinal: Negative for abdominal pain, nausea and vomiting.  Skin: Negative for itching and rash.    Physical Exam: Vital Signs BP (!) 170/83 (BP Location: Left Arm, Patient Position: Sitting, Cuff Size: Large)   Pulse 65   Temp 98 F (36.7 C) (Oral)   Ht 5\' 5"  (1.651 m)    Wt 179 lb (81.2 kg)   SpO2 100%   BMI 29.79 kg/m  Physical Exam Constitutional:      General: He is not in acute distress.    Appearance: Normal appearance. He is normal weight. He is not ill-appearing.  HENT:     Head: Normocephalic and atraumatic.  Eyes:     Extraocular Movements: Extraocular movements intact.     Comments: Excess upper eyelid tissue of bilateral eyes.  Cardiovascular:     Rate and Rhythm: Normal rate and regular rhythm.  Pulmonary:     Effort: Pulmonary effort is normal.  Breath sounds: Normal breath sounds. No stridor. No wheezing, rhonchi or rales.  Abdominal:     General: Bowel sounds are normal.     Palpations: Abdomen is soft.  Musculoskeletal:        General: Normal range of motion.     Cervical back: Normal range of motion.  Skin:    General: Skin is warm and dry.  Neurological:     General: No focal deficit present.     Mental Status: He is alert and oriented to person, place, and time.  Psychiatric:        Mood and Affect: Mood normal.        Behavior: Behavior normal.        Thought Content: Thought content normal.        Judgment: Judgment normal.     Assessment/Plan:  Mr. Sermon scheduled for bilateral upper lid blepharoplasty with Dr. Claudia Desanctis.  Risks, benefits, and alternatives of procedure discussed, questions answered and consent obtained.    Smoking Status: nonsmoker; Counseling Given? N/A  Caprini Score: High; Risk Factors include: 79 year old male, BMI > 25, and length of planned surgery. Recommendation for mechanical and pharmacological prophylaxis during surgery. Encourage early ambulation.   Pictures obtained: 08/28/2020  Post-op Rx sent to pharmacy: Norco, Zofran, Tylenol  Patient was provided with the General Surgical Risk consent document and Pain Medication Agreement prior to their appointment.  They had adequate time to read through the risk consent documents and Pain Medication Agreement. We also discussed them in person  together during this preop appointment. All of their questions were answered to their satisfaction.  Recommended calling if they have any further questions.  Risk consent form and Pain Medication Agreement to be scanned into patient's chart.  The risks that can be encountered with and after a blepharoplasty were discussed and include the following but no limited to these:  Asymmetry, dry eyes, lid lag, sensitivity to sun or bright light, difficulty closing your eyes, outward rolling of the eyelid, change in vision, fluid accumulation, firmness of the area, fat necrosis with death of fat tissue, bleeding, infection, delayed healing, anesthesia risks, skin sensation changes, injury to structures including nerves, blood vessels, and muscles which may be temporary or permanent, hair loss, allergies to tape, suture materials and glues, blood products, topical preparations or injected agents, skin and contour irregularities, skin discoloration and swelling, deep vein thrombosis, cardiac and pulmonary complications, pain, which may persist, persistent pain, recurrence, poor healing of the incision, possible need for revisional surgery or staged procedures. Thiere can also be persistent swelling, poor wound healing, rippling or loose skin, swelling. Any change in weight fluctuations can alter the outcome.  Electronically signed by: Threasa Heads, PA-C 10/30/2020 9:32 AM

## 2020-10-27 ENCOUNTER — Telehealth: Payer: Self-pay | Admitting: Pulmonary Disease

## 2020-10-27 DIAGNOSIS — R3912 Poor urinary stream: Secondary | ICD-10-CM | POA: Diagnosis not present

## 2020-10-28 NOTE — Telephone Encounter (Signed)
Spoke with the pt  He is asking for the CT Chest results from 10/14/2020 Please advise, thanks!

## 2020-10-28 NOTE — Telephone Encounter (Signed)
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 No new recommendations at this point.

## 2020-10-28 NOTE — Telephone Encounter (Signed)
ATC patient to let him know CT results. Per DPR left detailed message for patient advised to call office with any other questions. Nothing further needed at this time.

## 2020-10-28 NOTE — Telephone Encounter (Signed)
Patient is checking on CT scan results. Patient phone number is [phone].

## 2020-10-30 ENCOUNTER — Encounter: Payer: Self-pay | Admitting: Plastic Surgery

## 2020-10-30 ENCOUNTER — Other Ambulatory Visit: Payer: Self-pay

## 2020-10-30 ENCOUNTER — Ambulatory Visit (INDEPENDENT_AMBULATORY_CARE_PROVIDER_SITE_OTHER): Payer: Medicare Other | Admitting: Plastic Surgery

## 2020-10-30 VITALS — BP 170/83 | HR 65 | Temp 98.0°F | Ht 65.0 in | Wt 179.0 lb

## 2020-10-30 DIAGNOSIS — H02834 Dermatochalasis of left upper eyelid: Secondary | ICD-10-CM

## 2020-10-30 DIAGNOSIS — H02831 Dermatochalasis of right upper eyelid: Secondary | ICD-10-CM

## 2020-10-30 MED ORDER — ONDANSETRON HCL 4 MG PO TABS: 4.0000 mg | ORAL_TABLET | Freq: Three times a day (TID) | ORAL | 0 refills | Status: DC | PRN

## 2020-10-30 MED ORDER — HYDROCODONE-ACETAMINOPHEN 5-325 MG PO TABS
1.0000 | ORAL_TABLET | Freq: Three times a day (TID) | ORAL | 0 refills | Status: AC | PRN
Start: 2020-10-30 — End: 2020-11-02

## 2020-10-30 MED ORDER — ACETAMINOPHEN 500 MG PO TABS: 500.0000 mg | ORAL_TABLET | Freq: Four times a day (QID) | ORAL | 0 refills | Status: DC | PRN

## 2020-11-03 ENCOUNTER — Telehealth: Payer: Self-pay | Admitting: Internal Medicine

## 2020-11-03 NOTE — Progress Notes (Signed)
  Chronic Care Management   Note  11/03/2020 Name: David Goodman MRN: [id number] DOB: Sep 19, 1941  David Goodman is a 79 y.o. year old male who is a primary care patient of Isaac Bliss, Rayford Halsted, MD. I reached out to Mannie Stabile by phone today in response to a referral sent by Mr. Souleymane Malachowski's PCP, Isaac Bliss, Rayford Halsted, MD.   [name] was given information about Chronic Care Management services today including:  1. CCM service includes personalized support from designated clinical staff supervised by his physician, including individualized plan of care and coordination with other care providers 2. 24/7 contact phone numbers for assistance for urgent and routine care needs. 3. Service will only be billed when office clinical staff spend 20 minutes or more in a month to coordinate care. 4. Only one practitioner may furnish and bill the service in a calendar month. 5. The patient may stop CCM services at any time (effective at the end of the month) by phone call to the office staff.   Patient agreed to services and verbal consent obtained.   Follow up plan:   Carley Perdue [employer] Scheduler

## 2020-11-04 ENCOUNTER — Other Ambulatory Visit: Payer: Self-pay

## 2020-11-04 ENCOUNTER — Encounter (HOSPITAL_BASED_OUTPATIENT_CLINIC_OR_DEPARTMENT_OTHER): Payer: Self-pay | Admitting: Plastic Surgery

## 2020-11-04 NOTE — Progress Notes (Signed)
Chart reviewed by Dr. Elgie Congo and will proceed with surgery as scheduled at [facility]

## 2020-11-06 ENCOUNTER — Telehealth: Payer: Self-pay | Admitting: *Deleted

## 2020-11-06 NOTE — Telephone Encounter (Signed)
Yes

## 2020-11-06 NOTE — Telephone Encounter (Signed)
losartan (COZAAR) 100 MG tablet  Is on backorder  Okay to send in losartan 50 mg, 2 tabs once daily?

## 2020-11-07 ENCOUNTER — Other Ambulatory Visit (HOSPITAL_COMMUNITY)
Admission: RE | Admit: 2020-11-07 | Discharge: 2020-11-07 | Disposition: A | Discharge status: Home / Self Care | Payer: Medicare Other | Source: Ambulatory Visit | Attending: Plastic Surgery | Admitting: Plastic Surgery

## 2020-11-07 DIAGNOSIS — Z01812 Encounter for preprocedural laboratory examination: Secondary | ICD-10-CM | POA: Insufficient documentation

## 2020-11-07 DIAGNOSIS — Z20822 Contact with and (suspected) exposure to covid-19: Secondary | ICD-10-CM | POA: Insufficient documentation

## 2020-11-07 LAB — SARS CORONAVIRUS 2 (TAT 6-24 HRS): SARS Coronavirus 2: NEGATIVE

## 2020-11-07 NOTE — Telephone Encounter (Signed)
Spoke with patient and no refills needed at this time.

## 2020-11-10 ENCOUNTER — Ambulatory Visit (HOSPITAL_BASED_OUTPATIENT_CLINIC_OR_DEPARTMENT_OTHER)
Admission: RE | Admit: 2020-11-10 | Discharge: 2020-11-10 | Disposition: A | Discharge status: Home / Self Care | Payer: Medicare Other | Attending: Plastic Surgery | Admitting: Plastic Surgery

## 2020-11-10 ENCOUNTER — Encounter (HOSPITAL_BASED_OUTPATIENT_CLINIC_OR_DEPARTMENT_OTHER): Payer: Self-pay | Admitting: Plastic Surgery

## 2020-11-10 ENCOUNTER — Other Ambulatory Visit: Payer: Self-pay

## 2020-11-10 ENCOUNTER — Ambulatory Visit (HOSPITAL_BASED_OUTPATIENT_CLINIC_OR_DEPARTMENT_OTHER): Payer: Medicare Other | Admitting: Anesthesiology

## 2020-11-10 ENCOUNTER — Encounter (HOSPITAL_BASED_OUTPATIENT_CLINIC_OR_DEPARTMENT_OTHER)
Admission: RE | Disposition: A | Discharge status: Home / Self Care | Payer: Self-pay | Source: Home / Self Care | Attending: Plastic Surgery

## 2020-11-10 ENCOUNTER — Telehealth: Payer: Self-pay

## 2020-11-10 DIAGNOSIS — Z888 Allergy status to other drugs, medicaments and biological substances status: Secondary | ICD-10-CM | POA: Diagnosis not present

## 2020-11-10 DIAGNOSIS — Z79899 Other long term (current) drug therapy: Secondary | ICD-10-CM | POA: Insufficient documentation

## 2020-11-10 DIAGNOSIS — H02834 Dermatochalasis of left upper eyelid: Secondary | ICD-10-CM | POA: Diagnosis not present

## 2020-11-10 DIAGNOSIS — E785 Hyperlipidemia, unspecified: Secondary | ICD-10-CM | POA: Diagnosis not present

## 2020-11-10 DIAGNOSIS — I10 Essential (primary) hypertension: Secondary | ICD-10-CM | POA: Diagnosis not present

## 2020-11-10 DIAGNOSIS — Z7951 Long term (current) use of inhaled steroids: Secondary | ICD-10-CM | POA: Diagnosis not present

## 2020-11-10 DIAGNOSIS — H02831 Dermatochalasis of right upper eyelid: Secondary | ICD-10-CM | POA: Insufficient documentation

## 2020-11-10 HISTORY — DX: Gastro-esophageal reflux disease without esophagitis: K21.9

## 2020-11-10 HISTORY — PX: BROW LIFT: SHX178

## 2020-11-10 SURGERY — BLEPHAROPLASTY
Anesthesia: General | Site: Eye | Laterality: Bilateral

## 2020-11-10 MED ORDER — BSS IO SOLN
INTRAOCULAR | Status: DC | PRN
  Administered 2020-11-10: 15 mL

## 2020-11-10 MED ORDER — DEXAMETHASONE SODIUM PHOSPHATE 10 MG/ML IJ SOLN
INTRAMUSCULAR | Status: DC | PRN
  Administered 2020-11-10: 5 mg via INTRAVENOUS

## 2020-11-10 MED ORDER — PROPOFOL 10 MG/ML IV BOLUS
INTRAVENOUS | Status: AC
  Filled 2020-11-10: qty 20

## 2020-11-10 MED ORDER — TOBRAMYCIN-DEXAMETHASONE 0.3-0.1 % OP OINT
TOPICAL_OINTMENT | OPHTHALMIC | Status: AC
  Filled 2020-11-10: qty 3.5

## 2020-11-10 MED ORDER — LIDOCAINE HCL (CARDIAC) PF 100 MG/5ML IV SOSY
PREFILLED_SYRINGE | INTRAVENOUS | Status: DC | PRN
  Administered 2020-11-10: 60 mg via INTRAVENOUS

## 2020-11-10 MED ORDER — 0.9 % SODIUM CHLORIDE (POUR BTL) OPTIME
TOPICAL | Status: DC | PRN
  Administered 2020-11-10: 08:00:00 500 mL

## 2020-11-10 MED ORDER — LABETALOL HCL 5 MG/ML IV SOLN
INTRAVENOUS | Status: AC
  Filled 2020-11-10: qty 4

## 2020-11-10 MED ORDER — ONDANSETRON HCL 4 MG/2ML IJ SOLN
INTRAMUSCULAR | Status: DC | PRN
  Administered 2020-11-10: 4 mg via INTRAVENOUS

## 2020-11-10 MED ORDER — LIDOCAINE-EPINEPHRINE 1 %-1:100000 IJ SOLN
INTRAMUSCULAR | Status: AC
  Filled 2020-11-10: qty 1

## 2020-11-10 MED ORDER — LIDOCAINE-EPINEPHRINE 1 %-1:100000 IJ SOLN
INTRAMUSCULAR | Status: DC | PRN
  Administered 2020-11-10: 15 mL

## 2020-11-10 MED ORDER — LABETALOL HCL 5 MG/ML IV SOLN
10.0000 mg | Freq: Once | INTRAVENOUS | Status: AC
  Administered 2020-11-10: 10 mg via INTRAVENOUS

## 2020-11-10 MED ORDER — PHENYLEPHRINE HCL (PRESSORS) 10 MG/ML IV SOLN
INTRAVENOUS | Status: DC | PRN
  Administered 2020-11-10: 80 ug via INTRAVENOUS
  Administered 2020-11-10: 200 ug via INTRAVENOUS

## 2020-11-10 MED ORDER — BACITRACIN-POLYMYXIN B 500-10000 UNIT/GM OP OINT
TOPICAL_OINTMENT | OPHTHALMIC | Status: DC | PRN
  Administered 2020-11-10: 1 via OPHTHALMIC

## 2020-11-10 MED ORDER — CEFAZOLIN SODIUM-DEXTROSE 2-4 GM/100ML-% IV SOLN
INTRAVENOUS | Status: AC
  Filled 2020-11-10: qty 100

## 2020-11-10 MED ORDER — ONDANSETRON HCL 4 MG/2ML IJ SOLN
INTRAMUSCULAR | Status: AC
  Filled 2020-11-10: qty 2

## 2020-11-10 MED ORDER — BSS IO SOLN
INTRAOCULAR | Status: AC
  Filled 2020-11-10: qty 15

## 2020-11-10 MED ORDER — FENTANYL CITRATE (PF) 100 MCG/2ML IJ SOLN
INTRAMUSCULAR | Status: AC
  Filled 2020-11-10: qty 2

## 2020-11-10 MED ORDER — AMISULPRIDE (ANTIEMETIC) 5 MG/2ML IV SOLN: 10.0000 mg | Freq: Once | INTRAVENOUS | Status: DC | PRN

## 2020-11-10 MED ORDER — PROPOFOL 10 MG/ML IV BOLUS
INTRAVENOUS | Status: DC | PRN
  Administered 2020-11-10: 150 mg via INTRAVENOUS

## 2020-11-10 MED ORDER — DEXAMETHASONE SODIUM PHOSPHATE 10 MG/ML IJ SOLN
INTRAMUSCULAR | Status: AC
  Filled 2020-11-10: qty 1

## 2020-11-10 MED ORDER — FENTANYL CITRATE (PF) 100 MCG/2ML IJ SOLN
25.0000 ug | INTRAMUSCULAR | Status: DC | PRN
  Administered 2020-11-10: 25 ug via INTRAVENOUS

## 2020-11-10 MED ORDER — LACTATED RINGERS IV SOLN: INTRAVENOUS | Status: DC | PRN

## 2020-11-10 MED ORDER — FENTANYL CITRATE (PF) 100 MCG/2ML IJ SOLN
INTRAMUSCULAR | Status: DC | PRN
  Administered 2020-11-10: 50 ug via INTRAVENOUS
  Administered 2020-11-10 (×2): 25 ug via INTRAVENOUS

## 2020-11-10 MED ORDER — BACITRACIN-POLYMYXIN B 500-10000 UNIT/GM OP OINT
TOPICAL_OINTMENT | OPHTHALMIC | Status: AC
  Filled 2020-11-10: qty 3.5

## 2020-11-10 MED ORDER — LIDOCAINE 2% (20 MG/ML) 5 ML SYRINGE
INTRAMUSCULAR | Status: AC
  Filled 2020-11-10: qty 5

## 2020-11-10 MED ORDER — MIDAZOLAM HCL 2 MG/2ML IJ SOLN
INTRAMUSCULAR | Status: AC
  Filled 2020-11-10: qty 2

## 2020-11-10 SURGICAL SUPPLY — 47 items
ADH SKN CLS APL DERMABOND .7 (GAUZE/BANDAGES/DRESSINGS)
APL SRG 3 HI ABS STRL LF PLS (MISCELLANEOUS)
APL SWBSTK 6 STRL LF DISP (MISCELLANEOUS) ×1
APPLICATOR COTTON TIP 6 STRL (MISCELLANEOUS) ×1 IMPLANT
APPLICATOR COTTON TIP 6IN STRL (MISCELLANEOUS) ×3
APPLICATOR DR MATTHEWS STRL (MISCELLANEOUS) IMPLANT
BLADE SURG 15 STRL LF DISP TIS (BLADE) ×1 IMPLANT
BLADE SURG 15 STRL SS (BLADE) ×3
BNDG EYE OVAL (GAUZE/BANDAGES/DRESSINGS) ×6 IMPLANT
CLOSURE WOUND 1/2 X4 (GAUZE/BANDAGES/DRESSINGS) ×1
COVER BACK TABLE 60X90IN (DRAPES) ×3 IMPLANT
COVER MAYO STAND STRL (DRAPES) ×3 IMPLANT
COVER WAND RF STERILE (DRAPES) IMPLANT
DECANTER SPIKE VIAL GLASS SM (MISCELLANEOUS) IMPLANT
DERMABOND ADVANCED (GAUZE/BANDAGES/DRESSINGS)
DERMABOND ADVANCED .7 DNX12 (GAUZE/BANDAGES/DRESSINGS) IMPLANT
DRAPE U-SHAPE 76X120 STRL (DRAPES) ×3 IMPLANT
ELECT NDL BLADE 2-5/6 (NEEDLE) ×1 IMPLANT
ELECT NEEDLE BLADE 2-5/6 (NEEDLE) ×3 IMPLANT
ELECT REM PT RETURN 9FT ADLT (ELECTROSURGICAL) ×3
ELECTRODE REM PT RTRN 9FT ADLT (ELECTROSURGICAL) ×1 IMPLANT
GLOVE BIO SURGEON STRL SZ7.5 (GLOVE) IMPLANT
GLOVE SRG 8 PF TXTR STRL LF DI (GLOVE) ×1 IMPLANT
GLOVE SURG ENC TEXT LTX SZ7.5 (GLOVE) ×3 IMPLANT
GLOVE SURG UNDER POLY LF SZ8 (GLOVE) ×3
GOWN STRL REUS W/ TWL LRG LVL3 (GOWN DISPOSABLE) ×2 IMPLANT
GOWN STRL REUS W/TWL LRG LVL3 (GOWN DISPOSABLE) ×6
NDL HYPO 30GX1 BEV (NEEDLE) IMPLANT
NDL PRECISIONGLIDE 27X1.5 (NEEDLE) IMPLANT
NEEDLE HYPO 30GX1 BEV (NEEDLE) IMPLANT
NEEDLE PRECISIONGLIDE 27X1.5 (NEEDLE) IMPLANT
PACK BASIN DAY SURGERY FS (CUSTOM PROCEDURE TRAY) ×3 IMPLANT
PENCIL SMOKE EVACUATOR (MISCELLANEOUS) ×3 IMPLANT
SHEILD EYE MED CORNL SHD 22X21 (OPHTHALMIC RELATED) ×6
SHIELD EYE MED CORNL SHD 22X21 (OPHTHALMIC RELATED) ×2 IMPLANT
SLEEVE SCD COMPRESS KNEE MED (MISCELLANEOUS) IMPLANT
SPONGE LAP 4X18 RFD (DISPOSABLE) ×2 IMPLANT
STRIP CLOSURE SKIN 1/2X4 (GAUZE/BANDAGES/DRESSINGS) ×2 IMPLANT
STRIP SUTURE WOUND CLOSURE 1/2 (MISCELLANEOUS) IMPLANT
SUT MNCRL 6-0 UNDY P1 1X18 (SUTURE) IMPLANT
SUT MONOCRYL 6-0 P1 1X18 (SUTURE)
SUT PLAIN 5 0 P 3 18 (SUTURE) ×5 IMPLANT
SUT PROLENE 5 0 P 3 (SUTURE) ×3 IMPLANT
SUT PROLENE 6 0 P 1 18 (SUTURE) IMPLANT
SYR CONTROL 10ML LL (SYRINGE) ×3 IMPLANT
TOWEL GREEN STERILE FF (TOWEL DISPOSABLE) ×6 IMPLANT
TRAY DSU PREP LF (CUSTOM PROCEDURE TRAY) ×3 IMPLANT

## 2020-11-10 NOTE — Transfer of Care (Signed)
Immediate Anesthesia Transfer of Care Note  Patient: David Goodman  Procedure(s) Performed: BLEPHAROPLASTY (Bilateral Eye)  Patient Location: PACU  Anesthesia Type:General  Level of Consciousness: awake, alert  and oriented  Airway & Oxygen Therapy: Patient Spontanous Breathing and Patient connected to face mask oxygen  Post-op Assessment: Report given to RN and Post -op Vital signs reviewed and stable  Post vital signs: Reviewed and stable  Last Vitals:  Vitals Value Taken Time  BP    Temp    Pulse 85 11/10/20 0857  Resp 16 11/10/20 0857  SpO2 98 % 11/10/20 0857  Vitals shown include unvalidated device data.  Last Pain:  Vitals:   11/10/20 0709  TempSrc: Oral  PainSc: 0-No pain         Complications: No complications documented.

## 2020-11-10 NOTE — Op Note (Signed)
Operative Note   DATE OF OPERATION: 11/10/2020  SURGICAL DEPARTMENT: Plastic Surgery  PREOPERATIVE DIAGNOSES: Bilateral upper lid dermatochalasis  POSTOPERATIVE DIAGNOSES:  same  PROCEDURE: Bilateral upper lid blepharoplasty  SURGEON: Talmadge Coventry, MD  ASSISTANT: Phoebe Sharps, PA The advanced practice practitioner (APP) assisted throughout the case.  The APP was essential in retraction and counter traction when needed to make the case progress smoothly.  This retraction and assistance made it possible to see the tissue plans for the procedure.  The assistance was needed for blood control, tissue re-approximation and assisted with closure of the incision site.  ANESTHESIA:  General.   COMPLICATIONS: None.   INDICATIONS FOR PROCEDURE:  The patient, David Goodman is a 79 y.o. male born on [birth year], is here for treatment of bilateral upper lid dermatochalasis MRN: [id number]  CONSENT:  Informed consent was obtained directly from the patient. Risks, benefits and alternatives were fully discussed. Specific risks including but not limited to bleeding, infection, hematoma, seroma, scarring, pain, contracture, asymmetry, wound healing problems, and need for further surgery were all discussed. The patient did have an ample opportunity to have questions answered to satisfaction.   DESCRIPTION OF PROCEDURE:  The patient was taken to the operating room. SCDs were placed and antibiotics were given.  General anesthesia was administered.  The patient's operative site was prepped and draped in a sterile fashion. A time out was performed and all information was confirmed to be correct.  I started by marking out the planned incisions.  His upper lid tarsal crease was about 9 mm from the lid margin and was fairly symmetric on each side.  I then marked out 11 m skin inferior to the eyebrow and plan the upper incision in this location.  It was then extended in either direction ending about 1.5 cm  lateral to the lateral canthus.  I then ensured that they were equal and did a pinch test to ensure that there would be no lagophthalmos.  The area was then infiltrated with lidocaine with epinephrine and this was given time to work.  Corneal protectors were placed on each side after copious placements of ophthalmic ointment.  I excised the skin on each side with a 15 blade.  Cautery was used to access the nasal fat pad which was conservatively excised on each side using cautery.  Meticulous hemostasis was obtained and each side was closed with interrupted 5-0 fast gut sutures in a running 5-0 fast gut.  Corneal protectors were removed and the eyes were washed out with BSS.  Ophthalmic ointment was applied to the incisions.  The patient tolerated the procedure well.  There were no complications. The patient was allowed to wake from anesthesia, extubated and taken to the recovery room in satisfactory condition.

## 2020-11-10 NOTE — Telephone Encounter (Signed)
Patient had surgery with Dr. Claudia Desanctis.  He was given a cream and needs to know what to do with it.  Please call.

## 2020-11-10 NOTE — Anesthesia Procedure Notes (Signed)
Procedure Name: LMA Insertion Performed by: Stephenie Navejas M, CRNA Pre-anesthesia Checklist: Patient identified, Emergency Drugs available, Suction available and Patient being monitored Patient Re-evaluated:Patient Re-evaluated prior to induction Oxygen Delivery Method: Circle system utilized Preoxygenation: Pre-oxygenation with 100% oxygen Induction Type: IV induction Ventilation: Mask ventilation without difficulty LMA: LMA inserted LMA Size: 4.0 Number of attempts: 1 Airway Equipment and Method: Bite block Placement Confirmation: positive ETCO2 Tube secured with: Tape Dental Injury: Teeth and Oropharynx as per pre-operative assessment        

## 2020-11-10 NOTE — Telephone Encounter (Signed)
He can apply it twice a day to his incisions (upper eyelids).  Please call patient with instructions

## 2020-11-10 NOTE — Anesthesia Postprocedure Evaluation (Signed)
Anesthesia Post Note  Patient: [name]  Procedure(s) Performed: BLEPHAROPLASTY (Bilateral Eye)     Patient location during evaluation: PACU Anesthesia Type: General Level of consciousness: awake and alert Pain management: pain level controlled Vital Signs Assessment: post-procedure vital signs reviewed and stable Respiratory status: spontaneous breathing, nonlabored ventilation, respiratory function stable and patient connected to nasal cannula oxygen Cardiovascular status: blood pressure returned to baseline and stable Postop Assessment: no apparent nausea or vomiting Anesthetic complications: no   No complications documented.  Last Vitals:  Vitals:   11/10/20 0929 11/10/20 1003  BP: (!) 169/90 (!) 178/94  Pulse: 83 84  Resp: 14 16  Temp:  36.7 C  SpO2: 97% 100%    Last Pain:  Vitals:   11/10/20 0915  TempSrc:   PainSc: 5                  David Goodman

## 2020-11-10 NOTE — Interval H&P Note (Signed)
History and Physical Interval Note:  11/10/2020 7:37 AM  David Goodman  has presented today for surgery, with the diagnosis of Dermatochalasis of both upper eyelids.  The various methods of treatment have been discussed with the patient and family. After consideration of risks, benefits and other options for treatment, the patient has consented to  Procedure(s) with comments: BLEPHAROPLASTY (Bilateral) - 1 hour as a surgical intervention.  The patient's history has been reviewed, patient examined, no change in status, stable for surgery.  I have reviewed the patient's chart and labs.  Questions were answered to the patient's satisfaction.     [name]

## 2020-11-10 NOTE — Discharge Instructions (Signed)
Activity: As tolerated, but avoid strenuous activity until follow up visit.  Diet: Regular  Wound Care: Keep clean & dry for 2 days.  After that you can shower normally.  Don't rub eyelids.  Special Instructions: * Use Ice often to help with swelling and bruising * Avoid NSAIDs (Ibuprofen, Advil, motrin, etc.) and aspirin for a few days after surgery. Use Tylenol for pain * Sleep with head elevated to help with swelling and bruising  Call our office if any unusual problems occur such as pain, excessive bleeding, unrelieved nausea/vomiting, fever &/or chills.  Follow-up appointment: Scheduled for Dec 22.   Post Anesthesia Home Care Instructions  Activity: Get plenty of rest for the remainder of the day. A responsible individual must stay with you for 24 hours following the procedure.  For the next 24 hours, DO NOT: -Drive a car -Paediatric nurse -Drink alcoholic beverages -Take any medication unless instructed by your physician -Make any legal decisions or sign important papers.  Meals: Start with liquid foods such as gelatin or soup. Progress to regular foods as tolerated. Avoid greasy, spicy, heavy foods. If nausea and/or vomiting occur, drink only clear liquids until the nausea and/or vomiting subsides. Call your physician if vomiting continues.  Special Instructions/Symptoms: Your throat may feel dry or sore from the anesthesia or the breathing tube placed in your throat during surgery. If this causes discomfort, gargle with warm salt water. The discomfort should disappear within 24 hours.  If you had a scopolamine patch placed behind your ear for the management of post- operative nausea and/or vomiting:  1. The medication in the patch is effective for 72 hours, after which it should be removed.  Wrap patch in a tissue and discard in the trash. Wash hands thoroughly with soap and water. 2. You may remove the patch earlier than 72 hours if you experience unpleasant side  effects which may include dry mouth, dizziness or visual disturbances. 3. Avoid touching the patch. Wash your hands with soap and water after contact with the patch.

## 2020-11-10 NOTE — Anesthesia Preprocedure Evaluation (Signed)
Anesthesia Evaluation  Patient identified by MRN, date of birth, ID band Patient awake    Reviewed: Allergy & Precautions, NPO status , Patient's Chart, lab work & pertinent test results  Airway Mallampati: II  TM Distance: >3 FB     Dental  (+) Dental Advisory Given   Pulmonary neg pulmonary ROS,    breath sounds clear to auscultation       Cardiovascular hypertension, Pt. on medications  Rhythm:Regular Rate:Normal     Neuro/Psych negative neurological ROS     GI/Hepatic Neg liver ROS, PUD, GERD  ,  Endo/Other  negative endocrine ROS  Renal/GU negative Renal ROS     Musculoskeletal   Abdominal   Peds  Hematology negative hematology ROS (+)   Anesthesia Other Findings   Reproductive/Obstetrics                             Anesthesia Physical Anesthesia Plan  ASA: II  Anesthesia Plan: General   Post-op Pain Management:    Induction: Intravenous  PONV Risk Score and Plan: 2 and Dexamethasone, Ondansetron and Treatment may vary due to age or medical condition  Airway Management Planned: LMA and Oral ETT  Additional Equipment:   Intra-op Plan:   Post-operative Plan: Extubation in OR  Informed Consent: I have reviewed the patients History and Physical, chart, labs and discussed the procedure including the risks, benefits and alternatives for the proposed anesthesia with the patient or authorized representative who has indicated his/her understanding and acceptance.     Dental advisory given  Plan Discussed with: CRNA  Anesthesia Plan Comments:         Anesthesia Quick Evaluation

## 2020-11-10 NOTE — Brief Op Note (Signed)
11/10/2020  8:52 AM  PATIENT:  David Goodman  79 y.o. male  PRE-OPERATIVE DIAGNOSIS:  Dermatochalasis of both upper eyelids  POST-OPERATIVE DIAGNOSIS:  Dermatochalasis of both upper eyelids  PROCEDURE:  Procedure(s) with comments: BLEPHAROPLASTY (Bilateral) - 1 hour  SURGEON:  Surgeon(s) and Role:    * Yoselyn Mcglade, Steffanie Dunn, MD - Primary  PHYSICIAN ASSISTANT: Phoebe Sharps, PA  ASSISTANTS: none   ANESTHESIA:   general  EBL:  5   BLOOD ADMINISTERED:none  DRAINS: none   LOCAL MEDICATIONS USED:  XYLOCAINE   SPECIMEN:  No Specimen  DISPOSITION OF SPECIMEN:  N/A  COUNTS:  YES  TOURNIQUET:  * No tourniquets in log *  DICTATION: .Dragon Dictation  PLAN OF CARE: Discharge to home after PACU  PATIENT DISPOSITION:  PACU - hemodynamically stable.   Delay start of Pharmacological VTE agent (>24hrs) due to surgical blood loss or risk of bleeding: not applicable

## 2020-11-11 ENCOUNTER — Encounter (HOSPITAL_BASED_OUTPATIENT_CLINIC_OR_DEPARTMENT_OTHER): Payer: Self-pay | Admitting: Plastic Surgery

## 2020-11-11 NOTE — Telephone Encounter (Signed)
11/10/20- called pt regarding his question about the ointment that the surgery center sent home with him following upper blepharoplasty surgery with Dr. Claudia Desanctis He was not sure how to use it. I consulted with Venetia Night, [place]- & per her instructions pt will use the antibiotic ointment  As follows: Apply to incision 2x/day. He reports that he is keeping his head elevated, using cold pack as directed. He did report that both eyes were swollen and felt "tight". I reminded him to call for any concerns. Pt agrees with plan of care.

## 2020-11-18 ENCOUNTER — Encounter: Payer: Medicare Other | Admitting: Internal Medicine

## 2020-11-19 ENCOUNTER — Ambulatory Visit (INDEPENDENT_AMBULATORY_CARE_PROVIDER_SITE_OTHER): Payer: Medicare Other | Admitting: Plastic Surgery

## 2020-11-19 ENCOUNTER — Other Ambulatory Visit: Payer: Self-pay

## 2020-11-19 ENCOUNTER — Encounter: Payer: Medicare Other | Admitting: Plastic Surgery

## 2020-11-19 ENCOUNTER — Encounter: Payer: Self-pay | Admitting: Plastic Surgery

## 2020-11-19 VITALS — BP 145/79 | HR 68 | Temp 98.0°F

## 2020-11-19 DIAGNOSIS — H02834 Dermatochalasis of left upper eyelid: Secondary | ICD-10-CM

## 2020-11-19 DIAGNOSIS — H02831 Dermatochalasis of right upper eyelid: Secondary | ICD-10-CM

## 2020-11-19 NOTE — Progress Notes (Signed)
Patient presents about 1 week out from bilateral upper lid blepharoplasty.  He feels like he is doing well and has no complaints.  On exam everything is healing fine.  He has a little bit of bruising still and a little bit of swelling but this all looks to be improving.  We will have him continue to ice it and take it easy and see him again in a few weeks.

## 2020-11-25 ENCOUNTER — Other Ambulatory Visit: Payer: Self-pay | Admitting: Internal Medicine

## 2020-11-26 ENCOUNTER — Encounter: Payer: Medicare Other | Admitting: Plastic Surgery

## 2020-12-03 ENCOUNTER — Ambulatory Visit (INDEPENDENT_AMBULATORY_CARE_PROVIDER_SITE_OTHER): Payer: Medicare Other | Admitting: Surgical

## 2020-12-03 ENCOUNTER — Encounter: Payer: Self-pay | Admitting: Surgical

## 2020-12-03 ENCOUNTER — Other Ambulatory Visit: Payer: Self-pay

## 2020-12-03 VITALS — BP 170/91 | HR 79 | Temp 97.8°F

## 2020-12-03 DIAGNOSIS — H02834 Dermatochalasis of left upper eyelid: Secondary | ICD-10-CM

## 2020-12-03 DIAGNOSIS — H02831 Dermatochalasis of right upper eyelid: Secondary | ICD-10-CM

## 2020-12-03 NOTE — Progress Notes (Signed)
Patient is a 80 year old male here for follow-up after bilateral upper lid blepharoplasty with Dr. Arita Miss on 11/10/2020.  He reports that he is doing well, he has some questions about some swelling.  He reports that the bruising has resolved.  On exam everything is healed nicely, no wounds noted.  He does have some swelling still.  Discussed options for additional follow-up with patient, he reports that at this time he is doing well and he would like to follow-up only as needed.  He reports that he would call us with any questions or concerns or if he notices any concerning changes.  He reports that he is very pleased, reports improvement in his vision.  There is no sign of infection.

## 2020-12-05 ENCOUNTER — Encounter: Payer: Medicare Other | Admitting: Internal Medicine

## 2020-12-06 DIAGNOSIS — Z1152 Encounter for screening for COVID-19: Secondary | ICD-10-CM | POA: Diagnosis not present

## 2020-12-08 ENCOUNTER — Telehealth: Payer: Self-pay | Admitting: Internal Medicine

## 2020-12-08 NOTE — Telephone Encounter (Signed)
Pt call and want a call back to go to a respiratory clinic.

## 2020-12-09 ENCOUNTER — Telehealth: Payer: Self-pay | Admitting: Internal Medicine

## 2020-12-09 ENCOUNTER — Telehealth: Payer: Medicare Other | Admitting: Family Medicine

## 2020-12-09 ENCOUNTER — Other Ambulatory Visit: Payer: Self-pay

## 2020-12-09 ENCOUNTER — Other Ambulatory Visit: Payer: Self-pay | Admitting: *Deleted

## 2020-12-09 ENCOUNTER — Ambulatory Visit (INDEPENDENT_AMBULATORY_CARE_PROVIDER_SITE_OTHER): Payer: Medicare Other | Admitting: Internal Medicine

## 2020-12-09 DIAGNOSIS — R6889 Other general symptoms and signs: Secondary | ICD-10-CM | POA: Diagnosis not present

## 2020-12-09 DIAGNOSIS — R059 Cough, unspecified: Secondary | ICD-10-CM

## 2020-12-09 MED ORDER — HYDROCOD POLST-CPM POLST ER 10-8 MG/5ML PO SUER
5.0000 mL | Freq: Two times a day (BID) | ORAL | 0 refills | Status: DC | PRN
Start: 2020-12-09 — End: 2020-12-09

## 2020-12-09 MED ORDER — HYDROCOD POLST-CPM POLST ER 10-8 MG/5ML PO SUER: 5.0000 mL | Freq: Two times a day (BID) | ORAL | 0 refills | Status: DC | PRN

## 2020-12-09 NOTE — Telephone Encounter (Signed)
Can we call him and find out more about this? Can we schedule him for a VV today?

## 2020-12-09 NOTE — Telephone Encounter (Signed)
Pts spouse is calling to check the status of the cough medicine.  She is aware that they are working on it and it should be there today.

## 2020-12-09 NOTE — Progress Notes (Signed)
Virtual Visit via Telephone Note  I connected with David Goodman on 12/09/20 at  1:00 PM EST by telephone and verified that I am speaking with the correct person using two identifiers.   I discussed the limitations, risks, security and privacy concerns of performing an evaluation and management service by telephone and the availability of in person appointments. I also discussed with the patient that there may be a patient responsible charge related to this service. The patient expressed understanding and agreed to proceed.  Location patient: home Location provider: work office Participants present for the call: patient, provider Patient did not have a visit in the prior 7 days to address this/these issue(s).   History of Present Illness:  David Goodman has scheduled this visit to discuss some flulike symptoms that began last Thursday.  He describes a nonproductive cough myalgias and body aches coupled with fatigue and headache.  He went to the [facility] and had a PCR COVID test that was negative, he subsequently did a rapid antigen test at home that was also negative.  He has had all 3 COVID vaccines.  He states that the cough is reminiscent of when he has had bronchitis in the past.  He has been having rib cage pain from excessive coughing, no true chest pain.  He would like something to help him with the cough especially at nighttime.   Observations/Objective: Patient sounds cheerful and well on the phone. I do not appreciate any increased work of breathing. Speech and thought processing are grossly intact. Patient reported vitals: None reported   Current Outpatient Medications:  .  chlorpheniramine-HYDROcodone (TUSSIONEX PENNKINETIC ER) 10-8 MG/5ML SUER, Take 5 mLs by mouth every 12 (twelve) hours as needed for cough., Disp: 70 mL, Rfl: 0 .  acetaminophen (TYLENOL) 500 MG tablet, Take 1 tablet (500 mg total) by mouth every 6 (six) hours as needed. For use AFTER surgery, Disp: 30 tablet, Rfl:  0 .  fluticasone (FLONASE) 50 MCG/ACT nasal spray, Place 2 sprays into both nostrils daily., Disp: 16 g, Rfl: 6 .  latanoprost (XALATAN) 0.005 % ophthalmic solution, Place 1 drop into both eyes at bedtime., Disp: 7.5 mL, Rfl: 1 .  losartan (COZAAR) 100 MG tablet, Take 1 tablet by mouth once daily, Disp: 90 tablet, Rfl: 1 .  MAGNESIUM PO, Take 1 tablet by mouth daily., Disp: , Rfl:  .  naproxen (NAPROSYN) 500 MG tablet, TAKE 1 TABLET BY MOUTH TWO  TIMES DAILY WITH MEALS, Disp: 180 tablet, Rfl: 3 .  omeprazole (PRILOSEC) 20 MG capsule, TAKE 1 CAPSULE BY MOUTH  DAILY, Disp: 90 capsule, Rfl: 1 .  tadalafil (CIALIS) 20 MG tablet, Take 1 tablet (20 mg total) by mouth daily as needed for erectile dysfunction., Disp: 30 tablet, Rfl: 2 .  tamsulosin (FLOMAX) 0.4 MG CAPS capsule, TAKE 1 CAPSULE BY MOUTH  DAILY, Disp: 90 capsule, Rfl: 3 .  XIIDRA 5 % SOLN, Place 1 drop into both eyes at bedtime. , Disp: , Rfl:   Current Facility-Administered Medications:  .  albuterol (VENTOLIN HFA) 108 (90 Base) MCG/ACT inhaler 2 puff, 2 puff, Inhalation, Q4H PRN, Scot Jun, FNP  Review of Systems:  Constitutional: Denies fever, chills, diaphoresis, appetite change. HEENT: Denies photophobia, eye pain, redness, hearing loss, ear pain, congestion, sore throat, rhinorrhea, sneezing, mouth sores, trouble swallowing, neck pain, neck stiffness and tinnitus.   Respiratory: Denies SOB, DOE, chest tightness,  and wheezing.   Cardiovascular: Denies chest pain, palpitations and leg swelling.  Gastrointestinal: Denies  nausea, vomiting, abdominal pain, diarrhea, constipation, blood in stool and abdominal distention.  Genitourinary: Denies dysuria, urgency, frequency, hematuria, flank pain and difficulty urinating.  Endocrine: Denies: hot or cold intolerance, sweats, changes in hair or nails, polyuria, polydipsia. Musculoskeletal: Denies back pain, joint swelling, arthralgias and gait problem.  Skin: Denies pallor, rash  and wound.  Neurological: Denies dizziness, seizures, syncope, weakness, light-headedness, numbness and headaches.  Hematological: Denies adenopathy. Easy bruising, personal or family bleeding history  Psychiatric/Behavioral: Denies suicidal ideation, mood changes, confusion, nervousness, sleep disturbance and agitation   Assessment and Plan:  Flu-like symptoms  Cough  -With him being fully vaccinated including booster and having had 2 negative COVID tests over the weekend, I feel confident saying this is unlikely to be COVID. -Influenza, bronchitis, pneumonia remain a possibility. -Given severe cough, I will prescribe Tussionex for him to take 5 cc twice daily as needed. -He will be scheduled to see the respiratory clinic as I believe he needs in person evaluation and possibly a chest x-ray. -He knows that if he develops shortness of breath he will need to go to the emergency department for urgent evaluation.   I discussed the assessment and treatment plan with the patient. The patient was provided an opportunity to ask questions and all were answered. The patient agreed with the plan and demonstrated an understanding of the instructions.   The patient was advised to call back or seek an in-person evaluation if the symptoms worsen or if the condition fails to improve as anticipated.  I provided 16 minutes of non-face-to-face time during this encounter.   Lelon Frohlich, MD [facility]

## 2020-12-09 NOTE — Addendum Note (Signed)
Addended by: Westley Hummer B on: 12/09/2020 02:08 PM   Modules accepted: Orders

## 2020-12-09 NOTE — Telephone Encounter (Signed)
Sent to pharmacy as: chlorpheniramine-HYDROcodone Amanda Cockayne [facility] ER) 10-8 MG/5ML Suspension Extended Release   Earliest Fill Date: 12/09/2020   E-Prescribing Status: Receipt confirmed by pharmacy (12/09/2020 4:20 PM EST)

## 2020-12-09 NOTE — Telephone Encounter (Signed)
Patient is calling and stated that provider sent in medication and pharmacy doesn't have it in stock and pt wanted to see if it can be sent [facility], please advise. CB is [phone]

## 2020-12-09 NOTE — Telephone Encounter (Signed)
Telephone visit scheduled

## 2020-12-10 ENCOUNTER — Ambulatory Visit (INDEPENDENT_AMBULATORY_CARE_PROVIDER_SITE_OTHER): Payer: Medicare Other | Admitting: Internal Medicine

## 2020-12-10 ENCOUNTER — Other Ambulatory Visit: Payer: Self-pay

## 2020-12-10 VITALS — BP 101/62 | HR 95 | Temp 97.0°F | Resp 17

## 2020-12-10 DIAGNOSIS — U071 COVID-19: Secondary | ICD-10-CM

## 2020-12-10 DIAGNOSIS — J209 Acute bronchitis, unspecified: Secondary | ICD-10-CM

## 2020-12-10 DIAGNOSIS — Z1152 Encounter for screening for COVID-19: Secondary | ICD-10-CM | POA: Diagnosis not present

## 2020-12-10 MED ORDER — DOXYCYCLINE HYCLATE 100 MG PO CAPS: 100.0000 mg | ORAL_CAPSULE | Freq: Two times a day (BID) | ORAL | 0 refills | Status: DC

## 2020-12-10 NOTE — Patient Instructions (Addendum)
You will be screened for COVID, flu, and other respiratory viruses Take Robitussin-DM for the cough.  Fill the prescription for the antibiotic. Go to [facility] tomorrow and get chest x-ray to rule out bronchitis or pneumonia.

## 2020-12-10 NOTE — Progress Notes (Signed)
°  Patient's initial symptoms began on 1/6 as fatigue, cough with scant sputum production, and myalgias.  This is in the context of a history of recurrent bronchitis.  He apparently screened negative at the [facility] on 1/8.  He initially told me that he did contract COVID in " 2000".  When I asked him the present year he initially responded "2002".  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  He did finally correct the years to 2020 & 2022. Symptoms are stable; but he is concerned about having bronchitis.  Significant medical comorbidities present include: History of lower GI bleed and hematochezia with acute blood loss anemia and presyncope.  This is in the context of history of GERD and peptic ulcer disease.  He was hospitalized for this 8/18 - 07/18/2020.  Other medical diagnoses include essential hypertension, dyslipidemia and history of diverticulitis.  NO PMH of MI, stroke,  chronic kidney disease, chronic liver disease,  sleep apnea, diabetes, history of cancer, or smoking   Positive review of systems for Covid infection are documented above in HPI. Not present are: Constitutional: Fever,  chills HEENT: Eye redness and discharge (conjunctivitis), nasal congestion, sore throat, anosmia, and altered taste Pulmonary: tachypnea, tachycardia, hemoptysis, and chest pain GI: Anorexia, nausea, vomiting, diarrhea Genitourinary: Oliguria or anuria Dermatologic: Rash Neurologic: Headache, dizziness, mental status changes   Physical exam:  Pertinent or positive findings: He appears his stated age.  As noted he is profoundly hard of hearing despite the hearing aids bilaterally.  Heart rhythm was slightly irregular.  Chest was essentially clear except for some expiratory rhonchi in the left lower lobe posteriorly.  Abdomen is protuberant.  He did have good pedal pulses.  General appearance: Adequately nourished; no acute distress, increased work of  breathing is present.   Lymphatic: No lymphadenopathy about the head, neck, axilla. Eyes: No conjunctival inflammation or lid edema is present. There is no scleral icterus. Ears:  External ear exam shows no significant lesions or deformities.   Nose:  External nasal examination shows no deformity or inflammation. Nasal mucosa are pink and moist without lesions, exudates Oral exam:  Lips and gums are healthy appearing. There is no oropharyngeal erythema or exudate. Neck:  No thyromegaly, masses, tenderness noted.    Heart:  No gallop, murmur, click, rub .  Lungs:  without wheezes,  rales, rubs. Abdomen: Bowel sounds are normal. Abdomen is soft and nontender with no organomegaly, hernias, masses. GU: Deferred  Extremities:  No cyanosis, clubbing, edema  Skin: Warm & dry w/o tenting. No significant lesions or rash.  See assessment & plan under each active problem in the Problem List & acutely for this visit

## 2020-12-11 ENCOUNTER — Ambulatory Visit: Admission: RE | Admit: 2020-12-11 | Payer: Medicare Other | Source: Ambulatory Visit

## 2020-12-11 ENCOUNTER — Encounter: Payer: Self-pay | Admitting: Internal Medicine

## 2020-12-11 ENCOUNTER — Other Ambulatory Visit: Payer: Self-pay | Admitting: Internal Medicine

## 2020-12-11 ENCOUNTER — Other Ambulatory Visit: Payer: Self-pay | Admitting: Medical

## 2020-12-11 DIAGNOSIS — J209 Acute bronchitis, unspecified: Secondary | ICD-10-CM | POA: Insufficient documentation

## 2020-12-11 DIAGNOSIS — U071 COVID-19: Secondary | ICD-10-CM | POA: Diagnosis not present

## 2020-12-11 NOTE — Assessment & Plan Note (Addendum)
Doxycycline and Robitussin-DM prescribed for cough.  He requested codeine; I have to his PCP .  NAA COVID testing will be performed at [place].  Chest x-ray recommended at [place] [facility] to assess the left lower lobe rhonchi.

## 2020-12-12 NOTE — Telephone Encounter (Signed)
Pt was contacted via phone verified DOB. Pt states currently at his doctors office for a visit & pt hung up.

## 2020-12-12 NOTE — Telephone Encounter (Signed)
Not sure what this message is about. I have not seen this patient. Could you look into this?

## 2020-12-15 ENCOUNTER — Telehealth: Payer: Self-pay

## 2020-12-15 LAB — COVID-19, FLU A+B AND RSV
Influenza A, NAA: NOT DETECTED
Influenza B, NAA: NOT DETECTED
RSV, NAA: NOT DETECTED
SARS-CoV-2, NAA: NOT DETECTED

## 2020-12-15 NOTE — Telephone Encounter (Signed)
Patient left voice mail requesting COVID results. Unable to see in chart but located in [place] link. Patient was given negative results he is set to see his PCP on 12/24/2020 but requested to be seen at [facility] sooner. Patient stated he is still having cough and unable to sleep at night, He is scheduled with Lazaro Arms, NP for 12/17/2020 at 9:30 am.

## 2020-12-17 ENCOUNTER — Telehealth: Payer: Self-pay | Admitting: Nurse Practitioner

## 2020-12-17 ENCOUNTER — Telehealth (INDEPENDENT_AMBULATORY_CARE_PROVIDER_SITE_OTHER): Payer: Medicare Other | Admitting: Nurse Practitioner

## 2020-12-17 DIAGNOSIS — R059 Cough, unspecified: Secondary | ICD-10-CM | POA: Diagnosis not present

## 2020-12-17 DIAGNOSIS — B349 Viral infection, unspecified: Secondary | ICD-10-CM | POA: Insufficient documentation

## 2020-12-17 DIAGNOSIS — R053 Chronic cough: Secondary | ICD-10-CM | POA: Insufficient documentation

## 2020-12-17 DIAGNOSIS — R052 Subacute cough: Secondary | ICD-10-CM | POA: Insufficient documentation

## 2020-12-17 MED ORDER — PROMETHAZINE-DM 6.25-15 MG/5ML PO SYRP: 5.0000 mL | ORAL_SOLUTION | Freq: Four times a day (QID) | ORAL | 0 refills | Status: DC | PRN

## 2020-12-17 MED ORDER — PREDNISONE 20 MG PO TABS: 20.0000 mg | ORAL_TABLET | Freq: Every day | ORAL | 0 refills | Status: AC

## 2020-12-17 NOTE — Patient Instructions (Signed)
Viral illness Cough:  Stay well hydrated  Stay active  Deep breathing exercises  May take tylenol for fever or pain  May take mucinex twice daily    Follow up:  Follow up if needed

## 2020-12-17 NOTE — Progress Notes (Signed)
Virtual Visit via Telephone Note  I connected with David Goodman on 12/17/20 at  9:30 AM EST by telephone and verified that I am speaking with the correct person using two identifiers.  Location: Patient: home Provider: remote   I discussed the limitations, risks, security and privacy concerns of performing an evaluation and management service by telephone and the availability of in person appointments. I also discussed with the patient that there may be a patient responsible charge related to this service. The patient expressed understanding and agreed to proceed.  Chief Complaint  Patient presents with  . Cough    Seen by PCP and prescribed doxycycline - states that this has not helped.       History of Present Illness:  Patient presents today for a televisit.  Patient was recently seen on 12/10/2020 by primary care office.  Per the last note he had tested negative at the [facility] on 12/06/2020.  His symptoms began on 12/04/2020.  He is having cough fatigue and body aches.  His primary care did prescribe doxycycline and Robitussin.  They recommended a chest x-ray but it does not appear that patient completed the x-ray.  He states that the doxycycline did not help he still has significant cough.  He states that prednisone has helped him in the past. Denies f/c/s, n/v/d, hemoptysis, PND, chest pain or edema.     Observations/Objective:  Vitals with BMI 12/10/2020 12/03/2020 11/19/2020  Height - - -  Weight - - -  BMI - - -  Systolic [phone]  Diastolic 62 91 79  Pulse 95 79 68      Assessment and Plan:  Viral illness Cough:  Stay well hydrated  Stay active  Deep breathing exercises  May take tylenol for fever or pain  May take mucinex twice daily    Follow Up Instructions:  If needed    I discussed the assessment and treatment plan with the patient. The patient was provided an opportunity to ask questions and all were answered. The patient agreed with the plan and  demonstrated an understanding of the instructions.   The patient was advised to call back or seek an in-person evaluation if the symptoms worsen or if the condition fails to improve as anticipated.  I provided 22 minutes of non-face-to-face time during this encounter.   Fenton Foy, NP

## 2020-12-17 NOTE — Telephone Encounter (Signed)
Called pt verified DOB. Notified pt of cough medicine sent to pharmacy & confirmed xray order is placed for pt to go tomorrow.

## 2020-12-17 NOTE — Telephone Encounter (Signed)
Pt called states [facility] does not have the xray order.  Pt says he will go tomorrow for xray once order is placed. Also, pt request cough syrup with codeine for cough and sleep.

## 2020-12-17 NOTE — Addendum Note (Signed)
Addended by: Fenton Foy on: 12/17/2020 01:34 PM   Modules accepted: Orders

## 2020-12-18 ENCOUNTER — Ambulatory Visit
Admission: RE | Admit: 2020-12-18 | Discharge: 2020-12-18 | Disposition: A | Discharge status: Home / Self Care | Payer: Medicare Other | Source: Ambulatory Visit | Attending: Nurse Practitioner | Admitting: Nurse Practitioner

## 2020-12-18 DIAGNOSIS — R059 Cough, unspecified: Secondary | ICD-10-CM | POA: Diagnosis not present

## 2020-12-18 DIAGNOSIS — R0602 Shortness of breath: Secondary | ICD-10-CM | POA: Diagnosis not present

## 2020-12-24 ENCOUNTER — Telehealth: Payer: Self-pay | Admitting: Pulmonary Disease

## 2020-12-24 ENCOUNTER — Encounter: Payer: Self-pay | Admitting: Internal Medicine

## 2020-12-24 ENCOUNTER — Other Ambulatory Visit: Payer: Self-pay

## 2020-12-24 ENCOUNTER — Ambulatory Visit (INDEPENDENT_AMBULATORY_CARE_PROVIDER_SITE_OTHER): Payer: Medicare Other | Admitting: Internal Medicine

## 2020-12-24 VITALS — HR 77 | Ht 64.0 in | Wt 182.0 lb

## 2020-12-24 DIAGNOSIS — Z20822 Contact with and (suspected) exposure to covid-19: Secondary | ICD-10-CM | POA: Diagnosis not present

## 2020-12-24 NOTE — Progress Notes (Signed)
Virtual Visit via Telephone Note  I connected with David Goodman on 12/24/20 at 11:30 AM EST by telephone and verified that I am speaking with the correct person using two identifiers.   I discussed the limitations, risks, security and privacy concerns of performing an evaluation and management service by telephone and the availability of in person appointments. I also discussed with the patient that there may be a patient responsible charge related to this service. The patient expressed understanding and agreed to proceed.  Location patient: home Location provider: work office Participants present for the call: patient, provider Patient did not have a visit in the prior 7 days to address this/these issue(s).   History of Present Illness:  This visit had initially been scheduled as his annual physical, however due to reported symptoms and concern for potential COVID-19 it was rescheduled as a phone consult.  David Goodman tells me that he has been having these symptoms since the beginning of January.  His symptoms consist of shortness of breath mainly.  He was seen at the respiratory clinic on January 13 where he tested negative for Covid.  He had a chest x-ray that showed bibasilar atelectasis.  He did have COVID-19 virus infection back in 2020 and had long-haul symptoms due to this and had been followed by pulmonary.  At 1 point he had been prescribed long-term prednisone.  For reasons that are unclear to me he was prescribed antibiotics, prednisone in addition to cough medication when he was seen on January 13.  He states this did not improve his symptoms.   Since then his symptoms have worsened to now include cough, nasal congestion and a frontal headache.  He feels like the shortness of breath has gotten worse and is mainly with exertion.  He does not have a home pulse oximeter.  He is interested in seeing a cardiologist.  Also feels dizzy and as if "I cannot walk in a straight line".    Observations/Objective: Patient sounds cheerful and well on the phone. I do not appreciate any increased work of breathing. Speech and thought processing are grossly intact. Patient reported vitals: None reported   Current Outpatient Medications:  .  acetaminophen (TYLENOL) 500 MG tablet, Take 1 tablet (500 mg total) by mouth every 6 (six) hours as needed. For use AFTER surgery, Disp: 30 tablet, Rfl: 0 .  chlorpheniramine-HYDROcodone (TUSSIONEX PENNKINETIC ER) 10-8 MG/5ML SUER, Take 5 mLs by mouth every 12 (twelve) hours as needed for cough., Disp: 70 mL, Rfl: 0 .  doxycycline (VIBRAMYCIN) 100 MG capsule, Take 1 capsule (100 mg total) by mouth 2 (two) times daily., Disp: 14 capsule, Rfl: 0 .  fluticasone (FLONASE) 50 MCG/ACT nasal spray, Place 2 sprays into both nostrils daily., Disp: 16 g, Rfl: 6 .  latanoprost (XALATAN) 0.005 % ophthalmic solution, Place 1 drop into both eyes at bedtime., Disp: 7.5 mL, Rfl: 1 .  losartan (COZAAR) 100 MG tablet, Take 1 tablet by mouth once daily, Disp: 90 tablet, Rfl: 1 .  MAGNESIUM PO, Take 1 tablet by mouth daily., Disp: , Rfl:  .  naproxen (NAPROSYN) 500 MG tablet, TAKE 1 TABLET BY MOUTH TWO  TIMES DAILY WITH MEALS, Disp: 180 tablet, Rfl: 3 .  omeprazole (PRILOSEC) 20 MG capsule, TAKE 1 CAPSULE BY MOUTH  DAILY, Disp: 90 capsule, Rfl: 1 .  promethazine-dextromethorphan (PROMETHAZINE-DM) 6.25-15 MG/5ML syrup, Take 5 mLs by mouth 4 (four) times daily as needed for cough., Disp: 118 mL, Rfl: 0 .  tadalafil (CIALIS)  20 MG tablet, Take 1 tablet (20 mg total) by mouth daily as needed for erectile dysfunction., Disp: 30 tablet, Rfl: 2 .  tamsulosin (FLOMAX) 0.4 MG CAPS capsule, TAKE 1 CAPSULE BY MOUTH  DAILY, Disp: 90 capsule, Rfl: 3 .  XIIDRA 5 % SOLN, Place 1 drop into both eyes at bedtime. , Disp: , Rfl:   Current Facility-Administered Medications:  .  albuterol (VENTOLIN HFA) 108 (90 Base) MCG/ACT inhaler 2 puff, 2 puff, Inhalation, Q4H PRN, Scot Jun, FNP  Review of Systems:  Constitutional: Denies fever, chills, diaphoresis, appetite change and fatigue.  HEENT: Denies photophobia, eye pain, redness, hearing loss, mouth sores, trouble swallowing, neck pain, neck stiffness and tinnitus.   Respiratory: Positive for SOB, DOE, cough, chest tightness,  and wheezing.   Cardiovascular: Denies palpitations and leg swelling.  Gastrointestinal: Denies nausea, vomiting, abdominal pain, diarrhea, constipation, blood in stool and abdominal distention.  Genitourinary: Denies dysuria, urgency, frequency, hematuria, flank pain and difficulty urinating.  Endocrine: Denies: hot or cold intolerance, sweats, changes in hair or nails, polyuria, polydipsia. Musculoskeletal: Denies myalgias, back pain, joint swelling, arthralgias and gait problem.  Skin: Denies pallor, rash and wound.  Neurological: Denies  seizures, syncope,  numbness and headaches.  Hematological: Denies adenopathy. Easy bruising, personal or family bleeding history  Psychiatric/Behavioral: Denies suicidal ideation, mood changes, confusion, nervousness, sleep disturbance and agitation   Assessment and Plan:  Suspected COVID-19 virus infection -At this point I believe David Goodman needs in person evaluation.  I do believe he needs to be tested for Covid, however other etiologies need to be ruled out.  I worry that he could be hypoxemic with his dyspnea on exertion.  I also wonder about his cardiac status although he does not have true chest pain.  With his dizziness and the fact that he is having balance issues, I wonder about an acute CVA. -He seems reluctant to visit the ED.  He states that he has felt worse before and does not believe he needs an emergent evaluation.  I have advised that this is my recommendation today.   I discussed the assessment and treatment plan with the patient. The patient was provided an opportunity to ask questions and all were answered. The patient agreed with the  plan and demonstrated an understanding of the instructions.   The patient was advised to call back or seek an in-person evaluation if the symptoms worsen or if the condition fails to improve as anticipated.  I provided 14 minutes of non-face-to-face time during this encounter.   Lelon Frohlich, MD [facility]

## 2020-12-24 NOTE — Telephone Encounter (Signed)
Called and spoke with pt who states he has had increased SOB x3-4 weeks. Pt stated he had bronchitis about 2 weeks ago. Pt will occ cough up clear phlegm, has no chest tightness.  Stated to pt that we needed to schedule an appt as he was supposed to have followed up after HRCT and PFT. Pt did have covid test done 1/13 which came back negative. Pt verbalized understanding. appt scheduled with TP tomorrow 1/27.  Nothing further needed.

## 2020-12-25 ENCOUNTER — Encounter: Payer: Self-pay | Admitting: Adult Health

## 2020-12-25 ENCOUNTER — Ambulatory Visit: Payer: Medicare Other | Admitting: Adult Health

## 2020-12-25 VITALS — BP 160/80 | HR 77 | Temp 97.4°F | Ht 64.0 in | Wt 182.2 lb

## 2020-12-25 DIAGNOSIS — J40 Bronchitis, not specified as acute or chronic: Secondary | ICD-10-CM | POA: Diagnosis not present

## 2020-12-25 DIAGNOSIS — J8410 Pulmonary fibrosis, unspecified: Secondary | ICD-10-CM

## 2020-12-25 DIAGNOSIS — R059 Cough, unspecified: Secondary | ICD-10-CM | POA: Diagnosis not present

## 2020-12-25 MED ORDER — BENZONATATE 200 MG PO CAPS: 200.0000 mg | ORAL_CAPSULE | Freq: Three times a day (TID) | ORAL | 1 refills | Status: DC | PRN

## 2020-12-25 MED ORDER — PREDNISONE 10 MG PO TABS
ORAL_TABLET | ORAL | 0 refills | Status: DC
Start: 2020-12-25 — End: 2021-01-05

## 2020-12-25 NOTE — Patient Instructions (Addendum)
Prednisone taper over next week . Begin Delsym 2 tsp Twice daily  For cough As needed   Begin Tessalon Three times a day  For cough As needed   Albuterol inhaler 1-2 puffs every 6hrs as needed for wheezing /shortness of breath.  Flutter valve Three times a day  For 2 weeks and then As needed   Follow up with Dr. Vaughan Browner in 4 weeks and As needed   Please contact office for sooner follow up if symptoms do not improve or worsen or seek emergency care

## 2020-12-25 NOTE — Progress Notes (Signed)
@Patient  ID: David Goodman, male    DOB: 12-27-1940, 80 y.o.   MRN: [id number]  Chief Complaint  Patient presents with  . Follow-up    Referring provider: Leotis Shames*  HPI: 80 year old male seen for pulmonary consult February 06, 2020 for ongoing dyspnea and abnormal CT chest post COVID-19 infection in December 2020.  TEST/EVENTS :   January 19, 2020 CT Chest WO Contrast: Predominately GGO with some traction bronchiectasis a baisalar segments. NSIP pattern   Pulmonary function test May 30, 2020 no data for FEV1 or FVC.  Total lung capacity 168%, DLCO 103%.  High-resolution CT chest October 14, 2020 showed no lymphadenopathy.  4 mm right lower lobe lung nodule stable.  Mild to moderate patchy groundglass opacities and reticulation throughout both lungs with mild traction bronc he ectasis and architectural distortion.  Compared to January 18, 2020 groundglass opacities and reticulations have improved.  Findings are suggestive of an alternative diagnosis.  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  Patient has been followed since having COVID-17 November 2019.  Patient had ongoing shortness of breath and cough.  Along with an abnormal CT chest.  He was seen in the office for a pulmonary consult in March 2021.  Patient had clinical improvement with time.  He was also treated with steroids and initially with a bronchodilator/ICS inhaler.  Patient says he had been doing well.  But has had ongoing minimally productive cough that will not go away.  Has some shortness of breath with heavy activities.  Recently retested for Covid and was negative.  As above  high-resolution CT chest November 2021 showed post COVID inflammatory fibrosis with improvement in groundglass opacities and reticulation.  Patient denies any hemoptysis, chest pain, orthopnea.   No Known Allergies  Immunization History  Administered Date(s) Administered  . Fluad Quad(high Dose 65+) 08/10/2019  . Influenza, High Dose Seasonal PF 07/31/2015, 09/27/2016, 10/10/2017, 09/30/2018  . Influenza,inj,quad, With Preservative 09/30/2018  . Influenza-Unspecified 09/14/2017, 09/30/2018, 10/28/2020  . PFIZER(Purple Top)SARS-COV-2 Vaccination 01/21/2020, 02/22/2020, 10/28/2020  . Pneumococcal Conjugate-13 05/19/2015  . Pneumococcal Polysaccharide-23 07/16/2009, 09/30/2018  . Pneumococcal-Unspecified 09/30/2018  . Td 07/16/2009  . Tdap 05/19/2015  . Zoster 04/19/2011    Past Medical History:  Diagnosis Date  . ALLERGIC RHINITIS 07/13/2007  . GERD (gastroesophageal reflux disease)   . HYPERLIPIDEMIA 07/13/2007  . HYPERTENSION 07/13/2007  . 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 PEPTIC ULCER DISEASE 07/13/2007  . 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 acetaminophen (TYLENOL) 500 MG tablet Take 1 tablet (500 mg total) by mouth every 6 (six) hours as needed. For use AFTER surgery 30 tablet 0  . benzonatate (TESSALON) 100 MG capsule Take 100 mg by mouth 3 (three) times daily.    Marland Kitchen doxycycline (VIBRAMYCIN) 100 MG capsule Take 1 capsule (100 mg total) by mouth 2 (two) times daily. 14 capsule 0  . fluticasone (FLONASE) 50 MCG/ACT nasal spray Place 2 sprays into both nostrils daily. 16 g 6  .  latanoprost (XALATAN) 0.005 % ophthalmic solution Place 1 drop into both eyes at bedtime. 7.5 mL 1  . losartan (COZAAR) 100 MG tablet Take 1 tablet by mouth once daily 90 tablet  1  . MAGNESIUM PO Take 1 tablet by mouth daily.    . naproxen (NAPROSYN) 500 MG tablet TAKE 1 TABLET BY MOUTH TWO  TIMES DAILY WITH MEALS 180 tablet 3  . omeprazole (PRILOSEC) 20 MG capsule TAKE 1 CAPSULE BY MOUTH  DAILY 90 capsule 1  . tadalafil (CIALIS) 20 MG tablet Take 1 tablet (20 mg total) by mouth daily as needed for erectile dysfunction. 30 tablet 2  . tamsulosin (FLOMAX) 0.4 MG CAPS capsule TAKE 1 CAPSULE BY MOUTH  DAILY 90 capsule 3  . XIIDRA 5 % SOLN Place 1 drop into both eyes at bedtime.     . chlorpheniramine-HYDROcodone (TUSSIONEX PENNKINETIC ER) 10-8 MG/5ML SUER Take 5 mLs by mouth every 12 (twelve) hours as needed for cough. (Patient not taking: Reported on 12/25/2020) 70 mL 0  . promethazine-dextromethorphan (PROMETHAZINE-DM) 6.25-15 MG/5ML syrup Take 5 mLs by mouth 4 (four) times daily as needed for cough. (Patient not taking: Reported on 12/25/2020) 118 mL 0   Facility-Administered Medications Prior to Visit  Medication Dose Route Frequency Provider Last Rate Last Admin  . albuterol (VENTOLIN HFA) 108 (90 Base) MCG/ACT inhaler 2 puff  2 puff Inhalation Q4H PRN Scot Jun, FNP         Review of Systems:   Constitutional:   No  weight loss, night sweats,  Fevers, chills,  +fatigue, or  lassitude.  HEENT:   No headaches,  Difficulty swallowing,  Tooth/dental problems, or  Sore throat,                No sneezing, itching, ear ache, nasal congestion, post nasal drip,   CV:  No chest pain,  Orthopnea, PND, swelling in lower extremities, anasarca, dizziness, palpitations, syncope.   GI  No heartburn, indigestion, abdominal pain, nausea, vomiting, diarrhea, change in bowel habits, loss of appetite, bloody stools.   Resp:  .  No chest wall deformity  Skin: no rash or lesions.  GU: no dysuria, change in color of urine, no urgency or frequency.  No flank pain, no hematuria   MS:  No joint pain or swelling.  No decreased range of motion.  No back pain.    Physical  Exam  BP (!) 160/80 (BP Location: Left Arm, Patient Position: Sitting, Cuff Size: Normal)   Pulse 77   Temp (!) 97.4 F (36.3 C)   Ht 5\' 4"  (1.626 m)   Wt 182 lb 3.2 oz (82.6 kg)   SpO2 98%   BMI 31.27 kg/m   GEN: A/Ox3; pleasant , NAD, well nourished    HEENT:  [place]/AT,   NOSE-clear, THROAT-clear, no lesions, no postnasal drip or exudate noted.   NECK:  Supple w/ fair ROM; no JVD; normal carotid impulses w/o bruits; no thyromegaly or nodules palpated; no lymphadenopathy.    RESP  Clear  P & A; w/o, wheezes/ rales/ or rhonchi. no accessory muscle use, no dullness to percussion  CARD:  RRR, no m/r/g, no peripheral edema, pulses intact, no cyanosis or clubbing.  GI:   Soft & nt; nml bowel sounds; no organomegaly or masses detected.   Musco: Warm bil, no deformities or joint swelling noted.   Neuro: alert, no focal deficits noted.    Skin: Warm, no lesions or rashes    Lab Results:  CBC   BNP No results found for: BNP  ProBNP No results found for: PROBNP  Imaging: DG Chest 2 View  Result Date: 12/18/2020 CLINICAL DATA:  History of COVID in December 2020 with persistent cough and shortness of breath EXAM: CHEST - 2 VIEW COMPARISON:  07/16/2020 FINDINGS: The heart size and mediastinal contours are within normal limits. Mild patchy atelectasis/scarring at the lung bases. No pleural effusion. The visualized skeletal structures are unremarkable. IMPRESSION: Mild patchy atelectasis/scarring at the lung bases. Electronically Signed   By: Macy Mis M.D.   On: 12/18/2020 16:50      PFT Results Latest Ref Rng & Units 05/30/2020  DLCO uncorrected ml/min/mmHg 21.74  DLCO UNC% % 103  DLVA Predicted % 106  TLC L 10.16  TLC % Predicted % 168  RV % Predicted % 285    No results found for: NITRICOXIDE      Assessment & Plan:   No problem-specific Assessment & Plan notes found for this encounter.     Rexene Edison, NP 12/25/2020

## 2020-12-29 DIAGNOSIS — J8410 Pulmonary fibrosis, unspecified: Secondary | ICD-10-CM | POA: Insufficient documentation

## 2020-12-29 NOTE — Assessment & Plan Note (Signed)
Upper airway cough-/chronic cough  Plan  Patient Instructions  Prednisone taper over next week . Begin Delsym 2 tsp Twice daily  For cough As needed   Begin Tessalon Three times a day  For cough As needed   Albuterol inhaler 1-2 puffs every 6hrs as needed for wheezing /shortness of breath.  Flutter valve Three times a day  For 2 weeks and then As needed   Follow up with Dr. Vaughan Browner in 4 weeks and As needed   Please contact office for sooner follow up if symptoms do not improve or worsen or seek emergency care

## 2020-12-29 NOTE — Assessment & Plan Note (Signed)
Postinflammatory fibrosis noted on high-resolution CT chest.  Compared to February 2021 CT groundglass opacities and reticulation have improved.  Clinically patient has improved but continues to have some ongoing post viral cough. We will treat with short course of steroids and cough control regimen if not improving will need further evaluation and treatment options  Plan  Patient Instructions  Prednisone taper over next week . Begin Delsym 2 tsp Twice daily  For cough As needed   Begin Tessalon Three times a day  For cough As needed   Albuterol inhaler 1-2 puffs every 6hrs as needed for wheezing /shortness of breath.  Flutter valve Three times a day  For 2 weeks and then As needed   Follow up with Dr. Vaughan Browner in 4 weeks and As needed   Please contact office for sooner follow up if symptoms do not improve or worsen or seek emergency care

## 2021-01-02 ENCOUNTER — Telehealth: Payer: Self-pay | Admitting: Pharmacist

## 2021-01-02 NOTE — Chronic Care Management (AMB) (Signed)
Chronic Care Management Pharmacy Assistant   Name: David Goodman  MRN: [id number] DOB: [birth year]  Reason for Encounter: Medication Review/Initial Questions for Pharmacist visit on 01-05-2021  Patient Questions: 1. Have you seen any other providers since your last visit? No 2. Any changes in your medications or health? No 3. Any side effects from any medications? No 4. Do you have any symptoms or problems not managed by your medications?  5. Any concerns about your health right now? No 6. Has your provider asked that you check blood pressure, blood sugar, or follow a special diet at home? No 7. Do you get any type of exercise regularly?   Yes, he still works 8. Can you think of a goal you would like to reach for your health? No 9. Do you have any problems getting your medications? No 10. Is there anything that you would like to discuss during the appointment? No  The patient was asked to please bring medications, blood pressure/ blood sugar log, and supplements to his appointment.   PCP : Isaac Bliss, Rayford Halsted, MD  Allergies:  No Known Allergies  Medications: Outpatient Encounter Medications as of 01/02/2021  Medication Sig   acetaminophen (TYLENOL) 500 MG tablet Take 1 tablet (500 mg total) by mouth every 6 (six) hours as needed. For use AFTER surgery   benzonatate (TESSALON) 100 MG capsule Take 100 mg by mouth 3 (three) times daily.   benzonatate (TESSALON) 200 MG capsule Take 1 capsule (200 mg total) by mouth 3 (three) times daily as needed for cough.   doxycycline (VIBRAMYCIN) 100 MG capsule Take 1 capsule (100 mg total) by mouth 2 (two) times daily.   fluticasone (FLONASE) 50 MCG/ACT nasal spray Place 2 sprays into both nostrils daily.   latanoprost (XALATAN) 0.005 % ophthalmic solution Place 1 drop into both eyes at bedtime.   losartan (COZAAR) 100 MG tablet Take 1 tablet by mouth once daily   MAGNESIUM PO Take 1 tablet by mouth daily.   naproxen (NAPROSYN)  500 MG tablet TAKE 1 TABLET BY MOUTH TWO  TIMES DAILY WITH MEALS   omeprazole (PRILOSEC) 20 MG capsule TAKE 1 CAPSULE BY MOUTH  DAILY   predniSONE (DELTASONE) 10 MG tablet 4 tabs for 2 days, then 3 tabs for 2 days, 2 tabs for 2 days, then 1 tab for 2 days, then stop   tadalafil (CIALIS) 20 MG tablet Take 1 tablet (20 mg total) by mouth daily as needed for erectile dysfunction.   tamsulosin (FLOMAX) 0.4 MG CAPS capsule TAKE 1 CAPSULE BY MOUTH  DAILY   XIIDRA 5 % SOLN Place 1 drop into both eyes at bedtime.    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   Viral illness 12/17/2020   Cough 12/17/2020   Acute bronchitis 12/11/2020   Lower GI bleed 07/18/2020   Hemorrhoids 07/17/2020   Acute lower GI bleeding 07/16/2020   RLS (restless legs syndrome) 10/17/2019   Acute GI bleeding 02/13/2019   Syncope 11/15/2018   Diverticulitis 05/25/2018   Acute blood loss anemia 05/25/2018   GI bleeding 05/24/2018   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

## 2021-01-05 ENCOUNTER — Ambulatory Visit: Payer: Medicare Other | Admitting: Pharmacist

## 2021-01-05 ENCOUNTER — Other Ambulatory Visit: Payer: Self-pay

## 2021-01-05 DIAGNOSIS — E785 Hyperlipidemia, unspecified: Secondary | ICD-10-CM

## 2021-01-05 DIAGNOSIS — I10 Essential (primary) hypertension: Secondary | ICD-10-CM

## 2021-01-05 DIAGNOSIS — N400 Enlarged prostate without lower urinary tract symptoms: Secondary | ICD-10-CM

## 2021-01-05 NOTE — Progress Notes (Signed)
Chronic Care Management Pharmacy  Name: David Goodman  MRN: [id number] DOB: Feb 01, 1941  Initial Planning Appointment: completed 01/02/21  Initial Questions: 1. Have you seen any other providers since your last visit? n/a 2. Any changes in your medicines or health? No   Chief Complaint/ HPI  David Goodman,  80 y.o. , male presents for their Initial CCM visit with the clinical pharmacist In office.  PCP : Isaac Bliss, Rayford Halsted, MD  Their chronic conditions include: HTN, HLD, GERD, BPH, ED, allergic rhinitis  Office Visits: -12/24/20 Lelon Frohlich, MD: Patient presented for telephone visit for suspected COVID 70. Advised for patient to visit ED.  -12/09/20 Lelon Frohlich, MD: Patient presented for telephone visit for flu like symptoms. Prescribed tussionex.  -07/30/20 Lelon Frohlich, MD: Patient presented for post hospital follow up.   Consult Visit: -12/25/20 Rexene Edison, NP (pulmonary): Patient presented with bronchitis. Prescribed prednisone taper and benzonatate.  -12/17/2020 Lazaro Arms, NP (post COVID care clinic): Patient presented with cough. Prescribed prednisone and promethazine-DM.  -12/10/2020 Unice Cobble, MD (post [facility]): Patient presented with cough. Tested negative for COVID.  -12/03/20 Roetta Sessions, PA-C (plastic surgery): Patient presented for post op follow up for dermatochalasis of both upper eyelids.  -11/19/20 Mingo Amber, MD (plastic surgery): Patient presented for post op follow up for dermatochalasis of both upper eyelids.  -11/10/20 Mingo Amber, MD: Patient presented for blepharoplasty.  -10/30/20 Phoebe Sharps, PA-C (plastic surgery): Patient presented for pre-op exam.   -07/23/20 Melony Overly, MD (otolaryngology): Patient presented for dizziness and cerumen impaction removal.  -8/18-8/20/21 Patient admitted for acute lower GI bleeding and anemia.  Medications: Outpatient Encounter Medications as  of 01/05/2021  Medication Sig  . acetaminophen (TYLENOL) 500 MG tablet Take 1 tablet (500 mg total) by mouth every 6 (six) hours as needed. For use AFTER surgery  . fluticasone (FLONASE) 50 MCG/ACT nasal spray Place 2 sprays into both nostrils daily.  Marland Kitchen latanoprost (XALATAN) 0.005 % ophthalmic solution Place 1 drop into both eyes at bedtime.  Marland Kitchen losartan (COZAAR) 100 MG tablet Take 1 tablet by mouth once daily  . MAGNESIUM PO Take 1 tablet by mouth daily.  . naproxen (NAPROSYN) 500 MG tablet TAKE 1 TABLET BY MOUTH TWO  TIMES DAILY WITH MEALS  . tamsulosin (FLOMAX) 0.4 MG CAPS capsule TAKE 1 CAPSULE BY MOUTH  DAILY  . XIIDRA 5 % SOLN Place 1 drop into both eyes at bedtime.   . [DISCONTINUED] omeprazole (PRILOSEC) 20 MG capsule TAKE 1 CAPSULE BY MOUTH  DAILY  . tadalafil (CIALIS) 20 MG tablet Take 1 tablet (20 mg total) by mouth daily as needed for erectile dysfunction. (Patient not taking: Reported on 01/05/2021)  . [DISCONTINUED] benzonatate (TESSALON) 100 MG capsule Take 100 mg by mouth 3 (three) times daily.  . [DISCONTINUED] benzonatate (TESSALON) 200 MG capsule Take 1 capsule (200 mg total) by mouth 3 (three) times daily as needed for cough.  . [DISCONTINUED] doxycycline (VIBRAMYCIN) 100 MG capsule Take 1 capsule (100 mg total) by mouth 2 (two) times daily.  . [DISCONTINUED] predniSONE (DELTASONE) 10 MG tablet 4 tabs for 2 days, then 3 tabs for 2 days, 2 tabs for 2 days, then 1 tab for 2 days, then stop   Facility-Administered Encounter Medications as of 01/05/2021  Medication  . albuterol (VENTOLIN HFA) 108 (90 Base) MCG/ACT inhaler 2 puff   Patient gets up at 7 and goes to works by 9:30. 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 He currently lives with his wife and does not have any family nearby. He use to play golf 2-3 times a week but his knee pain has stopped him from being able to.   He joined a gym which is 4 blocks away from his  house but he hasn't been in last couple of months due to the weather. He uses a bicycle there and some other muscle strengthening machines. He used to go on Mondays, Wednesdays and Fridays before work.   His wife usually cooks [place] food and they have been getting take out more often. He is originally from [place] and his wife is from [place]. She plans to retire soon. The only family he sees is his grandson in law often.  Patient reports he dreams a lot when he sleeps and this has been going on for a long time. He wants to try sleep therapy. He also wakes up to go to bathroom once or twice a night and sometimes feels tired during the day.  Patient is not feeling great right now with his medications and wants to do an EKG.  Current Diagnosis/Assessment:  Care Plan : [place]  Updates made by Viona Gilmore, [place] since 01/15/2021 12:00 AM    Problem: Problem: HTN, HLD, GERD, BPH, ED, allergic rhintis     Long-Range Goal: Patient-Specific Goal   Start Date: 01/05/2021  Expected End Date: 01/05/2022  This Visit's Progress: On track  Priority: High  Note:   Current Barriers:  . Unable to independently monitor therapeutic efficacy  Pharmacist Clinical Goal(s):  Marland Kitchen Over the next 120 days, patient will achieve control of blood pressure as evidenced by at home blood pressure monitoring  through collaboration with PharmD and provider.   Interventions: . 1:1 collaboration with Isaac Bliss, Rayford Halsted, MD regarding development and update of comprehensive plan of care as evidenced by provider attestation and co-signature . Inter-disciplinary care team collaboration (see longitudinal plan of care) . Comprehensive medication review performed; medication list updated in electronic medical record  Hypertension (BP goal <140/90) -uncontrolled -Current treatment: . Losartan 100 mg 1 tablet daily -Medications previously tried: none  -Current home readings: 132/62 -Current dietary habits:  patient does not pay much attention to sodium content for package labels -Current exercise habits: patient does not currently exercise regularly -Denies hypotensive/hypertensive symptoms -Educated on Daily salt intake goal < 2300 mg; Exercise goal of 150 minutes per week; Importance of home blood pressure monitoring; -Counseled to monitor BP at home weekly, document, and provide log at future appointments -Counseled on diet and exercise extensively Recommended to continue current medication  Hyperlipidemia: (LDL goal < 100) -uncontrolled -Current treatment: . No medications -Medications previously tried: none  -Current dietary patterns: discussed limiting alcohol to no more than 2 glasses per night -Current exercise habits: patient does not exercise consistently -Educated on Importance of limiting foods high in cholesterol; Exercise goal of 150 minutes per week; -Counseled on diet and exercise extensively  BPH (Goal: minimize symptoms of enlarged prostate) -controlled -Current treatment  . Tamsulosin 0.4 mg 1 capsule daily -Medications previously tried: none  -Counseled on taking the medication 1/2 hour to 1 hour after eating to avoid low blood pressures  Health Maintenance -Vaccine gaps: Shingrix -Current therapy:  . Tylenol as needed . Magnesium daily . Zinc 50 mg 1 tablet daily . Vitamin D 3000 units daily . Vitamin C 1000 mg daily . Omega 3 fish oil  1000 mg daily . Probiotic daily  . Multivitamin daily . Naproxen 2 daily -Educated on Herbal supplement research is limited and benefits usually cannot be proven Cost vs benefit of each product must be carefully weighed by individual consumer -Patient is satisfied with current therapy and denies issues  Patient Goals/Self-Care Activities . Over the next 120 days, patient will:  - take medications as prescribed check blood pressure at least weekly, document, and provide at future appointments target a minimum of 150  minutes of moderate intensity exercise weekly  Follow Up Plan: Telephone follow up appointment with care management team member scheduled for: 4 months       SDOH Interventions   Flowsheet Row Most Recent Value  SDOH Interventions   Financial Strain Interventions Intervention Not Indicated  Transportation Interventions Intervention Not Indicated       Hypertension   BP goal is:  <140/90  Office blood pressures are  BP Readings from Last 3 Encounters:  12/25/20 (!) 160/80  12/10/20 101/62  12/03/20 (!) 170/91   Patient checks BP at home daily Patient home BP readings are ranging: 132/62  Patient has failed these meds in the past: none Patient is currently controlled on the following medications:  . Losartan 100 mg 1 tablet daily  We discussed diet and exercise extensively DASH eating plan recommendations: . Emphasizes vegetables, fruits, and whole-grains . Includes fat-free or low-fat dairy products, fish, poultry, beans, nuts, and vegetable oils . Limits foods that are high in saturated fat. These foods include fatty meats, full-fat dairy products, and tropical oils such as coconut, palm kernel, and palm oils. . Limits sugar-sweetened beverages and sweets . Limiting sodium intake to < 1500 mg/day -Patient gained about 6-7 lbs with prednisone -Exercise: walking in the warehouses -Alcohol intake: patient drinks a glass or two of wine a couple times a week  - recommended limiting to 2  Plan  Continue current medications   Hyperlipidemia   LDL goal < 100  Last lipids Lab Results  Component Value Date   CHOL 174 10/31/2018   HDL 37.30 (L) 10/31/2018   LDLCALC 99 10/31/2018   LDLDIRECT 119.0 09/21/2016   TRIG 193.0 (H) 10/31/2018   CHOLHDL 5 10/31/2018   Hepatic Function Latest Ref Rng & Units 07/16/2020 03/06/2020 05/02/2019  Total Protein 6.5 - 8.1 g/dL 5.1(L) 6.6 6.7  Albumin 3.5 - 5.0 g/dL 3.3(L) 4.5 4.4  AST 15 - 41 U/L $Remo'17 17 15  'RazoD$ ALT 0 - 44 U/L $Remo'19 20 22   'LKABs$ Alk Phosphatase 38 - 126 U/L 40 61 48  Total Bilirubin 0.3 - 1.2 mg/dL 0.8 0.5 0.4  Bilirubin, Direct 0.0 - 0.3 mg/dL - - -     The 10-year ASCVD risk score Mikey Bussing DC Jr., et al., 2013) is: 49.5%   Values used to calculate the score:     Age: 73 years     Sex: Male     Is Non-Hispanic African American: No     Diabetic: No     Tobacco smoker: No     Systolic Blood Pressure: 196 mmHg     Is BP treated: Yes     HDL Cholesterol: 37.3 mg/dL     Total Cholesterol: 174 mg/dL   Patient has failed these meds in past: none Patient is currently uncontrolled on the following medications:  . No medications  We discussed:  diet and exercise extensively  -Discussed how triglycerides can increase when: . Eating eat too much food . Eating high-fat foods  such as fried foods, red meat, chicken skin, egg yolks, high-fat dairy, butter, lard, shortening, margarine, and fast food . Eating foods high in simple carbohydrates such as fresh and canned fruit, candy, ice cream and sweetened yogurt, sweetened drinks like juices, cereal, jams, foods and drinks with corn syrup, or sugar listed as the first ingredient . Drinking alcohol    Plan Recommend at least moderate intensity statin therapy due to ASCVD risk and elevated LDL.    GERD/PUD   Patient has failed these meds in past: none Patient is currently controlled on the following medications:  . Omeprazole 20 mg 1 capsule daily  We discussed:  Non-pharmacologic management of symptoms such as elevating the head of your bed, avoiding eating 2-3 hours before bed, avoiding triggering foods such as acidic, spicy, or fatty foods, eating smaller meals, and wearing clothes that are loose around the waist   Plan  Continue current medications   BPH   PSA  Date Value Ref Range Status  10/10/2017 1.17 0.10 - 4.00 ng/mL Final    Comment:    Test performed using Access Hybritech PSA Assay, a parmagnetic partical, chemiluminecent immunoassay.   12/08/2016 0.99 0.10 - 4.00 ng/mL Final  07/28/2015 0.90 0.10 - 4.00 ng/mL Final     Patient has failed these meds in past: none Patient is currently controlled on the following medications:  . Tamsulosin 0.4 mg 1 capsule daily  We discussed:  Recommended taking 1/2 hour after eating to avoid orthostatic hypotension  Plan  Continue current medications   ED   Patient has failed these meds in past: none Patient is currently controlled on the following medications:  . Tadalafil 20 mg 1 tablet as needed   Plan  Continue current medications    Allergic rhinitis   Patient has failed these meds in past: none Patient is currently controlled on the following medications:  . Fluticasone 50 mcg/act 2 sprays in both nostrils daily  We discussed: avoiding allergy triggers   Plan  Continue current medications     Dry eye   Patient has failed these meds in past: none Patient is currently controlled on the following medications:  . Xiidra 5% 1 drop in both eyes at bedtime PRN . Latanoprost 0.005% ophthalmic solution 1 drop in both eyes at bedtime  Plan  Continue current medications   Miscellaneous   Patient is currently on the following medications:  Marland Kitchen Metamucil PRN . Naproxen 2 daily - 1 tablet BID . Tylenol as needed . Magnesium daily . Zinc 50 mg 1 tablet daily . Vitamin D 3000 units daily . Vitamin C 1000 mg daily . Omega 3 fish oil 1000 mg daily . Probiotic daily  . Multivitamin daily  We discussed:  Drinking plenty of water and getting fiber in diet  Plan  Continue current medications   Vaccines   Reviewed and discussed patient's vaccination history.    Immunization History  Administered Date(s) Administered  . Fluad Quad(high Dose 65+) 08/10/2019  . Influenza, High Dose Seasonal PF 07/31/2015, 09/27/2016, 10/10/2017, 09/30/2018  . Influenza,inj,quad, With Preservative 09/30/2018  . Influenza-Unspecified 09/14/2017, 09/30/2018, 10/28/2020  .  PFIZER(Purple Top)SARS-COV-2 Vaccination 01/21/2020, 02/22/2020, 10/28/2020  . Pneumococcal Conjugate-13 05/19/2015  . Pneumococcal Polysaccharide-23 07/16/2009, 09/30/2018  . Pneumococcal-Unspecified 09/30/2018  . Td 07/16/2009  . Tdap 05/19/2015  . Zoster 04/19/2011    Plan  Recommended patient receive Shingrix vaccine at pharmacy.   Medication Management   Patient's preferred pharmacy is:  [facility] [address], [place], [place] [address] Phone: [phone] Fax: [phone]  [place], [place] [address] Phone: [phone] Fax: [phone]  Uses pill box? Yes Pt endorses 95% compliance   We discussed: Current pharmacy is preferred with insurance plan and patient is satisfied with pharmacy services  Plan  Continue current medication management strategy   Follow up: 4 month phone visit  Jeni Salles, PharmD [facility] Pharmacist [facility] [place] [phone]

## 2021-01-05 NOTE — Patient Instructions (Addendum)
Hi David Goodman, It was lovely getting to meet you! Below is a summary of some of the topics we discussed. Please give me a call if you need anything before our follow up!  Best, Maddie  Jeni Salles, PharmD [facility] Clinical Pharmacist [place] [facility] [place]    Visit Information  Patient Care Plan: CCM Pharmacy Care Plan    Problem Identified: Problem: HTN, HLD, GERD, BPH, ED, allergic rhintis     Long-Range Goal: Patient-Specific Goal   Start Date: 01/05/2021  Expected End Date: 01/05/2022  This Visit's Progress: On track  Priority: High  Note:   Current Barriers:  . Unable to independently monitor therapeutic efficacy  Pharmacist Clinical Goal(s):  Marland Kitchen Over the next 120 days, patient will achieve control of blood pressure as evidenced by at home blood pressure monitoring  through collaboration with PharmD and provider.   Interventions: . 1:1 collaboration with Isaac Bliss, Rayford Halsted, MD regarding development and update of comprehensive plan of care as evidenced by provider attestation and co-signature . Inter-disciplinary care team collaboration (see longitudinal plan of care) . Comprehensive medication review performed; medication list updated in electronic medical record  Hypertension (BP goal <140/90) -uncontrolled -Current treatment: . Losartan 100 mg 1 tablet daily -Medications previously tried: none  -Current home readings: 132/62 -Current dietary habits: patient does not pay much attention to sodium content for package labels -Current exercise habits: patient does not currently exercise regularly -Denies hypotensive/hypertensive symptoms -Educated on Daily salt intake goal < 2300 mg; Exercise goal of 150 minutes per week; Importance of home blood pressure monitoring; -Counseled to monitor BP at home weekly, document, and provide log at future appointments -Counseled on diet and exercise extensively Recommended to continue current  medication  Hyperlipidemia: (LDL goal < 100) -uncontrolled -Current treatment: . No medications -Medications previously tried: none  -Current dietary patterns: discussed limiting alcohol to no more than 2 glasses per night -Current exercise habits: patient does not exercise consistently -Educated on Importance of limiting foods high in cholesterol; Exercise goal of 150 minutes per week; -Counseled on diet and exercise extensively  BPH (Goal: minimize symptoms of enlarged prostate) -controlled -Current treatment  . Tamsulosin 0.4 mg 1 capsule daily -Medications previously tried: none  -Counseled on taking the medication 1/2 hour to 1 hour after eating to avoid low blood pressures  Health Maintenance -Vaccine gaps: Shingrix -Current therapy:  . Tylenol as needed . Magnesium daily . Zinc 50 mg 1 tablet daily . Vitamin D 3000 units daily . Vitamin C 1000 mg daily . Omega 3 fish oil 1000 mg daily . Probiotic daily  . Multivitamin daily . Naproxen 2 daily -Educated on Herbal supplement research is limited and benefits usually cannot be proven Cost vs benefit of each product must be carefully weighed by individual consumer -Patient is satisfied with current therapy and denies issues  Patient Goals/Self-Care Activities . Over the next 120 days, patient will:  - take medications as prescribed check blood pressure at least weekly, document, and provide at future appointments target a minimum of 150 minutes of moderate intensity exercise weekly  Follow Up Plan: Telephone follow up appointment with care management team member scheduled for: 4 months      David Goodman was given information about Chronic Care Management services today including:  1. CCM service includes personalized support from designated clinical staff supervised by his physician, including individualized plan of care and coordination with other care providers 2. 24/7 contact phone numbers for assistance for urgent  and  routine care needs. 3. Standard insurance, coinsurance, copays and deductibles apply for chronic care management only during months in which we provide at least 20 minutes of these services. Most insurances cover these services at 100%, however patients may be responsible for any copay, coinsurance and/or deductible if applicable. This service may help you avoid the need for more expensive face-to-face services. 4. Only one practitioner may furnish and bill the service in a calendar month. 5. The patient may stop CCM services at any time (effective at the end of the month) by phone call to the office staff.  Patient agreed to services and verbal consent obtained.   The patient verbalized understanding of instructions, educational materials, and care plan provided today and agreed to receive a mailed copy of patient instructions, educational materials, and care plan.  Telephone follow up appointment with pharmacy team member scheduled for: 4 months  Viona Gilmore, [facility]   [email or web address]">  DASH Eating Plan DASH stands for Dietary Approaches to Stop Hypertension. The DASH eating plan is a healthy eating plan that has been shown to:  Reduce high blood pressure (hypertension).  Reduce your risk for type 2 diabetes, heart disease, and stroke.  Help with weight loss. What are tips for following this plan? Reading food labels  Check food labels for the amount of salt (sodium) per serving. Choose foods with less than 5 percent of the Daily Value of sodium. Generally, foods with less than 300 milligrams (mg) of sodium per serving fit into this eating plan.  To find whole grains, look for the word "whole" as the first word in the ingredient list. Shopping  Buy products labeled as "low-sodium" or "no salt added."  Buy fresh foods. Avoid canned foods and pre-made or frozen meals. Cooking  Avoid adding salt when cooking. Use salt-free  seasonings or herbs instead of table salt or sea salt. Check with your health care provider or pharmacist before using salt substitutes.  Do not fry foods. Cook foods using healthy methods such as baking, boiling, grilling, roasting, and broiling instead.  Cook with heart-healthy oils, such as olive, canola, avocado, soybean, or sunflower oil. Meal planning  Eat a balanced diet that includes: ? 4 or more servings of fruits and 4 or more servings of vegetables each day. Try to fill one-half of your plate with fruits and vegetables. ? 6-8 servings of whole grains each day. ? Less than 6 oz (170 g) of lean meat, poultry, or fish each day. A 3-oz (85-g) serving of meat is about the same size as a deck of cards. One egg equals 1 oz (28 g). ? 2-3 servings of low-fat dairy each day. One serving is 1 cup (237 mL). ? 1 serving of nuts, seeds, or beans 5 times each week. ? 2-3 servings of heart-healthy fats. Healthy fats called omega-3 fatty acids are found in foods such as walnuts, flaxseeds, fortified milks, and eggs. These fats are also found in cold-water fish, such as sardines, salmon, and mackerel.  Limit how much you eat of: ? Canned or prepackaged foods. ? Food that is high in trans fat, such as some fried foods. ? Food that is high in saturated fat, such as fatty meat. ? Desserts and other sweets, sugary drinks, and other foods with added sugar. ? Full-fat dairy products.  Do not salt foods before eating.  Do not eat more than 4 egg yolks a week.  Try to eat at least 2 vegetarian meals a week.  Eat more  home-cooked food and less restaurant, buffet, and fast food.   Lifestyle  When eating at a restaurant, ask that your food be prepared with less salt or no salt, if possible.  If you drink alcohol: ? Limit how much you use to:  0-1 drink a day for women who are not pregnant.  0-2 drinks a day for men. ? Be aware of how much alcohol is in your drink. In the [place] one drink equals  one 12 oz bottle of beer (355 mL), one 5 oz glass of wine (148 mL), or one 1 oz glass of hard liquor (44 mL). General information  Avoid eating more than 2,300 mg of salt a day. If you have hypertension, you may need to reduce your sodium intake to 1,500 mg a day.  Work with your health care provider to maintain a healthy body weight or to lose weight. Ask what an ideal weight is for you.  Get at least 30 minutes of exercise that causes your heart to beat faster (aerobic exercise) most days of the week. Activities may include walking, swimming, or biking.  Work with your health care provider or dietitian to adjust your eating plan to your individual calorie needs. What foods should I eat? Fruits All fresh, dried, or frozen fruit. Canned fruit in natural juice (without added sugar). Vegetables Fresh or frozen vegetables (raw, steamed, roasted, or grilled). Low-sodium or reduced-sodium tomato and vegetable juice. Low-sodium or reduced-sodium tomato sauce and tomato paste. Low-sodium or reduced-sodium canned vegetables. Grains Whole-grain or whole-wheat bread. Whole-grain or whole-wheat pasta. Brown rice. Modena Morrow. Bulgur. Whole-grain and low-sodium cereals. Pita bread. Low-fat, low-sodium crackers. Whole-wheat flour tortillas. Meats and other proteins Skinless chicken or [place]. Ground chicken or [place]. Pork with fat trimmed off. Fish and seafood. Egg whites. Dried beans, peas, or lentils. Unsalted nuts, nut butters, and seeds. Unsalted canned beans. Lean cuts of beef with fat trimmed off. Low-sodium, lean precooked or cured meat, such as sausages or meat loaves. Dairy Low-fat (1%) or fat-free (skim) milk. Reduced-fat, low-fat, or fat-free cheeses. Nonfat, low-sodium ricotta or cottage cheese. Low-fat or nonfat yogurt. Low-fat, low-sodium cheese. Fats and oils Soft margarine without trans fats. Vegetable oil. Reduced-fat, low-fat, or light mayonnaise and salad dressings (reduced-sodium).  Canola, safflower, olive, avocado, soybean, and sunflower oils. Avocado. Seasonings and condiments Herbs. Spices. Seasoning mixes without salt. Other foods Unsalted popcorn and pretzels. Fat-free sweets. The items listed above may not be a complete list of foods and beverages you can eat. Contact a dietitian for more information. What foods should I avoid? Fruits Canned fruit in a light or heavy syrup. Fried fruit. Fruit in cream or butter sauce. Vegetables Creamed or fried vegetables. Vegetables in a cheese sauce. Regular canned vegetables (not low-sodium or reduced-sodium). Regular canned tomato sauce and paste (not low-sodium or reduced-sodium). Regular tomato and vegetable juice (not low-sodium or reduced-sodium). Angie Fava. Olives. Grains Baked goods made with fat, such as croissants, muffins, or some breads. Dry pasta or rice meal packs. Meats and other proteins Fatty cuts of meat. Ribs. Fried meat. Berniece Salines. Bologna, salami, and other precooked or cured meats, such as sausages or meat loaves. Fat from the back of a pig (fatback). Bratwurst. Salted nuts and seeds. Canned beans with added salt. Canned or smoked fish. Whole eggs or egg yolks. Chicken or [place] with skin. Dairy Whole or 2% milk, cream, and half-and-half. Whole or full-fat cream cheese. Whole-fat or sweetened yogurt. Full-fat cheese. Nondairy creamers. Whipped toppings. Processed cheese and cheese spreads.  Fats and oils Butter. Stick margarine. Lard. Shortening. Ghee. Bacon fat. Tropical oils, such as coconut, palm kernel, or palm oil. Seasonings and condiments Onion salt, garlic salt, seasoned salt, table salt, and sea salt. Worcestershire sauce. Tartar sauce. Barbecue sauce. Teriyaki sauce. Soy sauce, including reduced-sodium. Steak sauce. Canned and packaged gravies. Fish sauce. Oyster sauce. Cocktail sauce. Store-bought horseradish. Ketchup. Mustard. Meat flavorings and tenderizers. Bouillon cubes. Hot sauces. Pre-made or  packaged marinades. Pre-made or packaged taco seasonings. Relishes. Regular salad dressings. Other foods Salted popcorn and pretzels. The items listed above may not be a complete list of foods and beverages you should avoid. Contact a dietitian for more information. Where to find more information  National Heart, Lung, and Blood Institute: https://wilson-eaton.com/  American Heart Association: www.heart.org  Academy of Nutrition and Dietetics: www.eatright.[place]: www.kidney.org Summary  The DASH eating plan is a healthy eating plan that has been shown to reduce high blood pressure (hypertension). It may also reduce your risk for type 2 diabetes, heart disease, and stroke.  When on the DASH eating plan, aim to eat more fresh fruits and vegetables, whole grains, lean proteins, low-fat dairy, and heart-healthy fats.  With the DASH eating plan, you should limit salt (sodium) intake to 2,300 mg a day. If you have hypertension, you may need to reduce your sodium intake to 1,500 mg a day.  Work with your health care provider or dietitian to adjust your eating plan to your individual calorie needs. This information is not intended to replace advice given to you by your health care provider. Make sure you discuss any questions you have with your health care provider. Document Revised: 10/19/2019 Document Reviewed: 10/19/2019 Elsevier Patient Education  2021 [employer].

## 2021-01-06 ENCOUNTER — Telehealth: Payer: Self-pay | Admitting: Internal Medicine

## 2021-01-06 NOTE — Telephone Encounter (Signed)
Is it okay to transfer this patient to [facility]?   Patient would like to be closer to home

## 2021-01-06 NOTE — Telephone Encounter (Signed)
Ok to transfer. 

## 2021-01-07 NOTE — Telephone Encounter (Signed)
[place] for transfer. I wish him well!

## 2021-01-07 NOTE — Telephone Encounter (Signed)
[place], Iris Pert is a provider at the [facility], which is closer to Mr Corzine's home.

## 2021-01-07 NOTE — Telephone Encounter (Signed)
Do you know anything about this? Where is [place] and what is [facility]??

## 2021-01-08 ENCOUNTER — Other Ambulatory Visit: Payer: Self-pay | Admitting: Internal Medicine

## 2021-01-09 NOTE — Telephone Encounter (Signed)
Called patient to set up [facility]  Patient said he would call back, he was busy at work

## 2021-01-12 DIAGNOSIS — H401122 Primary open-angle glaucoma, left eye, moderate stage: Secondary | ICD-10-CM | POA: Diagnosis not present

## 2021-01-12 DIAGNOSIS — H02831 Dermatochalasis of right upper eyelid: Secondary | ICD-10-CM | POA: Diagnosis not present

## 2021-01-12 DIAGNOSIS — H04123 Dry eye syndrome of bilateral lacrimal glands: Secondary | ICD-10-CM | POA: Diagnosis not present

## 2021-01-12 DIAGNOSIS — H401111 Primary open-angle glaucoma, right eye, mild stage: Secondary | ICD-10-CM | POA: Diagnosis not present

## 2021-01-21 ENCOUNTER — Telehealth: Payer: Self-pay

## 2021-01-21 NOTE — Telephone Encounter (Signed)
Ok with me 

## 2021-01-21 NOTE — Telephone Encounter (Signed)
Pt called [facility] asking to switch providers to Dr. Gena Fray. Pt states this is closer to his home.  Please advise  Thank you

## 2021-02-04 ENCOUNTER — Ambulatory Visit: Payer: Medicare Other | Admitting: Pulmonary Disease

## 2021-02-12 DIAGNOSIS — M170 Bilateral primary osteoarthritis of knee: Secondary | ICD-10-CM | POA: Diagnosis not present

## 2021-02-27 ENCOUNTER — Encounter: Payer: Self-pay | Admitting: Family Medicine

## 2021-02-27 ENCOUNTER — Ambulatory Visit (INDEPENDENT_AMBULATORY_CARE_PROVIDER_SITE_OTHER): Payer: Medicare Other | Admitting: Family Medicine

## 2021-02-27 ENCOUNTER — Other Ambulatory Visit: Payer: Self-pay

## 2021-02-27 VITALS — BP 136/84 | HR 66 | Temp 97.7°F | Ht 64.0 in | Wt 178.8 lb

## 2021-02-27 DIAGNOSIS — J8410 Pulmonary fibrosis, unspecified: Secondary | ICD-10-CM

## 2021-02-27 DIAGNOSIS — K219 Gastro-esophageal reflux disease without esophagitis: Secondary | ICD-10-CM | POA: Diagnosis not present

## 2021-02-27 DIAGNOSIS — E785 Hyperlipidemia, unspecified: Secondary | ICD-10-CM

## 2021-02-27 DIAGNOSIS — N5201 Erectile dysfunction due to arterial insufficiency: Secondary | ICD-10-CM | POA: Insufficient documentation

## 2021-02-27 DIAGNOSIS — R3916 Straining to void: Secondary | ICD-10-CM | POA: Diagnosis not present

## 2021-02-27 DIAGNOSIS — D500 Iron deficiency anemia secondary to blood loss (chronic): Secondary | ICD-10-CM

## 2021-02-27 DIAGNOSIS — H903 Sensorineural hearing loss, bilateral: Secondary | ICD-10-CM | POA: Insufficient documentation

## 2021-02-27 DIAGNOSIS — N529 Male erectile dysfunction, unspecified: Secondary | ICD-10-CM | POA: Insufficient documentation

## 2021-02-27 DIAGNOSIS — R7302 Impaired glucose tolerance (oral): Secondary | ICD-10-CM | POA: Diagnosis not present

## 2021-02-27 DIAGNOSIS — I519 Heart disease, unspecified: Secondary | ICD-10-CM

## 2021-02-27 DIAGNOSIS — H409 Unspecified glaucoma: Secondary | ICD-10-CM | POA: Insufficient documentation

## 2021-02-27 DIAGNOSIS — H4010X0 Unspecified open-angle glaucoma, stage unspecified: Secondary | ICD-10-CM | POA: Insufficient documentation

## 2021-02-27 DIAGNOSIS — N5239 Other post-surgical erectile dysfunction: Secondary | ICD-10-CM

## 2021-02-27 DIAGNOSIS — N401 Enlarged prostate with lower urinary tract symptoms: Secondary | ICD-10-CM

## 2021-02-27 DIAGNOSIS — I10 Essential (primary) hypertension: Secondary | ICD-10-CM

## 2021-02-27 HISTORY — DX: Iron deficiency anemia secondary to blood loss (chronic): D50.0

## 2021-02-27 LAB — LIPID PANEL
Cholesterol: 209 mg/dL — ABNORMAL HIGH (ref 0–200)
HDL: 52.9 mg/dL (ref >39.00)
NonHDL: 156.23
Total CHOL/HDL Ratio: 4
Triglycerides: 232 mg/dL — ABNORMAL HIGH (ref 0.0–149.0)
VLDL: 46.4 mg/dL — ABNORMAL HIGH (ref 0.0–40.0)

## 2021-02-27 LAB — COMPREHENSIVE METABOLIC PANEL WITH GFR
ALT: 21 U/L (ref 0–53)
AST: 17 U/L (ref 0–37)
Albumin: 4.4 g/dL (ref 3.5–5.2)
Alkaline Phosphatase: 56 U/L (ref 39–117)
BUN: 23 mg/dL (ref 6–23)
CO2: 27 meq/L (ref 19–32)
Calcium: 9.1 mg/dL (ref 8.4–10.5)
Chloride: 101 meq/L (ref 96–112)
Creatinine, Ser: 0.94 mg/dL (ref 0.40–1.50)
GFR: 77.16 mL/min
Glucose, Bld: 110 mg/dL — ABNORMAL HIGH (ref 70–99)
Potassium: 4.4 meq/L (ref 3.5–5.1)
Sodium: 137 meq/L (ref 135–145)
Total Bilirubin: 0.8 mg/dL (ref 0.2–1.2)
Total Protein: 6.7 g/dL (ref 6.0–8.3)

## 2021-02-27 LAB — CBC
HCT: 42 % (ref 39.0–52.0)
Hemoglobin: 14.6 g/dL (ref 13.0–17.0)
MCHC: 34.8 g/dL (ref 30.0–36.0)
MCV: 87.6 fl (ref 78.0–100.0)
Platelets: 254 K/uL (ref 150.0–400.0)
RBC: 4.8 Mil/uL (ref 4.22–5.81)
RDW: 15.7 % — ABNORMAL HIGH (ref 11.5–15.5)
WBC: 10.1 K/uL (ref 4.0–10.5)

## 2021-02-27 LAB — LDL CHOLESTEROL, DIRECT: Direct LDL: 129 mg/dL

## 2021-02-27 LAB — HEMOGLOBIN A1C: Hgb A1c MFr Bld: 5.6 % (ref 4.6–6.5)
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
## 2021-02-28 LAB — IRON,TIBC AND FERRITIN PANEL
%SAT: 42 % (calc) (ref 20–48)
Ferritin: 26 ng/mL (ref 24–380)
Iron: 155 ug/dL (ref 50–180)
TIBC: 368 mcg/dL (calc) (ref 250–425)

## 2021-03-03 ENCOUNTER — Telehealth: Payer: Self-pay | Admitting: Family Medicine

## 2021-03-03 ENCOUNTER — Other Ambulatory Visit: Payer: Self-pay | Admitting: Family Medicine

## 2021-03-03 DIAGNOSIS — I10 Essential (primary) hypertension: Secondary | ICD-10-CM

## 2021-03-03 DIAGNOSIS — E785 Hyperlipidemia, unspecified: Secondary | ICD-10-CM

## 2021-03-03 NOTE — Progress Notes (Signed)
  Chronic Care Management   Note  03/03/2021 Name: Yaiden Yang MRN: [id number] DOB: [birth year]  Arbor Cohen is a 81 y.o. year old male who is a primary care patient of Gena Fray, Lillette Boxer, MD. I reached out to Mannie Stabile by phone today in response to a referral sent by Mr. Eyob Garrod's PCP, Gena Fray Lillette Boxer, MD.   Mr. Walkowski was given information about Chronic Care Management services today including:  1. CCM service includes personalized support from designated clinical staff supervised by his physician, including individualized plan of care and coordination with other care providers 2. 24/7 contact phone numbers for assistance for urgent and routine care needs. 3. Service will only be billed when office clinical staff spend 20 minutes or more in a month to coordinate care. 4. Only one practitioner may furnish and bill the service in a calendar month. 5. The patient may stop CCM services at any time (effective at the end of the month) by phone call to the office staff.   Patient agreed to services and verbal consent obtained.   Follow up plan:   Carley Perdue [employer] Scheduler

## 2021-03-18 ENCOUNTER — Encounter: Payer: Self-pay | Admitting: Family Medicine

## 2021-03-18 ENCOUNTER — Other Ambulatory Visit: Payer: Self-pay | Admitting: Family Medicine

## 2021-03-23 DIAGNOSIS — H401122 Primary open-angle glaucoma, left eye, moderate stage: Secondary | ICD-10-CM | POA: Diagnosis not present

## 2021-03-23 DIAGNOSIS — H401111 Primary open-angle glaucoma, right eye, mild stage: Secondary | ICD-10-CM | POA: Diagnosis not present

## 2021-03-23 DIAGNOSIS — H04123 Dry eye syndrome of bilateral lacrimal glands: Secondary | ICD-10-CM | POA: Diagnosis not present

## 2021-03-26 ENCOUNTER — Encounter: Payer: Medicare Other | Admitting: Family Medicine

## 2021-03-30 ENCOUNTER — Telehealth: Payer: Self-pay

## 2021-03-30 NOTE — Progress Notes (Signed)
Chronic Care Management Pharmacy Assistant   Name: David Goodman  MRN: [id number] DOB: Sep 13, 1941  Reason for Encounter: Chart review for CPP on 03/31/2021   Conditions to be addressed/monitored: HTN and GERD,Impaired glucose tolerance,Osteoarthritis,BPH,Iron deiciency  Primary concerns for visit include: Nothing at this time   Recent office visits:  02/27/2021 Dr.Rudd MD (PCP)-Ambulatory referral to Cardiology 01/05/2021 Jeni Salles CCM -No medication Changes 12/09/2020 Dr.Hernandez MD ([facility] medication Changes 12/09/2020 Dr. Jerilee Hoh MD ([facility])- Started Hydrocod [place] 10-8 MG/5ML  Recent consult visits:  12/25/2020 Tammy Parrett (Pulmonology) - Started Delsym 2 tsp twice daily, started Tessalon three times daily,Started prednisone taper 12/17/2020 Lazaro Arms NP [facility])- Started Prednisone 20 mg daily, promethazine-DM 6.25 mg -15 mg/5ML PRN 12/10/2020 Dr Linna Darner MD ([facility])-No medication changes 12/03/2020 Rodman Key Scheeler PA-C -No medication Changes 11/19/2020 Dr.Pace MD (Plastic Surgery)-No medication Changes 10/30/2020 Phoebe Sharps PA-C (Plastic Surgery) - Started Acetaminophen 500 mg PRN, Started  Hydrocodone-Acetaminophen 5-325 mg PRN, Started Ondansetron 4 mg PRN   Hospital visits:  Medication Reconciliation was completed by comparing discharge summary, patient's EMR and Pharmacy list, and upon discussion with patient.  Admitted to the hospital on 11/10/2020 due to Surgery with dermatochalasis of both upper eyelids. Discharge date was 11/10/2020. Discharged from [facility]?Medications Started at [facility] Discharge:?? -started None   Medication Changes at Hospital Discharge: -Changed None  Medications Discontinued at Hospital Discharge: -Stopped None   Medications that remain the same after Hospital Discharge:??  -All other medications  will remain the same.    Have you seen any other providers since your last visit? no Any changes in your medications or health? no Any side effects from any medications? no Do you have an symptoms or problems not managed by your medications? no Any concerns about your health right now? no Has your provider asked that you check blood pressure, blood sugar, or follow special diet at home? no Do you get any type of exercise on a regular basis? no Can you think of a goal you would like to reach for your health? None ID Do you have any problems getting your medications? no Is there anything that you would like to discuss during the appointment? No  Please bring medications and supplements to appointment  Medications: Outpatient Encounter Medications as of 03/30/2021  Medication Sig  . acetaminophen (TYLENOL) 500 MG tablet Take 1 tablet (500 mg total) by mouth every 6 (six) hours as needed. For use AFTER surgery  . alprostadil (EDEX) 10 MCG injection 10 mcg by Intracavitary route as needed. use no more than 3 times per week  . fluticasone (FLONASE) 50 MCG/ACT nasal spray Place 2 sprays into both nostrils daily.  Marland Kitchen latanoprost (XALATAN) 0.005 % ophthalmic solution Place 1 drop into both eyes at bedtime.  Marland Kitchen losartan (COZAAR) 100 MG tablet Take 1 tablet by mouth once daily  . MAGNESIUM PO Take 1 tablet by mouth daily.  Marland Kitchen omeprazole (PRILOSEC) 20 MG capsule TAKE 1 CAPSULE BY MOUTH  DAILY  . tadalafil (CIALIS) 20 MG tablet Take 1 tablet (20 mg total) by mouth daily as needed for erectile dysfunction.  . tamsulosin (FLOMAX) 0.4 MG CAPS capsule TAKE 1 CAPSULE BY MOUTH  DAILY   Facility-Administered Encounter Medications as of 03/30/2021  Medication  . albuterol (VENTOLIN HFA) 108 (90 Base) MCG/ACT inhaler 2 puff    Star Rating Drugs: Losartan 100 mg last filled on 01/13/2021 for 90  day supply at [facility].  [place] Pharmacist Assistant [phone]

## 2021-03-31 ENCOUNTER — Other Ambulatory Visit: Payer: Self-pay | Admitting: Internal Medicine

## 2021-03-31 ENCOUNTER — Telehealth: Payer: Medicare Other

## 2021-03-31 ENCOUNTER — Other Ambulatory Visit: Payer: Self-pay

## 2021-03-31 NOTE — Progress Notes (Deleted)
Chronic Care Management Pharmacy Note  03/31/2021 Name:  David Goodman MRN:  [id number] DOB:  [birth year]  Subjective: David Goodman is an 80 y.o. year old male who is a primary patient of Rudd, Lillette Boxer, MD.  The CCM team was consulted for assistance with disease management and care coordination needs.    {CCMTELEPHONEFACETOFACE:21091510} for {CCMINITIALFOLLOWUPCHOICE:21091511} in response to provider referral for pharmacy case management and/or care coordination services.   Consent to Services:  {CCMCONSENTOPTIONS:25074}  Patient Care Team: Haydee Salter, MD as PCP - General (Family Medicine) Viona Gilmore, [facility] as Pharmacist (Pharmacist) Germaine Pomfret, [facility] as Pharmacist (Pharmacist)  Recent office visits: 02/27/2021 Dr.Rudd MD (PCP)-Ambulatory referral to Cardiology 01/05/2021 Jeni Salles CCM -No medication Changes 12/09/2020 Dr.Hernandez MD ([facility] medication Changes 12/09/2020 Dr. Jerilee Hoh MD ([facility])- Started Hydrocod Polst- Chlorphen Polst 10-8 MG/5ML  Recent consult visits: 12/25/2020 Tammy Parrett (Pulmonology) - Started Delsym 2 tsp twice daily, started Tessalon three times daily,Started prednisone taper 12/17/2020 Lazaro Arms NP [facility])- Started Prednisone 20 mg daily, promethazine-DM 6.25 mg -15 mg/5ML PRN 12/10/2020 Dr Linna Darner MD ([facility])-No medication changes 12/03/2020 Roetta Sessions PA-C -No medication Changes 11/19/2020 Dr.Pace MD (Plastic Surgery)-No medication Changes 10/30/2020 Phoebe Sharps PA-C (Plastic Surgery) - Started Acetaminophen 500 mg PRN, Started  Hydrocodone-Acetaminophen 5-325 mg PRN, Started Ondansetron 4 mg PRN   Hospital visits: None in previous 6 months  Objective:  Lab Results  Component Value Date   CREATININE 0.94 02/27/2021   BUN 23 02/27/2021   GFR 77.16 02/27/2021   GFRNONAA >60 07/18/2020   GFRAA >60 07/18/2020   NA 137  02/27/2021   K 4.4 02/27/2021   CALCIUM 9.1 02/27/2021   CO2 27 02/27/2021   GLUCOSE 110 (H) 02/27/2021    Lab Results  Component Value Date/Time   HGBA1C 5.6 02/27/2021 09:50 AM   HGBA1C 5.2 07/17/2020 02:12 AM   GFR 77.16 02/27/2021 09:50 AM   GFR 73.03 03/06/2020 08:11 AM    Last diabetic Eye exam: No results found for: HMDIABEYEEXA  Last diabetic Foot exam: No results found for: HMDIABFOOTEX   Lab Results  Component Value Date   CHOL 209 (H) 02/27/2021   HDL 52.90 02/27/2021   LDLCALC 99 10/31/2018   LDLDIRECT 129.0 02/27/2021   TRIG 232.0 (H) 02/27/2021   CHOLHDL 4 02/27/2021    Hepatic Function Latest Ref Rng & Units 02/27/2021 07/16/2020 03/06/2020  Total Protein 6.0 - 8.3 g/dL 6.7 5.1(L) 6.6  Albumin 3.5 - 5.2 g/dL 4.4 3.3(L) 4.5  AST 0 - 37 U/L _0 ALT 0 - 53 U/L _1 Alk Phosphatase 39 - 117 U/L 56 40 61  Total Bilirubin 0.2 - 1.2 mg/dL 0.8 0.8 0.5  Bilirubin, Direct 0.0 - 0.3 mg/dL - - -    Lab Results  Component Value Date/Time   TSH 3.04 05/02/2019 08:18 AM   TSH 2.93 10/31/2018 08:20 AM    CBC Latest Ref Rng & Units 02/27/2021 07/30/2020 07/18/2020  WBC 4.0 - 10.5 K/uL 10.1 9.5 7.5  Hemoglobin 13.0 - 17.0 g/dL 14.6 10.6(L) 9.7(L)  Hematocrit 39.0 - 52.0 % 42.0 31.5(L) 28.4(L)  Platelets 150.0 - 400.0 K/uL 254.0 350 208    Lab Results  Component Value Date/Time   VD25OH 36.47 05/02/2019 08:18 AM   VD25OH 33.23 10/31/2018 08:20 AM    Clinical ASCVD: No  The 10-year ASCVD risk score Mikey Bussing DC Jr., et al., 2013) is: 38.4%  Values used to calculate the score:     Age: 44 years     Sex: Male     Is Non-Hispanic African American: No     Diabetic: No     Tobacco smoker: No     Systolic Blood Pressure: 992 mmHg     Is BP treated: Yes     HDL Cholesterol: 52.9 mg/dL     Total Cholesterol: 209 mg/dL    Depression screen [facility] 2/9 02/27/2021 03/06/2020 11/15/2018  Decreased Interest 0 0 0  Down, Depressed, Hopeless 0 0 0  PHQ - 2 Score 0 0 0   Altered sleeping - 0 -  Tired, decreased energy - 3 -  Change in appetite - 0 -  Feeling bad or failure about yourself  - 0 -  Trouble concentrating - 2 -  Moving slowly or fidgety/restless - 0 -  Suicidal thoughts - 0 -  PHQ-9 Score - 5 -  Difficult doing work/chores - Not difficult at all -    Social History   Tobacco Use  Smoking Status Never Smoker  Smokeless Tobacco Never Used   BP Readings from Last 3 Encounters:  02/27/21 136/84  12/25/20 (!) 160/80  12/10/20 101/62   Pulse Readings from Last 3 Encounters:  02/27/21 66  12/25/20 77  12/24/20 77   Wt Readings from Last 3 Encounters:  02/27/21 178 lb 12.8 oz (81.1 kg)  12/25/20 182 lb 3.2 oz (82.6 kg)  12/24/20 182 lb (82.6 kg)   BMI Readings from Last 3 Encounters:  02/27/21 30.69 kg/m  12/25/20 31.27 kg/m  12/24/20 31.24 kg/m    Assessment/Interventions: Review of patient past medical history, allergies, medications, health status, including review of consultants reports, laboratory and other test data, was performed as part of comprehensive evaluation and provision of chronic care management services.   SDOH:  (Social Determinants of Health) assessments and interventions performed: Yes  SDOH Screenings   Alcohol Screen: Not on file  Depression (PHQ2-9): Low Risk   . PHQ-2 Score: 0  Financial Resource Strain: Low Risk   . Difficulty of Paying Living Expenses: Not hard at all  Food Insecurity: Not on file  Housing: Not on file  Physical Activity: Not on file  Social Connections: Not on file  Stress: Not on file  Tobacco Use: Low Risk   . Smoking Tobacco Use: Never Smoker  . Smokeless Tobacco Use: Never Used  Transportation Needs: No Transportation Needs  . Lack of Transportation (Medical): No  . Lack of Transportation (Non-Medical): No    CCM Care Plan  No Known Allergies  Medications Reviewed Today    Reviewed by Konrad Saha, CMA (Certified Medical Assistant) on 02/27/21 at [phone]  Med  List Status: <None>  Medication Order Taking? Sig Documenting Provider Last Dose Status Informant  acetaminophen (TYLENOL) 500 MG tablet [id number] Yes Take 1 tablet (500 mg total) by mouth every 6 (six) hours as needed. For use AFTER surgery Threasa Heads, PA-C Taking Active   albuterol (VENTOLIN HFA) 108 (90 Base) MCG/ACT inhaler 2 puff [id number]   Scot Jun, FNP  Active   fluticasone [facility]) 50 MCG/ACT nasal spray [id number] Yes Place 2 sprays into both nostrils daily. Isaac Bliss, Rayford Halsted, MD Taking Active Self  latanoprost (XALATAN) 0.005 % ophthalmic solution [id number] Yes Place 1 drop into both eyes at bedtime. Marletta Lor, MD Taking Active Self  losartan (COZAAR) 100 MG tablet [id number] Yes Take 1 tablet by mouth once daily Jerilee Hoh  Everardo Beals, MD Taking Active   MAGNESIUM PO [id number] Yes Take 1 tablet by mouth daily. [provider] Taking Active Self  naproxen (NAPROSYN) 500 MG tablet [id number] Yes TAKE 1 TABLET BY MOUTH TWO  TIMES DAILY WITH MEALS Isaac Bliss, Rayford Halsted, MD Taking Active   omeprazole (PRILOSEC) 20 MG capsule [id number] Yes TAKE 1 CAPSULE BY MOUTH  DAILY Isaac Bliss, Rayford Halsted, MD Taking Active   tadalafil (CIALIS) 20 MG tablet [id number] Yes Take 1 tablet (20 mg total) by mouth daily as needed for erectile dysfunction. Isaac Bliss, Rayford Halsted, MD Taking Active   tamsulosin [facility]) 0.4 MG CAPS capsule [id number] Yes TAKE 1 CAPSULE BY MOUTH  DAILY Isaac Bliss, Rayford Halsted, MD Taking Active   XIIDRA 5 % SOLN [id number]  Place 1 drop into both eyes at bedtime.  [provider]  Active Self          Patient Active Problem List   Diagnosis Date Noted  . Iron deficiency anemia due to chronic blood loss 02/27/2021  . Hypocalcemia 02/27/2021  . Glaucoma 02/27/2021  . 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 Erectile dysfunction due to arterial insufficiency 02/27/2021  . 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 Cough 12/17/2020  . Hemorrhoids 07/17/2020  . RLS (restless legs syndrome) 10/17/2019  . Syncope 11/15/2018  . Diverticulitis 05/25/2018  . Osteoarthritis of right knee 12/13/2017  . Osteoarthritis of left knee 12/13/2017  . 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 Impaired glucose tolerance 09/27/2016  . GERD (gastroesophageal reflux disease) 09/27/2016  . BPH (benign prostatic hyperplasia) 06/18/2013  . Dyslipidemia 07/13/2007  . Essential hypertension 07/13/2007  . Allergic rhinitis 07/13/2007  . Peptic ulcer disease 07/13/2007  . History of nephrolithiasis 07/13/2007    Immunization History  Administered Date(s) Administered  . Fluad Quad(high Dose 65+) 08/10/2019  . Influenza, High Dose Seasonal PF 07/31/2015, 09/27/2016, 10/10/2017, 09/30/2018  . Influenza,inj,quad, With Preservative 09/30/2018  . Influenza-Unspecified 09/14/2017, 09/30/2018, 10/28/2020  . PFIZER(Purple Top)SARS-COV-2 Vaccination 01/21/2020, 02/22/2020, 10/28/2020  . Pneumococcal Conjugate-13 05/19/2015  . Pneumococcal Polysaccharide-23 07/16/2009, 09/30/2018  . Pneumococcal-Unspecified 09/30/2018  . Td 07/16/2009  . Tdap 05/19/2015  . Zoster 04/19/2011    Conditions to be addressed/monitored:  Hypertension, Hyperlipidemia, GERD, Osteoarthritis, BPH and Allergic Rhinitis  There are no care plans that you recently modified to display for this patient.    Medication Assistance: {MEDASSISTANCEINFO:25044}  Patient's preferred pharmacy is:  [facility], [address], [address] [place], [place] [address] Phone: [phone] Fax: [phone]  [facility], [address] [place] [address] Phone: [phone] Fax: [phone]  Uses pill box? {Yes or If no, why not?:20788} Pt  endorses ***% compliance  We discussed: {Pharmacy options:24294} Patient decided to: {US Pharmacy Plan:23885}  Care Plan and Follow Up Patient Decision:  {FOLLOWUP:24991}  Plan: {CM FOLLOW UP PYKD:98338}  ***   Current Barriers:  . {pharmacybarriers:24917}  Pharmacist Clinical Goal(s):  Marland Kitchen Patient will {PHARMACYGOALCHOICES:24921} through collaboration with PharmD and provider.   Interventions: . 1:1 collaboration with Haydee Salter, MD regarding development and update of comprehensive plan of care as evidenced by provider attestation and co-signature . Inter-disciplinary care team collaboration (see longitudinal plan of care) . Comprehensive medication review performed; medication list updated in electronic medical record  Hypertension (BP goal {CHL HP UPSTREAM Pharmacist  BP [phone]}) -{US controlled/uncontrolled:25276} -Current treatment: . Losartan 100 mg daily  -Medications previously tried: ***  -Current home readings: *** -Current dietary habits: *** -Current exercise habits: *** -{ACTIONS;DENIES/REPORTS:21021675::"Denies"} hypotensive/hypertensive symptoms -Educated on {CCM BP Counseling:25124} -Counseled to monitor BP at home ***, document, and provide log at future appointments -{CCMPHARMDINTERVENTION:25122}  Hyperlipidemia: (LDL goal < 100) -Uncontrolled -Current treatment: . None -Medications previously tried: ***  -Current dietary patterns: *** -Current exercise habits: *** -Educated on {CCM HLD Counseling:25126} -{CCMPHARMDINTERVENTION:25122}  GERD (Goal: ***) -{US controlled/uncontrolled:25276} -Current treatment  . Omeprazole 20 mg daily  -Medications previously tried: ***  -{CCMPHARMDINTERVENTION:25122}  Allergic Rhinitis (Goal: ***) -{US controlled/uncontrolled:25276} -Current treatment  . Flonase 50 mcg/ac 2 sprays daily  -Medications previously tried: ***  -{CCMPHARMDINTERVENTION:25122}  *** (Goal: ***) -{US  controlled/uncontrolled:25276} -Current treatment  . Tamsulosin 0.4 mg daily  -Medications previously tried: ***  -{CCMPHARMDINTERVENTION:25122}  Erectile Dysfunction (Goal: ***) -{US controlled/uncontrolled:25276} -Current treatment  . Alprostadil 10 mg injection as needed  . Tadalafil 20 mg daily as needed  -Medications previously tried: ***  -{CCMPHARMDINTERVENTION:25122}    Patient Goals/Self-Care Activities . Patient will:  - {pharmacypatientgoals:24919}  Follow Up Plan: {CM FOLLOW UP OPPU:68166}

## 2021-04-07 ENCOUNTER — Telehealth: Payer: Medicare Other

## 2021-04-22 ENCOUNTER — Other Ambulatory Visit: Payer: Self-pay

## 2021-04-22 ENCOUNTER — Other Ambulatory Visit: Payer: Self-pay | Admitting: Internal Medicine

## 2021-04-22 DIAGNOSIS — I10 Essential (primary) hypertension: Secondary | ICD-10-CM

## 2021-04-22 MED ORDER — LOSARTAN POTASSIUM 100 MG PO TABS: 1.0000 | ORAL_TABLET | Freq: Every day | ORAL | 0 refills | Status: DC

## 2021-04-28 ENCOUNTER — Ambulatory Visit: Payer: Medicare Other | Admitting: Cardiology

## 2021-05-11 ENCOUNTER — Encounter: Payer: Self-pay | Admitting: Family Medicine

## 2021-05-11 ENCOUNTER — Other Ambulatory Visit: Payer: Self-pay

## 2021-05-11 ENCOUNTER — Ambulatory Visit (INDEPENDENT_AMBULATORY_CARE_PROVIDER_SITE_OTHER): Payer: Medicare Other | Admitting: Family Medicine

## 2021-05-11 VITALS — BP 130/76 | HR 72 | Temp 97.1°F | Ht 64.0 in | Wt 177.2 lb

## 2021-05-11 DIAGNOSIS — I10 Essential (primary) hypertension: Secondary | ICD-10-CM | POA: Diagnosis not present

## 2021-05-11 DIAGNOSIS — G478 Other sleep disorders: Secondary | ICD-10-CM | POA: Diagnosis not present

## 2021-05-11 DIAGNOSIS — R5383 Other fatigue: Secondary | ICD-10-CM | POA: Diagnosis not present

## 2021-05-11 LAB — COMPREHENSIVE METABOLIC PANEL
ALT: 14 U/L (ref 0–53)
AST: 15 U/L (ref 0–37)
Albumin: 4.4 g/dL (ref 3.5–5.2)
Alkaline Phosphatase: 57 U/L (ref 39–117)
BUN: 22 mg/dL (ref 6–23)
CO2: 27 mEq/L (ref 19–32)
Calcium: 9.3 mg/dL (ref 8.4–10.5)
Chloride: 100 mEq/L (ref 96–112)
Creatinine, Ser: 1.03 mg/dL (ref 0.40–1.50)
GFR: 69.05 mL/min (ref >60.00)
Glucose, Bld: 93 mg/dL (ref 70–99)
Potassium: 4.6 mEq/L (ref 3.5–5.1)
Sodium: 136 mEq/L (ref 135–145)
Total Bilirubin: 0.7 mg/dL (ref 0.2–1.2)
Total Protein: 6.8 g/dL (ref 6.0–8.3)

## 2021-05-11 LAB — CBC
HCT: 40.6 % (ref 39.0–52.0)
Hemoglobin: 13.8 g/dL (ref 13.0–17.0)
MCHC: 33.9 g/dL (ref 30.0–36.0)
MCV: 87.4 fl (ref 78.0–100.0)
Platelets: 262 10*3/uL (ref 150.0–400.0)
RBC: 4.65 Mil/uL (ref 4.22–5.81)
RDW: 13.5 % (ref 11.5–15.5)
WBC: 8.9 10*3/uL (ref 4.0–10.5)

## 2021-05-11 LAB — TSH: TSH: 3.93 u[IU]/mL (ref 0.35–4.50)

## 2021-05-11 LAB — TESTOSTERONE: Testosterone: 237.33 ng/dL — ABNORMAL LOW (ref 300.00–890.00)
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
## 2021-05-29 ENCOUNTER — Encounter: Payer: Self-pay | Admitting: Family Medicine

## 2021-05-29 ENCOUNTER — Telehealth: Payer: Self-pay

## 2021-05-29 ENCOUNTER — Other Ambulatory Visit: Payer: Self-pay

## 2021-05-29 ENCOUNTER — Ambulatory Visit (INDEPENDENT_AMBULATORY_CARE_PROVIDER_SITE_OTHER): Payer: Medicare Other | Admitting: Family Medicine

## 2021-05-29 VITALS — BP 138/76 | HR 78 | Temp 97.4°F | Ht 64.0 in | Wt 181.2 lb

## 2021-05-29 DIAGNOSIS — F320 Major depressive disorder, single episode, mild: Secondary | ICD-10-CM

## 2021-05-29 DIAGNOSIS — K449 Diaphragmatic hernia without obstruction or gangrene: Secondary | ICD-10-CM

## 2021-05-29 DIAGNOSIS — G478 Other sleep disorders: Secondary | ICD-10-CM | POA: Diagnosis not present

## 2021-05-29 DIAGNOSIS — I10 Essential (primary) hypertension: Secondary | ICD-10-CM

## 2021-05-29 DIAGNOSIS — R5383 Other fatigue: Secondary | ICD-10-CM

## 2021-05-29 MED ORDER — PAROXETINE HCL 10 MG PO TABS: ORAL_TABLET | ORAL | 0 refills | Status: DC

## 2021-05-29 NOTE — Progress Notes (Signed)
[facility] [address] [place] [address] Dept: [phone] Dept Fax: [phone]  Chronic Care Office Visit  Subjective:    [name], male    DOB: October 15, 1941, 80 y.o..   MRN: [id number]  Chief Complaint  Patient presents with   3 month follow up HTN    History of Present Illness:  Patient is in today for reassessment of chronic medical issues. David Goodman has a history of hypertension and prior left ventricular dysfunction. He is currently managed on losartan. He notes that often his blood pressure runs higher when he comes to the doctor's office.  David Goodman continues to complain of fatigue, poor sleep that is non-restorative, and occasional dizziness when bending and standing up. 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 These create worries that are on his mind. He wonders about being on a medication to help with depression.  David Goodman notes that he has a history of a hiatal hernia. This has been more uncomfortable lately. He relates this to doing crunch exercises to improve his abdominal musculature.  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 12/29/2020   Cough 12/17/2020   Hemorrhoids 07/17/2020   RLS (restless legs syndrome) 10/17/2019   Syncope 11/15/2018   Diverticulitis 05/25/2018   Osteoarthritis of right knee 12/13/2017   Osteoarthritis of left knee 12/13/2017   Testosterone deficiency 03/22/2017   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/27/2016   GERD (gastroesophageal reflux disease) 09/27/2016   BPH (benign prostatic hyperplasia) 06/18/2013    Dyslipidemia 07/13/2007   Essential hypertension 07/13/2007   Allergic rhinitis 07/13/2007   Peptic ulcer disease 07/13/2007   History of nephrolithiasis 07/13/2007   Past Surgical History:  Procedure Laterality Date   BROW LIFT Bilateral 11/10/2020   Procedure: BLEPHAROPLASTY;  Surgeon: [name], MD;  Location: [facility];  Service: Plastics;  Laterality: Bilateral;  1 hour   COLONOSCOPY WITH PROPOFOL N/A 02/17/2019   Procedure: COLONOSCOPY WITH PROPOFOL;  Surgeon: Ronnette Juniper, MD;  Location: [facility];  Service: Gastroenterology;  Laterality: N/A;   HYDROCELE EXCISION Right 05/15/2003   IR ANGIOGRAM VISCERAL SELECTIVE  02/16/2019   IR ANGIOGRAM VISCERAL SELECTIVE  02/16/2019   IR ANGIOGRAM VISCERAL SELECTIVE  02/16/2019   IR US GUIDE VASC ACCESS RIGHT  02/16/2019   ROTATOR CUFF REPAIR     SPERMATOCELECTOMY Right 05/15/2003   History reviewed. No pertinent family history.  Outpatient Medications Prior to Visit  Medication Sig Dispense Refill   acetaminophen (TYLENOL) 500 MG tablet Take 1 tablet (500 mg total) by mouth every 6 (six) hours as needed. For use AFTER surgery 30 tablet 0   alprostadil (EDEX) 10 MCG injection 10 mcg by Intracavitary route as needed. use no more than 3 times per week     dorzolamide-timolol (COSOPT) 22.3-6.8 MG/ML ophthalmic solution SMARTSIG:In Eye(s)     fluticasone (FLONASE) 50 MCG/ACT nasal spray Place 2 sprays into both nostrils daily. 16 g 6   latanoprost (XALATAN) 0.005 % ophthalmic solution Place 1 drop into both eyes at bedtime. 7.5 mL 1   losartan (COZAAR) 100 MG tablet Take 1 tablet (100 mg total) by mouth daily. [place]  tablet 0   MAGNESIUM PO Take 1 tablet by mouth daily.     naproxen (NAPROSYN) 500 MG tablet Take 500 mg by mouth 2 (two) times daily.     omeprazole (PRILOSEC) 20 MG capsule TAKE 1 CAPSULE BY MOUTH  DAILY 90 capsule 0   tadalafil (CIALIS) 20 MG tablet Take 1 tablet (20 mg total) by mouth daily as needed for  erectile dysfunction. 30 tablet 2   tamsulosin (FLOMAX) 0.4 MG CAPS capsule TAKE 1 CAPSULE BY MOUTH  DAILY 90 capsule 3   Facility-Administered Medications Prior to Visit  Medication Dose Route Frequency Provider Last Rate Last Admin   albuterol (VENTOLIN HFA) 108 (90 Base) MCG/ACT inhaler 2 puff  2 puff Inhalation Q4H PRN Scot Jun, FNP       No Known Allergies    Objective:   Today's Vitals   05/29/21 0820  BP: 138/76  Pulse: 78  Temp: (!) 97.4 F (36.3 C)  SpO2: 97%  Weight: 181 lb 3.2 oz (82.2 kg)  Height: 5\' 4"  (1.626 m)   Body mass index is 31.1 kg/m.   General: Well developed, well nourished. No acute distress. Psych: Alert and oriented. Depressed mood with flat affect.  Health Maintenance Due  Topic Date Due   Hepatitis C Screening  Never done   Zoster Vaccines- Shingrix (1 of 2) Never done   COVID-19 Vaccine (4 - Booster for Pfizer series) 02/25/2021   Lab Results Lab Results  Component Value Date   WBC 8.9 05/11/2021   HGB 13.8 05/11/2021   HCT 40.6 05/11/2021   MCV 87.4 05/11/2021   PLT 262.0 05/11/2021   Lab Results  Component Value Date   TSH 3.93 05/11/2021   BMP Latest Ref Rng & Units 05/11/2021 02/27/2021 07/18/2020  Glucose 70 - 99 mg/dL 93 110(H) 111(H)  BUN 6 - 23 mg/dL 22 23 14   Creatinine 0.40 - 1.50 mg/dL 1.03 0.94 0.87  Sodium 135 - 145 mEq/L 136 137 139  Potassium 3.5 - 5.1 mEq/L 4.6 4.4 4.0  Chloride 96 - 112 mEq/L 100 101 109  CO2 19 - 32 mEq/L 27 27 21(L)  Calcium 8.4 - 10.5 mg/dL 9.3 9.1 8.7(L)     Depression screen [facility] 2/9 05/29/2021 02/27/2021 03/06/2020  Decreased Interest 2 0 0  Down, Depressed, Hopeless 2 0 0  PHQ - 2 Score 4 0 0  Altered sleeping 3 - 0  Tired, decreased energy 3 - 3  Change in appetite 0 - 0  Feeling bad or failure about yourself  0 - 0  Trouble concentrating 3 - 2  Moving slowly or fidgety/restless 2 - 0  Suicidal thoughts 0 - 0  PHQ-9 Score 15 - 5  Difficult doing work/chores Not difficult at all -  Not difficult at all   GAD 7 : Generalized Anxiety Score 05/29/2021  Nervous, Anxious, on Edge 1  Control/stop worrying 3  Worry too much - different things 3  Trouble relaxing 3  Restless 2  Easily annoyed or irritable 2  Afraid - awful might happen 0  Total GAD 7 Score 14  Anxiety Difficulty Not difficult at all   Assessment & Plan:   1. Essential hypertension Blood pressure is adequately controlled. His dizziness has some mixed features of orthostasis and dysequilibrium. He has a cardiology appointment pending later this month. If the cardiology assessment is not revealing of a cardiac issue for his "dizziness", I will consider a neurology referral.  2. Fatigue, unspecified type Lab  evaluation has not shown other underlying causes for this. His questions today about depression may be the issue.  3. Non-restorative sleep Pending with sleep study. This also could be impacted by depression, but would like to rule-out OSA.  4. Depression, major, single episode, mild ([place]) Mr. Pohlmann's PHQ and GAD7 scores are moderately elevated. I do believe he has some underlying depression, likely being triggered by work and personal struggles. I will start him on Paxil and titrate up. I recommend a 64-month course of treatment before a trial of stopping the medication. I will have him return in 6 weeks.  - PARoxetine (PAXIL) 10 MG tablet; Take 1 tablet (10 mg total) by mouth daily for 30 days, THEN 2 tablets (20 mg total) daily.  Dispense: 90 tablet; Refill: 0  Haydee Salter, MD

## 2021-05-29 NOTE — Telephone Encounter (Signed)
Patient was asking today about the sleep study that was scheduled back on 05/11/21 and he hasn't heard anything yet.  Can you please and thank you check on this for him?  Dm/cma

## 2021-06-02 NOTE — Telephone Encounter (Signed)
Please advise on David Goodman's message regarding the sleep study referral.   Thanks. Dm/cma

## 2021-06-04 ENCOUNTER — Encounter (HOSPITAL_BASED_OUTPATIENT_CLINIC_OR_DEPARTMENT_OTHER): Payer: Self-pay | Admitting: Emergency Medicine

## 2021-06-04 ENCOUNTER — Emergency Department (HOSPITAL_COMMUNITY): Payer: Medicare Other

## 2021-06-04 ENCOUNTER — Other Ambulatory Visit: Payer: Self-pay

## 2021-06-04 ENCOUNTER — Emergency Department (HOSPITAL_BASED_OUTPATIENT_CLINIC_OR_DEPARTMENT_OTHER): Payer: Medicare Other

## 2021-06-04 ENCOUNTER — Emergency Department (HOSPITAL_BASED_OUTPATIENT_CLINIC_OR_DEPARTMENT_OTHER)
Admission: EM | Admit: 2021-06-04 | Discharge: 2021-06-04 | Disposition: A | Discharge status: Home / Self Care | Payer: Medicare Other | Attending: Emergency Medicine | Admitting: Emergency Medicine

## 2021-06-04 DIAGNOSIS — I10 Essential (primary) hypertension: Secondary | ICD-10-CM | POA: Diagnosis not present

## 2021-06-04 DIAGNOSIS — G319 Degenerative disease of nervous system, unspecified: Secondary | ICD-10-CM | POA: Diagnosis not present

## 2021-06-04 DIAGNOSIS — G510 Bell's palsy: Secondary | ICD-10-CM | POA: Diagnosis not present

## 2021-06-04 DIAGNOSIS — Y900 Blood alcohol level of less than 20 mg/100 ml: Secondary | ICD-10-CM | POA: Diagnosis not present

## 2021-06-04 DIAGNOSIS — Z791 Long term (current) use of non-steroidal anti-inflammatories (NSAID): Secondary | ICD-10-CM | POA: Insufficient documentation

## 2021-06-04 DIAGNOSIS — Z79899 Other long term (current) drug therapy: Secondary | ICD-10-CM | POA: Insufficient documentation

## 2021-06-04 DIAGNOSIS — Z8616 Personal history of COVID-19: Secondary | ICD-10-CM | POA: Diagnosis not present

## 2021-06-04 DIAGNOSIS — I6381 Other cerebral infarction due to occlusion or stenosis of small artery: Secondary | ICD-10-CM | POA: Diagnosis not present

## 2021-06-04 DIAGNOSIS — R200 Anesthesia of skin: Secondary | ICD-10-CM | POA: Diagnosis not present

## 2021-06-04 DIAGNOSIS — Z20822 Contact with and (suspected) exposure to covid-19: Secondary | ICD-10-CM | POA: Diagnosis not present

## 2021-06-04 DIAGNOSIS — I679 Cerebrovascular disease, unspecified: Secondary | ICD-10-CM | POA: Insufficient documentation

## 2021-06-04 DIAGNOSIS — R9082 White matter disease, unspecified: Secondary | ICD-10-CM | POA: Diagnosis not present

## 2021-06-04 DIAGNOSIS — I6782 Cerebral ischemia: Secondary | ICD-10-CM | POA: Insufficient documentation

## 2021-06-04 LAB — COMPREHENSIVE METABOLIC PANEL
ALT: 18 U/L (ref 0–44)
AST: 21 U/L (ref 15–41)
Albumin: 3.9 g/dL (ref 3.5–5.0)
Alkaline Phosphatase: 51 U/L (ref 38–126)
Anion gap: 10 (ref 5–15)
BUN: 27 mg/dL — ABNORMAL HIGH (ref 8–23)
CO2: 27 mmol/L (ref 22–32)
Calcium: 9 mg/dL (ref 8.9–10.3)
Chloride: 101 mmol/L (ref 98–111)
Creatinine, Ser: 1.03 mg/dL (ref 0.61–1.24)
GFR, Estimated: >60 mL/min (ref >60)
Glucose, Bld: 101 mg/dL — ABNORMAL HIGH (ref 70–99)
Potassium: 4.1 mmol/L (ref 3.5–5.1)
Sodium: 138 mmol/L (ref 135–145)
Total Bilirubin: 0.3 mg/dL (ref 0.3–1.2)
Total Protein: 6.6 g/dL (ref 6.5–8.1)

## 2021-06-04 LAB — URINALYSIS, ROUTINE W REFLEX MICROSCOPIC
Bilirubin Urine: NEGATIVE
Glucose, UA: NEGATIVE mg/dL
Hgb urine dipstick: NEGATIVE
Ketones, ur: NEGATIVE mg/dL
Leukocytes,Ua: NEGATIVE
Nitrite: NEGATIVE
Protein, ur: NEGATIVE mg/dL
Specific Gravity, Urine: 1.015 (ref 1.005–1.030)
pH: 7 (ref 5.0–8.0)

## 2021-06-04 LAB — CBC
HCT: 39.4 % (ref 39.0–52.0)
Hemoglobin: 13.4 g/dL (ref 13.0–17.0)
MCH: 29.6 pg (ref 26.0–34.0)
MCHC: 34 g/dL (ref 30.0–36.0)
MCV: 87 fL (ref 80.0–100.0)
Platelets: 228 10*3/uL (ref 150–400)
RBC: 4.53 MIL/uL (ref 4.22–5.81)
RDW: 13.2 % (ref 11.5–15.5)
WBC: 7.8 10*3/uL (ref 4.0–10.5)
nRBC: 0 % (ref 0.0–0.2)

## 2021-06-04 LAB — RAPID URINE DRUG SCREEN, HOSP PERFORMED
Amphetamines: NOT DETECTED
Barbiturates: NOT DETECTED
Benzodiazepines: NOT DETECTED
Cocaine: NOT DETECTED
Opiates: NOT DETECTED
Tetrahydrocannabinol: POSITIVE — AB

## 2021-06-04 LAB — RESP PANEL BY RT-PCR (FLU A&B, COVID) ARPGX2
Influenza A by PCR: NEGATIVE
Influenza B by PCR: NEGATIVE
SARS Coronavirus 2 by RT PCR: NEGATIVE

## 2021-06-04 LAB — DIFFERENTIAL
Abs Immature Granulocytes: 0.04 10*3/uL (ref 0.00–0.07)
Basophils Absolute: 0 10*3/uL (ref 0.0–0.1)
Basophils Relative: 1 %
Eosinophils Absolute: 0.3 10*3/uL (ref 0.0–0.5)
Eosinophils Relative: 3 %
Immature Granulocytes: 1 %
Lymphocytes Relative: 17 %
Lymphs Abs: 1.3 10*3/uL (ref 0.7–4.0)
Monocytes Absolute: 0.6 10*3/uL (ref 0.1–1.0)
Monocytes Relative: 8 %
Neutro Abs: 5.5 10*3/uL (ref 1.7–7.7)
Neutrophils Relative %: 70 %

## 2021-06-04 LAB — APTT: aPTT: 29 seconds (ref 24–36)

## 2021-06-04 LAB — ETHANOL: Alcohol, Ethyl (B): <10 mg/dL (ref <10)

## 2021-06-04 LAB — PROTIME-INR
INR: 1 (ref 0.8–1.2)
Prothrombin Time: 13.6 seconds (ref 11.4–15.2)

## 2021-06-04 MED ORDER — PREDNISONE 20 MG PO TABS: 20.0000 mg | ORAL_TABLET | Freq: Two times a day (BID) | ORAL | 0 refills | Status: DC

## 2021-06-04 NOTE — ED Notes (Signed)
Patient left ED ambulatory with steady independent gait and is A/O at time of DC. 

## 2021-06-04 NOTE — ED Provider Notes (Signed)
11:06 PM.  Patient alert and cooperative.  He is awaiting MRI to define his current symptoms.  He states yesterday he noticed some difficulty drinking because water went outside of his mouth.  He states he feels like his prior episode of right-sided Bell's palsy.  He has noticed a droopiness of his left face, and some irritation of his left eye with increased tearing.  He denies headache, or other neurologic symptoms.  At this time on examination he appears to have left facial droop consistent with Bell's palsy.  He does have mild light touch dysesthesia of the left forehead and left cheek at this time relative to the right.  MRI has been ordered, and will be obtained to treatment if he has had a stroke.  2:05 PM-no change in clinical status.  He complains of decreased hearing left ear for 2 years.  Examination of left ear does not indicate vesicles externally, in the canal, or on the TM.  Patient stable for discharge.  Prednisone prescription sent to his pharmacy.  Instructed on eye care with Lacri-Lube to avoid damage and keep the eye moist.  Patient is able to completely close his left eye at this time.   Daleen Bo, MD 06/04/21 [phone]

## 2021-06-04 NOTE — Discharge Instructions (Addendum)
It appears that you have Bell's palsy.  This is inflammation of the nerve, which affects the left side of your face.  Sometimes the eye is involved so we recommend that you use an eye dropper such as Lacri-Lube in the left eye 3-4 times a day to help keep it moist.  We sent a prescription for prednisone to your pharmacy.  Start taking it today and complete 5 days of treatment.  Follow-up with your primary care doctor in a week or 2 to make sure that you are getting better.  Return here, if needed for problems

## 2021-06-04 NOTE — ED Provider Notes (Addendum)
[facility] EMERGENCY DEPARTMENT Provider Note   CSN: [id number] Arrival date & time: 06/04/21  1025     History Chief Complaint  Patient presents with   facial numbness    David Goodman is a 80 y.o. male.  Patient with the complaint of right-sided facial numbness starting at 10 in the morning yesterday.  Patient denies any numbness to the extremities any visual problems or speech problems.  Denies any coordination problems.  Patient states that he has a history of Bell's palsy in the past.      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   Erectile dysfunction due to arterial insufficiency 02/27/2021   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 12/29/2020   Cough 12/17/2020   Hemorrhoids 07/17/2020   RLS (restless legs syndrome) 10/17/2019   Syncope 11/15/2018   Diverticulitis 05/25/2018   Osteoarthritis of right knee 12/13/2017   Osteoarthritis of left knee 12/13/2017   Testosterone deficiency 03/22/2017   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/27/2016   GERD (gastroesophageal reflux disease) 09/27/2016   BPH (benign prostatic hyperplasia) 06/18/2013   Dyslipidemia 07/13/2007   Essential hypertension 07/13/2007   Allergic rhinitis 07/13/2007   Peptic ulcer disease 07/13/2007   History of nephrolithiasis 07/13/2007    Past Surgical History:  Procedure Laterality Date   BROW LIFT Bilateral  11/10/2020   Procedure: BLEPHAROPLASTY;  Surgeon: [name], MD;  Location: [facility];  Service: Plastics;  Laterality: Bilateral;  1 hour   COLONOSCOPY WITH PROPOFOL N/A 02/17/2019   Procedure: COLONOSCOPY WITH PROPOFOL;  Surgeon: Ronnette Juniper, MD;  Location: [facility];  Service: Gastroenterology;  Laterality: N/A;   HYDROCELE EXCISION Right 05/15/2003   IR ANGIOGRAM VISCERAL SELECTIVE  02/16/2019   IR ANGIOGRAM VISCERAL SELECTIVE  02/16/2019   IR ANGIOGRAM VISCERAL SELECTIVE  02/16/2019   IR US GUIDE VASC ACCESS RIGHT  02/16/2019   ROTATOR CUFF REPAIR     SPERMATOCELECTOMY Right 05/15/2003       History reviewed. No pertinent family history.  Social History   Tobacco Use   Smoking status: Never   Smokeless tobacco: Never  Vaping Use   Vaping Use: Never used  Substance Use Topics   Alcohol use: Yes    Comment: couple times a week - wine   Drug use: No    Home Medications Prior to Admission medications   Medication Sig Start Date End Date Taking? Authorizing Provider  acetaminophen (TYLENOL) 500 MG tablet Take 1 tablet (500 mg total) by mouth every 6 (six) hours as needed. For use AFTER surgery 10/30/20   Phoebe Sharps C, PA-C  alprostadil (EDEX) 10 MCG injection 10 mcg by Intracavitary route as needed. use no more than 3 times per week    [provider]  dorzolamide-timolol (COSOPT) 22.3-6.8 MG/ML ophthalmic solution SMARTSIG:In Eye(s) 03/30/21   [provider]  fluticasone (FLONASE) 50 MCG/ACT nasal spray Place 2 sprays into both nostrils daily. 07/09/20   Isaac Bliss,  Rayford Halsted, MD  latanoprost (XALATAN) 0.005 % ophthalmic solution Place 1 drop into both eyes at bedtime. 06/20/17   Marletta Lor, MD  losartan (COZAAR) 100 MG tablet Take 1 tablet (100 mg total) by mouth daily. 04/22/21   Haydee Salter, MD  MAGNESIUM PO Take 1 tablet by mouth daily.    [provider]  naproxen (NAPROSYN) 500 MG tablet Take 500 mg by  mouth 2 (two) times daily. 03/18/21   [provider]  omeprazole (PRILOSEC) 20 MG capsule TAKE 1 CAPSULE BY MOUTH  DAILY 01/09/21   Isaac Bliss, Rayford Halsted, MD  PARoxetine (PAXIL) 10 MG tablet Take 1 tablet (10 mg total) by mouth daily for 30 days, THEN 2 tablets (20 mg total) daily. 05/29/21 07/28/21  Haydee Salter, MD  tadalafil (CIALIS) 20 MG tablet Take 1 tablet (20 mg total) by mouth daily as needed for erectile dysfunction. 03/06/20   Isaac Bliss, Rayford Halsted, MD  tamsulosin (FLOMAX) 0.4 MG CAPS capsule TAKE 1 CAPSULE BY MOUTH  DAILY 11/26/20   Isaac Bliss, Rayford Halsted, MD    Allergies    Patient has no known allergies.  Review of Systems   Review of Systems  Constitutional:  Negative for chills and fever.  HENT:  Negative for rhinorrhea and sore throat.   Eyes:  Negative for visual disturbance.  Respiratory:  Negative for cough and shortness of breath.   Cardiovascular:  Negative for chest pain and leg swelling.  Gastrointestinal:  Negative for abdominal pain, diarrhea, nausea and vomiting.  Genitourinary:  Negative for dysuria.  Musculoskeletal:  Negative for back pain and neck pain.  Skin:  Negative for rash.  Neurological:  Positive for facial asymmetry and numbness. Negative for dizziness, speech difficulty, weakness, light-headedness and headaches.  Hematological:  Does not bruise/bleed easily.  Psychiatric/Behavioral:  Negative for confusion.    Physical Exam Updated Vital Signs BP (!) 198/97   Pulse 75   Temp 98.3 F (36.8 C) (Oral)   Resp (!) 24   Ht 1.626 m (5\' 4" )   Wt 82.2 kg   SpO2 97%   BMI 31.11 kg/m   Physical Exam Vitals and nursing note reviewed.  Constitutional:      Appearance: Normal appearance. He is well-developed.  HENT:     Head: Normocephalic and atraumatic.  Eyes:     Extraocular Movements: Extraocular movements intact.     Conjunctiva/sclera: Conjunctivae normal.     Pupils: Pupils are equal, round, and reactive to light.   Cardiovascular:     Rate and Rhythm: Normal rate and regular rhythm.     Heart sounds: No murmur heard. Pulmonary:     Effort: Pulmonary effort is normal. No respiratory distress.     Breath sounds: Normal breath sounds.  Abdominal:     Palpations: Abdomen is soft.     Tenderness: There is no abdominal tenderness.  Musculoskeletal:        General: Normal range of motion.     Cervical back: Normal range of motion and neck supple.  Skin:    General: Skin is warm and dry.  Neurological:     Mental Status: He is alert and oriented to person, place, and time.     Cranial Nerves: Cranial nerve deficit present.     Sensory: Sensory deficit present.     Motor: No weakness.     Coordination: Coordination normal.     Comments: Patient with left facial droop that seems to involve the forehead.  And right-sided and left-sided numbness.  No extremity weakness no sensory deficits to the extremities.  No speech problems.  No coordination problems    ED Results / Procedures / Treatments   Labs (all labs ordered are listed, but only abnormal results are displayed) Labs Reviewed  COMPREHENSIVE METABOLIC PANEL - Abnormal; Notable for the following components:      Result Value   Glucose, Bld 101 (*)    BUN 27 (*)    All other components within normal limits  RAPID URINE DRUG SCREEN, HOSP PERFORMED - Abnormal; Notable for the following components:   Tetrahydrocannabinol POSITIVE (*)    All other components within normal limits  RESP PANEL BY RT-PCR (FLU A&B, COVID) ARPGX2  ETHANOL  CBC  DIFFERENTIAL  URINALYSIS, ROUTINE W REFLEX MICROSCOPIC  PROTIME-INR  APTT    EKG EKG Interpretation  Date/Time:  Thursday June 04 2021 07:02:44 EDT Ventricular Rate:  92 PR Interval:  169 QRS Duration: 110 QT Interval:  384 QTC Calculation: 475 R Axis:   35 Text Interpretation: Sinus rhythm Multiform ventricular premature complexes Probable anteroseptal infarct, old Abnormal ECG Confirmed by  Fredia Sorrow [phone]) on 06/04/2021 7:12:45 AM  Radiology CT HEAD WO CONTRAST  Result Date: 06/04/2021 CLINICAL DATA:  Neuro deficit, acute, stroke suspected. Additional provided: Patient reports left-sided facial weakness and right-sided facial numbness. EXAM: CT HEAD WITHOUT CONTRAST TECHNIQUE: Contiguous axial images were obtained from the base of the skull through the vertex without intravenous contrast. COMPARISON:  No pertinent prior exams available for comparison. FINDINGS: Brain: Mild generalized cerebral atrophy. Chronic lacunar infarct within the left lentiform nucleus. Chronic appearing lacunar infarct within the left thalamocapsular junction. Age-indeterminate lacunar infarct within the left caudate nucleus/internal capsule (series 2, image 14) (series 4, image 48). Background mild patchy and ill-defined hypoattenuation within the cerebral white matter, nonspecific but compatible with chronic small vessel ischemic disease. There is no acute intracranial hemorrhage. No demarcated cortical infarct. No extra-axial fluid collection. No evidence of an intracranial mass. No midline shift. Vascular: No hyperdense vessel.  Atherosclerotic calcifications Skull: Normal. Negative for fracture or focal lesion. Sinuses/Orbits: Mild bilateral frontal and ethmoid sinus mucosal thickening. IMPRESSION: Age-indeterminate lacunar infarct within the left caudate nucleus/internal capsule. Consider brain MRI for further evaluation. Chronic lacunar infarcts within the left lentiform nucleus and left thalamocapsular junction. Background mild generalized parenchymal atrophy and mild cerebral white matter chronic small vessel ischemic disease. Electronically Signed   By: Kellie Simmering DO   On: 06/04/2021 08:12    Procedures Procedures   CRITICAL CARE Performed by: Fredia Sorrow Total critical care time: 35 minutes Critical care time was exclusive of separately billable procedures and treating other  patients. Critical care was necessary to treat or prevent imminent or life-threatening deterioration. Critical care was time spent personally by me on the following activities: development of treatment plan with patient and/or surrogate as well as nursing, discussions with consultants, evaluation of patient's response to treatment, examination of patient, obtaining history from patient or surrogate, ordering and performing treatments and interventions, ordering and review of laboratory studies, ordering and review of radiographic studies, pulse oximetry and re-evaluation of patient's condition.   Medications Ordered in ED Medications - No data to display  ED Course  I have reviewed the triage vital signs and the nursing notes.  Pertinent labs & imaging results that were available during my care of the patient were reviewed by me and considered in my medical decision making (see chart for details).    MDM  Rules/Calculators/A&P                         Patient now seems to be somewhat confused about where the numbness is.  He was adamant upon arrival that it was right-sided.  Clinically had left-sided facial droop involving the forehead consistent with a Bell's palsy.  Onset of symptoms were yesterday at 10 in the morning.  Head CT shows evidence of both chronic left-sided stroke and also age indeterminant stroke.  Specifically it showed age-indeterminate lacunar infarct within the left caudate nucleus internal capsule.  They recommended MRI brain.  Also there was chronically [place]oner infarcts within the left lentiform nucleus and left thalamocapsular junction.  Patient not aware that he had any strokes in the past.  Discussed with Dr. Leonel Ramsay telemetry neurologist.  And he is recommending patient be sent into [facility] for [facility].  If MRI is negative for acute infarct then this is most likely a left-sided Bell's palsy.  If positive for stroke neurology will need to be notified.  Final Clinical  Impression(s) / ED Diagnoses Final diagnoses:  Cerebrovascular accident (CVA), unspecified mechanism ([place])  Left-sided Bell's palsy    Rx / DC Orders ED Discharge Orders     None        Fredia Sorrow, MD 06/04/21 [phone]  Addendum: Patient will be sent into [facility] emergency department to receive MRI MRIs been ordered.  Discussed with Dr. Eulis Foster who is accepting physician.    Fredia Sorrow, MD 06/04/21 [phone]

## 2021-06-04 NOTE — ED Notes (Signed)
Report given to [name] ED charge nurse.

## 2021-06-04 NOTE — Telephone Encounter (Signed)
I sent to [facility] and sent pt a mychart message with the details

## 2021-06-04 NOTE — ED Notes (Signed)
Patient off the unit at this time.

## 2021-06-04 NOTE — ED Notes (Signed)
Ambulatory to bathroom. Gait steady. States he needs to have a BM.

## 2021-06-04 NOTE — ED Triage Notes (Signed)
Pt c/o right sided facial numbness that started 10am yesterday 06/03/21. No extremity weakness

## 2021-06-04 NOTE — Telephone Encounter (Signed)
Thank you. Dm/cma  

## 2021-06-04 NOTE — ED Notes (Addendum)
MRI called for pt. at this time. Patient arrived A/O by EMS from [facility]. Reports numbness to the right side of the face. No neuro deficits noted on assessment.

## 2021-06-05 ENCOUNTER — Encounter: Payer: Self-pay | Admitting: Family Medicine

## 2021-06-05 DIAGNOSIS — G510 Bell's palsy: Secondary | ICD-10-CM | POA: Insufficient documentation

## 2021-06-05 DIAGNOSIS — I679 Cerebrovascular disease, unspecified: Secondary | ICD-10-CM | POA: Insufficient documentation

## 2021-06-25 ENCOUNTER — Ambulatory Visit: Payer: Medicare Other | Admitting: Interventional Cardiology

## 2021-06-25 ENCOUNTER — Other Ambulatory Visit: Payer: Self-pay

## 2021-06-25 ENCOUNTER — Encounter: Payer: Self-pay | Admitting: Interventional Cardiology

## 2021-06-25 VITALS — BP 136/66 | HR 75 | Ht 64.0 in | Wt 178.0 lb

## 2021-06-25 DIAGNOSIS — I519 Heart disease, unspecified: Secondary | ICD-10-CM | POA: Diagnosis not present

## 2021-06-25 DIAGNOSIS — R0602 Shortness of breath: Secondary | ICD-10-CM

## 2021-06-25 DIAGNOSIS — E66811 Obesity, class 1: Secondary | ICD-10-CM

## 2021-06-25 DIAGNOSIS — E669 Obesity, unspecified: Secondary | ICD-10-CM | POA: Diagnosis not present

## 2021-06-25 NOTE — Progress Notes (Signed)
The    Cardiology Office Note   Date:  06/25/2021   ID:  David Goodman, DOB [birth year], MRN [id number]  PCP:  Haydee Salter, MD    No chief complaint on file.    Wt Readings from Last 3 Encounters:  06/25/21 178 lb (80.7 kg)  06/04/21 181 lb 3.5 oz (82.2 kg)  05/29/21 181 lb 3.2 oz (82.2 kg)       History of Present Illness: David Goodman is a 80 y.o. male who is being seen today for the evaluation of SHOB at the request of Rudd, Lillette Boxer, MD.   He was born in [place] and came to the [place] states at the age of 40.  He has lived in [place], [place] in [place].  He worked in [employer] for [employer] and worked his way up.  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  2017 echo showed: "Left ventricle: The cavity size was normal. Systolic function was    mildly reduced. The estimated ejection fraction was in the range    of 45% to 50%. Diffuse hypokinesis. Doppler parameters are    consistent with abnormal left ventricular relaxation (grade 1    diastolic dysfunction).  - Aortic valve: There was mild regurgitation.  - Mitral valve: There was mild regurgitation.  - Left atrium: The atrium was mildly dilated.  - Atrial septum: No defect or patent foramen ovale was identified. "  2017 stress test showed no ischemia.   Prior records from 05/2021 showed: "has a history of hypertension and prior left ventricular dysfunction. He is currently managed on losartan. He notes that often his blood pressure runs higher when he comes to the doctor's office."  He had COVID in 10/2019.  He feels weak since that time. No chest pain with walking.    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  He tries to ride a bike as well.  No sx with that activity.    Having poor sleep and vivid dreams.   He was told he had skipped heart beats.  Denies : Chest pain. Dizziness. Leg edema.  Nitroglycerin use. Orthopnea. Palpitations. Paroxysmal nocturnal dyspnea.  Syncope.    No family history of coronary artery disease.  His father lived to over 30 years old.  No siblings with CAD.   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    Past Surgical History:  Procedure Laterality Date   BROW LIFT Bilateral 11/10/2020   Procedure: BLEPHAROPLASTY;  Surgeon: [name], MD;  Location: [facility];  Service: Plastics;  Laterality: Bilateral;  1 hour   COLONOSCOPY WITH PROPOFOL N/A 02/17/2019   Procedure: COLONOSCOPY WITH PROPOFOL;  Surgeon: Ronnette Juniper, MD;  [facility];  Service: Gastroenterology;  Laterality: N/A;   HYDROCELE EXCISION Right 05/15/2003   IR ANGIOGRAM VISCERAL SELECTIVE  02/16/2019   IR ANGIOGRAM VISCERAL SELECTIVE  02/16/2019   IR ANGIOGRAM VISCERAL SELECTIVE  02/16/2019   IR US GUIDE VASC ACCESS RIGHT  02/16/2019   ROTATOR CUFF REPAIR  SPERMATOCELECTOMY Right 05/15/2003     Current Outpatient Medications  Medication Sig Dispense Refill   dorzolamide-timolol (COSOPT) 22.3-6.8 MG/ML ophthalmic solution SMARTSIG:In Eye(s)     fluticasone (FLONASE) 50 MCG/ACT nasal spray Place 2 sprays into both nostrils daily. 16 g 6   latanoprost (XALATAN) 0.005 % ophthalmic solution Place 1 drop into both eyes at bedtime. 7.5 mL 1   losartan (COZAAR) 100 MG tablet Take 1 tablet (100 mg total) by mouth daily. 90 tablet 0   MAGNESIUM PO Take 1 tablet by mouth daily.     naproxen (NAPROSYN) 500 MG tablet Take 500 mg by mouth 2 (two) times daily.     omeprazole (PRILOSEC) 20 MG capsule TAKE 1 CAPSULE BY MOUTH  DAILY 90  capsule 0   PARoxetine (PAXIL) 10 MG tablet Take 1 tablet (10 mg total) by mouth daily for 30 days, THEN 2 tablets (20 mg total) daily. 90 tablet 0   tadalafil (CIALIS) 20 MG tablet Take 1 tablet (20 mg total) by mouth daily as needed for erectile dysfunction. 30 tablet 2   tamsulosin (FLOMAX) 0.4 MG CAPS capsule TAKE 1 CAPSULE BY MOUTH  DAILY 90 capsule 3   acetaminophen (TYLENOL) 500 MG tablet Take 1 tablet (500 mg total) by mouth every 6 (six) hours as needed. For use AFTER surgery (Patient not taking: Reported on 06/25/2021) 30 tablet 0   alprostadil (EDEX) 10 MCG injection 10 mcg by Intracavitary route as needed. use no more than 3 times per week (Patient not taking: Reported on 06/25/2021)     predniSONE (DELTASONE) 20 MG tablet Take 1 tablet (20 mg total) by mouth 2 (two) times daily. (Patient not taking: Reported on 06/25/2021) 10 tablet 0   Current Facility-Administered Medications  Medication Dose Route Frequency Provider Last Rate Last Admin   albuterol (VENTOLIN HFA) 108 (90 Base) MCG/ACT inhaler 2 puff  2 puff Inhalation Q4H PRN Scot Jun, FNP        Allergies:   Patient has no known allergies.    Social History:  The patient  reports that he has never smoked. He has never used smokeless tobacco. He reports current alcohol use. He reports that he does not use drugs.   Family History:  The patient's family history includes Cancer in his brother.    ROS:  Please see the history of present illness.   Otherwise, review of systems are positive for DOE.   All other systems are reviewed and negative.    PHYSICAL EXAM: VS:  BP 136/66   Pulse 75   Ht '5\' 4"'$  (1.626 m)   Wt 178 lb (80.7 kg)   SpO2 97%   BMI 30.55 kg/m  , BMI Body mass index is 30.55 kg/m. GEN: Well nourished, well developed, in no acute distress HEENT: normal Neck: no JVD, carotid bruits, or masses Cardiac: RRR; no murmurs, rubs, or gallops,no edema  Respiratory:  clear to auscultation bilaterally, normal  work of breathing GI: soft, nontender, nondistended, + BS; moderate abdominal obesity MS: no deformity or atrophy Skin: warm and dry, no rash Neuro:  Strength and sensation are intact, left eye does not close fully related to prior bells palsy Psych: euthymic mood, full affect   EKG:   The ekg ordered today demonstrates NSR, PVCs, nonspecific ST changes   Recent Labs: 05/11/2021: TSH 3.93 06/04/2021: ALT 18; BUN 27; Creatinine, Ser 1.03; Hemoglobin 13.4; Platelets 228; Potassium 4.1; Sodium 138   Lipid Panel    Component Value Date/Time  CHOL 209 (H) 02/27/2021 0950   TRIG 232.0 (H) 02/27/2021 0950   HDL 52.90 02/27/2021 0950   CHOLHDL 4 02/27/2021 0950   VLDL 46.4 (H) 02/27/2021 0950   LDLCALC 99 10/31/2018 0820   LDLDIRECT 129.0 02/27/2021 0950     Other studies Reviewed: Additional studies/ records that were reviewed today with results demonstrating: prior records reviewed.   ASSESSMENT AND PLAN:  Left ventricular dysfunction/shortness of breath: Prior LV dysfunction noted in 2017.  No imaging since that time.  Stress test was reassuring as there was no ischemia.  We will recheck echocardiogram.  He has had shortness of breath since his COVID infection.  May be some deconditioning as well.  Exercise is limited by knee pain. Hypertension: Blood pressure well controlled. Obesity: Regular exercise.  Healthy diet.   Current medicines are reviewed at length with the patient today.  The patient concerns regarding his medicines were addressed.  The following changes have been made:  No change  Labs/ tests ordered today include:   Orders Placed This Encounter  Procedures   ECHOCARDIOGRAM COMPLETE    Recommend 150 minutes/week of aerobic exercise Low fat, low carb, high fiber diet recommended  Disposition:   FU for echo   Lawson Radar, MD  06/25/2021 12:30 PM    [facility] [place], [place], [place]  [address] Phone: [phone]; Fax: [phone]

## 2021-06-25 NOTE — Patient Instructions (Signed)
Medication Instructions:  Your physician recommends that you continue on your current medications as directed. Please refer to the Current Medication list given to you today.  *If you need a refill on your cardiac medications before your next appointment, please call your pharmacy*   Lab Work: none If you have labs (blood work) drawn today and your tests are completely normal, you will receive your results only by: [place] (if you have MyChart) OR A paper copy in the mail If you have any lab test that is abnormal or we need to change your treatment, we will call you to review the results.   Testing/Procedures: Your physician has requested that you have an echocardiogram. Echocardiography is a painless test that uses sound waves to create images of your heart. It provides your doctor with information about the size and shape of your heart and how well your heart's chambers and valves are working. This procedure takes approximately one hour. There are no restrictions for this procedure.    Follow-Up: At [facility], you and your health needs are our priority.  As part of our continuing mission to provide you with exceptional heart care, we have created designated Provider Care Teams.  These Care Teams include your primary Cardiologist (physician) and Advanced Practice Providers (APPs -  Physician Assistants and Nurse Practitioners) who all work together to provide you with the care you need, when you need it.  We recommend signing up for the patient portal called "MyChart".  Sign up information is provided on this After Visit Summary.  MyChart is used to connect with patients for Virtual Visits (Telemedicine).  Patients are able to view lab/test results, encounter notes, upcoming appointments, etc.  Non-urgent messages can be sent to your provider as well.   To learn more about what you can do with MyChart, go to [email or web address].    Your next appointment:   Based on test  results  The format for your next appointment:   In Person  Provider:   You may see Larae Grooms, MD or one of the following Advanced Practice Providers on your designated Care Team:   Melina Copa, PA-C Ermalinda Barrios, PA-C   Other Instructions

## 2021-07-03 DIAGNOSIS — M170 Bilateral primary osteoarthritis of knee: Secondary | ICD-10-CM | POA: Diagnosis not present

## 2021-07-07 ENCOUNTER — Other Ambulatory Visit: Payer: Self-pay

## 2021-07-07 ENCOUNTER — Ambulatory Visit (HOSPITAL_COMMUNITY): Payer: Medicare Other | Attending: Internal Medicine

## 2021-07-07 DIAGNOSIS — R0602 Shortness of breath: Secondary | ICD-10-CM | POA: Insufficient documentation

## 2021-07-07 DIAGNOSIS — I519 Heart disease, unspecified: Secondary | ICD-10-CM | POA: Diagnosis not present

## 2021-07-07 LAB — ECHOCARDIOGRAM COMPLETE
Area-P 1/2: 4.57 cm2
P 1/2 time: 493 msec
S' Lateral: 5 cm

## 2021-07-08 ENCOUNTER — Telehealth: Payer: Self-pay | Admitting: *Deleted

## 2021-07-08 ENCOUNTER — Encounter: Payer: Self-pay | Admitting: *Deleted

## 2021-07-08 DIAGNOSIS — I519 Heart disease, unspecified: Secondary | ICD-10-CM

## 2021-07-08 DIAGNOSIS — R0602 Shortness of breath: Secondary | ICD-10-CM

## 2021-07-08 NOTE — Telephone Encounter (Signed)
Patient notified.  I verbally went over Cardiac CT instructions with patient and will send copy to him through my chart. He is seeing PCP on Friday and I told him I would check to see if BMP could be drawn at that appointment.    Based on protocol dose for lopressor would be 100 mg.  Patient on Paxil.  Reviewed with Fuller Canada, PharmD-  Paroxetine is a strong CYP 2D6 inhibitor and increase concentrations of metoprolol. Studies have shown > 4-fold increase in AUC of metoprolol when used in combination with paroxetine. The AUC doesn't necessarily translate perfectly if we're looking at dosing, but I'd recommend using a lower dose of metoprolol (maybe '50mg'$  instead of '100mg'$ ) because of the interaction  Will check with Dr Irish Lack to see if OK to order lower dose metoprolol.

## 2021-07-08 NOTE — Telephone Encounter (Signed)
-----   Message from Jettie Booze, MD sent at 07/07/2021  3:55 PM EDT ----- Mildly decreased LV function.  COuld be related to prior viral infection but we need to rule out obstructive CAD as a cause.  Given his lack of angina, would plan for CTA coronaries.  OK to give metoprolol per the CT protocol, HR was 75 at the last visit.  Thanks.

## 2021-07-08 NOTE — Progress Notes (Deleted)
Opened in error

## 2021-07-09 MED ORDER — METOPROLOL TARTRATE 50 MG PO TABS: ORAL_TABLET | ORAL | 0 refills | Status: DC

## 2021-07-09 NOTE — Telephone Encounter (Signed)
Reviewed with Dr Irish Lack and Jay to send in 50 mg lopressor for pre CT dose.  Patient made aware of this dose.  Prescription sent to [facility]

## 2021-07-10 ENCOUNTER — Other Ambulatory Visit: Payer: Self-pay

## 2021-07-10 ENCOUNTER — Ambulatory Visit (INDEPENDENT_AMBULATORY_CARE_PROVIDER_SITE_OTHER): Payer: Medicare Other | Admitting: Family Medicine

## 2021-07-10 ENCOUNTER — Encounter: Payer: Self-pay | Admitting: Family Medicine

## 2021-07-10 VITALS — BP 138/76 | HR 63 | Temp 96.6°F | Ht 64.0 in | Wt 176.6 lb

## 2021-07-10 DIAGNOSIS — I519 Heart disease, unspecified: Secondary | ICD-10-CM

## 2021-07-10 DIAGNOSIS — F320 Major depressive disorder, single episode, mild: Secondary | ICD-10-CM | POA: Diagnosis not present

## 2021-07-10 DIAGNOSIS — J301 Allergic rhinitis due to pollen: Secondary | ICD-10-CM | POA: Diagnosis not present

## 2021-07-10 DIAGNOSIS — I10 Essential (primary) hypertension: Secondary | ICD-10-CM | POA: Diagnosis not present

## 2021-07-10 DIAGNOSIS — G510 Bell's palsy: Secondary | ICD-10-CM | POA: Diagnosis not present

## 2021-07-10 LAB — BASIC METABOLIC PANEL
BUN: 28 mg/dL — ABNORMAL HIGH (ref 6–23)
CO2: 24 mEq/L (ref 19–32)
Calcium: 9 mg/dL (ref 8.4–10.5)
Chloride: 107 mEq/L (ref 96–112)
Creatinine, Ser: 0.87 mg/dL (ref 0.40–1.50)
GFR: 81.92 mL/min (ref >60.00)
Glucose, Bld: 93 mg/dL (ref 70–99)
Potassium: 4.2 mEq/L (ref 3.5–5.1)
Sodium: 140 mEq/L (ref 135–145)

## 2021-07-10 MED ORDER — CETIRIZINE HCL 10 MG PO TABS: 10.0000 mg | ORAL_TABLET | Freq: Every day | ORAL | 11 refills | Status: DC

## 2021-07-10 NOTE — Progress Notes (Signed)
[facility] [address] [place] [address] Dept: [phone] Dept Fax: [phone]  Office Visit  Subjective:    [name], male    DOB: 08-Jul-1941, 80 y.o..   MRN: [id number]  Chief Complaint  Patient presents with   Follow-up    3 mo f/u HTN. Pt c/o dizziness and tightness feeling on left side of head.     History of Present Illness:  Patient is in today for reassessment. Since his last visit, David Goodman was seen at [facility] on 7/7 with a left Bell's palsy. He notes that he had had this condition on the right many years ago. He has noted improvement in his left facial droop.  David Goodman is seeing cardiology related to his dizziness. They have planned to do a CT scan and their office had request we do a BMP today to assess.  David Goodman notes that his depression is improving. 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  David Goodman notes some issues with chronic congestion in the left nose and mucous production. He does use Flonase 1 spray bid.  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   Cough 12/17/2020   Hemorrhoids 07/17/2020   RLS (restless legs syndrome) 10/17/2019   Syncope 11/15/2018   Diverticulitis 05/25/2018   Osteoarthritis of right knee 12/13/2017   Osteoarthritis of left knee 12/13/2017   Testosterone deficiency 03/22/2017   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/27/2016   GERD (gastroesophageal reflux disease) 09/27/2016   BPH (benign  prostatic hyperplasia) 06/18/2013   Dyslipidemia 07/13/2007   Essential hypertension 07/13/2007   Allergic rhinitis 07/13/2007   Peptic ulcer disease 07/13/2007   History of nephrolithiasis 07/13/2007   Past Surgical History:  Procedure Laterality Date   BROW LIFT Bilateral 11/10/2020   Procedure: BLEPHAROPLASTY;  Surgeon: [name], MD;  Location: [facility];  Service: Plastics;  Laterality: Bilateral;  1 hour   COLONOSCOPY WITH PROPOFOL N/A 02/17/2019   Procedure: COLONOSCOPY WITH PROPOFOL;  Surgeon: Ronnette Juniper, MD;  [facility];  Service: Gastroenterology;  Laterality: N/A;   HYDROCELE EXCISION Right 05/15/2003   IR ANGIOGRAM VISCERAL SELECTIVE  02/16/2019   IR ANGIOGRAM VISCERAL SELECTIVE  02/16/2019   IR ANGIOGRAM VISCERAL SELECTIVE  02/16/2019   IR US GUIDE VASC ACCESS RIGHT  02/16/2019   ROTATOR CUFF REPAIR     SPERMATOCELECTOMY Right 05/15/2003   Family History  Problem Relation Age of Onset   Cancer Brother    Outpatient Medications Prior to Visit  Medication Sig Dispense Refill   fluticasone (FLONASE) 50 MCG/ACT nasal spray Place 2 sprays into both nostrils daily. 16 g 6   latanoprost (XALATAN) 0.005 % ophthalmic solution Place 1 drop into both eyes at bedtime. 7.5 mL 1   losartan (COZAAR) 100 MG tablet Take 1 tablet (100 mg total) by mouth daily. 90 tablet 0   MAGNESIUM PO Take 1 tablet by mouth daily.     metoprolol tartrate (LOPRESSOR) 50 MG tablet Take one tablet by mouth 2 hours prior to CT Scan 1  tablet 0   naproxen (NAPROSYN) 500 MG tablet Take 500 mg by mouth 2 (two) times daily.     omeprazole (PRILOSEC) 20 MG capsule TAKE 1 CAPSULE BY MOUTH  DAILY 90 capsule 0   PARoxetine (PAXIL) 10 MG tablet Take 1 tablet (10 mg total) by mouth daily for 30 days, THEN 2 tablets (20 mg total) daily. 90 tablet 0   tadalafil (CIALIS) 20 MG tablet Take 1 tablet (20 mg total) by mouth daily as needed for erectile dysfunction. 30 tablet 2   tamsulosin  (FLOMAX) 0.4 MG CAPS capsule TAKE 1 CAPSULE BY MOUTH  DAILY 90 capsule 3   acetaminophen (TYLENOL) 500 MG tablet Take 1 tablet (500 mg total) by mouth every 6 (six) hours as needed. For use AFTER surgery (Patient not taking: Reported on 06/25/2021) 30 tablet 0   alprostadil (EDEX) 10 MCG injection 10 mcg by Intracavitary route as needed. use no more than 3 times per week (Patient not taking: Reported on 06/25/2021)     dorzolamide-timolol (COSOPT) 22.3-6.8 MG/ML ophthalmic solution SMARTSIG:In Eye(s)     predniSONE (DELTASONE) 20 MG tablet Take 1 tablet (20 mg total) by mouth 2 (two) times daily. (Patient not taking: Reported on 06/25/2021) 10 tablet 0   albuterol (VENTOLIN HFA) 108 (90 Base) MCG/ACT inhaler 2 puff      No facility-administered medications prior to visit.   No Known Allergies    Objective:   Today's Vitals   07/10/21 0922  BP: 138/76  Pulse: 63  Temp: (!) 96.6 F (35.9 C)  TempSrc: Temporal  SpO2: 97%  Weight: 176 lb 9.6 oz (80.1 kg)  Height: '5\' 4"'$  (1.626 m)   Body mass index is 30.31 kg/m.   General: Well developed, well nourished. No acute distress. HEENT: Normocephalic, non-traumatic.  Nose  with moderate congestion/swollen turbinates and mild clear rhinorrhea.  Neuro: Mild left facial droop, with very mild weakness of eyelids and smile. Psych: Alert and oriented. Normal mood and affect.  Health Maintenance Due  Topic Date Due   Hepatitis C Screening  Never done   Zoster Vaccines- Shingrix (1 of 2) Never done   COVID-19 Vaccine (4 - Booster for Pfizer series) 02/25/2021   INFLUENZA VACCINE  06/29/2021     Assessment & Plan:   1. Essential hypertension Blood pressure is adequately controlled today. Continue losartan.  2. Bell's palsy, left Improving. This should resolve with more time.  3. Left ventricular dysfunction Continued issues with dizziness. Cardiology has planned to do a Cardiac CT. I will check his renal funciton per their request.  - Basic  metabolic panel  4. Depression, major, single episode, mild (H[facility]) Improving, now that some of his stressors are resolving. Continue Paxil.  5.  Seasonal allergic rhinitis due to pollen Nasal exam is consistent with ongoing allergic rhinitis. I recommend we add a daily antihistamine and see if this will improve symptoms.  - cetirizine (ZYRTEC) 10 MG tablet; Take 1 tablet (10 mg total) by mouth daily.  Dispense: 30 tablet; Refill: 11  Haydee Salter, MD

## 2021-07-17 ENCOUNTER — Telehealth (HOSPITAL_COMMUNITY): Payer: Self-pay | Admitting: *Deleted

## 2021-07-17 NOTE — Telephone Encounter (Signed)
Reaching out to patient to offer assistance regarding upcoming cardiac imaging study; pt verbalizes understanding of appt date/time, parking situation and where to check in, pre-test NPO status and medications ordered, and verified current allergies; name and call back number provided for further questions should they arise  Gordy Clement RN Navigator Cardiac Imaging [name] [phone] office [phone] cell  Patient to take '50mg'$  metoprolol tartrate two hours prior to cardiac CT.

## 2021-07-21 ENCOUNTER — Other Ambulatory Visit: Payer: Self-pay

## 2021-07-21 ENCOUNTER — Ambulatory Visit (HOSPITAL_COMMUNITY)
Admission: RE | Admit: 2021-07-21 | Discharge: 2021-07-21 | Disposition: A | Discharge status: Home / Self Care | Payer: Medicare Other | Source: Ambulatory Visit | Attending: Interventional Cardiology | Admitting: Interventional Cardiology

## 2021-07-21 DIAGNOSIS — I2510 Atherosclerotic heart disease of native coronary artery without angina pectoris: Secondary | ICD-10-CM | POA: Diagnosis not present

## 2021-07-21 DIAGNOSIS — I700 Atherosclerosis of aorta: Secondary | ICD-10-CM | POA: Insufficient documentation

## 2021-07-21 DIAGNOSIS — I519 Heart disease, unspecified: Secondary | ICD-10-CM | POA: Diagnosis not present

## 2021-07-21 DIAGNOSIS — R0602 Shortness of breath: Secondary | ICD-10-CM

## 2021-07-21 MED ORDER — NITROGLYCERIN 0.4 MG SL SUBL: 0.8000 mg | SUBLINGUAL_TABLET | Freq: Once | SUBLINGUAL | Status: AC

## 2021-07-21 MED ORDER — NITROGLYCERIN 0.4 MG SL SUBL
SUBLINGUAL_TABLET | SUBLINGUAL | Status: AC
  Administered 2021-07-21: 0.8 mg via SUBLINGUAL
  Filled 2021-07-21: qty 2

## 2021-07-21 MED ORDER — IOHEXOL 350 MG/ML SOLN
100.0000 mL | Freq: Once | INTRAVENOUS | Status: AC | PRN
  Administered 2021-07-21: 95 mL via INTRAVENOUS

## 2021-07-22 ENCOUNTER — Ambulatory Visit (INDEPENDENT_AMBULATORY_CARE_PROVIDER_SITE_OTHER): Payer: Medicare Other

## 2021-07-22 ENCOUNTER — Other Ambulatory Visit: Payer: Medicare Other | Admitting: *Deleted

## 2021-07-22 ENCOUNTER — Other Ambulatory Visit: Payer: Self-pay | Admitting: Family Medicine

## 2021-07-22 ENCOUNTER — Telehealth: Payer: Self-pay

## 2021-07-22 VITALS — BP 130/78 | HR 72 | Ht 64.0 in | Wt 173.0 lb

## 2021-07-22 DIAGNOSIS — R931 Abnormal findings on diagnostic imaging of heart and coronary circulation: Secondary | ICD-10-CM

## 2021-07-22 DIAGNOSIS — Z01812 Encounter for preprocedural laboratory examination: Secondary | ICD-10-CM

## 2021-07-22 DIAGNOSIS — I10 Essential (primary) hypertension: Secondary | ICD-10-CM

## 2021-07-22 LAB — CBC
Hematocrit: 40.8 % (ref 37.5–51.0)
Hemoglobin: 13.9 g/dL (ref 13.0–17.7)
MCH: 29.6 pg (ref 26.6–33.0)
MCHC: 34.1 g/dL (ref 31.5–35.7)
MCV: 87 fL (ref 79–97)
Platelets: 222 10*3/uL (ref 150–450)
RBC: 4.7 x10E6/uL (ref 4.14–5.80)
RDW: 15.8 % — ABNORMAL HIGH (ref 11.6–15.4)
WBC: 11 10*3/uL — ABNORMAL HIGH (ref 3.4–10.8)

## 2021-07-22 MED ORDER — NITROGLYCERIN 0.4 MG SL SUBL: 0.4000 mg | SUBLINGUAL_TABLET | SUBLINGUAL | 3 refills | Status: AC | PRN

## 2021-07-22 NOTE — Telephone Encounter (Signed)
-----   Message from Jettie Booze, MD sent at 07/21/2021  5:12 PM EDT ----- He will need cardiac cath because it appears he has significant blockages.  Would see if we can do on Friday if the cath lab can reschedule one of the outpatients on my schedule to another provider.  Will cc: Rennis Harding to facilitate.

## 2021-07-22 NOTE — Progress Notes (Signed)
Reason for visit: EKG for cath on 8/30  Name of MD requesting visit: Dr. Irish Lack  H&P: abnormal cardiac CT showing several blockages  Assessment and plan per MD: Patient presents to office for EKG for cath on 8/30. Patient is asymptomatic at this time. Denies chest pain. States that he does get SOB sometimes when he is walking. EKG performed showing NSR with PACs. Reviewed with Dr. Irish Lack and patient will start ASA 81 mg QD and an RX was called in for PRN SL NTG. Reviewed instructions for NTG as well as instructions for his cath on 8/30. Patient did not have a ride to the hospital so transportation has been arranged through [facility] to take the patient to the hospital for his procedure. Waiver form sent to transportation services. David Goodman, the patient's secretary is going to pick him up after the procedure. Her number is [phone]. ER precautions were reviewed with the patient. He verbalized understanding.

## 2021-07-22 NOTE — Telephone Encounter (Signed)
   Called and made patient aware of abnormal CT results. Cath Lab full with all providers on Friday. Okay to schedule cath on Tuesday per Dr. Irish Lack. Patient scheduled for cath on 8/30. Patient will come in today for labs and an EKG and we will review instructions below at that time. No visit needed, H&P to be done later by [facility]. Message sent to pre-cert.  [facility] OFFICE [place], [place] [address] Dept: [phone] Loc: [facility]  07/22/2021  You are scheduled for a Cardiac Catheterization on Tuesday, August 30 with Dr. Larae Grooms.  1. Please arrive at the [facility] (Main Entrance A) at [facility]: [address] [place], [place] [address] at 5:30 AM (This time is two hours before your procedure to ensure your preparation). Free valet parking service is available.   Special note: Every effort is made to have your procedure done on time. Please understand that emergencies sometimes delay scheduled procedures.  2. Diet: Do not eat solid foods after midnight.  The patient may have clear liquids until 5am upon the day of the procedure.  3. Labs: You will need to have blood drawn TODAY (CBC) and a Nurse Visit EKG  4. Medication instructions in preparation for your procedure:   Contrast Allergy: No    On the morning of your procedure, take a baby Aspirin and any regular morning medicines.  You may use sips of water.  5. Plan for one night stay--bring personal belongings. 6. Bring a current list of your medications and current insurance cards. 7. You MUST have a responsible person to drive you home. 8. Someone MUST be with you the first 24 hours after you arrive home or your discharge will be delayed. 9. Please wear clothes that are easy to get on and off and wear slip-on shoes.  Thank you for allowing [place] to care for you!   -- [facility]

## 2021-07-27 ENCOUNTER — Telehealth: Payer: Self-pay | Admitting: *Deleted

## 2021-07-27 ENCOUNTER — Ambulatory Visit: Payer: Medicare Other

## 2021-07-27 NOTE — Telephone Encounter (Signed)
Cardiac catheterization scheduled at [facility] for: Tuesday July 28, 2021 7:30 [facility] Main Entrance A [facility]) at: 5:30 AM   No solid food after midnight prior to cath, clear liquids until 5 AM day of procedure.   Morning medications can be taken pre-cath with sips of water including aspirin 81 mg.    Confirmed patient has responsible adult to drive home post procedure and be with patient first 24 hours after arriving home*  Patients are allowed one visitor in the waiting room during the time they are at the hospital for their procedure. Both patient and visitor must wear a mask once they enter the hospital.    Patient reports does not currently have any symptoms concerning for COVID-19 and no household members with COVID-19 like illness.      Reviewed procedure/mask/visitor instructions with patient.                           Patient reports he has already made arrangements with [facility] transportation to the hospital tomorrow morning, he also has transportation home from the hospital at discharge.                         Patient reports he will have responsible adult to be with him at home if same day discharge.

## 2021-07-28 ENCOUNTER — Ambulatory Visit (HOSPITAL_COMMUNITY)
Admission: RE | Admit: 2021-07-28 | Discharge: 2021-07-28 | Disposition: A | Discharge status: Home / Self Care | Payer: Medicare Other | Attending: Interventional Cardiology | Admitting: Interventional Cardiology

## 2021-07-28 ENCOUNTER — Ambulatory Visit (HOSPITAL_COMMUNITY)
Admission: RE | Disposition: A | Discharge status: Home / Self Care | Payer: Self-pay | Source: Home / Self Care | Attending: Interventional Cardiology

## 2021-07-28 ENCOUNTER — Encounter (HOSPITAL_COMMUNITY): Payer: Self-pay | Admitting: Interventional Cardiology

## 2021-07-28 ENCOUNTER — Other Ambulatory Visit: Payer: Self-pay

## 2021-07-28 ENCOUNTER — Encounter: Payer: Self-pay | Admitting: *Deleted

## 2021-07-28 DIAGNOSIS — R0609 Other forms of dyspnea: Secondary | ICD-10-CM | POA: Diagnosis not present

## 2021-07-28 DIAGNOSIS — I5020 Unspecified systolic (congestive) heart failure: Secondary | ICD-10-CM

## 2021-07-28 DIAGNOSIS — Z006 Encounter for examination for normal comparison and control in clinical research program: Secondary | ICD-10-CM

## 2021-07-28 DIAGNOSIS — I25118 Atherosclerotic heart disease of native coronary artery with other forms of angina pectoris: Secondary | ICD-10-CM | POA: Diagnosis not present

## 2021-07-28 DIAGNOSIS — Z7982 Long term (current) use of aspirin: Secondary | ICD-10-CM | POA: Insufficient documentation

## 2021-07-28 DIAGNOSIS — Z79899 Other long term (current) drug therapy: Secondary | ICD-10-CM | POA: Insufficient documentation

## 2021-07-28 DIAGNOSIS — I5022 Chronic systolic (congestive) heart failure: Secondary | ICD-10-CM

## 2021-07-28 HISTORY — PX: LEFT HEART CATH AND CORONARY ANGIOGRAPHY: CATH118249

## 2021-07-28 SURGERY — LEFT HEART CATH AND CORONARY ANGIOGRAPHY
Anesthesia: LOCAL

## 2021-07-28 MED ORDER — SODIUM CHLORIDE 0.9% FLUSH: 3.0000 mL | Freq: Two times a day (BID) | INTRAVENOUS | Status: DC

## 2021-07-28 MED ORDER — SODIUM CHLORIDE 0.9 % IV SOLN: 250.0000 mL | INTRAVENOUS | Status: DC | PRN

## 2021-07-28 MED ORDER — LIDOCAINE HCL (PF) 1 % IJ SOLN
INTRAMUSCULAR | Status: DC | PRN
  Administered 2021-07-28: 2 mL
  Administered 2021-07-28: 15 mL

## 2021-07-28 MED ORDER — IOHEXOL 350 MG/ML SOLN
INTRAVENOUS | Status: DC | PRN
  Administered 2021-07-28: 120 mL

## 2021-07-28 MED ORDER — HEPARIN (PORCINE) IN NACL 1000-0.9 UT/500ML-% IV SOLN
INTRAVENOUS | Status: AC
  Filled 2021-07-28: qty 1000

## 2021-07-28 MED ORDER — SODIUM CHLORIDE 0.9 % WEIGHT BASED INFUSION
3.0000 mL/kg/h | INTRAVENOUS | Status: AC
  Administered 2021-07-28: 3 mL/kg/h via INTRAVENOUS

## 2021-07-28 MED ORDER — HEPARIN SODIUM (PORCINE) 1000 UNIT/ML IJ SOLN
INTRAMUSCULAR | Status: AC
  Filled 2021-07-28: qty 1

## 2021-07-28 MED ORDER — LIDOCAINE HCL (PF) 1 % IJ SOLN
INTRAMUSCULAR | Status: AC
  Filled 2021-07-28: qty 30

## 2021-07-28 MED ORDER — MIDAZOLAM HCL 2 MG/2ML IJ SOLN
INTRAMUSCULAR | Status: AC
  Filled 2021-07-28: qty 2

## 2021-07-28 MED ORDER — SODIUM CHLORIDE 0.9 % WEIGHT BASED INFUSION: 1.0000 mL/kg/h | INTRAVENOUS | Status: DC

## 2021-07-28 MED ORDER — HYDRALAZINE HCL 20 MG/ML IJ SOLN: 10.0000 mg | INTRAMUSCULAR | Status: DC | PRN

## 2021-07-28 MED ORDER — ACETAMINOPHEN 325 MG PO TABS: 650.0000 mg | ORAL_TABLET | ORAL | Status: DC | PRN

## 2021-07-28 MED ORDER — LABETALOL HCL 5 MG/ML IV SOLN: 10.0000 mg | INTRAVENOUS | Status: DC | PRN

## 2021-07-28 MED ORDER — HEPARIN (PORCINE) IN NACL 1000-0.9 UT/500ML-% IV SOLN
INTRAVENOUS | Status: DC | PRN
  Administered 2021-07-28 (×2): 500 mL

## 2021-07-28 MED ORDER — ONDANSETRON HCL 4 MG/2ML IJ SOLN: 4.0000 mg | Freq: Four times a day (QID) | INTRAMUSCULAR | Status: DC | PRN

## 2021-07-28 MED ORDER — VERAPAMIL HCL 2.5 MG/ML IV SOLN
INTRAVENOUS | Status: DC | PRN
  Administered 2021-07-28: 10 mL via INTRA_ARTERIAL

## 2021-07-28 MED ORDER — FENTANYL CITRATE (PF) 100 MCG/2ML IJ SOLN
INTRAMUSCULAR | Status: DC | PRN
  Administered 2021-07-28 (×2): 25 ug via INTRAVENOUS

## 2021-07-28 MED ORDER — SODIUM CHLORIDE 0.9% FLUSH: 3.0000 mL | INTRAVENOUS | Status: DC | PRN

## 2021-07-28 MED ORDER — MIDAZOLAM HCL 2 MG/2ML IJ SOLN
INTRAMUSCULAR | Status: DC | PRN
  Administered 2021-07-28: 1 mg via INTRAVENOUS
  Administered 2021-07-28: 2 mg via INTRAVENOUS

## 2021-07-28 MED ORDER — SODIUM CHLORIDE 0.9 % IV SOLN: INTRAVENOUS | Status: AC

## 2021-07-28 MED ORDER — VERAPAMIL HCL 2.5 MG/ML IV SOLN
INTRAVENOUS | Status: AC
  Filled 2021-07-28: qty 2

## 2021-07-28 MED ORDER — FENTANYL CITRATE (PF) 100 MCG/2ML IJ SOLN
INTRAMUSCULAR | Status: AC
  Filled 2021-07-28: qty 2

## 2021-07-28 MED ORDER — ASPIRIN 81 MG PO CHEW
81.0000 mg | CHEWABLE_TABLET | ORAL | Status: DC
  Filled 2021-07-28: qty 1

## 2021-07-28 SURGICAL SUPPLY — 18 items
CATH 5FR JL3.5 JR4 ANG PIG MP (CATHETERS) ×1 IMPLANT
CATH INFINITI 5FR AL1 (CATHETERS) ×1 IMPLANT
CATH INFINITI 5FR JL4 (CATHETERS) ×1 IMPLANT
CATH INFINITI 5FR JL5 (CATHETERS) ×1 IMPLANT
CLOSURE MYNX CONTROL 5F (Vascular Products) ×1 IMPLANT
DEVICE RAD COMP TR BAND LRG (VASCULAR PRODUCTS) ×1 IMPLANT
GLIDESHEATH SLEND SS 6F .021 (SHEATH) ×1 IMPLANT
GUIDEWIRE INQWIRE 1.5J.035X260 (WIRE) IMPLANT
INQWIRE 1.5J .035X260CM (WIRE) ×2
KIT HEART LEFT (KITS) ×2 IMPLANT
PACK CARDIAC CATHETERIZATION (CUSTOM PROCEDURE TRAY) ×2 IMPLANT
SHEATH 6FR 85 DEST SLENDER (SHEATH) ×1 IMPLANT
SHEATH PINNACLE 5F 10CM (SHEATH) ×1 IMPLANT
SHEATH PROBE COVER 6X72 (BAG) ×1 IMPLANT
TRANSDUCER W/STOPCOCK (MISCELLANEOUS) ×2 IMPLANT
TUBING CIL FLEX 10 FLL-RA (TUBING) ×2 IMPLANT
WIRE EMERALD 3MM-J .035X150CM (WIRE) ×1 IMPLANT
WIRE HI TORQ VERSACORE-J 145CM (WIRE) ×1 IMPLANT

## 2021-07-28 NOTE — H&P (Signed)
Admit date: 07/28/2021  Primary CardiologistVaranasi Chief complaint/reason for admission: Abnormal CTA  HPI: 80 year old with multiple risk factors for heart disease who was experiencing shortness of breath.  Echocardiogram showed decreased LV function.  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  He is referred for cardiac catheterization.  Dyspnea on exertion has been persistent.    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    PSH:    Past Surgical History:  Procedure Laterality Date   BROW LIFT Bilateral 11/10/2020   Procedure: BLEPHAROPLASTY;  Surgeon: [name], MD;  Location: [facility];  Service: Plastics;  Laterality: Bilateral;  1 hour   COLONOSCOPY WITH PROPOFOL N/A 02/17/2019   Procedure: COLONOSCOPY WITH PROPOFOL;  Surgeon: Ronnette Juniper, MD;  Location: [facility];  Service: Gastroenterology;  Laterality: N/A;   HYDROCELE EXCISION Right 05/15/2003   IR ANGIOGRAM VISCERAL SELECTIVE  02/16/2019   IR ANGIOGRAM VISCERAL SELECTIVE  02/16/2019   IR ANGIOGRAM VISCERAL SELECTIVE  02/16/2019   IR US GUIDE VASC ACCESS RIGHT  02/16/2019   ROTATOR CUFF REPAIR     SPERMATOCELECTOMY Right 05/15/2003    ALLERGIES:   Patient has no known allergies.  Prior to Admit Meds:   Medications Prior to Admission  Medication Sig Dispense Refill Last Dose   aspirin EC 81 MG tablet Take 81 mg by mouth at bedtime. Swallow whole.   07/28/2021 at 0430   cetirizine (ZYRTEC) 10 MG tablet Take 1 tablet (10 mg total) by mouth daily. (Patient taking differently: Take 10 mg by mouth daily as needed for allergies.) 30 tablet  11 Past Week   fluticasone (FLONASE) 50 MCG/ACT nasal spray Place 2 sprays into both nostrils daily. 16 g 6 Past Week   latanoprost (XALATAN) 0.005 % ophthalmic solution Place 1 drop into both eyes at bedtime. 7.5 mL 1 07/27/2021   losartan (COZAAR) 100 MG tablet Take 1 tablet by mouth once daily 90 tablet 0 07/28/2021   MAGNESIUM PO Take 1 tablet by mouth at bedtime.   07/27/2021   Multiple Vitamin (MULTIVITAMIN WITH MINERALS) TABS tablet Take 1 tablet by mouth daily.   Past Week   nitroGLYCERIN (NITROSTAT) 0.4 MG SL tablet Place 1 tablet (0.4 mg total) under the tongue every 5 (five) minutes as needed for chest pain. 25 tablet 3    omeprazole (PRILOSEC) 20 MG capsule TAKE 1 CAPSULE BY MOUTH  DAILY 90 capsule 0 07/28/2021   tamsulosin (FLOMAX) 0.4 MG CAPS capsule TAKE 1 CAPSULE BY MOUTH  DAILY 90 capsule 3 [id number]   trolamine salicylate (ASPERCREME) 10 % cream Apply 1 application topically as needed for muscle pain.   Past Week   acetaminophen (TYLENOL) 500 MG tablet Take 500 mg by mouth every 6 (six) hours as needed for headache.   More than a month   Ascorbic Acid (VITAMIN C) 1000 MG tablet Take 1,000 mg by mouth 2 (two) times a week.      naproxen (NAPROSYN) 500 MG tablet Take 500 mg by mouth 2 (two) times daily.   07/24/2021   PARoxetine (PAXIL) 10 MG tablet Take 1 tablet (10 mg total) by mouth daily  for 30 days, THEN 2 tablets (20 mg total) daily. (Patient not taking: Reported on 07/24/2021) 90 tablet 0 Not Taking   tadalafil (CIALIS) 20 MG tablet Take 1 tablet (20 mg total) by mouth daily as needed for erectile dysfunction. 30 tablet 2 More than a month   Family HX:    Family History  Problem Relation Age of Onset   Cancer Brother    Social HX:    Social History   Socioeconomic History   Marital status: Married    Spouse name: Not on file   Number of children: Not on file   Years of education: Not on file   Highest education level: Not on file  Occupational History   Not on file   Tobacco Use   Smoking status: Never   Smokeless tobacco: Never  Vaping Use   Vaping Use: Never used  Substance and Sexual Activity   Alcohol use: Yes    Comment: couple times a week - wine   Drug use: No   Sexual activity: Not Currently  Other Topics Concern   Not on file  Social History Narrative   Not on file   Social Determinants of Health   Financial Resource Strain: Low Risk    Difficulty of Paying Living Expenses: Not hard at all  Food Insecurity: Not on file  Transportation Needs: No Transportation Needs   Lack of Transportation (Medical): No   Lack of Transportation (Non-Medical): No  Physical Activity: Not on file  Stress: Not on file  Social Connections: Not on file  Intimate Partner Violence: Not on file     ROS:  All 11 ROS were addressed and are negative except what is stated in the HPI  PHYSICAL EXAM Vitals:   07/28/21 0720  BP: (!) 157/84  Pulse: 63  Resp: 16  Temp: 98.1 F (36.7 C)  SpO2: 98%   General: Well developed, well nourished, in no acute distress Head: Eyes PERRLA, No xanthomas.   Normal cephalic and atramatic  Lungs:   Clear bilaterally to auscultation and percussion. Heart:   HRRR S1 S2 Pulses are 2+ & equal.            No carotid bruit. No JVD.  No abdominal bruits. No femoral bruits. Abdomen: Bowel sounds are positive, abdomen soft and non-tender without masses or                  Hernia's noted. Msk:  Back normal, normal gait. Normal strength and tone for age. Extremities:   No clubbing, cyanosis or edema.  Neuro: Alert and oriented X 3. Psych:  Good affect, responds appropriately   Labs:   Lab Results  Component Value Date   WBC 11.0 (H) 07/22/2021   HGB 13.9 07/22/2021   HCT 40.8 07/22/2021   MCV 87 07/22/2021   PLT 222 07/22/2021   No results for input(s): NA, K, CL, CO2, BUN, CREATININE, CALCIUM, PROT, BILITOT, ALKPHOS, ALT, AST, GLUCOSE in the last 168 hours.  Invalid input(s): LABALBU No results found for:  CKTOTAL, CKMB, CKMBINDEX, TROPONINI No results found for: PTT Lab Results  Component Value Date   INR 1.0 06/04/2021   INR 1.1 07/16/2020     Lab Results  Component Value Date   CHOL 209 (H) 02/27/2021   CHOL 174 10/31/2018   CHOL 193 10/10/2017   Lab Results  Component Value Date   HDL 52.90 02/27/2021   HDL 37.30 (L) 10/31/2018   HDL 41.40 10/10/2017   Lab  Results  Component Value Date   LDLCALC 99 10/31/2018   LDLCALC 116 (H) 10/10/2017   Lab Results  Component Value Date   TRIG 232.0 (H) 02/27/2021   TRIG 193.0 (H) 10/31/2018   TRIG 175.0 (H) 10/10/2017   Lab Results  Component Value Date   CHOLHDL 4 02/27/2021   CHOLHDL 5 10/31/2018   CHOLHDL 5 10/10/2017   Lab Results  Component Value Date   LDLDIRECT 129.0 02/27/2021   LDLDIRECT 119.0 09/21/2016   LDLDIRECT 100.0 07/28/2015      Radiology:  No results found.  EKG: Normal sinus rhythm, PAC  ASSESSMENT: Coronary CTA showing multivessel CAD  PLAN: Cardiac cath.  The patient understands that risks include but are not limited to stroke (1 in 1000), death (1 in 43), kidney failure [usually temporary] (1 in 500), bleeding (1 in 200), allergic reaction [possibly serious] (1 in 200), and agrees to proceed.    His wife had shoulder surgery and was able to drive him.  Unfortunately, [facility] transportation did not show up.  He had a friend to drive him this morning.  I reassured him that this was not a problem and we will proceed with the procedure.  Further plans based on cath results.  He will need aggressive secondary prevention including lipid-lowering therapy and antiplatelet therapy.Cath Lab Visit (complete for each Cath Lab visit)  Clinical Evaluation Leading to the Procedure:   ACS: No.  Non-ACS:    Anginal Classification: CCS III  Anti-ischemic medical therapy: Minimal Therapy (1 class of medications)  Non-Invasive Test Results:  CTA high risk  Prior CABG: No previous  CABG        Larae Grooms, MD  07/28/2021  9:38 AM

## 2021-07-28 NOTE — H&P (View-Only) (Signed)
Admit date: 07/28/2021  Primary CardiologistVaranasi Chief complaint/reason for admission: Abnormal CTA  HPI: 80 year old with multiple risk factors for heart disease who was experiencing shortness of breath.  Echocardiogram showed decreased LV function.  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  He is referred for cardiac catheterization.  Dyspnea on exertion has been persistent.    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    PSH:    Past Surgical History:  Procedure Laterality Date   BROW LIFT Bilateral 11/10/2020   Procedure: BLEPHAROPLASTY;  Surgeon: [name], MD;  Location: [facility];  Service: Plastics;  Laterality: Bilateral;  1 hour   COLONOSCOPY WITH PROPOFOL N/A 02/17/2019   Procedure: COLONOSCOPY WITH PROPOFOL;  Surgeon: Ronnette Juniper, MD;  Location: [facility];  Service: Gastroenterology;  Laterality: N/A;   HYDROCELE EXCISION Right 05/15/2003   IR ANGIOGRAM VISCERAL SELECTIVE  02/16/2019   IR ANGIOGRAM VISCERAL SELECTIVE  02/16/2019   IR ANGIOGRAM VISCERAL SELECTIVE  02/16/2019   IR US GUIDE VASC ACCESS RIGHT  02/16/2019   ROTATOR CUFF REPAIR     SPERMATOCELECTOMY Right 05/15/2003    ALLERGIES:   Patient has no known allergies.  Prior to Admit Meds:   Medications Prior to Admission  Medication Sig Dispense Refill Last Dose   aspirin EC 81 MG tablet Take 81 mg by mouth at bedtime. Swallow whole.   07/28/2021 at 0430   cetirizine (ZYRTEC) 10 MG tablet Take 1 tablet (10 mg total) by mouth daily. (Patient taking differently: Take 10 mg by mouth daily as needed for allergies.) 30 tablet  11 Past Week   fluticasone (FLONASE) 50 MCG/ACT nasal spray Place 2 sprays into both nostrils daily. 16 g 6 Past Week   latanoprost (XALATAN) 0.005 % ophthalmic solution Place 1 drop into both eyes at bedtime. 7.5 mL 1 07/27/2021   losartan (COZAAR) 100 MG tablet Take 1 tablet by mouth once daily 90 tablet 0 07/28/2021   MAGNESIUM PO Take 1 tablet by mouth at bedtime.   07/27/2021   Multiple Vitamin (MULTIVITAMIN WITH MINERALS) TABS tablet Take 1 tablet by mouth daily.   Past Week   nitroGLYCERIN (NITROSTAT) 0.4 MG SL tablet Place 1 tablet (0.4 mg total) under the tongue every 5 (five) minutes as needed for chest pain. 25 tablet 3    omeprazole (PRILOSEC) 20 MG capsule TAKE 1 CAPSULE BY MOUTH  DAILY 90 capsule 0 07/28/2021   tamsulosin (FLOMAX) 0.4 MG CAPS capsule TAKE 1 CAPSULE BY MOUTH  DAILY 90 capsule 3 [id number]   trolamine salicylate (ASPERCREME) 10 % cream Apply 1 application topically as needed for muscle pain.   Past Week   acetaminophen (TYLENOL) 500 MG tablet Take 500 mg by mouth every 6 (six) hours as needed for headache.   More than a month   Ascorbic Acid (VITAMIN C) 1000 MG tablet Take 1,000 mg by mouth 2 (two) times a week.      naproxen (NAPROSYN) 500 MG tablet Take 500 mg by mouth 2 (two) times daily.   07/24/2021   PARoxetine (PAXIL) 10 MG tablet Take 1 tablet (10 mg total) by mouth daily  for 30 days, THEN 2 tablets (20 mg total) daily. (Patient not taking: Reported on 07/24/2021) 90 tablet 0 Not Taking   tadalafil (CIALIS) 20 MG tablet Take 1 tablet (20 mg total) by mouth daily as needed for erectile dysfunction. 30 tablet 2 More than a month   Family HX:    Family History  Problem Relation Age of Onset   Cancer Brother    Social HX:    Social History   Socioeconomic History   Marital status: Married    Spouse name: Not on file   Number of children: Not on file   Years of education: Not on file   Highest education level: Not on file  Occupational History   Not on file   Tobacco Use   Smoking status: Never   Smokeless tobacco: Never  Vaping Use   Vaping Use: Never used  Substance and Sexual Activity   Alcohol use: Yes    Comment: couple times a week - wine   Drug use: No   Sexual activity: Not Currently  Other Topics Concern   Not on file  Social History Narrative   Not on file   Social Determinants of Health   Financial Resource Strain: Low Risk    Difficulty of Paying Living Expenses: Not hard at all  Food Insecurity: Not on file  Transportation Needs: No Transportation Needs   Lack of Transportation (Medical): No   Lack of Transportation (Non-Medical): No  Physical Activity: Not on file  Stress: Not on file  Social Connections: Not on file  Intimate Partner Violence: Not on file     ROS:  All 11 ROS were addressed and are negative except what is stated in the HPI  PHYSICAL EXAM Vitals:   07/28/21 0720  BP: (!) 157/84  Pulse: 63  Resp: 16  Temp: 98.1 F (36.7 C)  SpO2: 98%   General: Well developed, well nourished, in no acute distress Head: Eyes PERRLA, No xanthomas.   Normal cephalic and atramatic  Lungs:   Clear bilaterally to auscultation and percussion. Heart:   HRRR S1 S2 Pulses are 2+ & equal.            No carotid bruit. No JVD.  No abdominal bruits. No femoral bruits. Abdomen: Bowel sounds are positive, abdomen soft and non-tender without masses or                  Hernia's noted. Msk:  Back normal, normal gait. Normal strength and tone for age. Extremities:   No clubbing, cyanosis or edema.  Neuro: Alert and oriented X 3. Psych:  Good affect, responds appropriately   Labs:   Lab Results  Component Value Date   WBC 11.0 (H) 07/22/2021   HGB 13.9 07/22/2021   HCT 40.8 07/22/2021   MCV 87 07/22/2021   PLT 222 07/22/2021   No results for input(s): NA, K, CL, CO2, BUN, CREATININE, CALCIUM, PROT, BILITOT, ALKPHOS, ALT, AST, GLUCOSE in the last 168 hours.  Invalid input(s): LABALBU No results found for:  CKTOTAL, CKMB, CKMBINDEX, TROPONINI No results found for: PTT Lab Results  Component Value Date   INR 1.0 06/04/2021   INR 1.1 07/16/2020     Lab Results  Component Value Date   CHOL 209 (H) 02/27/2021   CHOL 174 10/31/2018   CHOL 193 10/10/2017   Lab Results  Component Value Date   HDL 52.90 02/27/2021   HDL 37.30 (L) 10/31/2018   HDL 41.40 10/10/2017   Lab  Results  Component Value Date   LDLCALC 99 10/31/2018   LDLCALC 116 (H) 10/10/2017   Lab Results  Component Value Date   TRIG 232.0 (H) 02/27/2021   TRIG 193.0 (H) 10/31/2018   TRIG 175.0 (H) 10/10/2017   Lab Results  Component Value Date   CHOLHDL 4 02/27/2021   CHOLHDL 5 10/31/2018   CHOLHDL 5 10/10/2017   Lab Results  Component Value Date   LDLDIRECT 129.0 02/27/2021   LDLDIRECT 119.0 09/21/2016   LDLDIRECT 100.0 07/28/2015      Radiology:  No results found.  EKG: Normal sinus rhythm, PAC  ASSESSMENT: Coronary CTA showing multivessel CAD  PLAN: Cardiac cath.  The patient understands that risks include but are not limited to stroke (1 in 1000), death (1 in 22), kidney failure [usually temporary] (1 in 500), bleeding (1 in 200), allergic reaction [possibly serious] (1 in 200), and agrees to proceed.    His wife had shoulder surgery and was able to drive him.  Unfortunately, [facility] transportation did not show up.  He had a friend to drive him this morning.  I reassured him that this was not a problem and we will proceed with the procedure.  Further plans based on cath results.  He will need aggressive secondary prevention including lipid-lowering therapy and antiplatelet therapy.Cath Lab Visit (complete for each Cath Lab visit)  Clinical Evaluation Leading to the Procedure:   ACS: No.  Non-ACS:    Anginal Classification: CCS III  Anti-ischemic medical therapy: Minimal Therapy (1 class of medications)  Non-Invasive Test Results:  CTA high risk  Prior CABG: No previous  CABG        Larae Grooms, MD  07/28/2021  9:38 AM

## 2021-07-28 NOTE — Progress Notes (Signed)
Pt ambulated without difficulty or bleeding.   Discharged home with [facility] [employer] who will drive and his wife who will stay with pt x 24 hrs.

## 2021-07-28 NOTE — Discharge Instructions (Signed)
Radial Site Care  This sheet gives you information about how to care for yourself after your procedure. Your health care provider may also give you more specific instructions. If you have problems or questions, contact your health care provider. What can I expect after the procedure? After the procedure, it is common to have: Bruising and tenderness at the catheter insertion area. Follow these instructions at home: Medicines Take over-the-counter and prescription medicines only as told by your health care provider. Insertion site care Follow instructions from your health care provider about how to take care of your insertion site. Make sure you: Wash your hands with soap and water before you remove your bandage (dressing). If soap and water are not available, use hand sanitizer. May remove dressing in 24 hours. Check your insertion site every day for signs of infection. Check for: Redness, swelling, or pain. Fluid or blood. Pus or a bad smell. Warmth. Do no take baths, swim, or use a hot tub for 5 days. You may shower 24-48 hours after the procedure. Remove the dressing and gently wash the site with plain soap and water. Pat the area dry with a clean towel. Do not rub the site. That could cause bleeding. Do not apply powder or lotion to the site. Activity  For 24 hours after the procedure, or as directed by your health care provider: Do not flex or bend the affected arm. Do not push or pull heavy objects with the affected arm. Do not drive yourself home from the hospital or clinic. You may drive 24 hours after the procedure. Do not operate machinery or power tools. KEEP ARM ELEVATED THE REMAINDER OF THE DAY. Do not push, pull or lift anything that is heavier than 10 lb for 5 days. Ask your health care provider when it is okay to: Return to work or school. Resume usual physical activities or sports. Resume sexual activity. General instructions If the catheter site starts to  bleed, raise your arm and put firm pressure on the site. If the bleeding does not stop, get help right away. This is a medical emergency. DRINK PLENTY OF FLUIDS FOR THE NEXT 2-3 DAYS. No alcohol consumption for 24 hours after receiving sedation. If you went home on the same day as your procedure, a responsible adult should be with you for the first 24 hours after you arrive home. Keep all follow-up visits as told by your health care provider. This is important. Contact a health care provider if: You have a fever. You have redness, swelling, or yellow drainage around your insertion site. Get help right away if: You have unusual pain at the radial site. The catheter insertion area swells very fast. The insertion area is bleeding, and the bleeding does not stop when you hold steady pressure on the area. Your arm or hand becomes pale, cool, tingly, or numb. These symptoms may represent a serious problem that is an emergency. Do not wait to see if the symptoms will go away. Get medical help right away. Call your local emergency services (911 in the [place] Do not drive yourself to the hospital. Summary After the procedure, it is common to have bruising and tenderness at the site. Follow instructions from your health care provider about how to take care of your radial site wound. Check the wound every day for signs of infection.  This information is not intended to replace advice given to you by your health care provider. Make sure you discuss any questions you have with   your health care provider. Document Revised: 12/21/2017 Document Reviewed: 12/21/2017 Elsevier Patient Education  2020 [employer]  

## 2021-07-29 MED FILL — Heparin Sodium (Porcine) Inj 1000 Unit/ML: INTRAMUSCULAR | Qty: 10 | Status: AC

## 2021-08-05 ENCOUNTER — Institutional Professional Consult (permissible substitution): Payer: Medicare Other | Admitting: Neurology

## 2021-08-10 ENCOUNTER — Telehealth: Payer: Self-pay | Admitting: Interventional Cardiology

## 2021-08-10 DIAGNOSIS — Z01812 Encounter for preprocedural laboratory examination: Secondary | ICD-10-CM

## 2021-08-10 DIAGNOSIS — I2510 Atherosclerotic heart disease of native coronary artery without angina pectoris: Secondary | ICD-10-CM

## 2021-08-10 DIAGNOSIS — R931 Abnormal findings on diagnostic imaging of heart and coronary circulation: Secondary | ICD-10-CM

## 2021-08-10 NOTE — Telephone Encounter (Signed)
I spoke with patient. He needs to have procedure on Tuesday or Thursday and can do next week.  Dr Irish Lack is not in the cath lab next week.  Patient can do procedure on 9/27.  Procedure scheduled for 08/25/21 at 7:30.  I told patient I would call him back later this week with instructions.

## 2021-08-10 NOTE — Telephone Encounter (Signed)
Pat,  Can you check with the patient when he wants to schedule his cath/PCI.  His wife had an appt last Thursday for her shoulder so he wanted to wait until after that time.  Hpefully, she is getting better.  We were waiting for her to be able to help him post procedure.    JV

## 2021-08-13 ENCOUNTER — Encounter: Payer: Self-pay | Admitting: *Deleted

## 2021-08-13 MED ORDER — PANTOPRAZOLE SODIUM 40 MG PO TBEC: 40.0000 mg | DELAYED_RELEASE_TABLET | Freq: Every day | ORAL | 6 refills | Status: DC

## 2021-08-13 MED ORDER — CLOPIDOGREL BISULFATE 75 MG PO TABS: ORAL_TABLET | ORAL | 6 refills | Status: DC

## 2021-08-13 NOTE — Telephone Encounter (Signed)
Patient on Omeprazole. Per Dr Irish Lack patient should stop Omeprazole and start Pantoprazole 40 mg daily.  Patient notified. Prescriptions sent to [facility].

## 2021-08-13 NOTE — Telephone Encounter (Signed)
Reviewed with Dr Irish Lack and patient should start plavix 300 mg x1, then 75 mg daily.  Patient notified. I verbally went over cath instructions with him and will send copy to him through my chart. He will come in for EKG and lab work on 9/20 at 1:00.

## 2021-08-15 IMAGING — DX DG CHEST 2V
2 series · 2 of 2 positions shown · non-contrast
Comparison: 12/23/2008 chest radiograph.

CLINICAL DATA: IV1MU-V6 positive 1 month prior. Persistent week and
cough.

EXAM:
CHEST - 2 VIEW

[chest pa]
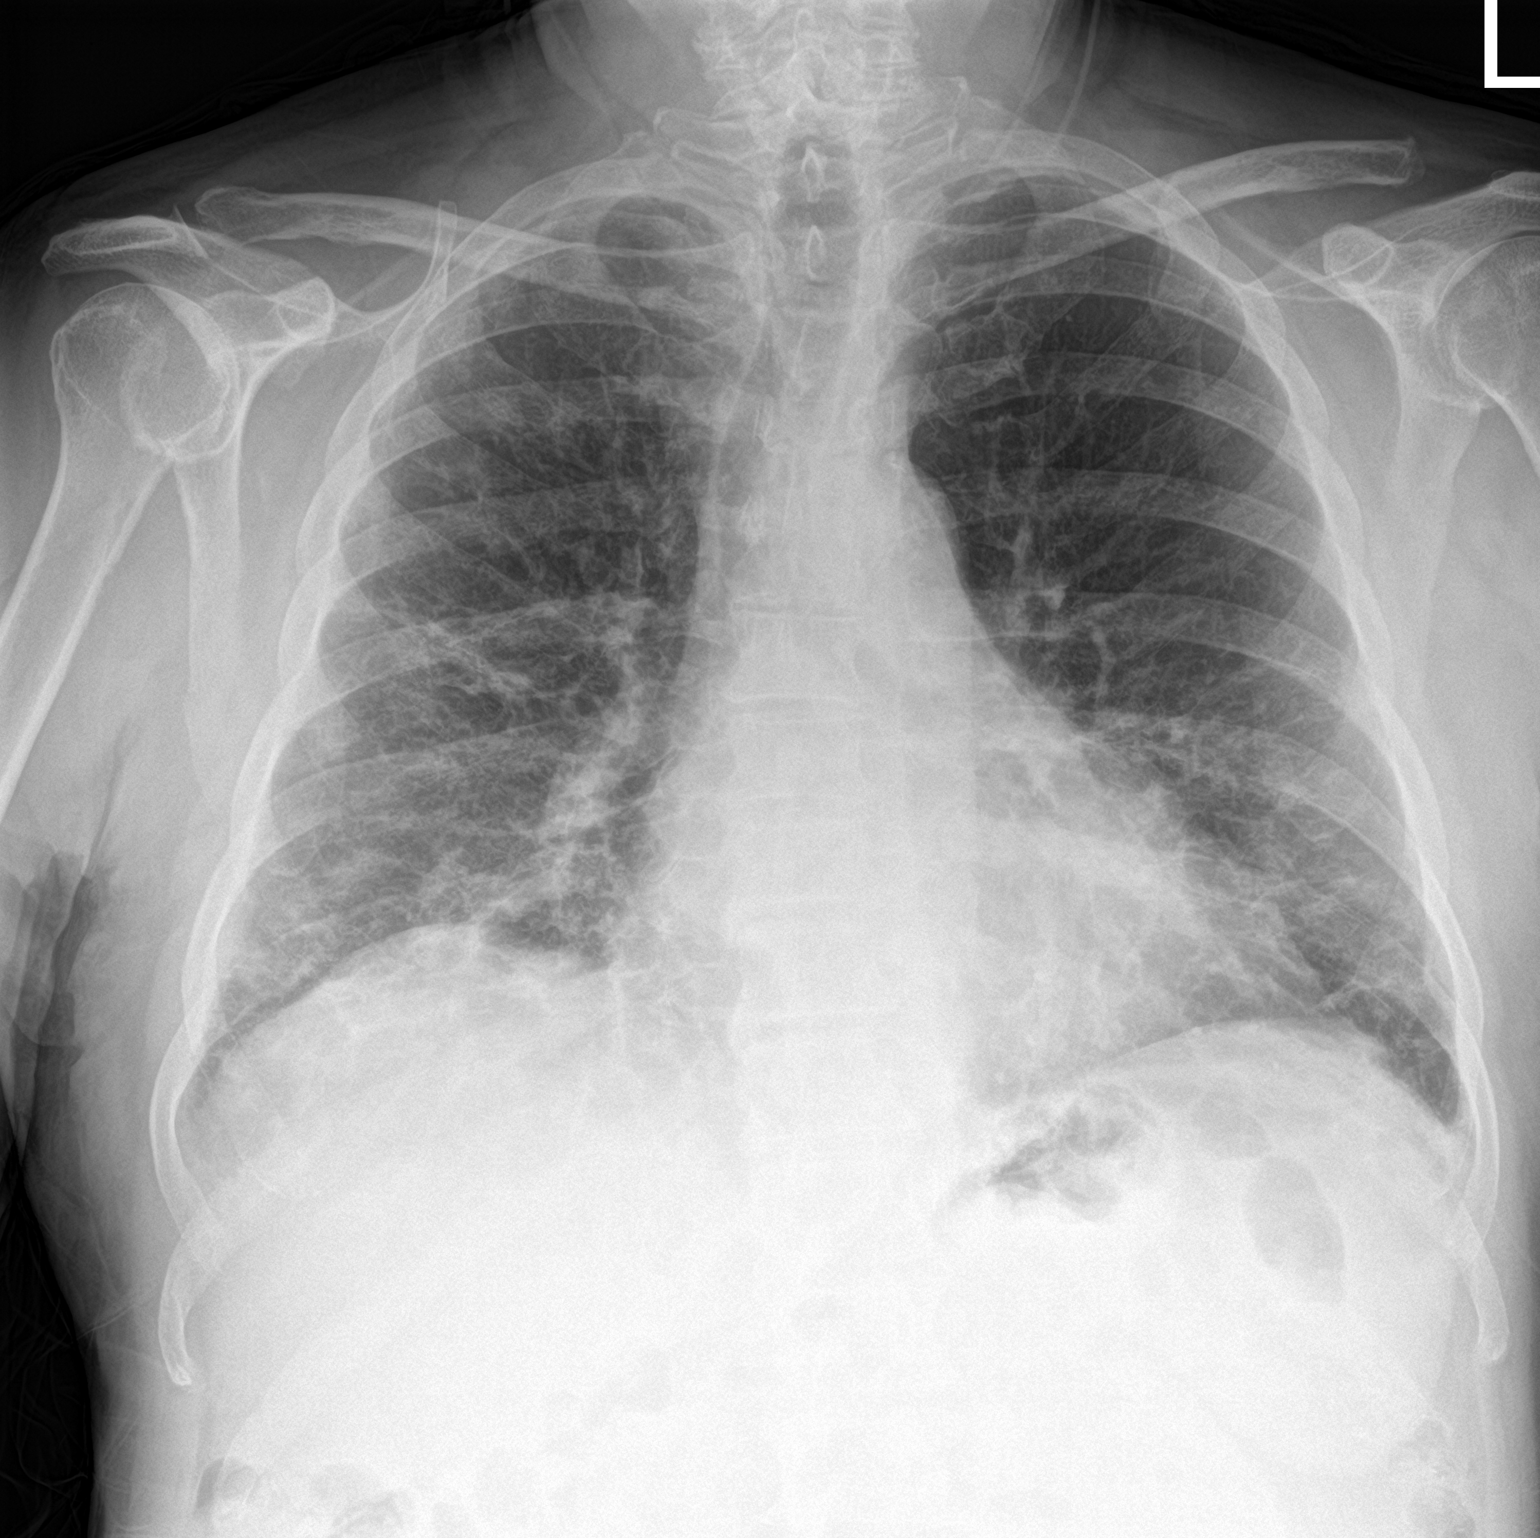

[chest lat]
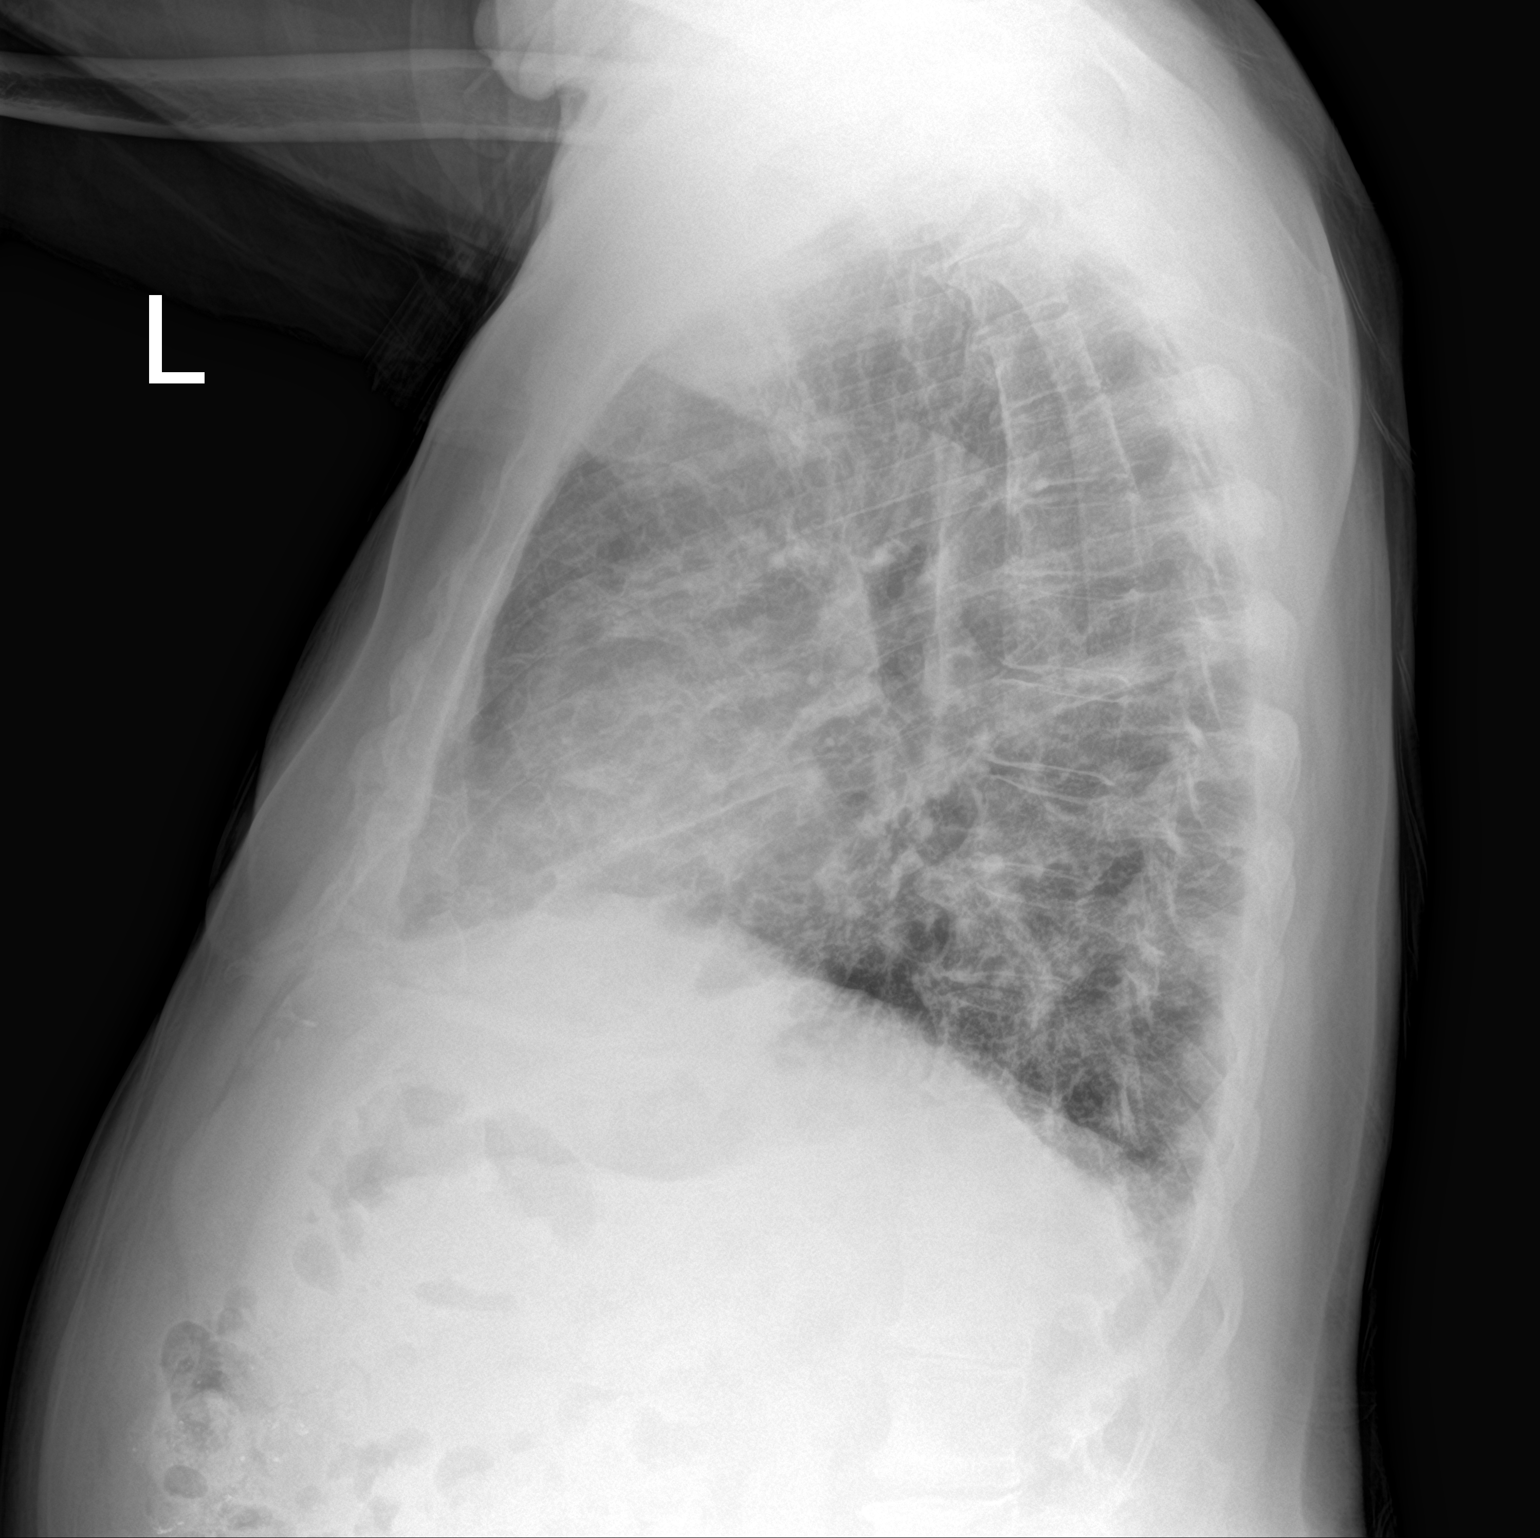

[2 of 2 positions shown; findings below may reference images not displayed]

FINDINGS: Stable cardiomediastinal silhouette with normal heart size. No
pneumothorax. No pleural effusion. Widespread patchy hazy and
reticular opacities throughout both lungs, most prominent in the
lower lungs, new from prior chest radiograph.
IMPRESSION: Widespread patchy hazy and reticular opacities throughout both
lungs, most prominent in the lower lungs. Findings may represent
evolving postinflammatory fibrosis. Suggest follow-up
high-resolution chest CT study in 2-3 months.

## 2021-08-18 ENCOUNTER — Other Ambulatory Visit: Payer: Medicare Other

## 2021-08-18 ENCOUNTER — Other Ambulatory Visit: Payer: Self-pay

## 2021-08-18 ENCOUNTER — Ambulatory Visit: Payer: Medicare Other | Admitting: *Deleted

## 2021-08-18 VITALS — BP 138/80 | HR 69

## 2021-08-18 DIAGNOSIS — R931 Abnormal findings on diagnostic imaging of heart and coronary circulation: Secondary | ICD-10-CM

## 2021-08-18 DIAGNOSIS — Z01812 Encounter for preprocedural laboratory examination: Secondary | ICD-10-CM | POA: Diagnosis not present

## 2021-08-18 DIAGNOSIS — I2510 Atherosclerotic heart disease of native coronary artery without angina pectoris: Secondary | ICD-10-CM

## 2021-08-18 LAB — CBC
Hematocrit: 39.9 % (ref 37.5–51.0)
Hemoglobin: 13.6 g/dL (ref 13.0–17.7)
MCH: 29.4 pg (ref 26.6–33.0)
MCHC: 34.1 g/dL (ref 31.5–35.7)
MCV: 86 fL (ref 79–97)
Platelets: 243 10*3/uL (ref 150–450)
RBC: 4.62 x10E6/uL (ref 4.14–5.80)
RDW: 16.2 % — ABNORMAL HIGH (ref 11.6–15.4)
WBC: 8.7 10*3/uL (ref 3.4–10.8)

## 2021-08-18 LAB — BASIC METABOLIC PANEL
BUN/Creatinine Ratio: 22 (ref 10–24)
BUN: 20 mg/dL (ref 8–27)
CO2: 23 mmol/L (ref 20–29)
Calcium: 9 mg/dL (ref 8.6–10.2)
Chloride: 101 mmol/L (ref 96–106)
Creatinine, Ser: 0.91 mg/dL (ref 0.76–1.27)
Glucose: 99 mg/dL (ref 65–99)
Potassium: 4.3 mmol/L (ref 3.5–5.2)
Sodium: 134 mmol/L (ref 134–144)
eGFR: 86 mL/min/{1.73_m2} (ref >59)

## 2021-08-18 NOTE — Progress Notes (Signed)
Reason for visit: EKG  Name of MD requesting visit: Dr Irish Lack  H&P: EKG to be done prior to PCI  ROS related to problem: Patient without complaint  Assessment and plan per MD: EKG reviewed by Dr Quentin Ore.

## 2021-08-24 ENCOUNTER — Telehealth: Payer: Self-pay | Admitting: *Deleted

## 2021-08-24 NOTE — Telephone Encounter (Signed)
Coronary Stent scheduled at [facility] for:  Tuesday August 25, 2021 7:30 [facility] Main Entrance [facility]) at: 5:30 AM   No solid food after midnight prior to cath, clear liquids until 5 AM day of procedure.  Usual morning medications can be taken pre-cath with sips of water including: - aspirin 81 mg -Plavix 75 mg    Confirmed patient has responsible adult to drive home post procedure and be with patient first 24 hours after arriving home.   You are allowed one visitor in the waiting room during the time you are at the hospital for your procedure. Both you and your visitor must wear a mask once you enter the hospital.   Patient reports does not currently have any symptoms concerning for COVID-19 and no household members with COVID-19 like illness.                               Reviewed procedure/mask/visitor instructions with patient.

## 2021-08-25 ENCOUNTER — Other Ambulatory Visit: Payer: Self-pay

## 2021-08-25 ENCOUNTER — Ambulatory Visit (HOSPITAL_COMMUNITY)
Admission: RE | Admit: 2021-08-25 | Discharge: 2021-08-25 | Disposition: A | Discharge status: Home / Self Care | Payer: Medicare Other | Attending: Interventional Cardiology | Admitting: Interventional Cardiology

## 2021-08-25 ENCOUNTER — Encounter (HOSPITAL_COMMUNITY)
Admission: RE | Disposition: A | Discharge status: Home / Self Care | Payer: Self-pay | Source: Home / Self Care | Attending: Interventional Cardiology

## 2021-08-25 DIAGNOSIS — E785 Hyperlipidemia, unspecified: Secondary | ICD-10-CM | POA: Diagnosis present

## 2021-08-25 DIAGNOSIS — Z7982 Long term (current) use of aspirin: Secondary | ICD-10-CM | POA: Diagnosis not present

## 2021-08-25 DIAGNOSIS — I2510 Atherosclerotic heart disease of native coronary artery without angina pectoris: Secondary | ICD-10-CM

## 2021-08-25 DIAGNOSIS — K219 Gastro-esophageal reflux disease without esophagitis: Secondary | ICD-10-CM | POA: Diagnosis not present

## 2021-08-25 DIAGNOSIS — Z79899 Other long term (current) drug therapy: Secondary | ICD-10-CM | POA: Insufficient documentation

## 2021-08-25 DIAGNOSIS — R0609 Other forms of dyspnea: Secondary | ICD-10-CM | POA: Insufficient documentation

## 2021-08-25 DIAGNOSIS — I10 Essential (primary) hypertension: Secondary | ICD-10-CM | POA: Diagnosis present

## 2021-08-25 DIAGNOSIS — Z791 Long term (current) use of non-steroidal anti-inflammatories (NSAID): Secondary | ICD-10-CM | POA: Insufficient documentation

## 2021-08-25 DIAGNOSIS — Z955 Presence of coronary angioplasty implant and graft: Secondary | ICD-10-CM

## 2021-08-25 DIAGNOSIS — I25118 Atherosclerotic heart disease of native coronary artery with other forms of angina pectoris: Secondary | ICD-10-CM | POA: Diagnosis not present

## 2021-08-25 HISTORY — PX: LEFT HEART CATH AND CORONARY ANGIOGRAPHY: CATH118249

## 2021-08-25 HISTORY — PX: CORONARY STENT INTERVENTION: CATH118234

## 2021-08-25 HISTORY — PX: CORONARY ULTRASOUND/IVUS: CATH118244

## 2021-08-25 HISTORY — PX: PERIPHERAL INTRAVASCULAR LITHOTRIPSY: CATH118324

## 2021-08-25 LAB — POCT ACTIVATED CLOTTING TIME
Activated Clotting Time: 260 seconds
Activated Clotting Time: 219 seconds
Activated Clotting Time: 347 seconds

## 2021-08-25 SURGERY — CORONARY STENT INTERVENTION
Anesthesia: LOCAL

## 2021-08-25 MED ORDER — SODIUM CHLORIDE 0.9 % WEIGHT BASED INFUSION: 1.0000 mL/kg/h | INTRAVENOUS | Status: DC

## 2021-08-25 MED ORDER — IOHEXOL 350 MG/ML SOLN
INTRAVENOUS | Status: DC | PRN
  Administered 2021-08-25: 165 mL

## 2021-08-25 MED ORDER — HYDRALAZINE HCL 20 MG/ML IJ SOLN: 10.0000 mg | INTRAMUSCULAR | Status: DC | PRN

## 2021-08-25 MED ORDER — HEPARIN SODIUM (PORCINE) 1000 UNIT/ML IJ SOLN
INTRAMUSCULAR | Status: AC
  Filled 2021-08-25: qty 1

## 2021-08-25 MED ORDER — HEPARIN SODIUM (PORCINE) 1000 UNIT/ML IJ SOLN
INTRAMUSCULAR | Status: DC | PRN
  Administered 2021-08-25: 4000 [IU] via INTRAVENOUS
  Administered 2021-08-25: 6000 [IU] via INTRAVENOUS
  Administered 2021-08-25: 2000 [IU] via INTRAVENOUS

## 2021-08-25 MED ORDER — SODIUM CHLORIDE 0.9% FLUSH: 3.0000 mL | Freq: Two times a day (BID) | INTRAVENOUS | Status: DC

## 2021-08-25 MED ORDER — NITROGLYCERIN 0.4 MG SL SUBL: 0.4000 mg | SUBLINGUAL_TABLET | Freq: Once | SUBLINGUAL | Status: DC

## 2021-08-25 MED ORDER — SODIUM CHLORIDE 0.9% FLUSH: 3.0000 mL | INTRAVENOUS | Status: DC | PRN

## 2021-08-25 MED ORDER — HEPARIN (PORCINE) IN NACL 1000-0.9 UT/500ML-% IV SOLN
INTRAVENOUS | Status: AC
  Filled 2021-08-25: qty 500

## 2021-08-25 MED ORDER — SODIUM CHLORIDE 0.9 % WEIGHT BASED INFUSION
3.0000 mL/kg/h | INTRAVENOUS | Status: DC
  Administered 2021-08-25: 3 mL/kg/h via INTRAVENOUS

## 2021-08-25 MED ORDER — CLOPIDOGREL BISULFATE 75 MG PO TABS: 75.0000 mg | ORAL_TABLET | ORAL | Status: AC

## 2021-08-25 MED ORDER — LIDOCAINE HCL (PF) 1 % IJ SOLN
INTRAMUSCULAR | Status: DC | PRN
  Administered 2021-08-25: 2 mL

## 2021-08-25 MED ORDER — VERAPAMIL HCL 2.5 MG/ML IV SOLN
INTRAVENOUS | Status: DC | PRN
  Administered 2021-08-25: 10 mL via INTRA_ARTERIAL

## 2021-08-25 MED ORDER — VERAPAMIL HCL 2.5 MG/ML IV SOLN
INTRAVENOUS | Status: AC
  Filled 2021-08-25: qty 2

## 2021-08-25 MED ORDER — ACETAMINOPHEN 325 MG PO TABS: 650.0000 mg | ORAL_TABLET | ORAL | Status: DC | PRN

## 2021-08-25 MED ORDER — FENTANYL CITRATE (PF) 100 MCG/2ML IJ SOLN
INTRAMUSCULAR | Status: AC
  Filled 2021-08-25: qty 2

## 2021-08-25 MED ORDER — ONDANSETRON HCL 4 MG/2ML IJ SOLN: 4.0000 mg | Freq: Four times a day (QID) | INTRAMUSCULAR | Status: DC | PRN

## 2021-08-25 MED ORDER — NITROGLYCERIN 1 MG/10 ML FOR IR/CATH LAB
INTRA_ARTERIAL | Status: AC
  Filled 2021-08-25: qty 10

## 2021-08-25 MED ORDER — NITROGLYCERIN 1 MG/10 ML FOR IR/CATH LAB
INTRA_ARTERIAL | Status: DC | PRN
  Administered 2021-08-25: 150 ug via INTRACORONARY

## 2021-08-25 MED ORDER — HEPARIN (PORCINE) IN NACL 1000-0.9 UT/500ML-% IV SOLN
INTRAVENOUS | Status: DC | PRN
  Administered 2021-08-25 (×2): 500 mL

## 2021-08-25 MED ORDER — MIDAZOLAM HCL 2 MG/2ML IJ SOLN
INTRAMUSCULAR | Status: DC | PRN
  Administered 2021-08-25: 1 mg via INTRAVENOUS
  Administered 2021-08-25: 2 mg via INTRAVENOUS
  Administered 2021-08-25: 1 mg via INTRAVENOUS

## 2021-08-25 MED ORDER — SODIUM CHLORIDE 0.9 % IV SOLN: 250.0000 mL | INTRAVENOUS | Status: DC | PRN

## 2021-08-25 MED ORDER — SODIUM CHLORIDE 0.9 % IV SOLN: INTRAVENOUS | Status: AC

## 2021-08-25 MED ORDER — LIDOCAINE HCL (PF) 1 % IJ SOLN
INTRAMUSCULAR | Status: AC
  Filled 2021-08-25: qty 30

## 2021-08-25 MED ORDER — FENTANYL CITRATE (PF) 100 MCG/2ML IJ SOLN
INTRAMUSCULAR | Status: DC | PRN
  Administered 2021-08-25 (×3): 25 ug via INTRAVENOUS

## 2021-08-25 MED ORDER — CLOPIDOGREL BISULFATE 300 MG PO TABS
ORAL_TABLET | ORAL | Status: DC | PRN
  Administered 2021-08-25: 300 mg via ORAL

## 2021-08-25 MED ORDER — ROSUVASTATIN CALCIUM 20 MG PO TABS: 20.0000 mg | ORAL_TABLET | Freq: Every day | ORAL | 11 refills | Status: DC

## 2021-08-25 MED ORDER — LABETALOL HCL 5 MG/ML IV SOLN: 10.0000 mg | INTRAVENOUS | Status: DC | PRN

## 2021-08-25 MED ORDER — MIDAZOLAM HCL 2 MG/2ML IJ SOLN
INTRAMUSCULAR | Status: AC
  Filled 2021-08-25: qty 2

## 2021-08-25 MED ORDER — ASPIRIN 81 MG PO CHEW: 81.0000 mg | CHEWABLE_TABLET | ORAL | Status: AC

## 2021-08-25 SURGICAL SUPPLY — 25 items
BALLN SAPPHIRE ~~LOC~~ 4.0X15 (BALLOONS) ×1 IMPLANT
CATH INFINITI JR4 5F (CATHETERS) ×1 IMPLANT
CATH LAUNCHER 6FR EBU3.5 (CATHETERS) ×1 IMPLANT
CATH OPTICROSS HD (CATHETERS) ×1 IMPLANT
CATH SHOCKWAVE 3.0X12 (CATHETERS) IMPLANT
CATHETER SHOCKWAVE 3.0X12 (CATHETERS) ×3
DEVICE RAD TR BAND REGULAR (VASCULAR PRODUCTS) ×1 IMPLANT
ELECT DEFIB PAD ADLT CADENCE (PAD) ×1 IMPLANT
GLIDESHEATH SLEND SS 6F .021 (SHEATH) ×1 IMPLANT
GUIDEWIRE INQWIRE 1.5J.035X260 (WIRE) IMPLANT
INQWIRE 1.5J .035X260CM (WIRE) ×3
KIT ENCORE 26 ADVANTAGE (KITS) ×1 IMPLANT
KIT HEART LEFT (KITS) ×3 IMPLANT
KIT HEMO VALVE WATCHDOG (MISCELLANEOUS) ×1 IMPLANT
KIT MICROPUNCTURE NIT STIFF (SHEATH) ×1 IMPLANT
NDL PERC 21GX4CM (NEEDLE) IMPLANT
NEEDLE PERC 21GX4CM (NEEDLE) ×3 IMPLANT
PACK CARDIAC CATHETERIZATION (CUSTOM PROCEDURE TRAY) ×3 IMPLANT
SHEATH PINNACLE 6F 10CM (SHEATH) IMPLANT
SLED PULL BACK IVUS (MISCELLANEOUS) ×1 IMPLANT
STENT ONYX FRONTIER 3.0X38 (Permanent Stent) ×1 IMPLANT
TRANSDUCER W/STOPCOCK (MISCELLANEOUS) ×3 IMPLANT
TUBING CIL FLEX 10 FLL-RA (TUBING) ×4 IMPLANT
WIRE ASAHI PROWATER 180CM (WIRE) ×1 IMPLANT
WIRE HI TORQ BMW 190CM (WIRE) ×1 IMPLANT

## 2021-08-25 NOTE — Progress Notes (Signed)
Pt ambulated without difficulty or bleeding.   Discharged home with his sister-in-law who will drive and his wife who will stay with pt x 24 hrs.

## 2021-08-25 NOTE — Discharge Instructions (Signed)
Radial Site Care  This sheet gives you information about how to care for yourself after your procedure. Your health care provider may also give you more specific instructions. If you have problems or questions, contact your health care provider. What can I expect after the procedure? After the procedure, it is common to have: Bruising and tenderness at the catheter insertion area. Follow these instructions at home: Medicines Take over-the-counter and prescription medicines only as told by your health care provider. Insertion site care Follow instructions from your health care provider about how to take care of your insertion site. Make sure you: Wash your hands with soap and water before you remove your bandage (dressing). If soap and water are not available, use hand sanitizer. May remove dressing in 24 hours. Check your insertion site every day for signs of infection. Check for: Redness, swelling, or pain. Fluid or blood. Pus or a bad smell. Warmth. Do no take baths, swim, or use a hot tub for 5 days. You may shower 24-48 hours after the procedure. Remove the dressing and gently wash the site with plain soap and water. Pat the area dry with a clean towel. Do not rub the site. That could cause bleeding. Do not apply powder or lotion to the site. Activity  For 24 hours after the procedure, or as directed by your health care provider: Do not flex or bend the affected arm. Do not push or pull heavy objects with the affected arm. Do not drive yourself home from the hospital or clinic. You may drive 24 hours after the procedure. Do not operate machinery or power tools. KEEP ARM ELEVATED THE REMAINDER OF THE DAY. Do not push, pull or lift anything that is heavier than 10 lb for 5 days. Ask your health care provider when it is okay to: Return to work or school. Resume usual physical activities or sports. Resume sexual activity. General instructions If the catheter site starts to  bleed, raise your arm and put firm pressure on the site. If the bleeding does not stop, get help right away. This is a medical emergency. DRINK PLENTY OF FLUIDS FOR THE NEXT 2-3 DAYS. No alcohol consumption for 24 hours after receiving sedation. If you went home on the same day as your procedure, a responsible adult should be with you for the first 24 hours after you arrive home. Keep all follow-up visits as told by your health care provider. This is important. Contact a health care provider if: You have a fever. You have redness, swelling, or yellow drainage around your insertion site. Get help right away if: You have unusual pain at the radial site. The catheter insertion area swells very fast. The insertion area is bleeding, and the bleeding does not stop when you hold steady pressure on the area. Your arm or hand becomes pale, cool, tingly, or numb. These symptoms may represent a serious problem that is an emergency. Do not wait to see if the symptoms will go away. Get medical help right away. Call your local emergency services (911 in the [place] Do not drive yourself to the hospital. Summary After the procedure, it is common to have bruising and tenderness at the site. Follow instructions from your health care provider about how to take care of your radial site wound. Check the wound every day for signs of infection.  This information is not intended to replace advice given to you by your health care provider. Make sure you discuss any questions you have with   your health care provider. Document Revised: 12/21/2017 Document Reviewed: 12/21/2017 Elsevier Patient Education  2020 [employer]  

## 2021-08-25 NOTE — Discharge Summary (Addendum)
Discharge Summary for Same Day PCI   [name] MRN: [id number]; DOB: May 14, 1941  Admit date: 08/25/2021 Discharge date: 08/25/2021  Primary Care Provider: Haydee Salter, MD  Primary Cardiologist: Larae Grooms, MD  Primary Electrophysiologist:  None   Discharge Diagnoses    Principal Problem:   Coronary artery disease Active Problems:   Dyslipidemia   Essential hypertension  Diagnostic Studies/Procedures    Cardiac Catheterization 08/25/2021:  Prox LAD to Mid LAD lesion is 75% stenosed.   After shockwave lithotripsy, a drug-eluting stent was successfully placed using a STENT ONYX FRONTIER 3.0X38, postdilated to 4 mm.  The stent was optimized with intravascular ultrasound.   Post intervention, there is a 0% residual stenosis.   Moderate diffuse disease noted in the remainder of the LAD.   LV end diastolic pressure is normal.   There is no aortic valve stenosis.   Continue dual antiplatelet therapy for at least 6 months.  Continue aggressive secondary prevention and risk factor modification.  Plan for same-day PCI.   Left radial was used due to known tortuosity in the right subclavian.   Diagnostic Dominance: Right Intervention   _____________   History of Present Illness     David Goodman is a 80 y.o. male with PMH of HLD, HTN, PUD, GERD who presented to the office on 7/28 with progressive shortness of breath with Dr. Irish Lack.  Ultimately underwent coronary CTA which was abnormal with Ca+ score of 1331, 78th percentile, severe 3v CAD including pLAD, osital OM2 and dRCA. Recommendations for cardiac cath. Underwent cath on 8/30 with Dr. Irish Lack with severe multivessel CAD of LAD, diag, OM and mRCA. Films were reviewed with interventional team and decision was made to bring back for intervention to the LAD. He was loaded with plavix as an outpatient. Also switched to protonix with the need for plavix.   Hospital Course     The patient underwent cardiac  cath as noted above with PCI/DES x1 to the mLAD treated with shockwave lithotripsy. Moderate diffuse disease in the LAD to be treated medically. Plan for DAPT with ASA/plavix for at least 6 months. Left radial site used with known tortuosity of the right subclavian. The patient was seen by cardiac rehab while in short stay. There were no observed complications post cath. Radial cath site was re-evaluated prior to discharge and found to be stable without any complications. Instructions/precautions regarding cath site care were given prior to discharge. Added Crestor 20mg  daily at discharge as he was not on a statin PTA and now with CAD.   David Goodman was seen by Dr. Roseanne Reno and determined stable for discharge home. Follow up with our office has been arranged. Medications are listed below. Pertinent changes include N/a. _____________  Cath/PCI Registry Performance & Quality Measures: Aspirin prescribed? - Yes ADP Receptor Inhibitor (Plavix/Clopidogrel, Brilinta/Ticagrelor or Effient/Prasugrel) prescribed (includes medically managed patients)? - Yes High Intensity Statin (Lipitor 40-80mg  or Crestor 20-40mg ) prescribed? - Yes For EF <40%, was ACEI/ARB prescribed? - Not Applicable (EF >/= 98%) For EF <40%, Aldosterone Antagonist (Spironolactone or Eplerenone) prescribed? - Not Applicable (EF >/= 92%) Cardiac Rehab Phase II ordered (Included Medically managed Patients)? - Yes  _____________  Discharge Vitals Blood pressure (!) 150/80, pulse 71, temperature 97.9 F (36.6 C), temperature source Oral, resp. rate (!) 27, height 5' 4.5" (1.638 m), weight 79.8 kg, SpO2 99 %.  Filed Weights   08/25/21 0541  Weight: 79.8 kg    Last Labs & Radiologic Studies  CBC No results for input(s): WBC, NEUTROABS, HGB, HCT, MCV, PLT in the last 72 hours. Basic Metabolic Panel No results for input(s): NA, K, CL, CO2, GLUCOSE, BUN, CREATININE, CALCIUM, MG, PHOS in the last 72 hours. Liver Function Tests No  results for input(s): AST, ALT, ALKPHOS, BILITOT, PROT, ALBUMIN in the last 72 hours. No results for input(s): LIPASE, AMYLASE in the last 72 hours. High Sensitivity Troponin:   No results for input(s): TROPONINIHS in the last 720 hours.  BNP Invalid input(s): POCBNP D-Dimer No results for input(s): DDIMER in the last 72 hours. Hemoglobin A1C No results for input(s): HGBA1C in the last 72 hours. Fasting Lipid Panel No results for input(s): CHOL, HDL, LDLCALC, TRIG, CHOLHDL, LDLDIRECT in the last 72 hours. Thyroid Function Tests No results for input(s): TSH, T4TOTAL, T3FREE, THYROIDAB in the last 72 hours.  Invalid input(s): FREET3 _____________  CARDIAC CATHETERIZATION  Addendum Date: 08/25/2021     Prox LAD to Mid LAD lesion is 75% stenosed.   After shockwave lithotripsy, a drug-eluting stent was successfully placed using a STENT ONYX FRONTIER 3.0X38, postdilated to 4 mm.  The stent was optimized with intravascular ultrasound.   Post intervention, there is a 0% residual stenosis.   Moderate diffuse disease noted in the remainder of the LAD.   LV end diastolic pressure is normal.   There is no aortic valve stenosis. Continue dual antiplatelet therapy for at least 6 months.  Continue aggressive secondary prevention and risk factor modification.  Plan for same-day PCI. Left radial was used due to known tortuosity in the right subclavian.  Result Date: 08/25/2021   Prox LAD to Mid LAD lesion is 75% stenosed.   After shockwave lithotripsy, a drug-eluting stent was successfully placed using a STENT ONYX FRONTIER 3.0X38, postdilated to 4 mm.  The stent was optimized with intravascular ultrasound.   Post intervention, there is a 0% residual stenosis.   Moderate diffuse disease noted in the remainder of the LAD.   LV end diastolic pressure is normal.   There is no aortic valve stenosis. Continue dual antiplatelet therapy for at least 6 months.  Continue aggressive secondary prevention and risk factor  modification.  Plan for same-day PCI. Left radial was used due to known tortuosity in the right subclavian.   CARDIAC CATHETERIZATION  Result Date: 07/28/2021   Mid RCA lesion is 60% stenosed.   1st Mrg lesion is 80% stenosed.   Prox LAD to Mid LAD lesion is 75% stenosed.   Mid LAD lesion is 60% stenosed.   2nd Diag lesion is 80% stenosed.   LV end diastolic pressure is normal.   There is no aortic valve stenosis. Severe multivessel disease including LAD, diagonal, OM and moderate disease in the mid RCA.  Will discuss with colleagues whether PCI or CABG would be the best treatment. Continue medical therapy for LV dysfunction. Severe tortuosity in the right subclavian which did not straighten with destination sheath.  Would not use right radial approach in the future.   PERIPHERAL VASCULAR CATHETERIZATION  Addendum Date: 08/25/2021     Prox LAD to Mid LAD lesion is 75% stenosed.   After shockwave lithotripsy, a drug-eluting stent was successfully placed using a STENT ONYX FRONTIER 3.0X38, postdilated to 4 mm.  The stent was optimized with intravascular ultrasound.   Post intervention, there is a 0% residual stenosis.   Moderate diffuse disease noted in the remainder of the LAD.   LV end diastolic pressure is normal.   There is no  aortic valve stenosis. Continue dual antiplatelet therapy for at least 6 months.  Continue aggressive secondary prevention and risk factor modification.  Plan for same-day PCI. Left radial was used due to known tortuosity in the right subclavian.  Result Date: 08/25/2021   Prox LAD to Mid LAD lesion is 75% stenosed.   After shockwave lithotripsy, a drug-eluting stent was successfully placed using a STENT ONYX FRONTIER 3.0X38, postdilated to 4 mm.  The stent was optimized with intravascular ultrasound.   Post intervention, there is a 0% residual stenosis.   Moderate diffuse disease noted in the remainder of the LAD.   LV end diastolic pressure is normal.   There is no aortic valve  stenosis. Continue dual antiplatelet therapy for at least 6 months.  Continue aggressive secondary prevention and risk factor modification.  Plan for same-day PCI. Left radial was used due to known tortuosity in the right subclavian.    Disposition   Pt is being discharged home today in good condition.  Follow-up Plans & Appointments     Follow-up Information     Liliane Shi, PA-C Follow up on 09/08/2021.   Specialties: Cardiology, Physician Assistant Why: at 2:45pm for your follow up appt with Dr. Hassell Done PA. Contact information: [address] [place] [address] [phone]                Discharge Instructions     Amb Referral to Cardiac Rehabilitation   Complete by: As directed    Diagnosis: Coronary Stents   After initial evaluation and assessments completed: Virtual Based Care may be provided alone or in conjunction with Phase 2 Cardiac Rehab based on patient barriers.: Yes      Discharge Medications   Allergies as of 08/25/2021   No Known Allergies      Medication List     STOP taking these medications    naproxen 500 MG tablet Commonly known as: NAPROSYN       TAKE these medications    acetaminophen 500 MG tablet Commonly known as: TYLENOL Take 500 mg by mouth every 6 (six) hours as needed for headache.   aspirin EC 81 MG tablet Take 81 mg by mouth at bedtime. Swallow whole.   cetirizine 10 MG tablet Commonly known as: ZYRTEC Take 1 tablet (10 mg total) by mouth daily.   clopidogrel 75 MG tablet Commonly known as: PLAVIX Take 300 mg (4 tablets) for one dose then 75 mg (one tablet) by mouth daily What changed:  how much to take how to take this when to take this additional instructions   fluticasone 50 MCG/ACT nasal spray Commonly known as: FLONASE Place 2 sprays into both nostrils daily. What changed: when to take this   latanoprost 0.005 % ophthalmic solution Commonly known as: XALATAN Place 1 drop  into both eyes at bedtime.   losartan 100 MG tablet Commonly known as: COZAAR Take 1 tablet by mouth once daily   MAGNESIUM PO Take 400 mg by mouth at bedtime.   multivitamin with minerals Tabs tablet Take 1 tablet by mouth daily.   nitroGLYCERIN 0.4 MG SL tablet Commonly known as: NITROSTAT Place 1 tablet (0.4 mg total) under the tongue every 5 (five) minutes as needed for chest pain.   pantoprazole 40 MG tablet Commonly known as: PROTONIX Take 1 tablet (40 mg total) by mouth daily.   PARoxetine 10 MG tablet Commonly known as: PAXIL Take 20 mg by mouth daily.   rosuvastatin 20 MG tablet  Commonly known as: Crestor Take 1 tablet (20 mg total) by mouth daily.   tadalafil 20 MG tablet Commonly known as: Cialis Take 1 tablet (20 mg total) by mouth daily as needed for erectile dysfunction.   tamsulosin 0.4 MG Caps capsule Commonly known as: FLOMAX TAKE 1 CAPSULE BY MOUTH  DAILY   trolamine salicylate 10 % cream Commonly known as: ASPERCREME Apply 1 application topically as needed for muscle pain.   vitamin C 1000 MG tablet Take 1,000 mg by mouth 2 (two) times a week.        Allergies No Known Allergies  Outstanding Labs/Studies   FLP/LFTs  in 8 weeks  Duration of Discharge Encounter   Greater than 30 minutes including physician time.  Signed, Reino Bellis, NP 08/25/2021, 1:17 PM   I have examined the patient and reviewed assessment and plan and discussed with patient.  Agree with above as stated.    Left radial site stable. Continue Plavix and aggressive secondary prevention.  S/p successful IVL/stenting of LAD.  Plan for cardiac rehab.  He will take it easy with the left wrist. Same day discharge today.  Larae Grooms

## 2021-08-25 NOTE — Interval H&P Note (Signed)
Cath Lab Visit (complete for each Cath Lab visit)  Clinical Evaluation Leading to the Procedure:   ACS: No.  Non-ACS:    Anginal Classification: CCS III  Anti-ischemic medical therapy: Minimal Therapy (1 class of medications)  Non-Invasive Test Results: Intermediate-risk stress test findings: cardiac mortality 1-3%/year  Prior CABG: No previous CABG      History and Physical Interval Note:  08/25/2021 7:43 AM  David Goodman  has presented today for surgery, with the diagnosis of cad.  The various methods of treatment have been discussed with the patient and family. After consideration of risks, benefits and other options for treatment, the patient has consented to  Procedure(s): CORONARY STENT INTERVENTION (N/A) as a surgical intervention.  The patient's history has been reviewed, patient examined, no change in status, stable for surgery.  I have reviewed the patient's chart and labs.  Questions were answered to the patient's satisfaction.     Larae Grooms

## 2021-08-25 NOTE — Progress Notes (Signed)
CARDIAC REHAB PHASE I   Stent education completed with pt and wife. Pt educated on importance of Plavix and ASA. Pt given stent card along with heart healthy diet. Reviewed site care, restrictions, and exercise guidelines. Will refer to CRP II [place].  [id number] Rufina Falco, RN BSN 08/25/2021 11:27 AM

## 2021-08-26 ENCOUNTER — Encounter (HOSPITAL_COMMUNITY): Payer: Self-pay | Admitting: Interventional Cardiology

## 2021-08-26 ENCOUNTER — Telehealth: Payer: Self-pay

## 2021-08-26 NOTE — Telephone Encounter (Signed)
**Note De-identified  Obfuscation** -----  **Note De-Identified  Obfuscation** Message from Cheryln Manly, NP sent at 08/25/2021 12:49 PM EDT ----- Regarding: TOC call Needs TOC call, thx~

## 2021-08-26 NOTE — Telephone Encounter (Signed)
**Note De-Identified  Obfuscation** Patient contacted regarding discharge from [facility] on 08/25/2021.  Patient understands to follow up with Richardson Dopp, PA-c on 09/08/2021 at 2:45 at [address] [place] in [place], [place] [address]. Patient understands discharge instructions? Yes Patient understands medications and regiment? Yes Patient understands to bring all medications to this visit? Yes  Ask patient:  Are you enrolled in My Chart: Yes  The pt states that he is doing well this morning and is without c/o CP/discomfort, SOB, nausea, diaphoresis, dizziness, or pain at his cath site. He is removing his bandage later today and is aware to call Dr Hassell Done office if he has any questions or concerns. He thanked me for my call.

## 2021-08-27 ENCOUNTER — Telehealth: Payer: Self-pay | Admitting: *Deleted

## 2021-08-27 ENCOUNTER — Telehealth (HOSPITAL_COMMUNITY): Payer: Self-pay

## 2021-08-27 NOTE — Telephone Encounter (Signed)
Attempted to call patient in regards to Cardiac Rehab - LM on VM 

## 2021-08-27 NOTE — Telephone Encounter (Signed)
Pt insurance is active and benefits verified through [facility] Medicare Co-pay 0, DED 0/0 met, out of pocket $4,500/$1,179.45 met, co-insurance 0%. no pre-authorization required. Passport, 08/27/2021@4 :15pm, REF# [id number]   Will contact patient to see if he is interested in the Cardiac Rehab Program. If interested, patient will need to complete follow up appt. Once completed, patient will be contacted for scheduling upon review by the RN Navigator.

## 2021-08-27 NOTE — Telephone Encounter (Signed)
Transition Care Management Unsuccessful Follow-up Telephone Call  Date of discharge and from where:  David Goodman 08-25-2021  Attempts:  1st Attempt  Reason for unsuccessful TCM follow-up call:  Left voice message

## 2021-08-28 ENCOUNTER — Encounter: Payer: Self-pay | Admitting: *Deleted

## 2021-08-28 DIAGNOSIS — Z006 Encounter for examination for normal comparison and control in clinical research program: Secondary | ICD-10-CM

## 2021-09-01 ENCOUNTER — Encounter (HOSPITAL_COMMUNITY): Payer: Self-pay | Admitting: Radiology

## 2021-09-01 DIAGNOSIS — H524 Presbyopia: Secondary | ICD-10-CM | POA: Diagnosis not present

## 2021-09-01 DIAGNOSIS — H2511 Age-related nuclear cataract, right eye: Secondary | ICD-10-CM | POA: Diagnosis not present

## 2021-09-01 DIAGNOSIS — H26492 Other secondary cataract, left eye: Secondary | ICD-10-CM | POA: Diagnosis not present

## 2021-09-01 DIAGNOSIS — H401111 Primary open-angle glaucoma, right eye, mild stage: Secondary | ICD-10-CM | POA: Diagnosis not present

## 2021-09-01 DIAGNOSIS — H401122 Primary open-angle glaucoma, left eye, moderate stage: Secondary | ICD-10-CM | POA: Diagnosis not present

## 2021-09-01 LAB — HM DIABETES EYE EXAM

## 2021-09-02 ENCOUNTER — Encounter: Payer: Self-pay | Admitting: Family Medicine

## 2021-09-03 IMAGING — CR DG CHEST 2V
2 series · 2 of 2 positions shown · non-contrast
Comparison: 12/13/2019

CLINICAL DATA: Dyspnea, recent abnormal chest x-ray

EXAM:
CHEST - 2 VIEW

[w chest pa]
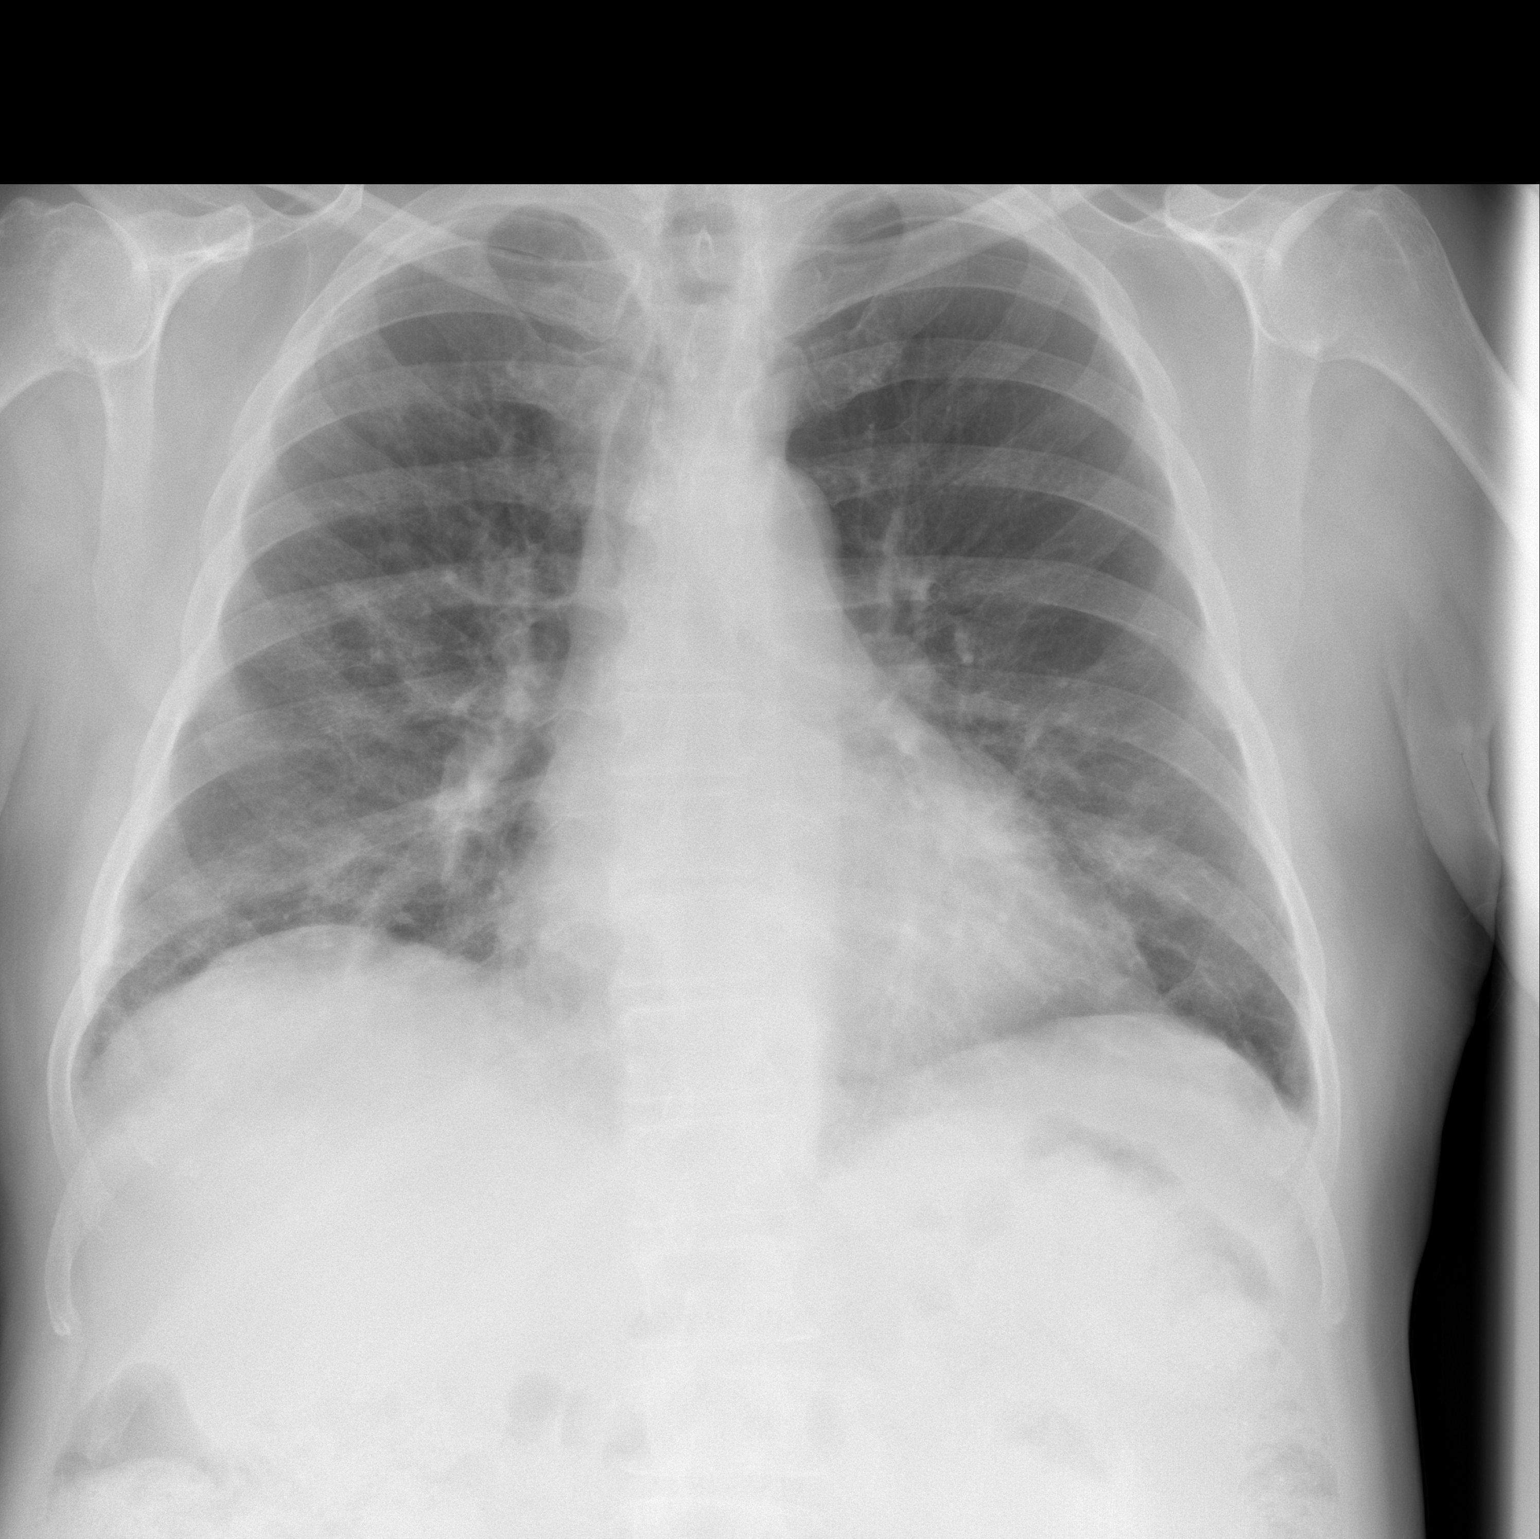

[w chest lat]
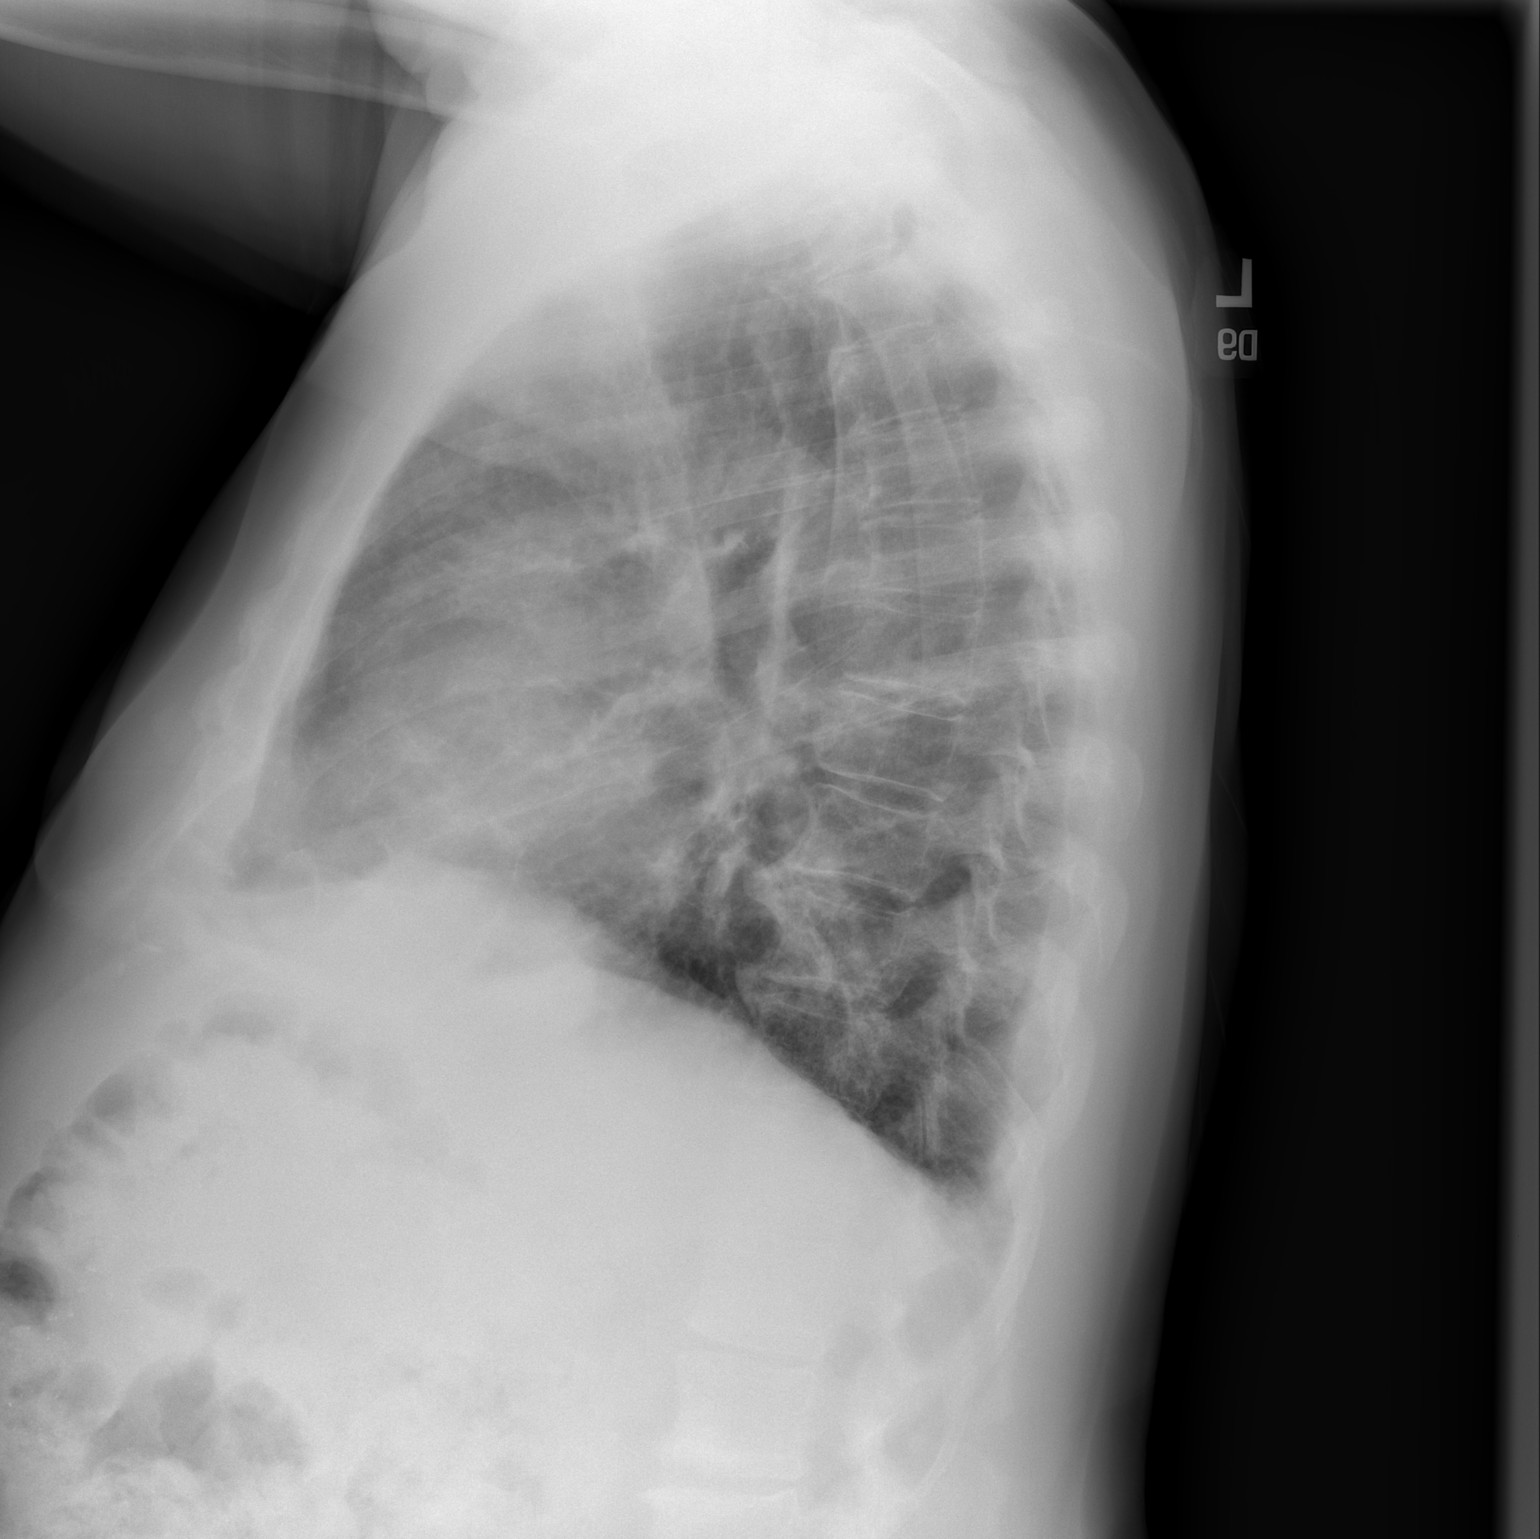

[2 of 2 positions shown; findings below may reference images not displayed]

FINDINGS: Frontal and lateral views of the chest demonstrate a stable cardiac
silhouette. Allowing for differences in technique, the bibasilar
interstitial and ground-glass opacity seen previously are not
significantly changed. No effusion or pneumothorax.
IMPRESSION: 1. Stable bibasilar interstitial and ground-glass opacities, which
could reflect persistent pneumonia or postinflammatory scarring.

## 2021-09-03 NOTE — Research (Signed)
Identify Informed Consent   Subject Name: David Goodman  Subject met inclusion and exclusion criteria.  The informed consent form, study requirements and expectations were reviewed with the subject and questions and concerns were addressed prior to the signing of the consent form.  The subject verbalized understanding of the trial requirements.  The subject agreed to participate in the Identify trial and signed the informed consent at [facility] on 28-Jul-2021.  The informed consent was obtained prior to performance of any protocol-specific procedures for the subject.  A copy of the signed informed consent was given to the subject and a copy was placed in the subject's medical record.   Carlos American Abelardo Seidner

## 2021-09-08 ENCOUNTER — Encounter: Payer: Self-pay | Admitting: Physician Assistant

## 2021-09-08 ENCOUNTER — Ambulatory Visit: Payer: Medicare Other | Admitting: Physician Assistant

## 2021-09-08 ENCOUNTER — Other Ambulatory Visit: Payer: Self-pay

## 2021-09-08 VITALS — BP 120/70 | HR 86 | Ht 64.5 in | Wt 179.8 lb

## 2021-09-08 DIAGNOSIS — I493 Ventricular premature depolarization: Secondary | ICD-10-CM

## 2021-09-08 DIAGNOSIS — I5020 Unspecified systolic (congestive) heart failure: Secondary | ICD-10-CM

## 2021-09-08 DIAGNOSIS — I2510 Atherosclerotic heart disease of native coronary artery without angina pectoris: Secondary | ICD-10-CM

## 2021-09-08 DIAGNOSIS — E7800 Pure hypercholesterolemia, unspecified: Secondary | ICD-10-CM | POA: Diagnosis not present

## 2021-09-08 DIAGNOSIS — I10 Essential (primary) hypertension: Secondary | ICD-10-CM | POA: Diagnosis not present

## 2021-09-08 MED ORDER — CLOPIDOGREL BISULFATE 75 MG PO TABS: 75.0000 mg | ORAL_TABLET | Freq: Every day | ORAL | 3 refills | Status: DC

## 2021-09-08 MED ORDER — METOPROLOL SUCCINATE ER 25 MG PO TB24: 12.5000 mg | ORAL_TABLET | Freq: Every day | ORAL | 3 refills | Status: DC

## 2021-09-08 NOTE — Patient Instructions (Signed)
Medication Instructions:   START Toprol XL one half tablet by mouth ( 12.5 mg) daily  *If you need a refill on your cardiac medications before your next appointment, please call your pharmacy*   Lab Work:  TODAY!!!!! BMET/MAG!!!  Your physician recommends that you return for a FASTING lipid profile/cmet same day as echo.  Fasting from midnight the night before.    If you have labs (blood work) drawn today and your tests are completely normal, you will receive your results only by: [place] (if you have MyChart) OR A paper copy in the mail If you have any lab test that is abnormal or we need to change your treatment, we will call you to review the results.   Testing/Procedures: Your physician has requested that you have an echocardiogram. Echocardiography is a painless test that uses sound waves to create images of your heart. It provides your doctor with information about the size and shape of your heart and how well your heart's chambers and valves are working. This procedure takes approximately one hour. There are no restrictions for this procedure.   Follow-Up: At [facility], you and your health needs are our priority.  As part of our continuing mission to provide you with exceptional heart care, we have created designated Provider Care Teams.  These Care Teams include your primary Cardiologist (physician) and Advanced Practice Providers (APPs -  Physician Assistants and Nurse Practitioners) who all work together to provide you with the care you need, when you need it.  We recommend signing up for the patient portal called "MyChart".  Sign up information is provided on this After Visit Summary.  MyChart is used to connect with patients for Virtual Visits (Telemedicine).  Patients are able to view lab/test results, encounter notes, upcoming appointments, etc.  Non-urgent messages can be sent to your provider as well.   To learn more about what you can do with MyChart, go to  [email or web address].    Your next appointment:   3 month(s)  The format for your next appointment:   In Person  Provider:   Casandra Doffing, MD   Other Instructions

## 2021-09-08 NOTE — Progress Notes (Signed)
Cardiology Office Note:    Date:  09/08/2021   ID:  Doc Mandala, DOB 01/12/1941, MRN [id number]  PCP:  Haydee Salter, MD   [facility] Providers Cardiologist:  Larae Grooms, MD    Referring MD: Haydee Salter, MD   Chief Complaint:  Hospitalization Follow-up (S/p Cath, PCI)    Patient Profile:   Veryl Winemiller is a 80 y.o. male with:  Coronary artery disease  S/p DES to p-mLAD in 9/22 Cath 9/22: OM1 80, D2 80, dLAD 50, mRCA 60 >> Med Rx HFmrEF (heart failure with mildly reduced ejection fraction)  Echocardiogram 8/22: 40-45, Gr 1 DD, global HK Mild to mod MR (echocardiogram 8/22) Hypertension  Hyperlipidemia  Glucose intolerance GERD/PUD Iron deficiency anemia Aortic atherosclerosis   Prior CV studies: CORONARY STENT INTERVENTION, INTRAVASCULAR LITHOTRIPSY 08/25/2021 Narrative   Prox LAD to Mid LAD lesion is 75% stenosed.   After shockwave lithotripsy, a drug-eluting stent was successfully placed using a STENT ONYX FRONTIER 3.0X38, postdilated to 4 mm.  The stent was optimized with intravascular ultrasound.   Post intervention, there is a 0% residual stenosis.   Moderate diffuse disease noted in the remainder of the LAD.   LV end diastolic pressure is normal.   There is no aortic valve stenosis.   Cardiac catheterization    Mid RCA lesion is 60% stenosed.   1st Mrg lesion is 80% stenosed.   Prox LAD to Mid LAD lesion is 75% stenosed.   Mid LAD lesion is 60% stenosed.   2nd Diag lesion is 80% stenosed.   LV end diastolic pressure is normal.   There is no aortic valve stenosis. Severe multivessel disease including LAD, diagonal, OM and moderate disease in the mid RCA.  Will discuss with colleagues whether PCI or CABG would be the best treatment. Continue medical therapy for LV dysfunction.    ECHO COMPLETE WO IMAGING ENHANCING AGENT 07/07/2021 EF 40-45, global HK, mild LVH, GR 1 DD, normal RVSF, mild LAE, mild-moderate MR, mild AI, trivial TR, mild  PI    History of Present Illness: Mr. Fick was evaluated by Dr. Irish Lack in July 2022 for shortness of breath and a history of mild LV dysfunction.  A follow-up echocardiogram demonstrated EF 40-45.  The patient was set up for coronary CTA which demonstrated severe 3v CAD in the pLAD, oOM2 and dRCA.  He was set up for cardiac catheterization which demonstrated severe multivessel CAD with proximal LAD 75%, mid LAD 60%, second diagonal 80%, OM1 80% and mid RCA 60%.  Films were reviewed with interventional colleagues to determine CABG versus PCI.  It was ultimately decided to bring the patient back for PCI of the LAD.  He underwent shockwave lithotripsy followed by DES to the proximal-mid LAD on 08/25/2021.  He was discharged the same day.  He returns for follow-up.  He is here alone.  Since his procedure, he has noted significant improvement in his breathing.  He has not had chest pain, orthopnea, leg edema, syncope.  He does have some issues with balance.    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   Ascorbic Acid (VITAMIN C) 1000 MG tablet Take 1,000 mg by mouth 2 (two) times a week.   aspirin EC 81 MG tablet Take 81 mg by mouth at bedtime. Swallow whole.   cetirizine (ZYRTEC) 10 MG tablet Take 1 tablet (10 mg total) by mouth daily.   fluticasone (FLONASE) 50 MCG/ACT nasal spray Place 2 sprays into both nostrils daily. (Patient taking differently: Place 2 sprays into both nostrils 2 (two) times daily.)    latanoprost (XALATAN) 0.005 % ophthalmic solution Place 1 drop into both eyes at bedtime.   losartan (COZAAR) 100 MG tablet Take 1 tablet by mouth once daily   MAGNESIUM PO Take 400 mg by mouth at bedtime.   metoprolol succinate (TOPROL XL) 25 MG 24 hr tablet Take 0.5 tablets (12.5 mg total) by mouth daily.   Multiple Vitamin (MULTIVITAMIN WITH MINERALS) TABS tablet Take 1 tablet by mouth daily.   naproxen (NAPROSYN) 500 MG tablet Take 500 mg by mouth as needed. For knee pain   nitroGLYCERIN (NITROSTAT) 0.4 MG SL tablet Place 1 tablet (0.4 mg total) under the tongue every 5 (five) minutes as needed for chest pain.   pantoprazole (PROTONIX) 40 MG tablet Take 1 tablet (40 mg total) by mouth daily.   PARoxetine (PAXIL) 10 MG tablet Take 20 mg by mouth daily.   rosuvastatin (CRESTOR) 20 MG tablet Take 1 tablet (20 mg total) by mouth daily.   tadalafil (CIALIS) 20 MG tablet Take 1 tablet (20 mg total) by mouth daily as needed for erectile dysfunction.   tamsulosin (FLOMAX) 0.4 MG CAPS capsule TAKE 1 CAPSULE BY MOUTH  DAILY   trolamine salicylate (ASPERCREME) 10 % cream Apply 1 application topically as needed for muscle pain.   [DISCONTINUED] clopidogrel (PLAVIX) 75 MG tablet Take 300 mg (4 tablets) for one dose then 75 mg (one tablet) by mouth daily (Patient taking differently: Take 75 mg by mouth daily.)    Allergies:   Patient has no known allergies.   Social History   Tobacco Use   Smoking status: Never   Smokeless tobacco: Never  Vaping Use   Vaping Use: Never used  Substance Use Topics   Alcohol use: Yes    Comment: couple times a week - wine   Drug use: No    Family Hx: The patient's family history includes Cancer in his brother.  Review of Systems  Gastrointestinal:  Negative for hematochezia.  Genitourinary:  Negative for hematuria.    EKGs/Labs/Other Test Reviewed:    EKG:  EKG is  ordered today.  The ekg ordered today demonstrates normal sinus rhythm, heart rate 86, normal  axis, PVC, no ST-T wave changes, QTC 469  Recent Labs: 05/11/2021: TSH 3.93 06/04/2021: ALT 18 08/18/2021: BUN 20; Creatinine, Ser 0.91; Hemoglobin 13.6; Platelets 243; Potassium 4.3; Sodium 134   Recent Lipid Panel Lab Results  Component Value Date/Time   CHOL 209 (H) 02/27/2021 09:50 AM   TRIG 232.0 (H) 02/27/2021 09:50 AM   HDL 52.90 02/27/2021 09:50 AM   LDLCALC 99 10/31/2018 08:20 AM   LDLDIRECT 129.0 02/27/2021 09:50 AM    Risk Assessment/Calculations:          Physical Exam:    VS:  BP 120/70 (BP Location: Left Arm, Patient Position: Sitting, Cuff Size: Normal)   Pulse 86   Ht 5' 4.5" (1.638 m)   Wt 179 lb 12.8 oz (81.6 kg)  BMI 30.39 kg/m     Wt Readings from Last 3 Encounters:  09/08/21 179 lb 12.8 oz (81.6 kg)  08/25/21 176 lb (79.8 kg)  07/22/21 173 lb (78.5 kg)    Constitutional:      Appearance: Healthy appearance. Not in distress.  Neck:     Vascular: JVD normal.  Pulmonary:     Effort: Pulmonary effort is normal.     Breath sounds: No wheezing. No rales.  Cardiovascular:     Normal rate. Regular rhythm. Normal S1. Normal S2.      Murmurs: There is no murmur.     Comments: Right and left wrist without hematoma Edema:    Peripheral edema absent.  Abdominal:     Palpations: Abdomen is soft.  Skin:    General: Skin is warm and dry.  Neurological:     General: No focal deficit present.     Mental Status: Alert and oriented to person, place and time.     Cranial Nerves: Cranial nerves are intact.        ASSESSMENT & PLAN:   1. Coronary artery disease involving native coronary artery of native heart without angina pectoris Status post recent shockwave lithotripsy followed by DES to the LAD.  He does have residual disease with 80% OM1, 80% D2, 50% distal LAD and 60% mid RCA stenosis.  This is to be treated medically.  He is doing well since his PCI.  His breathing is significantly improved.  He has not had chest pain.  We discussed the importance of  dual antiplatelet therapy as well as cardiac rehabilitation.  Continue aspirin 81 mg daily, clopidogrel 75 mg daily, rosuvastatin 20 mg daily.  Follow-up with Dr. Irish Lack in 3 months.  2. HFmrEF (heart failure with mildy reduced EF) EF 40-45 by echocardiogram prior to cardiac catheterization.  NYHA II.  Volume status stable.  Continue losartan 100 mg daily.  Add beta-blocker with metoprolol succinate 12.5 mg daily.  Plan follow-up limited echo in 3 months to recheck EF.  3. Essential hypertension Blood pressure controlled.  As noted, I will add beta-blocker to his medical regimen at a very low dose.  Continue losartan 100 mg daily.  4. Pure hypercholesterolemia Continue rosuvastatin 20 mg daily.  Obtain fasting CMET, lipids in 3 months.  5. PVC's (premature ventricular contractions) Noted on electrocardiogram.  Given his recent cardiac catheterizations, I will recheck a BMET today as well as a magnesium.   Cardiac Rehabilitation Eligibility Assessment: The patient is ready to start Cardiac Rehab from a cardiac standpoint.       Dispo:  Return in about 3 months (around 12/09/2021) for Routine Follow Up with Dr. Irish Lack.   Medication Adjustments/Labs and Tests Ordered: Current medicines are reviewed at length with the patient today.  Concerns regarding medicines are outlined above.  Tests Ordered: Orders Placed This Encounter  Procedures   Basic Metabolic Panel (BMET)   Magnesium   Comp Met (CMET)   Lipid Profile   EKG 12-Lead   ECHOCARDIOGRAM LIMITED    Medication Changes: Meds ordered this encounter  Medications   metoprolol succinate (TOPROL XL) 25 MG 24 hr tablet    Sig: Take 0.5 tablets (12.5 mg total) by mouth daily.    Dispense:  45 tablet    Refill:  3   clopidogrel (PLAVIX) 75 MG tablet    Sig: Take 1 tablet (75 mg total) by mouth daily. Take one tablet by mouth ( 75 mg) daily.    Dispense:  90 tablet    Refill:  3    Signed, Richardson Dopp, PA-C  09/08/2021 4:41  PM    [facility] [address], [place], [place]  [address] Phone: [phone]; Fax: [phone]

## 2021-09-09 LAB — BASIC METABOLIC PANEL
BUN/Creatinine Ratio: 16 (ref 10–24)
BUN: 18 mg/dL (ref 8–27)
CO2: 22 mmol/L (ref 20–29)
Calcium: 8.9 mg/dL (ref 8.6–10.2)
Chloride: 97 mmol/L (ref 96–106)
Creatinine, Ser: 1.15 mg/dL (ref 0.76–1.27)
Glucose: 102 mg/dL — ABNORMAL HIGH (ref 70–99)
Potassium: 4 mmol/L (ref 3.5–5.2)
Sodium: 132 mmol/L — ABNORMAL LOW (ref 134–144)
eGFR: 65 mL/min/{1.73_m2} (ref >59)

## 2021-09-09 LAB — MAGNESIUM: Magnesium: 2.1 mg/dL (ref 1.6–2.3)

## 2021-09-11 ENCOUNTER — Other Ambulatory Visit: Payer: Self-pay | Admitting: *Deleted

## 2021-09-11 DIAGNOSIS — E871 Hypo-osmolality and hyponatremia: Secondary | ICD-10-CM

## 2021-09-21 ENCOUNTER — Encounter: Payer: Self-pay | Admitting: Neurology

## 2021-09-21 ENCOUNTER — Ambulatory Visit: Payer: Medicare Other | Admitting: Neurology

## 2021-09-21 ENCOUNTER — Ambulatory Visit: Payer: Medicare Other

## 2021-09-21 VITALS — BP 167/84 | HR 75 | Ht 64.5 in | Wt 179.5 lb

## 2021-09-21 DIAGNOSIS — R0683 Snoring: Secondary | ICD-10-CM | POA: Insufficient documentation

## 2021-09-21 DIAGNOSIS — R0602 Shortness of breath: Secondary | ICD-10-CM | POA: Insufficient documentation

## 2021-09-21 DIAGNOSIS — G2581 Restless legs syndrome: Secondary | ICD-10-CM | POA: Diagnosis not present

## 2021-09-21 DIAGNOSIS — R6889 Other general symptoms and signs: Secondary | ICD-10-CM | POA: Diagnosis not present

## 2021-09-21 DIAGNOSIS — I2510 Atherosclerotic heart disease of native coronary artery without angina pectoris: Secondary | ICD-10-CM

## 2021-09-21 DIAGNOSIS — G4719 Other hypersomnia: Secondary | ICD-10-CM | POA: Diagnosis not present

## 2021-09-21 NOTE — Patient Instructions (Addendum)
n short, David Goodman is presenting with an interesting sleep history, needs screening for RLS, narcolepsy and apnea.  SPLIT study -if AHI under 10 stay for MSLT, Paxil  will be weaned off in preparation-  10 mg from today on, than d/c in another 3 weeks.   I plan to follow up either personally or through our NP within 2-4 month.    Narcolepsy Narcolepsy is a neurological disorder that causes people to fall asleep suddenly and without control (have sleep attacks) during the daytime. It is a lifelong disorder. Narcolepsy disrupts the sleep cycle at night, which then causes daytime sleepiness. What are the causes? The cause of narcolepsy is not fully understood, but it may be related to: Low levels of hypocretin, a chemical (neurotransmitter) in the brain that controls sleep and wake cycles. Hypocretin imbalance may be caused by: Abnormal genes that are passed from parent to child (inherited). An autoimmune disease in which the body's defense system (immune system) attacks the brain cells that make hypocretin. Infection, tumor, or injury in the area of the brain that controls sleep. Exposure to poisons (toxins), such as heavy metals, pesticides, and secondhand smoke. What are the signs or symptoms? Symptoms of this condition include: Excessive daytime sleepiness. This is the most common symptom and is usually the first symptom you will notice. This may affect your performance at work or school. Sleep attacks. You may fall asleep in the middle of an activity, especially low-energy activities like reading or watching TV. Feeling like you cannot think clearly and trouble focusing or remembering things. You may also feel depressed. Sudden muscle weakness (cataplexy). When this occurs, your speech may become slurred, or your knees may buckle. Cataplexy is usually triggered by surprise, anger, fear, or laughter. Losing the ability to speak or move (sleep paralysis). This may occur just as you start to  fall asleep or wake up. You will be aware of the paralysis. It usually lasts for just a few seconds or minutes. Seeing, hearing, tasting, smelling, or feeling things that are not real (hallucinations). Hallucinations may occur with sleep paralysis. They can happen when you are falling asleep, waking up, or dozing. Trouble staying asleep at night (insomnia) and restless sleep. How is this diagnosed? This condition may be diagnosed based on: A physical exam to rule out any other problems that may be causing your symptoms. You may be asked to write down your sleeping patterns for several weeks in a sleep diary. This will help your health care provider make a diagnosis. Sleep studies that measure how well your REM sleep is regulated. These tests also measure your heart rate, breathing, movement, and brain waves. These tests include: An overnight sleep study (polysomnogram). A daytime sleep study that is done while you take several naps during the day (multiple sleep latency test, MSLT). This test measures how quickly you fall asleep and how quickly you enter REM sleep. Removal of spinal fluid to measure hypocretin levels. How is this treated? There is no cure for this condition, but treatment can help relieve symptoms. Treatment may include: Lifestyle and sleeping strategies to help you cope with the condition, such as: Exercising regularly. Maintaining a regular sleep schedule. Avoiding caffeine and large meals before bed. Medicines. These may include: Medicines that help keep you awake and alert (stimulants) to fight daytime sleepiness. Medicines that treat depression (antidepressants). These may be used to treat cataplexy. Sodium oxybate. This is a strong medicine to help you relax (sedative) that you may take at  night. It can help control daytime sleepiness and cataplexy. Other treatments may include mental health counseling or joining a support group. Follow these instructions at  home: Sleeping habits  Get about 8 hours of sleep every night. Go to sleep and get up at about the same time every day. Keep your bedroom dark, quiet, and comfortable. When you feel very tired, take short naps. Schedule naps so that you take them at about the same time every day. Before bedtime: Avoid bright lights and screens. Relax. Try activities like reading or taking a warm bath. Activity Get at least 20 minutes of exercise every day. This will help you sleep better at night and reduce daytime sleepiness. Avoid exercising within 3 hours of bedtime. Do not drive or use heavy machinery if you are sleepy. If possible, take a nap before driving. Do not swim or go out on the water without a life jacket. Eating and drinking Do not drink alcohol or caffeinated beverages within 4-5 hours of bedtime. Do not eat a large meal before bedtime. Eat meals at about the same times every day. General instructions  Take over-the-counter and prescription medicines only as told by your health care provider. Keep a sleep diary as told by your health care provider. Tell your employer or teachers that you have narcolepsy. You may be able to adjust your schedule to include time for naps. Do not use any products that contain nicotine or tobacco, such as cigarettes, e-cigarettes, and chewing tobacco. If you need help quitting, ask your health care provider. Keep all follow-up visits as told by your health care provider. This is important. Where to find more information [employer] of Neurological Disorders: [employer] Contact a health care provider if: Your symptoms are not getting better. You have increasingly high blood pressure (hypertension). You have changes in your heart rhythm. You are having a hard time determining what is real and what is not (psychosis). Get help right away if you: Hurt yourself during a sleep attack or an attack of cataplexy. Have chest pain. Have trouble  breathing. These symptoms may represent a serious problem that is an emergency. Do not wait to see if the symptoms will go away. Get medical help right away. Call your local emergency services (911 in the [place] Do not drive yourself to the hospital. Summary Narcolepsy is a neurological disorder that causes people to fall asleep suddenly, and without control, during the daytime (sleep attacks). It is a lifelong disorder. There is no cure for this condition, but treatment can help relieve symptoms. Go to sleep and get up at about the same time every day. Follow instructions about sleep and activities as told by your health care provider. Take over-the-counter and prescription medicines only as told by your health care provider. This information is not intended to replace advice given to you by your health care provider. Make sure you discuss any questions you have with your health care provider. Document Revised: 06/27/2019 Document Reviewed: 06/27/2019 Elsevier Patient Education  [employer].

## 2021-09-21 NOTE — Progress Notes (Addendum)
SLEEP MEDICINE CLINIC    Provider:  Larey Seat, MD  Primary Care Physician:  Haydee Salter, MD [place] [address]     Referring Provider: Haydee Salter, [place],  [place] [address]          Chief Complaint according to patient   Patient presents with:     New Patient (Initial Visit)     Internal referral for non restorative sleep. Pt reports having vivid dreams and is waking up tired in the morning. Pt reports snoring. Pt reports being SOB, is exercising to help his overall health.        HISTORY OF PRESENT ILLNESS: 09-21-2021. David Goodman is a 80 y.o. patient seen here upon PCP  referral on 09/21/2021 .  Chief concern according to patient:  Just last month diagnosed wit CAD, reports vivid dreams, lots of them, and and even during naps. Has been EDS. Internal referral for non restorative sleep. Pt reports having vivid dreams and is waking up tired in the morning. Pt reports snoring,  being SOB, dx with CAD, is exercising to help his overall health.   He also dreams vividly until the very moment he wakes up and when he goes to the bathd room he can continue the same dream.    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    Sleep relevant medical history: Nocturia, vivid dreams, CAD, SOB, hearing aid.    Family medical /sleep history: no other family member on CPAP with OSA, insomnia, sleep walkers.    Social history:  Patient is working as Regulatory affairs officer, Mining engineer, and plans to fully retire soon-   and lives in a  household with spouse, no children, raised , 5 stepchildren persons. Family status is remarried. Pets are not present. Tobacco use: none  ETOH use; 4 drinks a week-,  Caffeine intake in form of Coffee( 1 cup in AM ) . Regular exercise: not currently .        Sleep habits are as follows: The patient's dinner time is between 4.30 PM. The patient goes to bed at 10.30 PM - he sometimes watches TV- and continues to sleep for 2-4 hours, wakes for one bathroom breaks, the first time at 2 AM.   The preferred sleep position is right sided, with the support of one  pillow. Dreams are reportedly very frequent/vivid. Some times nightmarish.  5.30  AM is the usual rise time. The patient wakes up spontaneously/.  He reports not feeling refreshed or restored in AM, with symptoms such as dry mouth, morning headaches, and residual fatigue.  Naps are taken frequently on weekends, lasting from 15-20  minutes and are more refreshing than nocturnal sleep. He dreams in his naps.    Review of Systems: Out of a complete 14 system review, the patient complains of only the following symptoms, and all other reviewed systems are negative.:  Fatigue, sleepiness , snoring, vivid dreams.    How likely are you to doze in the following situations: 0 = not likely, 1 = slight chance, 2 = moderate chance, 3 = high chance   Sitting and Reading? Watching Television? Sitting inactive in  a public place (theater or meeting)? As a passenger in a car for an hour without a break? Lying down in the afternoon when circumstances permit? Sitting and talking to someone? Sitting quietly after lunch without alcohol? In a car, while stopped for a few minutes in traffic?   Total = 10/ 24 points   FSS endorsed at 19/ 63 points.     David Goodman is a 80 y.o. male with:  Coronary artery disease  S/p DES to p-mLAD in 9/22 Cath 9/22: OM1 80, D2 80, dLAD 50, mRCA 60 >> Med Rx HFmrEF (heart failure with mildly reduced ejection fraction)   Echocardiogram 8/22: 40-45, Gr 1 DD, global HK Mild to mod MR (echocardiogram 8/22) Hypertension  Hyperlipidemia  Glucose intolerance GERD/PUD Iron deficiency anemia Aortic atherosclerosis    Prior CV studies: CORONARY STENT INTERVENTION, INTRAVASCULAR LITHOTRIPSY 08/25/2021 Narrative   Prox LAD to Mid LAD lesion is 75% stenosed.   After shockwave lithotripsy, a drug-eluting stent was successfully placed using a STENT ONYX FRONTIER [id number], postdilated to 4 mm.  The stent was optimized with intravascular ultrasound.   Post intervention, there is a 0% residual stenosis.   Moderate diffuse disease noted in the remainder of the LAD.   LV end diastolic pressure is normal.   There is no aortic valve stenosis.  Social History   Socioeconomic History   Marital status: Married    Spouse name: Not on file   Number of children: Not on file   Years of education: Not on file   Highest education level: Not on file  Occupational History   Not on file  Tobacco Use   Smoking status: Never   Smokeless tobacco: Never  Vaping Use   Vaping Use: Never used  Substance and Sexual Activity   Alcohol use: Yes    Comment: couple times a week - wine   Drug use: No   Sexual activity: Not Currently  Other Topics Concern   Not on file  Social History Narrative   Lives alone   Right handed   Caffeine: 1 cup of coffee a day   Social Determinants of Health   Financial Resource Strain: Low Risk    Difficulty of Paying Living Expenses: Not hard at all  Food Insecurity: Not on file  Transportation Needs: No Transportation Needs   Lack of Transportation (Medical): No   Lack of Transportation (Non-Medical): No  Physical Activity: Not on file  Stress: Not on file  Social Connections: Not on file    Family History  Problem Relation Age of Onset   Cancer Brother     Past Medical History:  Diagnosis Date   ALLERGIC RHINITIS 07/13/2007   GERD (gastroesophageal reflux disease)     HYPERLIPIDEMIA 07/13/2007   HYPERTENSION 07/13/2007   Impaired glucose tolerance 09/27/2016   Iron deficiency anemia due to chronic blood loss 02/27/2021   NEPHROLITHIASIS, 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   PEPTIC ULCER DISEASE 07/13/2007   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    Past Surgical History:  Procedure Laterality Date   BROW LIFT Bilateral 11/10/2020   Procedure: BLEPHAROPLASTY;  Surgeon: [name], MD;  Location: [facility];  Service: Plastics;  Laterality: Bilateral;  1 hour   COLONOSCOPY WITH PROPOFOL N/A 02/17/2019   Procedure: COLONOSCOPY WITH PROPOFOL;  Surgeon: Ronnette Juniper, MD;  Location: [facility];  Service: Gastroenterology;  Laterality: N/A;   CORONARY STENT INTERVENTION N/A 08/25/2021   Procedure: CORONARY STENT INTERVENTION;  Surgeon: Jettie Booze, MD;  Location:  [facility];  Service: Cardiovascular;  Laterality: N/A;   HYDROCELE EXCISION Right 05/15/2003   INTRAVASCULAR LITHOTRIPSY  08/25/2021   Procedure: INTRAVASCULAR LITHOTRIPSY;  Surgeon: Jettie Booze, MD;  Location: [facility];  Service: Cardiovascular;;   INTRAVASCULAR ULTRASOUND/IVUS N/A 08/25/2021   Procedure: Intravascular Ultrasound/IVUS;  Surgeon: Jettie Booze, MD;  Location: [facility];  Service: Cardiovascular;  Laterality: N/A;   IR ANGIOGRAM VISCERAL SELECTIVE  02/16/2019   IR ANGIOGRAM VISCERAL SELECTIVE  02/16/2019   IR ANGIOGRAM VISCERAL SELECTIVE  02/16/2019   IR US GUIDE VASC ACCESS RIGHT  02/16/2019   LEFT HEART CATH AND CORONARY ANGIOGRAPHY N/A 07/28/2021   Procedure: LEFT HEART CATH AND CORONARY ANGIOGRAPHY;  Surgeon: Jettie Booze, MD;  Location: [facility];  Service: Cardiovascular;  Laterality: N/A;   LEFT HEART CATH AND CORONARY ANGIOGRAPHY N/A 08/25/2021   Procedure: LEFT HEART CATH AND CORONARY ANGIOGRAPHY;  Surgeon: Jettie Booze, MD;  Location: [facility];  Service: Cardiovascular;  Laterality: N/A;    ROTATOR CUFF REPAIR     SPERMATOCELECTOMY Right 05/15/2003     Current Outpatient Medications on File Prior to Visit  Medication Sig Dispense Refill   acetaminophen (TYLENOL) 500 MG tablet Take 500 mg by mouth every 6 (six) hours as needed for headache.     Ascorbic Acid (VITAMIN C) 1000 MG tablet Take 1,000 mg by mouth 2 (two) times a week.     aspirin EC 81 MG tablet Take 81 mg by mouth at bedtime. Swallow whole.     cetirizine (ZYRTEC) 10 MG tablet Take 1 tablet (10 mg total) by mouth daily. 30 tablet 11   clopidogrel (PLAVIX) 75 MG tablet Take 1 tablet (75 mg total) by mouth daily. Take one tablet by mouth ( 75 mg) daily. 90 tablet 3   fluticasone (FLONASE) 50 MCG/ACT nasal spray Place 2 sprays into both nostrils daily. (Patient taking differently: Place 2 sprays into both nostrils 2 (two) times daily.) 16 g 6   latanoprost (XALATAN) 0.005 % ophthalmic solution Place 1 drop into both eyes at bedtime. 7.5 mL 1   losartan (COZAAR) 100 MG tablet Take 1 tablet by mouth once daily 90 tablet 0   MAGNESIUM PO Take 400 mg by mouth at bedtime.     metoprolol succinate (TOPROL XL) 25 MG 24 hr tablet Take 0.5 tablets (12.5 mg total) by mouth daily. 45 tablet 3   Multiple Vitamin (MULTIVITAMIN WITH MINERALS) TABS tablet Take 1 tablet by mouth daily.     naproxen (NAPROSYN) 500 MG tablet Take 500 mg by mouth as needed. For knee pain     nitroGLYCERIN (NITROSTAT) 0.4 MG SL tablet Place 1 tablet (0.4 mg total) under the tongue every 5 (five) minutes as needed for chest pain. 25 tablet 3   pantoprazole (PROTONIX) 40 MG tablet Take 1 tablet (40 mg total) by mouth daily. 30 tablet 6   PARoxetine (PAXIL) 10 MG tablet Take 20 mg by mouth daily.     rosuvastatin (CRESTOR) 20 MG tablet Take 1 tablet (20 mg total) by mouth daily. 30 tablet 11   tadalafil (CIALIS) 20 MG tablet Take 1 tablet (20 mg total) by mouth daily as needed for erectile dysfunction. 30 tablet 2   tamsulosin (FLOMAX) 0.4 MG CAPS capsule TAKE  1 CAPSULE BY MOUTH  DAILY 90 capsule 3   trolamine salicylate (ASPERCREME) 10 % cream Apply 1 application topically as needed for muscle pain.  No current facility-administered medications on file prior to visit.    Physical exam:  Today's Vitals   09/21/21 1231  BP: (!) 167/84  Pulse: 75  Weight: 179 lb 8 oz (81.4 kg)  Height: 5' 4.5" (1.638 m)   Body mass index is 30.34 kg/m.   Wt Readings from Last 3 Encounters:  09/21/21 179 lb 8 oz (81.4 kg)  09/08/21 179 lb 12.8 oz (81.6 kg)  08/25/21 176 lb (79.8 kg)     Ht Readings from Last 3 Encounters:  09/21/21 5' 4.5" (1.638 m)  09/08/21 5' 4.5" (1.638 m)  08/25/21 5' 4.5" (1.638 m)      General: The patient is awake, alert and appears not in acute distress. The patient is well groomed. Head: Normocephalic, atraumatic. Neck is supple. Mallampati: 2 plus, swollen,   neck circumference:17 inches . Nasal airflow congested - patent.  Retrognathia is not  seen.  Dental status:   Cardiovascular:  Regular rate and cardiac rhythm by pulse,  without distended neck veins. Respiratory: Lungs are clear to auscultation.  Skin:  Without evidence of ankle edema, or rash. Trunk: The patient's posture is erect.   Neurologic exam : The patient is awake and alert, oriented to place and time.   Memory subjective described as intact.  Attention span & concentration ability appears normal.  Speech is fluent,  without  dysarthria, dysphonia or aphasia.  Mood and affect are appropriate.   Cranial nerves: no loss of smell or taste reported  Pupils are equal and briskly reactive to light. Funduscopic exam status post cataract surgery on the  left eye. Contact lens in the right eye.  Extraocular movements in vertical and horizontal planes were intact and without nystagmus. No Diplopia. Visual fields by finger perimetry are intact. Hearing was impaired.   Facial sensation intact to fine touch.  Facial motor strength is symmetric and tongue and  uvula move midline.  Neck ROM : rotation, tilt and flexion extension were normal for age and shoulder shrug was symmetrical.    Motor exam:  Symmetric bulk, tone and ROM.   Normal tone without cog wheeling, symmetric grip strength .   Sensory:  Fine touch, pinprick and vibration were  normal.  Proprioception tested in the upper extremities was normal.   Coordination: Rapid alternating movements in the fingers/hands were of normal speed.  The Finger-to-nose maneuver was intact without evidence of ataxia, dysmetria or tremor.   Gait and station: Patient could rise unassisted from a seated position, walked without assistive device.  Stance is of normal width/ base and the patient turned with 3 steps.  Toe and heel walk were deferred.  Deep tendon reflexes: in the upper and lower extremities are trace. Babinski response was deferred.     After spending a total time of 45  minutes face to face and additional time for physical and neurologic examination, review of laboratory studies,  personal review of imaging studies, reports and results of other testing and review of referral information / records as far as provided in visit, I have established the following assessments:   0) snoring , but no apnea reported.  1) reports RLS, cramping, knee pain.  2) vivid dreams, causing non restorative sleep.  3) wife reports him yelling in his sleep.    My Plan is to proceed with:  1) Narcolepsy panel 2) Attended sleep study will address PLMs and REM BD, prefer over HST for sleep apnea screening,  3) history of hallucinations under opiate medication.  I would like to thank Haydee Salter, [address] [place],  [place] [id number] for allowing me to meet with and to take care of this pleasant patient.   In short, Clara Smolen is presenting with an interesting sleep history, needs screening for RLS, narcolepsy and apnea.  SPLIT study -if AHI under 10 stay for MSLT, Paxil  will be  weaned off in preparation-  10 mg from today on, than d/c in another 3 weeks.   I plan to follow up either personally or through our NP within 2-4 month.   CC: I will share my notes with PCP.   Electronically signed by: Larey Seat, MD 09/21/2021 12:47 PM  [facility] Board certified by The [employer] of Sleep Medicine and Diplomate of the [employer] of Sleep Medicine. Board certified In Neurology through the [place], Fellow of the [employer] of Neurology. Medical Director of [facility].

## 2021-09-24 ENCOUNTER — Institutional Professional Consult (permissible substitution): Payer: Medicare Other | Admitting: Neurology

## 2021-09-29 LAB — NARCOLEPSY EVALUATION
DQA1*01:02: NEGATIVE
DQB1*06:02: NEGATIVE

## 2021-09-29 NOTE — Progress Notes (Signed)
Negative HLA narcolepsy trait test-

## 2021-09-30 ENCOUNTER — Telehealth: Payer: Self-pay | Admitting: Neurology

## 2021-09-30 ENCOUNTER — Telehealth (HOSPITAL_COMMUNITY): Payer: Self-pay

## 2021-09-30 NOTE — Telephone Encounter (Signed)
-----  Message from Larey Seat, MD sent at 09/29/2021  4:53 PM EDT ----- Negative HLA narcolepsy trait test-

## 2021-09-30 NOTE — Telephone Encounter (Signed)
Pt called in and our phone staff was able to advise that the lab results came back negative for carrying the narcolepsy gene. Patient was advised that we will see what the sleep study shows. He verbalized understanding

## 2021-09-30 NOTE — Telephone Encounter (Signed)
Pt is not interested in the cardiac rehab program, Closed referral.

## 2021-10-12 ENCOUNTER — Other Ambulatory Visit: Payer: Self-pay | Admitting: *Deleted

## 2021-10-12 ENCOUNTER — Telehealth: Payer: Self-pay

## 2021-10-12 NOTE — Telephone Encounter (Signed)
Called pt. He is no longer taking paxil. He stopped this when he was in the hospital. Aware I will send message back to sleep lab to contact him to get scheduled. He will be out of town starting on 10/15/21 and wont be back until 11/12/21.

## 2021-10-12 NOTE — Telephone Encounter (Signed)
Spoke with patient to ask if he had begun the weaning process for his medication Paxil so that we can schedule his in lab sleep study - Pt was unsure what medication I was referring to. I let him know I was unable to give medication information and would reach out for a nurse to call him. Please call patient to clarify medication and weaning process.

## 2021-10-12 NOTE — Telephone Encounter (Signed)
Per the office note from 09/21/2021 pt was to decrease to 10 mg of paxil from that day on, than d/c in another 3 weeks.

## 2021-10-14 ENCOUNTER — Other Ambulatory Visit: Payer: Self-pay | Admitting: Family Medicine

## 2021-10-14 ENCOUNTER — Other Ambulatory Visit: Payer: Self-pay

## 2021-10-14 ENCOUNTER — Other Ambulatory Visit: Payer: Medicare Other | Admitting: *Deleted

## 2021-10-14 DIAGNOSIS — E871 Hypo-osmolality and hyponatremia: Secondary | ICD-10-CM | POA: Diagnosis not present

## 2021-10-14 DIAGNOSIS — I10 Essential (primary) hypertension: Secondary | ICD-10-CM

## 2021-10-14 LAB — BASIC METABOLIC PANEL
BUN/Creatinine Ratio: 24 (ref 10–24)
BUN: 25 mg/dL (ref 8–27)
CO2: 26 mmol/L (ref 20–29)
Calcium: 9.3 mg/dL (ref 8.6–10.2)
Chloride: 101 mmol/L (ref 96–106)
Creatinine, Ser: 1.05 mg/dL (ref 0.76–1.27)
Glucose: 91 mg/dL (ref 70–99)
Potassium: 4.9 mmol/L (ref 3.5–5.2)
Sodium: 136 mmol/L (ref 134–144)
eGFR: 72 mL/min/{1.73_m2} (ref >59)

## 2021-10-29 ENCOUNTER — Other Ambulatory Visit: Payer: Self-pay | Admitting: Internal Medicine

## 2021-11-24 ENCOUNTER — Ambulatory Visit (INDEPENDENT_AMBULATORY_CARE_PROVIDER_SITE_OTHER): Payer: Medicare Other | Admitting: Neurology

## 2021-11-24 ENCOUNTER — Other Ambulatory Visit: Payer: Self-pay

## 2021-11-24 DIAGNOSIS — G4731 Primary central sleep apnea: Secondary | ICD-10-CM

## 2021-11-24 DIAGNOSIS — I2510 Atherosclerotic heart disease of native coronary artery without angina pectoris: Secondary | ICD-10-CM

## 2021-11-24 DIAGNOSIS — G4733 Obstructive sleep apnea (adult) (pediatric): Secondary | ICD-10-CM

## 2021-11-24 DIAGNOSIS — R0683 Snoring: Secondary | ICD-10-CM

## 2021-11-24 DIAGNOSIS — R6889 Other general symptoms and signs: Secondary | ICD-10-CM

## 2021-11-24 DIAGNOSIS — G4719 Other hypersomnia: Secondary | ICD-10-CM

## 2021-11-24 DIAGNOSIS — R0602 Shortness of breath: Secondary | ICD-10-CM

## 2021-11-26 ENCOUNTER — Other Ambulatory Visit: Payer: Self-pay

## 2021-11-26 ENCOUNTER — Ambulatory Visit
Admission: EM | Admit: 2021-11-26 | Discharge: 2021-11-26 | Disposition: A | Discharge status: Home / Self Care | Payer: Medicare Other | Attending: Emergency Medicine | Admitting: Emergency Medicine

## 2021-11-26 DIAGNOSIS — I2510 Atherosclerotic heart disease of native coronary artery without angina pectoris: Secondary | ICD-10-CM

## 2021-11-26 DIAGNOSIS — J329 Chronic sinusitis, unspecified: Secondary | ICD-10-CM

## 2021-11-26 DIAGNOSIS — R059 Cough, unspecified: Secondary | ICD-10-CM

## 2021-11-26 MED ORDER — IPRATROPIUM BROMIDE 0.06 % NA SOLN: 2.0000 | Freq: Four times a day (QID) | NASAL | 0 refills | Status: DC

## 2021-11-26 MED ORDER — PROMETHAZINE-DM 6.25-15 MG/5ML PO SYRP: 5.0000 mL | ORAL_SOLUTION | Freq: Four times a day (QID) | ORAL | 0 refills | Status: DC | PRN

## 2021-11-26 NOTE — ED Provider Notes (Addendum)
[facility]    CSN: [id number] Arrival date & time: 11/26/21  1009    HISTORY  No chief complaint on file.  HPI David Goodman is a 80 y.o. male. Pt c/o feeling sick 2 days ago. He reports having a negative covid test this morning, he reports having a cough, headache and stomach pain.  Patient states the cough is not productive, is worse at night.  Patient states that he has had a lot of runny nose, has had some congestion worse at night and has postnasal drip which is causes a tickle in the back of his throat, making him cough.  Patient states he currently uses Flonase twice a day and is also taking Zyrtec.  EKG performed today redemonstrated known PVCs, was otherwise normal.  The history is provided by the patient.  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   Patient Active Problem List   Diagnosis Date Noted   Vivid dream 09/21/2021   Excessive daytime sleepiness 09/21/2021   Snoring 09/21/2021   Coronary artery disease involving native coronary artery of native heart 09/21/2021   SOBOE (shortness of breath on exertion) 09/21/2021   Coronary artery disease    Chronic systolic heart failure ([place])    Bell's palsy, left 06/05/2021   Cerebrovascular disease 06/05/2021   Hiatal hernia 05/29/2021   Depression, major, single episode, mild ([place]) 05/29/2021   Glaucoma 02/27/2021   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   Erectile dysfunction due to arterial insufficiency 02/27/2021   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 12/29/2020   Cough 12/17/2020   Hemorrhoids 07/17/2020   RLS (restless legs  syndrome) 10/17/2019   Syncope 11/15/2018   Diverticulitis 05/25/2018   Osteoarthritis of right knee 12/13/2017   Osteoarthritis of left knee 12/13/2017   Testosterone deficiency 03/22/2017   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/27/2016   GERD (gastroesophageal reflux disease) 09/27/2016   BPH (benign prostatic hyperplasia) 06/18/2013   Dyslipidemia 07/13/2007   Essential hypertension 07/13/2007   Allergic rhinitis 07/13/2007   Peptic ulcer disease 07/13/2007   History of nephrolithiasis 07/13/2007   Past Surgical History:  Procedure Laterality Date   BROW LIFT Bilateral 11/10/2020   Procedure: BLEPHAROPLASTY;  Surgeon: [name], MD;  Location: [facility];  Service: Plastics;  Laterality: Bilateral;  1 hour   COLONOSCOPY WITH PROPOFOL N/A 02/17/2019   Procedure: COLONOSCOPY WITH PROPOFOL;  Surgeon: Ronnette Juniper, MD;  Location: [facility];  Service: Gastroenterology;  Laterality: N/A;   CORONARY STENT INTERVENTION N/A 08/25/2021   Procedure: CORONARY STENT INTERVENTION;  Surgeon: Jettie Booze, MD;  Location: [facility];  Service: Cardiovascular;  Laterality: N/A;   HYDROCELE EXCISION Right 05/15/2003   INTRAVASCULAR LITHOTRIPSY  08/25/2021   Procedure: INTRAVASCULAR LITHOTRIPSY;  Surgeon: Jettie Booze, MD;  Location: [facility];  Service: Cardiovascular;;   INTRAVASCULAR ULTRASOUND/IVUS N/A 08/25/2021   Procedure: Intravascular Ultrasound/IVUS;  Surgeon: Jettie Booze, MD;  Location: [facility];  Service: Cardiovascular;  Laterality: N/A;   IR ANGIOGRAM VISCERAL SELECTIVE  02/16/2019   IR ANGIOGRAM VISCERAL SELECTIVE  02/16/2019   IR ANGIOGRAM VISCERAL SELECTIVE  02/16/2019   IR US GUIDE  VASC ACCESS RIGHT  02/16/2019   LEFT HEART CATH AND CORONARY ANGIOGRAPHY N/A 07/28/2021   Procedure: LEFT HEART CATH AND CORONARY ANGIOGRAPHY;  Surgeon: Jettie Booze, MD;  Location: [facility];  Service: Cardiovascular;   Laterality: N/A;   LEFT HEART CATH AND CORONARY ANGIOGRAPHY N/A 08/25/2021   Procedure: LEFT HEART CATH AND CORONARY ANGIOGRAPHY;  Surgeon: Jettie Booze, MD;  Location: [facility];  Service: Cardiovascular;  Laterality: N/A;   ROTATOR CUFF REPAIR     SPERMATOCELECTOMY Right 05/15/2003    Home Medications    Prior to Admission medications   Medication Sig Start Date End Date Taking? Authorizing Provider  acetaminophen (TYLENOL) 500 MG tablet Take 500 mg by mouth every 6 (six) hours as needed for headache.    [provider]  Ascorbic Acid (VITAMIN C) 1000 MG tablet Take 1,000 mg by mouth 2 (two) times a week.    [provider]  aspirin EC 81 MG tablet Take 81 mg by mouth at bedtime. Swallow whole.    [provider]  cetirizine (ZYRTEC) 10 MG tablet Take 1 tablet (10 mg total) by mouth daily. 07/10/21   Haydee Salter, MD  clopidogrel (PLAVIX) 75 MG tablet Take 1 tablet (75 mg total) by mouth daily. Take one tablet by mouth ( 75 mg) daily. 09/08/21 09/08/22  Richardson Dopp T, PA-C  fluticasone (FLONASE) 50 MCG/ACT nasal spray Place 2 sprays into both nostrils daily. Patient taking differently: Place 2 sprays into both nostrils 2 (two) times daily. 07/09/20   Isaac Bliss, Rayford Halsted, MD  latanoprost (XALATAN) 0.005 % ophthalmic solution Place 1 drop into both eyes at bedtime. 06/20/17   Marletta Lor, MD  losartan (COZAAR) 100 MG tablet Take 1 tablet by mouth once daily 10/14/21   Haydee Salter, MD  MAGNESIUM PO Take 400 mg by mouth at bedtime.    [provider]  metoprolol succinate (TOPROL XL) 25 MG 24 hr tablet Take 0.5 tablets (12.5 mg total) by mouth daily. 09/08/21 09/08/22  Richardson Dopp T, PA-C  Multiple Vitamin (MULTIVITAMIN WITH MINERALS) TABS tablet Take 1 tablet by mouth daily.    [provider]  naproxen (NAPROSYN) 500 MG tablet TAKE 1 TABLET BY MOUTH  TWICE DAILY WITH MEALS 10/30/21   Isaac Bliss, Rayford Halsted,  MD  nitroGLYCERIN (NITROSTAT) 0.4 MG SL tablet Place 1 tablet (0.4 mg total) under the tongue every 5 (five) minutes as needed for chest pain. 07/22/21   Jettie Booze, MD  pantoprazole (PROTONIX) 40 MG tablet Take 1 tablet (40 mg total) by mouth daily. 08/13/21   Jettie Booze, MD  rosuvastatin (CRESTOR) 20 MG tablet Take 1 tablet (20 mg total) by mouth daily. 08/25/21 08/25/22  Cheryln Manly, NP  tadalafil (CIALIS) 20 MG tablet Take 1 tablet (20 mg total) by mouth daily as needed for erectile dysfunction. 03/06/20   Isaac Bliss, Rayford Halsted, MD  tamsulosin (FLOMAX) 0.4 MG CAPS capsule TAKE 1 CAPSULE BY MOUTH  DAILY 10/30/21   Isaac Bliss, Rayford Halsted, MD  trolamine salicylate (ASPERCREME) 10 % cream Apply 1 application topically as needed for muscle pain.    [provider]   Family History Family History  Problem Relation Age of Onset   Cancer Brother    Social History Social History   Tobacco Use   Smoking status: Never   Smokeless tobacco: Never  Vaping Use   Vaping Use: Never used  Substance Use Topics  Alcohol use: Yes    Comment: couple times a week - wine   Drug use: No   Allergies   Patient has no known allergies.  Review of Systems Review of Systems Pertinent findings noted in history of present illness.   Physical Exam Triage Vital Signs ED Triage Vitals  Enc Vitals Group     BP 09/25/21 0827 (!) 147/82     Pulse Rate 09/25/21 0827 72     Resp 09/25/21 0827 18     Temp 09/25/21 0827 98.3 F (36.8 C)     Temp Source 09/25/21 0827 Oral     SpO2 09/25/21 0827 98 %     Weight --      Height --      Head Circumference --      Peak Flow --      Pain Score 09/25/21 0826 5     Pain Loc --      Pain Edu? --      Excl. in [place]? --   No data found.  Updated Vital Signs BP (!) 150/89 (BP Location: Left Arm)    Pulse 70    Temp 98.1 F (36.7 C) (Oral)    Resp 18    SpO2 96%   Physical Exam Vitals and nursing note reviewed.   Constitutional:      General: He is not in acute distress.    Appearance: Normal appearance. He is not ill-appearing.  HENT:     Head: Normocephalic and atraumatic.     Salivary Glands: Right salivary gland is not diffusely enlarged or tender. Left salivary gland is not diffusely enlarged or tender.     Right Ear: Ear canal and external ear normal. No drainage. A middle ear effusion is present. There is no impacted cerumen. Tympanic membrane is bulging. Tympanic membrane is not injected or erythematous.     Left Ear: Ear canal and external ear normal. No drainage. A middle ear effusion is present. There is no impacted cerumen. Tympanic membrane is bulging. Tympanic membrane is not injected or erythematous.     Ears:     Comments: Bilateral EACs normal, both TMs bulging with clear fluid    Nose: Rhinorrhea present. No nasal deformity, septal deviation, signs of injury, nasal tenderness, mucosal edema or congestion. Rhinorrhea is clear.     Right Nostril: Occlusion present. No foreign body, epistaxis or septal hematoma.     Left Nostril: Occlusion present. No foreign body, epistaxis or septal hematoma.     Right Turbinates: Swollen. Not enlarged or pale.     Left Turbinates: Swollen. Not enlarged or pale.     Right Sinus: No maxillary sinus tenderness or frontal sinus tenderness.     Left Sinus: No maxillary sinus tenderness or frontal sinus tenderness.     Mouth/Throat:     Lips: Pink. No lesions.     Mouth: Mucous membranes are moist. No oral lesions.     Pharynx: Oropharynx is clear. Uvula midline. No posterior oropharyngeal erythema or uvula swelling.     Tonsils: No tonsillar exudate. 0 on the right. 0 on the left.     Comments: Postnasal drip postnasal drip Eyes:     General: Lids are normal.        Right eye: No discharge.        Left eye: No discharge.     Extraocular Movements: Extraocular movements intact.     Conjunctiva/sclera: Conjunctivae normal.     Right eye: Right  conjunctiva  is not injected.     Left eye: Left conjunctiva is not injected.  Neck:     Trachea: Trachea and phonation normal.  Cardiovascular:     Rate and Rhythm: Normal rate and regular rhythm.     Pulses: Normal pulses.     Heart sounds: Normal heart sounds. No murmur heard.   No friction rub. No gallop.  Pulmonary:     Effort: Pulmonary effort is normal. No accessory muscle usage, prolonged expiration or respiratory distress.     Breath sounds: Normal breath sounds. No stridor, decreased air movement or transmitted upper airway sounds. No decreased breath sounds, wheezing, rhonchi or rales.  Chest:     Chest wall: No tenderness.  Musculoskeletal:        General: Normal range of motion.     Cervical back: Normal range of motion and neck supple. Normal range of motion.  Lymphadenopathy:     Cervical: No cervical adenopathy.  Skin:    General: Skin is warm and dry.     Findings: No erythema or rash.  Neurological:     General: No focal deficit present.     Mental Status: He is alert and oriented to person, place, and time.  Psychiatric:        Mood and Affect: Mood normal.        Behavior: Behavior normal.    Visual Acuity Right Eye Distance:   Left Eye Distance:   Bilateral Distance:    Right Eye Near:   Left Eye Near:    Bilateral Near:     UC Couse / Diagnostics / Procedures:    EKG  Radiology No results found.  Procedures Procedures (including critical care time)  UC Diagnoses / Final Clinical Impressions(s)   I have reviewed the triage vital signs and the nursing notes.  Pertinent labs & imaging results that were available during my care of the patient were reviewed by me and considered in my medical decision making (see chart for details).   Final diagnoses:  Coronary artery disease involving native coronary artery of native heart, unspecified whether angina present  Cough, unspecified type  Rhinosinusitis   Irritative cough secondary to rhinitis and  postnasal drip.  Begin Atrovent in addition to Flonase.  Patient provided with promethazine cough syrup for nighttime cough.  ED Prescriptions     Medication Sig Dispense Auth. Provider   ipratropium (ATROVENT) 0.06 % nasal spray Place 2 sprays into both nostrils 4 (four) times daily. As needed for nasal congestion, runny nose 15 mL Lynden Oxford Scales, PA-C   promethazine-dextromethorphan (PROMETHAZINE-DM) 6.25-15 MG/5ML syrup Take 5 mLs by mouth 4 (four) times daily as needed for cough. 118 mL Lynden Oxford Scales, PA-C      PDMP not reviewed this encounter.  Pending results:  Labs Reviewed - No data to display  Medications Ordered in UC: Medications - No data to display  Disposition Upon Discharge:  Condition: stable for discharge home Home: take medications as prescribed; routine discharge instructions as discussed; follow up as advised.  Patient presented with an acute illness with associated systemic symptoms and significant discomfort requiring urgent management. In my opinion, this is a condition that a prudent lay person (someone who possesses an average knowledge of health and medicine) may potentially expect to result in complications if not addressed urgently such as respiratory distress, impairment of bodily function or dysfunction of bodily organs.   Routine symptom specific, illness specific and/or disease specific instructions were discussed with the patient  and/or caregiver at length.   As such, the patient has been evaluated and assessed, work-up was performed and treatment was provided in alignment with urgent care protocols and evidence based medicine.  Patient/parent/caregiver has been advised that the patient may require follow up for further testing and treatment if the symptoms continue in spite of treatment, as clinically indicated and appropriate.  If the patient was tested for COVID-19, Influenza and/or RSV, then the patient/parent/guardian was advised to  isolate at home pending the results of his/her diagnostic coronavirus test and potentially longer if theyre positive. I have also advised pt that if his/her COVID-19 test returns positive, it's recommended to self-isolate for at least 10 days after symptoms first appeared AND until fever-free for 24 hours without fever reducer AND other symptoms have improved or resolved. Discussed self-isolation recommendations as well as instructions for household member/close contacts as per the [facility] and [place] [facility], and also gave patient the [place] packet with this information.  Patient/parent/caregiver has been advised to return to the [facility] or PCP in 3-5 days if no better; to PCP or the Emergency Department if new signs and symptoms develop, or if the current signs or symptoms continue to change or worsen for further workup, evaluation and treatment as clinically indicated and appropriate  The patient will follow up with their current PCP if and as advised. If the patient does not currently have a PCP we will assist them in obtaining one.   The patient may need specialty follow up if the symptoms continue, in spite of conservative treatment and management, for further workup, evaluation, consultation and treatment as clinically indicated and appropriate.  Patient/parent/caregiver verbalized understanding and agreement of plan as discussed.  All questions were addressed during visit.  Please see discharge instructions below for further details of plan.  Discharge Instructions:   Discharge Instructions      The cough you are experiencing at this time is being caused by postnasal drip tickling your throat and triggering you to cough.  This is easily fixed with an additional nasal spray that you can use 4 times daily.  Please use your Flonase in the morning and evening as normal, and add 2 more sprays of Atrovent into each nare.  Please use Atrovent 2 more times throughout the day perhaps late morning and early  afternoon.  For nighttime cough, while the Atrovent is starting to help dry mucous membranes, please feel free to take Promethazine DM, 5 mL, a few minutes before you plan to go to bed.  Thank you for visiting urgent care today.  Please let us know if you have not had improvement of your symptoms by middle of next week.      This office note has been dictated using Museum/gallery curator.  Unfortunately, and despite my best efforts, this method of dictation can sometimes lead to occasional typographical or grammatical errors.  I apologize in advance if this occurs.     Lynden Oxford Scales, PA-C 11/26/21 1145    Lynden Oxford [place], [place] 11/26/21 1146

## 2021-11-26 NOTE — Discharge Instructions (Signed)
The cough you are experiencing at this time is being caused by postnasal drip tickling your throat and triggering you to cough.  This is easily fixed with an additional nasal spray that you can use 4 times daily.  Please use your Flonase in the morning and evening as normal, and add 2 more sprays of Atrovent into each nare.  Please use Atrovent 2 more times throughout the day perhaps late morning and early afternoon.  For nighttime cough, while the Atrovent is starting to help dry mucous membranes, please feel free to take Promethazine DM, 5 mL, a few minutes before you plan to go to bed.  Thank you for visiting urgent care today.  Please let us know if you have not had improvement of your symptoms by middle of next week.

## 2021-11-26 NOTE — ED Triage Notes (Signed)
Pt c/o feeling sick 2 days ago. He reports having a negative covid test this morning, he reports having a cough, headache and stomach pain.

## 2021-11-26 NOTE — ED Notes (Signed)
EKG completed per providers orders and given to provider.

## 2021-11-27 ENCOUNTER — Ambulatory Visit: Payer: Medicare Other | Admitting: Nurse Practitioner

## 2021-12-09 ENCOUNTER — Other Ambulatory Visit: Payer: Self-pay

## 2021-12-09 ENCOUNTER — Ambulatory Visit (HOSPITAL_COMMUNITY): Payer: Medicare Other | Attending: Cardiology

## 2021-12-09 ENCOUNTER — Other Ambulatory Visit: Payer: Medicare Other | Admitting: *Deleted

## 2021-12-09 ENCOUNTER — Telehealth: Payer: Self-pay

## 2021-12-09 ENCOUNTER — Encounter: Payer: Self-pay | Admitting: Physician Assistant

## 2021-12-09 DIAGNOSIS — I5020 Unspecified systolic (congestive) heart failure: Secondary | ICD-10-CM

## 2021-12-09 DIAGNOSIS — I2510 Atherosclerotic heart disease of native coronary artery without angina pectoris: Secondary | ICD-10-CM | POA: Insufficient documentation

## 2021-12-09 DIAGNOSIS — I493 Ventricular premature depolarization: Secondary | ICD-10-CM | POA: Insufficient documentation

## 2021-12-09 DIAGNOSIS — I77810 Thoracic aortic ectasia: Secondary | ICD-10-CM

## 2021-12-09 DIAGNOSIS — E7800 Pure hypercholesterolemia, unspecified: Secondary | ICD-10-CM

## 2021-12-09 DIAGNOSIS — I10 Essential (primary) hypertension: Secondary | ICD-10-CM

## 2021-12-09 DIAGNOSIS — M170 Bilateral primary osteoarthritis of knee: Secondary | ICD-10-CM | POA: Diagnosis not present

## 2021-12-09 LAB — LIPID PANEL
Chol/HDL Ratio: 2.7 ratio (ref 0.0–5.0)
Cholesterol, Total: 114 mg/dL (ref 100–199)
HDL: 42 mg/dL (ref >39)
LDL Chol Calc (NIH): 41 mg/dL (ref 0–99)
Triglycerides: 189 mg/dL — ABNORMAL HIGH (ref 0–149)
VLDL Cholesterol Cal: 31 mg/dL (ref 5–40)

## 2021-12-09 LAB — ECHOCARDIOGRAM LIMITED
Area-P 1/2: 3.85 cm2
MV M vel: 5.57 m/s
MV Peak grad: 124.1 mmHg
P 1/2 time: 388 msec
S' Lateral: 4.7 cm

## 2021-12-09 LAB — COMPREHENSIVE METABOLIC PANEL
ALT: 18 IU/L (ref 0–44)
AST: 23 IU/L (ref 0–40)
Albumin/Globulin Ratio: 2.2 (ref 1.2–2.2)
Albumin: 4.4 g/dL (ref 3.7–4.7)
Alkaline Phosphatase: 83 IU/L (ref 44–121)
BUN/Creatinine Ratio: 16 (ref 10–24)
BUN: 18 mg/dL (ref 8–27)
Bilirubin Total: 0.4 mg/dL (ref 0.0–1.2)
CO2: 25 mmol/L (ref 20–29)
Calcium: 9.3 mg/dL (ref 8.6–10.2)
Chloride: 99 mmol/L (ref 96–106)
Creatinine, Ser: 1.16 mg/dL (ref 0.76–1.27)
Globulin, Total: 2 g/dL (ref 1.5–4.5)
Glucose: 89 mg/dL (ref 70–99)
Potassium: 4.7 mmol/L (ref 3.5–5.2)
Sodium: 136 mmol/L (ref 134–144)
Total Protein: 6.4 g/dL (ref 6.0–8.5)
eGFR: 64 mL/min/{1.73_m2} (ref >59)

## 2021-12-09 MED ORDER — ENTRESTO 24-26 MG PO TABS: 1.0000 | ORAL_TABLET | Freq: Two times a day (BID) | ORAL | 11 refills | Status: DC

## 2021-12-09 NOTE — Telephone Encounter (Signed)
-----   Message from Liliane Shi, [place] sent at 12/09/2021  4:56 PM EST ----- EF has not improved since PCI.  It remains ~ 40%.  Mitral regurgitation is also stable.  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  His BP should be able to tolerate changing Losartan to Entresto. PLAN:  - DC Losartan - Entresto 24/26 mg twice daily  - BMET at f/u with Dr. Irish Lack later this month. - Arrange Chest/Aorta CTA to size aorta better - Keep f/u with Dr. Irish Lack later this month to discuss other med adjustments for HF Richardson Dopp, PA-C    12/09/2021 4:26 PM

## 2021-12-09 NOTE — Telephone Encounter (Signed)
Placed orders for medication changes and CT. Sent mychart message on medication changes.

## 2021-12-10 ENCOUNTER — Other Ambulatory Visit: Payer: Self-pay | Admitting: Family Medicine

## 2021-12-10 ENCOUNTER — Telehealth: Payer: Self-pay | Admitting: Family Medicine

## 2021-12-10 DIAGNOSIS — J301 Allergic rhinitis due to pollen: Secondary | ICD-10-CM

## 2021-12-10 DIAGNOSIS — F320 Major depressive disorder, single episode, mild: Secondary | ICD-10-CM

## 2021-12-10 MED ORDER — FLUTICASONE PROPIONATE 50 MCG/ACT NA SUSP: 2.0000 | Freq: Two times a day (BID) | NASAL | 6 refills | Status: DC

## 2021-12-10 NOTE — Telephone Encounter (Signed)
Pt requesting refill on  flonase

## 2021-12-10 NOTE — Telephone Encounter (Signed)
Refill request for: Flonase  LR 07/09/20, 6 rf (Dr Alden Hipp) [place] 07/10/21 FOV none scheduled.   Please review and advise.  Thanks. Dm/cma

## 2021-12-10 NOTE — Telephone Encounter (Signed)
Refill request for: Paxil 10 mg LR  - [facility] 07/10/21 FOV  none scheduled.   Please review and advise.  Thanks. Dm/cma

## 2021-12-10 NOTE — Telephone Encounter (Signed)
Patient notified VIA phone. Dm/cma  

## 2021-12-11 ENCOUNTER — Telehealth: Payer: Self-pay

## 2021-12-11 ENCOUNTER — Other Ambulatory Visit: Payer: Self-pay | Admitting: Family Medicine

## 2021-12-11 DIAGNOSIS — J301 Allergic rhinitis due to pollen: Secondary | ICD-10-CM

## 2021-12-11 MED ORDER — FLUTICASONE PROPIONATE 50 MCG/ACT NA SUSP: 1.0000 | Freq: Two times a day (BID) | NASAL | 6 refills | Status: DC

## 2021-12-11 NOTE — Progress Notes (Signed)
Chronic Care Management Pharmacy Assistant   Name: David Goodman  MRN: [id number] DOB: 01/14/1941  Chart Review for the clinical pharmacist on 12/17/2021 at 1:00 pm.   Conditions to be addressed/monitored: CAD, HTN, HLD, Depression, GERD, Allergic Rhinitis, Osteoarthritis, and Hemorrhoids, Left Ventricular dysfunction, Cerebrovascular disease, HFrEF,Bell's Palsy, BPH   Primary concerns for visit include: Overall health, heart Condition.   Recent office visits:  07/10/2021 Dr. Gena Fray MD (PCP) start cetirizine 10 mg daily, Return in about 3 months   Recent consult visits:  12/09/2021 Richardson Dopp PA-C (Cardiology) stop Losartan, start Entresto 24-26 mg 1 tablet 2 times daily 09/21/2021 Dr. Brett Fairy MD (Neurology) No medication changes noted 09/08/2021 Richardson Dopp PA_C (Cardiology) Start Metoprolol 12.5 mg daily, decrease clopidogreal bisulfate to 75 mg daily, Return in about 3 months  07/22/2021 Drue Novel RN (Cardiology) start Nitroglycerin 0.4 mg PRN 07/08/2021 Dr. Irish Lack MD (Cardiology)  Change Metoprolol to 50 mg one tablet 2 times daily 06/25/2021 Dr. Irish Lack MD (Cardiology) No Medication Changes noted  Hospital visits:  Medication Reconciliation was completed by comparing discharge summary, patients EMR and Pharmacy list, and upon discussion with patient.  Admitted to the hospital on 11/26/2021 due to Rhinosinusitis. Discharge date was 11/26/2021. Discharged from [facility] Urgent [place]?Medications Started at [facility] Discharge:?? -started ipratropium 0.6% - started promethazine-dextromethorphan 5 ml   Medication Changes at Hospital Discharge: -Changed None  Medications Discontinued at Hospital Discharge: -Stopped None  Medications that remain the same after Hospital Discharge:??  -All other medications will remain the same.   Admitted to the hospital on 08/25/2021 due to Coronary artery disease. Discharge date was 08/25/2021. Discharged from  [place]?Medications Started at [facility] Discharge:?? -started None  Medication Changes at Hospital Discharge: -Changed None  Medications Discontinued at Hospital Discharge: -Stopped None  Medications that remain the same after Hospital Discharge:??  -All other medications will remain the same.   Admitted to the hospital on 07/28/2021 due to Chronic Systolic Heart Failure. Discharge date was 07/28/2021. Discharged from [place]?Medications Started at [facility] Discharge:?? -started none  Medication Changes at Hospital Discharge: -Changed none  Medications Discontinued at Hospital Discharge: -Stopped none  Medications that remain the same after Hospital Discharge:??  -All other medications will remain the same.    Have you seen any other providers since your last visit?   Patient denies seeing any other providers since his last visit. Any changes in your medications or health?   Patient denies any changes in his medication or health. Any side effects from any medications?   Patient denies any side effects from his current medications. Do you have an symptoms or problems not managed by your medications?   Patient denies any problems or issue at this time. Any concerns about your health right now?   Patient states he is concern about his heart condition.  Has your provider asked that you check blood pressure, blood sugar, or follow special diet at home?   Patient reports checking his blood pressure and blood sugar at home. Do you get any type of exercise on a regular basis?   None ID Can you think of a goal you would like to reach for your health?   Patient will inform Clinical pharmacist.  Do you have any problems getting your medications?   Patient denies any issues getting his medications. Is there anything that you would like to discuss during the appointment?  Overall health, heart Condition.  Please bring  medications and supplements to appointment  Patient is aware to have all medications and supplements at his appointment.  Medications: Outpatient Encounter Medications as of 12/11/2021  Medication Sig Note   acetaminophen (TYLENOL) 500 MG tablet Take 500 mg by mouth every 6 (six) hours as needed for headache.    Ascorbic Acid (VITAMIN C) 1000 MG tablet Take 1,000 mg by mouth 2 (two) times a week. 07/24/2021: On hold   aspirin EC 81 MG tablet Take 81 mg by mouth at bedtime. Swallow whole.    cetirizine (ZYRTEC) 10 MG tablet Take 1 tablet (10 mg total) by mouth daily.    clopidogrel (PLAVIX) 75 MG tablet Take 1 tablet (75 mg total) by mouth daily. Take one tablet by mouth ( 75 mg) daily.    fluticasone (FLONASE) 50 MCG/ACT nasal spray Place 2 sprays into both nostrils 2 (two) times daily.    ipratropium (ATROVENT) 0.06 % nasal spray Place 2 sprays into both nostrils 4 (four) times daily. As needed for nasal congestion, runny nose    latanoprost (XALATAN) 0.005 % ophthalmic solution Place 1 drop into both eyes at bedtime.    MAGNESIUM PO Take 400 mg by mouth at bedtime.    metoprolol succinate (TOPROL XL) 25 MG 24 hr tablet Take 0.5 tablets (12.5 mg total) by mouth daily.    Multiple Vitamin (MULTIVITAMIN WITH MINERALS) TABS tablet Take 1 tablet by mouth daily. 07/24/2021: On hold   naproxen (NAPROSYN) 500 MG tablet TAKE 1 TABLET BY MOUTH  TWICE DAILY WITH MEALS    nitroGLYCERIN (NITROSTAT) 0.4 MG SL tablet Place 1 tablet (0.4 mg total) under the tongue every 5 (five) minutes as needed for chest pain.    pantoprazole (PROTONIX) 40 MG tablet Take 1 tablet (40 mg total) by mouth daily.    PARoxetine (PAXIL) 20 MG tablet Take 1 tablet (20 mg total) by mouth daily.    promethazine-dextromethorphan (PROMETHAZINE-DM) 6.25-15 MG/5ML syrup Take 5 mLs by mouth 4 (four) times daily as needed for cough.    rosuvastatin (CRESTOR) 20 MG tablet Take 1 tablet (20 mg total) by mouth daily.    sacubitril-valsartan  (ENTRESTO) 24-26 MG Take 1 tablet by mouth 2 (two) times daily.    tadalafil (CIALIS) 20 MG tablet Take 1 tablet (20 mg total) by mouth daily as needed for erectile dysfunction.    tamsulosin (FLOMAX) 0.4 MG CAPS capsule TAKE 1 CAPSULE BY MOUTH  DAILY    trolamine salicylate (ASPERCREME) 10 % cream Apply 1 application topically as needed for muscle pain.    No facility-administered encounter medications on file as of 12/11/2021.    Care Gaps: Shingrix Vaccine COVID-19 Vaccine Influenza Vaccine  Star Rating Drugs: Entresto 24-26 mg last filled 12/10/2021 30 day supply at [facility].  Rosuvastatin 20 mg last filled 10/14/2021 90 day supply at [facility].  Medication Fill Gaps: None ID  Anderson Malta Clinical Pharmacist Assistant [phone]

## 2021-12-11 NOTE — Progress Notes (Signed)
Corrected dosing of Flonase. He was exceeding the maximum dose.   1. Seasonal allergic rhinitis due to pollen  - fluticasone (FLONASE) 50 MCG/ACT nasal spray; Place 1 spray into both nostrils 2 (two) times daily.  Dispense: 15.8 mL; Refill: 6

## 2021-12-13 DIAGNOSIS — G4731 Primary central sleep apnea: Secondary | ICD-10-CM | POA: Insufficient documentation

## 2021-12-13 DIAGNOSIS — G4733 Obstructive sleep apnea (adult) (pediatric): Secondary | ICD-10-CM | POA: Insufficient documentation

## 2021-12-13 MED ORDER — ALPRAZOLAM 0.25 MG PO TABS: ORAL_TABLET | ORAL | 0 refills | Status: DC

## 2021-12-13 NOTE — Procedures (Signed)
PATIENT'S NAME:  David Goodman, David Goodman DOB:      16-Feb-1941      MR#:    [id number]     DATE OF RECORDING: 11/24/2021 REFERRING M.D.:  Arlester Marker, MD Study Performed:   Baseline Polysomnogram HISTORY: 09-21-2021: Mr. 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 The patient reported Vivid dreams and excessive daytime sleepiness, as well as RLS. Jaleil Renwick is presenting with an interesting sleep history, needs screening for RLS, narcolepsy and apnea. He weaned off Paxil in order to have this sleep study followed by an MSLT if no apnea is found.   The patient endorsed the Epworth Sleepiness Scale at 10/24 points.   The patient's weight 179 pounds with a height of 64 (inches), resulting in a BMI of 30.1 kg/m2. The patient's neck circumference measured 17 inches.  CURRENT MEDICATIONS: Tylenol, Vitamin C, Aspirin, Zyrtec, PAXIL Flonase, Xalatan, Cozaar, Magnesium, Toprol XL, Multivitamin, Naprosyn, Nitrostat, Protonix, Crestor, Cialis, Flomax, Aspercreme   PROCEDURE:  This is a multichannel digital polysomnogram utilizing the Somnostar 11.2 system.  Electrodes and sensors were applied and monitored per AASM Specifications.   EEG, EOG, Chin and Limb EMG, were sampled at 200 Hz.  ECG, Snore and Nasal Pressure, Thermal Airflow, Respiratory Effort, CPAP Flow and Pressure, Oximetry was sampled at 50 Hz. Digital video and audio were recorded.      BASELINE STUDY: Lights Out was at 20:28 and Lights On at 05:08.  Total recording time (TRT) was 520 minutes, with a total sleep time (TST) of  409 minutes.  The patient's sleep latency was 9.5 minutes.  REM latency was 217 minutes.  The sleep efficiency was 78.7 %.     SLEEP ARCHITECTURE: WASO (Wake after sleep onset) was 105 minutes.  There were 93 minutes in Stage N1, 258 minutes Stage N2, 0 minutes Stage N3 and 58 minutes in Stage REM.  The percentage of Stage N1 was 22.7%, Stage N2 was 63.1%, Stage N3 was 0% and Stage R (REM sleep) was 14.2%.   RESPIRATORY ANALYSIS:  There were a total of 175 respiratory events:  28 obstructive apneas, 13 central apneas and 0 mixed apneas with a total of 41 apneas and an apnea index (AI) of 6. /hour. There were 134 hypopneas with a hypopnea index of 19.7 /hour.      The total APNEA/HYPOPNEA INDEX (AHI) was 25.7/hour. 43 events occurred in REM sleep and 216 events in NREM. The REM AHI was 44.5 /hour, versus a non-REM AHI of 22.6. The patient spent 13 minutes of total sleep time in the supine position and 396 minutes in non-supine. All REM sleep was in supine position. The supine AHI was 13.8/h versus a non-supine AHI of 26.1.  OXYGEN SATURATION & C02:  The Wake baseline 02 saturation was 92%, with the lowest being 83%. Time spent below 89% saturation equaled 10 minutes.  The arousals were noted as: 210 were spontaneous, 0 were associated with PLMs, 62 were associated with respiratory events.   PERIODIC LIMB MOVEMENTS:  The patient had a total of 0 Periodic Limb Movements.    The patient experienced highly fragmented sleep,  Audio and video analysis did not show any abnormal or unusual movements, behaviors,  phonations or vocalizations.   Snoring was noted. EKG was in keeping with normal sinus rhythm (NSR) but frequent PVCs were present. I printed some screen shots that will be attached to the technical data.   IMPRESSION:  Complex, mostly Obstructive Sleep Apnea (OSA) in moderate severity at AHI of 25.7/h and with REM exacerbation.  There was no Periodic Limb Movement Disorder (PLMD) Mild  Snoring Few Central Sleep Apnea (13 central and 28 obstructive apneas and many hypopneas)  Dysfunctions associated with sleep stages or arousal from sleep PVCs were frequently seen in the single channel EKG.   RECOMMENDATIONS:  Advise full night, attended, PAP titration study to optimize therapy. The patient has highly fragmented sleep which we will not recognize in an autotitration trial, as well as complex apnea, which makes him prone to develop treatment emergent central apnea.   Plan B: If Insurance is not authorizing an in laboratory titration, the patient can start with auto-CPAP 5-12 cm water, no EPR and heated humidity, will need an in person fitting for a comfortable mask.      I certify that I have reviewed the entire raw data recording prior to the issuance of this report in accordance with the Standards of Accreditation of the [employer] [employer] (AASM)  Larey Seat, MD Medical Director, [facility] [place] [facility] (Neurology and Sleep Medicine)

## 2021-12-13 NOTE — Addendum Note (Signed)
Addended by: Larey Seat on: 12/13/2021 07:22 PM   Modules accepted: Orders

## 2021-12-14 ENCOUNTER — Telehealth: Payer: Self-pay

## 2021-12-14 ENCOUNTER — Other Ambulatory Visit: Payer: Self-pay

## 2021-12-14 ENCOUNTER — Ambulatory Visit (INDEPENDENT_AMBULATORY_CARE_PROVIDER_SITE_OTHER)
Admission: RE | Admit: 2021-12-14 | Discharge: 2021-12-14 | Disposition: A | Discharge status: Home / Self Care | Payer: Medicare Other | Source: Ambulatory Visit | Attending: Physician Assistant | Admitting: Physician Assistant

## 2021-12-14 DIAGNOSIS — I712 Thoracic aortic aneurysm, without rupture, unspecified: Secondary | ICD-10-CM | POA: Diagnosis not present

## 2021-12-14 DIAGNOSIS — I77810 Thoracic aortic ectasia: Secondary | ICD-10-CM | POA: Diagnosis not present

## 2021-12-14 MED ORDER — IOHEXOL 350 MG/ML SOLN
100.0000 mL | Freq: Once | INTRAVENOUS | Status: AC | PRN
  Administered 2021-12-14: 100 mL via INTRAVENOUS

## 2021-12-14 NOTE — Telephone Encounter (Addendum)
I called pt. I advised pt that Dr. Brett Fairy reviewed their sleep study results and found that has sleep apnea and recommends that pt be treated with a cpap. Dr. Brett Fairy recommends that pt return for a repeat sleep study in order to properly titrate the cpap and ensure a good mask fit. Pt is agreeable to returning for a titration study. I advised pt that our sleep lab will file with pt's insurance and call pt to schedule the sleep study when we hear back from the pt's insurance regarding coverage of this sleep study. I advised him that Dr. Brett Fairy prescribed alprazolam for her to take 1 tablet PRN during his sleep study. Pt verbalized understanding of results. Pt had no questions at this time but was encouraged to call back if questions arise.

## 2021-12-14 NOTE — Telephone Encounter (Signed)
-----   Message from Larey Seat, MD sent at 12/13/2021  7:22 PM EST ----- Patient with pulmonary fibrosis, RLS, and hypersomnia. Weaned off paxil in preparation for this test.  The patient experienced highly fragmented sleep,  Audio and video analysis did not show any abnormal or unusual movements, behaviors, phonations or vocalizations.   Snoring was noted. EKG was in keeping with normal sinus rhythm (NSR) but frequent PVCs were present. I printed some screen shots that will be attached to the technical data.   IMPRESSION:  1. 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  2. There was no Periodic Limb Movement Disorder (PLMD) 3. Mild Snoring 4. Few Central Sleep Apnea (13 central and 28 obstructive apneas and many hypopneas)  5. Dysfunctions associated with sleep stages or arousal from sleep 6. PVCs were frequently seen in the single channel EKG.   RECOMMENDATIONS:  1. Advise full night, attended, PAP titration study to optimize therapy. The patient has highly fragmented sleep which we will not recognize in an autotitration trial, as well as complex apnea, which makes him prone to develop treatment emergent central apnea.   2. Plan B: If Insurance is not authorizing an in laboratory titration, the patient can start with auto-CPAP 5-12 cm water, no EPR and heated humidity, will need an in person fitting for a comfortable mask.     I ordered 0.25 mg Xanax generic form prn for in- sleep lab use.

## 2021-12-15 ENCOUNTER — Other Ambulatory Visit: Payer: Self-pay | Admitting: *Deleted

## 2021-12-15 ENCOUNTER — Encounter: Payer: Self-pay | Admitting: Physician Assistant

## 2021-12-15 DIAGNOSIS — I493 Ventricular premature depolarization: Secondary | ICD-10-CM

## 2021-12-17 ENCOUNTER — Ambulatory Visit (INDEPENDENT_AMBULATORY_CARE_PROVIDER_SITE_OTHER): Payer: Medicare Other

## 2021-12-17 DIAGNOSIS — K219 Gastro-esophageal reflux disease without esophagitis: Secondary | ICD-10-CM

## 2021-12-17 DIAGNOSIS — I5020 Unspecified systolic (congestive) heart failure: Secondary | ICD-10-CM

## 2021-12-17 DIAGNOSIS — J301 Allergic rhinitis due to pollen: Secondary | ICD-10-CM

## 2021-12-17 DIAGNOSIS — E785 Hyperlipidemia, unspecified: Secondary | ICD-10-CM

## 2021-12-17 DIAGNOSIS — R3916 Straining to void: Secondary | ICD-10-CM

## 2021-12-17 DIAGNOSIS — N401 Enlarged prostate with lower urinary tract symptoms: Secondary | ICD-10-CM

## 2021-12-17 NOTE — Progress Notes (Signed)
Chronic Care Management Pharmacy Note  12/18/2021 Name:  David Goodman MRN:  [id number] DOB:  11/15/1941  Summary: Patient presents for initial CCM consult. HE was recently started on Entresto, which he is tolerating well so far.   Recommendations/Changes made from today's visit: Continue current medications  Plan: CPP follow-up 6 months   Subjective: David Goodman is an 81 y.o. year old male who is a primary patient of Rudd, Lillette Boxer, MD.  The CCM team was consulted for assistance with disease management and care coordination needs.    Engaged with patient by telephone for initial visit in response to provider referral for pharmacy case management and/or care coordination services.   Consent to Services:  The patient was given the following information about Chronic Care Management services today, agreed to services, and gave verbal consent: 1. CCM service includes personalized support from designated clinical staff supervised by the primary care provider, including individualized plan of care and coordination with other care providers 2. 24/7 contact phone numbers for assistance for urgent and routine care needs. 3. Service will only be billed when office clinical staff spend 20 minutes or more in a month to coordinate care. 4. Only one practitioner may furnish and bill the service in a calendar month. 5.The patient may stop CCM services at any time (effective at the end of the month) by phone call to the office staff. 6. The patient will be responsible for cost sharing (co-pay) of up to 20% of the service fee (after annual deductible is met). Patient agreed to services and consent obtained.  Patient Care Team: Haydee Salter, MD as PCP - General (Family Medicine) Jettie Booze, MD as PCP - Cardiology (Cardiology) Germaine Pomfret, [facility] as Pharmacist (Pharmacist) Jettie Booze, MD as Consulting Physician (Cardiology) Hortencia Pilar, MD as Consulting Physician  (Surgery)  Recent office visits: 07/10/2021 Dr. Gena Fray MD (PCP) start cetirizine 10 mg daily, Return in about 3 months   Recent consult visits: 12/09/2021 Richardson Dopp PA-C (Cardiology) stop Losartan, start Entresto 24-26 mg 1 tablet 2 times daily 09/21/2021 Dr. Brett Fairy MD (Neurology) No medication changes noted 09/08/2021 Richardson Dopp PA_C (Cardiology) Start Metoprolol 12.5 mg daily, decrease clopidogreal bisulfate to 75 mg daily, Return in about 3 months  07/22/2021 Drue Novel RN (Cardiology) start Nitroglycerin 0.4 mg PRN 07/08/2021 Dr. Irish Lack MD (Cardiology)  Change Metoprolol to 50 mg one tablet 2 times daily 06/25/2021 Dr. Irish Lack MD (Cardiology) No Medication Changes noted  Hospital visits: Admitted to the hospital on 11/26/2021 due to Rhinosinusitis. Discharge date was 11/26/2021. Discharged from [facility] [place]?Medications Started at [facility] Discharge:?? -started ipratropium 0.6% - started promethazine-dextromethorphan 5 ml    Medication Changes at Hospital Discharge: -Changed None   Medications Discontinued at Hospital Discharge: -Stopped None   Medications that remain the same after Hospital Discharge:??  -All other medications will remain the same.    Admitted to the hospital on 08/25/2021 due to Coronary artery disease. Discharge date was 08/25/2021. Discharged from [place]?Medications Started at [facility] Discharge:?? -started None   Medication Changes at Hospital Discharge: -Changed None   Medications Discontinued at Hospital Discharge: -Stopped None   Medications that remain the same after Hospital Discharge:??  -All other medications will remain the same.    Admitted to the hospital on 07/28/2021 due to Chronic Systolic Heart Failure. Discharge date was 07/28/2021. Discharged from [facility].  New?Medications Started at [facility] Discharge:?? -started none   Medication  Changes at Hospital Discharge: -Changed none   Medications Discontinued at Hospital Discharge: -Stopped none   Medications that remain the same after Hospital Discharge:??  -All other medications will remain the same.    Objective:  Lab Results  Component Value Date   CREATININE 1.16 12/09/2021   BUN 18 12/09/2021   GFR 81.92 07/10/2021   GFRNONAA >60 06/04/2021   GFRAA >60 07/18/2020   NA 136 12/09/2021   K 4.7 12/09/2021   CALCIUM 9.3 12/09/2021   CO2 25 12/09/2021   GLUCOSE 89 12/09/2021    Lab Results  Component Value Date/Time   HGBA1C 5.6 02/27/2021 09:50 AM   HGBA1C 5.2 07/17/2020 02:12 AM   GFR 81.92 07/10/2021 10:16 AM   GFR 69.05 05/11/2021 09:17 AM    Last diabetic Eye exam:  Lab Results  Component Value Date/Time   HMDIABEYEEXA No Retinopathy 09/01/2021 12:00 AM    Last diabetic Foot exam: No results found for: HMDIABFOOTEX   Lab Results  Component Value Date   CHOL 114 12/09/2021   HDL 42 12/09/2021   LDLCALC 41 12/09/2021   LDLDIRECT 129.0 02/27/2021   TRIG 189 (H) 12/09/2021   CHOLHDL 2.7 12/09/2021    Hepatic Function Latest Ref Rng & Units 12/09/2021 06/04/2021 05/11/2021  Total Protein 6.0 - 8.5 g/dL 6.4 6.6 6.8  Albumin 3.7 - 4.7 g/dL 4.4 3.9 4.4  AST 0 - 40 IU/L $Remov'23 21 15  'guXZWz$ ALT 0 - 44 IU/L $Remov'18 18 14  'OlRVsn$ Alk Phosphatase 44 - 121 IU/L 83 51 57  Total Bilirubin 0.0 - 1.2 mg/dL 0.4 0.3 0.7  Bilirubin, Direct 0.0 - 0.3 mg/dL - - -    Lab Results  Component Value Date/Time   TSH 3.93 05/11/2021 09:17 AM   TSH 3.04 05/02/2019 08:18 AM    CBC Latest Ref Rng & Units 08/18/2021 07/22/2021 06/04/2021  WBC 3.4 - 10.8 x10E3/uL 8.7 11.0(H) 7.8  Hemoglobin 13.0 - 17.7 g/dL 13.6 13.9 13.4  Hematocrit 37.5 - 51.0 % 39.9 40.8 39.4  Platelets 150 - 450 x10E3/uL 243 222 228    Lab Results  Component Value Date/Time   VD25OH 36.47 05/02/2019 08:18 AM   VD25OH 33.23 10/31/2018 08:20 AM    Clinical ASCVD: No  The ASCVD Risk score (Arnett DK, et al.,  2019) failed to calculate for the following reasons:   The 2019 ASCVD risk score is only valid for ages 29 to 73    Depression screen PHQ 2/9 05/29/2021 05/29/2021 02/27/2021  Decreased Interest 2 2 0  Down, Depressed, Hopeless 2 2 0  PHQ - 2 Score 4 4 0  Altered sleeping 3 3 -  Tired, decreased energy 3 3 -  Change in appetite 0 0 -  Feeling bad or failure about yourself  0 0 -  Trouble concentrating 3 3 -  Moving slowly or fidgety/restless 2 2 -  Suicidal thoughts 0 0 -  PHQ-9 Score 15 15 -  Difficult doing work/chores Not difficult at all Not difficult at all -  Some recent data might be hidden    Social History   Tobacco Use  Smoking Status Never  Smokeless Tobacco Never   BP Readings from Last 3 Encounters:  12/18/21 128/72  11/26/21 (!) 150/89  09/21/21 (!) 167/84   Pulse Readings from Last 3 Encounters:  12/18/21 73  11/26/21 70  09/21/21 75   Wt Readings from Last 3 Encounters:  12/18/21 175  lb (79.4 kg)  09/21/21 179 lb 8 oz (81.4 kg)  09/08/21 179 lb 12.8 oz (81.6 kg)   BMI Readings from Last 3 Encounters:  12/18/21 29.57 kg/m  09/21/21 30.34 kg/m  09/08/21 30.39 kg/m    Assessment/Interventions: Review of patient past medical history, allergies, medications, health status, including review of consultants reports, laboratory and other test data, was performed as part of comprehensive evaluation and provision of chronic care management services.   SDOH:  (Social Determinants of Health) assessments and interventions performed: Yes SDOH Interventions    Flowsheet Row Most Recent Value  SDOH Interventions   Financial Strain Interventions Intervention Not Indicated      SDOH Screenings   Alcohol Screen: Not on file  Depression (PHQ2-9): Medium Risk   PHQ-2 Score: 15  Financial Resource Strain: Low Risk    Difficulty of Paying Living Expenses: Not hard at all  Food Insecurity: Not on file  Housing: Not on file  Physical Activity: Not on file  Social  Connections: Not on file  Stress: Not on file  Tobacco Use: Low Risk    Smoking Tobacco Use: Never   Smokeless Tobacco Use: Never   Passive Exposure: Not on file  Transportation Needs: No Transportation Needs   Lack of Transportation (Medical): No   Lack of Transportation (Non-Medical): No    CCM Care Plan  No Known Allergies  Medications Reviewed Today     Reviewed by Harold Hedge, CMA (Certified Medical Assistant) on 12/18/21 at [place] List Status: <None>   Medication Order Taking? Sig Documenting Provider Last Dose Status Informant  Ascorbic Acid (VITAMIN C) 1000 MG tablet [id number] Yes Take 1,000 mg by mouth 2 (two) times a week. [provider] Taking Active Self           Med Note Caryn Section, [place] A   Fri Jul 24, 2021  1:44 PM) On hold  aspirin EC 81 MG tablet [id number] Yes Take 81 mg by mouth at bedtime. Swallow whole. [provider] Taking Active Self  cetirizine (ZYRTEC) 10 MG tablet [id number] Yes Take 1 tablet (10 mg total) by mouth daily. Haydee Salter, MD Taking Active Self  clopidogrel (PLAVIX) 75 MG tablet [id number] Yes Take 1 tablet (75 mg total) by mouth daily. Take one tablet by mouth ( 75 mg) daily. Richardson Dopp T, PA-C Taking Active   fluticasone (FLONASE) 50 MCG/ACT nasal spray [id number] Yes Place 1 spray into both nostrils 2 (two) times daily. Haydee Salter, MD Taking Active   latanoprost (XALATAN) 0.005 % ophthalmic solution [id number] Yes Place 1 drop into both eyes at bedtime. Marletta Lor, MD Taking Active Self  MAGNESIUM PO [id number] Yes Take 400 mg by mouth at bedtime. [provider] Taking Active Self  metoprolol succinate (TOPROL XL) 25 MG 24 hr tablet [id number] Yes Take 0.5 tablets (12.5 mg total) by mouth daily. Richardson Dopp T, PA-C Taking Active   Multiple Vitamin (MULTIVITAMIN WITH MINERALS) TABS tablet [id number] Yes Take 1 tablet by mouth daily. [provider] Taking Active Self           Med  Note Caryn Section, [place] A   Fri Jul 24, 2021  1:43 PM) On hold  naproxen (NAPROSYN) 500 MG tablet [id number] Yes TAKE 1 TABLET BY MOUTH  TWICE DAILY WITH MEALS  Patient taking differently: Take 500 mg by mouth daily as needed.   Isaac Bliss, Rayford Halsted, MD Taking Active   nitroGLYCERIN (NITROSTAT) 0.4 MG SL tablet [id number]  Yes Place 1 tablet (0.4 mg total) under the tongue every 5 (five) minutes as needed for chest pain. Jettie Booze, MD Taking Active Self  pantoprazole (PROTONIX) 40 MG tablet [id number] Yes Take 1 tablet (40 mg total) by mouth daily. Jettie Booze, MD Taking Active Self  PARoxetine (PAXIL) 20 MG tablet [id number] Yes Take 1 tablet (20 mg total) by mouth daily. Haydee Salter, MD Taking Active   psyllium (METAMUCIL) 58.6 % powder [id number] Yes Take 1 packet by mouth 3 (three) times daily. [provider] Taking Active   rosuvastatin (CRESTOR) 20 MG tablet [id number] Yes Take 1 tablet (20 mg total) by mouth daily. Cheryln Manly, NP Taking Active   sacubitril-valsartan [facility]) 24-26 [place] [id number] Yes Take 1 tablet by mouth 2 (two) times daily. Richardson Dopp T, PA-C Taking Active   tadalafil (CIALIS) 20 MG tablet [id number] Yes Take 1 tablet (20 mg total) by mouth daily as needed for erectile dysfunction. 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   Vivid dream 09/21/2021   Excessive daytime sleepiness 09/21/2021   Snoring 09/21/2021   Coronary artery disease involving native coronary artery of native heart 09/21/2021   SOBOE (shortness of breath on exertion) 09/21/2021   Coronary artery disease    HFrEF (heart failure with reduced ejection fraction) ([place])    Bell's palsy, left 06/05/2021   Cerebrovascular  disease 06/05/2021   Hiatal hernia 05/29/2021   Depression, major, single episode, mild ([place]) 05/29/2021   Glaucoma 02/27/2021   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   Erectile dysfunction due to arterial insufficiency 02/27/2021   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   Cough 12/17/2020   Hemorrhoids 07/17/2020   RLS (restless legs syndrome) 10/17/2019   Syncope 11/15/2018   Diverticulitis 05/25/2018   Osteoarthritis of right knee 12/13/2017   Osteoarthritis of left knee 12/13/2017   Testosterone deficiency 03/22/2017   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/27/2016   GERD (gastroesophageal reflux disease) 09/27/2016   BPH (benign prostatic hyperplasia) 06/18/2013   Dyslipidemia 07/13/2007   Essential hypertension 07/13/2007   Allergic rhinitis 07/13/2007   Peptic ulcer disease 07/13/2007   History of nephrolithiasis 07/13/2007    Immunization History  Administered Date(s) Administered   Fluad Quad(high Dose 65+) 08/10/2019   Influenza, High Dose Seasonal PF 07/31/2015, 09/27/2016, 10/10/2017, 09/30/2018   Influenza,inj,quad, With Preservative 09/30/2018   Influenza-Unspecified 09/14/2017, 09/30/2018, 10/28/2020   PFIZER(Purple Top)SARS-COV-2 Vaccination 01/21/2020, 02/22/2020, 10/28/2020   Pneumococcal Conjugate-13 05/19/2015   Pneumococcal Polysaccharide-23 07/16/2009, 09/30/2018   Pneumococcal-Unspecified 09/30/2018   Td 07/16/2009   Tdap 05/19/2015   Zoster, Live 04/19/2011    Conditions to be addressed/monitored:  Hypertension, Hyperlipidemia, Heart Failure, Coronary Artery Disease, GERD, Depression, Osteoarthritis, and BPH  Care Plan : [place]  Updates made by Germaine Pomfret, RPH since 12/18/2021 12:00 AM     Problem: Hypertension, Hyperlipidemia, Heart Failure, Coronary Artery Disease, GERD, Depression, Osteoarthritis, and BPH   Priority: High     Long-Range Goal: Patient-Specific Goal   Start  Date: 12/17/2021  Expected End Date: 12/18/2022  This Visit's Progress: On track  Recent Progress: On track  Priority: High  Note:   Current Barriers:  No barriers noted   Pharmacist Clinical Goal(s):  Patient will maintain control of hypertension as evidenced by BP less than 140/90  through collaboration with PharmD and provider.   Interventions: 1:1 collaboration with Haydee Salter, MD regarding development and update of comprehensive plan of care as evidenced by provider attestation and co-signature Inter-disciplinary care team collaboration (see longitudinal plan of care) Comprehensive medication review performed; medication list updated in electronic medical record  Heart Failure (Goal: manage symptoms and prevent exacerbations) -Controlled -Last ejection fraction: 40% (Date: 12/09/21) -HF type: Systolic -NYHA Class: II (slight limitation of activity) -Current treatment: Metoprolol XL 25 mg 1/2 tablet daily: Appropriate, Effective, Safe, Accessible Entresto 24-26 mg twice daily: Appropriate, Effective, Safe, Accessible -Medications previously tried: HCTZ  -Current home BP/HR readings: Does not routinely monitor blood pressure at home.  -Has only taken Entresto for a few days, but so far denies new side effects or signs of hypotension.  -Patient reports Entresto 100% covered under insurance and is affordable.  -Recommended to continue current medication  Hyperlipidemia: (LDL goal < 70) -Controlled -Current treatment: None -Current treatment: Aspirin 81 mg daily  Clopidogrel 75 mg daily  -Medications previously tried: NA  -Patient reports he was instructed to discontinue rosuvastatin after hospitalization. Rosuvastatin still on active medication list, no documentation regarding discontinuation noted. Patient to discuss at cardiology follow-up. -Recommended to continue current medication  Depression/Anxiety (Goal: Maintain symptom remission) -Controlled -Current  treatment:  Paroxetine 20 mg daily: Appropriate, Effective, Query Safe -Medications previously tried/failed: NA -PHQ9: 15 -Does occasionally have a glass of wine or whiskey with dinner, counseled patient on risk of alcohol use with paroxetine.  -Recommended to continue current medication  GERD (Goal: Prevent heartburn) -Controlled -Current treatment  Pantoprazole 40 mg daily: Appropriate, Effective, Safe, Accessible  -Medications previously tried: Prilosec -Recommended to continue current medication  BPH (Goal: Minimize urinary symptoms) -Controlled -Current treatment  Tamsulosin 0.4 mg daily: Appropriate, Effective, Safe, Accessible  -Medications previously tried: NA  -Recommended to continue current medication  Allergic Rhinitis (Goal: Prevent allergy symptoms) -Controlled -Current treatment  Cetirizine 10 mg daily: Appropriate, Effective, Safe, Accessible  Flonase 68mcg/act 1 spray twice daily: Appropriate, Effective, Safe, Accessible  -Medications previously tried: NA  -Recommended to continue current medication  Patient Goals/Self-Care Activities Patient will:  - check blood pressure weekly, document, and provide at future appointments weigh daily, and contact provider if weight gain of greater than 3 pounds in 24 hours  Follow Up Plan: Telephone follow up appointment with care management team member scheduled for:  06/17/2022 at 3:00 PM      Medication Assistance: None required.  Patient affirms current coverage meets needs.  Compliance/Adherence/Medication fill history: Care Gaps: Entresto 24-26 mg last filled 12/10/2021 30 day supply at [facility].  Rosuvastatin 20 mg last filled 10/14/2021 90 day supply at [facility].   Star-Rating Drugs: Shingrix Vaccine COVID-19 Vaccine Influenza Vaccine  Patient's preferred pharmacy is:  [facility] ([place], [facility] [address] [place] [address] Phone: [phone] Fax: [phone]  [facility] ([facility] ) - [place], [place] [address] Phone: [phone] Fax: [phone]  [facility] [address] [place], [place] [address] [place] [address] Phone: [phone] Fax: [phone]  [place], [place]  [place] [address] Phone: [phone] Fax: [phone]  Uses pill box? Yes Pt endorses 100% compliance  We discussed: Current pharmacy is preferred with insurance plan and patient is satisfied with pharmacy services Patient decided to: Continue current medication management strategy  Care Plan and Follow Up Patient Decision:  Patient agrees to Care Plan and Follow-up.  Plan: Telephone follow up appointment with care management team member scheduled for:  06/17/2022 at 3:00 PM  Junius Argyle, PharmD, Para [place], CPP Clinical Pharmacist Practitioner  [facility] at [facility]  [phone]

## 2021-12-17 NOTE — Progress Notes (Signed)
Cardiology Office Note   Date:  12/18/2021   ID:  Ociel Goodman, DOB 1941/04/27, MRN [id number]  PCP:  Haydee Salter, MD    No chief complaint on file.  CAD  Wt Readings from Last 3 Encounters:  12/18/21 175 lb (79.4 kg)  09/21/21 179 lb 8 oz (81.4 kg)  09/08/21 179 lb 12.8 oz (81.6 kg)       History of Present Illness: David Goodman is a 81 y.o. male  with:  Coronary artery disease  S/p DES to p-mLAD in 9/22 Cath 9/22: OM1 80, D2 80, dLAD 25, mRCA 60 >> Med Rx HFmrEF (heart failure with mildly reduced ejection fraction)  Echocardiogram 8/22: 40-45, Gr 1 DD, global HK Mild to mod MR (echocardiogram 8/22) Hypertension  Hyperlipidemia  Glucose intolerance GERD/PUD Iron deficiency anemia Aortic atherosclerosis  Echo: "07/07/2021 EF 40-45, global HK, mild LVH, GR 1 DD, normal RVSF, mild LAE, mild-moderate MR, mild AI, trivial TR, mild PI"   Prior CV studies: CORONARY STENT INTERVENTION, INTRAVASCULAR LITHOTRIPSY 08/25/2021 Narrative   Prox LAD to Mid LAD lesion is 75% stenosed.   After shockwave lithotripsy, a drug-eluting stent was successfully placed using a STENT ONYX FRONTIER 3.0X38, postdilated to 4 mm.  The stent was optimized with intravascular ultrasound.   Post intervention, there is a 0% residual stenosis.   Moderate diffuse disease noted in the remainder of the LAD.   LV end diastolic pressure is normal.   There is no aortic valve stenosis.  F/u echo showed: "EF has not improved since PCI.  It remains ~ 40%.  Mitral regurgitation is also stable.  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  His BP should be able to tolerate changing Losartan to Entresto. PLAN:  - DC Losartan - Entresto 24/26 mg twice daily  - BMET at f/u with Dr. Irish Lack later this month. - Arrange Chest/Aorta CTA to size aorta better - Keep f/u with Dr. Irish Lack later this month to discuss other med adjustments for HF"    Past Medical History:  Diagnosis Date   ALLERGIC RHINITIS 07/13/2007    Aorta dilated on Echo; Normal sized Aorta on CT    Chest CT 1/23: No thoracic aortic aneurysm; coronary calcifications; aortic atherosclerosis; improved aeration of lungs with persistent minimal reticular opacities   GERD (gastroesophageal reflux disease)    HYPERLIPIDEMIA 07/13/2007   HYPERTENSION 07/13/2007   Impaired glucose tolerance 09/27/2016   Iron deficiency anemia due to chronic blood loss 02/27/2021   NEPHROLITHIASIS, 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   PEPTIC ULCER DISEASE 07/13/2007   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    Past Surgical History:  Procedure Laterality Date   BROW LIFT Bilateral 11/10/2020   Procedure: BLEPHAROPLASTY;  Surgeon: [name], MD;  Location: [facility];  Service: Plastics;  Laterality: Bilateral;  1 hour   COLONOSCOPY WITH PROPOFOL N/A 02/17/2019   Procedure: COLONOSCOPY WITH PROPOFOL;  Surgeon: Ronnette Juniper, MD;  Location: [facility];  Service: Gastroenterology;  Laterality: N/A;   CORONARY STENT INTERVENTION N/A 08/25/2021   Procedure: CORONARY STENT INTERVENTION;  Surgeon: Jettie Booze, MD;  Location: [facility];  Service: Cardiovascular;  Laterality: N/A;   HYDROCELE EXCISION Right 05/15/2003   INTRAVASCULAR LITHOTRIPSY  08/25/2021   Procedure: INTRAVASCULAR LITHOTRIPSY;  Surgeon: Jettie Booze, MD;  Location: [facility];  Service: Cardiovascular;;   INTRAVASCULAR ULTRASOUND/IVUS N/A 08/25/2021   Procedure: Intravascular Ultrasound/IVUS;  Surgeon: Jettie Booze, MD;  Location: [facility];  Service: Cardiovascular;  Laterality: N/A;   IR ANGIOGRAM VISCERAL SELECTIVE  02/16/2019   IR ANGIOGRAM VISCERAL SELECTIVE  02/16/2019   IR ANGIOGRAM VISCERAL SELECTIVE  02/16/2019   IR US GUIDE VASC ACCESS RIGHT  02/16/2019   LEFT HEART CATH AND CORONARY ANGIOGRAPHY N/A 07/28/2021   Procedure: LEFT HEART CATH AND CORONARY ANGIOGRAPHY;  Surgeon: Jettie Booze, MD;  Location: [facility];   Service: Cardiovascular;  Laterality: N/A;   LEFT HEART CATH AND CORONARY ANGIOGRAPHY N/A 08/25/2021   Procedure: LEFT HEART CATH AND CORONARY ANGIOGRAPHY;  Surgeon: Jettie Booze, MD;  Location: [facility];  Service: Cardiovascular;  Laterality: N/A;   ROTATOR CUFF REPAIR     SPERMATOCELECTOMY Right 05/15/2003     Current Outpatient Medications  Medication Sig Dispense Refill   Ascorbic Acid (VITAMIN C) 1000 MG tablet Take 1,000 mg by mouth 2 (two) times a week.     aspirin EC 81 MG tablet Take 81 mg by mouth at bedtime. Swallow whole.     cetirizine (ZYRTEC) 10 MG tablet Take 1 tablet (10 mg total) by mouth daily. 30 tablet 11   clopidogrel (PLAVIX) 75 MG tablet Take 1 tablet (75 mg total) by mouth daily. Take one tablet by mouth ( 75 mg) daily. 90 tablet 3   fluticasone (FLONASE) 50 MCG/ACT nasal spray Place 1 spray into both nostrils 2 (two) times daily. 15.8 mL 6   latanoprost (XALATAN) 0.005 % ophthalmic solution Place 1 drop into both eyes at bedtime. 7.5 mL 1   MAGNESIUM PO Take 400 mg by mouth at bedtime.     metoprolol succinate (TOPROL XL) 25 MG 24 hr tablet Take 0.5 tablets (12.5 mg total) by mouth daily. 45 tablet 3   Multiple Vitamin (MULTIVITAMIN WITH MINERALS) TABS tablet Take 1 tablet by mouth daily.     naproxen (NAPROSYN) 500 MG tablet TAKE 1 TABLET BY MOUTH  TWICE DAILY WITH MEALS (Patient taking differently: Take 500 mg by mouth daily as needed.) 180 tablet 3   nitroGLYCERIN (NITROSTAT) 0.4 MG SL tablet Place 1 tablet (0.4 mg total) under the tongue every 5 (five) minutes as needed for chest pain. 25 tablet 3   pantoprazole (PROTONIX) 40 MG tablet Take 1 tablet (40 mg total) by mouth daily. 30 tablet 6   PARoxetine (PAXIL) 20 MG tablet Take 1 tablet (20 mg total) by mouth daily. 90 tablet 3   psyllium (METAMUCIL) 58.6 % powder Take 1 packet by mouth 3 (three) times daily.     rosuvastatin (CRESTOR) 20 MG tablet Take 1 tablet (20 mg total) by mouth daily. 30  tablet 11   sacubitril-valsartan (ENTRESTO) 24-26 MG Take 1 tablet by mouth 2 (two) times daily. 60 tablet 11   tadalafil (CIALIS) 20 MG tablet Take 1 tablet (20 mg total) by mouth daily as needed for erectile dysfunction. 30 tablet 2   tamsulosin (FLOMAX) 0.4 MG CAPS capsule TAKE 1 CAPSULE BY MOUTH  DAILY 90 capsule 3   No current facility-administered medications for this visit.    Allergies:   Patient has no known allergies.    Social History:  The patient  reports that he has never smoked. He has never used smokeless tobacco. He reports current alcohol use. He reports that he does not use drugs.   Family History:  The patient's family history includes Cancer in his brother.    ROS:  Please see the history of present illness.   Otherwise, review of systems are positive for difficulty  losing weight.   All other systems are reviewed and negative.    PHYSICAL EXAM: VS:  BP 128/72    Pulse 73    Ht 5' 4.5" (1.638 m)    Wt 175 lb (79.4 kg)    SpO2 96%    BMI 29.57 kg/m  , BMI Body mass index is 29.57 kg/m. GEN: Well nourished, well developed, in no acute distress HEENT: normal Neck: no JVD, carotid bruits, or masses Cardiac: RRR; no murmurs, rubs, or gallops,no edema  Respiratory:  clear to auscultation bilaterally, normal work of breathing GI: soft, nontender, nondistended, + BS MS: no deformity or atrophy Skin: warm and dry, no rash Neuro:  Strength and sensation are intact Psych: euthymic mood, full affect   EKG:   The ekg ordered today demonstrates NSR, nonspecific ST-T wave changes   Recent Labs: 05/11/2021: TSH 3.93 08/18/2021: Hemoglobin 13.6; Platelets 243 09/08/2021: Magnesium 2.1 12/09/2021: ALT 18; BUN 18; Creatinine, Ser 1.16; Potassium 4.7; Sodium 136   Lipid Panel    Component Value Date/Time   CHOL 114 12/09/2021 0838   TRIG 189 (H) 12/09/2021 0838   HDL 42 12/09/2021 0838   CHOLHDL 2.7 12/09/2021 0838   CHOLHDL 4 02/27/2021 0950   VLDL 46.4 (H)  02/27/2021 0950   LDLCALC 41 12/09/2021 0838   LDLDIRECT 129.0 02/27/2021 0950     Other studies Reviewed: Additional studies/ records that were reviewed today with results demonstrating: Labs reviewed, A1c 5.6 in April 2022.   ASSESSMENT AND PLAN:  CAD: No angina.  Continue aggressive secondary prevention.  Increase exercise gradually.  Hopefully this will help with weight loss and improving his stamina. Chronic systolic heart failure: EF remained at 40 to 45%.  Tolerating Entresto.  COntinue Entresto.  BMet today.  Blood pressure control.  We will keep the dose of Entresto the same. HTN: The current medical regimen is effective;  continue present plan and medications. Hyperlipidemia: LDL 41 in January 2023. PVCs: No sx. monitor planned for today to quantify PVCs. 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  No need for follow-up imaging.   Current medicines are reviewed at length with the patient today.  The patient concerns regarding his medicines were addressed.  The following changes have been made:  No change  Labs/ tests ordered today include: BMet No orders of the defined types were placed in this encounter.   Recommend 150 minutes/week of aerobic exercise Low fat, low carb, high fiber diet recommended  Disposition:   FU in 6 months   Signed, Larae Grooms, MD  12/18/2021 9:40 AM    [facility] [place], [place],   [address] Phone: [phone]; Fax: [phone]

## 2021-12-18 ENCOUNTER — Encounter: Payer: Self-pay | Admitting: Interventional Cardiology

## 2021-12-18 ENCOUNTER — Other Ambulatory Visit: Payer: Self-pay

## 2021-12-18 ENCOUNTER — Other Ambulatory Visit (INDEPENDENT_AMBULATORY_CARE_PROVIDER_SITE_OTHER): Payer: Medicare Other

## 2021-12-18 ENCOUNTER — Ambulatory Visit: Payer: Medicare Other | Admitting: Interventional Cardiology

## 2021-12-18 VITALS — BP 128/72 | HR 73 | Ht 64.5 in | Wt 175.0 lb

## 2021-12-18 DIAGNOSIS — I493 Ventricular premature depolarization: Secondary | ICD-10-CM

## 2021-12-18 DIAGNOSIS — I10 Essential (primary) hypertension: Secondary | ICD-10-CM | POA: Diagnosis not present

## 2021-12-18 DIAGNOSIS — E7800 Pure hypercholesterolemia, unspecified: Secondary | ICD-10-CM

## 2021-12-18 DIAGNOSIS — I77810 Thoracic aortic ectasia: Secondary | ICD-10-CM | POA: Diagnosis not present

## 2021-12-18 DIAGNOSIS — I25118 Atherosclerotic heart disease of native coronary artery with other forms of angina pectoris: Secondary | ICD-10-CM | POA: Diagnosis not present

## 2021-12-18 LAB — BASIC METABOLIC PANEL
BUN/Creatinine Ratio: 22 (ref 10–24)
BUN: 21 mg/dL (ref 8–27)
CO2: 23 mmol/L (ref 20–29)
Calcium: 9.3 mg/dL (ref 8.6–10.2)
Chloride: 99 mmol/L (ref 96–106)
Creatinine, Ser: 0.96 mg/dL (ref 0.76–1.27)
Glucose: 85 mg/dL (ref 70–99)
Potassium: 5.2 mmol/L (ref 3.5–5.2)
Sodium: 136 mmol/L (ref 134–144)
eGFR: 80 mL/min/{1.73_m2} (ref >59)

## 2021-12-18 NOTE — Patient Instructions (Signed)
Visit Information It was great speaking with you today!  Please let me know if you have any questions about our visit.   Goals Addressed             This Visit's Progress    Track and Manage Fluids and Swelling-Heart Failure       Timeframe:  Long-Range Goal Priority:  High Start Date: 12/18/21                            Expected End Date: 12/18/22                      Follow Up within 90 days   - call office if I gain more than 2 pounds in one day or 5 pounds in one week - keep legs up while sitting - use salt in moderation - watch for swelling in feet, ankles and legs every day - weigh myself daily    Why is this important?   It is important to check your weight daily and watch how much salt and liquids you have.  It will help you to manage your heart failure.    Notes:         Patient Care Plan: CCM Pharmacy Care Plan     Problem Identified: Hypertension, Hyperlipidemia, Heart Failure, Coronary Artery Disease, GERD, Depression, Osteoarthritis, and BPH   Priority: High     Long-Range Goal: Patient-Specific Goal   Start Date: 12/17/2021  Expected End Date: 12/18/2022  This Visit's Progress: On track  Recent Progress: On track  Priority: High  Note:   Current Barriers:  No barriers noted   Pharmacist Clinical Goal(s):  Patient will maintain control of hypertension as evidenced by BP less than 140/90  through collaboration with PharmD and provider.   Interventions: 1:1 collaboration with Haydee Salter, MD regarding development and update of comprehensive plan of care as evidenced by provider attestation and co-signature Inter-disciplinary care team collaboration (see longitudinal plan of care) Comprehensive medication review performed; medication list updated in electronic medical record  Heart Failure (Goal: manage symptoms and prevent exacerbations) -Controlled -Last ejection fraction: 40% (Date: 12/09/21) -HF type: Systolic -NYHA Class: II (slight  limitation of activity) -Current treatment: Metoprolol XL 25 mg 1/2 tablet daily: Appropriate, Effective, Safe, Accessible Entresto 24-26 mg twice daily: Appropriate, Effective, Safe, Accessible -Medications previously tried: HCTZ  -Current home BP/HR readings: Does not routinely monitor blood pressure at home.  -Has only taken Entresto for a few days, but so far denies new side effects or signs of hypotension.  -Patient reports Entresto 100% covered under insurance and is affordable.  -Recommended to continue current medication  Hyperlipidemia: (LDL goal < 70) -Controlled -Current treatment: None -Current treatment: Aspirin 81 mg daily  Clopidogrel 75 mg daily  -Medications previously tried: NA  -Patient reports he was instructed to discontinue rosuvastatin after hospitalization. Rosuvastatin still on active medication list, no documentation regarding discontinuation noted. Patient to discuss at cardiology follow-up. -Recommended to continue current medication  Depression/Anxiety (Goal: Maintain symptom remission) -Controlled -Current treatment:  Paroxetine 20 mg daily: Appropriate, Effective, Query Safe -Medications previously tried/failed: NA -PHQ9: 15 -Does occasionally have a glass of wine or whiskey with dinner, counseled patient on risk of alcohol use with paroxetine.  -Recommended to continue current medication  GERD (Goal: Prevent heartburn) -Controlled -Current treatment  Pantoprazole 40 mg daily: Appropriate, Effective, Safe, Accessible  -Medications previously tried: Prilosec -Recommended to continue current  medication  BPH (Goal: Minimize urinary symptoms) -Controlled -Current treatment  Tamsulosin 0.4 mg daily: Appropriate, Effective, Safe, Accessible  -Medications previously tried: NA  -Recommended to continue current medication  Allergic Rhinitis (Goal: Prevent allergy symptoms) -Controlled -Current treatment  Cetirizine 10 mg daily: Appropriate,  Effective, Safe, Accessible  Flonase 70mcg/act 1 spray twice daily: Appropriate, Effective, Safe, Accessible  -Medications previously tried: NA  -Recommended to continue current medication  Patient Goals/Self-Care Activities Patient will:  - check blood pressure weekly, document, and provide at future appointments weigh daily, and contact provider if weight gain of greater than 3 pounds in 24 hours  Follow Up Plan: Telephone follow up appointment with care management team member scheduled for:  06/17/2022 at 3:00 PM    [name] was given information about Chronic Care Management services today including:  CCM service includes personalized support from designated clinical staff supervised by his physician, including individualized plan of care and coordination with other care providers 24/7 contact phone numbers for assistance for urgent and routine care needs. Standard insurance, coinsurance, copays and deductibles apply for chronic care management only during months in which we provide at least 20 minutes of these services. Most insurances cover these services at 100%, however patients may be responsible for any copay, coinsurance and/or deductible if applicable. This service may help you avoid the need for more expensive face-to-face services. Only one practitioner may furnish and bill the service in a calendar month. The patient may stop CCM services at any time (effective at the end of the month) by phone call to the office staff.  Patient agreed to services and verbal consent obtained.   Patient verbalizes understanding of instructions and care plan provided today and agrees to view in [place]. Active MyChart status confirmed with patient.    Junius Argyle, PharmD, Para [place], CPP Clinical Pharmacist Practitioner  [facility]  [phone]

## 2021-12-18 NOTE — Patient Instructions (Signed)
Medication Instructions:  Your physician recommends that you continue on your current medications as directed. Please refer to the Current Medication list given to you today.  *If you need a refill on your cardiac medications before your next appointment, please call your pharmacy*   Lab Work: Lab work to be done today If you have labs (blood work) drawn today and your tests are completely normal, you will receive your results only by: [place] (if you have MyChart) OR A paper copy in the mail If you have any lab test that is abnormal or we need to change your treatment, we will call you to review the results.   Testing/Procedures: none   Follow-Up: At [facility], you and your health needs are our priority.  As part of our continuing mission to provide you with exceptional heart care, we have created designated Provider Care Teams.  These Care Teams include your primary Cardiologist (physician) and Advanced Practice Providers (APPs -  Physician Assistants and Nurse Practitioners) who all work together to provide you with the care you need, when you need it.  We recommend signing up for the patient portal called "MyChart".  Sign up information is provided on this After Visit Summary.  MyChart is used to connect with patients for Virtual Visits (Telemedicine).  Patients are able to view lab/test results, encounter notes, upcoming appointments, etc.  Non-urgent messages can be sent to your provider as well.   To learn more about what you can do with MyChart, go to [email or web address].    Your next appointment:   6 month(s)  The format for your next appointment:   In Person  Provider:   Larae Grooms, MD     Other Instructions

## 2021-12-18 NOTE — Progress Notes (Unsigned)
Applied a 3 day Zio XT monitor to patient in the office  Dr Irish Lack to read

## 2021-12-21 DIAGNOSIS — I493 Ventricular premature depolarization: Secondary | ICD-10-CM

## 2021-12-23 ENCOUNTER — Telehealth: Payer: Self-pay | Admitting: Neurology

## 2021-12-23 NOTE — Telephone Encounter (Signed)
Called the patient and advised that I did receive a rx refill request from [facility] but the request is for a medication that he is no longer having filled. It was just a one time script for the sleep study. Advised the patient I have also sent this to [facility] to make them aware. Pt verbalized understanding.

## 2021-12-23 NOTE — Telephone Encounter (Signed)
Pt states he has been informed by [facility] that there is a medication refill request they received but there is missing information in the request they they need, pt is asking RN reaches out to [facility]

## 2021-12-25 DIAGNOSIS — I493 Ventricular premature depolarization: Secondary | ICD-10-CM | POA: Diagnosis not present

## 2021-12-29 DIAGNOSIS — I5020 Unspecified systolic (congestive) heart failure: Secondary | ICD-10-CM | POA: Diagnosis not present

## 2021-12-29 DIAGNOSIS — N401 Enlarged prostate with lower urinary tract symptoms: Secondary | ICD-10-CM

## 2021-12-29 DIAGNOSIS — E785 Hyperlipidemia, unspecified: Secondary | ICD-10-CM

## 2021-12-29 DIAGNOSIS — R3916 Straining to void: Secondary | ICD-10-CM | POA: Diagnosis not present

## 2021-12-30 ENCOUNTER — Other Ambulatory Visit: Payer: Self-pay | Admitting: *Deleted

## 2021-12-30 MED ORDER — METOPROLOL SUCCINATE ER 25 MG PO TB24: 25.0000 mg | ORAL_TABLET | Freq: Every day | ORAL | 3 refills | Status: DC

## 2022-01-01 ENCOUNTER — Other Ambulatory Visit: Payer: Self-pay | Admitting: *Deleted

## 2022-01-01 MED ORDER — METOPROLOL SUCCINATE ER 25 MG PO TB24: 25.0000 mg | ORAL_TABLET | Freq: Every day | ORAL | 3 refills | Status: DC

## 2022-01-11 ENCOUNTER — Other Ambulatory Visit: Payer: Self-pay | Admitting: *Deleted

## 2022-01-12 ENCOUNTER — Telehealth: Payer: Self-pay

## 2022-01-12 NOTE — Telephone Encounter (Signed)
Pt called and LVM about needing to speak to someone about a medication question he has. Please contact pt at earliest convenience.

## 2022-01-12 NOTE — Telephone Encounter (Signed)
Called the pt back. He continues to get something from [facility] related to his medication and is confused. I reminded him that we only prescribed 10 tablets for him to have so he can take 1-2 the night of the sleep study which Is scheduled for 2/26. The remaining tablets unused can be used when trying to get use to the machine or just discarded if not needed. There will be no refills needed on this medication. Pt verbalized understanding.

## 2022-01-21 DIAGNOSIS — M170 Bilateral primary osteoarthritis of knee: Secondary | ICD-10-CM | POA: Diagnosis not present

## 2022-01-21 DIAGNOSIS — M25561 Pain in right knee: Secondary | ICD-10-CM | POA: Diagnosis not present

## 2022-01-21 DIAGNOSIS — M25562 Pain in left knee: Secondary | ICD-10-CM | POA: Diagnosis not present

## 2022-01-24 ENCOUNTER — Ambulatory Visit (INDEPENDENT_AMBULATORY_CARE_PROVIDER_SITE_OTHER): Payer: Medicare Other | Admitting: Neurology

## 2022-01-24 ENCOUNTER — Other Ambulatory Visit: Payer: Self-pay

## 2022-01-24 DIAGNOSIS — R0602 Shortness of breath: Secondary | ICD-10-CM

## 2022-01-24 DIAGNOSIS — G4731 Primary central sleep apnea: Secondary | ICD-10-CM | POA: Diagnosis not present

## 2022-01-24 DIAGNOSIS — I2510 Atherosclerotic heart disease of native coronary artery without angina pectoris: Secondary | ICD-10-CM

## 2022-01-24 DIAGNOSIS — G4719 Other hypersomnia: Secondary | ICD-10-CM

## 2022-01-24 DIAGNOSIS — R6889 Other general symptoms and signs: Secondary | ICD-10-CM

## 2022-01-28 ENCOUNTER — Telehealth: Payer: Self-pay

## 2022-01-28 NOTE — Procedures (Signed)
PATIENT'S NAME:  David Goodman, David Goodman DOB:      11/20/1941      MR#:    [id number]     DATE OF RECORDING: 01/24/2022 REFERRING M.D.:  David Marker, MD Study Performed:   CPAP  Titration HISTORY:  [name], an 81 year-old male  [name], with significant hearing loss, who had presented with ( reported)  Vivid dreams and excessive daytime  sleepiness, as well as RLS. David Goodman is presenting with an  interesting sleep history, needs screening for RLS, narcolepsy  and apnea. He weaned off Paxil in order to have this sleep study  followed by an MSLT if no apnea is found. The patient returned today for in lab CPAP titration following his sleep study from 11-24-2021.  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  There was no Periodic Limb Movement Disorder (PLMD) Mild Snoring Few Central Sleep Apnea (13 central and 28 obstructive apneas and many hypopneas)  Dysfunctions associated with sleep stages or arousal from sleep PVCs were frequently seen in the single channel EKG.  RECOMMENDATIONS: Advise full night, attended, PAP titration study to optimize therapy. The patient has highly fragmented sleep which we will not recognize in an autotitration trial, as well as complex apnea, which makes him prone to develop treatment emergent central apnea.   The patient endorsed the Epworth Sleepiness Scale at 10 points.   The patient's weight 179 pounds with a height of 64 (inches), resulting in a BMI of 30.5 kg/m2. The patient's neck circumference measured 17 inches.  CURRENT MEDICATIONS: Tylenol, Vitamin C, Aspirin, Zyrtec, Plavix, Flonase, Xalatan, Cozaar, Magnesium, Toprol XL, Multivitamin, Naprosyn, Nitrostat, Protonix, Crestor, Cialis, Flomax, Aspercreme   PROCEDURE:  This is a multichannel digital polysomnogram utilizing the SomnoStar 11.2 system.  Electrodes and sensors were applied and monitored per AASM  Specifications.   EEG, EOG, Chin and Limb EMG, were sampled at 200 Hz.  ECG, Snore and Nasal Pressure, Thermal Airflow, Respiratory Effort, CPAP Flow and Pressure, Oximetry was sampled at 50 Hz. Digital video and audio were recorded.      CPAP was initiated under a [employer] FFM in size small-medium at 5 cmH20 with heated humidity per AASM standards and pressure was advanced to 9 cmH20and 2 cm EPR because of hypopneas, apneas, and desaturations.  At a PAP pressure of 9 cmH20 2 cm EPR, there was a reduction of the AHI to 0.6 with significant improvement of sleep apnea. Residual events were seen in REM sleep, no hypoxia was seen. The patient slept 213 minutes under this setting!   Lights Out was at 20:50 and Lights On at 04:59. Total recording time (TRT) was 489 minutes, with a total sleep time (TST) of 449 minutes. The patient's sleep latency was 11.5 minutes. REM latency was 56 minutes.   The sleep efficiency on CPAP was 91.8 %.    SLEEP ARCHITECTURE: WASO (Wake after sleep onset) was 37 minutes.  There were 38 minutes in Stage N1, 284.5 minutes Stage N2, 20 minutes Stage N3 and 106.5 minutes in Stage REM.  The percentage of Stage N1 was 8.5%, Stage N2 was 63.4%, Stage N3 was 4.5% and Stage R (REM sleep) was 23.7%.   RESPIRATORY ANALYSIS:  There was a total of 11 respiratory events: 0 obstructive apneas, 8 central apneas and 0 mixed apneas with a total of 8 apneas and 3 hypopneas.      The total APNEA/HYPOPNEA  INDEX (AHI) was 1.5 /hour .  8 events occurred in REM sleep and 3 events in NREM.  The REM AHI was 4.5 /hour versus a non-REM AHI of .5 /hour. REM sleep events were hypopneas and ( to my surprise) central events as well- The patient spent 0 minutes of total sleep time in the supine position and all 449 minutes in non-supine.  OXYGEN SATURATION & C02:  The baseline 02 saturation was 92%, with the lowest being 90%. Time spent below 89% saturation equaled 0 minutes. Snoring was present until  CPAP exceeded 8cm water.   PERIODIC LIMB MOVEMENTS:  The patient had a total of 41 Periodic Limb Movements. The Periodic Limb Movement (PLM) Arousal index was 1.3 /hour. There were very rare PLMs in REM sleep.  Audio and video analysis did not show any abnormal or unusual movements, behaviors, phonations or vocalizations.   EKG was in keeping with normal sinus rhythm (NSR). Post-study, the patient indicated that sleep was very good and restful.   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 An [employer] EVORA mask in small to medium size worked well for [name].  No Sleep Related Hypoxemia was seen.  Periodic Limb Movement Disorder was present, mild but significant enough due to some PLMs being seen in REM sleep.  Non-specific abnormal EKG, frequent PACs/PVCs.   PLANS/RECOMMENDATIONS: auto CPAP with 2 cm EPR, set between 5 and 10 cm water pressure, heated humidification and an EVORA mask s/m size. ResMed machine preferred.  Any apnea patient should avoid sedatives, hypnotics, and alcohol consumption at bed CPAP therapy compliance is defined as 4 hours or more of nightly use.    DISCUSSION: please start using your new machine 4 hours or more each night- if you can't for any reason let us and DME know about the problems you are having, do not wait until your next visit.   A follow up appointment will be scheduled in the Sleep Clinic at [facility].   Please call [phone] with any questions.      I certify that I have reviewed the entire raw data recording prior to the issuance of this report in accordance with the Standards of Accreditation of the American Academy of Sleep Medicine (AASM)    David Goodman, M.D. Diplomat, Tax adviser of Neurology  Diplomat, Tax adviser of Sleep Medicine Market researcher, [employer]  [facility] at [employer]

## 2022-01-28 NOTE — Addendum Note (Signed)
Addended by: Larey Seat on: 01/28/2022 01:10 PM   Modules accepted: Orders

## 2022-01-28 NOTE — Progress Notes (Signed)
Snoring was present until CPAP exceeded 8cm water.   PERIODIC LIMB MOVEMENTS:  The patient had a total of 41 Periodic Limb Movements. The Periodic Limb Movement (PLM) Arousal index was 1.3 /hour. There were very rare PLMs in REM sleep.  Audio and video analysis did not show any abnormal or unusual movements, behaviors, phonations or vocalizations.   EKG was in keeping with normal sinus rhythm (NSR). Post-study, the patient indicated that sleep was very good and restful.   DIAGNOSIS 1. 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 2. An [employer] EVORA mask in small to medium size worked well for David Goodman.  3. No Sleep Related Hypoxemia was seen.  4. Periodic Limb Movement Disorder was present, mild but significant enough due to some PLMs being seen in REM sleep.  5. Non-specific abnormal EKG, frequent PACs/PVCs.   PLANS/RECOMMENDATIONS: auto CPAP with 2 cm EPR, set between 5 and 10 cm water pressure, heated humidification and an EVORA mask s/m size. ResMed machine preferred.  1. Any apnea patient should avoid sedatives, hypnotics, and alcohol consumption at bed 2. CPAP therapy compliance is defined as 4 hours or more of nightly use.    DISCUSSION: please start using your new machine 4 hours or more each night- if you can't for any reason let us and DME know about the problems you are having, do not wait until your next visit.

## 2022-01-28 NOTE — Telephone Encounter (Signed)
-----   Message from Larey Seat, MD sent at 01/28/2022  1:10 PM EST ----- ?Snoring was present until CPAP exceeded 8cm water.  ?? ?PERIODIC LIMB MOVEMENTS:  The patient had a total of 41 Periodic Limb Movements. The Periodic Limb Movement (PLM) Arousal index was 1.3 /hour. There were very rare PLMs in REM sleep.  ?Audio and video analysis did not show any abnormal or unusual movements, behaviors, phonations or vocalizations.   ?EKG was in keeping with normal sinus rhythm (NSR). ?Post-study, the patient indicated that sleep was very good and restful.  ?? ?DIAGNOSIS ?1. 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 ?2. An [employer] EVORA mask in small to medium size worked well for [name].  ?3. No Sleep Related Hypoxemia was seen.  ?4. Periodic Limb Movement Disorder was present, mild but significant enough due to some PLMs being seen in REM sleep.  ?5. Non-specific abnormal EKG, frequent PACs/PVCs. ?? ?? ?PLANS/RECOMMENDATIONS: auto CPAP with 2 cm EPR, set between 5 and 10 cm water pressure, heated humidification and an EVORA mask s/m size. ResMed machine preferred.  ?1. Any apnea patient should avoid sedatives, hypnotics, and alcohol consumption at bed ?2. CPAP therapy compliance is defined as 4 hours or more of nightly use. ?? ?? ? DISCUSSION: please start using your new machine 4 hours or more each night- if you can't for any reason let us and DME know about the problems you are having, do not wait until your next visit. ?? ?

## 2022-01-28 NOTE — Telephone Encounter (Signed)
I called the pt and we discussed results of sleep study and recommendation from Dr. Brett Fairy. Pt reports he is leaving to go out of town to [place] for 3 weeks and would like to wait until he returns to being the process of starting his new machine. Pt reports he will call us back around 3/25-3/26 to f/u and being the process of sending orders for new autopap machine. Pt advised we would wait to hear back from him before proceeding.  ?

## 2022-03-02 DIAGNOSIS — H401111 Primary open-angle glaucoma, right eye, mild stage: Secondary | ICD-10-CM | POA: Diagnosis not present

## 2022-03-02 DIAGNOSIS — H401122 Primary open-angle glaucoma, left eye, moderate stage: Secondary | ICD-10-CM | POA: Diagnosis not present

## 2022-03-03 ENCOUNTER — Telehealth: Payer: Self-pay

## 2022-03-03 NOTE — Progress Notes (Signed)
? ? ?Chronic Care Management ?Pharmacy Assistant  ? ?Name: David Goodman  MRN: [id number] DOB: [birth year] ? ?Reason for Encounter:Hypertension and Heart Failure Disease State Call. ?  ?Recent office visits:  ?No recent office visit ? ?Recent consult visits:  ?01/24/2022 Dr. Brett Fairy MD (Neurology) No Medication Changes noted,  ?12/30/2021 Dr. Irish Lack MD (Cardiology) increase metoprolol 25 mg  once daily ?12/18/2021 Dr. Irish Lack MD (Cardiology) No Medication Changes noted, Follow up in 6 months ? ?Hospital visits:  ?None in previous 6 months ? ?Medications: ?Outpatient Encounter Medications as of 03/03/2022  ?Medication Sig Note  ? Ascorbic Acid (VITAMIN C) 1000 MG tablet Take 1,000 mg by mouth 2 (two) times a week. 07/24/2021: On hold  ? aspirin EC 81 MG tablet Take 81 mg by mouth at bedtime. Swallow whole.   ? cetirizine (ZYRTEC) 10 MG tablet Take 1 tablet (10 mg total) by mouth daily.   ? clopidogrel (PLAVIX) 75 MG tablet Take 1 tablet (75 mg total) by mouth daily. Take one tablet by mouth ( 75 mg) daily.   ? fluticasone (FLONASE) 50 MCG/ACT nasal spray Place 1 spray into both nostrils 2 (two) times daily.   ? latanoprost (XALATAN) 0.005 % ophthalmic solution Place 1 drop into both eyes at bedtime.   ? MAGNESIUM PO Take 400 mg by mouth at bedtime.   ? metoprolol succinate (TOPROL XL) 25 MG 24 hr tablet Take 1 tablet (25 mg total) by mouth daily.   ? Multiple Vitamin (MULTIVITAMIN WITH MINERALS) TABS tablet Take 1 tablet by mouth daily. 07/24/2021: On hold  ? naproxen (NAPROSYN) 500 MG tablet TAKE 1 TABLET BY MOUTH  TWICE DAILY WITH MEALS (Patient taking differently: Take 500 mg by mouth daily as needed.)   ? nitroGLYCERIN (NITROSTAT) 0.4 MG SL tablet Place 1 tablet (0.4 mg total) under the tongue every 5 (five) minutes as needed for chest pain.   ? pantoprazole (PROTONIX) 40 MG tablet Take 1 tablet (40 mg total) by mouth daily.   ? PARoxetine (PAXIL) 20 MG tablet Take 1 tablet (20 mg total) by mouth daily.   ? psyllium  (METAMUCIL) 58.6 % powder Take 1 packet by mouth 3 (three) times daily.   ? rosuvastatin (CRESTOR) 20 MG tablet Take 1 tablet (20 mg total) by mouth daily.   ? sacubitril-valsartan (ENTRESTO) 24-26 MG Take 1 tablet by mouth 2 (two) times daily.   ? tadalafil (CIALIS) 20 MG tablet Take 1 tablet (20 mg total) by mouth daily as needed for erectile dysfunction.   ? tamsulosin (FLOMAX) 0.4 MG CAPS capsule TAKE 1 CAPSULE BY MOUTH  DAILY   ? ?No facility-administered encounter medications on file as of 03/03/2022.  ? ?Care Gaps: ?Shingrix Vaccine ?COVID-19 Vaccine ? ?  ?Star Rating Drugs: ?Entresto 24-26 mg last filled 01/07/2022 30 day supply at [facility].  ?Rosuvastatin 20 mg last filled 12/31/2021 90 day supply at [facility]. ?  ?Medication Fill Gaps: ?None ID ? ?Reviewed chart prior to disease state call. Spoke with patient regarding BP ? ?Recent Office Vitals: ?BP Readings from Last 3 Encounters:  ?12/18/21 128/72  ?11/26/21 (!) 150/89  ?09/21/21 (!) 167/84  ? ?Pulse Readings from Last 3 Encounters:  ?12/18/21 73  ?11/26/21 70  ?09/21/21 75  ?  ?Wt Readings from Last 3 Encounters:  ?12/18/21 175 lb (79.4 kg)  ?09/21/21 179 lb 8 oz (81.4 kg)  ?09/08/21 179 lb 12.8 oz (81.6 kg)  ?  ? ?Kidney Function ?Lab Results  ?Component Value Date/Time  ?  CREATININE 0.96 12/18/2021 09:50 AM  ? CREATININE 1.16 12/09/2021 08:38 AM  ? GFR 81.92 07/10/2021 10:16 AM  ? GFRNONAA >60 06/04/2021 07:49 AM  ? GFRAA >60 07/18/2020 04:18 AM  ? ? ? ?  Latest Ref Rng & Units 12/18/2021  ?  9:50 AM 12/09/2021  ?  8:38 AM 10/14/2021  ?  9:56 AM  ?BMP  ?Glucose 70 - 99 mg/dL 85   89   91    ?BUN 8 - 27 mg/dL '21   18   25    '$ ?Creatinine 0.76 - 1.27 mg/dL 0.96   1.16   1.05    ?BUN/Creat Ratio 10 - '24 22   16   24    '$ ?Sodium 134 - 144 mmol/L 136   136   136    ?Potassium 3.5 - 5.2 mmol/L 5.2   4.7   4.9    ?Chloride 96 - 106 mmol/L 99   99   101    ?CO2 20 - 29 mmol/L '23   25   26    '$ ?Calcium 8.6 - 10.2 mg/dL 9.3   9.3   9.3    ? ? ?Current  antihypertensive regimen:  ?Metoprolol XL 25 mg 1 tablet daily ?Entresto 24-26 mg twice daily ? ? ?What recent interventions/DTPs have been made by any provider to improve Blood Pressure control since last CPP Visit:  ?12/30/2021 Dr. Irish Lack MD (Cardiology) increase metoprolol 25 mg  once daily ? ?Any recent hospitalizations or ED visits since last visit with CPP? No ? ?I have attempted without success to contact this patient by phone three times to do his hypertension and Heart Failure  Disease State call. I left a Voice message for patient to return my call. ? ? ?Telephone follow up appointment with care management team member scheduled for:  06/17/2022 at 3:00 PM ? ?Bessie Kellihan,CPA ?Clinical Pharmacist Assistant ?[phone]  ? ? ? ?Adherence Review: ?Is the patient currently on ACE/ARB medication? No ?Does the patient have >5 day gap between last estimated fill dates? No ? ? ? ?

## 2022-03-29 ENCOUNTER — Other Ambulatory Visit: Payer: Self-pay | Admitting: Interventional Cardiology

## 2022-04-07 ENCOUNTER — Telehealth: Payer: Self-pay | Admitting: Physician Assistant

## 2022-04-07 ENCOUNTER — Other Ambulatory Visit: Payer: Self-pay

## 2022-04-07 MED ORDER — ENTRESTO 24-26 MG PO TABS: 1.0000 | ORAL_TABLET | Freq: Two times a day (BID) | ORAL | 2 refills | Status: DC

## 2022-04-07 NOTE — Telephone Encounter (Signed)
?*  STAT* If patient is at the pharmacy, call can be transferred to refill team. ? ? ?1. Which medications need to be refilled? (please list name of each medication and dose if known) sacubitril-valsartan (ENTRESTO) 24-26 MG ? ?2. Which pharmacy/location (including street and city if local pharmacy) is medication to be sent to?  ?[facility] ([facility] ) - [place], [place] ? ?3. Do they need a 30 day or 90 day supply? 90 ? ?Pt called requesting pharmacy change due to price change.   ?

## 2022-04-07 NOTE — Telephone Encounter (Signed)
Pt's medication was sent to pt's pharmacy as requested. Confirmation received.  °

## 2022-04-11 ENCOUNTER — Inpatient Hospital Stay (HOSPITAL_COMMUNITY)
Admission: EM | Admit: 2022-04-11 | Discharge: 2022-04-16 | DRG: 377 | Disposition: A | Discharge status: Home / Self Care | Payer: Medicare Other | Attending: Internal Medicine | Admitting: Internal Medicine

## 2022-04-11 ENCOUNTER — Observation Stay (HOSPITAL_COMMUNITY): Payer: Medicare Other

## 2022-04-11 ENCOUNTER — Encounter (HOSPITAL_COMMUNITY): Payer: Self-pay

## 2022-04-11 DIAGNOSIS — G4733 Obstructive sleep apnea (adult) (pediatric): Secondary | ICD-10-CM | POA: Diagnosis present

## 2022-04-11 DIAGNOSIS — Z7902 Long term (current) use of antithrombotics/antiplatelets: Secondary | ICD-10-CM

## 2022-04-11 DIAGNOSIS — J309 Allergic rhinitis, unspecified: Secondary | ICD-10-CM | POA: Diagnosis not present

## 2022-04-11 DIAGNOSIS — K760 Fatty (change of) liver, not elsewhere classified: Secondary | ICD-10-CM | POA: Diagnosis present

## 2022-04-11 DIAGNOSIS — R933 Abnormal findings on diagnostic imaging of other parts of digestive tract: Secondary | ICD-10-CM | POA: Diagnosis not present

## 2022-04-11 DIAGNOSIS — H409 Unspecified glaucoma: Secondary | ICD-10-CM | POA: Diagnosis not present

## 2022-04-11 DIAGNOSIS — I10 Essential (primary) hypertension: Secondary | ICD-10-CM | POA: Diagnosis not present

## 2022-04-11 DIAGNOSIS — R55 Syncope and collapse: Secondary | ICD-10-CM | POA: Diagnosis present

## 2022-04-11 DIAGNOSIS — R079 Chest pain, unspecified: Secondary | ICD-10-CM | POA: Diagnosis not present

## 2022-04-11 DIAGNOSIS — I951 Orthostatic hypotension: Secondary | ICD-10-CM | POA: Diagnosis not present

## 2022-04-11 DIAGNOSIS — R578 Other shock: Secondary | ICD-10-CM | POA: Diagnosis present

## 2022-04-11 DIAGNOSIS — E861 Hypovolemia: Secondary | ICD-10-CM | POA: Diagnosis present

## 2022-04-11 DIAGNOSIS — I110 Hypertensive heart disease with heart failure: Secondary | ICD-10-CM | POA: Diagnosis not present

## 2022-04-11 DIAGNOSIS — N2889 Other specified disorders of kidney and ureter: Secondary | ICD-10-CM | POA: Diagnosis not present

## 2022-04-11 DIAGNOSIS — D62 Acute posthemorrhagic anemia: Secondary | ICD-10-CM | POA: Diagnosis present

## 2022-04-11 DIAGNOSIS — K625 Hemorrhage of anus and rectum: Secondary | ICD-10-CM | POA: Diagnosis not present

## 2022-04-11 DIAGNOSIS — D649 Anemia, unspecified: Secondary | ICD-10-CM | POA: Diagnosis not present

## 2022-04-11 DIAGNOSIS — K5791 Diverticulosis of intestine, part unspecified, without perforation or abscess with bleeding: Secondary | ICD-10-CM | POA: Diagnosis not present

## 2022-04-11 DIAGNOSIS — R3916 Straining to void: Secondary | ICD-10-CM | POA: Diagnosis not present

## 2022-04-11 DIAGNOSIS — N400 Enlarged prostate without lower urinary tract symptoms: Secondary | ICD-10-CM | POA: Diagnosis present

## 2022-04-11 DIAGNOSIS — K921 Melena: Secondary | ICD-10-CM | POA: Diagnosis not present

## 2022-04-11 DIAGNOSIS — K626 Ulcer of anus and rectum: Secondary | ICD-10-CM | POA: Diagnosis present

## 2022-04-11 DIAGNOSIS — R7302 Impaired glucose tolerance (oral): Secondary | ICD-10-CM | POA: Diagnosis not present

## 2022-04-11 DIAGNOSIS — K922 Gastrointestinal hemorrhage, unspecified: Secondary | ICD-10-CM | POA: Diagnosis not present

## 2022-04-11 DIAGNOSIS — Z79899 Other long term (current) drug therapy: Secondary | ICD-10-CM | POA: Diagnosis not present

## 2022-04-11 DIAGNOSIS — J8410 Pulmonary fibrosis, unspecified: Secondary | ICD-10-CM | POA: Diagnosis present

## 2022-04-11 DIAGNOSIS — I5020 Unspecified systolic (congestive) heart failure: Secondary | ICD-10-CM | POA: Diagnosis not present

## 2022-04-11 DIAGNOSIS — Z87442 Personal history of urinary calculi: Secondary | ICD-10-CM

## 2022-04-11 DIAGNOSIS — K648 Other hemorrhoids: Secondary | ICD-10-CM | POA: Diagnosis not present

## 2022-04-11 DIAGNOSIS — N281 Cyst of kidney, acquired: Secondary | ICD-10-CM | POA: Diagnosis not present

## 2022-04-11 DIAGNOSIS — H4010X0 Unspecified open-angle glaucoma, stage unspecified: Secondary | ICD-10-CM | POA: Diagnosis present

## 2022-04-11 DIAGNOSIS — I5022 Chronic systolic (congestive) heart failure: Secondary | ICD-10-CM | POA: Diagnosis present

## 2022-04-11 DIAGNOSIS — G8929 Other chronic pain: Secondary | ICD-10-CM | POA: Diagnosis not present

## 2022-04-11 DIAGNOSIS — K219 Gastro-esophageal reflux disease without esophagitis: Secondary | ICD-10-CM | POA: Diagnosis not present

## 2022-04-11 DIAGNOSIS — Z955 Presence of coronary angioplasty implant and graft: Secondary | ICD-10-CM | POA: Diagnosis not present

## 2022-04-11 DIAGNOSIS — R0789 Other chest pain: Secondary | ICD-10-CM | POA: Diagnosis not present

## 2022-04-11 DIAGNOSIS — K5731 Diverticulosis of large intestine without perforation or abscess with bleeding: Secondary | ICD-10-CM | POA: Diagnosis not present

## 2022-04-11 DIAGNOSIS — I25118 Atherosclerotic heart disease of native coronary artery with other forms of angina pectoris: Secondary | ICD-10-CM | POA: Diagnosis not present

## 2022-04-11 DIAGNOSIS — F419 Anxiety disorder, unspecified: Secondary | ICD-10-CM | POA: Diagnosis present

## 2022-04-11 DIAGNOSIS — N401 Enlarged prostate with lower urinary tract symptoms: Secondary | ICD-10-CM | POA: Diagnosis not present

## 2022-04-11 DIAGNOSIS — Z7982 Long term (current) use of aspirin: Secondary | ICD-10-CM

## 2022-04-11 DIAGNOSIS — I2510 Atherosclerotic heart disease of native coronary artery without angina pectoris: Secondary | ICD-10-CM | POA: Diagnosis not present

## 2022-04-11 DIAGNOSIS — E785 Hyperlipidemia, unspecified: Secondary | ICD-10-CM | POA: Diagnosis not present

## 2022-04-11 DIAGNOSIS — K529 Noninfective gastroenteritis and colitis, unspecified: Secondary | ICD-10-CM | POA: Diagnosis present

## 2022-04-11 DIAGNOSIS — I9589 Other hypotension: Secondary | ICD-10-CM | POA: Diagnosis not present

## 2022-04-11 DIAGNOSIS — E876 Hypokalemia: Secondary | ICD-10-CM | POA: Diagnosis not present

## 2022-04-11 DIAGNOSIS — K6289 Other specified diseases of anus and rectum: Secondary | ICD-10-CM | POA: Diagnosis not present

## 2022-04-11 DIAGNOSIS — K5730 Diverticulosis of large intestine without perforation or abscess without bleeding: Secondary | ICD-10-CM | POA: Diagnosis not present

## 2022-04-11 DIAGNOSIS — I493 Ventricular premature depolarization: Secondary | ICD-10-CM | POA: Diagnosis not present

## 2022-04-11 DIAGNOSIS — Z8711 Personal history of peptic ulcer disease: Secondary | ICD-10-CM

## 2022-04-11 LAB — LACTIC ACID, PLASMA
Lactic Acid, Venous: 1.2 mmol/L (ref 0.5–1.9)
Lactic Acid, Venous: 1.2 mmol/L (ref 0.5–1.9)

## 2022-04-11 LAB — TROPONIN I (HIGH SENSITIVITY)
Troponin I (High Sensitivity): 14 ng/L (ref <18)
Troponin I (High Sensitivity): 10 ng/L (ref <18)

## 2022-04-11 LAB — CBC WITH DIFFERENTIAL/PLATELET
Abs Immature Granulocytes: 0.03 10*3/uL (ref 0.00–0.07)
Basophils Absolute: 0 10*3/uL (ref 0.0–0.1)
Basophils Relative: 0 %
Eosinophils Absolute: 0.3 10*3/uL (ref 0.0–0.5)
Eosinophils Relative: 2 %
HCT: 37.2 % — ABNORMAL LOW (ref 39.0–52.0)
Hemoglobin: 12.9 g/dL — ABNORMAL LOW (ref 13.0–17.0)
Immature Granulocytes: 0 %
Lymphocytes Relative: 18 %
Lymphs Abs: 2.3 10*3/uL (ref 0.7–4.0)
MCH: 30.4 pg (ref 26.0–34.0)
MCHC: 34.7 g/dL (ref 30.0–36.0)
MCV: 87.5 fL (ref 80.0–100.0)
Monocytes Absolute: 0.9 10*3/uL (ref 0.1–1.0)
Monocytes Relative: 7 %
Neutro Abs: 9.7 10*3/uL — ABNORMAL HIGH (ref 1.7–7.7)
Neutrophils Relative %: 73 %
Platelets: 263 10*3/uL (ref 150–400)
RBC: 4.25 MIL/uL (ref 4.22–5.81)
RDW: 13.8 % (ref 11.5–15.5)
WBC: 13.3 10*3/uL — ABNORMAL HIGH (ref 4.0–10.5)
nRBC: 0 % (ref 0.0–0.2)

## 2022-04-11 LAB — COMPREHENSIVE METABOLIC PANEL
ALT: 20 U/L (ref 0–44)
AST: 24 U/L (ref 15–41)
Albumin: 4 g/dL (ref 3.5–5.0)
Alkaline Phosphatase: 45 U/L (ref 38–126)
Anion gap: 7 (ref 5–15)
BUN: 25 mg/dL — ABNORMAL HIGH (ref 8–23)
CO2: 22 mmol/L (ref 22–32)
Calcium: 8.8 mg/dL — ABNORMAL LOW (ref 8.9–10.3)
Chloride: 108 mmol/L (ref 98–111)
Creatinine, Ser: 1.13 mg/dL (ref 0.61–1.24)
GFR, Estimated: >60 mL/min (ref >60)
Glucose, Bld: 129 mg/dL — ABNORMAL HIGH (ref 70–99)
Potassium: 4.2 mmol/L (ref 3.5–5.1)
Sodium: 137 mmol/L (ref 135–145)
Total Bilirubin: 0.9 mg/dL (ref 0.3–1.2)
Total Protein: 6.6 g/dL (ref 6.5–8.1)

## 2022-04-11 LAB — HEMOGLOBIN AND HEMATOCRIT, BLOOD
HCT: 26.7 % — ABNORMAL LOW (ref 39.0–52.0)
HCT: 26.9 % — ABNORMAL LOW (ref 39.0–52.0)
Hemoglobin: 9.2 g/dL — ABNORMAL LOW (ref 13.0–17.0)
Hemoglobin: 9 g/dL — ABNORMAL LOW (ref 13.0–17.0)

## 2022-04-11 LAB — PREPARE RBC (CROSSMATCH)

## 2022-04-11 LAB — PROTIME-INR
INR: 1.2 (ref 0.8–1.2)
Prothrombin Time: 15.2 seconds (ref 11.4–15.2)

## 2022-04-11 LAB — APTT: aPTT: 28 seconds (ref 24–36)

## 2022-04-11 LAB — CBG MONITORING, ED: Glucose-Capillary: 144 mg/dL — ABNORMAL HIGH (ref 70–99)

## 2022-04-11 MED ORDER — SODIUM CHLORIDE 0.9 % IV SOLN
250.0000 mL | INTRAVENOUS | Status: DC
  Administered 2022-04-11 – 2022-04-13 (×2): 250 mL via INTRAVENOUS

## 2022-04-11 MED ORDER — SODIUM CHLORIDE 0.9 % IV BOLUS
250.0000 mL | INTRAVENOUS | Status: AC
  Administered 2022-04-11: 250 mL via INTRAVENOUS

## 2022-04-11 MED ORDER — SODIUM CHLORIDE 0.9 % IV SOLN: INTRAVENOUS | Status: DC

## 2022-04-11 MED ORDER — ACETAMINOPHEN 650 MG RE SUPP
650.0000 mg | Freq: Four times a day (QID) | RECTAL | Status: DC | PRN
Start: 2022-04-11 — End: 2022-04-16

## 2022-04-11 MED ORDER — NOREPINEPHRINE 4 MG/250ML-% IV SOLN
2.0000 ug/min | INTRAVENOUS | Status: DC
  Administered 2022-04-11: 2 ug/min via INTRAVENOUS
  Filled 2022-04-11: qty 250

## 2022-04-11 MED ORDER — SODIUM CHLORIDE 0.9 % IV BOLUS: 500.0000 mL | Freq: Once | INTRAVENOUS | Status: DC

## 2022-04-11 MED ORDER — PAROXETINE HCL 20 MG PO TABS: 20.0000 mg | ORAL_TABLET | Freq: Every day | ORAL | Status: DC

## 2022-04-11 MED ORDER — PANTOPRAZOLE SODIUM 40 MG IV SOLR
40.0000 mg | Freq: Two times a day (BID) | INTRAVENOUS | Status: DC
  Administered 2022-04-11 – 2022-04-16 (×10): 40 mg via INTRAVENOUS
  Filled 2022-04-11 (×10): qty 10

## 2022-04-11 MED ORDER — CHLORHEXIDINE GLUCONATE 0.12 % MT SOLN
15.0000 mL | Freq: Two times a day (BID) | OROMUCOSAL | Status: DC
  Administered 2022-04-12 – 2022-04-14 (×6): 15 mL via OROMUCOSAL
  Filled 2022-04-11 (×6): qty 15

## 2022-04-11 MED ORDER — ONDANSETRON HCL 4 MG PO TABS: 4.0000 mg | ORAL_TABLET | Freq: Four times a day (QID) | ORAL | Status: DC | PRN

## 2022-04-11 MED ORDER — ACETAMINOPHEN 325 MG PO TABS
650.0000 mg | ORAL_TABLET | Freq: Four times a day (QID) | ORAL | Status: DC | PRN
  Administered 2022-04-13 – 2022-04-15 (×4): 650 mg via ORAL
  Filled 2022-04-11 (×4): qty 2

## 2022-04-11 MED ORDER — IOHEXOL 350 MG/ML SOLN
100.0000 mL | Freq: Once | INTRAVENOUS | Status: AC | PRN
  Administered 2022-04-11: 100 mL via INTRAVENOUS

## 2022-04-11 MED ORDER — TAMSULOSIN HCL 0.4 MG PO CAPS: 0.4000 mg | ORAL_CAPSULE | Freq: Every day | ORAL | Status: DC

## 2022-04-11 MED ORDER — ROSUVASTATIN CALCIUM 20 MG PO TABS
20.0000 mg | ORAL_TABLET | Freq: Every day | ORAL | Status: DC
  Administered 2022-04-12 – 2022-04-16 (×5): 20 mg via ORAL
  Filled 2022-04-11 (×6): qty 1

## 2022-04-11 MED ORDER — NOREPINEPHRINE 4 MG/250ML-% IV SOLN: 0.0000 ug/min | INTRAVENOUS | Status: DC

## 2022-04-11 MED ORDER — ORAL CARE MOUTH RINSE
15.0000 mL | Freq: Two times a day (BID) | OROMUCOSAL | Status: DC
  Administered 2022-04-13 – 2022-04-14 (×4): 15 mL via OROMUCOSAL

## 2022-04-11 MED ORDER — ONDANSETRON HCL 4 MG/2ML IJ SOLN
4.0000 mg | Freq: Once | INTRAMUSCULAR | Status: AC
  Administered 2022-04-11: 4 mg via INTRAVENOUS
  Filled 2022-04-11: qty 2

## 2022-04-11 MED ORDER — LATANOPROST 0.005 % OP SOLN
1.0000 [drp] | Freq: Every day | OPHTHALMIC | Status: DC
  Administered 2022-04-12 – 2022-04-15 (×4): 1 [drp] via OPHTHALMIC
  Filled 2022-04-11 (×2): qty 2.5

## 2022-04-11 MED ORDER — WITCH HAZEL-GLYCERIN EX PADS
MEDICATED_PAD | CUTANEOUS | Status: DC | PRN
  Filled 2022-04-11: qty 100

## 2022-04-11 MED ORDER — SODIUM CHLORIDE 0.9% IV SOLUTION: Freq: Once | INTRAVENOUS | Status: AC

## 2022-04-11 MED ORDER — ONDANSETRON HCL 4 MG/2ML IJ SOLN: 4.0000 mg | Freq: Four times a day (QID) | INTRAMUSCULAR | Status: DC | PRN

## 2022-04-11 MED ORDER — SODIUM CHLORIDE 0.9 % IV BOLUS
1000.0000 mL | Freq: Once | INTRAVENOUS | Status: AC
  Administered 2022-04-11: 1000 mL via INTRAVENOUS

## 2022-04-11 NOTE — Consult Note (Addendum)
? ?NAME:  David Goodman, MRN:  [id number], DOB:  [birth year], LOS: 0 ?ADMISSION DATE:  04/11/2022, CONSULTATION DATE:  04/11/22 ?REFERRING MD:  Cherylann Ratel, DO CHIEF COMPLAINT:  GI Bleed  ? ?History of Present Illness:  ?81 year old male hx peptic ulcer disease, GERD, HTN, HLD, BPH and anxiety who presents with rectal bleeding. He has recently been taking Naproxex twice a day for chronic knee pain. Today he noted bloody stool associated with weakness. In the ED had an episode of syncope after blood BM. He was given IVF 1L and improved. [facility] admitted patient. ? ?Called by [facility] 5/14. Patient had orthostatic hypotension with SBP in the 70s and additional bloody BM x 3. GI consulted. CTA and H/H ordered. Given additional 1L IVF. PCCM consulted ? ?Pertinent  Medical History  ?CAD on Plavix and ASA, peptic ulcer disease, GERD, HTN, HLD, BPH and anxiety ? ?Significant Hospital Events: ?Including procedures, antibiotic start and stop dates in addition to other pertinent events   ?5/14 Initially admitted to [facility]. PCCM consulted for hemorrhagic shock ? ?Interim History / Subjective:  ?As above ? ?Objective   ?Blood pressure (!) 75/41, pulse (!) 41, temperature 97.7 ?F (36.5 ?C), temperature source Oral, resp. rate (!) 28, height '5\' 5"'$  (1.651 m), weight 76.9 kg, SpO2 100 %. ?   ?   ? ?Intake/Output Summary (Last 24 hours) at 04/11/2022 1747 ?Last data filed at 04/11/2022 1317 ?Gross per 24 hour  ?Intake 1000 ml  ?Output --  ?Net 1000 ml  ? ?Filed Weights  ? 04/11/22 1632  ?Weight: 76.9 kg  ? ?Physical Exam: ?General: Well-appearing, no acute distress ?HENT: [place], AT, OP clear, MMM ?Eyes: EOMI, no scleral icterus ?Respiratory: Clear to auscultation bilaterally.  No crackles, wheezing or rales ?Cardiovascular: RRR, -M/R/G, no JVD ?GI: BS+, soft, nontender ?Extremities:-Edema,-tenderness ?Neuro: AAO x4, CNII-XII grossly intact ?Skin: Intact, no rashes or bruising ?Psych: Normal mood, normal affect ? ? ?Resolved Hospital Problem list    ?N/A ? ?Assessment & Plan:  ?Hemorrhagic shock ?Acute blood loss anemia ?Syncope ?Transfer to ICU ?STAT 1 U PRBC ?Type screen/cross ?Trend CBC. Transfuse goal Hg >7 or active bleeding ?[facility] consulted. STAT CTA ordered ?NS 125 cc/h ?Low threshold to start vasopressor support ?PPI BID ?Obtain additional large bore IV access ?Trend LA ?NPO ? ?CAD ?Syncope ?Bradycardia ?HTN ?Chronic systolic heart failure ?Telemetry ?DC Plavix ?DC ASA ?Echocardiogram ? ?OSA ?CPAP ? ?Best Practice (right click and "Reselect all SmartList Selections" daily)  ? ?Diet/type: NPO ?DVT prophylaxis:  ?GI prophylaxis: PPI ?Lines: N/A ?Foley:  N/A ?Code Status:  full code ?Last date of multidisciplinary goals of care discussion [5/14] Discussed with TR[facility] ? ?Labs   ?CBC: ?Recent Labs  ?Lab 04/11/22 ?1224  ?WBC 13.3*  ?NEUTROABS 9.7*  ?HGB 12.9*  ?HCT 37.2*  ?MCV 87.5  ?PLT 263  ? ? ?Basic Metabolic Panel: ?Recent Labs  ?Lab 04/11/22 ?1224  ?NA 137  ?K 4.2  ?CL 108  ?CO2 22  ?GLUCOSE 129*  ?BUN 25*  ?CREATININE 1.13  ?CALCIUM 8.8*  ? ?GFR: ?Estimated Creatinine Clearance: 49.9 mL/min (by C-G formula based on SCr of 1.13 mg/dL). ?Recent Labs  ?Lab 04/11/22 ?1224  ?WBC 13.3*  ? ? ?Liver Function Tests: ?Recent Labs  ?Lab 04/11/22 ?1224  ?AST 24  ?ALT 20  ?ALKPHOS 45  ?BILITOT 0.9  ?PROT 6.6  ?ALBUMIN 4.0  ? ?No results for input(s): LIPASE, AMYLASE in the last 168 hours. ?No results for input(s): AMMONIA in the last 168 hours. ? ?  ABG ?No results found for: PHART, PCO2ART, PO2ART, HCO3, TCO2, ACIDBASEDEF, O2SAT  ? ?Coagulation Profile: ?Recent Labs  ?Lab 04/11/22 ?1224  ?INR 1.2  ? ? ?Cardiac Enzymes: ?No results for input(s): CKTOTAL, CKMB, CKMBINDEX, TROPONINI in the last 168 hours. ? ?HbA1C: ?Hgb A1c MFr Bld  ?Date/Time Value Ref Range Status  ?02/27/2021 09:50 AM 5.6 4.6 - 6.5 % Final  ?  Comment:  ?  Glycemic Control Guidelines for People with Diabetes:Non Diabetic:  <6%Goal of Therapy: <7%Additional Action Suggested:  >8%   ?07/17/2020 02:12  AM 5.2 4.8 - 5.6 % Final  ?  Comment:  ?  (NOTE) ?Pre diabetes:          5.7%-6.4% ? ?Diabetes:              >6.4% ? ?Glycemic control for   <7.0% ?adults with diabetes ?  ? ? ?CBG: ?Recent Labs  ?Lab 04/11/22 ?1211  ?GLUCAP 144*  ? ? ?Review of Systems:   ?Review of Systems  ?Constitutional:  Negative for chills, diaphoresis, fever, malaise/fatigue and weight loss.  ?HENT:  Negative for congestion, ear pain and sore throat.   ?Respiratory:  Negative for cough, hemoptysis, sputum production, shortness of breath and wheezing.   ?Cardiovascular:  Negative for chest pain, palpitations and leg swelling.  ?Gastrointestinal:  Positive for blood in stool. Negative for abdominal pain, heartburn and nausea.  ?Genitourinary:  Negative for frequency.  ?Musculoskeletal:  Positive for joint pain. Negative for myalgias.  ?Skin:  Negative for itching and rash.  ?Neurological:  Positive for dizziness and loss of consciousness. Negative for seizures, weakness and headaches.  ?Endo/Heme/Allergies:  Does not bruise/bleed easily.  ?Psychiatric/Behavioral:  Negative for depression. The patient is not nervous/anxious.   ? ? 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  ? ?Surgical History:  ? ?Past Surgical History:  ?Procedure Laterality Date  ? BROW LIFT Bilateral 11/10/2020  ? Procedure: BLEPHAROPLASTY;  Surgeon: [name], MD;  Location: [facility];  Service: Plastics;  Laterality: Bilateral;  1 hour  ? COLONOSCOPY WITH PROPOFOL N/A 02/17/2019  ? Procedure: COLONOSCOPY WITH PROPOFOL;  Surgeon: Ronnette Juniper, MD;  Location: [facility];  Service: Gastroenterology;  Laterality: N/A;  ? CORONARY STENT INTERVENTION N/A 08/25/2021  ? Procedure: CORONARY STENT INTERVENTION;  Surgeon: Jettie Booze, MD;  Location: [facility];  Service: Cardiovascular;  Laterality: N/A;  ? HYDROCELE EXCISION Right 05/15/2003  ? INTRAVASCULAR LITHOTRIPSY  08/25/2021  ? Procedure: INTRAVASCULAR LITHOTRIPSY;  Surgeon: Jettie Booze, MD;  Location: [facility];  Service: Cardiovascular;;  ? INTRAVASCULAR ULTRASOUND/IVUS N/A 08/25/2021  ? Procedure: Intravascular Ultrasound/IVUS;  Surgeon: Jettie Booze, MD;  Location: [facility];  Service: Cardiovascular;  Laterality: N/A;  ? IR ANGIOGRAM VISCERAL SELECTIVE  02/16/2019  ? IR ANGIOGRAM VISCERAL SELECTIVE  02/16/2019  ? IR ANGIOGRAM VISCERAL SELECTIVE  02/16/2019  ? IR US GUIDE VASC ACCESS RIGHT  02/16/2019  ? LEFT HEART CATH AND CORONARY ANGIOGRAPHY N/A 07/28/2021  ? Procedure: LEFT HEART CATH AND CORONARY ANGIOGRAPHY;  Surgeon: Jettie Booze, MD;  Location: [facility];  Service: Cardiovascular;  Laterality: N/A;  ? LEFT HEART CATH AND CORONARY  ANGIOGRAPHY N/A 08/25/2021  ? Procedure: LEFT HEART CATH AND CORONARY ANGIOGRAPHY;  Surgeon: Jettie Booze, MD;  Location: [facility];  Service: Cardiovascular;  Laterality: N/A;  ? ROTATOR CUFF REPAIR    ? SPERMATOCELECTOMY Right 05/15/2003  ?  ? ?Social History:  ? reports that he has never smoked. He has never used smokeless tobacco. He reports current alcohol use. He reports that he does not use drugs.  ? ?Family History:  ?His family history includes Cancer in his brother.  ? ?Allergies ?No Known Allergies  ? ?Home Medications  ?Prior to Admission medications   ?Medication Sig Start Date End Date Taking? Authorizing Provider  ?Ascorbic Acid (VITAMIN C) 1000 MG tablet Take 1,000 mg by mouth 2 (two) times a week.   Yes [provider]  ?aspirin EC 81 MG tablet Take 81 mg by  mouth at bedtime. Swallow whole.   Yes [provider]  ?cetirizine (ZYRTEC) 10 MG tablet Take 1 tablet (10 mg total) by mouth daily. ?Patient taking differently: Take 10 mg by mouth daily as needed fo

## 2022-04-11 NOTE — ED Triage Notes (Signed)
Pt arrived via POV with spouse. Endorses some rectal bleeding this morning. States hx of same. Hypotensive in triage. Pt stated he was becoming very dizzy and felt like he was going to pass out. While on the way to acute bed, pt had syncopal event, snoring respirations. Pt moved in bed, provider at bedside. Pt woke, able to answer questions.  ? ? ?

## 2022-04-11 NOTE — ED Notes (Signed)
ED TO INPATIENT HANDOFF REPORT ? ?ED Nurse Name and Phone #: Baxter Flattery, RN ? ?S ?Name/Age/Gender ?David Goodman ?81 y.o. ?male ?Room/Bed: RESA/RESA ? ?Code Status ?  Code Status: Prior ? ?Home/SNF/Other ?Home ?Patient oriented to: self, place, time, and situation ?Is this baseline? Yes  ? ?Triage Complete: Triage complete  ?Chief Complaint ?GIB (gastrointestinal bleeding) [K92.2] ? ?Triage Note ?Pt arrived via POV with spouse. Endorses some rectal bleeding this morning. States hx of same. Hypotensive in triage. Pt stated he was becoming very dizzy and felt like he was going to pass out. While on the way to acute bed, pt had syncopal event, snoring respirations. Pt moved in bed, provider at bedside. Pt woke, able to answer questions.  ? ?  ? ?Allergies ?No Known Allergies ? ?Level of Care/Admitting Diagnosis ?ED Disposition   ? ? ED Disposition  ?Admit  ? Condition  ?--  ? Comment  ?Hospital Area: [facility] [[id number]] ? Level of Care: Telemetry [5] ? Admit to tele based on following criteria: Complex arrhythmia (Bradycardia/Tachycardia) ? May place patient in observation at [facility] or [facility] if equivalent level of care is available:: No ? Covid Evaluation: Asymptomatic - no recent exposure (last 10 days) testing not required ? Diagnosis: GIB (gastrointestinal bleeding) [086761] ? Admitting Physician: Jonnie Finner [[id number]] ? Attending Physician: Jonnie Finner [[id number]] ?  ?  ? ?  ? ? ?B ?Medical/Surgery History ?Past Medical History:  ?Diagnosis Date  ? ALLERGIC RHINITIS 07/13/2007  ? Aorta dilated on Echo; Normal sized Aorta on CT   ? Chest CT 1/23: No thoracic aortic aneurysm; coronary calcifications; aortic atherosclerosis; improved aeration of lungs with persistent minimal reticular opacities  ? GERD (gastroesophageal reflux disease)   ? HYPERLIPIDEMIA 07/13/2007  ? HYPERTENSION 07/13/2007  ? Impaired glucose tolerance 09/27/2016  ? Iron deficiency anemia due to chronic blood loss  02/27/2021  ? 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 PEPTIC ULCER DISEASE 07/13/2007  ? 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 ?Past Surgical History:  ?Procedure Laterality Date  ? BROW LIFT Bilateral 11/10/2020  ? Procedure: BLEPHAROPLASTY;  Surgeon: [name], MD;  Location: [place];  Service: Plastics;  Laterality: Bilateral;  1 hour  ? COLONOSCOPY WITH PROPOFOL N/A 02/17/2019  ? Procedure: COLONOSCOPY WITH PROPOFOL;  Surgeon: Ronnette Juniper, MD;  [facility];  Service: Gastroenterology;  Laterality: N/A;  ? CORONARY STENT INTERVENTION N/A 08/25/2021  ? Procedure: CORONARY STENT INTERVENTION;  Surgeon: Jettie Booze, MD;  Location: [place] [facility];  Service: Cardiovascular;  Laterality: N/A;  ? HYDROCELE EXCISION Right 05/15/2003  ? INTRAVASCULAR LITHOTRIPSY  08/25/2021  ? Procedure: INTRAVASCULAR LITHOTRIPSY;  Surgeon: Jettie Booze, MD;  Location: [place] [facility];  Service: Cardiovascular;;  ? INTRAVASCULAR ULTRASOUND/IVUS N/A 08/25/2021  ? Procedure: Intravascular Ultrasound/IVUS;  Surgeon: Jettie Booze, MD;  Location: [place] [facility];  Service: Cardiovascular;  Laterality: N/A;  ? IR ANGIOGRAM VISCERAL SELECTIVE  02/16/2019  ? IR ANGIOGRAM VISCERAL SELECTIVE  02/16/2019  ? IR ANGIOGRAM VISCERAL SELECTIVE  02/16/2019  ? IR US GUIDE VASC ACCESS RIGHT  02/16/2019  ? LEFT HEART CATH AND CORONARY ANGIOGRAPHY N/A 07/28/2021  ? Procedure: LEFT HEART CATH AND CORONARY ANGIOGRAPHY;  Surgeon: Jettie Booze, MD;  Location: [place] [facility];  Service: Cardiovascular;  Laterality: N/A;  ? LEFT HEART CATH AND CORONARY ANGIOGRAPHY N/A 08/25/2021  ? Procedure: LEFT HEART CATH AND CORONARY ANGIOGRAPHY;  Surgeon: Jettie Booze, MD;  Location: [place] [facility];  Service: Cardiovascular;  Laterality: N/A;  ? ROTATOR CUFF REPAIR    ? SPERMATOCELECTOMY Right 05/15/2003  ?  ? ?A ?IV Location/Drains/Wounds ?Patient Lines/Drains/Airways Status   ? ? Active  Line/Drains/Airways   ? ? Name Placement date Placement time Site Days  ? Peripheral IV 04/11/22 20 G Left Wrist 04/11/22  1216  Wrist  less than 1  ? Peripheral IV 04/11/22 20 G Right Antecubital 04/11/22  1216  Antecubital  less than 1  ? Incision (Closed) 11/10/20 N/A 11/10/20  0849  -- 517  ? ?  ?  ? ?  ? ? ?Intake/Output Last 24 hours ? ?Intake/Output Summary (Last 24 hours) at 04/11/2022 1503 ?Last data filed at 04/11/2022 1317 ?Gross per 24 hour  ?Intake 1000 ml  ?Output --  ?Net 1000 ml  ? ? ?Labs/Imaging ?Results for orders placed or performed during the hospital encounter of 04/11/22 (from the past 48 hour(s))  ?CBG monitoring, ED     Status: Abnormal  ? Collection Time: 04/11/22 12:11 PM  ?Result Value Ref Range  ? Glucose-Capillary 144 (H) 70 - 99 mg/dL  ?  Comment: Glucose reference range applies only to samples taken after fasting for at least 8 hours.  ?Type and screen [place]     Status: None  ? Collection Time: 04/11/22 12:16 PM  ?Result Value Ref Range  ? ABO/RH(D) A POS   ? Antibody Screen NEG   ? Sample Expiration    ?  04/14/2022,2359 ?Performed at [facility], [place] [address], [place], [place] [address] ?  ?Comprehensive metabolic panel     Status: Abnormal  ? Collection Time: 04/11/22 12:24 PM  ?Result Value Ref Range  ? Sodium 137 135 - 145 mmol/L  ? Potassium 4.2 3.5 - 5.1 mmol/L  ? Chloride 108 98 - 111 mmol/L  ? CO2 22 22 - 32 mmol/L  ? Glucose, Bld 129 (H) 70 - 99 mg/dL  ?  Comment: Glucose reference range applies only to samples taken after fasting for at least 8 hours.  ? BUN 25 (H) 8 - 23 mg/dL  ? Creatinine, Ser 1.13 0.61 - 1.24 mg/dL  ? Calcium 8.8 (L) 8.9 - 10.3 mg/dL  ? Total Protein 6.6 6.5 - 8.1 g/dL  ? Albumin 4.0 3.5 - 5.0 g/dL  ? AST 24 15 - 41 U/L  ? ALT 20 0 - 44 U/L  ? Alkaline Phosphatase 45 38 - 126 U/L  ? Total Bilirubin 0.9 0.3 - 1.2 mg/dL  ? GFR, Estimated >60 >60 mL/min  ?  Comment: (NOTE) ?Calculated using the CKD-EPI  Creatinine Equation (2021) ?  ? Anion gap 7 5 - 15  ?  Comment: Performed at [facility], [place] [address], [place], [place] [address]  ?CBC with Differential     Status: Abnormal  ? Collection Time: 04/11/22 12:24 PM  ?Result Value Ref Range  ? WBC 13.3 (H) 4.0 - 10.5 K/uL  ? RBC 4.25 4.22 - 5.81 MIL/uL  ? Hemoglobin 12.9 (L) 13.0 - 17.0 g/dL  ? HCT 37.2 (L) 39.0 - 52.0 %  ? MCV 87.5 80.0 - 100.0 fL  ? MCH 30.4 26.0 - 34.0 pg  ? MCHC 34.7 30.0 - 36.0 g/dL  ? RDW 13.8 11.5 - 15.5 %  ? Platelets 263 150 - 400 K/uL  ? nRBC 0.0 0.0 - 0.2 %  ? Neutrophils Relative % 73 %  ? Neutro Abs  9.7 (H) 1.7 - 7.7 K/uL  ? Lymphocytes Relative 18 %  ? Lymphs Abs 2.3 0.7 - 4.0 K/uL  ? Monocytes Relative 7 %  ? Monocytes Absolute 0.9 0.1 - 1.0 K/uL  ? Eosinophils Relative 2 %  ? Eosinophils Absolute 0.3 0.0 - 0.5 K/uL  ? Basophils Relative 0 %  ? Basophils Absolute 0.0 0.0 - 0.1 K/uL  ? Immature Granulocytes 0 %  ? Abs Immature Granulocytes 0.03 0.00 - 0.07 K/uL  ?  Comment: Performed at [facility], [place] [address] [place], [place] [address]  ?Protime-INR     Status: None  ? Collection Time: 04/11/22 12:24 PM  ?Result Value Ref Range  ? Prothrombin Time 15.2 11.4 - 15.2 seconds  ? INR 1.2 0.8 - 1.2  ?  Comment: (NOTE) ?INR goal varies based on device and disease states. ?Performed at [facility], [place] [address] ?[place], [place] [address] ?  ?APTT     Status: None  ? Collection Time: 04/11/22 12:24 PM  ?Result Value Ref Range  ? aPTT 28 24 - 36 seconds  ?  Comment: Performed at [facility], [place] [address] [place],  [address]  ?Troponin I (High Sensitivity)     Status: None  ? Collection Time: 04/11/22 12:32 PM  ?Result Value Ref Range  ? Troponin I (High Sensitivity) 14 <18 ng/L  ?  Comment: (NOTE) ?Elevated high sensitivity troponin I (hsTnI) values and significant  ?changes across serial measurements may suggest ACS but many other  ?chronic and  acute conditions are known to elevate hsTnI results.  ?Refer to the "Links" section for chest pain algorithms and additional  ?guidance. ?Performed at [facility], [place] [address] ?[place],

## 2022-04-11 NOTE — Progress Notes (Signed)
Received a call from bedside RN regarding ongoing lower GI bleed with hematochezia.  Serial H&H q6h x 4 ordered, to transfuse with Hg <8.  ? ?Possible bleeding from hemorrhoids, will give tucks PRN. ? ?INR 1.2. ? ?Bps are soft.  NS 250 cc IV fluid bolus, to maintain MAP>65.  Continue to closely monitor vital signs. ?

## 2022-04-11 NOTE — Progress Notes (Addendum)
Patient said he felt like he was going to pass out, and had to lie back down in bed.  BP 100/53 after lying in bed for 3 minutes. C/o 5/10 dull pain in lower left chest, upper left abdomen.  Says, "Feels like gas." ? ?MD notified. ? ?Angie Fava, RN  ? ? 04/11/22 1713  ?Vitals  ?Patient Position (if appropriate) Orthostatic Vitals  ?Orthostatic Lying   ?BP- Lying 91/55  ?Orthostatic Sitting  ?BP- Sitting (!) 79/51  ? ? ?

## 2022-04-11 NOTE — Progress Notes (Signed)
Received order to place U/S guided IV d/t starting pressors on pt. Pt's RN said they were holding off for now and she would contct me if anything changes. ?

## 2022-04-11 NOTE — Progress Notes (Addendum)
Called for positive orthostatics and continued bloody stools. ? ?- bolus 1L NS ?- check stat H&H ?- check CTA GIB ?- move to SDU ?- PCCM consulted ?[facility] updated ? ?Mentation is good. Denies CP, palpitations. Reports lightheadedness and weakness. Will order 1 unit pRBCs. ? ?45 minutes critical care time. ?Jonnie Finner, DO ? ?

## 2022-04-11 NOTE — H&P (View-Only) (Signed)
Referring Provider:  EDP ?Primary Care Physician:  Haydee Salter, MD ?Primary Gastroenterologist:  Dr. Watt Climes ? ?Reason for Consultation: GI bleed ? ?HPI: David Goodman is a 81 y.o. male with past medical history of coronary artery disease, history of CHF, history of coronary artery disease with PCI in September 2022 currently on Plavix, history of pulmonary fibrosis, history of CVA, history of GI bleed in the past with bleeding scan showing bleeding from transverse and proximal descending colon followed by negative angiogram.  He also had colonoscopy on February 16, 2021 which showed evidence of recent bleeding and diverticulosis.  No active bleeding was found. ? ?He was found to have lethargy and had a syncopal episode while in the triage. ? ?Patient seen and examined at bedside.  Patient's wife at bedside.  Patient was doing fine until this morning when he started having rectal bleeding.  2-4 bowel movements so far.  Denies any associated abdominal pain, nausea or vomiting. ? 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 ? ?Past Medical History:  ?Diagnosis Date  ? ALLERGIC RHINITIS 07/13/2007  ? Aorta dilated on Echo; Normal sized Aorta on CT   ? Chest CT 1/23: No thoracic aortic aneurysm; coronary calcifications; aortic atherosclerosis; improved aeration of lungs with persistent minimal reticular opacities  ? GERD (gastroesophageal reflux disease)   ? HYPERLIPIDEMIA 07/13/2007  ? HYPERTENSION 07/13/2007  ? Impaired glucose tolerance 09/27/2016  ? Iron deficiency anemia due to chronic blood loss 02/27/2021  ? 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 PEPTIC ULCER DISEASE 07/13/2007  ? 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 ? ?Past Surgical History:  ?Procedure Laterality Date  ? BROW LIFT Bilateral 11/10/2020  ? Procedure: BLEPHAROPLASTY;  Surgeon: [name], MD;  Location: [place];  Service: Plastics;  Laterality: Bilateral;  1 hour  ? COLONOSCOPY WITH PROPOFOL N/A 02/17/2019  ?  Procedure: COLONOSCOPY WITH PROPOFOL;  Surgeon: Ronnette Juniper, MD;  [facility];  Service: Gastroenterology;  Laterality: N/A;  ? CORONARY STENT INTERVENTION N/A 08/25/2021  ? Procedure: CORONARY STENT INTERVENTION;  Surgeon: Jettie Booze, MD;  Location: [place] [facility];  Service: Cardiovascular;  Laterality: N/A;  ? HYDROCELE EXCISION Right 05/15/2003  ? INTRAVASCULAR LITHOTRIPSY  08/25/2021  ? Procedure: INTRAVASCULAR LITHOTRIPSY;  Surgeon: Jettie Booze, MD;  Location: [place] [facility];  Service: Cardiovascular;;  ? INTRAVASCULAR ULTRASOUND/IVUS N/A 08/25/2021  ? Procedure: Intravascular Ultrasound/IVUS;  Surgeon: Jettie Booze, MD;  Location: [place] [facility];  Service: Cardiovascular;  Laterality: N/A;  ? IR ANGIOGRAM VISCERAL SELECTIVE  02/16/2019  ? IR ANGIOGRAM VISCERAL SELECTIVE  02/16/2019  ? IR ANGIOGRAM VISCERAL SELECTIVE  02/16/2019  ? IR US GUIDE VASC ACCESS RIGHT  02/16/2019  ? LEFT HEART CATH AND CORONARY ANGIOGRAPHY N/A 07/28/2021  ? Procedure: LEFT HEART CATH AND CORONARY ANGIOGRAPHY;  Surgeon: Jettie Booze, MD;  Location: [place] [facility];  Service: Cardiovascular;  Laterality: N/A;  ? LEFT HEART CATH AND CORONARY ANGIOGRAPHY N/A 08/25/2021  ? Procedure: LEFT HEART CATH AND CORONARY ANGIOGRAPHY;  Surgeon: Jettie Booze, MD;  Location: [place] [facility];  Service: Cardiovascular;  Laterality: N/A;  ? ROTATOR CUFF REPAIR    ? SPERMATOCELECTOMY Right 05/15/2003  ? ? ?Prior to Admission medications   ?Medication Sig Start Date End Date Taking? Authorizing Provider  ?Ascorbic Acid (VITAMIN C) 1000 MG tablet Take 1,000 mg by mouth 2 (two) times a week.    [provider]  ?aspirin EC 81  MG tablet Take 81 mg by mouth at bedtime. Swallow whole.    [provider]  ?cetirizine (ZYRTEC) 10 MG tablet Take 1 tablet (10 mg total) by mouth daily. 07/10/21   Haydee Salter, MD  ?clopidogrel (PLAVIX) 75 MG tablet Take 1 tablet (75 mg total) by mouth  daily. Take one tablet by mouth ( 75 mg) daily. 09/08/21 09/08/22  Richardson Dopp T, PA-C  ?fluticasone (FLONASE) 50 MCG/ACT nasal spray Place 1 spray into both nostrils 2 (two) times daily. 12/11/21   Haydee Salter, MD  ?latanoprost (XALATAN) 0.005 % ophthalmic solution Place 1 drop into both eyes at bedtime. 06/20/17   Marletta Lor, MD  ?MAGNESIUM PO Take 400 mg by mouth at bedtime.    [provider]  ?metoprolol succinate (TOPROL XL) 25 MG 24 hr tablet Take 1 tablet (25 mg total) by mouth daily. 01/01/22 01/01/23  Richardson Dopp T, PA-C  ?Multiple Vitamin (MULTIVITAMIN WITH MINERALS) TABS tablet Take 1 tablet by mouth daily.    [provider]  ?naproxen (NAPROSYN) 500 MG tablet TAKE 1 TABLET BY MOUTH  TWICE DAILY WITH MEALS ?Patient taking differently: Take 500 mg by mouth daily as needed. 10/30/21   Isaac Bliss, Rayford Halsted, MD  ?nitroGLYCERIN (NITROSTAT) 0.4 MG SL tablet Place 1 tablet (0.4 mg total) under the tongue every 5 (five) minutes as needed for chest pain. 07/22/21   Jettie Booze, MD  ?pantoprazole (PROTONIX) 40 MG tablet TAKE 1 TABLET BY MOUTH ONCE DAILY --PATIENT  TO  STOP  OMEPRAZOLE 03/29/22   Jettie Booze, MD  ?PARoxetine (PAXIL) 20 MG tablet Take 1 tablet (20 mg total) by mouth daily. 12/10/21   Haydee Salter, MD  ?psyllium (METAMUCIL) 58.6 % powder Take 1 packet by mouth 3 (three) times daily.    [provider]  ?rosuvastatin (CRESTOR) 20 MG tablet Take 1 tablet (20 mg total) by mouth daily. 08/25/21 08/25/22  Cheryln Manly, NP  ?sacubitril-valsartan (ENTRESTO) 24-26 MG Take 1 tablet by mouth 2 (two) times daily. 04/07/22   Jettie Booze, MD  ?tadalafil (CIALIS) 20 MG tablet Take 1 tablet (20 mg total) by mouth daily as needed for erectile dysfunction. 03/06/20   Isaac Bliss, Rayford Halsted, MD  ?tamsulosin (FLOMAX) 0.4 MG CAPS capsule TAKE 1 CAPSULE BY MOUTH  DAILY 10/30/21   Isaac Bliss, Rayford Halsted, MD  ? ? ?Scheduled Meds: ?Continuous  Infusions: ? sodium chloride 125 mL/hr at 04/11/22 1300  ? ?PRN Meds:. ? ?Allergies as of 04/11/2022  ? (No Known Allergies)  ? ? ?Family History  ?Problem Relation Age of Onset  ? Cancer Brother   ? ? ?Social History  ? ?Socioeconomic History  ? Marital status: Married  ?  Spouse name: Not on file  ? Number of children: Not on file  ? Years of education: Not on file  ? Highest education level: Not on file  ?Occupational History  ? Not on file  ?Tobacco Use  ? Smoking status: Never  ? Smokeless tobacco: Never  ?Vaping Use  ? Vaping Use: Never used  ?Substance and Sexual Activity  ? Alcohol use: Yes  ?  Comment: couple times a week - wine  ? Drug use: No  ? Sexual activity: Not Currently  ?Other Topics Concern  ? Not on file  ?Social History Narrative  ? Lives alone  ? Right handed  ? Caffeine: 1 cup of coffee a day  ? ?Social Determinants of Health  ? ?  Financial Resource Strain: Low Risk   ? Difficulty of Paying Living Expenses: Not hard at all  ?Food Insecurity: Not on file  ?Transportation Needs: Not on file  ?Physical Activity: Not on file  ?Stress: Not on file  ?Social Connections: Not on file  ?Intimate Partner Violence: Not on file  ? ? ?Review of Systems: All negative except as stated above in HPI. ? ?Physical Exam: ?Vital signs: ?Vitals:  ? 04/11/22 1330 04/11/22 1347  ?BP: (!) 94/56 106/65  ?Pulse: (!) 51 (!) 55  ?Resp: 13 16  ?Temp:    ?SpO2: 96% 96%  ? ?  ?General:   Alert,  Well-developed, well-nourished, pleasant and cooperative in NAD ?Lungs:  Clear throughout to auscultation.   No wheezes, crackles, or rhonchi. No acute distress. ?Heart:  Regular rate and rhythm; no murmurs, clicks, rubs,  or gallops. ?Abdomen: Soft, nontender, nondistended, bowel sounds present.  No peritoneal signs ?Rectal:  Deferred ? ?GI:  ?Lab Results: ?Recent Labs  ?  04/11/22 ?3267  ?WBC 13.3*  ?HGB 12.9*  ?HCT 37.2*  ?PLT 263  ? ?BMET ?Recent Labs  ?  04/11/22 ?1245  ?NA 137  ?K 4.2  ?CL 108  ?CO2 22  ?GLUCOSE 129*  ?BUN 25*   ?CREATININE 1.13  ?CALCIUM 8.8*  ? ?LFT ?Recent Labs  ?  04/11/22 ?8099  ?PROT 6.6  ?ALBUMIN 4.0  ?AST 24  ?ALT 20  ?ALKPHOS 45  ?BILITOT 0.9  ? ?PT/INR ?Recent Labs  ?  04/11/22 ?8338  ?LABPROT 15.2  ?

## 2022-04-11 NOTE — Progress Notes (Signed)
Patient arrived to room 1445.  Patient denies pain, c/o of some lightheadedness.  VS checked, telemetry begun. ? ?Angie Fava, RN  ?

## 2022-04-11 NOTE — Progress Notes (Signed)
Pt refused cpap tonight. He said he doesn't wear anything at home and do fine without it. Told pt to let RN know if he changed his mind.  ?

## 2022-04-11 NOTE — Consult Note (Signed)
Referring Provider:  EDP ?Primary Care Physician:  Haydee Salter, MD ?Primary Gastroenterologist:  Dr. Watt Climes ? ?Reason for Consultation: GI bleed ? ?HPI: David Goodman is a 81 y.o. male with past medical history of coronary artery disease, history of CHF, history of coronary artery disease with PCI in September 2022 currently on Plavix, history of pulmonary fibrosis, history of CVA, history of GI bleed in the past with bleeding scan showing bleeding from transverse and proximal descending colon followed by negative angiogram.  He also had colonoscopy on February 16, 2021 which showed evidence of recent bleeding and diverticulosis.  No active bleeding was found. ? ?He was found to have lethargy and had a syncopal episode while in the triage. ? ?Patient seen and examined at bedside.  Patient's wife at bedside.  Patient was doing fine until this morning when he started having rectal bleeding.  2-4 bowel movements so far.  Denies any associated abdominal pain, nausea or vomiting. ? 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 ? ?Past Medical History:  ?Diagnosis Date  ? ALLERGIC RHINITIS 07/13/2007  ? Aorta dilated on Echo; Normal sized Aorta on CT   ? Chest CT 1/23: No thoracic aortic aneurysm; coronary calcifications; aortic atherosclerosis; improved aeration of lungs with persistent minimal reticular opacities  ? GERD (gastroesophageal reflux disease)   ? HYPERLIPIDEMIA 07/13/2007  ? HYPERTENSION 07/13/2007  ? Impaired glucose tolerance 09/27/2016  ? Iron deficiency anemia due to chronic blood loss 02/27/2021  ? 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 PEPTIC ULCER DISEASE 07/13/2007  ? 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 ? ?Past Surgical History:  ?Procedure Laterality Date  ? BROW LIFT Bilateral 11/10/2020  ? Procedure: BLEPHAROPLASTY;  Surgeon: [name], MD;  Location: [facility];  Service: Plastics;  Laterality: Bilateral;  1 hour  ? COLONOSCOPY WITH PROPOFOL N/A 02/17/2019  ?  Procedure: COLONOSCOPY WITH PROPOFOL;  Surgeon: Ronnette Juniper, MD;  Location: [facility];  Service: Gastroenterology;  Laterality: N/A;  ? CORONARY STENT INTERVENTION N/A 08/25/2021  ? Procedure: CORONARY STENT INTERVENTION;  Surgeon: Jettie Booze, MD;  Location: [facility];  Service: Cardiovascular;  Laterality: N/A;  ? HYDROCELE EXCISION Right 05/15/2003  ? INTRAVASCULAR LITHOTRIPSY  08/25/2021  ? Procedure: INTRAVASCULAR LITHOTRIPSY;  Surgeon: Jettie Booze, MD;  Location: [facility];  Service: Cardiovascular;;  ? INTRAVASCULAR ULTRASOUND/IVUS N/A 08/25/2021  ? Procedure: Intravascular Ultrasound/IVUS;  Surgeon: Jettie Booze, MD;  Location: [facility];  Service: Cardiovascular;  Laterality: N/A;  ? IR ANGIOGRAM VISCERAL SELECTIVE  02/16/2019  ? IR ANGIOGRAM VISCERAL SELECTIVE  02/16/2019  ? IR ANGIOGRAM VISCERAL SELECTIVE  02/16/2019  ? IR US GUIDE VASC ACCESS RIGHT  02/16/2019  ? LEFT HEART CATH AND CORONARY ANGIOGRAPHY N/A 07/28/2021  ? Procedure: LEFT HEART CATH AND CORONARY ANGIOGRAPHY;  Surgeon: Jettie Booze, MD;  Location: [facility];  Service: Cardiovascular;  Laterality: N/A;  ? LEFT HEART CATH AND CORONARY ANGIOGRAPHY N/A 08/25/2021  ? Procedure: LEFT HEART CATH AND CORONARY ANGIOGRAPHY;  Surgeon: Jettie Booze, MD;  Location: [facility];  Service: Cardiovascular;  Laterality: N/A;  ? ROTATOR CUFF REPAIR    ? SPERMATOCELECTOMY Right 05/15/2003  ? ? ?Prior to Admission medications   ?Medication Sig Start Date End Date Taking? Authorizing Provider  ?Ascorbic Acid (VITAMIN C) 1000 MG tablet Take 1,000 mg by mouth 2 (two) times a week.    [provider]  ?aspirin EC 81  MG tablet Take 81 mg by mouth at bedtime. Swallow whole.    [provider]  ?cetirizine (ZYRTEC) 10 MG tablet Take 1 tablet (10 mg total) by mouth daily. 07/10/21   Haydee Salter, MD  ?clopidogrel (PLAVIX) 75 MG tablet Take 1 tablet (75 mg total) by mouth  daily. Take one tablet by mouth ( 75 mg) daily. 09/08/21 09/08/22  Richardson Dopp T, PA-C  ?fluticasone (FLONASE) 50 MCG/ACT nasal spray Place 1 spray into both nostrils 2 (two) times daily. 12/11/21   Haydee Salter, MD  ?latanoprost (XALATAN) 0.005 % ophthalmic solution Place 1 drop into both eyes at bedtime. 06/20/17   Marletta Lor, MD  ?MAGNESIUM PO Take 400 mg by mouth at bedtime.    [provider]  ?metoprolol succinate (TOPROL XL) 25 MG 24 hr tablet Take 1 tablet (25 mg total) by mouth daily. 01/01/22 01/01/23  Richardson Dopp T, PA-C  ?Multiple Vitamin (MULTIVITAMIN WITH MINERALS) TABS tablet Take 1 tablet by mouth daily.    [provider]  ?naproxen (NAPROSYN) 500 MG tablet TAKE 1 TABLET BY MOUTH  TWICE DAILY WITH MEALS ?Patient taking differently: Take 500 mg by mouth daily as needed. 10/30/21   Isaac Bliss, Rayford Halsted, MD  ?nitroGLYCERIN (NITROSTAT) 0.4 MG SL tablet Place 1 tablet (0.4 mg total) under the tongue every 5 (five) minutes as needed for chest pain. 07/22/21   Jettie Booze, MD  ?pantoprazole (PROTONIX) 40 MG tablet TAKE 1 TABLET BY MOUTH ONCE DAILY --PATIENT  TO  STOP  OMEPRAZOLE 03/29/22   Jettie Booze, MD  ?PARoxetine (PAXIL) 20 MG tablet Take 1 tablet (20 mg total) by mouth daily. 12/10/21   Haydee Salter, MD  ?psyllium (METAMUCIL) 58.6 % powder Take 1 packet by mouth 3 (three) times daily.    [provider]  ?rosuvastatin (CRESTOR) 20 MG tablet Take 1 tablet (20 mg total) by mouth daily. 08/25/21 08/25/22  Cheryln Manly, NP  ?sacubitril-valsartan (ENTRESTO) 24-26 MG Take 1 tablet by mouth 2 (two) times daily. 04/07/22   Jettie Booze, MD  ?tadalafil (CIALIS) 20 MG tablet Take 1 tablet (20 mg total) by mouth daily as needed for erectile dysfunction. 03/06/20   Isaac Bliss, Rayford Halsted, MD  ?tamsulosin (FLOMAX) 0.4 MG CAPS capsule TAKE 1 CAPSULE BY MOUTH  DAILY 10/30/21   Isaac Bliss, Rayford Halsted, MD  ? ? ?Scheduled Meds: ?Continuous  Infusions: ? sodium chloride 125 mL/hr at 04/11/22 1300  ? ?PRN Meds:. ? ?Allergies as of 04/11/2022  ? (No Known Allergies)  ? ? ?Family History  ?Problem Relation Age of Onset  ? Cancer Brother   ? ? ?Social History  ? ?Socioeconomic History  ? Marital status: Married  ?  Spouse name: Not on file  ? Number of children: Not on file  ? Years of education: Not on file  ? Highest education level: Not on file  ?Occupational History  ? Not on file  ?Tobacco Use  ? Smoking status: Never  ? Smokeless tobacco: Never  ?Vaping Use  ? Vaping Use: Never used  ?Substance and Sexual Activity  ? Alcohol use: Yes  ?  Comment: couple times a week - wine  ? Drug use: No  ? Sexual activity: Not Currently  ?Other Topics Concern  ? Not on file  ?Social History Narrative  ? Lives alone  ? Right handed  ? Caffeine: 1 cup of coffee a day  ? ?Social Determinants of Health  ? ?  Financial Resource Strain: Low Risk   ? Difficulty of Paying Living Expenses: Not hard at all  ?Food Insecurity: Not on file  ?Transportation Needs: Not on file  ?Physical Activity: Not on file  ?Stress: Not on file  ?Social Connections: Not on file  ?Intimate Partner Violence: Not on file  ? ? ?Review of Systems: All negative except as stated above in HPI. ? ?Physical Exam: ?Vital signs: ?Vitals:  ? 04/11/22 1330 04/11/22 1347  ?BP: (!) 94/56 106/65  ?Pulse: (!) 51 (!) 55  ?Resp: 13 16  ?Temp:    ?SpO2: 96% 96%  ? ?  ?General:   Alert,  Well-developed, well-nourished, pleasant and cooperative in NAD ?Lungs:  Clear throughout to auscultation.   No wheezes, crackles, or rhonchi. No acute distress. ?Heart:  Regular rate and rhythm; no murmurs, clicks, rubs,  or gallops. ?Abdomen: Soft, nontender, nondistended, bowel sounds present.  No peritoneal signs ?Rectal:  Deferred ? ?GI:  ?Lab Results: ?Recent Labs  ?  04/11/22 ?1194  ?WBC 13.3*  ?HGB 12.9*  ?HCT 37.2*  ?PLT 263  ? ?BMET ?Recent Labs  ?  04/11/22 ?1740  ?NA 137  ?K 4.2  ?CL 108  ?CO2 22  ?GLUCOSE 129*  ?BUN 25*   ?CREATININE 1.13  ?CALCIUM 8.8*  ? ?LFT ?Recent Labs  ?  04/11/22 ?8144  ?PROT 6.6  ?ALBUMIN 4.0  ?AST 24  ?ALT 20  ?ALKPHOS 45  ?BILITOT 0.9  ? ?PT/INR ?Recent Labs  ?  04/11/22 ?8185  ?LABPROT 15.2  ?

## 2022-04-11 NOTE — H&P (Signed)
?History and Physical  ? ? ?Patient: David Goodman WLN:[id number] DOB: 1941-02-27 ?DOA: 04/11/2022 ?DOS: the patient was seen and examined on 04/11/2022 ?PCP: Haydee Salter, MD  ?Patient coming from: Home ? ?Chief Complaint:  ?Chief Complaint  ?Patient presents with  ? Rectal Bleeding  ? Hypotension  ? ?HPI: David Goodman is a 81 y.o. male with medical history significant of gluacoma, GERD, HTN, HLD, BPH, anxiety. Presenting with rectal bleeding. He reports that he was in his normal state of health until this morning. He was trying to get to the bathroom and he had an episode of bowel incontinence. He saw blood in his stool. He felt weak. He didn't have any chest pain or palpitations. He became concerned and came to the ED for evaluation.  ? ?Review of Systems: As mentioned in the history of present illness. All other systems reviewed and are negative. ?Past Medical History:  ?Diagnosis Date  ? ALLERGIC RHINITIS 07/13/2007  ? Aorta dilated on Echo; Normal sized Aorta on CT   ? Chest CT 1/23: No thoracic aortic aneurysm; coronary calcifications; aortic atherosclerosis; improved aeration of lungs with persistent minimal reticular opacities  ? GERD (gastroesophageal reflux disease)   ? HYPERLIPIDEMIA 07/13/2007  ? HYPERTENSION 07/13/2007  ? Impaired glucose tolerance 09/27/2016  ? Iron deficiency anemia due to chronic blood loss 02/27/2021  ? 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 PEPTIC ULCER DISEASE 07/13/2007  ? 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 ?Past Surgical History:  ?Procedure Laterality Date  ? BROW LIFT Bilateral 11/10/2020  ? Procedure: BLEPHAROPLASTY;  Surgeon: [name], MD;  Location: [facility];  Service: Plastics;  Laterality: Bilateral;  1 hour  ? COLONOSCOPY WITH PROPOFOL N/A 02/17/2019  ? Procedure: COLONOSCOPY WITH PROPOFOL;  Surgeon: Ronnette Juniper, MD;  Location[facility];  Service: Gastroenterology;  Laterality: N/A;  ? CORONARY STENT INTERVENTION N/A 08/25/2021  ?  Procedure: CORONARY STENT INTERVENTION;  Surgeon: Jettie Booze, MD;  Location: [facility];  Service: Cardiovascular;  Laterality: N/A;  ? HYDROCELE EXCISION Right 05/15/2003  ? INTRAVASCULAR LITHOTRIPSY  08/25/2021  ? Procedure: INTRAVASCULAR LITHOTRIPSY;  Surgeon: Jettie Booze, MD;  Location: [facility];  Service: Cardiovascular;;  ? INTRAVASCULAR ULTRASOUND/IVUS N/A 08/25/2021  ? Procedure: Intravascular Ultrasound/IVUS;  Surgeon: Jettie Booze, MD;  Location: [facility];  Service: Cardiovascular;  Laterality: N/A;  ? IR ANGIOGRAM VISCERAL SELECTIVE  02/16/2019  ? IR ANGIOGRAM VISCERAL SELECTIVE  02/16/2019  ? IR ANGIOGRAM VISCERAL SELECTIVE  02/16/2019  ? IR US GUIDE VASC ACCESS RIGHT  02/16/2019  ? LEFT HEART CATH AND CORONARY ANGIOGRAPHY N/A 07/28/2021  ? Procedure: LEFT HEART CATH AND CORONARY ANGIOGRAPHY;  Surgeon: Jettie Booze, MD;  Location: [facility];  Service: Cardiovascular;  Laterality: N/A;  ? LEFT HEART CATH AND CORONARY ANGIOGRAPHY N/A 08/25/2021  ? Procedure: LEFT HEART CATH AND CORONARY ANGIOGRAPHY;  Surgeon: Jettie Booze, MD;  Location: [facility];  Service: Cardiovascular;  Laterality: N/A;  ? ROTATOR CUFF REPAIR    ? SPERMATOCELECTOMY Right 05/15/2003  ? ?Social History:  reports that he has never smoked. He has never used smokeless tobacco. He reports current alcohol use. He reports that he does not use drugs. ? ?No Known Allergies ? ?Family History  ?Problem Relation Age of Onset  ? Cancer Brother   ? ? ?Prior to Admission medications   ?Medication Sig Start Date End Date Taking? Authorizing Provider  ?Ascorbic Acid (VITAMIN C) 1000  MG tablet Take 1,000 mg by mouth 2 (two) times a week.   Yes [provider]  ?aspirin EC 81 MG tablet Take 81 mg by mouth at bedtime. Swallow whole.   Yes [provider]  ?cetirizine (ZYRTEC) 10 MG tablet Take 1 tablet (10 mg total) by mouth daily. ?Patient taking  differently: Take 10 mg by mouth daily as needed for allergies. 07/10/21  Yes Haydee Salter, MD  ?clopidogrel (PLAVIX) 75 MG tablet Take 1 tablet (75 mg total) by mouth daily. Take one tablet by mouth ( 75 mg) daily. 09/08/21 09/08/22 Yes Weaver, Scott T, PA-C  ?fluticasone (FLONASE) 50 MCG/ACT nasal spray Place 1 spray into both nostrils 2 (two) times daily. ?Patient taking differently: Place 2 sprays into both nostrils 2 (two) times daily. 12/11/21  Yes Haydee Salter, MD  ?latanoprost (XALATAN) 0.005 % ophthalmic solution Place 1 drop into both eyes at bedtime. 06/20/17  Yes Marletta Lor, MD  ?MAGNESIUM PO Take 400 mg by mouth at bedtime.   Yes [provider]  ?metoprolol succinate (TOPROL XL) 25 MG 24 hr tablet Take 1 tablet (25 mg total) by mouth daily. 01/01/22 01/01/23 Yes Richardson Dopp T, PA-C  ?Multiple Vitamin (MULTIVITAMIN WITH MINERALS) TABS tablet Take 1 tablet by mouth daily.   Yes [provider]  ?naproxen (NAPROSYN) 500 MG tablet TAKE 1 TABLET BY MOUTH  TWICE DAILY WITH MEALS ?Patient taking differently: Take 500 mg by mouth daily. 10/30/21  Yes Isaac Bliss, Rayford Halsted, MD  ?nitroGLYCERIN (NITROSTAT) 0.4 MG SL tablet Place 1 tablet (0.4 mg total) under the tongue every 5 (five) minutes as needed for chest pain. 07/22/21  Yes Jettie Booze, MD  ?pantoprazole (PROTONIX) 40 MG tablet TAKE 1 TABLET BY MOUTH ONCE DAILY --PATIENT  TO  STOP  OMEPRAZOLE ?Patient taking differently: Take 40 mg by mouth daily. 03/29/22  Yes Jettie Booze, MD  ?Polyvinyl Alcohol-Povidone (REFRESH OP) Place 1 drop into the left eye 2 (two) times daily as needed (dryness).   Yes [provider]  ?psyllium (METAMUCIL) 58.6 % powder Take 1 packet by mouth daily.   Yes [provider]  ?rosuvastatin (CRESTOR) 20 MG tablet Take 1 tablet (20 mg total) by mouth daily. 08/25/21 08/25/22 Yes Cheryln Manly, NP  ?sacubitril-valsartan (ENTRESTO) 24-26 MG Take 1 tablet by mouth 2  (two) times daily. 04/07/22  Yes Jettie Booze, MD  ?tadalafil (CIALIS) 20 MG tablet Take 1 tablet (20 mg total) by mouth daily as needed for erectile dysfunction. 03/06/20  Yes Isaac Bliss, Rayford Halsted, MD  ?tamsulosin (FLOMAX) 0.4 MG CAPS capsule TAKE 1 CAPSULE BY MOUTH  DAILY ?Patient taking differently: Take 0.4 mg by mouth daily. 10/30/21  Yes Isaac Bliss, Rayford Halsted, MD  ?PARoxetine (PAXIL) 20 MG tablet Take 1 tablet (20 mg total) by mouth daily. ?Patient not taking: Reported on 04/11/2022 12/10/21   Haydee Salter, MD  ? ? ?Physical Exam: ?Vitals:  ? 04/11/22 1415 04/11/22 1435 04/11/22 1445 04/11/22 1500  ?BP: (!) 102/58 (!) 113/59 (!) 103/56 (!) 113/55  ?Pulse: (!) 52 (!) 57 (!) 56 (!) 54  ?Resp: '17 17 16 19  '$ ?Temp:      ?TempSrc:      ?SpO2: 99% 98% 98% 97%  ? ?General: 81 y.o. male resting in bed in NAD ?Eyes: PERRL, normal sclera ?ENMT: Nares patent w/o discharge, orophaynx clear, dentition normal, ears w/o discharge/lesions/ulcers ?Neck: Supple, trachea midline ?Cardiovascular: brady, +S1, S2, no m/g/r, equal  pulses throughout ?Respiratory: CTABL, no w/r/r, normal WOB ?GI: BS+, NDNT, no masses noted, no organomegaly noted ?MSK: No e/c/c ?Neuro: A&O x 3, no focal deficits ?Psyc: Appropriate interaction and affect, calm/cooperative ? ?Data Reviewed: ? ?Na+ 137 ?Glucose  129 ?Bun  25 ?Scr  1.13 ?Trp  14 -> 10 ?WBC  13.3 ? ?Assessment and Plan: ?GIB ?    - place in obs, tele ?    - trend Hgb, transfuse for Hgb < 7 ?    - [facility] onboard, appreciate assistance ?    - hold plavix ?    - fluids, CLD ? ?Syncope ?    - d/t blood loss? ?    - check orthostatics ?    - check echo ?    - fluids ?    - he is brady, will monitor; if he continues, will need cards consult ? ?HTN ?Chronic systolic HF ?    - hold his home regimen as his pressures are soft ?    - watch daily wts, I&O ? ?HLD ?CAD ?    - hold his anti-platelets ?    - continue statin ? ?BPH ?    - continue home regimen ? ?Glaucoma ?    - continue  home regimen ? ?GERD ?    - PPI ? ?Advance Care Planning:   Code Status: FULL ? ?Consults: [facility] ? ?Family Communication: None at bedside ? ?Severity of Illness: ?The appropriate patient status for this patient is OBS

## 2022-04-11 NOTE — ED Notes (Signed)
Lab to add on troponin  

## 2022-04-11 NOTE — Progress Notes (Addendum)
2230 - Pt has had 1 bloody BM per hour since 2030. Appears to be just blood, no stool. 2 medium and 1 large. Pt able to ambulate and having no dizziness at this time.  ? ?2300 - Pt had a medium size liquid bloody BM, Dr. [place] Crane aware and orders received for bolus. IV team consult put in due to soft BP 87/47 (59).  ? ?2334 - Levophed started for BP 89/35 (54) in peripheral IV with IV watch in place. Goal is MAP 65. IV team at bedside to place US guided IV.  ? ?5/15 ?0014 - notified Dr. [place] Crane that pt had an 11 beat run of wide QRS complexes. Order received to transfuse 1 unit PRBCs.  ? ?0041 - large liquid bloody BM.  ? ?0149 - small liquid bloody BM. ? ?0220 - large liquid bloody BM, at least 377m. Dr. [place] Craneaware. VS stable.  ? ?0318 - 3023mliquid bloody BM. Dr. [place] Craneotified.  ? ?0500 - 20045miquid bloody BM.  ? ?0630 - 300m8mquid bloody BM. ?

## 2022-04-11 NOTE — Progress Notes (Signed)
Placed IV in R anterior forearm using U/S per pharmacy order to start pressors. Site with [facility]. Area on skin marked where IV watch to be placed. ?

## 2022-04-11 NOTE — ED Provider Notes (Addendum)
Cons  ?[facility] DEPT ?Provider Note ? ? ?CSN: [id number] ?Arrival date & time: 04/11/22  1152 ? ?  ? ?History ? ?Chief Complaint  ?Patient presents with  ?? Rectal Bleeding  ?? Hypotension  ? ? ?David Goodman is a 81 y.o. male. ? ? ?Rectal Bleeding ?Associated symptoms: no fever   ? ?Patient has history of hypertension, diverticulosis, coronary artery disease, syncope, diverticulitis, pulmonary fibrosis, stroke.  Patient presents ED with complaints of acute GI bleeding.  Patient is on Plavix.  He started noticing bloody stools this morning ? ?Patient denies any trouble with abdominal pain.  Has not had any fevers or chills.  No hematemesis.  Patient became very lightheaded on arrival at triage.  He had a syncopal episode ? ?Home Medications ?Prior to Admission medications   ?Medication Sig Start Date End Date Taking? Authorizing Provider  ?Ascorbic Acid (VITAMIN C) 1000 MG tablet Take 1,000 mg by mouth 2 (two) times a week.    [provider]  ?aspirin EC 81 MG tablet Take 81 mg by mouth at bedtime. Swallow whole.    [provider]  ?cetirizine (ZYRTEC) 10 MG tablet Take 1 tablet (10 mg total) by mouth daily. 07/10/21   Haydee Salter, MD  ?clopidogrel (PLAVIX) 75 MG tablet Take 1 tablet (75 mg total) by mouth daily. Take one tablet by mouth ( 75 mg) daily. 09/08/21 09/08/22  Richardson Dopp T, PA-C  ?fluticasone (FLONASE) 50 MCG/ACT nasal spray Place 1 spray into both nostrils 2 (two) times daily. 12/11/21   Haydee Salter, MD  ?latanoprost (XALATAN) 0.005 % ophthalmic solution Place 1 drop into both eyes at bedtime. 06/20/17   Marletta Lor, MD  ?MAGNESIUM PO Take 400 mg by mouth at bedtime.    [provider]  ?metoprolol succinate (TOPROL XL) 25 MG 24 hr tablet Take 1 tablet (25 mg total) by mouth daily. 01/01/22 01/01/23  Richardson Dopp T, PA-C  ?Multiple Vitamin (MULTIVITAMIN WITH MINERALS) TABS tablet Take 1 tablet by mouth daily.    [provider]  ?naproxen (NAPROSYN) 500 MG tablet TAKE 1 TABLET BY MOUTH  TWICE DAILY WITH MEALS ?Patient taking differently: Take 500 mg by mouth daily as needed. 10/30/21   Isaac Bliss, Rayford Halsted, MD  ?nitroGLYCERIN (NITROSTAT) 0.4 MG SL tablet Place 1 tablet (0.4 mg total) under the tongue every 5 (five) minutes as needed for chest pain. 07/22/21   Jettie Booze, MD  ?pantoprazole (PROTONIX) 40 MG tablet TAKE 1 TABLET BY MOUTH ONCE DAILY --PATIENT  TO  STOP  OMEPRAZOLE 03/29/22   Jettie Booze, MD  ?PARoxetine (PAXIL) 20 MG tablet Take 1 tablet (20 mg total) by mouth daily. 12/10/21   Haydee Salter, MD  ?psyllium (METAMUCIL) 58.6 % powder Take 1 packet by mouth 3 (three) times daily.    [provider]  ?rosuvastatin (CRESTOR) 20 MG tablet Take 1 tablet (20 mg total) by mouth daily. 08/25/21 08/25/22  Cheryln Manly, NP  ?sacubitril-valsartan (ENTRESTO) 24-26 MG Take 1 tablet by mouth 2 (two) times daily. 04/07/22   Jettie Booze, MD  ?tadalafil (CIALIS) 20 MG tablet Take 1 tablet (20 mg total) by mouth daily as needed for erectile dysfunction. 03/06/20   Isaac Bliss, Rayford Halsted, MD  ?tamsulosin (FLOMAX) 0.4 MG CAPS capsule TAKE 1 CAPSULE BY MOUTH  DAILY 10/30/21   Isaac Bliss, Rayford Halsted, MD  ?   ? ?Allergies    ?Patient has no known allergies.   ? ?  Review of Systems   ?Review of Systems  ?Constitutional:  Negative for fever.  ?Gastrointestinal:  Positive for hematochezia.  ? ?Physical Exam ?Updated Vital Signs ?BP 106/65   Pulse (!) 55   Temp (!) 97.4 ?F (36.3 ?C) (Oral)   Resp 16   SpO2 96%  ?Physical Exam ?Vitals and nursing note reviewed.  ?Constitutional:   ?   Appearance: He is well-developed. He is ill-appearing and diaphoretic.  ?   Comments: Was sitting in a chair as he was being transported back to the bed, when patient was lie down flat on the bed he regained consciousness, now answering questions  ?HENT:  ?   Head: Normocephalic and atraumatic.  ?   Right Ear:  External ear normal.  ?   Left Ear: External ear normal.  ?Eyes:  ?   General: No scleral icterus.    ?   Right eye: No discharge.     ?   Left eye: No discharge.  ?   Conjunctiva/sclera: Conjunctivae normal.  ?Neck:  ?   Trachea: No tracheal deviation.  ?Cardiovascular:  ?   Rate and Rhythm: Normal rate.  ?Pulmonary:  ?   Effort: Pulmonary effort is normal. No respiratory distress.  ?   Breath sounds: No stridor.  ?Abdominal:  ?   General: There is no distension.  ?Genitourinary: ?   Comments: Patient was incontinent of a large amount of blood clots ?Musculoskeletal:     ?   General: No swelling or deformity.  ?   Cervical back: Neck supple.  ?Skin: ?   General: Skin is warm.  ?   Coloration: Skin is pale.  ?   Findings: No rash.  ?Neurological:  ?   Mental Status: He is alert.  ?   Cranial Nerves: Cranial nerve deficit: no gross deficits.  ? ? ?ED Results / Procedures / Treatments   ?Labs ?(all labs ordered are listed, but only abnormal results are displayed) ?Labs Reviewed  ?COMPREHENSIVE METABOLIC PANEL - Abnormal; Notable for the following components:  ?    Result Value  ? Glucose, Bld 129 (*)   ? BUN 25 (*)   ? Calcium 8.8 (*)   ? All other components within normal limits  ?CBC WITH DIFFERENTIAL/PLATELET - Abnormal; Notable for the following components:  ? WBC 13.3 (*)   ? Hemoglobin 12.9 (*)   ? HCT 37.2 (*)   ? Neutro Abs 9.7 (*)   ? All other components within normal limits  ?CBG MONITORING, ED - Abnormal; Notable for the following components:  ? Glucose-Capillary 144 (*)   ? All other components within normal limits  ?PROTIME-INR  ?APTT  ?TYPE AND SCREEN  ?TROPONIN I (HIGH SENSITIVITY)  ?TROPONIN I (HIGH SENSITIVITY)  ? ? ?EKG ?EKG Interpretation ? ?Date/Time:  Sunday Apr 11 2022 12:16:45 EDT ?Ventricular Rate:  57 ?PR Interval:  165 ?QRS Duration: 102 ?QT Interval:  441 ?QTC Calculation: 430 ?R Axis:   152 ?Text Interpretation: Age not entered, assumed to be  81 years old for purpose of ECG interpretation  Right and left arm electrode reversal, interpretation assumes no reversal Sinus or ectopic atrial rhythm Probable lateral infarct, age indeterminate Confirmed by Dorie Rank [phone]) on 04/11/2022 12:32:46 PM ? ?Radiology ?No results found. ? ?Procedures ?Procedures  ? ? ?Medications Ordered in ED ?Medications  ?sodium chloride 0.9 % bolus 1,000 mL (0 mLs Intravenous Stopped 04/11/22 1317)  ?  And  ?0.9 %  sodium chloride infusion ( Intravenous New  Bag/Given 04/11/22 1300)  ?ondansetron [facility]) injection 4 mg (4 mg Intravenous Given 04/11/22 1228)  ? ? ?ED Course/ Medical Decision Making/ A&P ?Clinical Course as of 04/11/22 1405  ?Sun Apr 11, 2022  ?1214 Repeat blood pressure 638 systolic [JK]  ?1233 Patient states he noted some chest discomfort.  Will repeat EKG add on troponins.  Hold on any aspirin at this time which is active GI bleeding [JK]  ?1329 Troponin I (High Sensitivity) [JK]  ?1329 CBC with Differential(!) ?Hemoglobin slightly decreased compared to previous [JK]  ?1330 Comprehensive metabolic panel(!) ?BUN slightly elevated [JK]  ?1357 Case discussed with Dr. Alessandra Bevels.  GI will evaluate the patient.  Discussed GI bleeding scan and does not feel that is necessary at this time with stable hemoglobin.  May reconsider if circumstances change [JK]  ?1400 Case discussed with Dr Marylyn Ishihara [JK]  ?  ?Clinical Course User Index ?[JK] Dorie Rank, MD  ? ?                        ?Medical Decision Making ?Problems Addressed: ?Rectal bleeding: acute illness or injury that poses a threat to life or bodily functions ?Vasovagal syncope: acute illness or injury that poses a threat to life or bodily functions ? ?Amount and/or Complexity of Data Reviewed ?External Data Reviewed: notes. ?   Details: Colonoscopy procedure by Dr. Therisa Doyne in March 2021.  Evidence of diverticulosis but no active bleeding ?Labs: ordered. Decision-making details documented in ED Course. ?ECG/medicine tests: ordered. ?Discussion of management or test  interpretation with external provider(s): Case discussed with Dr. Alessandra Bevels regarding his case ? ?Risk ?Prescription drug management. ?Decision regarding hospitalization. ? ? ?Patient presented to ED with complaints of

## 2022-04-12 ENCOUNTER — Observation Stay (HOSPITAL_COMMUNITY): Payer: Medicare Other | Admitting: Certified Registered"

## 2022-04-12 ENCOUNTER — Encounter (HOSPITAL_COMMUNITY): Payer: Self-pay | Admitting: Internal Medicine

## 2022-04-12 ENCOUNTER — Observation Stay (HOSPITAL_BASED_OUTPATIENT_CLINIC_OR_DEPARTMENT_OTHER): Payer: Medicare Other | Admitting: Certified Registered"

## 2022-04-12 ENCOUNTER — Encounter (HOSPITAL_COMMUNITY)
Admission: EM | Disposition: A | Discharge status: Home / Self Care | Payer: Self-pay | Source: Home / Self Care | Attending: Internal Medicine

## 2022-04-12 ENCOUNTER — Observation Stay (HOSPITAL_BASED_OUTPATIENT_CLINIC_OR_DEPARTMENT_OTHER): Payer: Medicare Other

## 2022-04-12 DIAGNOSIS — K5730 Diverticulosis of large intestine without perforation or abscess without bleeding: Secondary | ICD-10-CM | POA: Diagnosis not present

## 2022-04-12 DIAGNOSIS — K626 Ulcer of anus and rectum: Secondary | ICD-10-CM | POA: Diagnosis not present

## 2022-04-12 DIAGNOSIS — K625 Hemorrhage of anus and rectum: Secondary | ICD-10-CM

## 2022-04-12 DIAGNOSIS — R55 Syncope and collapse: Secondary | ICD-10-CM

## 2022-04-12 DIAGNOSIS — I2510 Atherosclerotic heart disease of native coronary artery without angina pectoris: Secondary | ICD-10-CM

## 2022-04-12 DIAGNOSIS — I9589 Other hypotension: Secondary | ICD-10-CM | POA: Diagnosis not present

## 2022-04-12 DIAGNOSIS — K922 Gastrointestinal hemorrhage, unspecified: Secondary | ICD-10-CM | POA: Diagnosis not present

## 2022-04-12 DIAGNOSIS — K648 Other hemorrhoids: Secondary | ICD-10-CM | POA: Diagnosis not present

## 2022-04-12 DIAGNOSIS — I25118 Atherosclerotic heart disease of native coronary artery with other forms of angina pectoris: Secondary | ICD-10-CM | POA: Diagnosis not present

## 2022-04-12 DIAGNOSIS — E861 Hypovolemia: Secondary | ICD-10-CM | POA: Diagnosis not present

## 2022-04-12 DIAGNOSIS — I5020 Unspecified systolic (congestive) heart failure: Secondary | ICD-10-CM

## 2022-04-12 HISTORY — PX: COLONOSCOPY: SHX5424

## 2022-04-12 LAB — CBC
HCT: 26.1 % — ABNORMAL LOW (ref 39.0–52.0)
Hemoglobin: 9.1 g/dL — ABNORMAL LOW (ref 13.0–17.0)
MCH: 30.3 pg (ref 26.0–34.0)
MCHC: 34.9 g/dL (ref 30.0–36.0)
MCV: 87 fL (ref 80.0–100.0)
Platelets: 154 10*3/uL (ref 150–400)
RBC: 3 MIL/uL — ABNORMAL LOW (ref 4.22–5.81)
RDW: 15.1 % (ref 11.5–15.5)
WBC: 10.1 10*3/uL (ref 4.0–10.5)
nRBC: 0 % (ref 0.0–0.2)

## 2022-04-12 LAB — CBC WITH DIFFERENTIAL/PLATELET
Abs Immature Granulocytes: 0.05 10*3/uL (ref 0.00–0.07)
Basophils Absolute: 0 10*3/uL (ref 0.0–0.1)
Basophils Relative: 0 %
Eosinophils Absolute: 0.2 10*3/uL (ref 0.0–0.5)
Eosinophils Relative: 2 %
HCT: 26 % — ABNORMAL LOW (ref 39.0–52.0)
Hemoglobin: 9 g/dL — ABNORMAL LOW (ref 13.0–17.0)
Immature Granulocytes: 0 %
Lymphocytes Relative: 14 %
Lymphs Abs: 2 10*3/uL (ref 0.7–4.0)
MCH: 31.3 pg (ref 26.0–34.0)
MCHC: 34.6 g/dL (ref 30.0–36.0)
MCV: 90.3 fL (ref 80.0–100.0)
Monocytes Absolute: 1 10*3/uL (ref 0.1–1.0)
Monocytes Relative: 7 %
Neutro Abs: 10.9 10*3/uL — ABNORMAL HIGH (ref 1.7–7.7)
Neutrophils Relative %: 77 %
Platelets: 185 10*3/uL (ref 150–400)
RBC: 2.88 MIL/uL — ABNORMAL LOW (ref 4.22–5.81)
RDW: 14.1 % (ref 11.5–15.5)
WBC: 14.2 10*3/uL — ABNORMAL HIGH (ref 4.0–10.5)
nRBC: 0 % (ref 0.0–0.2)

## 2022-04-12 LAB — HEMOGLOBIN AND HEMATOCRIT, BLOOD
HCT: 24.2 % — ABNORMAL LOW (ref 39.0–52.0)
HCT: 21.8 % — ABNORMAL LOW (ref 39.0–52.0)
Hemoglobin: 8.7 g/dL — ABNORMAL LOW (ref 13.0–17.0)
Hemoglobin: 7.7 g/dL — ABNORMAL LOW (ref 13.0–17.0)

## 2022-04-12 LAB — ECHOCARDIOGRAM COMPLETE
AR max vel: 2.07 cm2
AV Area VTI: 2.02 cm2
AV Area mean vel: 2.02 cm2
AV Mean grad: 8 mmHg
AV Peak grad: 15.2 mmHg
Ao pk vel: 1.95 m/s
Area-P 1/2: 2.87 cm2
Height: 65 in
S' Lateral: 3.8 cm
Weight: 2726.65 oz

## 2022-04-12 LAB — COMPREHENSIVE METABOLIC PANEL
ALT: 14 U/L (ref 0–44)
ALT: 14 U/L (ref 0–44)
AST: 16 U/L (ref 15–41)
AST: 14 U/L — ABNORMAL LOW (ref 15–41)
Albumin: 2.7 g/dL — ABNORMAL LOW (ref 3.5–5.0)
Albumin: 2.7 g/dL — ABNORMAL LOW (ref 3.5–5.0)
Alkaline Phosphatase: 30 U/L — ABNORMAL LOW (ref 38–126)
Alkaline Phosphatase: 29 U/L — ABNORMAL LOW (ref 38–126)
Anion gap: 4 — ABNORMAL LOW (ref 5–15)
Anion gap: 5 (ref 5–15)
BUN: 30 mg/dL — ABNORMAL HIGH (ref 8–23)
BUN: 27 mg/dL — ABNORMAL HIGH (ref 8–23)
CO2: 18 mmol/L — ABNORMAL LOW (ref 22–32)
CO2: 18 mmol/L — ABNORMAL LOW (ref 22–32)
Calcium: 7 mg/dL — ABNORMAL LOW (ref 8.9–10.3)
Calcium: 6.9 mg/dL — ABNORMAL LOW (ref 8.9–10.3)
Chloride: 112 mmol/L — ABNORMAL HIGH (ref 98–111)
Chloride: 113 mmol/L — ABNORMAL HIGH (ref 98–111)
Creatinine, Ser: 0.86 mg/dL (ref 0.61–1.24)
Creatinine, Ser: 0.9 mg/dL (ref 0.61–1.24)
GFR, Estimated: >60 mL/min (ref >60)
GFR, Estimated: >60 mL/min (ref >60)
Glucose, Bld: 133 mg/dL — ABNORMAL HIGH (ref 70–99)
Glucose, Bld: 128 mg/dL — ABNORMAL HIGH (ref 70–99)
Potassium: 3.8 mmol/L (ref 3.5–5.1)
Potassium: 3.4 mmol/L — ABNORMAL LOW (ref 3.5–5.1)
Sodium: 134 mmol/L — ABNORMAL LOW (ref 135–145)
Sodium: 136 mmol/L (ref 135–145)
Total Bilirubin: 1.3 mg/dL — ABNORMAL HIGH (ref 0.3–1.2)
Total Bilirubin: 1.2 mg/dL (ref 0.3–1.2)
Total Protein: 4.5 g/dL — ABNORMAL LOW (ref 6.5–8.1)
Total Protein: 4.3 g/dL — ABNORMAL LOW (ref 6.5–8.1)

## 2022-04-12 LAB — PROTIME-INR
INR: 1.4 — ABNORMAL HIGH (ref 0.8–1.2)
INR: 1.4 — ABNORMAL HIGH (ref 0.8–1.2)
Prothrombin Time: 16.9 seconds — ABNORMAL HIGH (ref 11.4–15.2)
Prothrombin Time: 17.1 seconds — ABNORMAL HIGH (ref 11.4–15.2)

## 2022-04-12 LAB — MAGNESIUM: Magnesium: 1.7 mg/dL (ref 1.7–2.4)

## 2022-04-12 LAB — PREPARE RBC (CROSSMATCH)

## 2022-04-12 LAB — PHOSPHORUS: Phosphorus: 3.2 mg/dL (ref 2.5–4.6)

## 2022-04-12 SURGERY — COLONOSCOPY
Anesthesia: Monitor Anesthesia Care

## 2022-04-12 MED ORDER — FENTANYL CITRATE (PF) 100 MCG/2ML IJ SOLN
INTRAMUSCULAR | Status: AC
Start: 2022-04-12 — End: ?
  Filled 2022-04-12: qty 2

## 2022-04-12 MED ORDER — PIPERACILLIN-TAZOBACTAM 3.375 G IVPB
3.3750 g | Freq: Three times a day (TID) | INTRAVENOUS | Status: DC
  Administered 2022-04-12 – 2022-04-16 (×14): 3.375 g via INTRAVENOUS
  Filled 2022-04-12 (×14): qty 50

## 2022-04-12 MED ORDER — OXYCODONE HCL 5 MG/5ML PO SOLN: 5.0000 mg | Freq: Once | ORAL | Status: DC | PRN

## 2022-04-12 MED ORDER — ONDANSETRON HCL 4 MG/2ML IJ SOLN: 4.0000 mg | Freq: Once | INTRAMUSCULAR | Status: DC | PRN

## 2022-04-12 MED ORDER — SODIUM CHLORIDE 0.9% IV SOLUTION: Freq: Once | INTRAVENOUS | Status: DC

## 2022-04-12 MED ORDER — OXYCODONE HCL 5 MG PO TABS: 5.0000 mg | ORAL_TABLET | Freq: Once | ORAL | Status: DC | PRN

## 2022-04-12 MED ORDER — CHLORHEXIDINE GLUCONATE CLOTH 2 % EX PADS
6.0000 | MEDICATED_PAD | Freq: Every day | CUTANEOUS | Status: DC
  Administered 2022-04-12 – 2022-04-16 (×3): 6 via TOPICAL

## 2022-04-12 MED ORDER — SODIUM CHLORIDE 0.9 % IV SOLN: INTRAVENOUS | Status: DC

## 2022-04-12 MED ORDER — SORBITOL 70 % SOLN: 960.0000 mL | TOPICAL_OIL | Freq: Once | ORAL | Status: DC

## 2022-04-12 MED ORDER — SODIUM CHLORIDE 0.9 % IV SOLN: INTRAVENOUS | Status: AC

## 2022-04-12 MED ORDER — FENTANYL CITRATE (PF) 100 MCG/2ML IJ SOLN: 25.0000 ug | INTRAMUSCULAR | Status: DC | PRN

## 2022-04-12 MED ORDER — SORBITOL 70 % SOLN
960.0000 mL | TOPICAL_OIL | Freq: Once | ORAL | Status: AC
  Administered 2022-04-12: 960 mL via RECTAL
  Filled 2022-04-12: qty 473

## 2022-04-12 MED ORDER — MIDAZOLAM HCL (PF) 5 MG/ML IJ SOLN
INTRAMUSCULAR | Status: AC
  Filled 2022-04-12: qty 1

## 2022-04-12 MED ORDER — MEPERIDINE HCL 50 MG/ML IJ SOLN: 6.2500 mg | INTRAMUSCULAR | Status: DC | PRN

## 2022-04-12 MED ORDER — SODIUM CHLORIDE 0.9 % IV BOLUS
250.0000 mL | Freq: Once | INTRAVENOUS | Status: AC
  Administered 2022-04-12: 250 mL via INTRAVENOUS

## 2022-04-12 MED ORDER — ACETAMINOPHEN 325 MG PO TABS: 325.0000 mg | ORAL_TABLET | ORAL | Status: DC | PRN

## 2022-04-12 MED ORDER — ACETAMINOPHEN 160 MG/5ML PO SOLN: 325.0000 mg | ORAL | Status: DC | PRN

## 2022-04-12 MED ORDER — PROPOFOL 500 MG/50ML IV EMUL
INTRAVENOUS | Status: DC | PRN
  Administered 2022-04-12: 100 ug/kg/min via INTRAVENOUS

## 2022-04-12 MED ORDER — SODIUM CHLORIDE 0.9% IV SOLUTION: Freq: Once | INTRAVENOUS | Status: AC

## 2022-04-12 NOTE — Progress Notes (Signed)
? ?NAME:  David Goodman, MRN:  [id number], DOB:  1941-05-14, LOS: 0 ?ADMISSION DATE:  04/11/2022, CONSULTATION DATE:  04/11/2022 ?REFERRING MD:  Cherylann Ratel, DO, CHIEF COMPLAINT:  GI bleed  ? ?History of Present Illness:  ?81 year old male hx peptic ulcer disease, GERD, HTN, HLD, BPH and anxiety who presents with rectal bleeding. He has recently been taking Naproxex twice a day for chronic knee pain. Today he noted bloody stool associated with weakness. In the ED had an episode of syncope after blood BM. He was given IVF 1L and improved. TRH admitted patient. ?  ?Called by [facility] 5/14. Patient had orthostatic hypotension with SBP in the 70s and additional bloody BM x 3. GI consulted. CTA and H/H ordered. Given additional 1L IVF. PCCM consulted ? ?Pertinent  Medical History  ?CAD on Plavix and ASA, peptic ulcer disease, GERD, HTN, HLD, BPH and anxiety ? ?Significant Hospital Events: ?Including procedures, antibiotic start and stop dates in addition to other pertinent events   ?5/14 Initially admitted to [facility]. PCCM consulted for hemorrhagic shock ?5/15 Multiple bloody stools overnight, currently on 24mg of levo ? ?Interim History / Subjective:  ?Seen lying in bed with no acute complaints other than intermittent crampy ABD pain  ? ?Objective   ?Blood pressure (!) 125/57, pulse 77, temperature 97.8 ?F (36.6 ?C), temperature source Oral, resp. rate 20, height '5\' 5"'$  (1.651 m), weight 77.3 kg, SpO2 100 %. ?   ?   ? ?Intake/Output Summary (Last 24 hours) at 04/12/2022 0741 ?Last data filed at 04/12/2022 0700 ?Gross per 24 hour  ?Intake 3706.4 ml  ?Output --  ?Net 3706.4 ml  ? ?Filed Weights  ? 04/11/22 1632 04/11/22 1833  ?Weight: 76.9 kg 77.3 kg  ? ? ?Examination: ?General: Well appearing elderly male lying in bed, in NAD ?HEENT: [place]/AT, MM pink/moist, PERRL,  ?Neuro: Alert and oriented x3, non-focal, no increased work of breathing, no added breath sounds  ?CV: s1s2 regular rate and rhythm, no murmur, rubs, or gallops,  ?PULM:  Clear  to ascultation, no added breath sounds  ?GI: soft, bowel sounds active in all 4 quadrants, non-tender, non-distended ?Extremities: warm/dry, no edema  ?Skin: no rashes or lesions ? ?Resolved Hospital Problem list   ? ? ?Assessment & Plan:  ?Hemorrhagic shock ?Acute blood loss anemia ?Active GIB ?Syncope ?-Multiple bloody stools overnight, S/P 3 units PRBC since admit  ?P: ?GI following, appreciated assistance ?Consider tagged scan as patient continues to have bloody stools  ?Trend CBC  ?Transfuse per protocol  ?Hgb gola >7 ?Wean pressors for MAP goal > 65 ?PPI BID  ?NPO  ?Maintain 2 large bore PIVs ?  ?CAD ?Bradycardia ?HTN ?Chronic systolic heart failure ?P: ?Home ASA and Plavix on hold  ?ECHO pending  ?Strict intake and output  ?Continuous telemetry  ?  ?OSA ?P: ?Continue home CPAP ? ?Best Practice (right click and "Reselect all SmartList Selections" daily)  ? ?Diet/type: NPO ?DVT prophylaxis: SCD ?GI prophylaxis: PPI ?Lines: N/A ?Foley:  N/A ?Code Status:  full code ?Last date of multidisciplinary goals of care discussion: Continue to update patient daily  ?  ?Critical care time:   ? ?Performed by: Daran Favaro D. Harris ? ?Total critical care time: 37 minutes ? ?Critical care time was exclusive of separately billable procedures and treating other patients. ? ?Critical care was necessary to treat or prevent imminent or life-threatening deterioration. ? ?Critical care was time spent personally by me on the following activities: development of treatment plan with patient and/or  surrogate as well as nursing, discussions with consultants, evaluation of patient's response to treatment, examination of patient, obtaining history from patient or surrogate, ordering and performing treatments and interventions, ordering and review of laboratory studies, ordering and review of radiographic studies, pulse oximetry and re-evaluation of patient's condition. ? ?Marsden Zaino D. Harris, NP-C ?[facility] ?Personal  contact information can be found on Amion  ?04/12/2022, 8:42 AM ? ? ? ? ? ? ?

## 2022-04-12 NOTE — Transfer of Care (Signed)
Immediate Anesthesia Transfer of Care Note ? ?Patient: David Goodman ? ?Procedure(s) Performed: COLONOSCOPY ? ?Patient Location: PACU ? ?Anesthesia Type:MAC ? ?Level of Consciousness: awake, alert  and oriented ? ?Airway & Oxygen Therapy: Patient Spontanous Breathing and Patient connected to face mask oxygen ? ?Post-op Assessment: Report given to RN and Post -op Vital signs reviewed and stable ? ?Post vital signs: Reviewed and stable ? ?Last Vitals:  ?Vitals Value Taken Time  ?BP    ?Temp    ?Pulse    ?Resp    ?SpO2    ? ? ?Last Pain:  ?Vitals:  ? 04/12/22 1228  ?TempSrc: Temporal  ?PainSc: 0-No pain  ?   ? ?  ? ?Complications: No notable events documented. ?

## 2022-04-12 NOTE — Anesthesia Postprocedure Evaluation (Signed)
Anesthesia Post Note ? ?Patient: David Goodman ? ?Procedure(s) Performed: COLONOSCOPY ? ?  ? ?Patient location during evaluation: Endoscopy ?Anesthesia Type: MAC ?Level of consciousness: awake and alert, patient cooperative and oriented ?Pain management: pain level controlled ?Vital Signs Assessment: post-procedure vital signs reviewed and stable ?Respiratory status: spontaneous breathing, nonlabored ventilation and respiratory function stable ?Cardiovascular status: stable ?Postop Assessment: no apparent nausea or vomiting ?Anesthetic complications: no ? ? ?No notable events documented. ? ?Last Vitals:  ?Vitals:  ? 04/12/22 1323 04/12/22 1330  ?BP: (!) 99/49 (!) 118/48  ?Pulse: 77 63  ?Resp: (!) 24 (!) 22  ?Temp: 36.5 ?C   ?SpO2: 100% 100%  ?  ?Last Pain:  ?Vitals:  ? 04/12/22 1330  ?TempSrc:   ?PainSc: 0-No pain  ? ? ?  ?  ?  ?  ?  ?  ? ?Ares Cardozo,E. Virl Coble ? ? ? ? ?

## 2022-04-12 NOTE — Progress Notes (Signed)
Patient refused CPAP tonight 

## 2022-04-12 NOTE — Progress Notes (Addendum)
Patient has recurrent lower GI bleed and became hypotensive.  Vasopressor Levophed started to maintain MAP>65.  On IV Protonix 40 mg BID.  Patient on DAPT (s/p PCI with stent placement on 08/25/21) prior to admission, last doses were taken on 04/11/22 AM and held on admission.  Plavix reversal agent, K-Centra, deferred for now.   ? ?Added Zosyn due to colitis seen on CTA.   ? ?1U PRBC ordered to transfuse with goal Hg >8.0.  Continue serial H&H.  GI Dr. Paulita Fujita made aware via paging system. ? ?Discussed the case with GI, Dr. Paulita Fujita, who recommended to continue with supportive care for possible diverticular bleed. ?

## 2022-04-12 NOTE — Progress Notes (Signed)
eLink Physician-Brief Progress Note ?Patient Name: David Goodman ?DOB: August 07, 1941 ?MRN: [id number] ? ? ?Date of Service ? 04/12/2022  ?HPI/Events of Note ? Notified that patient had another bloody bowel movement putting out 300cc.  ? ?Pt is awake and alert. BP 106/53, HR 60, RR 17, o2 sats 100%.  ?eICU Interventions ? Transfuse 1 more unit pRBCs given active bleeding.   ? ? ? ?Intervention Category ?Intermediate Interventions: Bleeding - evaluation and treatment with blood products ? ?Elsie Lincoln ?04/12/2022, 3:21 AM ?

## 2022-04-12 NOTE — Progress Notes (Signed)
[facility] Progress Note ? ?David Goodman 81 y.o. [birth year] ? ?CC:  Hematochezia, lower GI bleed ? ? ?Subjective: ?Patient seen and examined laying in bed.  Patient continues to have large volume watery, bright red blood bowel movements.  Bowel movements occurring every 30 minutes to 1 hour per nurse.  ? ?ROS : Review of Systems  ?Gastrointestinal:  Positive for blood in stool and diarrhea. Negative for abdominal pain, constipation, heartburn, melena, nausea and vomiting.  ?Genitourinary:  Negative for dysuria and urgency.  ?Neurological:  Positive for dizziness.  ? ? ? ?Objective: ?Vital signs in last 24 hours: ?Vitals:  ? 04/12/22 0800 04/12/22 0817  ?BP:  (!) 125/50  ?Pulse: 78 73  ?Resp: (!) 25 20  ?Temp:    ?SpO2: 98% 99%  ? ? ?Physical Exam: ? ?General:  Alert, cooperative, no distress, appears stated age  ?Head:  Normocephalic, without obvious abnormality, atraumatic  ?Eyes:  Anicteric sclera, EOM's intact  ?Lungs:   Clear to auscultation bilaterally, respirations unlabored  ?Heart:  Regular rate and rhythm, S1, S2 normal  ?Abdomen:   Soft, non-tender, bowel sounds active all four quadrants,  no masses,   ?Extremities: Extremities normal, atraumatic, no  edema  ?Pulses: 2+ and symmetric  ? ? ?Lab Results: ?Recent Labs  ?  04/12/22 ?7902 04/12/22 ?4097  ?NA 134* 136  ?K 3.8 3.4*  ?CL 112* 113*  ?CO2 18* 18*  ?GLUCOSE 133* 128*  ?BUN 30* 27*  ?CREATININE 0.86 0.90  ?CALCIUM 7.0* 6.9*  ?MG 1.7  --   ?PHOS 3.2  --   ? ?Recent Labs  ?  04/12/22 ?3532 04/12/22 ?9924  ?AST 16 14*  ?ALT 14 14  ?ALKPHOS 30* 29*  ?BILITOT 1.3* 1.2  ?PROT 4.5* 4.3*  ?ALBUMIN 2.7* 2.7*  ? ?Recent Labs  ?  04/11/22 ?1224 04/11/22 ?1758 04/12/22 ?2683 04/12/22 ?4196  ?WBC 13.3*  --  14.2* 10.1  ?NEUTROABS 9.7*  --  10.9*  --   ?HGB 12.9*   < > 9.0* 9.1*  ?HCT 37.2*   < > 26.0* 26.1*  ?MCV 87.5  --  90.3 87.0  ?PLT 263  --  185 154  ? < > = values in this interval not displayed.  ? ?Recent Labs  ?  04/12/22 ?2229 04/12/22 ?7989   ?LABPROT 16.9* 17.1*  ?INR 1.4* 1.4*  ? ? ? ? ?Assessment ?Hematochezia ?Lower GI bleed ?Anemia ? ?Hemoglobin remained stable at 9.1 today. ?BUN slightly elevated at 27. ?PT 17.1 INR 1.4 ? ?Patient continues to have bright red blood and large watery bowel movements.  Denies history of diverticular bleed.  Currently on Plavix and aspirin, last dose of Plavix yesterday. ? ?CT angio GI bleed 04/11/2022 ?VASCULAR  ?Delayed mucosal blush in the region of the rectum with some ?circumferential wall thickening consistent with focal colitis with ?mild hemorrhage. ?  ?NON-VASCULAR ?Fatty liver. ?Prominent soft tissue at the region of the anus which may represent ?external hemorrhoids. Correlate with physical exam. ? ?Plan: ?Plan for flex sig today. I thoroughly discussed the procedures to include nature, alternatives, benefits, and risks including but not limited to bleeding, perforation, infection, anesthesia/cardiac and pulmonary complications. Patient provides understanding and gave verbal consent to proceed. ?Continue Protonix 40 mg IV BID. ?We will do smog enema at 9 AM and again at 11 AM.  N.p.o. at 9 AM. ?Continue daily CBC with transfusion as needed to maintain Hgb >7.  ?[facility] will follow.    ? ?Charlott Rakes PA-C ?  04/12/2022, 9:31 AM ? ?Contact #  [phone]  ?

## 2022-04-12 NOTE — Progress Notes (Signed)
[facility] ? ?PROGRESS NOTE ? ?David Goodman DUK:[id number] DOB: February 26, 1941 DOA: 04/11/2022 ?PCP: Haydee Salter, MD ? ? ?Brief HPI:   ? ?81 year old male with medical history of glaucoma, GERD, hypertension, hyperlipidemia, CAD, s/p PCI in September 2022, pulmonary fibrosis BPH, anxiety presented with rectal bleeding.  [facility] was consulted.  Plavix was held.  Patient also had syncopal episode while in the triage.  Patient takes naproxen on a daily basis.  CT angiogram for GI bleed was obtained yesterday which showed delayed mucosal blush in the region of rectum with some circumferential wall thickening consistent with focal colitis with mild hemorrhage.   ? ?Patient became hypotensive last night with SBP in 70s with additional bloody BMs x3.  PCCM was consulted and patient started on Levophed pressor support. ? ? ? ?Subjective  ? ?Patient seen and examined, seen following.  Still requiring Levophed.  Also started empirically on Zosyn for intra-abdominal infection ? ? Assessment/Plan:  ? ? ? ?Hemorrhagic shock ?-Stable ?-Secondary to ongoing GI bleed ?-Transferred to ICU, started on Levophed ?-S/p 1 unit PRBC ?-Hemoglobin this morning was 9.1, dropped to 7.7, 6 hours later ?-Continue H&H every 6 hours and transfuse for hemoglobin less than 7 ?-PCCM following ? ?Syncope ?-Echocardiogram obtained today shows EF of 40 to 45%, left ventricle shows global hypokinesis ?-Grade 1 diastolic dysfunction ? ?GI bleed ?-Patient underwent colonoscopy today ?-Found to have internal hemorrhoids and diverticulosis ?-No clear source of bleeding identified ?-GI recommends repeating CTA to localize site of GI bleed ?-Continue Protonix 40 mg IV every 12 hours ? ?CAD s/p PCI ?-Plavix and aspirin are currently on hold ? ?Chronic systolic CHF ? ? ?Obstructive sleep apnea ?-Continue CPAP nightly ? ? ? ? ?Medications ? ?  ? chlorhexidine  15 mL Mouth Rinse BID  ? Chlorhexidine Gluconate Cloth  6 each Topical Daily  ?  latanoprost  1 drop Both Eyes QHS  ? mouth rinse  15 mL Mouth Rinse q12n4p  ? pantoprazole (PROTONIX) IV  40 mg Intravenous Q12H  ? rosuvastatin  20 mg Oral Daily  ? ? ? Data Reviewed:  ? ?CBG: ? ?Recent Labs  ?Lab 04/11/22 ?1211  ?GLUCAP 144*  ? ? ?SpO2: 100 % ?O2 Flow Rate (L/min): 4 L/min  ? ? ?Vitals:  ? 04/12/22 1545 04/12/22 1600 04/12/22 1615 04/12/22 1622  ?BP: (!) 110/58   (!) 120/40  ?Pulse: 69 88 72 76  ?Resp: 20 13 (!) 22 (!) 21  ?Temp:  97.8 ?F (36.6 ?C)    ?TempSrc:  Axillary    ?SpO2: 100% 100% 100% 100%  ?Weight:      ?Height:      ? ? ? ? ?Data Reviewed: ? ?Basic Metabolic Panel: ?Recent Labs  ?Lab 04/11/22 ?1224 04/12/22 ?3762 04/12/22 ?8315  ?NA 137 134* 136  ?K 4.2 3.8 3.4*  ?CL 108 112* 113*  ?CO2 22 18* 18*  ?GLUCOSE 129* 133* 128*  ?BUN 25* 30* 27*  ?CREATININE 1.13 0.86 0.90  ?CALCIUM 8.8* 7.0* 6.9*  ?MG  --  1.7  --   ?PHOS  --  3.2  --   ? ? ?CBC: ?Recent Labs  ?Lab 04/11/22 ?1224 04/11/22 ?1758 04/11/22 ?2257 04/12/22 ?1761 04/12/22 ?6073 04/12/22 ?1115 04/12/22 ?1635  ?WBC 13.3*  --   --  14.2* 10.1  --   --   ?NEUTROABS 9.7*  --   --  10.9*  --   --   --   ?HGB 12.9*   < >  9.0* 9.0* 9.1* 8.7* 7.7*  ?HCT 37.2*   < > 26.9* 26.0* 26.1* 24.2* 21.8*  ?MCV 87.5  --   --  90.3 87.0  --   --   ?PLT 263  --   --  185 154  --   --   ? < > = values in this interval not displayed.  ? ? ?LFT ?Recent Labs  ?Lab 04/11/22 ?1224 04/12/22 ?3818 04/12/22 ?2993  ?AST 24 16 14*  ?ALT '20 14 14  '$ ?ALKPHOS 45 30* 29*  ?BILITOT 0.9 1.3* 1.2  ?PROT 6.6 4.5* 4.3*  ?ALBUMIN 4.0 2.7* 2.7*  ? ?  ?Antibiotics: ?Anti-infectives (From admission, onward)  ? ? Start     Dose/Rate Route Frequency Ordered Stop  ? 04/12/22 0200  piperacillin-tazobactam (ZOSYN) IVPB 3.375 g       ? 3.375 g ?12.5 mL/hr over 240 Minutes Intravenous Every 8 hours 04/12/22 0045    ? ?  ? ? ? ?DVT prophylaxis: SCDs ? ?Code Status: Full code ? ?Family Communication: No family at bedside ? ? ?CONSULTS gastroenterology, PCCM ? ? ?Objective   ? ? ?Physical Examination: ? ?General-appears in no acute distress ?Heart-S1-S2, regular, no murmur auscultated ?Lungs-clear to auscultation bilaterally, no wheezing or crackles auscultated ?Abdomen-soft, nontender, no organomegaly ?Extremities-no edema in the lower extremities ?Neuro-alert, oriented x3, no focal deficit noted ? ?Status is: Inpatient:   ? ? ? ?  ? ? ? ? ? ?Oswald Hillock ?  ?[facility] ?If 7PM-7AM, please contact night-coverage at [email or web address], ?Office  [phone] ? ? ?04/12/2022, 5:16 PM  LOS: 0 days  ? ? ? ? ? ? ? ? ? ? ?  ?

## 2022-04-12 NOTE — Progress Notes (Signed)
?  Transition of Care (TOC) Screening Note ? ? ?Patient Details  ?Name: David Goodman ?Date of Birth: 06/03/1941 ? ? ?Transition of Care (TOC) CM/SW Contact:    ?Rufina Kimery, LCSW ?Phone Number: ?04/12/2022, 3:35 PM ? ? ? ?Transition of Care Department [facility]) has reviewed patient and no TOC needs have been identified at this time. We will continue to monitor patient advancement through interdisciplinary progression rounds. If new patient transition needs arise, please place a TOC consult. ? ? ?

## 2022-04-12 NOTE — Plan of Care (Signed)
?  Problem: Clinical Measurements: ?Goal: Ability to maintain clinical measurements within normal limits will improve ?04/12/2022 0441 by Cristino Martes, RN ?Outcome: Not Progressing ?04/12/2022 0440 by Cristino Martes, RN ?Outcome: Not Progressing ?Goal: Diagnostic test results will improve ?Outcome: Not Progressing ?Goal: Cardiovascular complication will be avoided ?Outcome: Not Progressing ?  ?Problem: Activity: ?Goal: Risk for activity intolerance will decrease ?Outcome: Not Progressing ?  ?

## 2022-04-12 NOTE — Progress Notes (Signed)
Patient agreeable to RN at this time to try CPAP due to apneic episodes monitored. Patient placed on CPAP at this time. Will continue to monitor.  ?

## 2022-04-12 NOTE — Op Note (Signed)
[facility] ?Patient Name: David Goodman ?Procedure Date: 04/12/2022 ?MRN: [id number] ?Attending MD: Arta Silence , MD ?Date of Birth: October 21, 1941 ?CSN: [id number] ?Age: 80 ?Admit Type: Inpatient ?Procedure:                Colonoscopy ?Indications:              Hematochezia, Abnormal CT Angiogram of GI tract  ?                          (mild blood accumulation in the rectum) ?Providers:                Arta Silence, MD, Doristine Johns, RN, [place]  ?                          [place], [employer] ?Referring MD:             [facility]/PCCM ?Medicines:                Monitored Anesthesia Care ?Complications:            No immediate complications. ?Estimated Blood Loss:     Estimated blood loss: none. ?Procedure:                Pre-Anesthesia Assessment: ?                          - Prior to the procedure, a History and Physical  ?                          was performed, and patient medications and  ?                          allergies were reviewed. The patient's tolerance of  ?                          previous anesthesia was also reviewed. The risks  ?                          and benefits of the procedure and the sedation  ?                          options and risks were discussed with the patient.  ?                          All questions were answered, and informed consent  ?                          was obtained. Prior Anticoagulants: The patient has  ?                          taken Plavix (clopidogrel), last dose was 1 day  ?                          prior to procedure. ASA Grade Assessment: IV - A  ?  patient with severe systemic disease that is a  ?                          constant threat to life. After reviewing the risks  ?                          and benefits, the patient was deemed in  ?                          satisfactory condition to undergo the procedure. ?                          After obtaining informed consent, the colonoscope  ?                           was passed under direct vision. Throughout the  ?                          procedure, the patient's blood pressure, pulse, and  ?                          oxygen saturations were monitored continuously. The  ?                          [id number] ([id number]) Olympus therapeutic endoscope  ?                          was introduced through the anus and advanced to the  ?                          the ileocecal valve. After obtaining informed  ?                          consent, the colonoscope was passed under direct  ?                          vision. Throughout the procedure, the patient's  ?                          blood pressure, pulse, and oxygen saturations were  ?                          monitored continuously.The colonoscopy was  ?                          performed without difficulty. The patient tolerated  ?                          the procedure well. The quality of the bowel  ?                          preparation was fair. ?Scope In: 1:01:08 PM ?Scope Out: 1:14:58 PM ?Scope Withdrawal Time: 0 hours 8 minutes 7 seconds  ?Total Procedure Duration: 0 hours 13 minutes  50 seconds  ?Findings: ?     Hemorrhoids were found on perianal exam. ?     Internal hemorrhoids were found during retroflexion. The hemorrhoids  ?     were mild. ?     A few localized erosions were found in the mid rectum. ?     Many small and large-mouthed diverticula were found in the sigmoid  ?     colon, descending colon, transverse colon and ascending colon. ?     Clotted blood was found in the recto-sigmoid colon, in the sigmoid  ?     colon, in the descending colon, in the transverse colon and in the  ?     ascending colon. Views compromised intermittently throughout the colon. ?     The retroflexed view of the distal rectum and anal verge was normal and  ?     showed no anal or rectal abnormalities. ?Impression:               - Preparation of the colon was fair. ?                          - Hemorrhoids found on perianal exam. ?                           - Internal hemorrhoids. ?                          - A few erosions in the mid rectum. ?                          - Diverticulosis in the sigmoid colon, in the  ?                          descending colon, in the transverse colon and in  ?                          the ascending colon. ?                          - Blood in the recto-sigmoid colon, in the sigmoid  ?                          colon, in the descending colon, in the transverse  ?                          colon and in the ascending colon. ?                          - The distal rectum and anal verge are normal on  ?                          retroflexion view. ?                          - Overall, suspect patient's acute bleeding is  ?  diverticular in nature, though specific causative  ?                          diverticulum was not identified. ?Moderate Sedation: ?     None ?Recommendation:           - Return patient to hospital ward for ongoing care. ?                          - Clear liquid diet today. ?                          - Continue present medications. ?                          - Stay off Plavix until further notice. ?                          - If bleeding recurs, would repeat CT angiogram to  ?                          repeat attempts to localize site of bleeding. ?                          - [facility] will follow.Marland Kitchen ?Procedure Code(s):        --- Professional --- ?                          [phone], Colonoscopy, flexible; diagnostic, including  ?                          collection of specimen(s) by brushing or washing,  ?                          when performed (separate procedure) ?Diagnosis Code(s):        --- Professional --- ?                          W54.6, Other hemorrhoids ?                          K62.6, Ulcer of anus and rectum ?                          K92.2, Gastrointestinal hemorrhage, unspecified ?                          K92.1, Melena (includes Hematochezia) ?                           K57.30, Diverticulosis of large intestine without  ?                          perforation or abscess without bleeding ?                          R93.3, Abnormal findings on diagnostic imaging of  ?  other parts of digestive tract ?CPT copyright 2019 American Medical Association. All rights reserved. ?The codes documented in this report are preliminary and upon coder review may  ?be revised to meet current compliance requirements. ?Arta Silence, MD ?04/12/2022 1:23:39 PM ?This report has been signed electronically. ?Number of Addenda: 0 ?

## 2022-04-12 NOTE — Plan of Care (Signed)
?  Problem: Education: ?Goal: Knowledge of General Education information will improve ?Description: Including pain rating scale, medication(s)/side effects and non-pharmacologic comfort measures ?Outcome: Progressing ?  ?Problem: Health Behavior/Discharge Planning: ?Goal: Ability to manage health-related needs will improve ?Outcome: Progressing ?  ?Problem: Clinical Measurements: ?Goal: Respiratory complications will improve ?Outcome: Progressing ?  ?Pt is A/O x4. Pt verbalize plan of care and aware of upcoming labs. RR even and unlabored on room air.  ?

## 2022-04-12 NOTE — Progress Notes (Signed)
Echocardiogram ?2D Echocardiogram has been performed. ? ?Oneal Deputy Sola Margolis RDCS ?04/12/2022, 10:09 AM ?

## 2022-04-12 NOTE — Progress Notes (Signed)
Pharmacy Antibiotic Note ? ?David Goodman is a 81 y.o. male admitted on 04/11/2022 with GI bleed.  Pharmacy has been consulted for Zosyn dosing for colitis. ? ?Plan: ?Zosyn 3.375g IV q8h (4 hour infusion). ?Need for further dosage adjustment appears unlikely at present.   ? ?Will sign off at this time.  Please reconsult if a change in clinical status warrants re-evaluation of dosage. ? ? ? ?Height: '5\' 5"'$  (165.1 cm) ?Weight: 77.3 kg (170 lb 6.7 oz) ?IBW/kg (Calculated) : 61.5 ? ?Temp (24hrs), Avg:97.7 ?F (36.5 ?C), Min:97.4 ?F (36.3 ?C), Max:98.1 ?F (36.7 ?C) ? ?Recent Labs  ?Lab 04/11/22 ?1224 04/11/22 ?1903 04/11/22 ?2143  ?WBC 13.3*  --   --   ?CREATININE 1.13  --   --   ?LATICACIDVEN  --  1.2 1.2  ?  ?Estimated Creatinine Clearance: 50 mL/min (by C-G formula based on SCr of 1.13 mg/dL).   ? ?No Known Allergies ?  ? ?Thank you for allowing pharmacy to be a part of this patient?s care. ? ?Ewing Fandino, Toribio Harbour, PharmD ?04/12/2022 12:48 AM ? ?

## 2022-04-12 NOTE — Anesthesia Preprocedure Evaluation (Addendum)
Anesthesia Evaluation  ?Patient identified by MRN, date of birth, ID band ?Patient awake ? ? ? ?Reviewed: ?Allergy & Precautions, H&P , NPO status , Patient's Chart, lab work & pertinent test results, reviewed documented beta blocker date and time  ? ?Airway ?Mallampati: III ? ?TM Distance: >3 FB ?Neck ROM: full ? ? ? Dental ?no notable dental hx. ?(+) Teeth Intact, Dental Advisory Given ?  ?Pulmonary ?sleep apnea ,  ?  ?Pulmonary exam normal ?breath sounds clear to auscultation ? ? ? ? ? ? Cardiovascular ?Exercise Tolerance: Good ?hypertension, Pt. on medications ?+ CAD  ? ?Rhythm:regular Rate:Normal ? ? ?  ?Neuro/Psych ?PSYCHIATRIC DISORDERS Depression  Neuromuscular disease CVA   ? GI/Hepatic ?Neg liver ROS, hiatal hernia, PUD, GERD  Medicated,  ?Endo/Other  ?negative endocrine ROS ? Renal/GU ?negative Renal ROS  ?negative genitourinary ?  ?Musculoskeletal ? ?(+) Arthritis , Osteoarthritis,   ? Abdominal ?  ?Peds ? Hematology ? ?(+) Blood dyscrasia, anemia ,   ?Anesthesia Other Findings ? ? Reproductive/Obstetrics ?negative OB ROS ? ?  ? ? ? ? ? ? ? ? ? ? ? ? ? ?  ?  ? ? ? ? ? ? ? ?Anesthesia Physical ?Anesthesia Plan ? ?ASA: 4 and emergent ? ?Anesthesia Plan: MAC  ? ?Post-op Pain Management: Minimal or no pain anticipated  ? ?Induction:  ? ?PONV Risk Score and Plan: 1 and Treatment may vary due to age or medical condition ? ?Airway Management Planned: Natural Airway ? ?Additional Equipment: None ? ?Intra-op Plan:  ? ?Post-operative Plan:  ? ?Informed Consent: I have reviewed the patients History and Physical, chart, labs and discussed the procedure including the risks, benefits and alternatives for the proposed anesthesia with the patient or authorized representative who has indicated his/her understanding and acceptance.  ? ? ? ?Dental Advisory Given ? ?Plan Discussed with: CRNA and Anesthesiologist ? ?Anesthesia Plan Comments: (HPI: David Goodman is a 81 y.o. male with past  medical history of coronary artery disease, history of CHF, history of coronary artery disease with PCI in September 2022 currently on Plavix, history of pulmonary fibrosis, history of CVA, history of GI bleed in the past with bleeding scan showing bleeding from transverse and proximal descending colon followed by negative angiogram.  He also had colonoscopy on February 16, 2021 which showed evidence of recent bleeding and diverticulosis. )  ? ? ? ? ? ? ?Anesthesia Quick Evaluation ? ?

## 2022-04-12 NOTE — Interval H&P Note (Signed)
History and Physical Interval Note: ? ?04/12/2022 ?12:41 PM ? ?David Goodman  has presented today for surgery, with the diagnosis of hematochezia, anemia.  The various methods of treatment have been discussed with the patient and family. After consideration of risks, benefits and other options for treatment, the patient has consented to  Procedure(s): ?FLEXIBLE SIGMOIDOSCOPY (N/A) as a surgical intervention.  The patient's history has been reviewed, patient examined, no change in status, stable for surgery.  I have reviewed the patient's chart and labs.  Questions were answered to the patient's satisfaction.   ? ? ?Landry Dyke ? ? ?

## 2022-04-13 ENCOUNTER — Encounter (HOSPITAL_COMMUNITY): Payer: Self-pay | Admitting: Gastroenterology

## 2022-04-13 DIAGNOSIS — R578 Other shock: Secondary | ICD-10-CM | POA: Diagnosis not present

## 2022-04-13 DIAGNOSIS — K5791 Diverticulosis of intestine, part unspecified, without perforation or abscess with bleeding: Secondary | ICD-10-CM | POA: Diagnosis not present

## 2022-04-13 DIAGNOSIS — J8410 Pulmonary fibrosis, unspecified: Secondary | ICD-10-CM | POA: Diagnosis not present

## 2022-04-13 DIAGNOSIS — I5022 Chronic systolic (congestive) heart failure: Secondary | ICD-10-CM | POA: Diagnosis not present

## 2022-04-13 DIAGNOSIS — I10 Essential (primary) hypertension: Secondary | ICD-10-CM | POA: Diagnosis not present

## 2022-04-13 DIAGNOSIS — K922 Gastrointestinal hemorrhage, unspecified: Secondary | ICD-10-CM | POA: Diagnosis not present

## 2022-04-13 DIAGNOSIS — G8929 Other chronic pain: Secondary | ICD-10-CM | POA: Diagnosis present

## 2022-04-13 DIAGNOSIS — I110 Hypertensive heart disease with heart failure: Secondary | ICD-10-CM | POA: Diagnosis not present

## 2022-04-13 DIAGNOSIS — R3916 Straining to void: Secondary | ICD-10-CM

## 2022-04-13 DIAGNOSIS — I9589 Other hypotension: Secondary | ICD-10-CM | POA: Diagnosis not present

## 2022-04-13 DIAGNOSIS — R079 Chest pain, unspecified: Secondary | ICD-10-CM | POA: Diagnosis not present

## 2022-04-13 DIAGNOSIS — K626 Ulcer of anus and rectum: Secondary | ICD-10-CM | POA: Diagnosis not present

## 2022-04-13 DIAGNOSIS — E785 Hyperlipidemia, unspecified: Secondary | ICD-10-CM | POA: Diagnosis present

## 2022-04-13 DIAGNOSIS — K529 Noninfective gastroenteritis and colitis, unspecified: Secondary | ICD-10-CM | POA: Diagnosis present

## 2022-04-13 DIAGNOSIS — I2510 Atherosclerotic heart disease of native coronary artery without angina pectoris: Secondary | ICD-10-CM | POA: Diagnosis present

## 2022-04-13 DIAGNOSIS — K648 Other hemorrhoids: Secondary | ICD-10-CM | POA: Diagnosis present

## 2022-04-13 DIAGNOSIS — I25118 Atherosclerotic heart disease of native coronary artery with other forms of angina pectoris: Secondary | ICD-10-CM | POA: Diagnosis not present

## 2022-04-13 DIAGNOSIS — R7302 Impaired glucose tolerance (oral): Secondary | ICD-10-CM | POA: Diagnosis present

## 2022-04-13 DIAGNOSIS — Z955 Presence of coronary angioplasty implant and graft: Secondary | ICD-10-CM | POA: Diagnosis not present

## 2022-04-13 DIAGNOSIS — D62 Acute posthemorrhagic anemia: Secondary | ICD-10-CM | POA: Diagnosis not present

## 2022-04-13 DIAGNOSIS — J309 Allergic rhinitis, unspecified: Secondary | ICD-10-CM | POA: Diagnosis not present

## 2022-04-13 DIAGNOSIS — N401 Enlarged prostate with lower urinary tract symptoms: Secondary | ICD-10-CM | POA: Diagnosis not present

## 2022-04-13 DIAGNOSIS — E861 Hypovolemia: Secondary | ICD-10-CM | POA: Diagnosis not present

## 2022-04-13 DIAGNOSIS — Z79899 Other long term (current) drug therapy: Secondary | ICD-10-CM | POA: Diagnosis not present

## 2022-04-13 DIAGNOSIS — G4733 Obstructive sleep apnea (adult) (pediatric): Secondary | ICD-10-CM | POA: Diagnosis present

## 2022-04-13 DIAGNOSIS — H409 Unspecified glaucoma: Secondary | ICD-10-CM | POA: Diagnosis present

## 2022-04-13 DIAGNOSIS — F419 Anxiety disorder, unspecified: Secondary | ICD-10-CM | POA: Diagnosis present

## 2022-04-13 DIAGNOSIS — K760 Fatty (change of) liver, not elsewhere classified: Secondary | ICD-10-CM | POA: Diagnosis not present

## 2022-04-13 DIAGNOSIS — R55 Syncope and collapse: Secondary | ICD-10-CM | POA: Diagnosis present

## 2022-04-13 DIAGNOSIS — N400 Enlarged prostate without lower urinary tract symptoms: Secondary | ICD-10-CM | POA: Diagnosis not present

## 2022-04-13 DIAGNOSIS — I951 Orthostatic hypotension: Secondary | ICD-10-CM | POA: Diagnosis present

## 2022-04-13 DIAGNOSIS — K219 Gastro-esophageal reflux disease without esophagitis: Secondary | ICD-10-CM | POA: Diagnosis not present

## 2022-04-13 LAB — CBC
HCT: 20.1 % — ABNORMAL LOW (ref 39.0–52.0)
Hemoglobin: 7 g/dL — ABNORMAL LOW (ref 13.0–17.0)
MCH: 31 pg (ref 26.0–34.0)
MCHC: 34.8 g/dL (ref 30.0–36.0)
MCV: 88.9 fL (ref 80.0–100.0)
Platelets: 137 10*3/uL — ABNORMAL LOW (ref 150–400)
RBC: 2.26 MIL/uL — ABNORMAL LOW (ref 4.22–5.81)
RDW: 15.8 % — ABNORMAL HIGH (ref 11.5–15.5)
WBC: 8.7 10*3/uL (ref 4.0–10.5)
nRBC: 0 % (ref 0.0–0.2)

## 2022-04-13 LAB — HEMOGLOBIN AND HEMATOCRIT, BLOOD
HCT: 19.6 % — ABNORMAL LOW (ref 39.0–52.0)
HCT: 23.3 % — ABNORMAL LOW (ref 39.0–52.0)
HCT: 20.5 % — ABNORMAL LOW (ref 39.0–52.0)
Hemoglobin: 6.8 g/dL — CL (ref 13.0–17.0)
Hemoglobin: 8.1 g/dL — ABNORMAL LOW (ref 13.0–17.0)
Hemoglobin: 7.1 g/dL — ABNORMAL LOW (ref 13.0–17.0)

## 2022-04-13 LAB — BASIC METABOLIC PANEL
Anion gap: 4 — ABNORMAL LOW (ref 5–15)
BUN: 20 mg/dL (ref 8–23)
CO2: 19 mmol/L — ABNORMAL LOW (ref 22–32)
Calcium: 7.1 mg/dL — ABNORMAL LOW (ref 8.9–10.3)
Chloride: 115 mmol/L — ABNORMAL HIGH (ref 98–111)
Creatinine, Ser: 1.02 mg/dL (ref 0.61–1.24)
GFR, Estimated: >60 mL/min (ref >60)
Glucose, Bld: 105 mg/dL — ABNORMAL HIGH (ref 70–99)
Potassium: 3.3 mmol/L — ABNORMAL LOW (ref 3.5–5.1)
Sodium: 138 mmol/L (ref 135–145)

## 2022-04-13 LAB — PREPARE RBC (CROSSMATCH)

## 2022-04-13 LAB — MAGNESIUM: Magnesium: 1.6 mg/dL — ABNORMAL LOW (ref 1.7–2.4)

## 2022-04-13 MED ORDER — MAGNESIUM SULFATE 2 GM/50ML IV SOLN
2.0000 g | Freq: Once | INTRAVENOUS | Status: AC
  Administered 2022-04-13: 2 g via INTRAVENOUS
  Filled 2022-04-13: qty 50

## 2022-04-13 MED ORDER — POTASSIUM CHLORIDE 20 MEQ PO PACK
20.0000 meq | PACK | Freq: Once | ORAL | Status: AC
  Administered 2022-04-13: 20 meq via ORAL
  Filled 2022-04-13: qty 1

## 2022-04-13 MED ORDER — POTASSIUM CHLORIDE 10 MEQ/100ML IV SOLN
10.0000 meq | Freq: Once | INTRAVENOUS | Status: AC
Start: 2022-04-13 — End: 2022-04-13
  Administered 2022-04-13: 10 meq via INTRAVENOUS
  Filled 2022-04-13: qty 100

## 2022-04-13 NOTE — Progress Notes (Addendum)
Pt had many bouts of liquid bloody stools this shift ranging from small to large. Labs obtained; Mg replaced, Potassium replaced, 1 unit of blood given. During this time pt denied any pain, dizziness or lightheadedness. Pt did c/o of being cold once which was resolved with increase in thermostat.  ? ?2030- Small liquid bloody BM ? ?2103- Small liquid bloody BM ? ?2243- Medium liquid bloody BM ? ?2317- Small liquid bloody BM ? ?5/16 ? ?0033- Medium liquid bloody BM ? ?0119- Medium liquid bloody BM ?0135- 1 unit of RBCs transfused ? ?0211- Large liquid bloody BM ? ?0350- Large liquid bloody BM ?          Bloody bed pad and gown changed ?          1 unit RBCs completed  ? ?0540- Large liquid bloody BM, Tylenol given for headache  ?          Bloody gown and bed pad changed, 2in diameter clot noted on bed pad  ? ?0654- Medium liquid bloody BM  ?          Post H&H 6.8, MD notified  ?

## 2022-04-13 NOTE — Progress Notes (Signed)
Patient refused CPAP tonight due to constant up/down to bedside toilet. RN aware. ?

## 2022-04-13 NOTE — Progress Notes (Addendum)
E link asked to follow up on 8 pm H/H ?Result available now. Hemoglobin is 7.1 from 8.1 on last check ?D/w RN ?Has had large bloody bowel movements during the day but not recently  ?HR is in 70s. SBP 140s. On room air and no distress in camera and really no symptoms ?Notes mention plan for CTA / IR if active bleeding but RN notes bleeding has reduced significantly from what report she had from day team  ?Next CBC ordered for 5 am ? ?Plan: ?Will check another one around midnight, along with a BMP  ? ?Addendum at 12:05 am: ?I was notified of a syncopal event ?Seen on camera ?By that time the patient had already been moved to his bed ?RN mentioned that he got up to use the commode and while doing so and while passing stool, he passed out briefly ?Woke up with no intervention and denied any symptoms ?Monitor only showed some PVCs and no rhythm changes noted ?Patient fully awake, smiling, says he feels much better and has not had this occur at home ?Of note RN mentions this happened in the ER as well while he was straining for a BM ?Current HR in 80s. SBP 140s. No hypotension during the event either ?Bowel movement had some blood but only small amount. Has been reducing each time, per RN ?Labs were just sent, will add on a Mag as well and continue to monitor  ? ?Addendum at 12:15 am ?Hemoglobin 6.6 ?Has been a slow drop all day. No large acute bleeding per RN ?1 unit RBC to be transfused. BMP and mag are pending.  ? ?Addendum at 12:55 am ?K is 3.3 and mag is 1.9 ?40 meq IV Kcl and 2 gram mag to be given ? ?

## 2022-04-13 NOTE — Progress Notes (Signed)
CPAP removed due to pt frequent use of BSC.  ?

## 2022-04-13 NOTE — Progress Notes (Signed)
? ?NAME:  David Goodman, MRN:  [id number], DOB:  02-Feb-1941, LOS: 0 ?ADMISSION DATE:  04/11/2022, CONSULTATION DATE:  04/11/2022 ?REFERRING MD:  Cherylann Ratel, DO, CHIEF COMPLAINT:  GI bleed  ? ?History of Present Illness:  ?81 year old male hx peptic ulcer disease, GERD, HTN, HLD, BPH and anxiety who presents with rectal bleeding. He has recently been taking Naproxex twice a day for chronic knee pain. Today he noted bloody stool associated with weakness. In the ED had an episode of syncope after blood BM. He was given IVF 1L and improved. TRH admitted patient. ?  ?Called by [facility] 5/14. Patient had orthostatic hypotension with SBP in the 70s and additional bloody BM x 3. GI consulted. CTA and H/H ordered. Given additional 1L IVF. PCCM consulted ? ?Pertinent  Medical History  ?CAD on Plavix and ASA, peptic ulcer disease, GERD, HTN, HLD, BPH and anxiety ? ?Significant Hospital Events: ?Including procedures, antibiotic start and stop dates in addition to other pertinent events   ?5/14 Initially admitted to [facility]. PCCM consulted for hemorrhagic shock ?5/15 Multiple bloody stools overnight, currently on 103mg of levo ?5/16 10 documented bloody bowel movements overnight with 1 additional PRBC given. Despite transfusion hgb dropped to 6.8 this am. No tagged scan completed overnight  ? ?Interim History / Subjective:  ?Seen lying in bed frustrated over continued GI bleed. Ask if any other interventions could be taken to stop the bleeding  ? ?Objective   ?Blood pressure (!) 154/71, pulse 88, temperature (!) 97.5 ?F (36.4 ?C), temperature source Oral, resp. rate 18, height '5\' 5"'$  (1.651 m), weight 77.3 kg, SpO2 99 %. ?   ?   ? ?Intake/Output Summary (Last 24 hours) at 04/13/2022 0752 ?Last data filed at 04/13/2022 0501 ?Gross per 24 hour  ?Intake 1003.49 ml  ?Output 1500 ml  ?Net -496.51 ml  ? ? ?Filed Weights  ? 04/11/22 1632 04/11/22 1833  ?Weight: 76.9 kg 77.3 kg  ? ? ?Examination: ?General:Well appearing elderly male lying in bed in  NAD ?HEENT: [place]/AT, MM pink/moist, PERRL,  ?Neuro: Alert and oriented x3, non-focal ?CV: s1s2 regular rate and rhythm, no murmur, rubs, or gallops,  ?PULM:   Clear to ascultation, no increased work of breathing, no added breath sounds, on RA ?GI: soft, bowel sounds active in all 4 quadrants, non-tender, non-distended ?Extremities: warm/dry, no edema  ?Skin: no rashes or lesions ? ?Resolved Hospital Problem list   ? ? ?Assessment & Plan:  ?Hemorrhagic shock ?Acute blood loss anemia ?Active GIB ?Syncope ?-Multiple bloody stools overnight, S/P 3 units PRBC since admit  ?-Hgb dropped to 6.8 am of 5/16 ?P: ? ?GI following appreciate assistance ?2 additional units PRBC now  ?Need to follow bleeding closely for need of possible tagged scan  ?Trend CBC  ?Off pressors  ?Transfuse per protocol  ?Hgb goak > 7 ?Wean pressors for MAP goal > 65 ?PPI BID ?NPO ?Maintain 2 lage bore PIVs ?  ?CAD ?Bradycardia ?HTN ?Chronic systolic heart failure ?-ECHO 5/15 EF 40-45% with global hypokinesis and grade 1 diastolic dysfunction  ?P: ?Continue to hold home ASA and Plavix  ?Continuous telemetry  ?Strict intake and output  ?Daily weight to assess volume status ?  ?OSA ?P: ?Home CPAP  ? ?Best Practice (right click and "Reselect all SmartList Selections" daily)  ? ?Diet/type: NPO ?DVT prophylaxis: SCD ?GI prophylaxis: PPI ?Lines: N/A ?Foley:  N/A ?Code Status:  full code ?Last date of multidisciplinary goals of care discussion: Continue to update patient daily  ?  ?  Critical care time:   ? ?Performed by: Garrie Woodin D. Harris ? ?Total critical care time: 39 minutes ? ?Critical care time was exclusive of separately billable procedures and treating other patients. ? ?Critical care was necessary to treat or prevent imminent or life-threatening deterioration. ? ?Critical care was time spent personally by me on the following activities: development of treatment plan with patient and/or surrogate as well as nursing, discussions with consultants,  evaluation of patient's response to treatment, examination of patient, obtaining history from patient or surrogate, ordering and performing treatments and interventions, ordering and review of laboratory studies, ordering and review of radiographic studies, pulse oximetry and re-evaluation of patient's condition. ? ?Laron Boorman D. Harris, NP-C ?[facility] ?Personal contact information can be found on Amion  ?04/13/2022, 7:52 AM ? ? ? ? ? ? ?

## 2022-04-13 NOTE — Progress Notes (Signed)
[facility] ? ?PROGRESS NOTE ? ?David Goodman NWG:[id number] DOB: 01-03-1941 DOA: 04/11/2022 ?PCP: Haydee Salter, MD ? ? ?Brief HPI:   ? ?81 year old male with medical history of glaucoma, GERD, hypertension, hyperlipidemia, CAD, s/p PCI in September 2022, pulmonary fibrosis BPH, anxiety presented with rectal bleeding.  [facility] was consulted.  Plavix was held.  Patient also had syncopal episode while in the triage.  Patient takes naproxen on a daily basis.  CT angiogram for GI bleed was obtained yesterday which showed delayed mucosal blush in the region of rectum with some circumferential wall thickening consistent with focal colitis with mild hemorrhage.   ? ?Patient became hypotensive last night with SBP in 70s with additional bloody BMs x3.  PCCM was consulted and patient started on Levophed pressor support. ? ? ? ?Subjective  ? ?Patient seen and examined, off Levophed.  Had multiple small bloody BMs last night.  Hemoglobin dropped to 6.8 this AM.  2 units PRBC ordered. ? ? Assessment/Plan:  ? ? ?Hemorrhagic shock ?-Resolved ?-Secondary to ongoing GI bleed ?-Transferred to ICU, started on Levophed ?-S/p 1 unit PRBC ?-Initially required Levophed which has been transitioned off ? ?Syncope ?-Echocardiogram obtained today shows EF of 40 to 45%, left ventricle shows global hypokinesis ?-Grade 1 diastolic dysfunction ? ?Hypokalemia ?-Potassium was 3.3 ?-We will replace potassium and follow BMP in am ? ?GI bleed ?-Patient underwent colonoscopy yesterday ?-Found to have internal hemorrhoids and diverticulosis ?-No clear source of bleeding identified ?-GI recommends repeating CTA to localize site of GI bleed ?-Continue Protonix 40 mg IV every 12 hours ?-Patient had multiple small bloody BMs last night. ?-We will obtain CT angiogram to assess GI bleed if bleeding recurs. ? ?Acute blood loss anemia ?-Hemoglobin dropped to 6.8 this morning ?-2 units PRBC ordered ?-Follow CBC BMP ? ?CAD s/p PCI ?-Plavix and  aspirin are currently on hold ? ?Chronic systolic CHF ?-Stable ? ?Obstructive sleep apnea ?-Continue CPAP nightly ? ? ? ? ?Medications ? ?  ? chlorhexidine  15 mL Mouth Rinse BID  ? Chlorhexidine Gluconate Cloth  6 each Topical Daily  ? latanoprost  1 drop Both Eyes QHS  ? mouth rinse  15 mL Mouth Rinse q12n4p  ? pantoprazole (PROTONIX) IV  40 mg Intravenous Q12H  ? rosuvastatin  20 mg Oral Daily  ? ? ? Data Reviewed:  ? ?CBG: ? ?Recent Labs  ?Lab 04/11/22 ?1211  ?GLUCAP 144*  ? ? ?SpO2: 100 % ?O2 Flow Rate (L/min): 4 L/min  ? ? ?Vitals:  ? 04/13/22 0830 04/13/22 0845 04/13/22 0900 04/13/22 1056  ?BP: (!) 142/59  (!) 142/59 (!) 125/59  ?Pulse: 87 86 91 84  ?Resp: 18 (!) 24 (!) 23 20  ?Temp: 98.1 ?F (36.7 ?C)   98.5 ?F (36.9 ?C)  ?TempSrc:      ?SpO2: 98% 100% 100%   ?Weight:      ?Height:      ? ? ? ? ?Data Reviewed: ? ?Basic Metabolic Panel: ?Recent Labs  ?Lab 04/11/22 ?1224 04/12/22 ?5784 04/12/22 ?6962 04/12/22 ?2347  ?NA 137 134* 136 138  ?K 4.2 3.8 3.4* 3.3*  ?CL 108 112* 113* 115*  ?CO2 22 18* 18* 19*  ?GLUCOSE 129* 133* 128* 105*  ?BUN 25* 30* 27* 20  ?CREATININE 1.13 0.86 0.90 1.02  ?CALCIUM 8.8* 7.0* 6.9* 7.1*  ?MG  --  1.7  --  1.6*  ?PHOS  --  3.2  --   --   ? ? ?CBC: ?Recent Labs  ?  Lab 04/11/22 ?1224 04/11/22 ?1758 04/12/22 ?4196 04/12/22 ?2229 04/12/22 ?1115 04/12/22 ?1635 04/12/22 ?7989 04/13/22 ?2119  ?WBC 13.3*  --  14.2* 10.1  --   --  8.7  --   ?NEUTROABS 9.7*  --  10.9*  --   --   --   --   --   ?HGB 12.9*   < > 9.0* 9.1* 8.7* 7.7* 7.0* 6.8*  ?HCT 37.2*   < > 26.0* 26.1* 24.2* 21.8* 20.1* 19.6*  ?MCV 87.5  --  90.3 87.0  --   --  88.9  --   ?PLT 263  --  185 154  --   --  137*  --   ? < > = values in this interval not displayed.  ? ? ?LFT ?Recent Labs  ?Lab 04/11/22 ?1224 04/12/22 ?4174 04/12/22 ?0814  ?AST 24 16 14*  ?ALT '20 14 14  '$ ?ALKPHOS 45 30* 29*  ?BILITOT 0.9 1.3* 1.2  ?PROT 6.6 4.5* 4.3*  ?ALBUMIN 4.0 2.7* 2.7*  ? ?  ?Antibiotics: ?Anti-infectives (From admission, onward)  ? ? Start      Dose/Rate Route Frequency Ordered Stop  ? 04/12/22 0200  piperacillin-tazobactam (ZOSYN) IVPB 3.375 g       ? 3.375 g ?12.5 mL/hr over 240 Minutes Intravenous Every 8 hours 04/12/22 0045    ? ?  ? ? ? ?DVT prophylaxis: SCDs ? ?Code Status: Full code ? ?Family Communication: No family at bedside ? ? ?CONSULTS gastroenterology, PCCM ? ? ?Objective  ? ? ?Physical Examination: ? ?General-appears in no acute distress ?Heart-S1-S2, regular, no murmur auscultated ?Lungs-clear to auscultation bilaterally, no wheezing or crackles auscultated ?Abdomen-soft, nontender, no organomegaly ?Extremities-no edema in the lower extremities ?Neuro-alert, oriented x3, no focal deficit noted ? ?Status is: Inpatient:   ? ? ? ?  ? ? ?David Goodman ?  ?[facility] ?If 7PM-7AM, please contact night-coverage at [email or web address], ?Office  [phone] ? ? ?04/13/2022, 11:02 AM  LOS: 0 days  ? ? ? ? ? ? ? ? ? ? ?  ?

## 2022-04-13 NOTE — Progress Notes (Signed)
Subjective: ?Hematochezia last night.  None for past few hours. ? ?Objective: ?Vital signs in last 24 hours: ?Temp:  [97.2 ?F (36.2 ?C)-99.4 ?F (37.4 ?C)] 98.5 ?F (36.9 ?C) (05/16 1056) ?Pulse Rate:  [45-93] 84 (05/16 1056) ?Resp:  [5-24] 20 (05/16 1056) ?BP: (65-154)/(34-86) 125/59 (05/16 1056) ?SpO2:  [96 %-100 %] 100 % (05/16 0900) ?Weight change:  ?Last BM Date : 04/13/22 ? ?PE: ?GEN:  NAD ?HEENT:  Dry mucous membranes, pale conjunctiva ?ABD:  Soft, non-tender ? ?Lab Results: ?CBC ?   ?Component Value Date/Time  ? WBC 8.7 04/12/2022 2347  ? RBC 2.26 (L) 04/12/2022 2347  ? HGB 6.8 (LL) 04/13/2022 0616  ? HGB 13.6 08/18/2021 1310  ? HCT 19.6 (L) 04/13/2022 0616  ? HCT 39.9 08/18/2021 1310  ? PLT 137 (L) 04/12/2022 2347  ? PLT 243 08/18/2021 1310  ? MCV 88.9 04/12/2022 2347  ? MCV 86 08/18/2021 1310  ? MCH 31.0 04/12/2022 2347  ? MCHC 34.8 04/12/2022 2347  ? RDW 15.8 (H) 04/12/2022 2347  ? RDW 16.2 (H) 08/18/2021 1310  ? LYMPHSABS 2.0 04/12/2022 0046  ? MONOABS 1.0 04/12/2022 0046  ? EOSABS 0.2 04/12/2022 0046  ? BASOSABS 0.0 04/12/2022 0046  ?CMP  ?   ?Component Value Date/Time  ? NA 138 04/12/2022 2347  ? NA 136 12/18/2021 0950  ? K 3.3 (L) 04/12/2022 2347  ? CL 115 (H) 04/12/2022 2347  ? CO2 19 (L) 04/12/2022 2347  ? GLUCOSE 105 (H) 04/12/2022 2347  ? BUN 20 04/12/2022 2347  ? BUN 21 12/18/2021 0950  ? CREATININE 1.02 04/12/2022 2347  ? CALCIUM 7.1 (L) 04/12/2022 2347  ? PROT 4.3 (L) 04/12/2022 0851  ? PROT 6.4 12/09/2021 0838  ? ALBUMIN 2.7 (L) 04/12/2022 0851  ? ALBUMIN 4.4 12/09/2021 0838  ? AST 14 (L) 04/12/2022 0851  ? ALT 14 04/12/2022 0851  ? ALKPHOS 29 (L) 04/12/2022 0851  ? BILITOT 1.2 04/12/2022 0851  ? BILITOT 0.4 12/09/2021 0838  ? GFRNONAA >60 04/12/2022 2347  ? GFRAA >60 07/18/2020 0418  ? ? ?Assessment: ? ? Chronic anticoagulation, on hold. ? CAD with stenting. ? Acute blood loss anemia. ? 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  Prior hospitalizations for the  same. ? ?Plan: ? ? Anticoagulation on hold. ?Volume repletion, serial CBCs, transfuse if needed. ?If repeat rampant bleeding, repeat CT angiogram; there is NO benefit to doing CT angiogram unless patient is having rampant ACTIVE bleeding. ?Difficult case; continue medical management. ?Clear liquid diet only for now. ?[facility] will follow. ? ? Landry Dyke ?04/13/2022, 11:05 AM ? ? ?Cell [phone] ?If no answer or after 5 PM call [phone] ? ? ?

## 2022-04-14 DIAGNOSIS — R079 Chest pain, unspecified: Secondary | ICD-10-CM

## 2022-04-14 DIAGNOSIS — I10 Essential (primary) hypertension: Secondary | ICD-10-CM

## 2022-04-14 LAB — BASIC METABOLIC PANEL
Anion gap: 4 — ABNORMAL LOW (ref 5–15)
Anion gap: 5 (ref 5–15)
BUN: 10 mg/dL (ref 8–23)
BUN: 12 mg/dL (ref 8–23)
CO2: 19 mmol/L — ABNORMAL LOW (ref 22–32)
CO2: 20 mmol/L — ABNORMAL LOW (ref 22–32)
Calcium: 6.9 mg/dL — ABNORMAL LOW (ref 8.9–10.3)
Calcium: 7 mg/dL — ABNORMAL LOW (ref 8.9–10.3)
Chloride: 113 mmol/L — ABNORMAL HIGH (ref 98–111)
Chloride: 115 mmol/L — ABNORMAL HIGH (ref 98–111)
Creatinine, Ser: 0.92 mg/dL (ref 0.61–1.24)
Creatinine, Ser: 0.98 mg/dL (ref 0.61–1.24)
GFR, Estimated: >60 mL/min (ref >60)
GFR, Estimated: >60 mL/min (ref >60)
Glucose, Bld: 115 mg/dL — ABNORMAL HIGH (ref 70–99)
Glucose, Bld: 118 mg/dL — ABNORMAL HIGH (ref 70–99)
Potassium: 3.3 mmol/L — ABNORMAL LOW (ref 3.5–5.1)
Potassium: 3.9 mmol/L (ref 3.5–5.1)
Sodium: 137 mmol/L (ref 135–145)
Sodium: 139 mmol/L (ref 135–145)

## 2022-04-14 LAB — CBC
HCT: 18.9 % — ABNORMAL LOW (ref 39.0–52.0)
HCT: 21.8 % — ABNORMAL LOW (ref 39.0–52.0)
Hemoglobin: 6.6 g/dL — CL (ref 13.0–17.0)
Hemoglobin: 7.7 g/dL — ABNORMAL LOW (ref 13.0–17.0)
MCH: 30.8 pg (ref 26.0–34.0)
MCH: 30.9 pg (ref 26.0–34.0)
MCHC: 34.9 g/dL (ref 30.0–36.0)
MCHC: 35.3 g/dL (ref 30.0–36.0)
MCV: 87.6 fL (ref 80.0–100.0)
MCV: 88.3 fL (ref 80.0–100.0)
Platelets: 119 10*3/uL — ABNORMAL LOW (ref 150–400)
Platelets: 130 10*3/uL — ABNORMAL LOW (ref 150–400)
RBC: 2.14 MIL/uL — ABNORMAL LOW (ref 4.22–5.81)
RBC: 2.49 MIL/uL — ABNORMAL LOW (ref 4.22–5.81)
RDW: 15 % (ref 11.5–15.5)
RDW: 15.8 % — ABNORMAL HIGH (ref 11.5–15.5)
WBC: 7.5 10*3/uL (ref 4.0–10.5)
WBC: 8.5 10*3/uL (ref 4.0–10.5)
nRBC: 0 % (ref 0.0–0.2)
nRBC: 0 % (ref 0.0–0.2)

## 2022-04-14 LAB — PREPARE RBC (CROSSMATCH)

## 2022-04-14 LAB — MAGNESIUM: Magnesium: 1.9 mg/dL (ref 1.7–2.4)

## 2022-04-14 LAB — MRSA NEXT GEN BY PCR, NASAL: MRSA by PCR Next Gen: NOT DETECTED

## 2022-04-14 LAB — HEMOGLOBIN AND HEMATOCRIT, BLOOD
HCT: 20.7 % — ABNORMAL LOW (ref 39.0–52.0)
Hemoglobin: 7.3 g/dL — ABNORMAL LOW (ref 13.0–17.0)

## 2022-04-14 MED ORDER — SODIUM CHLORIDE 0.9% IV SOLUTION: Freq: Once | INTRAVENOUS | Status: AC

## 2022-04-14 MED ORDER — SODIUM CHLORIDE 0.9 % IV SOLN: INTRAVENOUS | Status: DC | PRN

## 2022-04-14 MED ORDER — POTASSIUM CHLORIDE 10 MEQ/100ML IV SOLN
10.0000 meq | INTRAVENOUS | Status: AC
  Administered 2022-04-14 (×4): 10 meq via INTRAVENOUS
  Filled 2022-04-14 (×4): qty 100

## 2022-04-14 MED ORDER — MAGNESIUM SULFATE 2 GM/50ML IV SOLN
2.0000 g | Freq: Once | INTRAVENOUS | Status: AC
  Administered 2022-04-14: 2 g via INTRAVENOUS
  Filled 2022-04-14: qty 50

## 2022-04-14 NOTE — Progress Notes (Addendum)
[facility] Progress Note ? ?Kayl Stogdill 81 y.o. 1941/08/11 ? ?Subjective: ?Patient seen and examined laying in bed.  Patient notes he had 4-5 bowel movements last night with bright red blood.  As of this morning he had no further bowel movements.  Per nurse report yesterday afternoon he had 10 large bowel movements with blood.  Patient did have near syncopal episode overnight but no decrease in blood pressure or tachycardia. ? ?ROS : Review of Systems  ?Gastrointestinal:  Positive for blood in stool and diarrhea. Negative for abdominal pain, constipation, heartburn, melena, nausea and vomiting.  ?Genitourinary:  Negative for dysuria and urgency.  ? ? ? ?Objective: ?Vital signs in last 24 hours: ?Vitals:  ? 04/14/22 1100 04/14/22 1220  ?BP:    ?Pulse: 74   ?Resp:    ?Temp:  98.1 ?F (36.7 ?C)  ?SpO2: 100%   ? ? ?Physical Exam: ? ?General:  Alert, cooperative, no distress, appears stated age, pallor  ?Head:  Normocephalic, without obvious abnormality, atraumatic  ?Eyes:  Anicteric sclera, EOM's intact, conjunctival pallor  ?Lungs:   Clear to auscultation bilaterally, respirations unlabored  ?Heart:  Regular rate and rhythm, S1, S2 normal  ?Abdomen:   Soft, non-tender, bowel sounds active all four quadrants,  no masses,   ?Extremities: Extremities normal, atraumatic, no  edema  ?Pulses: 2+ and symmetric  ? ? ?Lab Results: ?Recent Labs  ?  04/12/22 ?0814 04/12/22 ?4818 04/12/22 ?2347 04/13/22 ?2339 04/14/22 ?5631  ?NA 134*   < > 138 139 137  ?K 3.8   < > 3.3* 3.3* 3.9  ?CL 112*   < > 115* 115* 113*  ?CO2 18*   < > 19* 19* 20*  ?GLUCOSE 133*   < > 105* 118* 115*  ?BUN 30*   < > '20 12 10  '$ ?CREATININE 0.86   < > 1.02 0.98 0.92  ?CALCIUM 7.0*   < > 7.1* 6.9* 7.0*  ?MG 1.7  --  1.6* 1.9  --   ?PHOS 3.2  --   --   --   --   ? < > = values in this interval not displayed.  ? ?Recent Labs  ?  04/12/22 ?4970 04/12/22 ?2637  ?AST 16 14*  ?ALT 14 14  ?ALKPHOS 30* 29*  ?BILITOT 1.3* 1.2  ?PROT 4.5* 4.3*  ?ALBUMIN 2.7*  2.7*  ? ?Recent Labs  ?  04/12/22 ?8588 04/12/22 ?5027 04/13/22 ?2339 04/14/22 ?7412 04/14/22 ?1139  ?WBC 14.2*   < > 8.5 7.5  --   ?NEUTROABS 10.9*  --   --   --   --   ?HGB 9.0*   < > 6.6* 7.7* 7.3*  ?HCT 26.0*   < > 18.9* 21.8* 20.7*  ?MCV 90.3   < > 88.3 87.6  --   ?PLT 185   < > 130* 119*  --   ? < > = values in this interval not displayed.  ? ?Recent Labs  ?  04/12/22 ?8786 04/12/22 ?7672  ?LABPROT 16.9* 17.1*  ?INR 1.4* 1.4*  ? ? ? ? ?Assessment ?Hematochezia ?Lower GI bleed ?Anemia ?  ?Hemoglobin remained stable at 7.3 today after 7 units packed red blood cell. ?PT 17.1 INR 1.4 ?  ?Patient continues to have bright red blood and large watery bowel movements.  Denies history of diverticular bleed.  Currently on Plavix and aspirin, last dose of Plavix 5/14. ?  ?CT angio GI bleed 04/11/2022 ?VASCULAR  ?Delayed mucosal blush in the region of the  rectum with some ?circumferential wall thickening consistent with focal colitis with ?mild hemorrhage. ?  ?NON-VASCULAR ?Fatty liver. ?Prominent soft tissue at the region of the anus which may represent ?external hemorrhoids. Correlate with physical exam. ? ? ?Plan: ?Continue supportive care ?Recommend CTA when actively bleeding.  May need IR consultation if CTA is positive, blood pressure decreases or patient starts to have tachycardia. ?Monitor hemoglobin transfuse if less than 7. ?[facility] will follow. ? ?Charlott Rakes PA-C ?04/14/2022, 12:47 PM ? ?Contact #  [phone]  ?

## 2022-04-14 NOTE — Progress Notes (Signed)
Patient continues to decline nocturnal PAP. Equipment remains on standby if he should change his mind. RT will continue to follow  ?

## 2022-04-14 NOTE — Hospital Course (Signed)
81 year old male with medical history of glaucoma, GERD, hypertension, hyperlipidemia, CAD, s/p PCI in September 2022, pulmonary fibrosis BPH, anxiety presented with rectal bleeding.  [facility] was consulted.  Plavix was held.  Patient also had syncopal episode while in the triage.  Patient takes naproxen on a daily basis.  CT angiogram for GI bleed was obtained yesterday which showed delayed mucosal blush in the region of rectum with some circumferential wall thickening consistent with focal colitis with mild hemorrhage.  ?

## 2022-04-14 NOTE — Progress Notes (Signed)
? ?NAME:  David Goodman, MRN:  [id number], DOB:  January 12, 1941, LOS: 1 ?ADMISSION DATE:  04/11/2022, CONSULTATION DATE:  04/11/2022 ?REFERRING MD:  Cherylann Ratel, DO, CHIEF COMPLAINT:  GI bleed  ? ?History of Present Illness:  ?81 year old male hx peptic ulcer disease, GERD, HTN, HLD, BPH and anxiety who presents with rectal bleeding. He has recently been taking Naproxex twice a day for chronic knee pain. Today he noted bloody stool associated with weakness. In the ED had an episode of syncope after blood BM. He was given IVF 1L and improved. [facility] admitted patient. ?  ?Called by [facility] 5/14. Patient had orthostatic hypotension with SBP in the 70s and additional bloody BM x 3. GI consulted. CTA and H/H ordered. Given additional 1L IVF. PCCM consulted ? ?Pertinent  Medical History  ?CAD on Plavix and ASA, peptic ulcer disease, GERD, HTN, HLD, BPH and anxiety ? ?Significant Hospital Events: ?Including procedures, antibiotic start and stop dates in addition to other pertinent events   ?5/14 Initially admitted to [facility]. PCCM consulted for hemorrhagic shock ?5/15 Multiple bloody stools overnight, currently on 30mg of levo ?5/16 10 documented bloody bowel movements overnight with 1 additional PRBC given. Despite transfusion hgb dropped to 6.8 this am. No tagged scan completed overnight  ?5/17 syncopal episode overnight during attempt to stool, no hypotension or or prolonged bradycardia seen he quickly awoke with no interventions needed  ? ?Interim History / Subjective:  ?Continues to report frustration over continued GI bleed.  ?Denise any pain  ? ?Objective   ?Blood pressure (!) 150/68, pulse 88, temperature 97.9 ?F (36.6 ?C), temperature source Oral, resp. rate 20, height '5\' 5"'$  (1.651 m), weight 79.3 kg, SpO2 99 %. ?   ?   ? ?Intake/Output Summary (Last 24 hours) at 04/14/2022 0742 ?Last data filed at 04/14/2022 0600 ?Gross per 24 hour  ?Intake 3039.77 ml  ?Output 1700 ml  ?Net 1339.77 ml  ? ? ?Filed Weights  ? 04/11/22 1632 04/11/22  1833 04/14/22 0458  ?Weight: 76.9 kg 77.3 kg 79.3 kg  ? ? ?Examination: ?General: Well appearing elderly male lying in bed in NAD ?HEENT: [place]/AT, MM pink/moist, PERRL,  ?Neuro: Alert and oriented x3, non-focal  ?CV: s1s2 regular rate and rhythm, no murmur, rubs, or gallops,  ?PULM:  Clear to ascultation, no increased work of breathing, no added breath sounds, on RA ?GI: soft, bowel sounds active in all 4 quadrants, non-tender, non-distended, tolerating clear liquid diet  ?Extremities: warm/dry, no edema  ?Skin: no rashes or lesions ? ?Resolved Hospital Problem list   ? ? ?Assessment & Plan:  ?Hemorrhagic shock ?Acute blood loss anemia ?Active GIB ?Syncope ?-Multiple bloody stools overnight, S/P 3 units PRBC since admit  ?-As of am 5/17 patient has required 7 units of PRBC ?P: ?GI following, appreciate assistance  ?Continues with moderate bleed requiring continued transfusion to maintain hemodynamic stability  ?Spoke with Dr. SHerbie [place]with IR evening of 5/17 and at that time reccommended against a angio/embolectomy unless his condition deteriorates ?Continue to trend CBC  ?Clear liquid diet per GI  ?Hgb goal > 7 ?Continue PPI BID  ?Maintain 2 large bore IVs ? ?CAD ?Bradycardia ?HTN ?Chronic systolic heart failure ?-ECHO 5/15 EF 40-45% with global hypokinesis and grade 1 diastolic dysfunction  ?P: ?Home ASA and Plavix remains on hold  ?Continuous telemetry  ?Strict intake and output ?Daily weight  ? ?OSA ?P: ?Continue home CPAP  ? ?Best Practice (right click and "Reselect all SmartList Selections" daily)  ? ?Diet/type:  NPO ?DVT prophylaxis: SCD ?GI prophylaxis: PPI ?Lines: N/A ?Foley:  N/A ?Code Status:  full code ?Last date of multidisciplinary goals of care discussion: Continue to update patient daily  ?  ?Critical care time:   ? ?Performed by: Alishea Beaudin D. Harris ? ?Total critical care time: 37 minutes ? ?Critical care time was exclusive of separately billable procedures and treating other patients. ? ?Critical care  was necessary to treat or prevent imminent or life-threatening deterioration. ? ?Critical care was time spent personally by me on the following activities: development of treatment plan with patient and/or surrogate as well as nursing, discussions with consultants, evaluation of patient's response to treatment, examination of patient, obtaining history from patient or surrogate, ordering and performing treatments and interventions, ordering and review of laboratory studies, ordering and review of radiographic studies, pulse oximetry and re-evaluation of patient's condition. ? ?Jordyan Hardiman D. Harris, NP-C ? Pulmonary & Critical Care ?Personal contact information can be found on Amion  ?04/14/2022, 7:42 AM ? ? ? ? ? ? ?

## 2022-04-14 NOTE — Progress Notes (Signed)
?  Progress Note ? ? ?Patient: David Goodman [id number]:[id number] DOB: 23-May-1941 DOA: 04/11/2022     1 ?DOS: the patient was seen and examined on 04/14/2022 ?  ?Brief hospital course: ?No notes on file ? ?Assessment and Plan: ?No notes have been filed under this hospital service. ?Service: Hospitalist ?Hemorrhagic shock ?-Secondary to ongoing GI bleed ?-Transferred to ICU, started on Levophed ?-S/p 1 unit PRBC ?-Initially required Levophed which has been transitioned off ?-Now resolved ?  ?Syncope ?-Echocardiogram obtained today shows EF of 40 to 45%, left ventricle shows global hypokinesis ?-Grade 1 diastolic dysfunction ?  ?Hypokalemia ?-Replaced ?  ?GI bleed ?-Patient underwent colonoscopy 5/15 with internal hemorrhoids and diverticulosis ?-No clear source of bleeding identified ?-Recent BRBPR noted ?-Continue Protonix 40 mg IV every 12 hours ?-Plan CT angiogram to assess GI bleed if bleeding recurs. ?-GI following ?  ?Acute blood loss anemia ?-Hemoglobin down to 6.6 on 5/16, now s/p 2 units PRBC's ?-Hgb overall stable at this time ?  ?CAD s/p PCI ?-Plavix and aspirin are currently on hold ?  ?Chronic systolic CHF ?-Stable ?  ?Obstructive sleep apnea ?-Continue CPAP nightly ?  ? ? ?  ? ?Subjective: Denies abd pain. Reports no further BRBPR this AM ? ?Physical Exam: ?Vitals:  ? 04/14/22 1200 04/14/22 1220 04/14/22 1300 04/14/22 1400  ?BP: (!) 183/92  (!) 129/42   ?Pulse: 79  90 73  ?Resp:    19  ?Temp:  98.1 ?F (36.7 ?C)    ?TempSrc:  Oral    ?SpO2: 100%  100% 99%  ?Weight:      ?Height:      ? ?General exam: Awake, laying in bed, in nad ?Respiratory system: Normal respiratory effort, no wheezing ?Cardiovascular system: regular rate, s1, s2 ?Gastrointestinal system: Soft, nondistended, positive BS ?Central nervous system: CN2-12 grossly intact, strength intact ?Extremities: Perfused, no clubbing ?Skin: Normal skin turgor, no notable skin lesions seen ?Psychiatry: Mood normal // no visual hallucinations  ? ?Data  Reviewed: ? ?Labs reviewed: Cr 0.92, hgb 7.7->7.3 ? ?Family Communication: Pt in room, family not at bedside ? ?Disposition: ?Status is: Inpatient ?Remains inpatient appropriate because: Severity of illness ? Planned Discharge Destination: Home ? ? ? ? ?Author: ?Marylu Lund, MD ?04/14/2022 3:23 PM ? ?For on call review [email or web address]  ?

## 2022-04-15 LAB — CBC
HCT: 19.7 % — ABNORMAL LOW (ref 39.0–52.0)
Hemoglobin: 6.9 g/dL — CL (ref 13.0–17.0)
MCH: 30.8 pg (ref 26.0–34.0)
MCHC: 35 g/dL (ref 30.0–36.0)
MCV: 87.9 fL (ref 80.0–100.0)
Platelets: 127 10*3/uL — ABNORMAL LOW (ref 150–400)
RBC: 2.24 MIL/uL — ABNORMAL LOW (ref 4.22–5.81)
RDW: 14.8 % (ref 11.5–15.5)
WBC: 7.9 10*3/uL (ref 4.0–10.5)
nRBC: 0 % (ref 0.0–0.2)

## 2022-04-15 LAB — BPAM RBC
Blood Product Expiration Date: 202305242359
Blood Product Expiration Date: 202306042359
Blood Product Expiration Date: 202306042359
Blood Product Expiration Date: 202306092359
Blood Product Expiration Date: 202306092359
Blood Product Expiration Date: 202306102359
Blood Product Expiration Date: 202306102359
ISSUE DATE / TIME: 202305141824
ISSUE DATE / TIME: 202305150046
ISSUE DATE / TIME: 202305150352
ISSUE DATE / TIME: 202305160127
ISSUE DATE / TIME: 202305160810
ISSUE DATE / TIME: 202305161103
ISSUE DATE / TIME: 202305170040
Unit Type and Rh: 6200
Unit Type and Rh: 6200
Unit Type and Rh: 6200
Unit Type and Rh: 6200
Unit Type and Rh: 6200
Unit Type and Rh: 6200
Unit Type and Rh: 6200

## 2022-04-15 LAB — TYPE AND SCREEN
ABO/RH(D): A POS
Antibody Screen: NEGATIVE
Unit division: 0
Unit division: 0
Unit division: 0
Unit division: 0
Unit division: 0
Unit division: 0
Unit division: 0

## 2022-04-15 LAB — HEMOGLOBIN AND HEMATOCRIT, BLOOD
HCT: 24.4 % — ABNORMAL LOW (ref 39.0–52.0)
Hemoglobin: 8.6 g/dL — ABNORMAL LOW (ref 13.0–17.0)

## 2022-04-15 LAB — PREPARE RBC (CROSSMATCH)

## 2022-04-15 MED ORDER — TAMSULOSIN HCL 0.4 MG PO CAPS
0.4000 mg | ORAL_CAPSULE | Freq: Every day | ORAL | Status: DC
  Administered 2022-04-15 – 2022-04-16 (×2): 0.4 mg via ORAL
  Filled 2022-04-15 (×2): qty 1

## 2022-04-15 MED ORDER — SODIUM CHLORIDE 0.9% IV SOLUTION: Freq: Once | INTRAVENOUS | Status: AC

## 2022-04-15 NOTE — Progress Notes (Signed)
Subjective: No bleeding in 24 hours. No abdominal pain.  Objective: Vital signs in last 24 hours: Temp:  [97.4 F (36.3 C)-98.8 F (37.1 C)] 97.8 F (36.6 C) (05/18 0824) Pulse Rate:  [65-90] 65 (05/18 0900) Resp:  [0-28] 20 (05/18 0900) BP: (116-183)/(38-92) 145/61 (05/18 0900) SpO2:  [97 %-100 %] 99 % (05/18 0900) Weight:  [76.7 kg] 76.7 kg (05/18 0500) Weight change: -2.6 kg Last BM Date : 04/15/22  PE: GEN:  NAD, elderly ABD:  Soft, non-tender SKIN:  Pale NEURO:  A/O, no encephalopathy  Lab Results: CBC    Component Value Date/Time   WBC 7.9 04/15/2022 0253   RBC 2.24 (L) 04/15/2022 0253   HGB 8.6 (L) 04/15/2022 1037   HGB 13.6 08/18/2021 1310   HCT 24.4 (L) 04/15/2022 1037   HCT 39.9 08/18/2021 1310   PLT 127 (L) 04/15/2022 0253   PLT 243 08/18/2021 1310   MCV 87.9 04/15/2022 0253   MCV 86 08/18/2021 1310   MCH 30.8 04/15/2022 0253   MCHC 35.0 04/15/2022 0253   RDW 14.8 04/15/2022 0253   RDW 16.2 (H) 08/18/2021 1310   LYMPHSABS 2.0 04/12/2022 0046   MONOABS 1.0 04/12/2022 0046   EOSABS 0.2 04/12/2022 0046   BASOSABS 0.0 04/12/2022 0046  CMP     Component Value Date/Time   NA 137 04/14/2022 0620   NA 136 12/18/2021 0950   K 3.9 04/14/2022 0620   CL 113 (H) 04/14/2022 0620   CO2 20 (L) 04/14/2022 0620   GLUCOSE 115 (H) 04/14/2022 0620   BUN 10 04/14/2022 0620   BUN 21 12/18/2021 0950   CREATININE 0.92 04/14/2022 0620   CALCIUM 7.0 (L) 04/14/2022 0620   PROT 4.3 (L) 04/12/2022 0851   PROT 6.4 12/09/2021 0838   ALBUMIN 2.7 (L) 04/12/2022 0851   ALBUMIN 4.4 12/09/2021 0838   AST 14 (L) 04/12/2022 0851   ALT 14 04/12/2022 0851   ALKPHOS 29 (L) 04/12/2022 0851   BILITOT 1.2 04/12/2022 0851   BILITOT 0.4 12/09/2021 0838   GFRNONAA >60 04/14/2022 0620   GFRAA >60 07/18/2020 0418    Assessment:   Chronic anticoagulation, on hold.  CAD with stenting.  Acute blood loss anemia.  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   Prior hospitalizations for the same.  Plan:   Happy to hear 24 hours without bleeding.  Continue to follow.  CBC daily, transfuse as needed. Anticoagulation on hold; would consider asking cardiology if patient absolutely has to restart Plavix upon hospital discharge. Advance diet to full liquids. [facility] will follow.   Landry Dyke 04/15/2022, 11:34 AM   Cell [phone] If no answer or after 5 PM call [phone]

## 2022-04-15 NOTE — Progress Notes (Signed)
  Progress Note   Patient: David Goodman WCH:[id number] DOB: 12-Jun-1941 DOA: 04/11/2022     2 DOS: the patient was seen and examined on 04/15/2022   Brief hospital course: 81 year old male with medical history of glaucoma, GERD, hypertension, hyperlipidemia, CAD, s/p PCI in September 2022, pulmonary fibrosis BPH, anxiety presented with rectal bleeding.  [facility] was consulted.  Plavix was held.  Patient also had syncopal episode while in the triage.  Patient takes naproxen on a daily basis.  CT angiogram for GI bleed was obtained yesterday which showed delayed mucosal blush in the region of rectum with some circumferential wall thickening consistent with focal colitis with mild hemorrhage.   Assessment and Plan: No notes have been filed under this hospital service. Service: Hospitalist Hemorrhagic shock -Secondary to ongoing GI bleed -Did initially require pressor support, now off -S/p 1 unit PRBC -shock resolved   Syncope -Echocardiogram obtained today shows EF of 40 to 45%, left ventricle shows global hypokinesis -Grade 1 diastolic dysfunction   Hypokalemia -Replaced   GI bleed -Patient underwent colonoscopy 5/15 with internal hemorrhoids and diverticulosis -No clear source of bleeding identified -Recent BRBPR noted, no longer bleeding -Discussed with Cardiology. Recommendation to continue at lease one antiplatelet regimen. Discussed with GI who recommends ASA given decreased GI bleed risk over plavix   Acute blood loss anemia -Hemoglobin down to 6.6 on 5/16, now s/p 2 units PRBC's -Hgb down to 6.9 this AM, now s/p 1 unit PRBC   CAD s/p PCI -Plavix and aspirin are currently on hold -Denies chest pains   Chronic systolic CHF -Stable   Obstructive sleep apnea -Continue CPAP nightly        Subjective: Without abd pain or further bleeding. Denies chest pain  Physical Exam: Vitals:   04/15/22 0824 04/15/22 0900 04/15/22 1100 04/15/22 1300  BP: (!) 140/54 (!) 145/61  (!)  143/81  Pulse: 72 65  70  Resp: (!) '23 20  19  '$ Temp: 97.8 F (36.6 C)  97.9 F (36.6 C)   TempSrc: Oral  Oral   SpO2: 98% 99%  98%  Weight:      Height:       General exam: Conversant, in no acute distress Respiratory system: normal chest rise, clear, no audible wheezing Cardiovascular system: regular rhythm, s1-s2 Gastrointestinal system: Nondistended, nontender, pos BS Central nervous system: No seizures, no tremors Extremities: No cyanosis, no joint deformities Skin: No rashes, no pallor Psychiatry: Affect normal // no auditory hallucinations   Data Reviewed:  Labs reviewed: Hg 8.6  Family Communication: Pt in room, family not at bedside  Disposition: Status is: Inpatient Remains inpatient appropriate because: Severity of illness  Planned Discharge Destination: Home     Author: Marylu Lund, MD 04/15/2022 3:13 PM  For on call review [email or web address]

## 2022-04-15 NOTE — Plan of Care (Signed)
  Problem: Health Behavior/Discharge Planning: Goal: Ability to manage health-related needs will improve Outcome: Progressing   Problem: Clinical Measurements: Goal: Ability to maintain clinical measurements within normal limits will improve 04/15/2022 2255 by Cristino Martes, RN Outcome: Progressing 04/15/2022 2255 by Cristino Martes, RN Outcome: Progressing Goal: Diagnostic test results will improve Outcome: Progressing   Problem: Activity: Goal: Risk for activity intolerance will decrease Outcome: Progressing   Problem: Elimination: Goal: Will not experience complications related to urinary retention Outcome: Progressing   Problem: Pain Managment: Goal: General experience of comfort will improve 04/15/2022 2255 by Cristino Martes, RN Outcome: Progressing 04/15/2022 2255 by Cristino Martes, RN Outcome: Progressing

## 2022-04-15 NOTE — Progress Notes (Signed)
Patient declines nocturnal CPAP 

## 2022-04-15 NOTE — Progress Notes (Signed)
      Discussed with primary cardiologist Dr Irish Lack, patent admitted for GI bleed requiring transfusion, likely from colonic diverticulosis. LAD stent 9/27/222, completed 6 month DAPT, recommend continue at least one antiplatelet agent long term, preferably Plavix, if GI felt bleeding risk is too high, ASA '81mg'$  is OK. OK to hold antiplatelets temporarily until GI bleeding is resolved. Please call if more questions. Thanks.

## 2022-04-16 LAB — CBC
HCT: 24.3 % — ABNORMAL LOW (ref 39.0–52.0)
Hemoglobin: 8.3 g/dL — ABNORMAL LOW (ref 13.0–17.0)
MCH: 30.6 pg (ref 26.0–34.0)
MCHC: 34.2 g/dL (ref 30.0–36.0)
MCV: 89.7 fL (ref 80.0–100.0)
Platelets: 144 10*3/uL — ABNORMAL LOW (ref 150–400)
RBC: 2.71 MIL/uL — ABNORMAL LOW (ref 4.22–5.81)
RDW: 14.4 % (ref 11.5–15.5)
WBC: 7.5 10*3/uL (ref 4.0–10.5)
nRBC: 0 % (ref 0.0–0.2)

## 2022-04-16 LAB — COMPREHENSIVE METABOLIC PANEL
ALT: 13 U/L (ref 0–44)
AST: 16 U/L (ref 15–41)
Albumin: 2.7 g/dL — ABNORMAL LOW (ref 3.5–5.0)
Alkaline Phosphatase: 27 U/L — ABNORMAL LOW (ref 38–126)
Anion gap: 5 (ref 5–15)
BUN: 6 mg/dL — ABNORMAL LOW (ref 8–23)
CO2: 22 mmol/L (ref 22–32)
Calcium: 7.9 mg/dL — ABNORMAL LOW (ref 8.9–10.3)
Chloride: 111 mmol/L (ref 98–111)
Creatinine, Ser: 1.1 mg/dL (ref 0.61–1.24)
GFR, Estimated: >60 mL/min (ref >60)
Glucose, Bld: 99 mg/dL (ref 70–99)
Potassium: 3.7 mmol/L (ref 3.5–5.1)
Sodium: 138 mmol/L (ref 135–145)
Total Bilirubin: 1 mg/dL (ref 0.3–1.2)
Total Protein: 4.5 g/dL — ABNORMAL LOW (ref 6.5–8.1)

## 2022-04-16 LAB — TYPE AND SCREEN
ABO/RH(D): A POS
Antibody Screen: NEGATIVE
Unit division: 0

## 2022-04-16 LAB — BPAM RBC
Blood Product Expiration Date: 202306142359
ISSUE DATE / TIME: 202305180534
Unit Type and Rh: 6200

## 2022-04-16 NOTE — TOC Initial Note (Signed)
Transition of Care [facility]) - Initial/Assessment Note    Patient Details  Name: David Goodman MRN: [id number] Date of Birth: 03-12-1941  Transition of Care [facility]) CM/SW Contact:    Leeroy Cha, RN Phone Number: 04/16/2022, 1:19 PM  Clinical Narrative:                   Transition of Care [facility]) Screening Note   Patient Details  Name: David Goodman Date of Birth: Apr 17, 1941   Transition of Care [facility]) CM/SW Contact:    Leeroy Cha, RN Phone Number: 04/16/2022, 1:19 PM    Transition of Care Department [facility]) has reviewed patient and no TOC needs have been identified at this time. We will continue to monitor patient advancement through interdisciplinary progression rounds. If new patient transition needs arise, please place a TOC consult.         Patient Goals and CMS Choice        Expected Discharge Plan and Services           Expected Discharge Date: 04/16/22                                    Prior Living Arrangements/Services                       Activities of Daily Living Home Assistive Devices/Equipment: Hearing aid, Dentures (specify type) ADL Screening (condition at time of admission) Patient's cognitive ability adequate to safely complete daily activities?: Yes Is the patient deaf or have difficulty hearing?: Yes (wears hearing aids) Does the patient have difficulty seeing, even when wearing glasses/contacts?: No Does the patient have difficulty concentrating, remembering, or making decisions?: No Patient able to express need for assistance with ADLs?: Yes Does the patient have difficulty dressing or bathing?: No Independently performs ADLs?: Yes (appropriate for developmental age) Does the patient have difficulty walking or climbing stairs?: No Weakness of Legs: None Weakness of Arms/Hands: None  Permission Sought/Granted                  Emotional Assessment              Admission diagnosis:  Vasovagal  syncope [R55] Rectal bleeding [K62.5] GIB (gastrointestinal bleeding) [K92.2] Chest pain, unspecified type [R07.9] GI bleed [K92.2] Patient Active Problem List   Diagnosis Date Noted   GI bleed 04/13/2022   GIB (gastrointestinal bleeding) 04/11/2022   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   Vivid dream 09/21/2021   Excessive daytime sleepiness 09/21/2021   Snoring 09/21/2021   Coronary artery disease involving native coronary artery of native heart 09/21/2021   SOBOE (shortness of breath on exertion) 09/21/2021   Coronary artery disease    HFrEF (heart failure with reduced ejection fraction) ([place])    Bell's palsy, left 06/05/2021   Cerebrovascular disease 06/05/2021   Hiatal hernia 05/29/2021   Depression, major, single episode, mild ([place]) 05/29/2021   Glaucoma 02/27/2021   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   Erectile dysfunction due to arterial insufficiency 02/27/2021   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 12/29/2020   Cough 12/17/2020   Hemorrhoids 07/17/2020   RLS (restless legs syndrome) 10/17/2019   Syncope 11/15/2018   Diverticulitis 05/25/2018   Osteoarthritis of right knee 12/13/2017   Osteoarthritis of left knee 12/13/2017   Testosterone deficiency 03/22/2017   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/27/2016   GERD (gastroesophageal reflux disease) 09/27/2016  BPH (benign prostatic hyperplasia) 06/18/2013   HLD (hyperlipidemia) 07/13/2007   Essential hypertension 07/13/2007   Allergic rhinitis 07/13/2007   Peptic ulcer disease 07/13/2007   History of nephrolithiasis 07/13/2007   PCP:  Haydee Salter, MD Pharmacy:   [facility] ([place], [place] [facility] [address] [place] [address] [place] [address] Phone: [phone] Fax: [phone]  [facility] ([facility] ) - [place], [place]  [address] Phone: [phone] Fax: [phone]  [facility] [place] [address] [place], [place] [address] [place] [address] Phone: [phone] Fax: [phone]  [place], [place] [address] Phone: [phone] Fax: [phone]     Social Determinants of Health (SDOH) Interventions    Readmission Risk Interventions     View : No data to display.

## 2022-04-16 NOTE — Progress Notes (Signed)
Subjective: No bleeding for 2 days. No abdominal pain. Tolerating diet.  Objective: Vital signs in last 24 hours: Temp:  [97.9 F (36.6 C)-98.3 F (36.8 C)] 97.9 F (36.6 C) (05/19 0800) Pulse Rate:  [53-86] 68 (05/19 0800) Resp:  [11-29] 16 (05/19 0800) BP: (131-164)/(53-82) 143/76 (05/19 0800) SpO2:  [95 %-100 %] 100 % (05/19 0800) Weight change:  Last BM Date : 04/15/22  PE: GEN:  NAD HEENT:  Pale conjunctiva; anicteric; [place]/AT NECK:  Supple NEURO:  No encephalopathy, A/O ABD:  Soft, non-tender  Lab Results: CBC    Component Value Date/Time   WBC 7.5 04/16/2022 0255   RBC 2.71 (L) 04/16/2022 0255   HGB 8.3 (L) 04/16/2022 0255   HGB 13.6 08/18/2021 1310   HCT 24.3 (L) 04/16/2022 0255   HCT 39.9 08/18/2021 1310   PLT 144 (L) 04/16/2022 0255   PLT 243 08/18/2021 1310   MCV 89.7 04/16/2022 0255   MCV 86 08/18/2021 1310   MCH 30.6 04/16/2022 0255   MCHC 34.2 04/16/2022 0255   RDW 14.4 04/16/2022 0255   RDW 16.2 (H) 08/18/2021 1310   LYMPHSABS 2.0 04/12/2022 0046   MONOABS 1.0 04/12/2022 0046   EOSABS 0.2 04/12/2022 0046   BASOSABS 0.0 04/12/2022 0046  CMP     Component Value Date/Time   NA 138 04/16/2022 0255   NA 136 12/18/2021 0950   K 3.7 04/16/2022 0255   CL 111 04/16/2022 0255   CO2 22 04/16/2022 0255   GLUCOSE 99 04/16/2022 0255   BUN 6 (L) 04/16/2022 0255   BUN 21 12/18/2021 0950   CREATININE 1.10 04/16/2022 0255   CALCIUM 7.9 (L) 04/16/2022 0255   PROT 4.5 (L) 04/16/2022 0255   PROT 6.4 12/09/2021 0838   ALBUMIN 2.7 (L) 04/16/2022 0255   ALBUMIN 4.4 12/09/2021 0838   AST 16 04/16/2022 0255   ALT 13 04/16/2022 0255   ALKPHOS 27 (L) 04/16/2022 0255   BILITOT 1.0 04/16/2022 0255   BILITOT 0.4 12/09/2021 0838   GFRNONAA >60 04/16/2022 0255   GFRAA >60 07/18/2020 0418   Assessment:   Chronic anticoagulation, on hold.  CAD with stenting.  Acute blood loss anemia.  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  Prior  hospitalizations for the same.  No bleeding x 48 hours.  Plan:   Per [facility] discussion with cardiology, will stop Plavix indefinitely and use aspirin monotherapy.    Restart Aspirin in 5 days (starting Thursday 04/22/22). Advance diet as tolerated.  OK to discharge home from GI perspective. Case reviewed with Hospitalist Service.   Landry Dyke 04/16/2022, 11:27 AM   Cell [phone] If no answer or after 5 PM call [phone]

## 2022-04-16 NOTE — Discharge Instructions (Signed)
Resume asprin on 04/22/22

## 2022-04-16 NOTE — Discharge Summary (Signed)
Physician Discharge Summary   Patient: David Goodman MRN: [id number] DOB: Mar 19, 1941  Admit date:     04/11/2022  Discharge date: 04/16/22  Discharge Physician: Marylu Lund   PCP: Haydee Salter, MD   Recommendations at discharge:    Follow up with PCP in 1-2 weeks  Discharge Diagnoses: Principal Problem:   GIB (gastrointestinal bleeding) Active Problems:   HLD (hyperlipidemia)   Essential hypertension   BPH (benign prostatic hyperplasia)   GERD (gastroesophageal reflux disease)   Syncope   Glaucoma   HFrEF (heart failure with reduced ejection fraction) (HCC)   Coronary artery disease   GI bleed  Resolved Problems:   * No resolved hospital problems. [facility] Course: 81 year old male with medical history of glaucoma, GERD, hypertension, hyperlipidemia, CAD, s/p PCI in September 2022, pulmonary fibrosis BPH, anxiety presented with rectal bleeding.  [facility] was consulted.  Plavix was held.  Patient also had syncopal episode while in the triage.  Patient takes naproxen on a daily basis.  CT angiogram for GI bleed was obtained yesterday which showed delayed mucosal blush in the region of rectum with some circumferential wall thickening consistent with focal colitis with mild hemorrhage.   Assessment and Plan: No notes have been filed under this hospital service. Service: Hospitalist  Hemorrhagic shock -Secondary to ongoing GI bleed -Did initially require pressor support, now off -shock resolved   Syncope -Echocardiogram obtained today shows EF of 40 to 45%, left ventricle shows global hypokinesis -Grade 1 diastolic dysfunction   Hypokalemia -Replaced   GI bleed with acute blood loss anemia -Patient underwent colonoscopy 5/15 with internal hemorrhoids and diverticulosis -No clear source of bleeding identified -Recent BRBPR noted, no longer bleeding -Discussed with Cardiology. Recommendation to continue at lease one antiplatelet regimen. Discussed with GI who  recommends ASA given decreased GI bleed risk over plavix   Acute blood loss anemia -Required 3 units PRBC's this visit -Hgb had since remained stable towards end of course -Clear for d/c per GI with ASA alone, to resume on 5/25   CAD s/p PCI -Plavix and aspirin are currently on hold -Denies chest pains   Chronic systolic CHF -Stable   Obstructive sleep apnea -Continue CPAP nightly        Consultants: GI, PCCM Procedures performed:   Disposition: Home Diet recommendation:  Cardiac diet DISCHARGE MEDICATION: Allergies as of 04/16/2022   No Known Allergies      Medication List     STOP taking these medications    clopidogrel 75 MG tablet Commonly known as: PLAVIX   naproxen 500 MG tablet Commonly known as: NAPROSYN       TAKE these medications    aspirin EC 81 MG tablet Take 81 mg by mouth at bedtime. Swallow whole.   cetirizine 10 MG tablet Commonly known as: ZYRTEC Take 1 tablet (10 mg total) by mouth daily. What changed:  when to take this reasons to take this   Entresto 24-26 MG Generic drug: sacubitril-valsartan Take 1 tablet by mouth 2 (two) times daily.   fluticasone 50 MCG/ACT nasal spray Commonly known as: FLONASE Place 1 spray into both nostrils 2 (two) times daily. What changed: how much to take   latanoprost 0.005 % ophthalmic solution Commonly known as: XALATAN Place 1 drop into both eyes at bedtime.   MAGNESIUM PO Take 400 mg by mouth at bedtime.   metoprolol succinate 25 MG 24 hr tablet Commonly known as: Toprol XL Take 1 tablet (25 mg total) by mouth daily.  multivitamin with minerals Tabs tablet Take 1 tablet by mouth daily.   nitroGLYCERIN 0.4 MG SL tablet Commonly known as: NITROSTAT Place 1 tablet (0.4 mg total) under the tongue every 5 (five) minutes as needed for chest pain.   pantoprazole 40 MG tablet Commonly known as: PROTONIX TAKE 1 TABLET BY MOUTH ONCE DAILY --PATIENT  TO  STOP  OMEPRAZOLE What changed:  See the new instructions.   PARoxetine 20 MG tablet Commonly known as: PAXIL Take 1 tablet (20 mg total) by mouth daily.   psyllium 58.6 % powder Commonly known as: METAMUCIL Take 1 packet by mouth daily.   REFRESH OP Place 1 drop into the left eye 2 (two) times daily as needed (dryness).   rosuvastatin 20 MG tablet Commonly known as: Crestor Take 1 tablet (20 mg total) by mouth daily.   tadalafil 20 MG tablet Commonly known as: Cialis Take 1 tablet (20 mg total) by mouth daily as needed for erectile dysfunction.   tamsulosin 0.4 MG Caps capsule Commonly known as: FLOMAX TAKE 1 CAPSULE BY MOUTH  DAILY   vitamin C 1000 MG tablet Take 1,000 mg by mouth 2 (two) times a week.        Follow-up Information     Haydee Salter, MD Follow up in 2 week(s).   Specialty: Family Medicine Why: Hospital follow up Contact information: [place] [address] [phone]         Jettie Booze, MD .   Specialties: Cardiology, Radiology, Interventional Cardiology Contact information: [address] [address] [phone]                Discharge Exam: Danley Danker Weights   04/11/22 1833 04/14/22 0458 04/15/22 0500  Weight: 77.3 kg 79.3 kg 76.7 kg   General exam: Awake, laying in bed, in nad Respiratory system: Normal respiratory effort, no wheezing Cardiovascular system: regular rate, s1, s2 Gastrointestinal system: Soft, nondistended, positive BS Central nervous system: CN2-12 grossly intact, strength intact Extremities: Perfused, no clubbing Skin: Normal skin turgor, no notable skin lesions seen Psychiatry: Mood normal // no visual hallucinations   Condition at discharge: fair  The results of significant diagnostics from this hospitalization (including imaging, microbiology, ancillary and laboratory) are listed below for reference.   Imaging Studies: ECHOCARDIOGRAM COMPLETE  Result Date: 04/12/2022     ECHOCARDIOGRAM REPORT   Patient Name:   David Goodman Date of Exam: 04/12/2022 Medical Rec #:  [id number]    Height:       65.0 in Accession #:    [id number]   Weight:       170.4 lb Date of Birth:  1941-09-13    BSA:          1.848 m Patient Age:    2 years     BP:           122/66 mmHg Patient Gender: M            HR:           66 bpm. Exam Location:  Inpatient Procedure: 2D Echo, Color Doppler and Cardiac Doppler Indications:    R55 Syncope  History:        Patient has prior history of Echocardiogram examinations, most                 recent 12/09/2021. Risk Factors:Hypertension, Dyslipidemia and                 Sleep  Apnea.  Sonographer:    Raquel Sarna Senior RDCS Referring Phys: [id number] Jonnie Finner  Sonographer Comments: Technically difficult study due to pulmonary fibrosis IMPRESSIONS  1. Left ventricular ejection fraction, by estimation, is 40 to 45%. The left ventricle has mildly decreased function. The left ventricle demonstrates global hypokinesis. There is mild asymmetric left ventricular hypertrophy. Left ventricular diastolic parameters are consistent with Grade I diastolic dysfunction (impaired relaxation).  2. Right ventricular systolic function is normal. The right ventricular size is normal.  3. The mitral valve is normal in structure. Mild mitral valve regurgitation. No evidence of mitral stenosis.  4. The aortic valve is tricuspid. Aortic valve regurgitation is not visualized. Aortic valve sclerosis/calcification is present, without any evidence of aortic stenosis.  5. The inferior vena cava is normal in size with greater than 50% respiratory variability, suggesting right atrial pressure of 3 mmHg. Comparison(s): The left ventricular function has improved slighlty in 11/2021 was 35-40% now 40-45%. FINDINGS  Left Ventricle: Left ventricular ejection fraction, by estimation, is 40 to 45%. The left ventricle has mildly decreased function. The left ventricle demonstrates global hypokinesis. The left  ventricular internal cavity size was normal in size. There is  mild asymmetric left ventricular hypertrophy. Left ventricular diastolic parameters are consistent with Grade I diastolic dysfunction (impaired relaxation). Right Ventricle: The right ventricular size is normal. No increase in right ventricular wall thickness. Right ventricular systolic function is normal. Left Atrium: Left atrial size was normal in size. Right Atrium: Right atrial size was normal in size. Pericardium: There is no evidence of pericardial effusion. Presence of epicardial fat layer. Mitral Valve: The mitral valve is normal in structure. Mild mitral valve regurgitation. No evidence of mitral valve stenosis. Tricuspid Valve: The tricuspid valve is normal in structure. Tricuspid valve regurgitation is trivial. No evidence of tricuspid stenosis. Aortic Valve: The aortic valve is tricuspid. Aortic valve regurgitation is not visualized. Aortic valve sclerosis/calcification is present, without any evidence of aortic stenosis. Aortic valve mean gradient measures 8.0 mmHg. Aortic valve peak gradient measures 15.2 mmHg. Aortic valve area, by VTI measures 2.02 cm. Pulmonic Valve: The pulmonic valve was normal in structure. Pulmonic valve regurgitation is not visualized. No evidence of pulmonic stenosis. Aorta: The aortic root is normal in size and structure. Venous: The inferior vena cava is normal in size with greater than 50% respiratory variability, suggesting right atrial pressure of 3 mmHg. IAS/Shunts: No atrial level shunt detected by color flow Doppler.  LEFT VENTRICLE PLAX 2D LVIDd:         5.50 cm   Diastology LVIDs:         3.80 cm   LV e' medial:    5.77 cm/s LV PW:         0.90 cm   LV E/e' medial:  10.2 LV IVS:        1.10 cm   LV e' lateral:   5.22 cm/s LVOT diam:     2.40 cm   LV E/e' lateral: 11.3 LV SV:         69 LV SV Index:   37 LVOT Area:     4.52 cm  RIGHT VENTRICLE RV S prime:     16.80 cm/s TAPSE (M-mode): 2.2 cm LEFT ATRIUM              Index        RIGHT ATRIUM           Index LA diam:  3.20 cm 1.73 cm/m   RA Area:     10.60 cm LA Vol (A2C):   46.6 ml 25.22 ml/m  RA Volume:   20.30 ml  10.98 ml/m LA Vol (A4C):   41.4 ml 22.40 ml/m LA Biplane Vol: 47.6 ml 25.76 ml/m  AORTIC VALVE AV Area (Vmax):    2.07 cm AV Area (Vmean):   2.02 cm AV Area (VTI):     2.02 cm AV Vmax:           195.00 cm/s AV Vmean:          133.000 cm/s AV VTI:            0.341 m AV Peak Grad:      15.2 mmHg AV Mean Grad:      8.0 mmHg LVOT Vmax:         89.40 cm/s LVOT Vmean:        59.400 cm/s LVOT VTI:          0.152 m LVOT/AV VTI ratio: 0.45  AORTA Ao Root diam: 4.00 cm Ao Asc diam:  3.60 cm MITRAL VALVE MV Area (PHT): 2.87 cm    SHUNTS MV Decel Time: 264 msec    Systemic VTI:  0.15 m MV E velocity: 59.10 cm/s  Systemic Diam: 2.40 cm MV A velocity: 73.30 cm/s MV E/A ratio:  0.81 Kardie Tobb DO Electronically signed by Berniece Salines DO Signature Date/Time: 04/12/2022/12:15:08 PM    Final    CT ANGIO GI BLEED  Result Date: 04/11/2022 CLINICAL DATA:  Rectal bleeding and hypotension EXAM: CTA ABDOMEN AND PELVIS WITHOUT AND WITH CONTRAST TECHNIQUE: Multidetector CT imaging of the abdomen and pelvis was performed using the standard protocol during bolus administration of intravenous contrast. Multiplanar reconstructed images and MIPs were obtained and reviewed to evaluate the vascular anatomy. RADIATION DOSE REDUCTION: This exam was performed according to the departmental dose-optimization program which includes automated exposure control, adjustment of the mA and/or kV according to patient size and/or use of iterative reconstruction technique. CONTRAST:  189m OMNIPAQUE IOHEXOL 350 MG/ML SOLN COMPARISON:  None Available. FINDINGS: VASCULAR Aorta: Precontrast images demonstrate atherosclerotic calcifications of the abdominal aorta. Post-contrast images demonstrate no aneurysmal dilatation or dissection. Celiac: Patent without evidence of aneurysm,  dissection, vasculitis or significant stenosis. SMA: Patent without evidence of aneurysm, dissection, vasculitis or significant stenosis. 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 Mild atherosclerotic calcifications are seen. IMA: Patent without evidence of aneurysm, dissection, vasculitis or significant stenosis. Inflow: Iliacs show atherosclerotic calcifications without focal stenosis or dissection. Veins: No specific venous abnormality is noted. Note is made of a left retroaortic renal vein. Review of the MIP images confirms the above findings. NON-VASCULAR Lower chest: No acute infiltrate or sizable effusion is seen. Hepatobiliary: Mild fatty infiltration of the liver is noted. The gallbladder is within normal limits. Pancreas: Unremarkable. No pancreatic ductal dilatation or surrounding inflammatory changes. Spleen: Normal in size without focal abnormality. Adrenals/Urinary Tract: Adrenal glands are within normal limits. Kidneys demonstrate a normal enhancement pattern bilaterally. Tiny subcentimeter cyst is noted within the right kidney. No obstructive changes are seen. The bladder is well distended. Stomach/Bowel: The appendix is within normal limits. Mild hyperemia is seen in the rectal mucosa with increase on delayed images consistent with slow rectal mucosal hemorrhage. Some circumferential wall thickening in the rectum is noted consistent with a degree of colitis. Prominent soft tissue is noted at the level of the anus which may represent external hemorrhoids. Correlate with the physical exam.  No other focal area of contrast pooling is noted within the colon to suggest acute hemorrhage. Scattered hyperdensities are noted on the pre contrast images likely related to ingested material. Small bowel and stomach show no acute abnormality. Lymphatic: No significant lymphadenopathy is noted. Reproductive: Prostate is unremarkable. Other: No abdominal wall hernia or abnormality. No abdominopelvic  ascites. Musculoskeletal: No acute bony abnormality noted. 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 IMPRESSION: VASCULAR Delayed mucosal blush in the region of the rectum with some circumferential wall thickening consistent with focal colitis with mild hemorrhage. No other significant vascular abnormality is noted. NON-VASCULAR Subcentimeter right Bosniak 1 benign simple cyst. No follow-up imaging is recommended. Radiology 2019; [phone], Bosniak Classification of Cystic Renal Masses, Version 2019. Fatty liver. Prominent soft tissue at the region of the anus which may represent external hemorrhoids. Correlate with physical exam. Electronically Signed   By: Inez Catalina M.D.   On: 04/11/2022 21:54    Microbiology: Results for orders placed or performed during the hospital encounter of 04/11/22  Culture, blood (Routine X 2) w Reflex to ID Panel     Status: None (Preliminary result)   Collection Time: 04/11/22 10:56 PM   Specimen: BLOOD LEFT HAND  Result Value Ref Range Status   Specimen Description   Final    BLOOD LEFT HAND Performed at [facility], [place] [address] [place], [place] [address]    Special Requests   Final    BOTTLES DRAWN AEROBIC ONLY Blood Culture adequate volume Performed at [place] [address], [place], [place] [address]    Culture   Final    NO GROWTH 4 DAYS Performed at [facility], [place] [address], [place], [place] [address]    Report Status PENDING  Incomplete  Culture, blood (Routine X 2) w Reflex to ID Panel     Status: None (Preliminary result)   Collection Time: 04/11/22 10:57 PM   Specimen: BLOOD RIGHT HAND  Result Value Ref Range Status   Specimen Description   Final    BLOOD RIGHT HAND Performed at [facility], [place] [address] [place], [place] [address]    Special Requests   Final    IN PEDIATRIC BOTTLE Blood Culture adequate volume Performed at [facility] [address]  [place], [place] [address]    Culture   Final    NO GROWTH 4 DAYS Performed at [facility], [place] [address], [place], [place] [address]    Report Status PENDING  Incomplete  MRSA Next Gen by PCR, Nasal     Status: None   Collection Time: 04/14/22  3:21 AM   Specimen: Nasal Mucosa; Nasal Swab  Result Value Ref Range Status   MRSA by PCR Next Gen NOT DETECTED NOT DETECTED Final    Comment: (NOTE) The GeneXpert MRSA Assay (FDA approved for NASAL specimens only), is one component of a comprehensive MRSA colonization surveillance program. It is not intended to diagnose MRSA infection nor to guide or monitor treatment for MRSA infections. Test performance is not FDA approved in patients less than 68 years old. Performed at [facility], [place] [address], [place], [place] [address]     Labs: CBC: Recent Labs  Lab 04/11/22 1224 04/11/22 1758 04/12/22 0046 04/12/22 3244 04/12/22 2347 04/13/22 0102 04/13/22 2339 04/14/22 0620 04/14/22 1139 04/15/22 0253 04/15/22 1037 04/16/22 0255  WBC 13.3*  --  14.2*   < > 8.7  --  8.5 7.5  --  7.9  --  7.5  NEUTROABS 9.7*  --  10.9*  --   --   --   --   --   --   --   --   --   HGB 12.9*   < > 9.0*   < > 7.0*   < > 6.6* 7.7* 7.3* 6.9* 8.6* 8.3*  HCT 37.2*   < > 26.0*   < > 20.1*   < > 18.9* 21.8* 20.7* 19.7* 24.4* 24.3*  MCV 87.5  --  90.3   < > 88.9  --  88.3 87.6  --  87.9  --  89.7  PLT 263  --  185   < > 137*  --  130* 119*  --  127*  --  144*   < > = values in this interval not displayed.   Basic Metabolic Panel: Recent Labs  Lab 04/12/22 0046 04/12/22 0851 04/12/22 2347 04/13/22 2339 04/14/22 0620 04/16/22 0255  NA 134* 136 138 139 137 138  K 3.8 3.4* 3.3* 3.3* 3.9 3.7  CL 112* 113* 115* 115* 113* 111  CO2 18* 18* 19* 19* 20* 22  GLUCOSE 133* 128* 105* 118* 115* 99  BUN 30* 27* '20 12 10 '$ 6*  CREATININE 0.86 0.90 1.02 0.98 0.92 1.10  CALCIUM 7.0* 6.9* 7.1* 6.9* 7.0* 7.9*  MG 1.7  --  1.6* 1.9  --   --    PHOS 3.2  --   --   --   --   --    Liver Function Tests: Recent Labs  Lab 04/11/22 1224 04/12/22 0046 04/12/22 0851 04/16/22 0255  AST 24 16 14* 16  ALT '20 14 14 13  '$ ALKPHOS 45 30* 29* 27*  BILITOT 0.9 1.3* 1.2 1.0  PROT 6.6 4.5* 4.3* 4.5*  ALBUMIN 4.0 2.7* 2.7* 2.7*   CBG: Recent Labs  Lab 04/11/22 1211  GLUCAP 144*    Discharge time spent: less than 30 minutes.  Signed: Marylu Lund, MD [facility] 04/16/2022

## 2022-04-16 NOTE — TOC Transition Note (Signed)
Transition of Care [facility]) - CM/SW Discharge Note   Patient Details  Name: David Goodman MRN: [id number] Date of Birth: December 03, 1940  [facility] CM/SW Contact:  Leeroy Cha, RN Phone Number: 04/16/2022, 1:19 PM   Clinical Narrative:     Pt dcd to home with no toc needs        Patient Goals and CMS Choice        Discharge Placement                       Discharge Plan and Services                                     Social Determinants of Health (SDOH) Interventions     Readmission Risk Interventions     View : No data to display.

## 2022-04-17 LAB — CULTURE, BLOOD (ROUTINE X 2)
Culture: NO GROWTH
Culture: NO GROWTH
Special Requests: ADEQUATE
Special Requests: ADEQUATE

## 2022-04-22 ENCOUNTER — Ambulatory Visit (INDEPENDENT_AMBULATORY_CARE_PROVIDER_SITE_OTHER): Payer: Medicare Other | Admitting: Family Medicine

## 2022-04-22 ENCOUNTER — Encounter: Payer: Self-pay | Admitting: Family Medicine

## 2022-04-22 VITALS — BP 124/68 | HR 73 | Temp 97.0°F | Ht 65.0 in | Wt 169.2 lb

## 2022-04-22 DIAGNOSIS — I5020 Unspecified systolic (congestive) heart failure: Secondary | ICD-10-CM | POA: Diagnosis not present

## 2022-04-22 DIAGNOSIS — K649 Unspecified hemorrhoids: Secondary | ICD-10-CM | POA: Diagnosis not present

## 2022-04-22 DIAGNOSIS — I10 Essential (primary) hypertension: Secondary | ICD-10-CM | POA: Diagnosis not present

## 2022-04-22 DIAGNOSIS — K625 Hemorrhage of anus and rectum: Secondary | ICD-10-CM

## 2022-04-22 LAB — CBC
HCT: 27.8 % — ABNORMAL LOW (ref 39.0–52.0)
Hemoglobin: 9.4 g/dL — ABNORMAL LOW (ref 13.0–17.0)
MCHC: 33.9 g/dL (ref 30.0–36.0)
MCV: 88.8 fl (ref 78.0–100.0)
Platelets: 277 10*3/uL (ref 150.0–400.0)
RBC: 3.13 Mil/uL — ABNORMAL LOW (ref 4.22–5.81)
RDW: 14.5 % (ref 11.5–15.5)
WBC: 6.9 10*3/uL (ref 4.0–10.5)

## 2022-04-22 LAB — BASIC METABOLIC PANEL
BUN: 22 mg/dL (ref 6–23)
CO2: 23 mEq/L (ref 19–32)
Calcium: 8.7 mg/dL (ref 8.4–10.5)
Chloride: 104 mEq/L (ref 96–112)
Creatinine, Ser: 0.92 mg/dL (ref 0.40–1.50)
GFR: 78.54 mL/min (ref >60.00)
Glucose, Bld: 98 mg/dL (ref 70–99)
Potassium: 4.3 mEq/L (ref 3.5–5.1)
Sodium: 136 mEq/L (ref 135–145)

## 2022-04-22 NOTE — Progress Notes (Signed)
[facility] [address] [place] [address] Dept: [phone] Dept Fax: [phone]  Office Visit  Subjective:    [name], male    DOB: 28-Sep-1941, 81 y.o..   MRN: [id number]  Chief Complaint  Patient presents with   Hospitalization Follow-up    F/u form hospital 04/11/22 for Diverticulitis.  No energy, BP at home 118/55-118/60    History of Present Illness:  Patient is in today for hospital follow-up. David Goodman was admitted at [facility] from 5/14-5/19 with  an acute GI bleed. He was on Plavix, which was held. He had findings of rectal colitis and internal hemorrhoids. He apparently have a episode of syncope possibly from hemorrhagic shock, which required pressors. He has a history of HFrEF which likely complicated matters.  David Goodman notes that he continues to feel a bit weak. He has been holding off on taking either his metoprolol or his Entresto, as his blood pressures at home have been on the low side (118/57 average). He had a concern over the Entresto anyway, due to his co-pay of $274 for a 3 month supply. He has a follow-up appointment with cardiology in June.  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   Vivid dream 09/21/2021   Excessive daytime sleepiness 09/21/2021   Snoring 09/21/2021   Coronary artery disease involving native coronary artery of native heart 09/21/2021   SOBOE (shortness of breath on exertion) 09/21/2021   Coronary artery disease    HFrEF (heart failure with reduced ejection fraction) ([place])    Bell's palsy, left 06/05/2021   Cerebrovascular disease 06/05/2021   Hiatal hernia 05/29/2021   Depression, major, single episode, mild ([place]) 05/29/2021   Glaucoma 02/27/2021   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   Erectile dysfunction due to  arterial insufficiency 02/27/2021   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 12/29/2020   Hemorrhoids 07/17/2020   RLS (restless legs syndrome) 10/17/2019   Syncope 11/15/2018   Diverticulitis 05/25/2018   Osteoarthritis of right knee 12/13/2017   Osteoarthritis of left knee 12/13/2017   Testosterone deficiency 03/22/2017   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/27/2016   GERD (gastroesophageal reflux disease) 09/27/2016   BPH (benign prostatic hyperplasia) 06/18/2013   HLD (hyperlipidemia) 07/13/2007   Essential hypertension 07/13/2007   Allergic rhinitis 07/13/2007   Peptic ulcer disease 07/13/2007   History of nephrolithiasis 07/13/2007   Past Surgical History:  Procedure Laterality Date   BROW LIFT Bilateral 11/10/2020   Procedure: BLEPHAROPLASTY;  Surgeon: [name], MD;  Location: [facility];  Service: Plastics;  Laterality: Bilateral;  1 hour   COLONOSCOPY N/A 04/12/2022   Procedure: COLONOSCOPY;  Surgeon: Arta Silence, MD;  Location: [facility];  Service: Gastroenterology;  Laterality: N/A;   COLONOSCOPY WITH PROPOFOL N/A 02/17/2019   Procedure: COLONOSCOPY WITH PROPOFOL;  Surgeon: Ronnette Juniper, MD;  Location: [facility];  Service: Gastroenterology;  Laterality: N/A;   CORONARY STENT INTERVENTION N/A 08/25/2021   Procedure: CORONARY STENT INTERVENTION;  Surgeon: Jettie Booze, MD;  Location: [facility];  Service: Cardiovascular;  Laterality: N/A;   HYDROCELE EXCISION Right 05/15/2003   INTRAVASCULAR LITHOTRIPSY  08/25/2021   Procedure: INTRAVASCULAR LITHOTRIPSY;  Surgeon: Jettie Booze, MD;  Location: [facility];  Service: Cardiovascular;;   INTRAVASCULAR ULTRASOUND/IVUS N/A 08/25/2021   Procedure: Intravascular Ultrasound/IVUS;  Surgeon: Jettie Booze,  MD;  Location: [facility];  Service: Cardiovascular;  Laterality: N/A;   IR ANGIOGRAM VISCERAL SELECTIVE  02/16/2019   IR ANGIOGRAM VISCERAL SELECTIVE   02/16/2019   IR ANGIOGRAM VISCERAL SELECTIVE  02/16/2019   IR US GUIDE VASC ACCESS RIGHT  02/16/2019   LEFT HEART CATH AND CORONARY ANGIOGRAPHY N/A 07/28/2021   Procedure: LEFT HEART CATH AND CORONARY ANGIOGRAPHY;  Surgeon: Jettie Booze, MD;  Location: [facility];  Service: Cardiovascular;  Laterality: N/A;   LEFT HEART CATH AND CORONARY ANGIOGRAPHY N/A 08/25/2021   Procedure: LEFT HEART CATH AND CORONARY ANGIOGRAPHY;  Surgeon: Jettie Booze, MD;  Location: [facility];  Service: Cardiovascular;  Laterality: N/A;   ROTATOR CUFF REPAIR     SPERMATOCELECTOMY Right 05/15/2003   Family History  Problem Relation Age of Onset   Cancer Brother    Outpatient Medications Prior to Visit  Medication Sig Dispense Refill   Ascorbic Acid (VITAMIN C) 1000 MG tablet Take 1,000 mg by mouth 2 (two) times a week.     cetirizine (ZYRTEC) 10 MG tablet Take 1 tablet (10 mg total) by mouth daily. (Patient taking differently: Take 10 mg by mouth daily as needed for allergies.) 30 tablet 11   fluticasone (FLONASE) 50 MCG/ACT nasal spray Place 1 spray into both nostrils 2 (two) times daily. (Patient taking differently: Place 2 sprays into both nostrils 2 (two) times daily.) 15.8 mL 6   latanoprost (XALATAN) 0.005 % ophthalmic solution Place 1 drop into both eyes at bedtime. 7.5 mL 1   MAGNESIUM PO Take 400 mg by mouth at bedtime.     Multiple Vitamin (MULTIVITAMIN WITH MINERALS) TABS tablet Take 1 tablet by mouth daily.     nitroGLYCERIN (NITROSTAT) 0.4 MG SL tablet Place 1 tablet (0.4 mg total) under the tongue every 5 (five) minutes as needed for chest pain. 25 tablet 3   pantoprazole (PROTONIX) 40 MG tablet TAKE 1 TABLET BY MOUTH ONCE DAILY --PATIENT  TO  STOP  OMEPRAZOLE (Patient taking differently: Take 40 mg by mouth daily.) 30 tablet 0   Polyvinyl Alcohol-Povidone (REFRESH OP) Place 1 drop into the left eye 2 (two) times daily as needed (dryness).     psyllium (METAMUCIL) 58.6 %  powder Take 1 packet by mouth daily.     rosuvastatin (CRESTOR) 20 MG tablet Take 1 tablet (20 mg total) by mouth daily. 30 tablet 11   tadalafil (CIALIS) 20 MG tablet Take 1 tablet (20 mg total) by mouth daily as needed for erectile dysfunction. 30 tablet 2   tamsulosin (FLOMAX) 0.4 MG CAPS capsule TAKE 1 CAPSULE BY MOUTH  DAILY (Patient taking differently: Take 0.4 mg by mouth daily.) 90 capsule 3   aspirin EC 81 MG tablet Take 81 mg by mouth at bedtime. Swallow whole.     metoprolol succinate (TOPROL XL) 25 MG 24 hr tablet Take 1 tablet (25 mg total) by mouth daily. (Patient not taking: Reported on 04/22/2022) 90 tablet 3   sacubitril-valsartan (ENTRESTO) 24-26 MG Take 1 tablet by mouth 2 (two) times daily. (Patient not taking: Reported on 04/22/2022) 180 tablet 2   PARoxetine (PAXIL) 20 MG tablet Take 1 tablet (20 mg total) by mouth daily. (Patient not taking: Reported on 04/11/2022) 90 tablet 3   No facility-administered medications prior to visit.   No Known Allergies    Objective:   Today's Vitals   04/22/22 0956  BP: 124/68  Pulse: 73  Temp: (!) 97 F (36.1  C)  TempSrc: Temporal  SpO2: 98%  Weight: 169 lb 3.2 oz (76.7 kg)  Height: '5\' 5"'$  (1.651 m)   Body mass index is 28.16 kg/m.   General: Well developed, well nourished. No acute distress. Lungs: Clear to auscultation bilaterally. No wheezing, rales or rhonchi. CV: RRR without murmurs or rubs. Pulses 2+ bilaterally. Extremities: Trace LE edema. Psych: Alert and oriented. Normal mood and affect.  Health Maintenance Due  Topic Date Due   Zoster Vaccines- Shingrix (1 of 2) Never done     Assessment & Plan:   1. Rectal bleeding I reviewed the hospital discharge summary. No bleeding currently. I will reassess his CBC and renal funciton.  - CBC - Basic metabolic panel  2. Hemorrhoids, unspecified hemorrhoid type Potential source. No current bleeding.  3. HFrEF (heart failure with reduced ejection fraction) ([place]) I  agree that Mr. Ryden's blood pressures may not tolerate him resuming his metoprolol and Entresto. I recommend he continue to hold these pending his follow-up with cardiology. I strongly advised him to do daily weights and to call Dr. Irish Lack if his weight goes up by 3-5 lbs within a week.  - Basic metabolic panel  4. Essential hypertension At goal. Continue to hold meds.  - Basic metabolic panel   Return in about 3 months (around 07/23/2022) for Reassessment.   Haydee Salter, MD

## 2022-04-23 DIAGNOSIS — H26492 Other secondary cataract, left eye: Secondary | ICD-10-CM | POA: Diagnosis not present

## 2022-04-27 ENCOUNTER — Encounter: Payer: Self-pay | Admitting: Physician Assistant

## 2022-04-27 NOTE — Progress Notes (Unsigned)
Cardiology Office Note    Date:  04/28/2022   ID:  David Goodman, DOB 11/12/1941, MRN [id number]  PCP:  David Salter, MD  Cardiologist:  David Grooms, MD  Electrophysiologist:  None   Chief Complaint: f/u recent hospitalization for GIB  History of Present Illness:   David Goodman is a 81 y.o. 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   He established care in 05/2021 for worsening SOB and had cardiac CT performed 06/2021 suggestive of multivessel CAD. Echo showed EF 40-45%. He underwent cardiac cath 07/2021 with findings outlined below - Dr. Irish Goodman discussed with colleagues whether PCI versus CABG would be preferred. He ultimately s/p shockwave lithotripsy and DES to prox-mid LAD, residual disease treated medically. Repeat echo 11/2021 showed EF 30-35% so was switched from losartan to Entresto. He also had a monitor in 11/2021 showing mostly NSR with frequent PVCs 7.1%, brief episodes of NSVT/SVT, prompting increase in BB. CT angio of the aorta was performed 11/2021 to evaluate aortic size showing no evidence of TAA or dissection. This did mention "Improved aeration of the lungs with persistent minimal reticular opacities involving the subpleural aspects of the right middle lobe and lower lobes without associated honeycombing or definitive areas of bronchiectasis. Overall improved appearance of the lungs would argue against previously questioned diagnosis of probable UIP and rather may be seen in the setting of a resolved atypical infection. Clinical correlation is advised." He last saw pulm 11/2020 with recommendation to f/u in 4 weeks.  He was recently admitted several weeks ago with GIB/anemia and hemorrhagic shock requiring pressor support. Patient  underwent colonoscopy 5/15 with internal hemorrhoids and diverticulosis, no clear source of bleeding identified. He was tx with blood transfusions. He was on ASA + Plavix on admission given his h/o PCI. The case was discussed with our team who recommended to stay on at least one antiplatelet agent but could discontinue the second one; GI preferred ASA over Plavix due to bleeding risk. Entresto and metoprolol were held due to low BP. Repeat echo during stay 04/12/22 showed EF 40-45%, global HK, mild asymmetric LVH, g1DD, mild MR.  He is seen for follow-up today overall stable from recent hospitalization. He did see PCP earlier today for some nasal congestion and cough felt to represent viral bronchitis. He has chronic DOE. He has not had any CP, orthopnea, edema, syncope or palpitations. His BP has started to trend upward off his metoprolol and Entresto. Although not presently on his list, he confirms he is on ASA.   Labwork independently reviewed: 03/2022 K 4.2, Cr 0.92, Hgb 9.4, plt 277, albumin 2.7, AST/ALT wnl, Mg 1.6-1.9 11/2021 LDL 41, trig 189 04/2021 TSH wnl  Cardiology Studies:   Studies reviewed are outlined and summarized above. Reports included below if pertinent.   2D echo 04/12/22    1. Left ventricular ejection fraction, by estimation, is 40 to 45%. The  left ventricle has mildly decreased function. The left ventricle  demonstrates global hypokinesis. There is mild asymmetric left ventricular  hypertrophy. Left ventricular diastolic  parameters are consistent with Grade I diastolic dysfunction (impaired  relaxation).   2. Right ventricular systolic function is normal. The right ventricular  size is normal.   3. The mitral valve is normal in structure. Mild mitral valve  regurgitation.  No evidence of mitral stenosis.   4. The aortic valve is tricuspid. Aortic valve regurgitation is not  visualized. Aortic valve sclerosis/calcification is present, without any  evidence of aortic  stenosis.   5. The inferior vena cava is normal in size with greater than 50%  respiratory variability, suggesting right atrial pressure of 3 mmHg.   Comparison(s): The left ventricular function has improved slighlty in  11/2021 was 35-40% now 40-45%.   Monitor 11/2021 Normal sinus rhythm with rare SVT and NSVT. Frequent PVCs with rare PACs noted. No symptoms associated with premature beats. No atrial fibrillation.     Patch Wear Time:  2 days and 23 hours (2023-01-20T10:05:48-0500 to 2023-01-23T09:20:41-0500)   Patient had a min HR of 55 bpm, max HR of 226 bpm, and avg HR of 80 bpm. Predominant underlying rhythm was Sinus Rhythm. 14 Ventricular Tachycardia runs occurred, the run with the fastest interval lasting 4 beats with a max rate of 226 bpm, the longest  lasting 12 beats with an avg rate of 127 bpm. 10 Supraventricular Tachycardia runs occurred, the run with the fastest interval lasting 14 beats with a max rate of 184 bpm, the longest lasting 16 beats with an avg rate of 96 bpm. Isolated SVEs were rare  (<1.0%), SVE Couplets were rare (<1.0%), and SVE Triplets were rare (<1.0%). Isolated VEs were frequent (7.1%, 24152), VE Couplets were rare (<1.0%, 769), and VE Triplets were rare (<1.0%, 51). Ventricular Bigeminy and Trigeminy were present.   PCI 07/2021   Prox LAD to Mid LAD lesion is 75% stenosed.   After shockwave lithotripsy, a drug-eluting stent was successfully placed using a STENT ONYX FRONTIER 3.0X38, postdilated to 4 mm.  The stent was optimized with intravascular ultrasound.   Post intervention, there is a 0% residual stenosis.   Moderate diffuse disease noted in the remainder of the LAD.   LV end diastolic pressure is normal.   There is no aortic valve stenosis.   Continue dual antiplatelet therapy for at least 6 months.  Continue aggressive secondary prevention and risk factor modification.  Plan for same-day PCI.   Left radial was used due to known tortuosity in the  right subclavian.  Cath 06/2021   Mid RCA lesion is 60% stenosed.   1st Mrg lesion is 80% stenosed.   Prox LAD to Mid LAD lesion is 75% stenosed.   Mid LAD lesion is 60% stenosed.   2nd Diag lesion is 80% stenosed.   LV end diastolic pressure is normal.   There is no aortic valve stenosis.   Severe multivessel disease including LAD, diagonal, OM and moderate disease in the mid RCA.  Will discuss with colleagues whether PCI or CABG would be the best treatment. Continue medical therapy for LV dysfunction.   Severe tortuosity in the right subclavian which did not straighten with destination sheath.  Would not use right radial approach in the future.  Cor CT 06/2021 IMPRESSION: 1. Coronary calcium score of 1331. This was 49 percentile for age-, sex, and race-matched controls.   2. Normal coronary origin with right dominance.   3. Severe 3 vessel CAD including severe stenosis in the proximal LAD, ostial OM2 and distal RCA.   4. Aortic atherosclerosis noted.   5. Recommend cardiac catheterization.   RECOMMENDATIONS: CAD-RADS 4: Severe stenosis. (70-99% or > 50% left main). Cardiac catheterization or CT FFR is recommended. Consider symptom-guided anti-ischemic pharmacotherapy as well as risk factor modification per guideline directed care. Invasive coronary angiography recommended with revascularization  per published guideline statements.   Kirk Ruths, MD     Electronically Signed   By: Kirk Ruths M.D.   On: 07/21/2021 14:33  IMPRESSION: 1. 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 Follow-up outpatient referral to pulmonology for further clinical evaluation is recommended. 2.  Aortic Atherosclerosis (ICD10-I70.0).   Electronically Signed: By: Vinnie Langton M.D. On: 07/21/2021 10:13        Past Medical History:   Diagnosis Date   ALLERGIC RHINITIS 07/13/2007   Aorta dilated on Echo; Normal sized Aorta on CT    Chest CT 1/23: No thoracic aortic aneurysm; coronary calcifications; aortic atherosclerosis; improved aeration of lungs with persistent minimal reticular opacities   CAD (coronary artery disease)    GERD (gastroesophageal reflux disease)    GI bleed    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) ([place])    HYPERLIPIDEMIA 07/13/2007   HYPERTENSION 07/13/2007   Impaired glucose tolerance 09/27/2016   Iron deficiency anemia due to chronic blood loss 02/27/2021   NEPHROLITHIASIS, 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   NSVT (nonsustained ventricular tachycardia) ([place])    PEPTIC ULCER DISEASE 07/13/2007   PVC's (premature ventricular contractions)    SVT (supraventricular tachycardia) ([place])    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    Past Surgical History:  Procedure Laterality Date   BROW LIFT Bilateral 11/10/2020   Procedure: BLEPHAROPLASTY;  Surgeon: [name], MD;  Location: [facility];  Service: Plastics;  Laterality: Bilateral;  1 hour   COLONOSCOPY N/A 04/12/2022   Procedure: COLONOSCOPY;  Surgeon: Arta Silence, MD;  Location: [facility];  Service: Gastroenterology;  Laterality: N/A;   COLONOSCOPY WITH PROPOFOL N/A 02/17/2019   Procedure: COLONOSCOPY WITH PROPOFOL;  Surgeon: Ronnette Juniper, MD;  Location: [facility];  Service: Gastroenterology;  Laterality: N/A;   CORONARY STENT INTERVENTION N/A 08/25/2021   Procedure: CORONARY STENT INTERVENTION;  Surgeon: Jettie Booze, MD;  Location: [facility];  Service: Cardiovascular;  Laterality: N/A;   HYDROCELE EXCISION Right 05/15/2003   INTRAVASCULAR LITHOTRIPSY  08/25/2021   Procedure: INTRAVASCULAR LITHOTRIPSY;  Surgeon: Jettie Booze, MD;  Location: [facility];  Service: Cardiovascular;;   INTRAVASCULAR ULTRASOUND/IVUS N/A 08/25/2021   Procedure: Intravascular Ultrasound/IVUS;  Surgeon:  Jettie Booze, MD;  Location: [facility];  Service: Cardiovascular;  Laterality: N/A;   IR ANGIOGRAM VISCERAL SELECTIVE  02/16/2019   IR ANGIOGRAM VISCERAL SELECTIVE  02/16/2019   IR ANGIOGRAM VISCERAL SELECTIVE  02/16/2019   IR US GUIDE VASC ACCESS RIGHT  02/16/2019   LEFT HEART CATH AND CORONARY ANGIOGRAPHY N/A 07/28/2021   Procedure: LEFT HEART CATH AND CORONARY ANGIOGRAPHY;  Surgeon: Jettie Booze, MD;  Location: [facility];  Service: Cardiovascular;  Laterality: N/A;   LEFT HEART CATH AND CORONARY ANGIOGRAPHY N/A 08/25/2021   Procedure: LEFT HEART CATH AND CORONARY ANGIOGRAPHY;  Surgeon: Jettie Booze, MD;  Location: [facility];  Service: Cardiovascular;  Laterality: N/A;   ROTATOR CUFF REPAIR     SPERMATOCELECTOMY Right 05/15/2003    Current Medications: Current Meds  Medication Sig   Ascorbic Acid (VITAMIN C) 1000 MG tablet Take 1,000 mg by mouth 2 (two) times a week.   cetirizine (ZYRTEC) 10 MG tablet Take 1 tablet (10 mg total) by mouth daily. (Patient taking differently: Take 10 mg by mouth daily as needed for allergies.)   fluticasone (  FLONASE) 50 MCG/ACT nasal spray Place 1 spray into both nostrils 2 (two) times daily. (Patient taking differently: Place 2 sprays into both nostrils 2 (two) times daily.)   guaiFENesin-codeine 100-10 MG/5ML syrup Take 5 mLs by mouth 3 (three) times daily as needed for cough.   latanoprost (XALATAN) 0.005 % ophthalmic solution Place 1 drop into both eyes at bedtime.   MAGNESIUM PO Take 400 mg by mouth at bedtime.   metoprolol succinate (TOPROL XL) 25 MG 24 hr tablet Take 1 tablet (25 mg total) by mouth daily.   Multiple Vitamin (MULTIVITAMIN WITH MINERALS) TABS tablet Take 1 tablet by mouth daily.   nitroGLYCERIN (NITROSTAT) 0.4 MG SL tablet Place 1 tablet (0.4 mg total) under the tongue every 5 (five) minutes as needed for chest pain.   pantoprazole (PROTONIX) 40 MG tablet TAKE 1 TABLET BY MOUTH ONCE DAILY  --PATIENT  TO  STOP  OMEPRAZOLE (Patient taking differently: Take 40 mg by mouth daily.)   Polyvinyl Alcohol-Povidone (REFRESH OP) Place 1 drop into the left eye 2 (two) times daily as needed (dryness).   psyllium (METAMUCIL) 58.6 % powder Take 1 packet by mouth daily.   rosuvastatin (CRESTOR) 20 MG tablet Take 1 tablet (20 mg total) by mouth daily.   sacubitril-valsartan (ENTRESTO) 24-26 MG Take 1 tablet by mouth 2 (two) times daily.   tadalafil (CIALIS) 20 MG tablet Take 1 tablet (20 mg total) by mouth daily as needed for erectile dysfunction.   tamsulosin (FLOMAX) 0.4 MG CAPS capsule TAKE 1 CAPSULE BY MOUTH  DAILY (Patient taking differently: Take 0.4 mg by mouth daily.)      Allergies:   Patient has no known allergies.   Social History   Socioeconomic History   Marital status: Married    Spouse name: Not on file   Number of children: Not on file   Years of education: Not on file   Highest education level: Not on file  Occupational History   Not on file  Tobacco Use   Smoking status: Never   Smokeless tobacco: Never  Vaping Use   Vaping Use: Never used  Substance and Sexual Activity   Alcohol use: Yes    Comment: couple times a week - wine   Drug use: No   Sexual activity: Not Currently  Other Topics Concern   Not on file  Social History Narrative   Lives alone   Right handed   Caffeine: 1 cup of coffee a day   Social Determinants of Health   Financial Resource Strain: Low Risk    Difficulty of Paying Living Expenses: Not hard at all  Food Insecurity: Not on file  Transportation Needs: Not on file  Physical Activity: Not on file  Stress: Not on file  Social Connections: Not on file     Family History:  The patient's family history includes Cancer in his brother.  ROS:   Please see the history of present illness.  All other systems are reviewed and otherwise negative.    EKG(s)/Additional Labs   EKG:  EKG is not ordered today but reviewed tracing from  recent admission  Recent Labs: 05/11/2021: TSH 3.93 04/13/2022: Magnesium 1.9 04/16/2022: ALT 13 04/22/2022: BUN 22; Creatinine, Ser 0.92; Hemoglobin 9.4; Platelets 277.0; Potassium 4.3; Sodium 136  Recent Lipid Panel    Component Value Date/Time   CHOL 114 12/09/2021 0838   TRIG 189 (H) 12/09/2021 0838   HDL 42 12/09/2021 0838   CHOLHDL 2.7 12/09/2021 0838   CHOLHDL 4  02/27/2021 0950   VLDL 46.4 (H) 02/27/2021 0950   LDLCALC 41 12/09/2021 0838   LDLDIRECT 129.0 02/27/2021 0950    PHYSICAL EXAM:    VS:  BP (!) 144/82   Pulse 71   Ht '5\' 5"'$  (1.651 m)   Wt 167 lb 12.8 oz (76.1 kg)   SpO2 96%   BMI 27.92 kg/m   BMI: Body mass index is 27.92 kg/m.  GEN: Well nourished, well developed male in no acute distress HEENT: normocephalic, atraumatic Neck: no JVD, carotid bruits, or masses Cardiac: RRR; no murmurs, rubs, or gallops, no edema  Respiratory:  clear to auscultation bilaterally, normal work of breathing GI: soft, nontender, nondistended, + BS MS: no deformity or atrophy Skin: warm and dry, no rash Neuro:  Alert and Oriented x 3, Strength and sensation are intact, follows commands Psych: euthymic mood, full affect  Wt Readings from Last 3 Encounters:  04/28/22 167 lb 12.8 oz (76.1 kg)  04/28/22 167 lb 9.6 oz (76 kg)  04/22/22 169 lb 3.2 oz (76.7 kg)     ASSESSMENT & PLAN:   1. CAD with HLD goal LDL <70 - Plavix stopped during recent admission due to GIB. He confirms he remains on ASA at this time, will add back to list. He has not had any recent angina. We will otherwise restart beta blocker today and continue rosuvastatin. LDL was at goal in January. Also reviewed tadalafil/SL NTG precautions (not to use within 48 hours of each other). He does have chronic dyspnea in constellation of history above and I did encourage him to reach back out to pulmonology to review his plan for if he was to continue followup there (last saw 11/2020).   2. Chronic HFmrEF with essential HTN -  appears euvolemic on exam today. His BP is starting to creep up suggesting degree of recovery from his recent hospitalization. Recheck by me was 160/82. We will restart metoprolol and Entresto today with close f/u in 2-3 weeks with me. Delene Loll is costly for him but he is willing to continue this.   3. Mild mitral regurgitation - by echo 03/2022, no specific intervention needed at this time for this. Guidelines suggest following 3-5 years but will follow clinically for now.  4. Recent GIB/anemia - recheck CBC today with labs. He also has f/u with GI coming up in June.  5. H/o PVCs, SVT, NSVT - rare ectopy heard on exam. Will repeat BMET, Mg, TSH today with labs and resume beta blocker.    Disposition: F/u with me in 2-3 weeks.   Medication Adjustments/Labs and Tests Ordered: Current medicines are reviewed at length with the patient today.  Concerns regarding medicines are outlined above. Medication changes, Labs and Tests ordered today are summarized above and listed in the Patient Instructions accessible in Encounters.   Signed, Charlie Pitter, PA-C  04/28/2022 3:10 PM    [facility] Phone: [phone]; Fax: [phone]

## 2022-04-27 NOTE — Telephone Encounter (Signed)
This encounter was created in error - please disregard.

## 2022-04-28 ENCOUNTER — Encounter: Payer: Self-pay | Admitting: Physician Assistant

## 2022-04-28 ENCOUNTER — Encounter: Payer: Self-pay | Admitting: Family Medicine

## 2022-04-28 ENCOUNTER — Ambulatory Visit: Payer: Medicare Other | Admitting: Physician Assistant

## 2022-04-28 ENCOUNTER — Ambulatory Visit (INDEPENDENT_AMBULATORY_CARE_PROVIDER_SITE_OTHER): Payer: Medicare Other | Admitting: Family Medicine

## 2022-04-28 VITALS — BP 134/76 | HR 74 | Temp 97.0°F | Ht 65.0 in | Wt 167.6 lb

## 2022-04-28 VITALS — BP 160/82 | HR 71 | Ht 65.0 in | Wt 167.8 lb

## 2022-04-28 DIAGNOSIS — I2510 Atherosclerotic heart disease of native coronary artery without angina pectoris: Secondary | ICD-10-CM | POA: Diagnosis not present

## 2022-04-28 DIAGNOSIS — J209 Acute bronchitis, unspecified: Secondary | ICD-10-CM | POA: Diagnosis not present

## 2022-04-28 DIAGNOSIS — I4729 Other ventricular tachycardia: Secondary | ICD-10-CM

## 2022-04-28 DIAGNOSIS — I10 Essential (primary) hypertension: Secondary | ICD-10-CM

## 2022-04-28 DIAGNOSIS — I5020 Unspecified systolic (congestive) heart failure: Secondary | ICD-10-CM | POA: Diagnosis not present

## 2022-04-28 DIAGNOSIS — E785 Hyperlipidemia, unspecified: Secondary | ICD-10-CM

## 2022-04-28 DIAGNOSIS — I5022 Chronic systolic (congestive) heart failure: Secondary | ICD-10-CM

## 2022-04-28 DIAGNOSIS — I471 Supraventricular tachycardia: Secondary | ICD-10-CM

## 2022-04-28 DIAGNOSIS — Z8719 Personal history of other diseases of the digestive system: Secondary | ICD-10-CM | POA: Diagnosis not present

## 2022-04-28 DIAGNOSIS — I340 Nonrheumatic mitral (valve) insufficiency: Secondary | ICD-10-CM | POA: Diagnosis not present

## 2022-04-28 DIAGNOSIS — I493 Ventricular premature depolarization: Secondary | ICD-10-CM

## 2022-04-28 MED ORDER — GUAIFENESIN-CODEINE 100-10 MG/5ML PO SOLN
5.0000 mL | Freq: Three times a day (TID) | ORAL | 0 refills | Status: DC | PRN
Start: 2022-04-28 — End: 2022-05-12

## 2022-04-28 MED ORDER — ASPIRIN 81 MG PO TBEC: 81.0000 mg | DELAYED_RELEASE_TABLET | Freq: Every day | ORAL | 3 refills | Status: DC

## 2022-04-28 MED ORDER — ENTRESTO 24-26 MG PO TABS: 1.0000 | ORAL_TABLET | Freq: Two times a day (BID) | ORAL | 2 refills | Status: DC

## 2022-04-28 NOTE — Patient Instructions (Addendum)
Medication Instructions:  TAKE Aspirin '81mg'$  Take 1 tablet once a dday RESTART Metoprolol once a day RESTART ENSTRESTO Take 1 tablet twice a day  *If you need a refill on your cardiac medications before your next appointment, please call your pharmacy*   Lab Work: TODAY-BMET, MAG, CBC, TSH If you have labs (blood work) drawn today and your tests are completely normal, you will receive your results only by: [place] (if you have MyChart) OR A paper copy in the mail If you have any lab test that is abnormal or we need to change your treatment, we will call you to review the results.   Testing/Procedures: NONE ORDERED   Follow-Up: At [facility], you and your health needs are our priority.  As part of our continuing mission to provide you with exceptional heart care, we have created designated Provider Care Teams.  These Care Teams include your primary Cardiologist (physician) and Advanced Practice Providers (APPs -  Physician Assistants and Nurse Practitioners) who all work together to provide you with the care you need, when you need it.  We recommend signing up for the patient portal called "MyChart".  Sign up information is provided on this After Visit Summary.  MyChart is used to connect with patients for Virtual Visits (Telemedicine).  Patients are able to view lab/test results, encounter notes, upcoming appointments, etc.  Non-urgent messages can be sent to your provider as well.   To learn more about what you can do with MyChart, go to [email or web address].    Your next appointment:   1 month(s)  The format for your next appointment:   In Person  Provider:   Melina Copa, PA   Other Instructions Do not take nitroglycerin if you have taken tadalafil in the last 48 hours. The opposite is true as well. Do not take tadalafil if you have taken nitroglycerin in the last 48 hours. If you take these two medicines, they can cause low blood pressure if taken close together.    Important Information About Sugar

## 2022-04-28 NOTE — Progress Notes (Signed)
David Goodman [address] [place] [address] Dept: [phone] Dept Fax: [phone]  Office Visit  Subjective:    [name], male    DOB: [birth year], 81 y.o..   MRN: [id number]  Chief Complaint  Patient presents with   Acute Visit    C/o having HA's, persistent cough, chest congestion, no energy x 1 week.  Has taken Tylenol Sinus with little relief.    History of Present Illness:  Patient is in today complaining of a 1-week history of headache, cough, chest congestion, and low energy. He notes the cough is especially bad at night when he lies down in bed. He has not noted any swelling of his legs. He denies fever, nasal congestion, or rhinorrhea.  David Goodman has a history of HFrEF, hypertension, and recent  anemia due to GI blood loss. He is still holding his Entresto and metoprolol. He has an appointment this afternoon with Dr. Irish Lack.  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   Vivid dream 09/21/2021   Excessive daytime sleepiness 09/21/2021   Snoring 09/21/2021   Coronary artery disease involving native coronary artery of native heart 09/21/2021   SOBOE (shortness of breath on exertion) 09/21/2021   Coronary artery disease    HFrEF (heart failure with reduced ejection fraction) ([facility])    Bell's palsy, left 06/05/2021   Cerebrovascular disease 06/05/2021   Hiatal hernia 05/29/2021   Depression, major, single episode, mild ([facility]) 05/29/2021   Iron deficiency anemia due to chronic blood loss 02/27/2021   Glaucoma 02/27/2021   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   Erectile dysfunction due to arterial insufficiency 02/27/2021   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 12/29/2020   Hemorrhoids 07/17/2020   RLS (restless legs syndrome) 10/17/2019    Syncope 11/15/2018   Diverticulitis 05/25/2018   Osteoarthritis of right knee 12/13/2017   Osteoarthritis of left knee 12/13/2017   Testosterone deficiency 03/22/2017   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/27/2016   GERD (gastroesophageal reflux disease) 09/27/2016   BPH (benign prostatic hyperplasia) 06/18/2013   HLD (hyperlipidemia) 07/13/2007   Essential hypertension 07/13/2007   Allergic rhinitis 07/13/2007   Peptic ulcer disease 07/13/2007   History of nephrolithiasis 07/13/2007   Past Surgical History:  Procedure Laterality Date   BROW LIFT Bilateral 11/10/2020   Procedure: BLEPHAROPLASTY;  Surgeon: [name], MD;  Location: [facility];  Service: Plastics;  Laterality: Bilateral;  1 hour   COLONOSCOPY N/A 04/12/2022   Procedure: COLONOSCOPY;  Surgeon: Arta Silence, MD;  [facility];  Service: Gastroenterology;  Laterality: N/A;   COLONOSCOPY WITH PROPOFOL N/A 02/17/2019   Procedure: COLONOSCOPY WITH PROPOFOL;  Surgeon: Ronnette Juniper, MD;  [facility];  Service: Gastroenterology;  Laterality: N/A;   CORONARY STENT INTERVENTION N/A 08/25/2021   Procedure: CORONARY STENT INTERVENTION;  Surgeon: Jettie Booze, MD;  Location: [facility];  Service: Cardiovascular;  Laterality: N/A;   HYDROCELE EXCISION Right 05/15/2003   INTRAVASCULAR LITHOTRIPSY  08/25/2021   Procedure: INTRAVASCULAR LITHOTRIPSY;  Surgeon: Jettie Booze, MD;  Location: [facility];  Service: Cardiovascular;;   INTRAVASCULAR ULTRASOUND/IVUS N/A 08/25/2021   Procedure: Intravascular Ultrasound/IVUS;  Surgeon: Jettie Booze, MD;  Location: [facility];  Service: Cardiovascular;  Laterality: N/A;   IR ANGIOGRAM VISCERAL SELECTIVE  02/16/2019   IR ANGIOGRAM VISCERAL SELECTIVE  02/16/2019   IR ANGIOGRAM VISCERAL SELECTIVE  02/16/2019   IR US GUIDE VASC ACCESS RIGHT  02/16/2019   LEFT HEART CATH AND CORONARY ANGIOGRAPHY N/A 07/28/2021   Procedure:  LEFT HEART CATH AND CORONARY ANGIOGRAPHY;  Surgeon: Jettie Booze, MD;  Location: [facility];  Service: Cardiovascular;  Laterality: N/A;   LEFT HEART CATH AND CORONARY ANGIOGRAPHY N/A 08/25/2021   Procedure: LEFT HEART CATH AND CORONARY ANGIOGRAPHY;  Surgeon: Jettie Booze, MD;  Location: [facility];  Service: Cardiovascular;  Laterality: N/A;   ROTATOR CUFF REPAIR     SPERMATOCELECTOMY Right 05/15/2003   Family History  Problem Relation Age of Onset   Cancer Brother    Outpatient Medications Prior to Visit  Medication Sig Dispense Refill   Ascorbic Acid (VITAMIN C) 1000 MG tablet Take 1,000 mg by mouth 2 (two) times a week.     cetirizine (ZYRTEC) 10 MG tablet Take 1 tablet (10 mg total) by mouth daily. (Patient taking differently: Take 10 mg by mouth daily as needed for allergies.) 30 tablet 11   fluticasone (FLONASE) 50 MCG/ACT nasal spray Place 1 spray into both nostrils 2 (two) times daily. (Patient taking differently: Place 2 sprays into both nostrils 2 (two) times daily.) 15.8 mL 6   latanoprost (XALATAN) 0.005 % ophthalmic solution Place 1 drop into both eyes at bedtime. 7.5 mL 1   MAGNESIUM PO Take 400 mg by mouth at bedtime.     Multiple Vitamin (MULTIVITAMIN WITH MINERALS) TABS tablet Take 1 tablet by mouth daily.     nitroGLYCERIN (NITROSTAT) 0.4 MG SL tablet Place 1 tablet (0.4 mg total) under the tongue every 5 (five) minutes as needed for chest pain. 25 tablet 3   pantoprazole (PROTONIX) 40 MG tablet TAKE 1 TABLET BY MOUTH ONCE DAILY --PATIENT  TO  STOP  OMEPRAZOLE (Patient taking differently: Take 40 mg by mouth daily.) 30 tablet 0   Polyvinyl Alcohol-Povidone (REFRESH OP) Place 1 drop into the left eye 2 (two) times daily as needed (dryness).     psyllium (METAMUCIL) 58.6 % powder Take 1 packet by mouth daily.     rosuvastatin (CRESTOR) 20 MG tablet Take 1 tablet (20 mg total) by mouth daily. 30 tablet 11   tadalafil (CIALIS) 20 MG tablet Take 1  tablet (20 mg total) by mouth daily as needed for erectile dysfunction. 30 tablet 2   tamsulosin (FLOMAX) 0.4 MG CAPS capsule TAKE 1 CAPSULE BY MOUTH  DAILY (Patient taking differently: Take 0.4 mg by mouth daily.) 90 capsule 3   metoprolol succinate (TOPROL XL) 25 MG 24 hr tablet Take 1 tablet (25 mg total) by mouth daily. (Patient not taking: Reported on 04/28/2022) 90 tablet 3   sacubitril-valsartan (ENTRESTO) 24-26 MG Take 1 tablet by mouth 2 (two) times daily. (Patient not taking: Reported on 04/28/2022) 180 tablet 2   No facility-administered medications prior to visit.   No Known Allergies    Objective:   Today's Vitals   04/28/22 1047  BP: 134/76  Pulse: 74  Temp: (!) 97 F (36.1 C)  TempSrc: Temporal  SpO2: 98%  Weight: 167 lb 9.6 oz (76 kg)  Height: '5\' 5"'$  (1.651 m)   Body mass index is 27.89 kg/m.   General: Well developed, well nourished. No acute distress. HEENT: Normocephalic, non-traumatic. Conjunctiva clear. External ears normal. EAC and TMs normal   bilaterally. Nose clear without congestion or rhinorrhea. Mucous membranes moist. Oropharynx clear. Good   dentition. Neck: Supple.  No lymphadenopathy. No thyromegaly. Lungs: Clear to auscultation bilaterally. No wheezing, rales or rhonchi. CV: RRR without murmurs or rubs. Pulses 2+ bilaterally. Extremities: No edema noted. Psych: Alert and oriented. Normal mood and affect.  Health Maintenance Due  Topic Date Due   Zoster Vaccines- Shingrix (1 of 2) Never done   Lab Results:      Latest Ref Rng & Units 04/22/2022   10:30 AM 04/16/2022    2:55 AM 04/15/2022   10:37 AM  CBC  WBC 4.0 - 10.5 K/uL 6.9   7.5     Hemoglobin 13.0 - 17.0 g/dL 9.4   8.3   8.6    Hematocrit 39.0 - 52.0 % 27.8   24.3   24.4    Platelets 150.0 - 400.0 K/uL 277.0  C 144       C Corrected result      Latest Ref Rng & Units 04/22/2022   10:30 AM 04/16/2022    2:55 AM 04/14/2022    6:20 AM  BMP  Glucose 70 - 99 mg/dL 98   99   115     BUN 6 - 23 mg/dL '22   6   10    '$ Creatinine 0.40 - 1.50 mg/dL 0.92   1.10   0.92    Sodium 135 - 145 mEq/L 136   138   137    Potassium 3.5 - 5.1 mEq/L 4.3   3.7   3.9    Chloride 96 - 112 mEq/L 104   111   113    CO2 19 - 32 mEq/L '23   22   20    '$ Calcium 8.4 - 10.5 mg/dL 8.7   7.9   7.0     Assessment & Plan:   1. Acute bronchitis, unspecified organism Mr. Botelho's vitals are normal. His lung exam is clear. I suspect he has a viral bronchitis. We will focus on cough management. He asks about antibiotics, but they would not be indicated at this point.   - guaiFENesin-codeine 100-10 MG/5ML syrup; Take 5 mLs by mouth 3 (three) times daily as needed for cough.  Dispense: 120 mL; Refill: 0  2. HFrEF (heart failure with reduced ejection fraction) (H[facility]) Compensated. Mr. Parkison's blood pressure had been low since his hospitalization, so he has been holding his Entresto and metoprolol. Now that it is coming back up, he may be able to tolerate being back on these medications. As he is seeing Dr. Irish Lack this afternoon, I will defer to cardiology abotu restarting these medications.  3. Essential hypertension Blood pressure sis mildly high. Defer to cardiology.  4. Anemia due to chronic blood loss Improving but remains low. I suspect this contributes to Mr. Gettinger's fatigue. We will reassess this in 1-2 months.  Return if symptoms worsen or fail to improve.   Haydee Salter, MD

## 2022-04-29 LAB — BASIC METABOLIC PANEL
BUN/Creatinine Ratio: 21 (ref 10–24)
BUN: 19 mg/dL (ref 8–27)
CO2: 21 mmol/L (ref 20–29)
Calcium: 9.3 mg/dL (ref 8.6–10.2)
Chloride: 101 mmol/L (ref 96–106)
Creatinine, Ser: 0.91 mg/dL (ref 0.76–1.27)
Glucose: 91 mg/dL (ref 70–99)
Potassium: 4.3 mmol/L (ref 3.5–5.2)
Sodium: 137 mmol/L (ref 134–144)
eGFR: 85 mL/min/{1.73_m2} (ref >59)

## 2022-04-29 LAB — CBC
Hematocrit: 29.6 % — ABNORMAL LOW (ref 37.5–51.0)
Hemoglobin: 9.6 g/dL — ABNORMAL LOW (ref 13.0–17.7)
MCH: 28.3 pg (ref 26.6–33.0)
MCHC: 32.4 g/dL (ref 31.5–35.7)
MCV: 87 fL (ref 79–97)
Platelets: 371 10*3/uL (ref 150–450)
RBC: 3.39 x10E6/uL — ABNORMAL LOW (ref 4.14–5.80)
RDW: 14.1 % (ref 11.6–15.4)
WBC: 8.1 10*3/uL (ref 3.4–10.8)

## 2022-04-29 LAB — MAGNESIUM: Magnesium: 2.3 mg/dL (ref 1.6–2.3)

## 2022-04-29 LAB — TSH: TSH: 6.2 u[IU]/mL — ABNORMAL HIGH (ref 0.450–4.500)

## 2022-05-03 ENCOUNTER — Other Ambulatory Visit: Payer: Self-pay | Admitting: Interventional Cardiology

## 2022-05-04 ENCOUNTER — Telehealth: Payer: Self-pay | Admitting: Family Medicine

## 2022-05-04 NOTE — Telephone Encounter (Signed)
Pt stopped by clinic and is wanting Dr. Gena Fray to know he went to his cardiologist and was told his thyroid is off. This is why he is feeling so tired. I offered any appointment. He declined. He is wanting a prescription for this sent over to his pharmacy.  [facility] [address] [place], [place] - [address]  [address], [place] [address]  Phone:  [phone]  Fax:  [phone]

## 2022-05-04 NOTE — Telephone Encounter (Signed)
Spoke to patient and advised that an appointment would be needed for provider to evaluate the labs done by cardiology.  Scheduled him for an appointment 05/12/22 @ 9:00 am. Dm/cma

## 2022-05-11 DIAGNOSIS — K5731 Diverticulosis of large intestine without perforation or abscess with bleeding: Secondary | ICD-10-CM | POA: Diagnosis not present

## 2022-05-12 ENCOUNTER — Encounter: Payer: Self-pay | Admitting: Family Medicine

## 2022-05-12 ENCOUNTER — Ambulatory Visit (INDEPENDENT_AMBULATORY_CARE_PROVIDER_SITE_OTHER): Payer: Medicare Other | Admitting: Family Medicine

## 2022-05-12 ENCOUNTER — Ambulatory Visit (INDEPENDENT_AMBULATORY_CARE_PROVIDER_SITE_OTHER): Payer: Medicare Other

## 2022-05-12 VITALS — BP 138/78 | HR 75 | Temp 97.6°F | Ht 65.0 in | Wt 168.4 lb

## 2022-05-12 DIAGNOSIS — J209 Acute bronchitis, unspecified: Secondary | ICD-10-CM | POA: Diagnosis not present

## 2022-05-12 DIAGNOSIS — R7989 Other specified abnormal findings of blood chemistry: Secondary | ICD-10-CM

## 2022-05-12 DIAGNOSIS — I10 Essential (primary) hypertension: Secondary | ICD-10-CM | POA: Diagnosis not present

## 2022-05-12 DIAGNOSIS — R059 Cough, unspecified: Secondary | ICD-10-CM | POA: Diagnosis not present

## 2022-05-12 DIAGNOSIS — E785 Hyperlipidemia, unspecified: Secondary | ICD-10-CM | POA: Diagnosis not present

## 2022-05-12 LAB — TSH: TSH: 4.93 u[IU]/mL (ref 0.35–5.50)

## 2022-05-12 LAB — T4, FREE: Free T4: 1.04 ng/dL (ref 0.60–1.60)

## 2022-05-12 MED ORDER — GUAIFENESIN-CODEINE 100-10 MG/5ML PO SOLN: 5.0000 mL | Freq: Three times a day (TID) | ORAL | 0 refills | Status: DC | PRN

## 2022-05-12 NOTE — Progress Notes (Signed)
[facility] [address] [place] [address] Dept: [phone] Dept Fax: [phone]  Office Visit  Subjective:    [name], male    DOB: July 30, 1941, 81 y.o..   MRN: [id number]  Chief Complaint  Patient presents with   Follow-up    F/u to discuss thyroid labs. C/o still having chest congestion and persistent cough.     History of Present Illness:  Patient is in today for evaluation related to a recent abnormal TSH. He was recently seen by Ms. Dunn (PA- cardiology) for follow-up of his CAD and HFmrEF. Among other tests, she had checked a TSH which returned elevated. David Goodman has complained of fatigue over the past several months. He has longer standing issues with constipation. He has noted feeling cold more int he last few months, whereas previously he noted he often felt hot. He denies any skin or nail changes.  I had seen David Goodman on 5/31 with a 1-week history of headache, cough, chest congestion, and low energy. He noted the cough was especially bad at night when he lies down in bed. He has not noted any swelling of his legs. He denies fever, nasal congestion, or rhinorrhea. He notes at this point,e is continuing to have cough and mild dyspnea.  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   Vivid dream 09/21/2021   Excessive daytime sleepiness 09/21/2021   Snoring 09/21/2021   Coronary artery disease involving native coronary artery of native heart 09/21/2021   SOBOE (shortness of breath on exertion) 09/21/2021   Coronary artery disease    HFrEF (heart failure with reduced ejection fraction) ([facility])    Bell's palsy, left 06/05/2021   Cerebrovascular disease 06/05/2021   Hiatal hernia 05/29/2021   Anemia due to blood loss 02/27/2021   Glaucoma 02/27/2021   Sensorineural hearing loss  (SNHL) of both ears 02/27/2021   Erectile dysfunction due to arterial insufficiency 02/27/2021   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 12/29/2020   Hemorrhoids 07/17/2020   RLS (restless legs syndrome) 10/17/2019   Syncope 11/15/2018   Diverticulitis 05/25/2018   Osteoarthritis of right knee 12/13/2017   Osteoarthritis of left knee 12/13/2017   Testosterone deficiency 03/22/2017   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/27/2016   GERD (gastroesophageal reflux disease) 09/27/2016   BPH (benign prostatic hyperplasia) 06/18/2013   HLD (hyperlipidemia) 07/13/2007   Essential hypertension 07/13/2007   Allergic rhinitis 07/13/2007   Peptic ulcer disease 07/13/2007   History of nephrolithiasis 07/13/2007   Past Surgical History:  Procedure Laterality Date   BROW LIFT Bilateral 11/10/2020   Procedure: BLEPHAROPLASTY;  Surgeon: [name], MD;  Location: [facility];  Service: Plastics;  Laterality: Bilateral;  1 hour   COLONOSCOPY N/A 04/12/2022   Procedure: COLONOSCOPY;  Surgeon: Arta Silence, MD;  [facility];  Service: Gastroenterology;  Laterality: N/A;   COLONOSCOPY WITH PROPOFOL N/A 02/17/2019   Procedure: COLONOSCOPY WITH PROPOFOL;  Surgeon: Ronnette Juniper, MD;  [facility];  Service: Gastroenterology;  Laterality: N/A;   CORONARY STENT INTERVENTION N/A 08/25/2021   Procedure: CORONARY STENT INTERVENTION;  Surgeon: Jettie Booze, MD;  Location: [facility];  Service: Cardiovascular;  Laterality: N/A;   HYDROCELE EXCISION Right 05/15/2003   INTRAVASCULAR LITHOTRIPSY  08/25/2021   Procedure: INTRAVASCULAR LITHOTRIPSY;  Surgeon: Jettie Booze, MD;  Location: [facility];  Service: Cardiovascular;;   INTRAVASCULAR ULTRASOUND/IVUS N/A 08/25/2021   Procedure: Intravascular Ultrasound/IVUS;  Surgeon: Jettie Booze, MD;  Location: [facility];  Service: Cardiovascular;  Laterality: N/A;   IR ANGIOGRAM  VISCERAL SELECTIVE  02/16/2019   IR ANGIOGRAM VISCERAL SELECTIVE  02/16/2019   IR ANGIOGRAM VISCERAL SELECTIVE  02/16/2019   IR US GUIDE VASC ACCESS RIGHT  02/16/2019   LEFT HEART CATH AND CORONARY ANGIOGRAPHY N/A 07/28/2021   Procedure: LEFT HEART CATH AND CORONARY ANGIOGRAPHY;  Surgeon: Jettie Booze, MD;  Location: [facility];  Service: Cardiovascular;  Laterality: N/A;   LEFT HEART CATH AND CORONARY ANGIOGRAPHY N/A 08/25/2021   Procedure: LEFT HEART CATH AND CORONARY ANGIOGRAPHY;  Surgeon: Jettie Booze, MD;  Location: [facility];  Service: Cardiovascular;  Laterality: N/A;   ROTATOR CUFF REPAIR     SPERMATOCELECTOMY Right 05/15/2003   Family History  Problem Relation Age of Onset   Cancer Brother    Outpatient Medications Prior to Visit  Medication Sig Dispense Refill   Ascorbic Acid (VITAMIN C) 1000 MG tablet Take 1,000 mg by mouth 2 (two) times a week.     aspirin EC 81 MG tablet Take 1 tablet (81 mg total) by mouth daily. Swallow whole. 90 tablet 3   cetirizine (ZYRTEC) 10 MG tablet Take 1 tablet (10 mg total) by mouth daily. (Patient taking differently: Take 10 mg by mouth daily as needed for allergies.) 30 tablet 11   fluticasone (FLONASE) 50 MCG/ACT nasal spray Place 1 spray into both nostrils 2 (two) times daily. (Patient taking differently: Place 2 sprays into both nostrils 2 (two) times daily.) 15.8 mL 6   guaiFENesin-codeine 100-10 MG/5ML syrup Take 5 mLs by mouth 3 (three) times daily as needed for cough. 120 mL 0   latanoprost (XALATAN) 0.005 % ophthalmic solution Place 1 drop into both eyes at bedtime. 7.5 mL 1   MAGNESIUM PO Take 400 mg by mouth at bedtime.     metoprolol succinate (TOPROL XL) 25 MG 24 hr tablet Take 1 tablet (25 mg total) by mouth daily. 90 tablet 3   Multiple Vitamin (MULTIVITAMIN WITH MINERALS) TABS tablet Take 1 tablet by mouth daily.     nitroGLYCERIN (NITROSTAT) 0.4 MG SL tablet Place 1 tablet (0.4 mg total) under the tongue  every 5 (five) minutes as needed for chest pain. 25 tablet 3   pantoprazole (PROTONIX) 40 MG tablet Take 1 tablet (40 mg total) by mouth daily. 30 tablet 11   Polyvinyl Alcohol-Povidone (REFRESH OP) Place 1 drop into the left eye 2 (two) times daily as needed (dryness).     psyllium (METAMUCIL) 58.6 % powder Take 1 packet by mouth daily.     rosuvastatin (CRESTOR) 20 MG tablet Take 1 tablet (20 mg total) by mouth daily. 30 tablet 11   sacubitril-valsartan (ENTRESTO) 24-26 MG Take 1 tablet by mouth 2 (two) times daily. 60 tablet 2   tadalafil (CIALIS) 20 MG tablet Take 1 tablet (20 mg total) by mouth daily as needed for erectile dysfunction. 30 tablet 2   tamsulosin (FLOMAX) 0.4 MG CAPS capsule TAKE 1 CAPSULE BY MOUTH  DAILY (Patient taking differently: Take 0.4 mg by mouth daily.) 90 capsule 3   No facility-administered medications prior to visit.   No Known Allergies    Objective:   Today's Vitals   05/12/22 0913  BP: 138/78  Pulse: 75  Temp: 97.6 F (36.4 C)  TempSrc: Temporal  SpO2: 96%  Weight:  168 lb 6.4 oz (76.4 kg)  Height: '5\' 5"'$  (1.651 m)   Body mass index is 28.02 kg/m.   General: Well developed, well nourished. No acute distress. Lungs: Clear to auscultation bilaterally. No wheezing, rales or rhonchi. Extremities: No edema noted. Psych: Alert and oriented. Normal mood and affect.  Health Maintenance Due  Topic Date Due   Zoster Vaccines- Shingrix (1 of 2) Never done   COVID-19 Vaccine (4 - Pfizer series) 12/23/2020   Lab Results: Lab Results  Component Value Date   TSH 6.200 (H) 04/28/2022   Imaging: Chest x-ray- Normal. No acute infiltrates.  Assessment & Plan:   1. Acute bronchitis, unspecified organism Chest x-ray normal. Vitals remain stable and no sign of weight gain or edema. The cough continues to likley represent acute bronchitis. I will renew his cough medicine and give him more time for this to resolve.  - DG Chest 2 View - guaiFENesin-codeine  100-10 MG/5ML syrup; Take 5 mLs by mouth 3 (three) times daily as needed for cough.  Dispense: 120 mL; Refill: 0  2. Abnormal TSH David Goodman has some symptoms that may represent hypothyroidism. I will repeat his TSH and check a free T4 to confirm his diagnosis. If his TSH remains high, will consider starting levothyroxine.  - T4, free - TSH   Return for As scheduled.   Haydee Salter, MD

## 2022-05-12 NOTE — Progress Notes (Unsigned)
Initial visit for persistent cough x 3-4 weeks Never a smoker Hx of [place]

## 2022-05-19 DIAGNOSIS — M170 Bilateral primary osteoarthritis of knee: Secondary | ICD-10-CM | POA: Diagnosis not present

## 2022-05-21 ENCOUNTER — Ambulatory Visit: Payer: Medicare Other | Admitting: Physician Assistant

## 2022-05-21 ENCOUNTER — Encounter: Payer: Self-pay | Admitting: Physician Assistant

## 2022-05-21 VITALS — BP 128/60 | HR 84 | Ht 65.0 in | Wt 165.8 lb

## 2022-05-21 DIAGNOSIS — I2510 Atherosclerotic heart disease of native coronary artery without angina pectoris: Secondary | ICD-10-CM

## 2022-05-21 DIAGNOSIS — D649 Anemia, unspecified: Secondary | ICD-10-CM

## 2022-05-21 DIAGNOSIS — I5020 Unspecified systolic (congestive) heart failure: Secondary | ICD-10-CM | POA: Diagnosis not present

## 2022-05-21 DIAGNOSIS — I471 Supraventricular tachycardia, unspecified: Secondary | ICD-10-CM

## 2022-05-21 DIAGNOSIS — I10 Essential (primary) hypertension: Secondary | ICD-10-CM | POA: Diagnosis not present

## 2022-05-21 DIAGNOSIS — E785 Hyperlipidemia, unspecified: Secondary | ICD-10-CM | POA: Diagnosis not present

## 2022-05-21 DIAGNOSIS — I4729 Other ventricular tachycardia: Secondary | ICD-10-CM | POA: Diagnosis not present

## 2022-05-21 DIAGNOSIS — I493 Ventricular premature depolarization: Secondary | ICD-10-CM

## 2022-05-22 LAB — CBC
Hematocrit: 36.3 % — ABNORMAL LOW (ref 37.5–51.0)
Hemoglobin: 12 g/dL — ABNORMAL LOW (ref 13.0–17.7)
MCH: 29 pg (ref 26.6–33.0)
MCHC: 33.1 g/dL (ref 31.5–35.7)
MCV: 88 fL (ref 79–97)
Platelets: 289 10*3/uL (ref 150–450)
RBC: 4.14 x10E6/uL (ref 4.14–5.80)
RDW: 14.7 % (ref 11.6–15.4)
WBC: 9.7 10*3/uL (ref 3.4–10.8)

## 2022-05-22 LAB — BASIC METABOLIC PANEL
BUN/Creatinine Ratio: 28 — ABNORMAL HIGH (ref 10–24)
BUN: 25 mg/dL (ref 8–27)
CO2: 20 mmol/L (ref 20–29)
Calcium: 9.6 mg/dL (ref 8.6–10.2)
Chloride: 103 mmol/L (ref 96–106)
Creatinine, Ser: 0.89 mg/dL (ref 0.76–1.27)
Glucose: 123 mg/dL — ABNORMAL HIGH (ref 70–99)
Potassium: 4 mmol/L (ref 3.5–5.2)
Sodium: 138 mmol/L (ref 134–144)
eGFR: 87 mL/min/{1.73_m2} (ref >59)

## 2022-05-24 ENCOUNTER — Telehealth: Payer: Self-pay

## 2022-05-24 ENCOUNTER — Ambulatory Visit: Payer: Medicare Other | Admitting: Nurse Practitioner

## 2022-05-26 DIAGNOSIS — K5900 Constipation, unspecified: Secondary | ICD-10-CM | POA: Diagnosis not present

## 2022-05-28 ENCOUNTER — Telehealth: Payer: Self-pay | Admitting: Neurology

## 2022-05-28 NOTE — Telephone Encounter (Signed)
Pt would like a call from the nurse to discuss if still get a CPAP machine

## 2022-05-31 ENCOUNTER — Encounter: Payer: Self-pay | Admitting: *Deleted

## 2022-05-31 NOTE — Telephone Encounter (Signed)
Called and spoke w/ pt. Aware we will send order to [employer]. Sent letter. Scheduled initial cpap f/u for 08/26/22 at 1030am with Dr. Brett Fairy.

## 2022-05-31 NOTE — Telephone Encounter (Signed)
Per previous phone note from Fayrene Fearing 01/28/22: "I called the pt and we discussed results of sleep study and recommendation from Dr. Brett Fairy. Pt reports he is leaving to go out of town to [place] for 3 weeks and would like to wait until he returns to being the process of starting his new machine. Pt reports he will call us back around 3/25-3/26 to f/u and being the process of sending orders for new autopap machine. Pt advised we would wait to hear back from him before proceeding. "

## 2022-06-14 DIAGNOSIS — G4733 Obstructive sleep apnea (adult) (pediatric): Secondary | ICD-10-CM | POA: Diagnosis not present

## 2022-06-17 ENCOUNTER — Ambulatory Visit (INDEPENDENT_AMBULATORY_CARE_PROVIDER_SITE_OTHER): Payer: Medicare Other

## 2022-06-17 DIAGNOSIS — E785 Hyperlipidemia, unspecified: Secondary | ICD-10-CM

## 2022-06-17 DIAGNOSIS — I5020 Unspecified systolic (congestive) heart failure: Secondary | ICD-10-CM

## 2022-06-17 IMAGING — CT CT CHEST HIGH RESOLUTION W/O CM
2 of 7 series · 13 of 36 positions shown, 16 images · non-contrast
Comparison: 01/18/2020 chest CT.

CLINICAL DATA: Follow-up interstitial lung disease. History of
VVHR3-BY infection in October 2019. Nonsmoker.

EXAM:
CT CHEST WITHOUT CONTRAST
TECHNIQUE: Multidetector CT imaging of the chest was performed following the
standard protocol without intravenous contrast. High resolution
imaging of the lungs, as well as inspiratory and expiratory imaging,
was performed.

[Series 4: chest 2.00 br36 s3 cor soft · coronal · 0.63mm/px · 3 of 187 slices shown]
[im 38/187  lung]
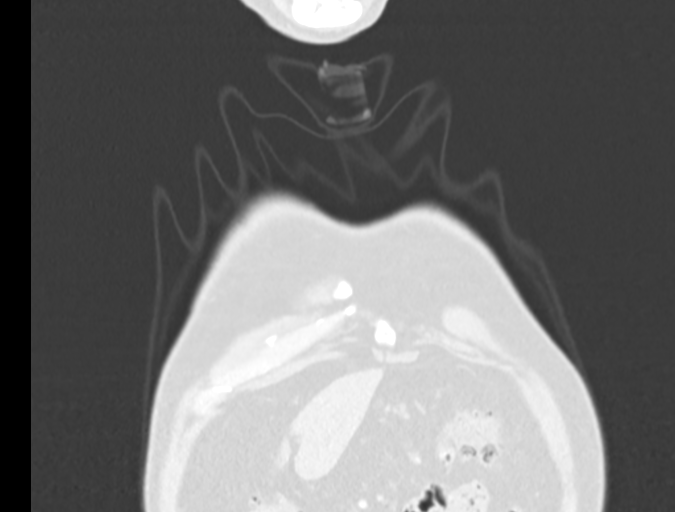
[im 75/187  lung]
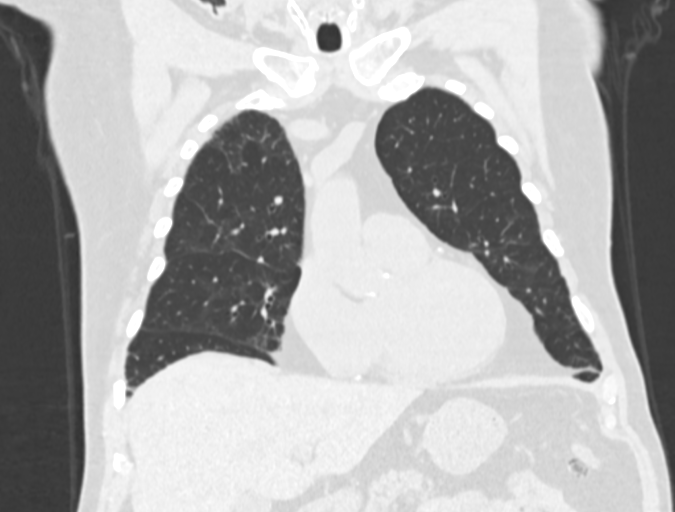
[im 112/187  lung]
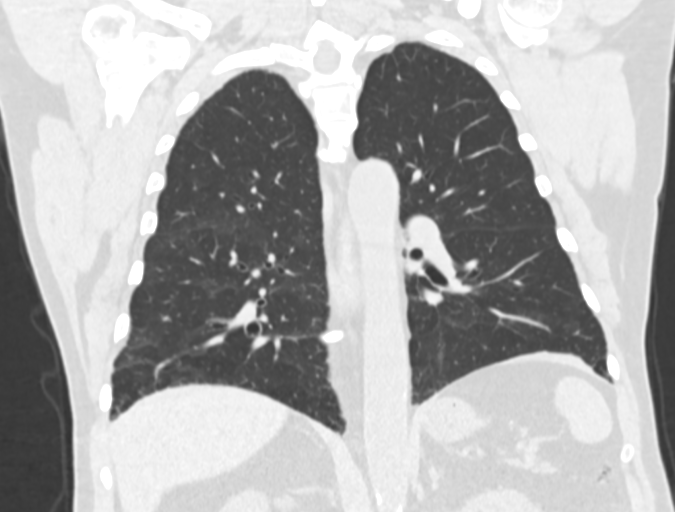

[Series 11: chest 1.00 br60 s3 high res thins 1x1 mm · axial · 0.73mm/px · z∈[+1585,+1855]mm · 10 of 320 slices shown, 13 images]
[im 25/320  mediastinal]
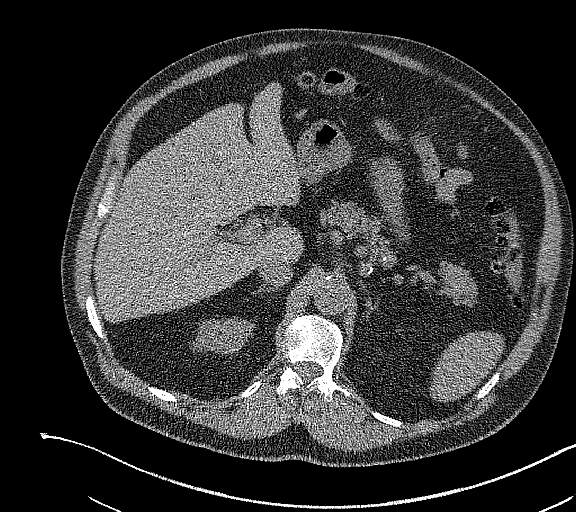
[im 25/320  lung]
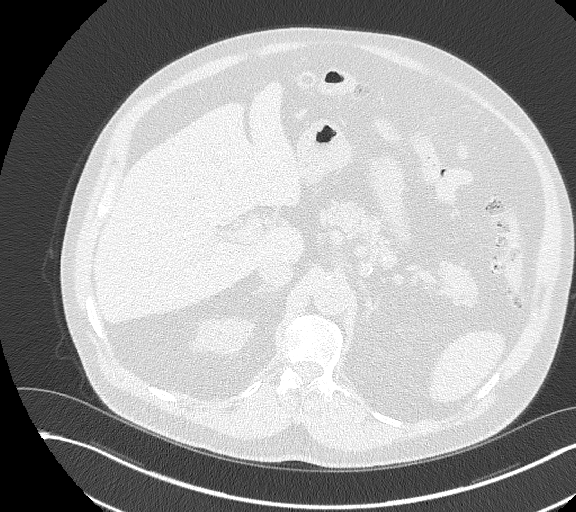
[im 50/320  lung]
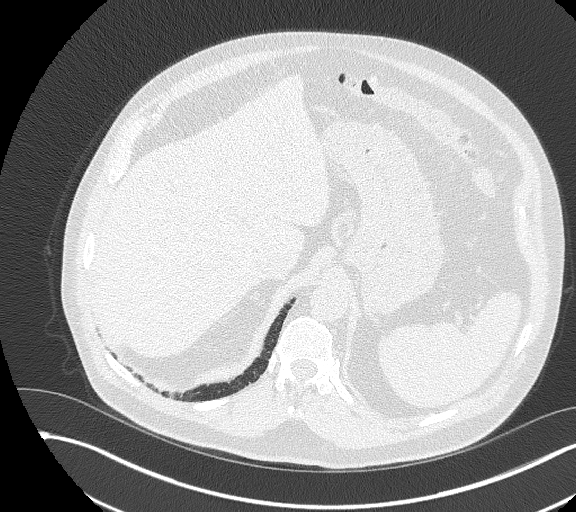
[im 99/320  lung]
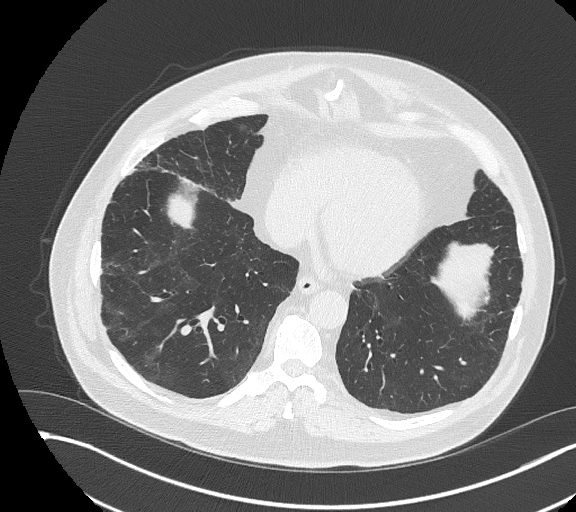
[im 123/320  lung]
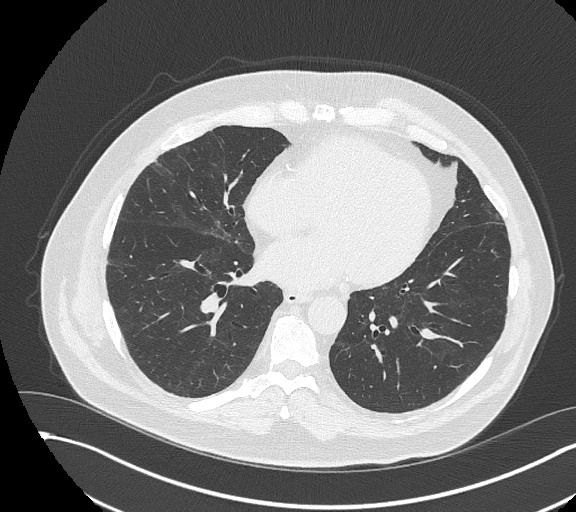
[im 148/320  mediastinal]
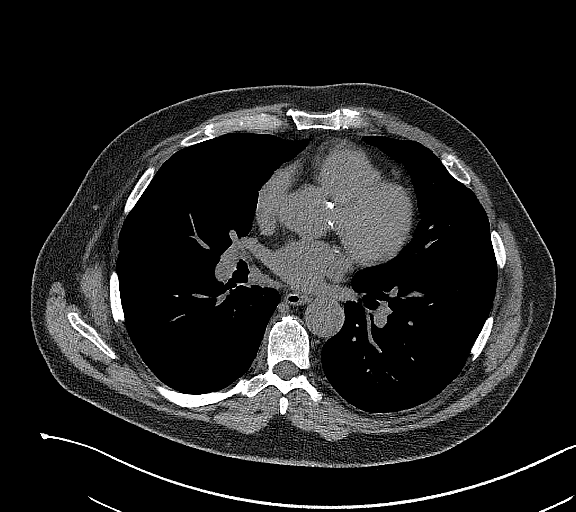
[im 148/320  lung]
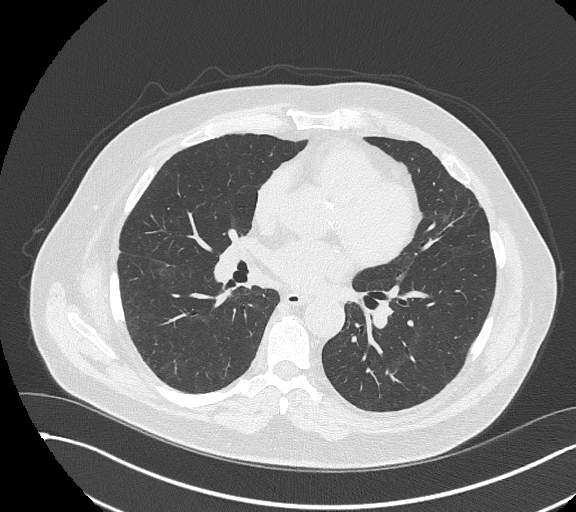
[im 172/320  lung]
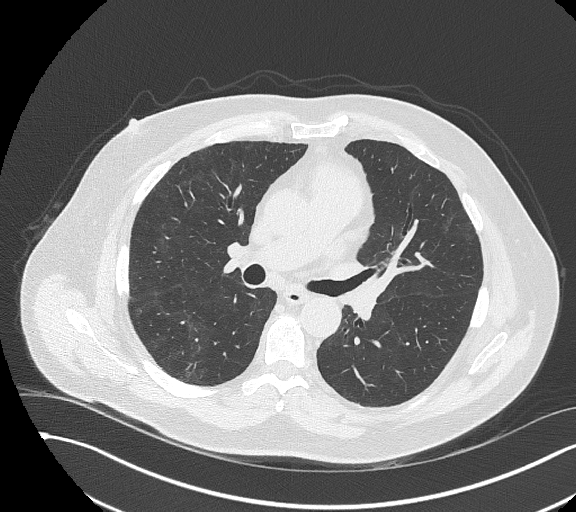
[im 197/320  lung]
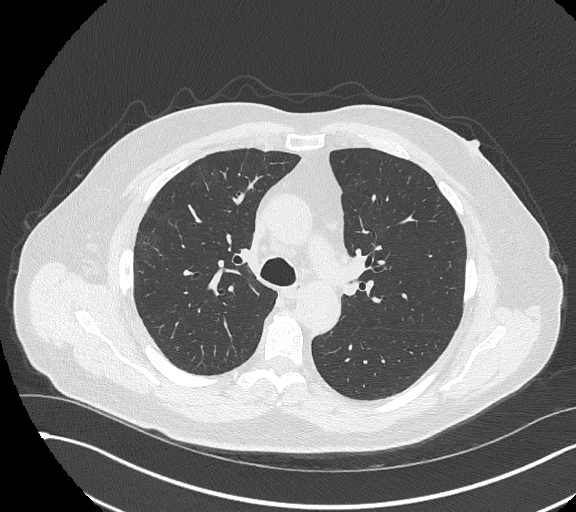
[im 246/320  lung]
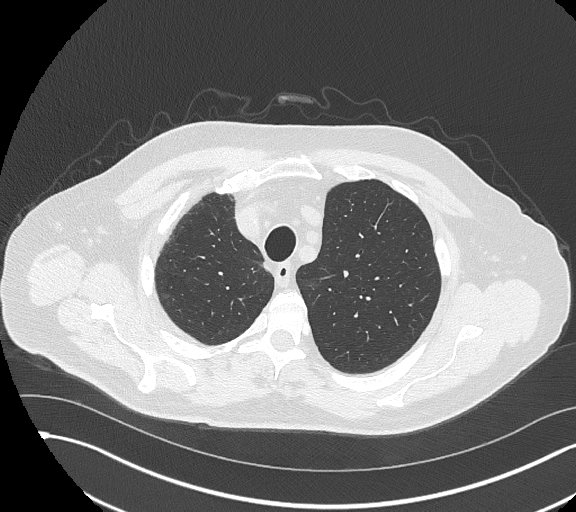
[im 270/320  mediastinal]
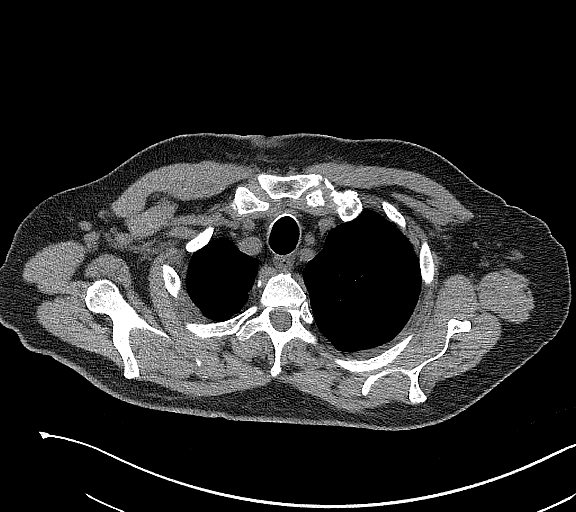
[im 270/320  lung]
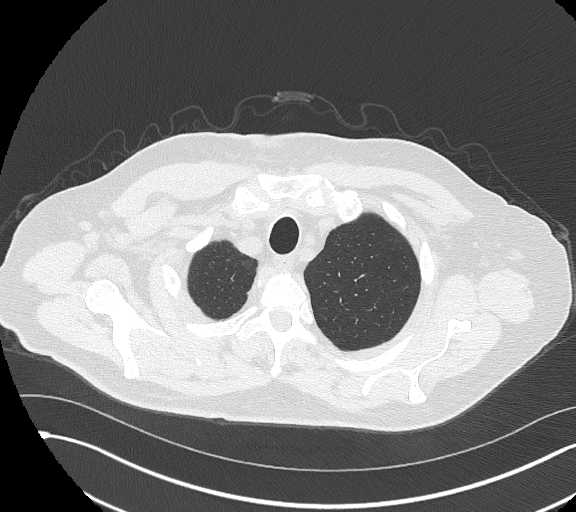
[im 295/320  lung]
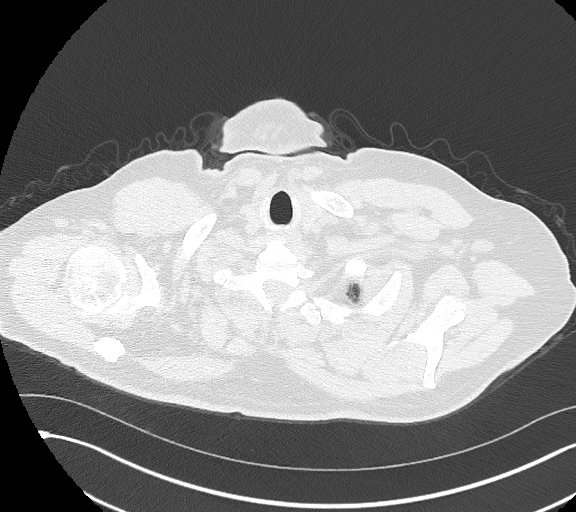

[13 of 36 positions shown; findings below may reference images not displayed]

FINDINGS: Cardiovascular: Normal heart size. No significant pericardial
effusion/thickening. Three-vessel coronary atherosclerosis.
Atherosclerotic nonaneurysmal thoracic aorta. Normal caliber
pulmonary arteries.

Mediastinum/Nodes: No discrete thyroid nodules. Unremarkable
esophagus. No pathologically enlarged axillary, mediastinal or hilar
lymph nodes, noting limited sensitivity for the detection of hilar
adenopathy on this noncontrast study.

Lungs/Pleura: No pneumothorax. No pleural effusion. Anterior right
lower lobe solid 4 mm pulmonary nodule (series 8/image 89), stable,
considered benign. No acute consolidative airspace disease, lung
masses or new significant pulmonary nodules. No significant lobular
air trapping or evidence of tracheobronchomalacia on the expiration
sequences. There is mild-to-moderate patchy peribronchovascular and
peripheral ground-glass opacity and reticulation throughout both
lungs with associated mild traction bronchiolectasis and
architectural distortion. There is a mild basilar gradient to these
findings, which are mildly asymmetrically prominent in the right
lung. Compared to 01/18/2020 chest CT, the ground-glass opacities
and reticulation have improved and the traction bronchiolectasis is
similar. No appreciable progressive findings. No frank honeycombing.

Upper abdomen: Colonic diverticulosis.

Musculoskeletal: No aggressive appearing focal osseous lesions. Mild
thoracic spondylosis.
IMPRESSION: 1. Spectrum of findings compatible with evolving postinflammatory
fibrosis due to the reported history of VVHR3-BY infection. Findings
have improved in the interval. Findings are suggestive of an
alternative diagnosis (not UIP) per consensus guidelines: Diagnosis
of Idiopathic Pulmonary Fibrosis: An Official ATS/ERS/JRS/ALAT
Clinical Practice Guideline. Am J Respir Crit Care Med Vol 198, [place]
5, ppe00-e[DATE].
2. Three-vessel coronary atherosclerosis.
3. Aortic Atherosclerosis (ITDAS-B7G.G).

## 2022-06-17 NOTE — Patient Instructions (Signed)
Visit Information It was great speaking with you today!  Please let me know if you have any questions about our visit.   Goals Addressed             This Visit's Progress    Track and Manage Fluids and Swelling-Heart Failure   On track    Timeframe:  Long-Range Goal Priority:  High Start Date: 12/18/21                            Expected End Date: 12/18/22                      Follow Up within 90 days   - call office if I gain more than 2 pounds in one day or 5 pounds in one week - keep legs up while sitting - use salt in moderation - watch for swelling in feet, ankles and legs every day - weigh myself daily    Why is this important?   It is important to check your weight daily and watch how much salt and liquids you have.  It will help you to manage your heart failure.    Notes:         Patient Care Plan: CCM Pharmacy Care Plan     Problem Identified: Hypertension, Hyperlipidemia, Heart Failure, Coronary Artery Disease, GERD, Depression, Osteoarthritis, and BPH   Priority: High     Long-Range Goal: Patient-Specific Goal   Start Date: 12/17/2021  Expected End Date: 12/18/2022  This Visit's Progress: On track  Recent Progress: On track  Priority: High  Note:   Current Barriers:  No barriers noted   Pharmacist Clinical Goal(s):  Patient will maintain control of hypertension as evidenced by BP less than 140/90  through collaboration with PharmD and provider.   Interventions: 1:1 collaboration with Haydee Salter, MD regarding development and update of comprehensive plan of care as evidenced by provider attestation and co-signature Inter-disciplinary care team collaboration (see longitudinal plan of care) Comprehensive medication review performed; medication list updated in electronic medical record  Heart Failure (Goal: manage symptoms and prevent exacerbations) -Controlled -Last ejection fraction: 40% (Date: 12/09/21) -HF type: Systolic -NYHA Class: II (slight  limitation of activity) -Current treatment: Metoprolol XL 25 mg 1/2 tablet daily: Appropriate, Effective, Safe, Accessible Entresto 24-26 mg twice daily: Appropriate, Effective, Safe, Accessible -Medications previously tried: HCTZ  -Current home BP/HR readings: 133/65, 134/68,  -Has only taken Entresto for a few days, but so far denies new side effects or signs of hypotension.  -Patient reports Entresto 100% covered under insurance and is affordable.  -Recommended to continue current medication  Hyperlipidemia: (LDL goal < 70) -Controlled -Current treatment: Rosuvastatin 20 mg daily  -Current treatment: Aspirin 81 mg daily  Clopidogrel 75 mg daily  -Medications previously tried: NA  -Recommended to continue current medication  Depression/Anxiety (Goal: Maintain symptom remission) -Controlled -Current treatment:  None -Medications previously tried/failed: Paroxetine -PHQ9: 0 -Does occasionally have a glass of wine or whiskey with dinner, counseled patient on risk of alcohol use with paroxetine.  -Recommended to continue current medication  GERD (Goal: Prevent heartburn) -Controlled -Current treatment  Pantoprazole 40 mg daily: Appropriate, Effective, Safe, Accessible  -Medications previously tried: Prilosec -Recommended to continue current medication  BPH (Goal: Minimize urinary symptoms) -Controlled -Current treatment  Tamsulosin 0.4 mg daily: Appropriate, Effective, Safe, Accessible  -Medications previously tried: NA  -Recommended to continue current medication  Allergic Rhinitis (Goal: Prevent  allergy symptoms) -Controlled -Current treatment  Cetirizine 10 mg daily: Appropriate, Effective, Safe, Accessible  Flonase 19mg/act 1 spray twice daily: Appropriate, Effective, Safe, Accessible  -Medications previously tried: NA  -Recommended to continue current medication  Patient Goals/Self-Care Activities Patient will:  - check blood pressure weekly, document, and  provide at future appointments weigh daily, and contact provider if weight gain of greater than 3 pounds in 24 hours  Follow Up Plan: Telephone follow up appointment with care management team member scheduled for:  12/16/2022 at 3:00 PM    Patient agreed to services and verbal consent obtained.   Patient verbalizes understanding of instructions and care plan provided today and agrees to view in [place] status and patient understanding of how to access instructions and care plan via MyChart confirmed with patient.     AJunius Argyle PharmD, BPara March CPP Clinical Pharmacist Practitioner  [facility] [phone]

## 2022-06-17 NOTE — Progress Notes (Signed)
Chronic Care Management Pharmacy Note  06/17/2022 Name:  David Goodman MRN:  [id number] DOB:  [birth year]  Summary: Patient presents for CCM follow-up. Home blood pressures remain controlled.   Recommendations/Changes made from today's visit: Continue current medications  Plan: CPP follow-up 6 months  Subjective: David Goodman is an 81 y.o. year old male who is a primary patient of Rudd, Bertram Millard, MD.  The CCM team was consulted for assistance with disease management and care coordination needs.    Engaged with patient by telephone for follow up visit in response to provider referral for pharmacy case management and/or care coordination services.   Consent to Services:  The patient was given information about Chronic Care Management services, agreed to services, and gave verbal consent prior to initiation of services.  Please see initial visit note for detailed documentation.   Patient Care Team: Loyola Mast, MD as PCP - General (Family Medicine) Corky Crafts, MD as PCP - Cardiology (Cardiology) Gaspar Cola, [facility] as Pharmacist (Pharmacist) Corky Crafts, MD as Consulting Physician (Cardiology) Dimitri Ped, MD as Consulting Physician (Surgery)  Recent office visits: 05/12/22: Patient presented to Dr. Veto Kemps for acute bronchitis 04/28/22: Patient presented to Dr. Veto Kemps for acute bronchitis 04/22/22: Patient presented to Dr. Veto Kemps for acute bronchitis  Recent consult visits: 05/21/22: Patient presented to Ronie Spies, PA-C (Cardiology) for follow-up. 04/28/22: Patient presented to Ronie Spies, PA-C (Cardiology) for follow-up. Aspirin 81 mg   Hospital visits: Admitted to the hospital on 04/11/22 for rectal bleeding   New?Medications Started at [facility] Discharge:?? -started ipratropium 0.6% - started promethazine-dextromethorphan 5 ml    Medication Changes at Hospital Discharge: -Changed None   Medications Discontinued at Hospital  Discharge: -Stopped None   Medications that remain the same after Hospital Discharge:??  -All other medications will remain the same.    Admitted to the hospital on 08/25/2021 due to Coronary artery disease. Discharge date was 08/25/2021. Discharged from [facility].     New?Medications Started at [facility] Discharge:?? -started None   Medication Changes at Hospital Discharge: -Changed None   Medications Discontinued at Hospital Discharge: -Stopped None   Medications that remain the same after Hospital Discharge:??  -All other medications will remain the same.    Admitted to the hospital on 07/28/2021 due to Chronic Systolic Heart Failure. Discharge date was 07/28/2021. Discharged from [facility].     New?Medications Started at [facility] Discharge:?? -started none   Medication Changes at Hospital Discharge: -Changed none   Medications Discontinued at Hospital Discharge: -Stopped none   Medications that remain the same after Hospital Discharge:??  -All other medications will remain the same.    Objective:  Lab Results  Component Value Date   CREATININE 0.89 05/21/2022   BUN 25 05/21/2022   GFR 78.54 04/22/2022   GFRNONAA >60 04/16/2022   GFRAA >60 07/18/2020   NA 138 05/21/2022   K 4.0 05/21/2022   CALCIUM 9.6 05/21/2022   CO2 20 05/21/2022   GLUCOSE 123 (H) 05/21/2022    Lab Results  Component Value Date/Time   HGBA1C 5.6 02/27/2021 09:50 AM   HGBA1C 5.2 07/17/2020 02:12 AM   GFR 78.54 04/22/2022 10:30 AM   GFR 81.92 07/10/2021 10:16 AM    Last diabetic Eye exam:  Lab Results  Component Value Date/Time   HMDIABEYEEXA No Retinopathy 09/01/2021 12:00 AM    Last diabetic Foot exam: No results found for: "HMDIABFOOTEX"   Lab Results  Component Value Date  CHOL 114 12/09/2021   HDL 42 12/09/2021   LDLCALC 41 12/09/2021   LDLDIRECT 129.0 02/27/2021   TRIG 189 (H) 12/09/2021   CHOLHDL 2.7 12/09/2021       Latest Ref  Rng & Units 04/16/2022    2:55 AM 04/12/2022    8:51 AM 04/12/2022   12:46 AM  Hepatic Function  Total Protein 6.5 - 8.1 g/dL 4.5  4.3  4.5   Albumin 3.5 - 5.0 g/dL 2.7  2.7  2.7   AST 15 - 41 U/L 16  14  16    ALT 0 - 44 U/L 13  14  14    Alk Phosphatase 38 - 126 U/L 27  29  30    Total Bilirubin 0.3 - 1.2 mg/dL 1.0  1.2  1.3     Lab Results  Component Value Date/Time   TSH 4.93 05/12/2022 09:52 AM   TSH 6.200 (H) 04/28/2022 03:50 PM   FREET4 1.04 05/12/2022 09:52 AM       Latest Ref Rng & Units 05/21/2022    3:35 PM 04/28/2022    3:50 PM 04/22/2022   10:30 AM  CBC  WBC 3.4 - 10.8 x10E3/uL 9.7  8.1  6.9   Hemoglobin 13.0 - 17.7 g/dL 12.0  9.6  9.4   Hematocrit 37.5 - 51.0 % 36.3  29.6  27.8   Platelets 150 - 450 x10E3/uL 289  371  277.0  C    C Corrected result    Lab Results  Component Value Date/Time   VD25OH 36.47 05/02/2019 08:18 AM   VD25OH 33.23 10/31/2018 08:20 AM    Clinical ASCVD: No  The ASCVD Risk score (Arnett DK, et al., 2019) failed to calculate for the following reasons:   The 2019 ASCVD risk score is only valid for ages 67 to 61       04/22/2022   10:02 AM 05/29/2021    9:11 AM 05/29/2021    8:32 AM  Depression screen PHQ 2/9  Decreased Interest 1 2 2   Down, Depressed, Hopeless 0 2 2  PHQ - 2 Score 1 4 4   Altered sleeping 0 3 3  Tired, decreased energy 3 3 3   Change in appetite 0 0 0  Feeling bad or failure about yourself  0 0 0  Trouble concentrating 0 3 3  Moving slowly or fidgety/restless 0 2 2  Suicidal thoughts 0 0 0  PHQ-9 Score 4 15 15   Difficult doing work/chores Not difficult at all Not difficult at all Not difficult at all    Social History   Tobacco Use  Smoking Status Never  Smokeless Tobacco Never   BP Readings from Last 3 Encounters:  05/21/22 128/60  05/12/22 138/78  04/28/22 (!) 160/82   Pulse Readings from Last 3 Encounters:  05/21/22 84  05/12/22 75  04/28/22 71   Wt Readings from Last 3 Encounters:  05/21/22 165 lb  12.8 oz (75.2 kg)  05/12/22 168 lb 6.4 oz (76.4 kg)  04/28/22 167 lb 12.8 oz (76.1 kg)   BMI Readings from Last 3 Encounters:  05/21/22 27.59 kg/m  05/12/22 28.02 kg/m  04/28/22 27.92 kg/m    Assessment/Interventions: Review of patient past medical history, allergies, medications, health status, including review of consultants reports, laboratory and other test data, was performed as part of comprehensive evaluation and provision of chronic care management services.   SDOH:  (Social Determinants of Health) assessments and interventions performed: Yes   SDOH Screenings   Alcohol Screen: Low  Risk  (11/15/2018)   Alcohol Screen    Last Alcohol Screening Score (AUDIT): 4  Depression (PHQ2-9): Low Risk  (04/22/2022)   Depression (PHQ2-9)    PHQ-2 Score: 4  Financial Resource Strain: Low Risk  (12/18/2021)   Overall Financial Resource Strain (CARDIA)    Difficulty of Paying Living Expenses: Not hard at all  Food Insecurity: Not on file  Housing: Not on file  Physical Activity: Not on file  Social Connections: Not on file  Stress: Not on file  Tobacco Use: Low Risk  (05/21/2022)   Patient History    Smoking Tobacco Use: Never    Smokeless Tobacco Use: Never    Passive Exposure: Not on file  Transportation Needs: No Transportation Needs (01/15/2021)   PRAPARE - Transportation    Lack of Transportation (Medical): No    Lack of Transportation (Non-Medical): No    CCM Care Plan  No Known Allergies  Medications Reviewed Today     Reviewed by Felipa Evener (Physician Assistant Certified) on 74/08/14 at 1515  Med List Status: <None>   Medication Order Taking? Sig Documenting Provider Last Dose Status Informant  Ascorbic Acid (VITAMIN C) 1000 MG tablet [id number] Yes Take 1,000 mg by mouth 2 (two) times a week. [provider] Taking Active Self, Pharmacy Records           Med Note Jacinta Shoe, Imogene Burn Apr 11, 2022  3:04 PM)    aspirin EC 81 MG tablet [id number]  Yes Take 1 tablet (81 mg total) by mouth daily. Swallow whole. Charlie Pitter, PA-C Taking Active   cetirizine (ZYRTEC) 10 MG tablet [id number] Yes Take 1 tablet (10 mg total) by mouth daily. Haydee Salter, MD Taking Active Self, Pharmacy Records  fluticasone [facility]) 50 MCG/ACT nasal spray [id number] Yes Place 1 spray into both nostrils 2 (two) times daily. Haydee Salter, MD Taking Active Self, Pharmacy Records  guaiFENesin-codeine 100-10 MG/5ML syrup [id number] Yes Take 5 mLs by mouth 3 (three) times daily as needed for cough. Haydee Salter, MD Taking Active   latanoprost (XALATAN) 0.005 % ophthalmic solution [id number] Yes Place 1 drop into both eyes at bedtime. Marletta Lor, MD Taking Active Self, Pharmacy Records  MAGNESIUM PO [id number] Yes Take 400 mg by mouth at bedtime. [provider] Taking Active Self, Pharmacy Records  metoprolol succinate (TOPROL XL) 25 MG 24 hr tablet [id number] Yes Take 1 tablet (25 mg total) by mouth daily. Liliane Shi, PA-C Taking Active Self, Pharmacy Records  Multiple Vitamin (MULTIVITAMIN WITH MINERALS) TABS tablet [id number] Yes Take 1 tablet by mouth daily. [provider] Taking Active Self, Pharmacy Records           Med Note (CRUTHIS, Imogene Burn Apr 11, 2022  2:17 PM)    nitroGLYCERIN (NITROSTAT) 0.4 MG SL tablet [id number] Yes Place 1 tablet (0.4 mg total) under the tongue every 5 (five) minutes as needed for chest pain. Jettie Booze, MD Taking Active Self, Pharmacy Records  pantoprazole (PROTONIX) 40 MG tablet [id number] Yes Take 1 tablet (40 mg total) by mouth daily. Jettie Booze, MD Taking Active   Polyvinyl Alcohol-Povidone [facility]) [id number] Yes Place 1 drop into the left eye 2 (two) times daily as needed (dryness). [provider] Taking Active Self, Pharmacy Records  psyllium (METAMUCIL) 58.6 % powder [id number] Yes Take 1 packet by mouth daily. [provider] Taking Active Self,  Pharmacy Records  rosuvastatin (Venturia)  20 MG tablet [id number] Yes Take 1 tablet (20 mg total) by mouth daily. Cheryln Manly, NP Taking Active Self, Pharmacy Records  sacubitril-valsartan [facility]) 24-26 [place] [id number] Yes Take 1 tablet by mouth 2 (two) times daily. Charlie Pitter, PA-C Taking Active   tadalafil (CIALIS) 20 MG tablet [id number] Yes Take 1 tablet (20 mg total) by mouth daily as needed for erectile dysfunction. 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   Vivid dream 09/21/2021   Excessive daytime sleepiness 09/21/2021   Snoring 09/21/2021   Coronary artery disease involving native coronary artery of native heart 09/21/2021   SOBOE (shortness of breath on exertion) 09/21/2021   Coronary artery disease    HFrEF (heart failure with reduced ejection fraction) ([place])    Bell's palsy, left 06/05/2021   Cerebrovascular disease 06/05/2021   Hiatal hernia 05/29/2021   Anemia due to blood loss 02/27/2021   Glaucoma 02/27/2021   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   Erectile dysfunction due to arterial insufficiency 02/27/2021   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   Hemorrhoids 07/17/2020   RLS (restless legs syndrome) 10/17/2019   Syncope 11/15/2018   Diverticulitis 05/25/2018   Osteoarthritis of right knee 12/13/2017   Osteoarthritis of left knee 12/13/2017   Testosterone deficiency 03/22/2017   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/27/2016   GERD (gastroesophageal reflux disease) 09/27/2016   BPH (benign prostatic hyperplasia) 06/18/2013   HLD (hyperlipidemia) 07/13/2007   Essential hypertension  07/13/2007   Allergic rhinitis 07/13/2007   Peptic ulcer disease 07/13/2007   History of nephrolithiasis 07/13/2007    Immunization History  Administered Date(s) Administered   Fluad Quad(high Dose 65+) 08/10/2019   Influenza, High Dose Seasonal PF 07/31/2015, 09/27/2016, 10/10/2017, 09/30/2018   Influenza,inj,quad, With Preservative 09/30/2018   Influenza-Unspecified 09/14/2017, 09/30/2018, 10/28/2020   PFIZER(Purple Top)SARS-COV-2 Vaccination 01/21/2020, 02/22/2020, 10/28/2020   Pneumococcal Conjugate-13 05/19/2015   Pneumococcal Polysaccharide-23 07/16/2009, 09/30/2018   Pneumococcal-Unspecified 09/30/2018   Td 07/16/2009   Tdap 05/19/2015   Zoster, Live 04/19/2011    Conditions to be addressed/monitored:  Hypertension, Hyperlipidemia, Heart Failure, Coronary Artery Disease, GERD, Depression, Osteoarthritis, and BPH  Care Plan : [place]  Updates made by Germaine Pomfret, RPH since 06/17/2022 12:00 AM     Problem: Hypertension, Hyperlipidemia, Heart Failure, Coronary Artery Disease, GERD, Depression, Osteoarthritis, and BPH   Priority: High     Long-Range Goal: Patient-Specific Goal   Start Date: 12/17/2021  Expected End Date: 12/18/2022  This Visit's Progress: On track  Recent Progress: On track  Priority: High  Note:   Current Barriers:  No barriers noted   Pharmacist Clinical Goal(s):  Patient will maintain control of hypertension as evidenced by BP less than 140/90  through collaboration with PharmD and provider.   Interventions: 1:1 collaboration with Haydee Salter, MD regarding development and update of comprehensive plan of care as evidenced by provider attestation and co-signature Inter-disciplinary care team collaboration (see longitudinal plan of care) Comprehensive medication review performed; medication list updated in electronic medical record  Heart Failure (Goal: manage symptoms and prevent exacerbations) -Controlled -Last ejection  fraction: 40% (Date: 12/09/21) -  HF type: Systolic -NYHA Class: II (slight limitation of activity) -Current treatment: Metoprolol XL 25 mg 1/2 tablet daily: Appropriate, Effective, Safe, Accessible Entresto 24-26 mg twice daily: Appropriate, Effective, Safe, Accessible -Medications previously tried: HCTZ  -Current home BP/HR readings: 133/65, 134/68,  -Has only taken Entresto for a few days, but so far denies new side effects or signs of hypotension.  -Patient reports Entresto 100% covered under insurance and is affordable.  -Recommended to continue current medication  Hyperlipidemia: (LDL goal < 70) -Controlled -Current treatment: Rosuvastatin 20 mg daily  -Current treatment: Aspirin 81 mg daily  Clopidogrel 75 mg daily  -Medications previously tried: NA  -Recommended to continue current medication  Depression/Anxiety (Goal: Maintain symptom remission) -Controlled -Current treatment:  None -Medications previously tried/failed: Paroxetine -PHQ9: 0 -Does occasionally have a glass of wine or whiskey with dinner, counseled patient on risk of alcohol use with paroxetine.  -Recommended to continue current medication  GERD (Goal: Prevent heartburn) -Controlled -Current treatment  Pantoprazole 40 mg daily: Appropriate, Effective, Safe, Accessible  -Medications previously tried: Prilosec -Recommended to continue current medication  BPH (Goal: Minimize urinary symptoms) -Controlled -Current treatment  Tamsulosin 0.4 mg daily: Appropriate, Effective, Safe, Accessible  -Medications previously tried: NA  -Recommended to continue current medication  Allergic Rhinitis (Goal: Prevent allergy symptoms) -Controlled -Current treatment  Cetirizine 10 mg daily: Appropriate, Effective, Safe, Accessible  Flonase 50mcg/act 1 spray twice daily: Appropriate, Effective, Safe, Accessible  -Medications previously tried: NA  -Recommended to continue current medication  Patient Goals/Self-Care  Activities Patient will:  - check blood pressure weekly, document, and provide at future appointments weigh daily, and contact provider if weight gain of greater than 3 pounds in 24 hours  Follow Up Plan: Telephone follow up appointment with care management team member scheduled for:  12/16/2022 at 3:00 PM       Medication Assistance: None required.  Patient affirms current coverage meets needs.  Compliance/Adherence/Medication fill history: Care Gaps: Entresto 24-26 mg last filled 12/10/2021 30 day supply at [facility].  Rosuvastatin 20 mg last filled 10/14/2021 90 day supply at [facility].   Star-Rating Drugs: Shingrix Vaccine COVID-19 Vaccine Influenza Vaccine  Patient's preferred pharmacy is:  [facility] ([place], [facility] [address] [place] [address] Phone: [phone] Fax: [phone]  [facility] ([facility] ) - [place], [place] [address] Phone: [phone] Fax: [phone]  [facility] [address] [place], [place] [address] [place] [address] Phone: [phone] Fax: [phone]  [place], [place] [address] Phone: [phone] Fax: [phone]  Uses pill box? Yes Pt endorses 100% compliance  We discussed: Current pharmacy is preferred with insurance plan and patient is satisfied with pharmacy services Patient decided to: Continue current medication management strategy  Care Plan and Follow Up Patient Decision:  Patient agrees to Care Plan and Follow-up.  Plan: Telephone follow up appointment with care management team member scheduled for:  12/16/2022 at 3:00 PM  Junius Argyle, PharmD, Para March, CPP Clinical Pharmacist Practitioner  [facility]  [phone]

## 2022-06-22 ENCOUNTER — Telehealth: Payer: Self-pay | Admitting: Interventional Cardiology

## 2022-06-22 NOTE — Telephone Encounter (Signed)
Pt c/o medication issue:  1. Name of Medication:   sacubitril-valsartan (ENTRESTO) 24-26 MG  2. How are you currently taking this medication (dosage and times per day)?  As prescribed  3. Are you having a reaction (difficulty breathing--STAT)?   No  4. What is your medication issue?   Patient stated that this medication is now too expensive for him.  He would like to get assistance with getting this medication or get an alternate medication.

## 2022-06-22 NOTE — Telephone Encounter (Signed)
**Note De-Identified  Obfuscation** The pt states that he currently has 15 days of Entresto on hand.  Per his request we have left him a [employer] pt asst application in the front office for him to pick up.  He is aware to complete his part of the application, to obtain the required documents per [employer] pt asst [employer], and to bring all back to Dr Hassell Done office at [employer]	[place] to drop off and that we will take care of the providers page of his application and we will fax all to [facility].  He thanked me for our assistance.

## 2022-06-22 NOTE — Telephone Encounter (Signed)
Pt is requesting pt assistance with Entresto. Please address

## 2022-06-28 DIAGNOSIS — I5020 Unspecified systolic (congestive) heart failure: Secondary | ICD-10-CM | POA: Diagnosis not present

## 2022-06-28 DIAGNOSIS — E785 Hyperlipidemia, unspecified: Secondary | ICD-10-CM | POA: Diagnosis not present

## 2022-07-02 ENCOUNTER — Telehealth: Payer: Self-pay | Admitting: Family Medicine

## 2022-07-02 NOTE — Telephone Encounter (Signed)
Caller Name: Pharmacy Call back phone #:  [phone]  Reason for Call: Pharmacy asked that we please send over a prescription over to fill Rosuvastatin, please send to [address] [place], [place] [address]

## 2022-07-05 MED ORDER — ROSUVASTATIN CALCIUM 20 MG PO TABS: 20.0000 mg | ORAL_TABLET | Freq: Every day | ORAL | 1 refills | Status: DC

## 2022-07-05 NOTE — Telephone Encounter (Signed)
Patient has been getting 90 day supply and oly had 30 tabs left.  RX sent to pharmacy(appt 8/24/2)  patient notified [place] phone. Dm/cma

## 2022-07-12 DIAGNOSIS — G4733 Obstructive sleep apnea (adult) (pediatric): Secondary | ICD-10-CM | POA: Diagnosis not present

## 2022-07-15 DIAGNOSIS — G4733 Obstructive sleep apnea (adult) (pediatric): Secondary | ICD-10-CM | POA: Diagnosis not present

## 2022-07-22 ENCOUNTER — Encounter: Payer: Self-pay | Admitting: Family Medicine

## 2022-07-22 ENCOUNTER — Telehealth: Payer: Self-pay

## 2022-07-22 ENCOUNTER — Ambulatory Visit (INDEPENDENT_AMBULATORY_CARE_PROVIDER_SITE_OTHER): Payer: Medicare Other | Admitting: Family Medicine

## 2022-07-22 VITALS — BP 122/70 | HR 70 | Temp 97.8°F | Ht 65.0 in | Wt 170.0 lb

## 2022-07-22 DIAGNOSIS — I5020 Unspecified systolic (congestive) heart failure: Secondary | ICD-10-CM

## 2022-07-22 DIAGNOSIS — Z860100 Personal history of colon polyps, unspecified: Secondary | ICD-10-CM | POA: Insufficient documentation

## 2022-07-22 DIAGNOSIS — I10 Essential (primary) hypertension: Secondary | ICD-10-CM

## 2022-07-22 DIAGNOSIS — K5730 Diverticulosis of large intestine without perforation or abscess without bleeding: Secondary | ICD-10-CM | POA: Insufficient documentation

## 2022-07-22 DIAGNOSIS — I951 Orthostatic hypotension: Secondary | ICD-10-CM | POA: Diagnosis not present

## 2022-07-22 DIAGNOSIS — K2100 Gastro-esophageal reflux disease with esophagitis, without bleeding: Secondary | ICD-10-CM | POA: Insufficient documentation

## 2022-07-22 DIAGNOSIS — I2510 Atherosclerotic heart disease of native coronary artery without angina pectoris: Secondary | ICD-10-CM | POA: Diagnosis not present

## 2022-07-22 DIAGNOSIS — J301 Allergic rhinitis due to pollen: Secondary | ICD-10-CM | POA: Diagnosis not present

## 2022-07-22 DIAGNOSIS — D500 Iron deficiency anemia secondary to blood loss (chronic): Secondary | ICD-10-CM | POA: Insufficient documentation

## 2022-07-22 DIAGNOSIS — K317 Polyp of stomach and duodenum: Secondary | ICD-10-CM | POA: Insufficient documentation

## 2022-07-22 MED ORDER — FLUTICASONE PROPIONATE 50 MCG/ACT NA SUSP: 1.0000 | Freq: Two times a day (BID) | NASAL | 6 refills | Status: DC

## 2022-07-22 NOTE — Patient Instructions (Signed)
Discuss with Dr. Irish Lack about your difficulty affording Entresto (sacubitril-valsartan). Ask about could you switch to valsartan or similar alone.

## 2022-07-22 NOTE — Progress Notes (Signed)
[facility] [address] [place] [address] Dept: [phone] Dept Fax: [phone]  Chronic Care Office Visit  Subjective:    [name], male    DOB: 01/30/1941, 81 y.o..   MRN: [id number]  Chief Complaint  Patient presents with   Follow-up    3 month f/u.       History of Present Illness:  Patient is in today for reassessment of chronic medical issues.  Mr. Belay has a history of hypertension, CAD, and HFrEF. He is managed on a daily aspirin, metoprolol 25 mg daily and Entresto 24-26 mg daily. He notes that he is having difficulty affording the [facility], as he has not qualified for patient assistance with this. He also complains of feeling lightheaded when up and around. he did start going tot he gym. He has been walking on the treadmill at 2.5 mph on a 0% grade for 10 minutes. he notes he has some dyspnea with this. He also has done 15 minutes on the stationary bike and seems to tolerate this okay. he has not checked his pulse while exercising. He did go to [place] abotu 3 weeks ago. They were able to do some sightseeing and he feels he got around well with this.  Mr. Oberholzer has a history of allergic rhinitis. he is manage don Flonase and Zyrtec. Despite this, he notes he has some persistent rhinorrhea and itchy/watery eyes. he also does complain of a dry cough.  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   Vivid dream 09/21/2021   Excessive daytime sleepiness 09/21/2021   Snoring 09/21/2021   Coronary artery disease involving native coronary  artery of native heart 09/21/2021   SOBOE (shortness of breath on exertion) 09/21/2021   Coronary artery disease    HFrEF (heart failure with reduced ejection fraction) ([place])    Bell's palsy, left 06/05/2021   Cerebrovascular disease 06/05/2021   Hiatal hernia 05/29/2021   Glaucoma 02/27/2021   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   Erectile dysfunction due to arterial insufficiency 02/27/2021   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   Hemorrhoids 07/17/2020   RLS (restless legs syndrome) 10/17/2019   Syncope 11/15/2018   Diverticulitis 05/25/2018   Osteoarthritis of right knee 12/13/2017   Osteoarthritis of left knee 12/13/2017   Testosterone deficiency 03/22/2017   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/27/2016   BPH (benign prostatic hyperplasia) 06/18/2013   HLD (hyperlipidemia) 07/13/2007   Essential hypertension 07/13/2007   Allergic rhinitis 07/13/2007   Peptic ulcer disease 07/13/2007   History of nephrolithiasis 07/13/2007   Past Surgical History:  Procedure Laterality Date   BROW LIFT Bilateral 11/10/2020   Procedure: BLEPHAROPLASTY;  Surgeon: [name], MD;  Location: [facility];  Service: Plastics;  Laterality: Bilateral;  1 hour   COLONOSCOPY N/A 04/12/2022   Procedure: COLONOSCOPY;  Surgeon: Arta Silence, MD;  [facility];  Service: Gastroenterology;  Laterality: N/A;   COLONOSCOPY WITH PROPOFOL N/A 02/17/2019   Procedure: COLONOSCOPY WITH PROPOFOL;  Surgeon: Ronnette Juniper, MD;  [facility];  Service: Gastroenterology;  Laterality: N/A;   CORONARY STENT INTERVENTION  N/A 08/25/2021   Procedure: CORONARY STENT INTERVENTION;  Surgeon: Jettie Booze, MD;  Location: [facility];  Service: Cardiovascular;  Laterality: N/A;   HYDROCELE EXCISION Right 05/15/2003   INTRAVASCULAR LITHOTRIPSY  08/25/2021   Procedure: INTRAVASCULAR LITHOTRIPSY;  Surgeon: Jettie Booze, MD;   Location: [facility];  Service: Cardiovascular;;   INTRAVASCULAR ULTRASOUND/IVUS N/A 08/25/2021   Procedure: Intravascular Ultrasound/IVUS;  Surgeon: Jettie Booze, MD;  Location: [facility];  Service: Cardiovascular;  Laterality: N/A;   IR ANGIOGRAM VISCERAL SELECTIVE  02/16/2019   IR ANGIOGRAM VISCERAL SELECTIVE  02/16/2019   IR ANGIOGRAM VISCERAL SELECTIVE  02/16/2019   IR US GUIDE VASC ACCESS RIGHT  02/16/2019   LEFT HEART CATH AND CORONARY ANGIOGRAPHY N/A 07/28/2021   Procedure: LEFT HEART CATH AND CORONARY ANGIOGRAPHY;  Surgeon: Jettie Booze, MD;  Location: [facility];  Service: Cardiovascular;  Laterality: N/A;   LEFT HEART CATH AND CORONARY ANGIOGRAPHY N/A 08/25/2021   Procedure: LEFT HEART CATH AND CORONARY ANGIOGRAPHY;  Surgeon: Jettie Booze, MD;  Location: [place] [facility];  Service: Cardiovascular;  Laterality: N/A;   ROTATOR CUFF REPAIR     SPERMATOCELECTOMY Right 05/15/2003   Family History  Problem Relation Age of Onset   Cancer Brother    Outpatient Medications Prior to Visit  Medication Sig Dispense Refill   Ascorbic Acid (VITAMIN C) 1000 MG tablet Take 1,000 mg by mouth 2 (two) times a week.     aspirin EC 81 MG tablet Take 1 tablet (81 mg total) by mouth daily. Swallow whole. 90 tablet 3   cetirizine (ZYRTEC) 10 MG tablet Take 1 tablet (10 mg total) by mouth daily. 30 tablet 11   latanoprost (XALATAN) 0.005 % ophthalmic solution Place 1 drop into both eyes at bedtime. 7.5 mL 1   MAGNESIUM PO Take 400 mg by mouth at bedtime.     metoprolol succinate (TOPROL XL) 25 MG 24 hr tablet Take 1 tablet (25 mg total) by mouth daily. 90 tablet 3   Multiple Vitamin (MULTIVITAMIN WITH MINERALS) TABS tablet Take 1 tablet by mouth daily.     nitroGLYCERIN (NITROSTAT) 0.4 MG SL tablet Place 1 tablet (0.4 mg total) under the tongue every 5 (five) minutes as needed for chest pain. 25 tablet 3   pantoprazole (PROTONIX) 40 MG tablet Take 1 tablet  (40 mg total) by mouth daily. 30 tablet 11   Polyvinyl Alcohol-Povidone (REFRESH OP) Place 1 drop into the left eye 2 (two) times daily as needed (dryness).     psyllium (METAMUCIL) 58.6 % powder Take 1 packet by mouth daily.     rosuvastatin (CRESTOR) 20 MG tablet Take 1 tablet (20 mg total) by mouth daily. 30 tablet 1   sacubitril-valsartan (ENTRESTO) 24-26 MG Take 1 tablet by mouth 2 (two) times daily. 60 tablet 2   tadalafil (CIALIS) 20 MG tablet Take 1 tablet (20 mg total) by mouth daily as needed for erectile dysfunction. 30 tablet 2   tamsulosin (FLOMAX) 0.4 MG CAPS capsule TAKE 1 CAPSULE BY MOUTH  DAILY 90 capsule 3   fluticasone (FLONASE) 50 MCG/ACT nasal spray Place 1 spray into both nostrils 2 (two) times daily. 15.8 mL 6   guaiFENesin-codeine 100-10 MG/5ML syrup Take 5 mLs by mouth 3 (three) times daily as needed for cough. 120 mL 0   No facility-administered medications prior to visit.   No Known Allergies    Objective:   Today's Vitals   07/22/22  1012  BP: 122/70  Pulse: 70  Temp: 97.8 F (36.6 C)  TempSrc: Temporal  SpO2: 97%  Weight: 170 lb (77.1 kg)  Height: '5\' 5"'$  (1.651 m)   Body mass index is 28.29 kg/m.   General: Well developed, well nourished. No acute distress. Psych: Alert and oriented. Normal mood and affect.  Health Maintenance Due  Topic Date Due   Zoster Vaccines- Shingrix (1 of 2) Never done   INFLUENZA VACCINE  06/29/2022     Assessment & Plan:   1. Essential hypertension Blood pressure is at goal. He will continue on his metoprolol for now, as this helps his heart failure. I do wonder if he could back off on some of his medications to help with his orthostasis.  2. Coronary artery disease involving native coronary artery of native heart, unspecified whether angina present Stable. No complaints of chest pain. I did review with him about how to check his pulse on standard exercise equipment. he should be trying to keep his active heart rate  between 100-115. We discussed adjusting his speed of walking to help keep this in range.  - Ambulatory referral to Cardiology  3. HFrEF (heart failure with reduced ejection fraction) ([place]) Compensated. I will have him go back to see his cardiologist to discuss his medication cost. I wonder if he might do well enough on just and ARB rather than the Entresto.  - Ambulatory referral to Cardiology  4. Orthostasis As above. To review with cardiology./  - Ambulatory referral to Cardiology  5. Seasonal allergic rhinitis due to pollen As he is not being well controled on his current regimen, I will refer Mr. Alcocer to an Allergist to consider other therapies that may help improve his symptoms.  - fluticasone (FLONASE) 50 MCG/ACT nasal spray; Place 1 spray into both nostrils 2 (two) times daily.  Dispense: 15.8 mL; Refill: 6 - Ambulatory referral to Allergy   Return in about 3 months (around 10/22/2022) for Reassessment.   Haydee Salter, MD

## 2022-07-22 NOTE — Telephone Encounter (Signed)
Patient was in the office today for a visit and had mentioned that when he talked to you before that you could help him with getting the [facility] cheaper for him   he was told by the pharmacy that it would cost him $535.00.  Please review and advise.   Thanks. Dm/cma

## 2022-07-26 ENCOUNTER — Telehealth: Payer: Self-pay

## 2022-07-26 NOTE — Progress Notes (Unsigned)
Cardiology Office Note   Date:  07/27/2022   ID:  David Goodman, DOB [birth year], MRN [id number]  PCP:  Haydee Salter, MD    No chief complaint on file.  CAD  Wt Readings from Last 3 Encounters:  07/27/22 168 lb (76.2 kg)  07/22/22 170 lb (77.1 kg)  05/21/22 165 lb 12.8 oz (75.2 kg)       History of Present Illness: David Goodman is a 81 y.o. male   with:  Coronary artery disease  S/p DES to p-mLAD in 9/22 Cath 9/22: OM1 80, D2 80, dLAD 50, mRCA 60 >> Med Rx HFmrEF (heart failure with mildly reduced ejection fraction)  Echocardiogram 8/22: 40-45, Gr 1 DD, global HK Mild to mod MR (echocardiogram 8/22) Hypertension  Hyperlipidemia  Glucose intolerance GERD/PUD Iron deficiency anemia Aortic atherosclerosis  Echo: "07/07/2021 EF 40-45, global HK, mild LVH, GR 1 DD, normal RVSF, mild LAE, mild-moderate MR, mild AI, trivial TR, mild PI"   Prior CV studies: CORONARY STENT INTERVENTION, INTRAVASCULAR LITHOTRIPSY 08/25/2021 Narrative   Prox LAD to Mid LAD lesion is 75% stenosed.   After shockwave lithotripsy, a drug-eluting stent was successfully placed using a STENT ONYX FRONTIER 3.0X38, postdilated to 4 mm.  The stent was optimized with intravascular ultrasound.   Post intervention, there is a 0% residual stenosis.   Moderate diffuse disease noted in the remainder of the LAD.   LV end diastolic pressure is normal.   There is no aortic valve stenosis.   F/u echo showed: "EF has not improved since PCI.  It remains ~ 40%.  Mitral regurgitation is also stable.  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  His BP should be able to tolerate changing Losartan to Entresto. PLAN:  - DC Losartan - Entresto 24/26 mg twice daily  - BMET at f/u with Dr. Irish Lack later this month. - Arrange Chest/Aorta CTA to size aorta better - Keep f/u with Dr. Irish Lack later this month to discuss other med adjustments for HF"      Past Medical History:  Diagnosis Date   ALLERGIC RHINITIS 07/13/2007    Aorta dilated on Echo; Normal sized Aorta on CT    Chest CT 1/23: No thoracic aortic aneurysm; coronary calcifications; aortic atherosclerosis; improved aeration of lungs with persistent minimal reticular opacities   CAD (coronary artery disease)    GERD (gastroesophageal reflux disease)    GI bleed    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) ([place])    HYPERLIPIDEMIA 07/13/2007   HYPERTENSION 07/13/2007   Impaired glucose tolerance 09/27/2016   Iron deficiency anemia due to chronic blood loss 02/27/2021   NEPHROLITHIASIS, 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   NSVT (nonsustained ventricular tachycardia) ([place])    PEPTIC ULCER DISEASE 07/13/2007   PVC's (premature ventricular contractions)    SVT (supraventricular tachycardia) ([place])    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    Past Surgical History:  Procedure Laterality Date   BROW LIFT Bilateral 11/10/2020   Procedure: BLEPHAROPLASTY;  Surgeon: [name], MD;  Location: [facility];  Service: Plastics;  Laterality: Bilateral;  1 hour   COLONOSCOPY N/A 04/12/2022   Procedure: COLONOSCOPY;  Surgeon: Arta Silence, MD;  [facility];  Service: Gastroenterology;  Laterality: N/A;   COLONOSCOPY WITH PROPOFOL N/A 02/17/2019   Procedure: COLONOSCOPY WITH PROPOFOL;  Surgeon: Ronnette Juniper, MD;  [facility];  Service: Gastroenterology;  Laterality: N/A;   CORONARY STENT INTERVENTION N/A 08/25/2021   Procedure: CORONARY STENT INTERVENTION;  Surgeon: Jettie Booze, MD;  Location: [facility];  Service: Cardiovascular;  Laterality: N/A;   HYDROCELE EXCISION Right 05/15/2003   INTRAVASCULAR LITHOTRIPSY  08/25/2021   Procedure: INTRAVASCULAR LITHOTRIPSY;  Surgeon: Jettie Booze, MD;  Location: [facility];  Service: Cardiovascular;;   INTRAVASCULAR ULTRASOUND/IVUS N/A 08/25/2021   Procedure: Intravascular Ultrasound/IVUS;  Surgeon: Jettie Booze, MD;  Location: [facility] [facility];   Service: Cardiovascular;  Laterality: N/A;   IR ANGIOGRAM VISCERAL SELECTIVE  02/16/2019   IR ANGIOGRAM VISCERAL SELECTIVE  02/16/2019   IR ANGIOGRAM VISCERAL SELECTIVE  02/16/2019   IR US GUIDE VASC ACCESS RIGHT  02/16/2019   LEFT HEART CATH AND CORONARY ANGIOGRAPHY N/A 07/28/2021   Procedure: LEFT HEART CATH AND CORONARY ANGIOGRAPHY;  Surgeon: Jettie Booze, MD;  Location: [place];  Service: Cardiovascular;  Laterality: N/A;   LEFT HEART CATH AND CORONARY ANGIOGRAPHY N/A 08/25/2021   Procedure: LEFT HEART CATH AND CORONARY ANGIOGRAPHY;  Surgeon: Jettie Booze, MD;  Location: [facility];  Service: Cardiovascular;  Laterality: N/A;   ROTATOR CUFF REPAIR     SPERMATOCELECTOMY Right 05/15/2003     Current Outpatient Medications  Medication Sig Dispense Refill   Ascorbic Acid (VITAMIN C) 1000 MG tablet Take 1,000 mg by mouth 2 (two) times a week.     aspirin EC 81 MG tablet Take 1 tablet (81 mg total) by mouth daily. Swallow whole. 90 tablet 3   cetirizine (ZYRTEC) 10 MG tablet Take 1 tablet (10 mg total) by mouth daily. 30 tablet 11   fluticasone (FLONASE) 50 MCG/ACT nasal spray Place 1 spray into both nostrils 2 (two) times daily. 15.8 mL 6   latanoprost (XALATAN) 0.005 % ophthalmic solution Place 1 drop into both eyes at bedtime. 7.5 mL 1   MAGNESIUM PO Take 400 mg by mouth at bedtime.     metoprolol succinate (TOPROL XL) 25 MG 24 hr tablet Take 1 tablet (25 mg total) by mouth daily. 90 tablet 3   Multiple Vitamin (MULTIVITAMIN WITH MINERALS) TABS tablet Take 1 tablet by mouth daily.     nitroGLYCERIN (NITROSTAT) 0.4 MG SL tablet Place 1 tablet (0.4 mg total) under the tongue every 5 (five) minutes as needed for chest pain. 25 tablet 3   pantoprazole (PROTONIX) 40 MG tablet Take 1 tablet (40 mg total) by mouth daily. 30 tablet 11   Polyvinyl Alcohol-Povidone (REFRESH OP) Place 1 drop into the left eye 2 (two) times daily as needed (dryness).     psyllium  (METAMUCIL) 58.6 % powder Take 1 packet by mouth daily.     rosuvastatin (CRESTOR) 20 MG tablet Take 1 tablet (20 mg total) by mouth daily. 30 tablet 1   sacubitril-valsartan (ENTRESTO) 24-26 MG Take 1 tablet by mouth 2 (two) times daily. 60 tablet 2   tadalafil (CIALIS) 20 MG tablet Take 1 tablet (20 mg total) by mouth daily as needed for erectile dysfunction. 30 tablet 2   tamsulosin (FLOMAX) 0.4 MG CAPS capsule TAKE 1 CAPSULE BY MOUTH  DAILY 90 capsule 3   No current facility-administered medications for this visit.    Allergies:   Patient has no known allergies.    Social History:  The patient  reports that he has never smoked. He has never used smokeless tobacco. He reports current alcohol use. He reports that he does not use drugs.   Family History:  The patient's family history includes Cancer in his brother.    ROS:  Please see the  history of present illness.   Otherwise, review of systems are positive for dizziness when standing from a bended position.   All other systems are reviewed and negative.    PHYSICAL EXAM: VS:  BP 122/78   Pulse 68   Ht '5\' 5"'$  (1.651 m)   Wt 168 lb (76.2 kg)   SpO2 97%   BMI 27.96 kg/m  , BMI Body mass index is 27.96 kg/m. GEN: Well nourished, well developed, in no acute distress HEENT: normal Neck: no JVD, carotid bruits, or masses Cardiac: RRR; no murmurs, rubs, or gallops,no edema  Respiratory:  clear to auscultation bilaterally, normal work of breathing GI: soft, nontender, nondistended, + BS MS: no deformity or atrophy Skin: warm and dry, no rash Neuro:  Strength and sensation are intact Psych: euthymic mood, full affect   EKG:   The ekg ordered 04/15/22 demonstrates NSR, ILBBB   Recent Labs: 04/16/2022: ALT 13 04/28/2022: Magnesium 2.3 05/12/2022: TSH 4.93 05/21/2022: BUN 25; Creatinine, Ser 0.89; Hemoglobin 12.0; Platelets 289; Potassium 4.0; Sodium 138   Lipid Panel    Component Value Date/Time   CHOL 114 12/09/2021 0838    TRIG 189 (H) 12/09/2021 0838   HDL 42 12/09/2021 0838   CHOLHDL 2.7 12/09/2021 0838   CHOLHDL 4 02/27/2021 0950   VLDL 46.4 (H) 02/27/2021 0950   LDLCALC 41 12/09/2021 0838   LDLDIRECT 129.0 02/27/2021 0950     Other studies Reviewed: Additional studies/ records that were reviewed today with results demonstrating: labs reviewed.   ASSESSMENT AND PLAN:  CAD: No angina on medical therapy.  Continue aggressive secondary prevention.  Exercise tolerance is improving as he does more activity.  He is up to 20 minutes on the treadmill and 20 minutes on the bike. Chronic systolic heart failure: Delene Loll is too expensive.  We discussed trying a patient assistance program but he does not think he will qualify.  Will substitute Entresto with valsartan 320 mg daily.  In the past, he was on losartan 100 mg daily.  If he has worsening dizziness, he can decrease the valsartan to 160 mg daily. Dizziness: He is going to try holding his Flomax for a few days to see if the dizziness improves.  Stay well-hydrated. HTN: The current medical regimen is effective;  continue present plan and medications.  Blood pressure in the 120s at home.  No low blood pressure readings. Hyperlipidemia: LDL 41 in January 2023. PVC: No sx. Noted on exam. Dilated aorta by echo but normal by CT.  No need for additional imaging.  OSA: using CPAP.  Trying to get used to it.    Current medicines are reviewed at length with the patient today.  The patient concerns regarding his medicines were addressed.  The following changes have been made:  as above  Labs/ tests ordered today include: BMet in a few weeks No orders of the defined types were placed in this encounter.   Recommend 150 minutes/week of aerobic exercise Low fat, low carb, high fiber diet recommended  Disposition:   FU in 6 months   Signed, Larae Grooms, MD  07/27/2022 [facility] [place], [place], [place]   [address] Phone: [phone]; Fax: [phone]

## 2022-07-26 NOTE — Progress Notes (Signed)
    Chronic Care Management Pharmacy Assistant   Name: Quantay Zaremba  MRN: [id number] DOB: [birth year]  Reason for Encounter: Medication Review/Medication Coordination Call.   Recent office visits:  07/22/2022 Dr. Gena Fray MD (PCP) No medication Changes noted, Ambulatory referral to Cardiology , Ambulatory referral to Allergy, return in 3 months  Recent consult visits:  None ID  Hospital visits:  None in previous 6 months  Medications: Outpatient Encounter Medications as of 07/26/2022  Medication Sig   Ascorbic Acid (VITAMIN C) 1000 MG tablet Take 1,000 mg by mouth 2 (two) times a week.   aspirin EC 81 MG tablet Take 1 tablet (81 mg total) by mouth daily. Swallow whole.   cetirizine (ZYRTEC) 10 MG tablet Take 1 tablet (10 mg total) by mouth daily.   fluticasone (FLONASE) 50 MCG/ACT nasal spray Place 1 spray into both nostrils 2 (two) times daily.   latanoprost (XALATAN) 0.005 % ophthalmic solution Place 1 drop into both eyes at bedtime.   MAGNESIUM PO Take 400 mg by mouth at bedtime.   metoprolol succinate (TOPROL XL) 25 MG 24 hr tablet Take 1 tablet (25 mg total) by mouth daily.   Multiple Vitamin (MULTIVITAMIN WITH MINERALS) TABS tablet Take 1 tablet by mouth daily.   nitroGLYCERIN (NITROSTAT) 0.4 MG SL tablet Place 1 tablet (0.4 mg total) under the tongue every 5 (five) minutes as needed for chest pain.   pantoprazole (PROTONIX) 40 MG tablet Take 1 tablet (40 mg total) by mouth daily.   Polyvinyl Alcohol-Povidone (REFRESH OP) Place 1 drop into the left eye 2 (two) times daily as needed (dryness).   psyllium (METAMUCIL) 58.6 % powder Take 1 packet by mouth daily.   rosuvastatin (CRESTOR) 20 MG tablet Take 1 tablet (20 mg total) by mouth daily.   sacubitril-valsartan (ENTRESTO) 24-26 MG Take 1 tablet by mouth 2 (two) times daily.   tadalafil (CIALIS) 20 MG tablet Take 1 tablet (20 mg total) by mouth daily as needed for erectile dysfunction.   tamsulosin (FLOMAX) 0.4 MG CAPS capsule TAKE 1  CAPSULE BY MOUTH  DAILY   No facility-administered encounter medications on file as of 07/26/2022.   Care Gaps: Shingrix Vaccine Influenza Vaccine     Star Rating Drugs: Entresto 24-26 mg last filled 07/18/2022 30 day supply at [facility].  Rosuvastatin 20 mg last filled 07/05/2022 90 day supply at [facility].   Medication Fill Gaps: None ID  I received a task from Junius Argyle, CPP requesting that I start the application for patient assistance on the medication Entresto . Looking at patient chart, On 06/22/2022 looks like patient Cardiology started the process of patient assistance application.   Spoke with patient to confirm if he stills need help with completing patient assistance for [facility].Patient states he is unable to complete patient assistance application because his combine income with his wife  is to high. Patient states he spoke with his PCP last week,  whom inform him he is going to stop Entresto, and have him take just one of them since it is a compound drug. Patient states he is waiting to hear back from his PCP regarding that, and he no longer needs help with [facility] as he is no longer taking it. Notified Clinical pharmacist.   Anderson Malta Clinical Pharmacist Assistant [phone]

## 2022-07-27 ENCOUNTER — Encounter: Payer: Self-pay | Admitting: Interventional Cardiology

## 2022-07-27 ENCOUNTER — Ambulatory Visit: Payer: Medicare Other | Attending: Interventional Cardiology | Admitting: Interventional Cardiology

## 2022-07-27 VITALS — BP 122/78 | HR 68 | Ht 65.0 in | Wt 168.0 lb

## 2022-07-27 DIAGNOSIS — E785 Hyperlipidemia, unspecified: Secondary | ICD-10-CM

## 2022-07-27 DIAGNOSIS — I10 Essential (primary) hypertension: Secondary | ICD-10-CM

## 2022-07-27 DIAGNOSIS — I5020 Unspecified systolic (congestive) heart failure: Secondary | ICD-10-CM

## 2022-07-27 DIAGNOSIS — G4733 Obstructive sleep apnea (adult) (pediatric): Secondary | ICD-10-CM

## 2022-07-27 DIAGNOSIS — I493 Ventricular premature depolarization: Secondary | ICD-10-CM | POA: Diagnosis not present

## 2022-07-27 DIAGNOSIS — I2510 Atherosclerotic heart disease of native coronary artery without angina pectoris: Secondary | ICD-10-CM

## 2022-07-27 MED ORDER — VALSARTAN 320 MG PO TABS: 320.0000 mg | ORAL_TABLET | Freq: Every day | ORAL | 3 refills | Status: DC

## 2022-07-27 NOTE — Patient Instructions (Signed)
Medication Instructions:  Your physician has recommended you make the following change in your medication:  Stop Entresto after you finish currently supply.  When Entresto finished start Valsartan 320 mg by mouth daily  *If you need a refill on your cardiac medications before your next appointment, please call your pharmacy*   Lab Work: Your physician recommends that you return for lab work on August 10, 2022.  BMP.  This is not fasting.  The lab opens at 7:15 AM  If you have labs (blood work) drawn today and your tests are completely normal, you will receive your results only by: [place] (if you have MyChart) OR A paper copy in the mail If you have any lab test that is abnormal or we need to change your treatment, we will call you to review the results.   Testing/Procedures: none   Follow-Up: At [facility], you and your health needs are our priority.  As part of our continuing mission to provide you with exceptional heart care, we have created designated Provider Care Teams.  These Care Teams include your primary Cardiologist (physician) and Advanced Practice Providers (APPs -  Physician Assistants and Nurse Practitioners) who all work together to provide you with the care you need, when you need it.  We recommend signing up for the patient portal called "MyChart".  Sign up information is provided on this After Visit Summary.  MyChart is used to connect with patients for Virtual Visits (Telemedicine).  Patients are able to view lab/test results, encounter notes, upcoming appointments, etc.  Non-urgent messages can be sent to your provider as well.   To learn more about what you can do with MyChart, go to [email or web address].    Your next appointment:  Jan 27, 2023 at 9:00   The format for your next appointment:   In Person  Provider:   Larae Grooms, MD     Other Instructions    Important Information About Sugar

## 2022-08-03 NOTE — Progress Notes (Unsigned)
New Patient Note  RE: David Goodman MRN: [id number] DOB: 10-Jun-1941 Date of Office Visit: 08/04/2022  Consult requested by: Haydee Salter, MD Primary care provider: Haydee Salter, MD  Chief Complaint: No chief complaint on file.  History of Present Illness: I had the pleasure of seeing David Goodman for initial evaluation at the [facility] on 08/03/2022. He is a 81 y.o. male, who is referred here by Haydee Salter, MD for the evaluation of allergic rhinitis.  He reports symptoms of ***. Symptoms have been going on for *** years. The symptoms are present *** all year around with worsening in ***. Other triggers include exposure to ***. Anosmia: ***. Headache: ***. He has used *** with ***fair improvement in symptoms. Sinus infections: ***. Previous work up includes: ***. Previous ENT evaluation: ***. Previous sinus imaging: ***. History of nasal polyps: ***. Last eye exam: ***. History of reflux: ***.  07/22/2022 PCP visit: "David Goodman has a history of allergic rhinitis. he is manage don Flonase and Zyrtec. Despite this, he notes he has some persistent rhinorrhea and itchy/watery eyes. he also does complain of a dry cough."  Assessment and Plan: David Goodman is a 81 y.o. male with: No problem-specific Assessment & Plan notes found for this encounter.  No follow-ups on file.  No orders of the defined types were placed in this encounter.  Lab Orders  No laboratory test(s) ordered today    Other allergy screening: Asthma: {Blank single:19197::"yes","no"} Rhino conjunctivitis: {Blank single:19197::"yes","no"} Food allergy: {Blank single:19197::"yes","no"} Medication allergy: {Blank single:19197::"yes","no"} Hymenoptera allergy: {Blank single:19197::"yes","no"} Urticaria: {Blank single:19197::"yes","no"} Eczema:{Blank single:19197::"yes","no"} History of recurrent infections suggestive of immunodeficency: {Blank  single:19197::"yes","no"}  Diagnostics: Spirometry:  Tracings reviewed. His effort: {Blank single:19197::"Good reproducible efforts.","It was hard to get consistent efforts and there is a question as to whether this reflects a maximal maneuver.","Poor effort, data can not be interpreted."} FVC: ***L FEV1: ***L, ***% predicted FEV1/FVC ratio: ***% Interpretation: {Blank single:19197::"Spirometry consistent with mild obstructive disease","Spirometry consistent with moderate obstructive disease","Spirometry consistent with severe obstructive disease","Spirometry consistent with possible restrictive disease","Spirometry consistent with mixed obstructive and restrictive disease","Spirometry uninterpretable due to technique","Spirometry consistent with normal pattern","No overt abnormalities noted given today's efforts"}.  Please see scanned spirometry results for details.  Skin Testing: {Blank single:19197::"Select foods","Environmental allergy panel","Environmental allergy panel and select foods","Food allergy panel","None","Deferred due to recent antihistamines use"}. *** Results discussed with patient/family.   Past Medical History: Patient Active Problem List   Diagnosis Date Noted  . Iron deficiency anemia due to chronic blood loss 07/22/2022  . Personal history of colonic polyps 07/22/2022  . Gastro-esophageal reflux disease with esophagitis 07/22/2022  . Gastric polyp 07/22/2022  . 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 GIB (gastrointestinal bleeding) 04/11/2022  . 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 Vivid dream 09/21/2021  . Excessive daytime sleepiness 09/21/2021  . Snoring 09/21/2021  . Coronary artery disease involving native coronary artery of native heart 09/21/2021  . SOBOE (shortness of breath on exertion) 09/21/2021  . Coronary artery disease   . HFrEF (heart failure with reduced ejection fraction) ([place])   . Bell's palsy, left 06/05/2021  . Cerebrovascular  disease 06/05/2021  . Hiatal hernia 05/29/2021  . Glaucoma 02/27/2021  . 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 Erectile dysfunction due to arterial insufficiency 02/27/2021  . 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 Hemorrhoids 07/17/2020  . RLS (restless legs syndrome) 10/17/2019  . Syncope 11/15/2018  . Diverticulitis 05/25/2018  .  Osteoarthritis of right knee 12/13/2017  . Osteoarthritis of left knee 12/13/2017  . 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 BPH (benign prostatic hyperplasia) 06/18/2013  . HLD (hyperlipidemia) 07/13/2007  . Essential hypertension 07/13/2007  . Allergic rhinitis 07/13/2007  . Peptic ulcer disease 07/13/2007  . History of nephrolithiasis 07/13/2007   Past Medical History:  Diagnosis Date  . ALLERGIC RHINITIS 07/13/2007  . Aorta dilated on Echo; Normal sized Aorta on CT    Chest CT 1/23: No thoracic aortic aneurysm; coronary calcifications; aortic atherosclerosis; improved aeration of lungs with persistent minimal reticular opacities  . CAD (coronary artery disease)   . GERD (gastroesophageal reflux disease)   . GI bleed   . 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) ([place])   . HYPERLIPIDEMIA 07/13/2007  . HYPERTENSION 07/13/2007  . Impaired glucose tolerance 09/27/2016  . Iron deficiency anemia due to chronic blood loss 02/27/2021  . 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 NSVT (nonsustained ventricular tachycardia) ([place])   . PEPTIC ULCER DISEASE 07/13/2007  . PVC's (premature ventricular contractions)   . SVT (supraventricular tachycardia) ([place])   . 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 BROW LIFT Bilateral 11/10/2020   Procedure: BLEPHAROPLASTY;  Surgeon: [name], MD;  Location: [facility];  Service: Plastics;  Laterality: Bilateral;   1 hour  . COLONOSCOPY N/A 04/12/2022   Procedure: COLONOSCOPY;  Surgeon: Arta Silence, MD;  Location: [facility];  Service: Gastroenterology;  Laterality: N/A;  . COLONOSCOPY WITH PROPOFOL N/A 02/17/2019   Procedure: COLONOSCOPY WITH PROPOFOL;  Surgeon: Ronnette Juniper, MD;  Location: [facility];  Service: Gastroenterology;  Laterality: N/A;  . CORONARY STENT INTERVENTION N/A 08/25/2021   Procedure: CORONARY STENT INTERVENTION;  Surgeon: Jettie Booze, MD;  Location: [facility];  Service: Cardiovascular;  Laterality: N/A;  . HYDROCELE EXCISION Right 05/15/2003  . INTRAVASCULAR LITHOTRIPSY  08/25/2021   Procedure: INTRAVASCULAR LITHOTRIPSY;  Surgeon: Jettie Booze, MD;  Location: [facility];  Service: Cardiovascular;;  . INTRAVASCULAR ULTRASOUND/IVUS N/A 08/25/2021   Procedure: Intravascular Ultrasound/IVUS;  Surgeon: Jettie Booze, MD;  Location: [facility];  Service: Cardiovascular;  Laterality: N/A;  . IR ANGIOGRAM VISCERAL SELECTIVE  02/16/2019  . IR ANGIOGRAM VISCERAL SELECTIVE  02/16/2019  . IR ANGIOGRAM VISCERAL SELECTIVE  02/16/2019  . IR US GUIDE VASC ACCESS RIGHT  02/16/2019  . LEFT HEART CATH AND CORONARY ANGIOGRAPHY N/A 07/28/2021   Procedure: LEFT HEART CATH AND CORONARY ANGIOGRAPHY;  Surgeon: Jettie Booze, MD;  Location: [facility];  Service: Cardiovascular;  Laterality: N/A;  . LEFT HEART CATH AND CORONARY ANGIOGRAPHY N/A 08/25/2021   Procedure: LEFT HEART CATH AND CORONARY ANGIOGRAPHY;  Surgeon: Jettie Booze, MD;  Location: [facility];  Service: Cardiovascular;  Laterality: N/A;  . ROTATOR CUFF REPAIR    . SPERMATOCELECTOMY Right 05/15/2003   Medication List:  Current Outpatient Medications  Medication Sig Dispense Refill  . Ascorbic Acid (VITAMIN C) 1000 MG tablet Take 1,000 mg by mouth 2 (two) times a week.    Marland Kitchen aspirin EC 81 MG tablet Take 1 tablet (81 mg total) by mouth daily. Swallow whole. 90 tablet 3   . cetirizine (ZYRTEC) 10 MG tablet Take 1 tablet (10 mg total) by mouth daily. 30 tablet 11  . fluticasone (FLONASE) 50 MCG/ACT nasal spray Place 1 spray into both nostrils 2 (two) times daily.  15.8 mL 6  . latanoprost (XALATAN) 0.005 % ophthalmic solution Place 1 drop into both eyes at bedtime. 7.5 mL 1  . MAGNESIUM PO Take 400 mg by mouth at bedtime.    . metoprolol succinate (TOPROL XL) 25 MG 24 hr tablet Take 1 tablet (25 mg total) by mouth daily. 90 tablet 3  . Multiple Vitamin (MULTIVITAMIN WITH MINERALS) TABS tablet Take 1 tablet by mouth daily.    . nitroGLYCERIN (NITROSTAT) 0.4 MG SL tablet Place 1 tablet (0.4 mg total) under the tongue every 5 (five) minutes as needed for chest pain. 25 tablet 3  . pantoprazole (PROTONIX) 40 MG tablet Take 1 tablet (40 mg total) by mouth daily. 30 tablet 11  . Polyvinyl Alcohol-Povidone (REFRESH OP) Place 1 drop into the left eye 2 (two) times daily as needed (dryness).    . psyllium (METAMUCIL) 58.6 % powder Take 1 packet by mouth daily.    . rosuvastatin (CRESTOR) 20 MG tablet Take 1 tablet (20 mg total) by mouth daily. 30 tablet 1  . tadalafil (CIALIS) 20 MG tablet Take 1 tablet (20 mg total) by mouth daily as needed for erectile dysfunction. 30 tablet 2  . tamsulosin (FLOMAX) 0.4 MG CAPS capsule TAKE 1 CAPSULE BY MOUTH  DAILY 90 capsule 3  . valsartan (DIOVAN) 320 MG tablet Take 1 tablet (320 mg total) by mouth daily. 90 tablet 3   No current facility-administered medications for this visit.   Allergies: No Known Allergies Social History: Social History   Socioeconomic History  . Marital status: Married    Spouse name: Not on file  . Number of children: Not on file  . Years of education: Not on file  . Highest education level: Not on file  Occupational History  . Not on file  Tobacco Use  . Smoking status: Never  . Smokeless tobacco: Never  Vaping Use  . Vaping Use: Never used  Substance and Sexual Activity  . Alcohol use: Yes     Comment: couple times a week - wine  . Drug use: No  . Sexual activity: Not Currently  Other Topics Concern  . Not on file  Social History Narrative   Lives alone   Right handed   Caffeine: 1 cup of coffee a day   Social Determinants of Health   Financial Resource Strain: Low Risk  (12/18/2021)   Overall Financial Resource Strain (CARDIA)   . Difficulty of Paying Living Expenses: Not hard at all  Food Insecurity: Not on file  Transportation Needs: No Transportation Needs (01/15/2021)   [place] - Transportation   . Lack of Transportation (Medical): No   . Lack of Transportation (Non-Medical): No  Physical Activity: Not on file  Stress: Not on file  Social Connections: Not on file   Lives in a ***. Smoking: *** Occupation: ***  Environmental HistoryFreight forwarder in the house: Estate agent in the family room: {Blank single:19197::"yes","no"} Carpet in the bedroom: {Blank single:19197::"yes","no"} Heating: {Blank single:19197::"electric","gas","heat pump"} Cooling: {Blank single:19197::"central","window","heat pump"} Pet: {Blank single:19197::"yes ***","no"}  Family History: Family History  Problem Relation Age of Onset  . Cancer Brother    Problem                               Relation Asthma                                   ***  Eczema                                *** Food allergy                          *** Allergic rhino conjunctivitis     ***  Review of Systems  Constitutional:  Negative for appetite change, chills, fever and unexpected weight change.  HENT:  Negative for congestion and rhinorrhea.   Eyes:  Negative for itching.  Respiratory:  Negative for cough, chest tightness, shortness of breath and wheezing.   Cardiovascular:  Negative for chest pain.  Gastrointestinal:  Negative for abdominal pain.  Genitourinary:  Negative for difficulty urinating.  Skin:  Negative for rash.  Neurological:  Negative for headaches.    Objective: There were no vitals taken for this visit. There is no height or weight on file to calculate BMI. Physical Exam Vitals and nursing note reviewed.  Constitutional:      Appearance: Normal appearance. He is well-developed.  HENT:     Head: Normocephalic and atraumatic.     Right Ear: Tympanic membrane and external ear normal.     Left Ear: Tympanic membrane and external ear normal.     Nose: Nose normal.     Mouth/Throat:     Mouth: Mucous membranes are moist.     Pharynx: Oropharynx is clear.  Eyes:     Conjunctiva/sclera: Conjunctivae normal.  Cardiovascular:     Rate and Rhythm: Normal rate and regular rhythm.     Heart sounds: Normal heart sounds. No murmur heard.    No friction rub. No gallop.  Pulmonary:     Effort: Pulmonary effort is normal.     Breath sounds: Normal breath sounds. No wheezing, rhonchi or rales.  Musculoskeletal:     Cervical back: Neck supple.  Skin:    General: Skin is warm.     Findings: No rash.  Neurological:     Mental Status: He is alert and oriented to person, place, and time.  Psychiatric:        Behavior: Behavior normal.  The plan was reviewed with the patient/family, and all questions/concerned were addressed.  It was my pleasure to see David Goodman today and participate in his care. Please feel free to contact me with any questions or concerns.  Sincerely,  Rexene Alberts, DO Allergy & Immunology  [facility] office: [place] office: [phone]

## 2022-08-04 ENCOUNTER — Other Ambulatory Visit: Payer: Self-pay

## 2022-08-04 ENCOUNTER — Ambulatory Visit: Payer: Medicare Other | Admitting: Allergy

## 2022-08-04 ENCOUNTER — Encounter: Payer: Self-pay | Admitting: Allergy

## 2022-08-04 VITALS — BP 130/80 | HR 51 | Temp 97.3°F | Resp 18 | Ht 65.0 in | Wt 169.4 lb

## 2022-08-04 DIAGNOSIS — J310 Chronic rhinitis: Secondary | ICD-10-CM | POA: Diagnosis not present

## 2022-08-04 MED ORDER — IPRATROPIUM BROMIDE 0.03 % NA SOLN: 1.0000 | Freq: Two times a day (BID) | NASAL | 5 refills | Status: DC | PRN

## 2022-08-04 NOTE — Assessment & Plan Note (Addendum)
Main complaint of perennial rhinorrhea worsening in the past 5-6 months. Tried zyrtec and Flonase with no benefit. 2016 skin testing positive to mold and cockroach. No prior AIT. Saw ENT for dizziness in the past. On PPI for GERD.  Unable to skin test today due to insurance not covering new patient visits and skin testing on the same day.   Return for skin testing in 1 month (adult environmental panel). No antihistamines for 3 days before.   Use Atrovent (ipratropium) 0.03% 1-2 sprays per nostril twice a day as needed for runny nose/drainage. Demonstrated proper use.

## 2022-08-04 NOTE — Patient Instructions (Signed)
Rhinitis Unable to skin test today as your insurance won't allow it.  Return for skin testing in 1 month. No antihistamines for 3 days before.   Use Atrovent (ipratropium) 0.03% 1-2 sprays per nostril twice a day as needed for runny nose/drainage.  Follow up in 1 month or sooner if needed.  Our [place] office is moving in September 2023 to a new location. New address: [address] [place], [place], [place] [address] (white building). [place] office: [phone] (same phone number).

## 2022-08-10 ENCOUNTER — Ambulatory Visit: Payer: Medicare Other | Attending: Interventional Cardiology

## 2022-08-10 DIAGNOSIS — I10 Essential (primary) hypertension: Secondary | ICD-10-CM

## 2022-08-10 DIAGNOSIS — I5020 Unspecified systolic (congestive) heart failure: Secondary | ICD-10-CM

## 2022-08-11 LAB — BASIC METABOLIC PANEL
BUN/Creatinine Ratio: 22 (ref 10–24)
BUN: 21 mg/dL (ref 8–27)
CO2: 23 mmol/L (ref 20–29)
Calcium: 9.3 mg/dL (ref 8.6–10.2)
Chloride: 99 mmol/L (ref 96–106)
Creatinine, Ser: 0.97 mg/dL (ref 0.76–1.27)
Glucose: 89 mg/dL (ref 70–99)
Potassium: 4.4 mmol/L (ref 3.5–5.2)
Sodium: 138 mmol/L (ref 134–144)
eGFR: 79 mL/min/{1.73_m2} (ref >59)

## 2022-08-15 DIAGNOSIS — G4733 Obstructive sleep apnea (adult) (pediatric): Secondary | ICD-10-CM | POA: Diagnosis not present

## 2022-08-26 ENCOUNTER — Ambulatory Visit: Payer: Medicare Other | Admitting: Neurology

## 2022-09-01 ENCOUNTER — Ambulatory Visit: Payer: Medicare Other | Admitting: Allergy

## 2022-09-03 ENCOUNTER — Ambulatory Visit: Payer: Medicare Other | Admitting: Allergy

## 2022-09-03 ENCOUNTER — Ambulatory Visit (INDEPENDENT_AMBULATORY_CARE_PROVIDER_SITE_OTHER): Payer: Medicare Other | Admitting: Nurse Practitioner

## 2022-09-03 ENCOUNTER — Encounter: Payer: Self-pay | Admitting: Nurse Practitioner

## 2022-09-03 VITALS — BP 130/66 | HR 60 | Temp 97.1°F | Wt 172.0 lb

## 2022-09-03 DIAGNOSIS — M545 Low back pain, unspecified: Secondary | ICD-10-CM | POA: Diagnosis not present

## 2022-09-03 DIAGNOSIS — Z23 Encounter for immunization: Secondary | ICD-10-CM

## 2022-09-03 MED ORDER — TRAMADOL HCL 50 MG PO TABS: 50.0000 mg | ORAL_TABLET | Freq: Two times a day (BID) | ORAL | 0 refills | Status: DC | PRN

## 2022-09-03 MED ORDER — TIZANIDINE HCL 4 MG PO TABS: 4.0000 mg | ORAL_TABLET | Freq: Four times a day (QID) | ORAL | 0 refills | Status: DC | PRN

## 2022-09-03 NOTE — Progress Notes (Signed)
Acute Office Visit  Subjective:     [name], male    DOB: [birth year], 81 y.o.   MRN: [id number]  Chief Complaint  Patient presents with   Back Pain    Pt c/o lower back x3-4 days. Pt states his pain is at level 9, pain is worse whe he tries to stand up.     HPI Patient is in today for back pain for the last few days. He denies any recent injury.   BACK PAIN  Duration: days Mechanism of injury: no trauma Location: Right and low back Onset: sudden Severity: 9/10 Quality: sharp Frequency: intermittent Radiation: none Aggravating factors: movement Alleviating factors: rest, ice, and laying Status: fluctuating Treatments attempted: rest, ice, and APAP  Relief with NSAIDs?: No NSAIDs Taken Nighttime pain:  no Paresthesias / decreased sensation:  no Bowel / bladder incontinence:  no Fevers:  no Dysuria / urinary frequency:  no  ROS See pertinent positives and negatives per HPI.    Objective:    BP 130/66   Pulse 60   Temp (!) 97.1 F (36.2 C) (Temporal)   Wt 172 lb (78 kg)   SpO2 92%   BMI 28.62 kg/m    Physical Exam Vitals and nursing note reviewed.  Constitutional:      Appearance: Normal appearance.  HENT:     Head: Normocephalic.  Eyes:     Conjunctiva/sclera: Conjunctivae normal.  Cardiovascular:     Rate and Rhythm: Normal rate and regular rhythm.     Pulses: Normal pulses.     Heart sounds: Normal heart sounds.  Pulmonary:     Effort: Pulmonary effort is normal.     Breath sounds: Normal breath sounds.  Musculoskeletal:        General: No swelling, tenderness or deformity.     Cervical back: Normal range of motion.     Comments: Straight leg raise negative bilaterally   Skin:    General: Skin is warm.  Neurological:     General: No focal deficit present.     Mental Status: He is alert and oriented to person, place, and time.  Psychiatric:        Mood and Affect: Mood normal.        Behavior: Behavior normal.         Thought Content: Thought content normal.        Judgment: Judgment normal.      Assessment & Plan:   Problem List Items Addressed This Visit       Other   Acute right-sided low back pain without sciatica - Primary    Acute, right sided-low back pain for the last few days. He denies any recent injury. No tenderness with palpation, sitting, or laying. Standing and taking the first few steps makes the pain worse. He is unable to take NSAIDs due to recent GI bleed. Will have him start stretches daily, tizanidine as needed for muscle tightness/spasms, and tramadol as needed for pain. PDMP reviewed. Discussed not driving after taking tramadol. Follow-up if symptoms worsen or don't improve.       Relevant Medications   traMADol (ULTRAM) 50 MG tablet   tiZANidine (ZANAFLEX) 4 MG tablet   Other Visit Diagnoses     Need for influenza vaccination       Flu vaccine given today   Relevant Orders   Flu Vaccine QUAD High Dose(Fluad) (Completed)       Meds ordered this encounter  Medications   traMADol (  ULTRAM) 50 MG tablet    Sig: Take 1 tablet (50 mg total) by mouth every 12 (twelve) hours as needed for up to 5 days.    Dispense:  10 tablet    Refill:  0   tiZANidine (ZANAFLEX) 4 MG tablet    Sig: Take 1 tablet (4 mg total) by mouth every 6 (six) hours as needed for muscle spasms.    Dispense:  30 tablet    Refill:  0    Return if symptoms worsen or fail to improve.  Charyl Dancer, NP

## 2022-09-03 NOTE — Assessment & Plan Note (Signed)
Acute, right sided-low back pain for the last few days. He denies any recent injury. No tenderness with palpation, sitting, or laying. Standing and taking the first few steps makes the pain worse. He is unable to take NSAIDs due to recent GI bleed. Will have him start stretches daily, tizanidine as needed for muscle tightness/spasms, and tramadol as needed for pain. PDMP reviewed. Discussed not driving after taking tramadol. Follow-up if symptoms worsen or don't improve.

## 2022-09-03 NOTE — Patient Instructions (Signed)
It was great to see you!  Start the back exercises daily. Continue using ice. You can take tramadol as needed for pain and tizanidine as needed for muscle tightness.   Let's follow-up if your symptoms worsen or don't improve.   Take care,  Vance Peper, NP

## 2022-09-05 ENCOUNTER — Emergency Department (HOSPITAL_COMMUNITY): Payer: Medicare Other

## 2022-09-05 ENCOUNTER — Other Ambulatory Visit: Payer: Self-pay

## 2022-09-05 ENCOUNTER — Emergency Department (HOSPITAL_COMMUNITY)
Admission: EM | Admit: 2022-09-05 | Discharge: 2022-09-05 | Disposition: A | Discharge status: Home / Self Care | Payer: Medicare Other | Attending: Emergency Medicine | Admitting: Emergency Medicine

## 2022-09-05 ENCOUNTER — Encounter (HOSPITAL_COMMUNITY): Payer: Self-pay

## 2022-09-05 DIAGNOSIS — I110 Hypertensive heart disease with heart failure: Secondary | ICD-10-CM | POA: Insufficient documentation

## 2022-09-05 DIAGNOSIS — R079 Chest pain, unspecified: Secondary | ICD-10-CM | POA: Diagnosis not present

## 2022-09-05 DIAGNOSIS — Z955 Presence of coronary angioplasty implant and graft: Secondary | ICD-10-CM | POA: Diagnosis not present

## 2022-09-05 DIAGNOSIS — Z79899 Other long term (current) drug therapy: Secondary | ICD-10-CM | POA: Diagnosis not present

## 2022-09-05 DIAGNOSIS — I2089 Other forms of angina pectoris: Secondary | ICD-10-CM | POA: Insufficient documentation

## 2022-09-05 DIAGNOSIS — I5030 Unspecified diastolic (congestive) heart failure: Secondary | ICD-10-CM | POA: Diagnosis not present

## 2022-09-05 DIAGNOSIS — Z20822 Contact with and (suspected) exposure to covid-19: Secondary | ICD-10-CM | POA: Insufficient documentation

## 2022-09-05 DIAGNOSIS — Z7982 Long term (current) use of aspirin: Secondary | ICD-10-CM | POA: Diagnosis not present

## 2022-09-05 DIAGNOSIS — I2510 Atherosclerotic heart disease of native coronary artery without angina pectoris: Secondary | ICD-10-CM | POA: Insufficient documentation

## 2022-09-05 DIAGNOSIS — I209 Angina pectoris, unspecified: Secondary | ICD-10-CM | POA: Diagnosis not present

## 2022-09-05 LAB — CBC WITH DIFFERENTIAL/PLATELET
Abs Immature Granulocytes: 0.01 10*3/uL (ref 0.00–0.07)
Basophils Absolute: 0 10*3/uL (ref 0.0–0.1)
Basophils Relative: 1 %
Eosinophils Absolute: 0.2 10*3/uL (ref 0.0–0.5)
Eosinophils Relative: 3 %
HCT: 38.2 % — ABNORMAL LOW (ref 39.0–52.0)
Hemoglobin: 12.2 g/dL — ABNORMAL LOW (ref 13.0–17.0)
Immature Granulocytes: 0 %
Lymphocytes Relative: 21 %
Lymphs Abs: 1.4 10*3/uL (ref 0.7–4.0)
MCH: 25.8 pg — ABNORMAL LOW (ref 26.0–34.0)
MCHC: 31.9 g/dL (ref 30.0–36.0)
MCV: 80.8 fL (ref 80.0–100.0)
Monocytes Absolute: 0.5 10*3/uL (ref 0.1–1.0)
Monocytes Relative: 7 %
Neutro Abs: 4.4 10*3/uL (ref 1.7–7.7)
Neutrophils Relative %: 68 %
Platelets: 250 10*3/uL (ref 150–400)
RBC: 4.73 MIL/uL (ref 4.22–5.81)
RDW: 16.2 % — ABNORMAL HIGH (ref 11.5–15.5)
WBC: 6.4 10*3/uL (ref 4.0–10.5)
nRBC: 0 % (ref 0.0–0.2)

## 2022-09-05 LAB — RESP PANEL BY RT-PCR (FLU A&B, COVID) ARPGX2
Influenza A by PCR: NEGATIVE
Influenza B by PCR: NEGATIVE
SARS Coronavirus 2 by RT PCR: NEGATIVE

## 2022-09-05 LAB — TROPONIN I (HIGH SENSITIVITY)
Troponin I (High Sensitivity): 12 ng/L (ref <18)
Troponin I (High Sensitivity): 13 ng/L (ref <18)

## 2022-09-05 LAB — BASIC METABOLIC PANEL
Anion gap: 7 (ref 5–15)
BUN: 24 mg/dL — ABNORMAL HIGH (ref 8–23)
CO2: 31 mmol/L (ref 22–32)
Calcium: 8.9 mg/dL (ref 8.9–10.3)
Chloride: 102 mmol/L (ref 98–111)
Creatinine, Ser: 1.05 mg/dL (ref 0.61–1.24)
GFR, Estimated: >60 mL/min (ref >60)
Glucose, Bld: 96 mg/dL (ref 70–99)
Potassium: 4.5 mmol/L (ref 3.5–5.1)
Sodium: 140 mmol/L (ref 135–145)

## 2022-09-05 LAB — BRAIN NATRIURETIC PEPTIDE: B Natriuretic Peptide: 280.7 pg/mL — ABNORMAL HIGH (ref 0.0–100.0)

## 2022-09-05 MED ORDER — ASPIRIN 81 MG PO CHEW
324.0000 mg | CHEWABLE_TABLET | Freq: Once | ORAL | Status: AC
  Administered 2022-09-05: 324 mg via ORAL
  Filled 2022-09-05: qty 4

## 2022-09-05 NOTE — Discharge Instructions (Addendum)
It was a pleasure caring for you today in the emergency department.  Please return to the emergency department for any worsening or worrisome symptoms.  If you symptoms occur 2 more times please come back to the ER urgently, otherwise follow up with your cardiologist as outpatient

## 2022-09-05 NOTE — ED Provider Triage Note (Signed)
Emergency Medicine Provider Triage Evaluation Note  David Goodman , a 81 y.o. male  was evaluated in triage.  Pt complains of cp x few hours. Located to left chest. Associated SOB. Goes into left scapula. No LE swelling, PND, orthopnea. No hx of PE, DVT Review of Systems  Positive: CP, SOB Negative: Fever, cough, le swelling  Physical Exam  Wt 78 kg   BMI 28.62 kg/m  Gen:   Awake, no distress   Resp:  Normal effort  MSK:   Moves extremities without difficulty  Other:    Medical Decision Making  Medically screening exam initiated at 3:11 PM.  Appropriate orders placed.  David Goodman was informed that the remainder of the evaluation will be completed by another provider, this initial triage assessment does not replace that evaluation, and the importance of remaining in the ED until their evaluation is complete.  CP   Nerine Pulse A, PA-C 09/05/22 1512

## 2022-09-05 NOTE — ED Triage Notes (Signed)
Pt reports left sided chest discomfort with shob, dizziness, and lightheaded. Nitro x3 with little relief.

## 2022-09-05 NOTE — ED Provider Notes (Signed)
[facility] DEPT Provider Note   CSN: [id number] Arrival date & time: 09/05/22  1458     History  Chief Complaint  Patient presents with   Chest Pain    David Goodman is a 81 y.o. male.  Patient as above with significant medical history as below, including HLD, HTN, NSVT, TMJ, CAD (follows w/ Dr Irish [facility]) who presents to the ED with complaint of chest pain.  Patient reports around 2 PM today he was lying in his bed, began to have a burning, pressure, heaviness sensation to his left side chest wall.  Nonradiating.  No nausea or vomiting, no diaphoresis.  Felt mildly short of breath when the symptoms began.  He took 3 nitroglycerin sublingual tablets which did resolve his discomfort.  Total duration of symptoms was less than 5 minutes.  Symptoms have not returned.  Symptoms were not worsened with exertion.  He has not experienced the symptoms in the past, this is the first time he has had to take his nitroglycerin since it was prescribed per the patient.  No ongoing symptoms.  He feels asymptomatic at this time.  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) ([place])    HYPERLIPIDEMIA 07/13/2007   HYPERTENSION 07/13/2007   Impaired glucose tolerance 09/27/2016   Iron deficiency anemia due to chronic blood loss 02/27/2021   NEPHROLITHIASIS, 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   NSVT (nonsustained ventricular tachycardia) ([place])    PEPTIC ULCER DISEASE 07/13/2007   PVC's (premature ventricular contractions)    SVT (supraventricular tachycardia)     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    Past Surgical History:  Procedure Laterality Date   BROW LIFT Bilateral 11/10/2020   Procedure: BLEPHAROPLASTY;  Surgeon: [name], MD;  Location: [facility];  Service: Plastics;  Laterality: Bilateral;  1 hour   COLONOSCOPY N/A 04/12/2022   Procedure: COLONOSCOPY;  Surgeon: Arta Silence, MD;  Location: [facility];  Service: Gastroenterology;  Laterality: N/A;   COLONOSCOPY WITH PROPOFOL N/A 02/17/2019   Procedure: COLONOSCOPY WITH PROPOFOL;  Surgeon: Ronnette Juniper, MD;  Location: [facility];  Service: Gastroenterology;  Laterality: N/A;   CORONARY STENT INTERVENTION N/A 08/25/2021   Procedure: CORONARY STENT INTERVENTION;  Surgeon: Jettie Booze, MD;  Location: [place];  Service: Cardiovascular;  Laterality: N/A;   HYDROCELE EXCISION Right 05/15/2003   INTRAVASCULAR LITHOTRIPSY  08/25/2021   Procedure: INTRAVASCULAR LITHOTRIPSY;  Surgeon: Jettie Booze, MD;  Location: [place] [facility];  Service: Cardiovascular;;   INTRAVASCULAR ULTRASOUND/IVUS N/A 08/25/2021   Procedure: Intravascular Ultrasound/IVUS;  Surgeon: Jettie Booze, MD;  Location: [place] [facility];  Service: Cardiovascular;  Laterality: N/A;   IR ANGIOGRAM VISCERAL SELECTIVE  02/16/2019   IR ANGIOGRAM VISCERAL SELECTIVE  02/16/2019   IR ANGIOGRAM VISCERAL SELECTIVE  02/16/2019   IR US GUIDE VASC ACCESS RIGHT  02/16/2019   LEFT HEART CATH AND CORONARY ANGIOGRAPHY N/A 07/28/2021   Procedure: LEFT HEART CATH AND CORONARY ANGIOGRAPHY;  Surgeon: Jettie Booze, MD;  Location: [place] [facility];  Service: Cardiovascular;  Laterality: N/A;   LEFT HEART CATH AND CORONARY ANGIOGRAPHY N/A 08/25/2021   Procedure: LEFT HEART CATH AND CORONARY ANGIOGRAPHY;  Surgeon: Jettie Booze, MD;  Location: [place] [facility];  Service: Cardiovascular;  Laterality: N/A;   ROTATOR CUFF REPAIR     SPERMATOCELECTOMY Right 05/15/2003      The history is provided by the patient. No language interpreter was used.  Chest Pain      Home Medications Prior to Admission medications   Medication Sig Start Date End Date Taking? Authorizing Provider  Ascorbic Acid (VITAMIN C) 1000 MG tablet Take 1,000 mg by mouth 2 (two) times a week.    [provider]  aspirin EC 81 MG tablet Take 1 tablet (81 mg total) by mouth daily. Swallow whole. 04/28/22   Dunn, Nedra Hai, PA-C  fluticasone (FLONASE) 50 MCG/ACT nasal spray Place 1 spray into both nostrils 2 (two) times daily. 07/22/22   Haydee Salter, MD  ipratropium (ATROVENT) 0.03 % nasal spray Place 1-2 sprays into both nostrils 2 (two) times daily as needed (nasal drainage). 08/04/22   Garnet Sierras, DO  latanoprost (XALATAN) 0.005 % ophthalmic solution Place 1 drop into both eyes at bedtime. 06/20/17   Marletta Lor, MD  MAGNESIUM PO Take 400 mg by mouth at bedtime.    [provider]  metoprolol succinate (TOPROL XL) 25 MG 24 hr tablet Take 1 tablet (25 mg total) by mouth daily. 01/01/22 01/01/23  Richardson Dopp T, PA-C  Multiple Vitamin (MULTIVITAMIN WITH MINERALS) TABS tablet Take 1 tablet by mouth daily.    [provider]  nitroGLYCERIN (NITROSTAT) 0.4 MG SL tablet Place 1 tablet (0.4 mg total) under the tongue every 5 (five) minutes as needed for chest pain. 07/22/21   Jettie Booze, MD  pantoprazole (PROTONIX) 40 MG tablet Take 1 tablet (40 mg total) by mouth daily. 05/04/22   Jettie Booze, MD  Polyvinyl Alcohol-Povidone (REFRESH OP) Place 1 drop into the left eye 2 (two) times daily as needed (dryness).    [provider]  psyllium (METAMUCIL) 58.6 % powder Take 1 packet by mouth daily.    [provider]  rosuvastatin (CRESTOR) 20 MG tablet Take 1 tablet (20 mg total) by mouth daily. 07/05/22 07/05/23  Haydee Salter, MD  tadalafil (CIALIS) 20 MG tablet Take 1 tablet (20 mg total) by mouth daily as needed for erectile dysfunction.  03/06/20   Isaac Bliss, Rayford Halsted, MD  tamsulosin (FLOMAX) 0.4 MG CAPS capsule TAKE 1 CAPSULE BY MOUTH  DAILY 10/30/21   Isaac Bliss, Rayford Halsted, MD  valsartan (DIOVAN) 320 MG tablet Take 1 tablet (320 mg total) by mouth daily. 07/27/22   Jettie Booze, MD      Allergies    Patient has no known allergies.    Review of Systems   Review of Systems  Cardiovascular:  Positive for chest pain.    Physical Exam Updated Vital Signs BP (!) 166/113   Pulse 61   Temp 97.6 F (36.4 C)   Resp 11   Wt 78 kg   SpO2 94%   BMI 28.62 kg/m  Physical Exam  ED Results / Procedures / Treatments   Labs (all labs ordered are listed, but only abnormal results are displayed) Labs Reviewed  CBC WITH DIFFERENTIAL/PLATELET - Abnormal; Notable for the following components:      Result Value   Hemoglobin 12.2 (*)    HCT 38.2 (*)    [facility]  25.8 (*)    RDW 16.2 (*)    All other components within normal limits  BASIC METABOLIC PANEL - Abnormal; Notable for the following components:   BUN 24 (*)    All other components within normal limits  BRAIN NATRIURETIC PEPTIDE - Abnormal; Notable for the following components:   B Natriuretic Peptide 280.7 (*)    All other components within normal limits  RESP PANEL BY RT-PCR (FLU A&B, COVID) ARPGX2  TROPONIN I (HIGH SENSITIVITY)  TROPONIN I (HIGH SENSITIVITY)    EKG EKG Interpretation  Date/Time:  Sunday September 05 2022 15:13:09 EDT Ventricular Rate:  50 PR Interval:  164 QRS Duration: 107 QT Interval:  457 QTC Calculation: 417 R Axis:   -13 Text Interpretation: Sinus rhythm Probable anteroseptal infarct, old Baseline wander in lead(s) II similar to prior tracing no stemi Confirmed by Wynona Dove ([id number]) on 09/05/2022 4:31:10 PM  Radiology No results found.  Procedures Procedures    Medications Ordered in ED Medications  aspirin chewable tablet 324 mg (324 mg Oral Given 09/05/22 1707)    ED Course/ Medical Decision Making/ A&P Clinical  Course as of 09/10/22 [id number]  [facility] Sep 05, 2022  2022 D/w Dr Tereasa Coop, agree with plan for discharge.  Advised if the symptoms recur more than twice recommended come back to the emergency department for evaluation and possible admission, otherwise follow-up with cardiology in the next 2 weeks. [SG]    Clinical Course User Index [SG] Jeanell Sparrow, DO                           Medical Decision Making Risk OTC drugs.   This patient presents to the ED with chief complaint(s) of chest pain with pertinent past medical history of above including CAD, hypertension, hyperlipidemia which further complicates the presenting complaint. The complaint involves an extensive differential diagnosis and also carries with it a high risk of complications and morbidity.    Differential includes all life-threatening causes for chest pain. This includes but is not exclusive to acute coronary syndrome, aortic dissection, pulmonary embolism, cardiac tamponade, community-acquired pneumonia, pericarditis, musculoskeletal chest wall pain, etc.  . Serious etiologies were considered.   The initial plan is to screen labs, imaging, EKG   Additional history obtained: Additional history obtained from spouse Records reviewed Primary Care Documents and prior labs and imaging, home medications Echocardiogram 5/15 with EF 40 to 45%, G1 DD He takes daily aspirin, Toprol-XL 25 mg daily, Crestor 20 mg daily, valsartan 320 mg qd  Independent labs interpretation:  The following labs were independently interpreted:  Trop Negative x2, BMP stable, CBC stable.  BNP is 280 >> History of HFrEF, does not appear to be overtly fluid overloaded on exam.  No dyspnea  Independent visualization of imaging: - I independently visualized the following imaging with scope of interpretation limited to determining acute life threatening conditions related to emergency care: Chest x-ray, which revealed no acute process   Cardiac monitoring was  reviewed and interpreted by myself which shows normal sinus rhythm  Treatment and Reassessment: Aspirin >> Symptoms stayed the same, he is asymptomatic  Consultation: - Consulted or discussed management/test interpretation w/ external professional: cardiology >> Symptoms that occur at rest, is the first time patient is experiencing symptoms.  They did resolve with nitroglycerin x3.  Concern for possible unstable angina.  Will discuss with cardiology. Spoke with cards fellow as noted above, will plan for dc and close o/p f/u; ambulatory  referral to cardiology was placed   Consideration for admission or further workup: Admission was considered   Pt is asymptomatic, no re-occurrence of symptoms while in ED, labs stable, ekg/trop stable, cxr unremarkable. Will plan for discharge.   The patient improved significantly and was discharged in stable condition. Detailed discussions were had with the patient regarding current findings, and need for close f/u with PCP or on call doctor. The patient has been instructed to return immediately if the symptoms worsen in any way for re-evaluation. Patient verbalized understanding and is in agreement with current care plan. All questions answered prior to discharge.    Social Determinants of health: Social History   Tobacco Use   Smoking status: Never   Smokeless tobacco: Never  Vaping Use   Vaping Use: Never used  Substance Use Topics   Alcohol use: Yes    Comment: couple times a week - wine   Drug use: No            Final Clinical Impression(s) / ED Diagnoses Final diagnoses:  Angina at rest    Rx / DC Orders ED Discharge Orders          Ordered    Ambulatory referral to Cardiology        09/05/22 2039              Jeanell Sparrow, DO 09/10/22 [phone]

## 2022-09-06 ENCOUNTER — Telehealth: Payer: Self-pay

## 2022-09-06 NOTE — Telephone Encounter (Signed)
Transition Care Management Follow-up Telephone Call Date of discharge and from where: 09/05/22 [facility] ED How have you been since you were released from the hospital? I've been in pain. Any questions or concerns? No  Items Reviewed: Did the pt receive and understand the discharge instructions provided? Yes  Medications obtained and verified? No  Other? No  Any new allergies since your discharge? No  Dietary orders reviewed? No Do you have support at home? Yes   Home Care and Equipment/Supplies: Were home health services ordered? not applicable If so, what is the name of the agency? N/a  Has the agency set up a time to come to the patient's home? not applicable Were any new equipment or medical supplies ordered?  No What is the name of the medical supply agency? N/a Were you able to get the supplies/equipment? not applicable Do you have any questions related to the use of the equipment or supplies? No  Functional Questionnaire: (I = Independent and D = Dependent) ADLs: I  Bathing/Dressing- I  Meal Prep- I  Eating- I  Maintaining continence- I  Transferring/Ambulation- I  Managing Meds- I  Follow up appointments reviewed:  PCP Hospital f/u appt confirmed? Yes  Scheduled to see Dr. Gena Fray on 09/09/22 @ 10:00am. [facility] f/u appt confirmed? No  Scheduled to see n/a on n/a @ n/a. Are transportation arrangements needed? No  If their condition worsens, is the pt aware to call PCP or go to the Emergency Dept.? Yes Was the patient provided with contact information for the PCP's office or ED? Yes Was to pt encouraged to call back with questions or concerns? Yes  Angeline Slim, RN, BSN

## 2022-09-07 NOTE — Progress Notes (Unsigned)
[facility] [address] [place]. [place] [address] [phone]       OFFICE FOLLOW UP NOTE  [name] Date of Birth:  January 31, 1941 Medical Record Number:  [id number]   Reason for visit: Initial CPAP follow-up    SUBJECTIVE:   CHIEF COMPLAINT:  No chief complaint on file.   HPI:   Update 09/08/2022 JM: Patient returns for initial CPAP compliance visit.  Split-night study 10/2021 showed complex, mostly obstructive sleep apnea moderate severity at AHI of 25.7/h and proceeded with titration study which was completed 01/24/2022.  CPAP initiated 7/17.   Epworth Sleepiness Scale ***/24 (prior to CPAP 10/24) Fatigue severity scale ***/63 (prior to CPAP 19/63)        Consult visit 09/21/2021 Dr. Brett Fairy: David Goodman is a 81 y.o. patient seen here upon PCP  referral on 09/21/2021 .  Chief concern according to patient:  Just last month diagnosed wit CAD, reports vivid dreams, lots of them, and and even during naps. Has been EDS. Internal referral for non restorative sleep. Pt reports having vivid dreams and is waking up tired in the morning. Pt reports snoring,  being SOB, dx with CAD, is exercising to help his overall health.   He also dreams vividly until the very moment he wakes up and when he goes to the bathd room he can continue the same dream.    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     Sleep relevant medical history: Nocturia, vivid dreams, CAD, SOB, hearing aid.    Family medical /sleep history: no other family member on CPAP with OSA,  insomnia, sleep walkers.    Social history:  Patient is working as Regulatory affairs officer, Mining engineer, and plans to fully retire soon-   and lives in a household with spouse, no children, raised , 5 stepchildren persons. Family status is remarried. Pets are not present. Tobacco use: none  ETOH use; 4 drinks a week-,  Caffeine intake in form of Coffee( 1 cup in AM ) . Regular exercise: not currently .         Sleep habits are as follows: The patient's dinner time is between 4.30 PM. The patient goes to bed at 10.30 PM - he sometimes watches TV- and continues to sleep for 2-4 hours, wakes for one bathroom breaks, the first time at 2 AM.   The preferred sleep position is right sided, with the support of one  pillow. Dreams are reportedly very frequent/vivid. Some times nightmarish.  5.30  AM is the usual rise time. The patient wakes up spontaneously/.  He reports not feeling refreshed or restored in AM, with symptoms such as dry mouth, morning headaches, and residual fatigue.  Naps are taken frequently on weekends, lasting from 15-20  minutes and are more refreshing than nocturnal sleep. He dreams in his naps.       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) ([place])    HYPERLIPIDEMIA 07/13/2007   HYPERTENSION 07/13/2007   Impaired glucose tolerance 09/27/2016   Iron deficiency anemia due to chronic blood loss 02/27/2021   NEPHROLITHIASIS, 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   NSVT (nonsustained ventricular tachycardia) ([place])    PEPTIC ULCER  DISEASE 07/13/2007   PVC's (premature ventricular contractions)    SVT (supraventricular tachycardia)    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    PSH:  Past Surgical History:  Procedure Laterality Date   BROW LIFT Bilateral 11/10/2020   Procedure: BLEPHAROPLASTY;  Surgeon: [name], MD;  Location: [facility];  Service: Plastics;  Laterality: Bilateral;  1 hour   COLONOSCOPY N/A 04/12/2022   Procedure: COLONOSCOPY;  Surgeon: Arta Silence, MD;  [facility];  Service: Gastroenterology;  Laterality: N/A;   COLONOSCOPY WITH PROPOFOL N/A 02/17/2019   Procedure: COLONOSCOPY WITH PROPOFOL;  Surgeon: Ronnette Juniper, MD;  [facility];  Service: Gastroenterology;  Laterality: N/A;   CORONARY STENT INTERVENTION N/A 08/25/2021   Procedure: CORONARY STENT INTERVENTION;  Surgeon: Jettie Booze, MD;  Location: [facility];  Service: Cardiovascular;  Laterality: N/A;   HYDROCELE EXCISION Right 05/15/2003   INTRAVASCULAR LITHOTRIPSY  08/25/2021   Procedure: INTRAVASCULAR LITHOTRIPSY;  Surgeon: Jettie Booze, MD;  Location: [facility];  Service: Cardiovascular;;   INTRAVASCULAR ULTRASOUND/IVUS N/A 08/25/2021   Procedure: Intravascular Ultrasound/IVUS;  Surgeon: Jettie Booze, MD;  Location: [facility];  Service: Cardiovascular;  Laterality: N/A;   IR ANGIOGRAM VISCERAL SELECTIVE  02/16/2019   IR ANGIOGRAM VISCERAL SELECTIVE  02/16/2019   IR ANGIOGRAM VISCERAL SELECTIVE  02/16/2019   IR US GUIDE VASC ACCESS RIGHT  02/16/2019   LEFT HEART CATH AND CORONARY ANGIOGRAPHY N/A 07/28/2021   Procedure: LEFT HEART CATH AND CORONARY ANGIOGRAPHY;  Surgeon: Jettie Booze, MD;  Location: [place];  Service: Cardiovascular;  Laterality: N/A;   LEFT HEART CATH AND CORONARY ANGIOGRAPHY N/A 08/25/2021   Procedure: LEFT HEART CATH AND CORONARY ANGIOGRAPHY;  Surgeon: Jettie Booze, MD;  Location: [facility];   Service: Cardiovascular;  Laterality: N/A;   ROTATOR CUFF REPAIR     SPERMATOCELECTOMY Right 05/15/2003    Social History:  Social History   Socioeconomic History   Marital status: Married    Spouse name: Not on file   Number of children: Not on file   Years of education: Not on file   Highest education level: Not on file  Occupational History   Not on file  Tobacco Use   Smoking status: Never   Smokeless tobacco: Never  Vaping Use   Vaping Use: Never used  Substance and Sexual Activity   Alcohol use: Yes    Comment: couple times a week - wine   Drug use: No   Sexual activity: Not Currently  Other Topics Concern   Not on file  Social History Narrative   Lives alone   Right handed   Caffeine: 1 cup of coffee a day   Social Determinants of Health   Financial Resource Strain: Low Risk  (12/18/2021)   Overall Financial Resource Strain (CARDIA)    Difficulty of Paying Living Expenses: Not hard at all  Food Insecurity: Not on file  Transportation Needs: No Transportation Needs (01/15/2021)   [place] - Transportation  Lack of Transportation (Medical): No    Lack of Transportation (Non-Medical): No  Physical Activity: Not on file  Stress: Not on file  Social Connections: Not on file  Intimate Partner Violence: Not on file    Family History:  Family History  Problem Relation Age of Onset   Cancer Brother     Medications:   Current Outpatient Medications on File Prior to Visit  Medication Sig Dispense Refill   Ascorbic Acid (VITAMIN C) 1000 MG tablet Take 1,000 mg by mouth 2 (two) times a week.     aspirin EC 81 MG tablet Take 1 tablet (81 mg total) by mouth daily. Swallow whole. 90 tablet 3   fluticasone (FLONASE) 50 MCG/ACT nasal spray Place 1 spray into both nostrils 2 (two) times daily. 15.8 mL 6   ipratropium (ATROVENT) 0.03 % nasal spray Place 1-2 sprays into both nostrils 2 (two) times daily as needed (nasal drainage). 30 mL 5   latanoprost (XALATAN) 0.005 %  ophthalmic solution Place 1 drop into both eyes at bedtime. 7.5 mL 1   MAGNESIUM PO Take 400 mg by mouth at bedtime.     metoprolol succinate (TOPROL XL) 25 MG 24 hr tablet Take 1 tablet (25 mg total) by mouth daily. 90 tablet 3   Multiple Vitamin (MULTIVITAMIN WITH MINERALS) TABS tablet Take 1 tablet by mouth daily.     nitroGLYCERIN (NITROSTAT) 0.4 MG SL tablet Place 1 tablet (0.4 mg total) under the tongue every 5 (five) minutes as needed for chest pain. 25 tablet 3   pantoprazole (PROTONIX) 40 MG tablet Take 1 tablet (40 mg total) by mouth daily. 30 tablet 11   Polyvinyl Alcohol-Povidone (REFRESH OP) Place 1 drop into the left eye 2 (two) times daily as needed (dryness).     psyllium (METAMUCIL) 58.6 % powder Take 1 packet by mouth daily.     rosuvastatin (CRESTOR) 20 MG tablet Take 1 tablet (20 mg total) by mouth daily. 30 tablet 1   tadalafil (CIALIS) 20 MG tablet Take 1 tablet (20 mg total) by mouth daily as needed for erectile dysfunction. 30 tablet 2   tamsulosin (FLOMAX) 0.4 MG CAPS capsule TAKE 1 CAPSULE BY MOUTH  DAILY 90 capsule 3   tiZANidine (ZANAFLEX) 4 MG tablet Take 1 tablet (4 mg total) by mouth every 6 (six) hours as needed for muscle spasms. 30 tablet 0   traMADol (ULTRAM) 50 MG tablet Take 1 tablet (50 mg total) by mouth every 12 (twelve) hours as needed for up to 5 days. 10 tablet 0   valsartan (DIOVAN) 320 MG tablet Take 1 tablet (320 mg total) by mouth daily. 90 tablet 3   No current facility-administered medications on file prior to visit.    Allergies:  No Known Allergies    OBJECTIVE:  Physical Exam  There were no vitals filed for this visit. There is no height or weight on file to calculate BMI. No results found.   General: well developed, well nourished, seated, in no evident distress Head: head normocephalic and atraumatic.   Neck: supple with no carotid or supraclavicular bruits Cardiovascular: regular rate and rhythm, no murmurs Musculoskeletal: no  deformity Skin:  no rash/petichiae Vascular:  Normal pulses all extremities   Neurologic Exam Mental Status: Awake and fully alert. Oriented to place and time. Recent and remote memory intact. Attention span, concentration and fund of knowledge appropriate. Mood and affect appropriate.  Cranial Nerves: Pupils equal, briskly reactive to light. Extraocular movements full without nystagmus.  Visual fields full to confrontation. Hearing intact. Facial sensation intact. Face, tongue, palate moves normally and symmetrically.  Motor: Normal bulk and tone. Normal strength in all tested extremity muscles Sensory.: intact to touch , pinprick , position and vibratory sensation.  Coordination: Rapid alternating movements normal in all extremities. Finger-to-nose and heel-to-shin performed accurately bilaterally. Gait and Station: Arises from chair without difficulty. Stance is normal. Gait demonstrates normal stride length and balance without use of AD. Tandem walk and heel toe without difficulty.  Reflexes: 1+ and symmetric. Toes downgoing.         ASSESSMENT/PLAN: Dequarius Jeffries is a 81 y.o. year old male     OSA on CPAP : Compliance report shows satisfactory usage with optimal residual AHI.  Discussed continued nightly usage with ensuring greater than 4 hours nightly for optimal benefit and per insurance purposes.  Continue to follow with DME company for any needed supplies or CPAP related concerns     Follow up in *** or call earlier if needed   CC:  PCP: Rudd, Lillette Boxer, MD    I spent *** minutes of face-to-face and non-face-to-face time with patient.  This included previsit chart review, lab review, study review, order entry, electronic health record documentation, patient education regarding diagnosis of sleep apnea with review and discussion of compliance report and answered all other questions to patient's satisfaction   Frann Rider, [facility]  [facility] [address] [place], [place] [address]  Phone [phone] Fax [phone] Note: This document was prepared with digital dictation and possible smart phrase technology. Any transcriptional errors that result from this process are unintentional.

## 2022-09-08 ENCOUNTER — Ambulatory Visit: Payer: Medicare Other | Admitting: Adult Health

## 2022-09-08 ENCOUNTER — Encounter: Payer: Self-pay | Admitting: Adult Health

## 2022-09-08 VITALS — BP 175/89 | HR 56 | Ht 64.0 in | Wt 173.0 lb

## 2022-09-08 DIAGNOSIS — G4733 Obstructive sleep apnea (adult) (pediatric): Secondary | ICD-10-CM | POA: Diagnosis not present

## 2022-09-08 NOTE — Progress Notes (Signed)
Community message sent to DME on file. [facility] 09/08/22 New orders have been placed for the above pt.

## 2022-09-08 NOTE — Patient Instructions (Addendum)
Continue nightly use of CPAP ensuring greater than 4 hours per night for optimal benefit and per insurance requirements  Continue to follow with your DME company [employer] for any needed supplies or CPAP related concerns    Follow up in 6 months or call earlier if needed

## 2022-09-09 ENCOUNTER — Ambulatory Visit (INDEPENDENT_AMBULATORY_CARE_PROVIDER_SITE_OTHER): Payer: Medicare Other

## 2022-09-09 ENCOUNTER — Encounter: Payer: Self-pay | Admitting: Family Medicine

## 2022-09-09 ENCOUNTER — Ambulatory Visit (INDEPENDENT_AMBULATORY_CARE_PROVIDER_SITE_OTHER): Payer: Medicare Other | Admitting: Family Medicine

## 2022-09-09 VITALS — BP 138/82 | HR 62 | Temp 97.8°F | Ht 64.0 in | Wt 174.8 lb

## 2022-09-09 DIAGNOSIS — M4317 Spondylolisthesis, lumbosacral region: Secondary | ICD-10-CM | POA: Diagnosis not present

## 2022-09-09 DIAGNOSIS — M47816 Spondylosis without myelopathy or radiculopathy, lumbar region: Secondary | ICD-10-CM | POA: Diagnosis not present

## 2022-09-09 DIAGNOSIS — M545 Low back pain, unspecified: Secondary | ICD-10-CM

## 2022-09-09 DIAGNOSIS — I5020 Unspecified systolic (congestive) heart failure: Secondary | ICD-10-CM | POA: Diagnosis not present

## 2022-09-09 DIAGNOSIS — R079 Chest pain, unspecified: Secondary | ICD-10-CM

## 2022-09-09 DIAGNOSIS — R2681 Unsteadiness on feet: Secondary | ICD-10-CM | POA: Diagnosis not present

## 2022-09-09 NOTE — Progress Notes (Unsigned)
Initial visit for bilateral low back pain without sciatica. Pt denies injury

## 2022-09-09 NOTE — Progress Notes (Signed)
[facility] [address] [place] [address] Dept: [phone] Dept Fax: [phone]  Office Visit  Subjective:    [name], male    DOB: July 21, 1941, 81 y.o..   MRN: [id number]  Chief Complaint  Patient presents with   Hospitalization [facility] f/u .  C/o still having pain in the lower Rt back x 10 days  and having SOB x 2 days  Has taken Tylenol.      History of Present Illness:  Patient is in today for follow up from a  recent ED visit. David Goodman was seen at [facility] with a complaint of chest pain. He had to take three NTG for this to resolve at home. He notes they did labs and an x-ray, kept him for several hours, and then released him home. He feels that his use of a muscle relaxer more recently may have set this off. He has stopped the tizanidine and has not had a return of any symptoms. He was advised to follow up with his cardiologist as well.  David Goodman was seen 2 days prior to this with acute lower right back pain. He notes this continues to give him some difficulty. He denies any lower extremity weakness or numbness. He has had his wife massage his right lower back and this does help provide temporary relief. In addition, he notes that he has difficulty walking at times. He wonders if this is because of some of his medicines. He has some mild dizziness when he stands up, but has learned to pause until this resolves. He finds when he walks he tends to stagger some to one side.  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   GIB (gastrointestinal bleeding) 04/11/2022   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   Vivid dream 09/21/2021   Excessive daytime sleepiness 09/21/2021   Snoring 09/21/2021   Coronary artery disease involving native coronary artery of native heart 09/21/2021   SOBOE (shortness of breath on exertion) 09/21/2021   Coronary artery disease    HFrEF (heart failure with reduced ejection fraction) ([place])    Bell's palsy, left 06/05/2021   Cerebrovascular disease 06/05/2021   Hiatal hernia 05/29/2021   Glaucoma 02/27/2021   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   Erectile dysfunction due to arterial insufficiency 02/27/2021   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   Hemorrhoids 07/17/2020   RLS (restless legs syndrome) 10/17/2019   Syncope 11/15/2018   Diverticulitis 05/25/2018   Osteoarthritis of right knee 12/13/2017   Osteoarthritis of left knee 12/13/2017   Testosterone deficiency 03/22/2017   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/27/2016   BPH (benign prostatic hyperplasia) 06/18/2013   HLD (hyperlipidemia) 07/13/2007   Essential hypertension 07/13/2007   Allergic rhinitis 07/13/2007   Peptic ulcer disease 07/13/2007   History of nephrolithiasis 07/13/2007   Past Surgical History:  Procedure Laterality Date   BROW LIFT Bilateral 11/10/2020   Procedure: BLEPHAROPLASTY;  Surgeon: [name], MD;  Location: [facility];  Service: Plastics;  Laterality: Bilateral;  1 hour   COLONOSCOPY N/A 04/12/2022   Procedure: COLONOSCOPY;  Surgeon: Arta Silence, MD;  [facility];  Service: Gastroenterology;  Laterality: N/A;   COLONOSCOPY WITH PROPOFOL N/A 02/17/2019   Procedure: COLONOSCOPY WITH PROPOFOL;  Surgeon: Ronnette Juniper, MD;  [facility];  Service: Gastroenterology;  Laterality: N/A;   CORONARY  STENT INTERVENTION N/A 08/25/2021   Procedure: CORONARY STENT INTERVENTION;  Surgeon: Jettie Booze, MD;  Location: [facility];  Service: Cardiovascular;  Laterality: N/A;   HYDROCELE EXCISION Right 05/15/2003   INTRAVASCULAR LITHOTRIPSY  08/25/2021   Procedure: INTRAVASCULAR LITHOTRIPSY;  Surgeon: Jettie Booze, MD;  Location: [facility];  Service: Cardiovascular;;   INTRAVASCULAR ULTRASOUND/IVUS N/A 08/25/2021   Procedure: Intravascular Ultrasound/IVUS;  Surgeon: Jettie Booze, MD;  Location: [facility];  Service: Cardiovascular;  Laterality: N/A;   IR ANGIOGRAM VISCERAL SELECTIVE  02/16/2019   IR ANGIOGRAM VISCERAL SELECTIVE  02/16/2019   IR ANGIOGRAM VISCERAL SELECTIVE  02/16/2019   IR US GUIDE VASC ACCESS RIGHT  02/16/2019   LEFT HEART CATH AND CORONARY ANGIOGRAPHY N/A 07/28/2021   Procedure: LEFT HEART CATH AND CORONARY ANGIOGRAPHY;  Surgeon: Jettie Booze, MD;  Location: [facility];  Service: Cardiovascular;  Laterality: N/A;   LEFT HEART CATH AND CORONARY ANGIOGRAPHY N/A 08/25/2021   Procedure: LEFT HEART CATH AND CORONARY ANGIOGRAPHY;  Surgeon: Jettie Booze, MD;  Location: [facility];  Service: Cardiovascular;  Laterality: N/A;   ROTATOR CUFF REPAIR     SPERMATOCELECTOMY Right 05/15/2003   Family History  Problem Relation Age of Onset   Cancer Brother    Outpatient Medications Prior to Visit  Medication Sig Dispense Refill   Ascorbic Acid (VITAMIN C) 1000 MG tablet Take 1,000 mg by mouth 2 (two) times a week.     aspirin EC 81 MG tablet Take 1 tablet (81 mg total) by mouth daily. Swallow whole. 90 tablet 3   fluticasone (FLONASE) 50 MCG/ACT nasal spray Place 1 spray into both nostrils 2 (two) times daily. 15.8 mL 6   ipratropium (ATROVENT) 0.03 % nasal spray Place 1-2 sprays into both nostrils 2 (two) times daily as needed (nasal drainage). 30 mL 5   latanoprost (XALATAN) 0.005 % ophthalmic solution Place 1 drop  into both eyes at bedtime. 7.5 mL 1   MAGNESIUM PO Take 400 mg by mouth at bedtime.     metoprolol succinate (TOPROL XL) 25 MG 24 hr tablet Take 1 tablet (25 mg total) by mouth daily. 90 tablet 3   Multiple Vitamin (MULTIVITAMIN WITH MINERALS) TABS tablet Take 1 tablet by mouth daily.     nitroGLYCERIN (NITROSTAT) 0.4 MG SL tablet Place 1 tablet (0.4 mg total) under the tongue every 5 (five) minutes as needed for chest pain. 25 tablet 3   pantoprazole (PROTONIX) 40 MG tablet Take 1 tablet (40 mg total) by mouth daily. 30 tablet 11   Polyvinyl Alcohol-Povidone (REFRESH OP) Place 1 drop into the left eye 2 (two) times daily as needed (dryness).     psyllium (METAMUCIL) 58.6 % powder Take 1 packet by mouth daily.     rosuvastatin (CRESTOR) 20 MG tablet Take 1 tablet (20 mg total) by mouth daily. 30 tablet 1   tadalafil (CIALIS) 20 MG tablet Take 1 tablet (20 mg total) by mouth daily as needed for erectile dysfunction. 30 tablet 2   tamsulosin (FLOMAX) 0.4 MG CAPS  capsule TAKE 1 CAPSULE BY MOUTH  DAILY 90 capsule 3   valsartan (DIOVAN) 320 MG tablet Take 1 tablet (320 mg total) by mouth daily. 90 tablet 3   No facility-administered medications prior to visit.   No Known Allergies    Objective:   Today's Vitals   09/09/22 0948  BP: 138/82  Pulse: 62  Temp: 97.8 F (36.6 C)  TempSrc: Temporal  SpO2: 96%  Weight: 174 lb 12.8 oz (79.3 kg)  Height: '5\' 4"'$  (1.626 m)   Body mass index is 30 kg/m.   General: Well developed, well nourished. No acute distress. Back: Straight. Pain noted over the right lower back  just above the pelvic rim. There is a palpable   subcutaneous pad in this area. Extremities: Full ROM.  No edema noted. Strength 5/5. Neuro:Normal sensation bilaterally. DTR are absent bilat. Psych: Alert and oriented. Normal mood and affect.  Health Maintenance Due  Topic Date Due   Zoster Vaccines- Shingrix (1 of 2) Never done   Lab Results Component Ref Range & Units 4 d  ago (09/05/22) 4 d ago (09/05/22) 5 mo ago (04/11/22) 5 mo ago (04/11/22)  Troponin I (High Sensitivity) <18 ng/L 13  12 CM  10 CM  14 CM    Component Ref Range & Units 4 d ago  B Natriuretic Peptide 0.0 - 100.0 pg/mL 280.7 High        Latest Ref Rng & Units 09/05/2022    3:12 PM 08/10/2022   11:51 AM 05/21/2022    3:35 PM  BMP  Glucose 70 - 99 mg/dL 96  89  123   BUN 8 - 23 mg/dL '24  21  25   '$ Creatinine 0.61 - 1.24 mg/dL 1.05  0.97  0.89   BUN/Creat Ratio 10 - '24  22  28   '$ Sodium 135 - 145 mmol/L 140  138  138   Potassium 3.5 - 5.1 mmol/L 4.5  4.4  4.0   Chloride 98 - 111 mmol/L 102  99  103   CO2 22 - 32 mmol/L '31  23  20   '$ Calcium 8.9 - 10.3 mg/dL 8.9  9.3  9.6       Latest Ref Rng & Units 09/05/2022    3:12 PM 05/21/2022    3:35 PM 04/28/2022    3:50 PM  CBC  WBC 4.0 - 10.5 K/uL 6.4  9.7  8.1   Hemoglobin 13.0 - 17.0 g/dL 12.2  12.0  9.6   Hematocrit 39.0 - 52.0 % 38.2  36.3  29.6   Platelets 150 - 400 K/uL 250  289  371    Imaging: Chest x-ray (09/05/2022) IMPRESSION:. Blunting of bilateral costophrenic angles, likely trace pleural effusions versus pleural thickening.  Lumbar x-ray: Spondylosis of L3-L5 with significant deformity of the L4 vertebrae.There appears to be some spondylolisthesis at the L5-S1 vertebrae.   Assessment & Plan:   1. Acute bilateral low back pain without sciatica 2. Spondylosis of lumbar spine 3. 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 Statzer has significant arthritic changes in the lower lumbar spine. This is likely leading to muscular strain causing his current pain. As he did not tolerate a muscle relaxer. I recommend we focus more on physical therapy to try and get him some relief.  - DG Lumbar Spine Complete - Ambulatory referral to Physical Therapy  4. Gait instability I will also have PT work with him to improve his gait and strength.  - Ambulatory referral to Physical  Therapy  5. Chest pain, unspecified type The chest pain was  possibly a medication side effect. He had no sing of an acute CV event.  6. HFrEF (heart failure with reduced ejection fraction) (H[facility]) The BNP was mildly elevated. Recommend he follow up with Dr. Irish Lack as recommended. He should continue his valsartan 320 mg daily and metoprolol succinate 25 mg daily.  Return in about 6 weeks (around 10/21/2022) for Reassessment.   Haydee Salter, MD

## 2022-09-13 ENCOUNTER — Ambulatory Visit: Payer: Medicare Other | Attending: Physician Assistant | Admitting: Physician Assistant

## 2022-09-13 ENCOUNTER — Encounter: Payer: Self-pay | Admitting: Physician Assistant

## 2022-09-13 VITALS — BP 140/80 | HR 62 | Ht 64.0 in | Wt 175.2 lb

## 2022-09-13 DIAGNOSIS — E785 Hyperlipidemia, unspecified: Secondary | ICD-10-CM

## 2022-09-13 DIAGNOSIS — I2510 Atherosclerotic heart disease of native coronary artery without angina pectoris: Secondary | ICD-10-CM | POA: Diagnosis not present

## 2022-09-13 DIAGNOSIS — I10 Essential (primary) hypertension: Secondary | ICD-10-CM | POA: Diagnosis not present

## 2022-09-13 DIAGNOSIS — I5020 Unspecified systolic (congestive) heart failure: Secondary | ICD-10-CM

## 2022-09-13 NOTE — Progress Notes (Signed)
Cardiology Office Note:    Date:  09/13/2022   ID:  David Goodman, DOB 1941/07/17, MRN [id number]  PCP:  Haydee Salter, MD  [facility] Cardiologist:  Larae Grooms, MD  [facility] Electrophysiologist:  None   Chief Complaint: ER follow up   History of Present Illness:    David Goodman is a 81 y.o. male with a hx of  Coronary artery disease  S/p DES to p-mLAD in 9/22 Cath 9/22: OM1 80, D2 80, dLAD 54, mRCA 60 >> Med Rx HFmrEF (heart failure with mildly reduced ejection fraction)  Echocardiogram 8/22: 40-45, Gr 1 DD, global HK Echo 11/2021: LVEF 35-40% >> started Entresto Echo 03/2022: LVEF 40-45%, grade 1 DD Mitral Regurgitation (moderate by echo 11/2021 & Mild by 03/2022) Hypertension  Hyperlipidemia  Glucose intolerance GERD/PUD Iron deficiency anemia Aortic atherosclerosis  Bronchiectasis  He was taking Entresto 24/'26mg'$   BID when seen by Dr. Irish Lack 06/2022. Back to Valsartan due to cost.   Seen in ER 09/05/22 for chest pain. CP resolved with SL nitro x 3. Trop negative x 2. EKG without acute findings. Outpatient follow up recommend.   Here today for follow-up.  He denies recurrent  chest pain.  Yesterday he was walking in the mall for 2 to 3 hours but no issue.  He usually rides stationary bicycle for 30 minutes each day and some lifting but has not done in the past 4 weeks as he traveled to [place] to visit his family.  He denies shortness of breath, orthopnea, PND, syncope, lower extremity edema or melena.  Blood pressure runs in 130 over 70s at home.  Past Medical History:  Diagnosis Date   ALLERGIC RHINITIS 07/13/2007   Aorta dilated on Echo; Normal sized Aorta on CT    Chest CT 1/23: No thoracic aortic aneurysm; coronary calcifications; aortic atherosclerosis; improved aeration of lungs with persistent minimal reticular opacities   CAD (coronary artery disease)    GERD (gastroesophageal reflux disease)    GI bleed    Heart failure with mid-range ejection  fraction (HFmEF) ([place])    HYPERLIPIDEMIA 07/13/2007   HYPERTENSION 07/13/2007   Impaired glucose tolerance 09/27/2016   Iron deficiency anemia due to chronic blood loss 02/27/2021   NEPHROLITHIASIS, 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   NSVT (nonsustained ventricular tachycardia) ([place])    PEPTIC ULCER DISEASE 07/13/2007   PVC's (premature ventricular contractions)    SVT (supraventricular tachycardia)    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    Past Surgical History:  Procedure Laterality Date   BROW LIFT Bilateral 11/10/2020   Procedure: BLEPHAROPLASTY;  Surgeon: [name], MD;  Location: [place];  Service: Plastics;  Laterality: Bilateral;  1 hour   COLONOSCOPY N/A 04/12/2022   Procedure: COLONOSCOPY;  Surgeon: Arta Silence, MD;  Location: [facility];  Service: Gastroenterology;  Laterality: N/A;   COLONOSCOPY WITH PROPOFOL N/A 02/17/2019   Procedure: COLONOSCOPY WITH PROPOFOL;  Surgeon: Ronnette Juniper, MD;  Location: [facility];  Service: Gastroenterology;  Laterality: N/A;   CORONARY STENT INTERVENTION N/A 08/25/2021   Procedure: CORONARY STENT INTERVENTION;  Surgeon: Jettie Booze, MD;  Location: [place] [facility];  Service: Cardiovascular;  Laterality: N/A;   HYDROCELE EXCISION Right 05/15/2003   INTRAVASCULAR LITHOTRIPSY  08/25/2021   Procedure: INTRAVASCULAR LITHOTRIPSY;  Surgeon: Jettie Booze, MD;  Location: [place] [facility];  Service: Cardiovascular;;   INTRAVASCULAR ULTRASOUND/IVUS N/A 08/25/2021   Procedure: Intravascular Ultrasound/IVUS;  Surgeon: Jettie Booze, MD;  Location: [place] [facility];  Service: Cardiovascular;  Laterality: N/A;   IR ANGIOGRAM VISCERAL SELECTIVE  02/16/2019   IR ANGIOGRAM VISCERAL SELECTIVE  02/16/2019   IR ANGIOGRAM VISCERAL SELECTIVE  02/16/2019   IR US GUIDE VASC ACCESS RIGHT  02/16/2019   LEFT HEART CATH AND CORONARY ANGIOGRAPHY N/A 07/28/2021   Procedure: LEFT HEART CATH AND CORONARY ANGIOGRAPHY;  Surgeon:  Jettie Booze, MD;  Location: [place] [facility];  Service: Cardiovascular;  Laterality: N/A;   LEFT HEART CATH AND CORONARY ANGIOGRAPHY N/A 08/25/2021   Procedure: LEFT HEART CATH AND CORONARY ANGIOGRAPHY;  Surgeon: Jettie Booze, MD;  Location: [place] [facility];  Service: Cardiovascular;  Laterality: N/A;   ROTATOR CUFF REPAIR     SPERMATOCELECTOMY Right 05/15/2003    Current Medications: Current Meds  Medication Sig   Ascorbic Acid (VITAMIN C) 1000 MG tablet Take 1,000 mg by mouth 2 (two) times a week.   aspirin EC 81 MG tablet Take 1 tablet (81 mg total) by mouth daily. Swallow whole.   fluticasone (FLONASE) 50 MCG/ACT nasal spray Place 1 spray into both nostrils 2 (two) times daily.   ipratropium (ATROVENT) 0.03 % nasal spray Place 1-2 sprays into both nostrils 2 (two) times daily as needed (nasal drainage).   latanoprost (XALATAN) 0.005 % ophthalmic solution Place 1 drop into both eyes at bedtime.   MAGNESIUM PO Take 400 mg by mouth at bedtime.   metoprolol succinate (TOPROL XL) 25 MG 24 hr tablet Take 1 tablet (25 mg total) by mouth daily.   Multiple Vitamin (MULTIVITAMIN WITH MINERALS) TABS tablet Take 1 tablet by mouth daily.   nitroGLYCERIN (NITROSTAT) 0.4 MG SL tablet Place 1 tablet (0.4 mg total) under the tongue every 5 (five) minutes as needed for chest pain.   pantoprazole (PROTONIX) 40 MG tablet Take 1 tablet (40 mg total) by mouth daily.   Polyvinyl Alcohol-Povidone (REFRESH OP) Place 1 drop into the left eye 2 (two) times daily as needed (dryness).   psyllium (METAMUCIL) 58.6 % powder Take 1 packet by mouth daily.   rosuvastatin (CRESTOR) 20 MG tablet Take 1 tablet (20 mg total) by mouth daily.   tadalafil (CIALIS) 20 MG tablet Take 1 tablet (20 mg total) by mouth daily as needed for erectile dysfunction.   tamsulosin (FLOMAX) 0.4 MG CAPS capsule TAKE 1 CAPSULE BY MOUTH  DAILY   valsartan (DIOVAN) 320 MG tablet Take 1 tablet (320 mg total) by mouth daily.      Allergies:   Patient has no known allergies.   Social History   Socioeconomic History   Marital status: Married    Spouse name: Not on file   Number of children: Not on file   Years of education: Not on file   Highest education level: Not on file  Occupational History   Not on file  Tobacco Use   Smoking status: Never   Smokeless tobacco: Never  Vaping Use   Vaping Use: Never used  Substance and Sexual Activity   Alcohol use: Yes    Comment: couple times a week - wine   Drug use: No   Sexual activity: Not Currently  Other Topics Concern   Not on file  Social History Narrative   Lives alone   Right handed   Caffeine: 1 cup of coffee a day   Social Determinants of Health   Financial Resource Strain: Low Risk  (12/18/2021)   Overall Financial Resource Strain (CARDIA)    Difficulty of Paying Living Expenses: Not hard at  all  Food Insecurity: Not on file  Transportation Needs: No Transportation Needs (01/15/2021)   PRAPARE - Hydrologist (Medical): No    Lack of Transportation (Non-Medical): No  Physical Activity: Not on file  Stress: Not on file  Social Connections: Not on file     Family History: The patient's family history includes Cancer in his brother.    ROS:   Please see the history of present illness.    All other systems reviewed and are negative.   EKGs/Labs/Other Studies Reviewed:    The following studies were reviewed today:  Monitor 11/2021 Normal sinus rhythm with rare SVT and NSVT. Frequent PVCs with rare PACs noted. No symptoms associated with premature beats. No atrial fibrillation.     Patch Wear Time:  2 days and 23 hours (2023-01-20T10:05:48-0500 to 2023-01-23T09:20:41-0500)   Patient had a min HR of 55 bpm, max HR of 226 bpm, and avg HR of 80 bpm. Predominant underlying rhythm was Sinus Rhythm. 14 Ventricular Tachycardia runs occurred, the run with the fastest interval lasting 4 beats with a max rate of 226  bpm, the longest  lasting 12 beats with an avg rate of 127 bpm. 10 Supraventricular Tachycardia runs occurred, the run with the fastest interval lasting 14 beats with a max rate of 184 bpm, the longest lasting 16 beats with an avg rate of 96 bpm. Isolated SVEs were rare  (<1.0%), SVE Couplets were rare (<1.0%), and SVE Triplets were rare (<1.0%). Isolated VEs were frequent (7.1%, 24152), VE Couplets were rare (<1.0%, 769), and VE Triplets were rare (<1.0%, 51). Ventricular Bigeminy and Trigeminy were present.     ECho 03/2022 IMPRESSIONS    1. Left ventricular ejection fraction, by estimation, is 40 to 45%. The  left ventricle has mildly decreased function. The left ventricle  demonstrates global hypokinesis. There is mild asymmetric left ventricular  hypertrophy. Left ventricular diastolic  parameters are consistent with Grade I diastolic dysfunction (impaired  relaxation).   2. Right ventricular systolic function is normal. The right ventricular  size is normal.   3. The mitral valve is normal in structure. Mild mitral valve  regurgitation. No evidence of mitral stenosis.   4. The aortic valve is tricuspid. Aortic valve regurgitation is not  visualized. Aortic valve sclerosis/calcification is present, without any  evidence of aortic stenosis.   5. The inferior vena cava is normal in size with greater than 50%  respiratory variability, suggesting right atrial pressure of 3 mmHg.   Comparison(s): The left ventricular function has improved slighlty in  11/2021 was 35-40% now 40-45%.    INTRAVASCULAR LITHOTRIPSY  08/25/21  CORONARY STENT INTERVENTION  Intravascular Ultrasound/IVUS  LEFT HEART CATH AND CORONARY ANGIOGRAPHY   Conclusion      Prox LAD to Mid LAD lesion is 75% stenosed.   After shockwave lithotripsy, a drug-eluting stent was successfully placed using a STENT ONYX FRONTIER 3.0X38, postdilated to 4 mm.  The stent was optimized with intravascular ultrasound.   Post  intervention, there is a 0% residual stenosis.   Moderate diffuse disease noted in the remainder of the LAD.   LV end diastolic pressure is normal.   There is no aortic valve stenosis.   Continue dual antiplatelet therapy for at least 6 months.  Continue aggressive secondary prevention and risk factor modification.  Plan for same-day PCI.   Left radial was used due to known tortuosity in the right subclavian.  Diagnostic Dominance: Right  Intervention  Echo 07/28/21 Diagnostic Dominance: Right   EKG:  EKG is not  ordered today.  Recent Labs: 04/16/2022: ALT 13 04/28/2022: Magnesium 2.3 05/12/2022: TSH 4.93 09/05/2022: B Natriuretic Peptide 280.7; BUN 24; Creatinine, Ser 1.05; Hemoglobin 12.2; Platelets 250; Potassium 4.5; Sodium 140  Recent Lipid Panel    Component Value Date/Time   CHOL 114 12/09/2021 0838   TRIG 189 (H) 12/09/2021 0838   HDL 42 12/09/2021 0838   CHOLHDL 2.7 12/09/2021 0838   CHOLHDL 4 02/27/2021 0950   VLDL 46.4 (H) 02/27/2021 0950   LDLCALC 41 12/09/2021 0838   LDLDIRECT 129.0 02/27/2021 0950     Physical Exam:    VS:  BP (!) 178/86   Pulse 62   Ht '5\' 4"'$  (1.626 m)   Wt 175 lb 3.2 oz (79.5 kg)   SpO2 97%   BMI 30.07 kg/m     Wt Readings from Last 3 Encounters:  09/13/22 175 lb 3.2 oz (79.5 kg)  09/09/22 174 lb 12.8 oz (79.3 kg)  09/08/22 173 lb (78.5 kg)     GEN:  Well nourished, well developed in no acute distress HEENT: Normal NECK: No JVD; No carotid bruits LYMPHATICS: No lymphadenopathy CARDIAC: RRR, no murmurs, rubs, gallops RESPIRATORY:  Clear to auscultation without rales, wheezing or rhonchi  ABDOMEN: Soft, non-tender, non-distended MUSCULOSKELETAL:  No edema; No deformity  SKIN: Warm and dry NEUROLOGIC:  Alert and oriented x 3 PSYCHIATRIC:  Normal affect   ASSESSMENT AND PLAN:    Chest pain with history of CAD No recurrent episode.  Yesterday he walked for 2 to 3 hours in the mall but no issue.  He is active at baseline  riding stationary bicycle and lifting some weight.  He will go back to his exercise and let us know if recurrent chest pain.  He is not interested in Imdur or stress test currently.  I agree.  Continue aspirin, statin and beta-blocker.  2.  Hypertension Blood pressure controlled on valsartan and Toprol-XL  3.  HFmrEF -Euvolemic.  Continue Toprol-XL and valsartan.  Recently discontinued Entresto due to cost.  4.  Hyperlipidemia -Continue statin -12/09/2021: Cholesterol, Total 114; HDL 42; LDL Chol Calc (NIH) 41; Triglycerides 189   Medication Adjustments/Labs and Tests Ordered: Current medicines are reviewed at length with the patient today.  Concerns regarding medicines are outlined above.  No orders of the defined types were placed in this encounter.  No orders of the defined types were placed in this encounter.   Patient Instructions  Medication Instructions:  Your physician recommends that you continue on your current medications as directed. Please refer to the Current Medication list given to you today.n *If you need a refill on your cardiac medications before your next appointment, please call your pharmacy*   Lab Work: None Ordered   Testing/Procedures: None Ordered   Follow-Up: At [facility], you and your health needs are our priority.  As part of our continuing mission to provide you with exceptional heart care, we have created designated Provider Care Teams.  These Care Teams include your primary Cardiologist (physician) and Advanced Practice Providers (APPs -  Physician Assistants and Nurse Practitioners) who all work together to provide you with the care you need, when you need it.  We recommend signing up for the patient portal called "MyChart".  Sign up information is provided on this After Visit Summary.  MyChart is used to connect with patients for Virtual Visits (Telemedicine).  Patients are able to view lab/test results, encounter  notes, upcoming  appointments, etc.  Non-urgent messages can be sent to your provider as well.   To learn more about what you can do with MyChart, go to [email or web address].    Your next appointment:   3-4 month(s)  The format for your next appointment:   In Person  Provider:   Larae Grooms, MD     Other Instructions   Important Information About Sugar         Signed, Leanor Kail, [place]  09/13/2022 2:22 PM    [place]

## 2022-09-13 NOTE — Patient Instructions (Signed)
Medication Instructions:  Your physician recommends that you continue on your current medications as directed. Please refer to the Current Medication list given to you today.n *If you need a refill on your cardiac medications before your next appointment, please call your pharmacy*   Lab Work: None Ordered   Testing/Procedures: None Ordered   Follow-Up: At [facility], you and your health needs are our priority.  As part of our continuing mission to provide you with exceptional heart care, we have created designated Provider Care Teams.  These Care Teams include your primary Cardiologist (physician) and Advanced Practice Providers (APPs -  Physician Assistants and Nurse Practitioners) who all work together to provide you with the care you need, when you need it.  We recommend signing up for the patient portal called "MyChart".  Sign up information is provided on this After Visit Summary.  MyChart is used to connect with patients for Virtual Visits (Telemedicine).  Patients are able to view lab/test results, encounter notes, upcoming appointments, etc.  Non-urgent messages can be sent to your provider as well.   To learn more about what you can do with MyChart, go to [email or web address].    Your next appointment:   3-4 month(s)  The format for your next appointment:   In Person  Provider:   Larae Grooms, MD     Other Instructions   Important Information About Sugar

## 2022-09-14 ENCOUNTER — Ambulatory Visit: Payer: Medicare Other

## 2022-09-14 ENCOUNTER — Other Ambulatory Visit: Payer: Self-pay

## 2022-09-14 DIAGNOSIS — H25811 Combined forms of age-related cataract, right eye: Secondary | ICD-10-CM | POA: Diagnosis not present

## 2022-09-14 DIAGNOSIS — H04123 Dry eye syndrome of bilateral lacrimal glands: Secondary | ICD-10-CM | POA: Diagnosis not present

## 2022-09-14 DIAGNOSIS — E785 Hyperlipidemia, unspecified: Secondary | ICD-10-CM

## 2022-09-14 DIAGNOSIS — G4733 Obstructive sleep apnea (adult) (pediatric): Secondary | ICD-10-CM | POA: Diagnosis not present

## 2022-09-14 DIAGNOSIS — H401122 Primary open-angle glaucoma, left eye, moderate stage: Secondary | ICD-10-CM | POA: Diagnosis not present

## 2022-09-14 DIAGNOSIS — H524 Presbyopia: Secondary | ICD-10-CM | POA: Diagnosis not present

## 2022-09-14 DIAGNOSIS — H401111 Primary open-angle glaucoma, right eye, mild stage: Secondary | ICD-10-CM | POA: Diagnosis not present

## 2022-09-14 LAB — HM DIABETES EYE EXAM

## 2022-09-14 MED ORDER — ROSUVASTATIN CALCIUM 20 MG PO TABS: 20.0000 mg | ORAL_TABLET | Freq: Every day | ORAL | 1 refills | Status: DC

## 2022-09-15 ENCOUNTER — Ambulatory Visit: Payer: Medicare Other | Admitting: Allergy

## 2022-09-16 ENCOUNTER — Ambulatory Visit: Payer: Medicare Other | Admitting: Neurology

## 2022-09-16 DIAGNOSIS — G4733 Obstructive sleep apnea (adult) (pediatric): Secondary | ICD-10-CM | POA: Diagnosis not present

## 2022-09-17 NOTE — Patient Instructions (Incomplete)
Rhinitis  Use Atrovent (ipratropium) 0.03% 1-2 sprays per nostril twice a day as needed for runny nose/drainage. Sin testing today is positive  Follow up in  month or sooner if needed.

## 2022-09-20 ENCOUNTER — Ambulatory Visit: Payer: Medicare Other | Attending: Family Medicine | Admitting: Physical Therapy

## 2022-09-20 ENCOUNTER — Encounter: Payer: Self-pay | Admitting: Family

## 2022-09-20 ENCOUNTER — Encounter: Payer: Self-pay | Admitting: Physical Therapy

## 2022-09-20 ENCOUNTER — Ambulatory Visit: Payer: Medicare Other | Admitting: Family

## 2022-09-20 VITALS — BP 130/70 | HR 60 | Temp 97.8°F | Resp 16 | Ht 61.5 in | Wt 177.6 lb

## 2022-09-20 DIAGNOSIS — R262 Difficulty in walking, not elsewhere classified: Secondary | ICD-10-CM | POA: Insufficient documentation

## 2022-09-20 DIAGNOSIS — J310 Chronic rhinitis: Secondary | ICD-10-CM | POA: Diagnosis not present

## 2022-09-20 DIAGNOSIS — M4317 Spondylolisthesis, lumbosacral region: Secondary | ICD-10-CM | POA: Insufficient documentation

## 2022-09-20 DIAGNOSIS — M545 Low back pain, unspecified: Secondary | ICD-10-CM | POA: Insufficient documentation

## 2022-09-20 DIAGNOSIS — R278 Other lack of coordination: Secondary | ICD-10-CM | POA: Diagnosis not present

## 2022-09-20 DIAGNOSIS — M47816 Spondylosis without myelopathy or radiculopathy, lumbar region: Secondary | ICD-10-CM | POA: Insufficient documentation

## 2022-09-20 DIAGNOSIS — R2681 Unsteadiness on feet: Secondary | ICD-10-CM | POA: Insufficient documentation

## 2022-09-20 DIAGNOSIS — R293 Abnormal posture: Secondary | ICD-10-CM | POA: Insufficient documentation

## 2022-09-20 DIAGNOSIS — M6281 Muscle weakness (generalized): Secondary | ICD-10-CM | POA: Insufficient documentation

## 2022-09-20 NOTE — Therapy (Signed)
OUTPATIENT PHYSICAL THERAPY THORACOLUMBAR EVALUATION   Patient Name: David Goodman MRN: [id number] DOB:03/16/1941, 81 y.o., male Today's Date: 09/20/2022   PT End of Session - 09/20/22 1443     Visit Number 1    Date for PT Re-Evaluation 12/13/22    PT Start Time 1232    PT Stop Time 1310    PT Time Calculation (min) 38 min    Activity Tolerance Patient tolerated treatment well    Behavior During Therapy [facility] for tasks assessed/performed             Past Medical History:  Diagnosis Date   ALLERGIC RHINITIS 07/13/2007   Aorta dilated on Echo; Normal sized Aorta on CT    Chest CT 1/23: No thoracic aortic aneurysm; coronary calcifications; aortic atherosclerosis; improved aeration of lungs with persistent minimal reticular opacities   CAD (coronary artery disease)    GERD (gastroesophageal reflux disease)    GI bleed    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) ([place])    HYPERLIPIDEMIA 07/13/2007   HYPERTENSION 07/13/2007   Impaired glucose tolerance 09/27/2016   Iron deficiency anemia due to chronic blood loss 02/27/2021   NEPHROLITHIASIS, 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   NSVT (nonsustained ventricular tachycardia) ([place])    PEPTIC ULCER DISEASE 07/13/2007   PVC's (premature ventricular contractions)    SVT (supraventricular tachycardia)    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   Past Surgical History:  Procedure Laterality Date   BROW LIFT Bilateral 11/10/2020   Procedure: BLEPHAROPLASTY;  Surgeon: [name], MD;  Location: [place];  Service: Plastics;  Laterality: Bilateral;  1 hour   COLONOSCOPY N/A 04/12/2022   Procedure: COLONOSCOPY;  Surgeon: Arta Silence, MD;  Location: [facility];  Service: Gastroenterology;  Laterality: N/A;   COLONOSCOPY WITH PROPOFOL N/A 02/17/2019   Procedure: COLONOSCOPY WITH PROPOFOL;  Surgeon: Ronnette Juniper, MD;  Location: [facility];  Service: Gastroenterology;  Laterality: N/A;   CORONARY STENT  INTERVENTION N/A 08/25/2021   Procedure: CORONARY STENT INTERVENTION;  Surgeon: Jettie Booze, MD;  Location: [place] [facility];  Service: Cardiovascular;  Laterality: N/A;   HYDROCELE EXCISION Right 05/15/2003   INTRAVASCULAR LITHOTRIPSY  08/25/2021   Procedure: INTRAVASCULAR LITHOTRIPSY;  Surgeon: Jettie Booze, MD;  Location: [place] [facility];  Service: Cardiovascular;;   INTRAVASCULAR ULTRASOUND/IVUS N/A 08/25/2021   Procedure: Intravascular Ultrasound/IVUS;  Surgeon: Jettie Booze, MD;  Location: [place] [facility];  Service: Cardiovascular;  Laterality: N/A;   IR ANGIOGRAM VISCERAL SELECTIVE  02/16/2019   IR ANGIOGRAM VISCERAL SELECTIVE  02/16/2019   IR ANGIOGRAM VISCERAL SELECTIVE  02/16/2019   IR US GUIDE VASC ACCESS RIGHT  02/16/2019   LEFT HEART CATH AND CORONARY ANGIOGRAPHY N/A 07/28/2021   Procedure: LEFT HEART CATH AND CORONARY ANGIOGRAPHY;  Surgeon: Jettie Booze, MD;  Location: [place] [facility];  Service: Cardiovascular;  Laterality: N/A;   LEFT HEART CATH AND CORONARY ANGIOGRAPHY N/A 08/25/2021   Procedure: LEFT HEART CATH AND CORONARY ANGIOGRAPHY;  Surgeon: Jettie Booze, MD;  Location: [place] [facility];  Service: Cardiovascular;  Laterality: N/A;   ROTATOR CUFF REPAIR     SPERMATOCELECTOMY Right 05/15/2003   Patient Active Problem List   Diagnosis Date Noted   Spondylosis of lumbar spine 09/09/2022   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 09/09/2022   Gait instability 09/09/2022   Acute right-sided low back pain without sciatica 09/03/2022   Chronic rhinitis 08/04/2022   Iron deficiency anemia due to chronic blood loss 07/22/2022  Personal history of colonic polyps 07/22/2022   Gastro-esophageal reflux disease with esophagitis 07/22/2022   Gastric polyp 07/22/2022   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   GIB (gastrointestinal bleeding) 04/11/2022   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   Vivid dream 09/21/2021   Excessive daytime  sleepiness 09/21/2021   Snoring 09/21/2021   Coronary artery disease involving native coronary artery of native heart 09/21/2021   SOBOE (shortness of breath on exertion) 09/21/2021   Coronary artery disease    HFrEF (heart failure with reduced ejection fraction) (HCC)    Bell's palsy, left 06/05/2021   Cerebrovascular disease 06/05/2021   Hiatal hernia 05/29/2021   Glaucoma 02/27/2021   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   Erectile dysfunction due to arterial insufficiency 02/27/2021   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 12/29/2020   Hemorrhoids 07/17/2020   Syncope 11/15/2018   Diverticulitis 05/25/2018   Osteoarthritis of right knee 12/13/2017   Osteoarthritis of left knee 12/13/2017   Testosterone deficiency 03/22/2017   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/27/2016   BPH (benign prostatic hyperplasia) 06/18/2013   HLD (hyperlipidemia) 07/13/2007   Essential hypertension 07/13/2007   Allergic rhinitis 07/13/2007   Peptic ulcer disease 07/13/2007   History of nephrolithiasis 07/13/2007    PCP: Haydee Salter, MD   REFERRING PROVIDER: Haydee Salter, MD   REFERRING DIAG: Diagnosis M47.816 (ICD-10-CM) - Spondylosis of lumbar spine M43.17 (ICD-10-CM) - 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 It can be on R or L, tends to move side to side. Sometimes walking  improves the pain. He used to use exercise bike and treadmill, but has not been able to exercise in the past month and a half due to the pain.  PERTINENT HISTORY:  David Goodman was seen 2 days prior to this with acute lower right back pain. He notes this continues to give him some difficulty. He denies any lower extremity weakness or numbness. He has had his wife massage his right lower back and this does help provide temporary relief. In addition, he notes that he has  difficulty walking at times. He wonders if this is because of some of his medicines. He has some mild dizziness when he stands up, but has learned to pause until this resolves. He finds when he walks he tends to stagger some to one side.  Hiatal Hernia  PAIN:  Are you having pain? Yes: NPRS scale: 7/10 Pain location: low back, R and L, moves Pain description: sharp, goes into hips, but not legs Aggravating factors: Unknown Relieving factors: Naprosin helps, but he is limited in using it due to diverticulitis  PRECAUTIONS: None  WEIGHT BEARING RESTRICTIONS: No  FALLS:  Has patient fallen in last 6 months? Yes. 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   OBJECTIVE:   DIAGNOSTIC FINDINGS:  Lumbar x-ray: Spondylosis of L3-L5 with significant deformity of the L4 vertebrae.There appears to be some spondylolisthesis at the L5-S1 vertebrae.   1. Acute bilateral low back pain without sciatica 2. Spondylosis of lumbar spine 3. 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 Seales has significant arthritic changes in the lower lumbar spine. This is likely leading to muscular strain causing his current pain. As  he did not tolerate a muscle relaxer. I recommend we focus more on physical therapy to try and get him some relief.   SCREENING FOR RED FLAGS: Bowel or bladder incontinence: No Spinal tumors: No Cauda equina syndrome: No Compression fracture: No Abdominal aneurysm: No  COGNITION: Overall cognitive status: Within functional limits for tasks assessed    MUSCLE LENGTH: Hamstrings: Right 71 deg; Left 65 deg Thomas test: B hip flexor tightness  POSTURE: rounded shoulders, forward head, decreased lumbar lordosis, and flexed trunk   PALPATION: TTP L lower lumbar paraspinals, Glut medius, R glut medius, with trigger point in the muscle.  LUMBAR ROM:  Lumbar AROM WNL in all planes except B rotation mildly limited B.  LOWER EXTREMITY ROM:    Generally WFL, but mildly stiff in all planes of hip movement.   LOWER EXTREMITY MMT:    MMT Right eval Left eval  Hip flexion 4- 4-  Hip extension 3+ 3+  Hip abduction 4- 3+  Hip adduction    Hip internal rotation    Hip external rotation    Knee flexion 4- 4-  Knee extension 4- 4-  Ankle dorsiflexion 4- 4-  Ankle plantarflexion    Ankle inversion    Ankle eversion     (Blank rows = not tested)  LUMBAR SPECIAL TESTS:  Straight leg raise test: Negative and Slump test: Negative  FUNCTIONAL TESTS:  5 times sit to stand: 14.4 Functional gait assessment: 15  GAIT: Distance walked: in clinic Assistive device utilized: None Level of assistance: Complete Independence Comments: unsteady with lateral LOB to each side, slow.  TODAY'S TREATMENT:  DATE: 09/20/22    PATIENT EDUCATION:  Education details: POC Person educated: Patient Education method: Explanation Education comprehension: verbalized understanding  HOME EXERCISE PROGRAM: TBD  ASSESSMENT:  CLINICAL IMPRESSION: Patient is a 81 y.o. who was  seen today for physical therapy evaluation and treatment for new onset of LBP. Imaging identifies Spondylosis of L3-L5 with significant deformity of the L4 vertebrae.There appears to be some spondylolisthesis at the L5-S1 vertebrae. He is TTP along lower lumbar paraspinals on L as well as R glut medius, with trigger point in the glut med. He demonstrates weakness and some stiffness in his lower trunk and LE. Patient will benefit from PT to address his weakness, the trigger point, facilitate postural stability in order to decrease his pain and facilitate return to his PLOF which did include exercise.  OBJECTIVE IMPAIRMENTS: Abnormal gait, decreased activity tolerance, decreased balance, decreased coordination, difficulty walking, decreased ROM, decreased strength, increased muscle spasms, impaired flexibility, and pain.   ACTIVITY LIMITATIONS: carrying, lifting, bending, squatting, and locomotion level  PARTICIPATION LIMITATIONS: meal prep, cleaning, shopping, community activity, and yard work  PERSONAL FACTORS: Age and 1 comorbidity: lumbar spondylolisthesis  are also affecting patient's functional outcome.   REHAB POTENTIAL: Good  CLINICAL DECISION MAKING: Stable/uncomplicated  EVALUATION COMPLEXITY: Low   GOALS: Goals reviewed with patient? Yes  SHORT TERM GOALS: Target date: 10/18/2022  I with basic HEP Baseline: Goal status: INITIAL  LONG TERM GOALS: Target date: 12/13/2022  I with final HEP Baseline:  Goal status: INITIAL  2.  Increase FGA score to at least 24 Baseline: 15 Goal status: INITIAL  3.  Decrease 5X STS to < 12 sec Baseline: 14.4 Goal status: INITIAL  4.  Patient will tolerate at least 20 minutes on the bike or treadmill without increased back pain Baseline: Patient has stopped working out due to pain Goal status: INITIAL  PLAN:  PT FREQUENCY: 2x/week  PT DURATION: 12 weeks  PLANNED INTERVENTIONS: Therapeutic exercises, Therapeutic activity,  Neuromuscular re-education, Balance training, Gait training, Patient/Family education, Self Care, Joint mobilization, Stair training, Dry Needling, Cryotherapy, Moist heat, Ionotophoresis '4mg'$ /ml Dexamethasone, and Manual therapy.  PLAN FOR NEXT SESSION: Initiate HEP for stretch and trunk stabilization   Marcelina Morel, DPT 09/20/2022, 2:44 PM

## 2022-09-20 NOTE — Progress Notes (Addendum)
Date of Service/Encounter:  09/20/22  Allergy testing appointment   Initial visit on August 04, 2022, seen for chronic rhinitis.  Please see that note for additional details.  Today reports for allergy diagnostic testing:    DIAGNOSTICS:  Skin Testing: Environmental allergy panel was negative with a good histamine response Adequate positive and negative controls Results discussed with patient/family.  Airborne Adult Perc - 09/20/22 0846     Time Antigen Placed 0846    Allergen Manufacturer Lavella Hammock    Location Back    Number of Test 59    Panel 1 Select    2. Control-Histamine 1 mg/ml 3+    4. Misenheimer Negative    5. [place] Negative    6. Johnson Negative    7. [place] Blue Negative    8. Meadow Fescue Negative    9. Perennial Rye Negative    10. Sweet Vernal Negative    11. Timothy Negative    12. Cocklebur Negative    13. Burweed Marshelder Negative    14. Ragweed, short Negative    15. Ragweed, Giant Negative    16. Plantain,  English Negative    17. Lamb's Quarters Negative    18. Sheep Sorrell Negative    19. Rough Pigweed Negative    20. Marsh Elder, Rough Negative    21. Mugwort, Common Negative    22. Ash mix Negative    23. Birch mix Negative    24. Beech American Negative    25. Box, Elder Negative    26. Cedar, red Negative    27. Cottonwood, [place] Negative    28. Elm mix Negative    29. Hickory Negative    30. Maple mix Negative    31. Oak, [place] mix Negative    32. Pecan Pollen Negative    33. Pine mix Negative    34. Sycamore Eastern Negative    35. [place], Black Pollen Negative    36. Alternaria alternata Negative    37. Cladosporium Herbarum Negative    38. Aspergillus mix Negative    39. Penicillium mix Negative    40. Bipolaris sorokiniana (Helminthosporium) Negative    41. Drechslera spicifera (Curvularia) Negative    42. Mucor plumbeus Negative    43. Fusarium moniliforme Negative    44. Aureobasidium pullulans (pullulara) Negative     45. Rhizopus oryzae Negative    46. Botrytis cinera Negative    47. Epicoccum nigrum Negative    48. Phoma betae Negative    49. Candida Albicans Negative    50. Trichophyton mentagrophytes Negative    51. Mite, D Farinae  5,000 AU/ml Negative    52. Mite, D Pteronyssinus  5,000 AU/ml Negative    53. Cat Hair 10,000 BAU/ml Negative    54.  Dog Epithelia Negative    55. Mixed Feathers Negative    56. Horse Epithelia Negative    57. Cockroach, German Negative    58. Mouse Negative    59. Tobacco Leaf Negative             Intradermal - 09/20/22 0957     Time Antigen Placed 0957    Allergen Manufacturer Lavella Hammock    Location Back    Number of Test 15    Intradermal Select    Control Negative    [place] Negative    Johnson Negative    7 Grass Negative    Ragweed mix Negative    Weed mix Negative    Tree mix Negative  Mold 1 Negative    Mold 2 Negative    Mold 3 Negative    Mold 4 Negative    Cat Negative    Dog Negative    Cockroach Negative    Mite mix Negative    Other Negative               Allergy testing results were read and interpreted by myself, documented by clinical staff.  Patient provided with copy of allergy testing along with avoidance measures when indicated.   Non Allergic rhinitis Skin testing today was negative to environmental allergens with a good histamine response Use Atrovent (ipratropium) 0.03% 1-2 sprays per nostril twice a day as needed for runny nose/drainage.  Follow up in 4 weeks with Dr. Maudie Mercury or sooner if needed.  David Charon, FNP [facility]

## 2022-09-21 ENCOUNTER — Encounter: Payer: Self-pay | Admitting: Family Medicine

## 2022-09-21 DIAGNOSIS — H401122 Primary open-angle glaucoma, left eye, moderate stage: Secondary | ICD-10-CM | POA: Diagnosis not present

## 2022-09-22 ENCOUNTER — Other Ambulatory Visit: Payer: Self-pay | Admitting: Internal Medicine

## 2022-09-23 ENCOUNTER — Ambulatory Visit: Payer: Medicare Other

## 2022-09-23 DIAGNOSIS — R262 Difficulty in walking, not elsewhere classified: Secondary | ICD-10-CM

## 2022-09-23 DIAGNOSIS — R2681 Unsteadiness on feet: Secondary | ICD-10-CM | POA: Diagnosis not present

## 2022-09-23 DIAGNOSIS — R293 Abnormal posture: Secondary | ICD-10-CM | POA: Diagnosis not present

## 2022-09-23 DIAGNOSIS — M4317 Spondylolisthesis, lumbosacral region: Secondary | ICD-10-CM | POA: Diagnosis not present

## 2022-09-23 DIAGNOSIS — R278 Other lack of coordination: Secondary | ICD-10-CM | POA: Diagnosis not present

## 2022-09-23 DIAGNOSIS — M545 Low back pain, unspecified: Secondary | ICD-10-CM | POA: Diagnosis not present

## 2022-09-23 DIAGNOSIS — M47816 Spondylosis without myelopathy or radiculopathy, lumbar region: Secondary | ICD-10-CM | POA: Diagnosis not present

## 2022-09-23 DIAGNOSIS — M6281 Muscle weakness (generalized): Secondary | ICD-10-CM | POA: Diagnosis not present

## 2022-09-23 NOTE — Therapy (Signed)
OUTPATIENT PHYSICAL THERAPY THORACOLUMBAR TREATMENT   Patient Name: Rilen Shukla MRN: [id number] DOB:1941/05/05, 81 y.o., male Today's Date: 09/23/2022   PT End of Session - 09/23/22 1015     Visit Number 2    Date for PT Re-Evaluation 12/13/22    PT Start Time 1015    PT Stop Time 1100    PT Time Calculation (min) 45 min    Activity Tolerance Patient tolerated treatment well    Behavior During Therapy [facility] for tasks assessed/performed              Past Medical History:  Diagnosis Date   ALLERGIC RHINITIS 07/13/2007   Aorta dilated on Echo; Normal sized Aorta on CT    Chest CT 1/23: No thoracic aortic aneurysm; coronary calcifications; aortic atherosclerosis; improved aeration of lungs with persistent minimal reticular opacities   CAD (coronary artery disease)    GERD (gastroesophageal reflux disease)    GI bleed    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) ([place])    HYPERLIPIDEMIA 07/13/2007   HYPERTENSION 07/13/2007   Impaired glucose tolerance 09/27/2016   Iron deficiency anemia due to chronic blood loss 02/27/2021   NEPHROLITHIASIS, 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   NSVT (nonsustained ventricular tachycardia) ([place])    PEPTIC ULCER DISEASE 07/13/2007   PVC's (premature ventricular contractions)    SVT (supraventricular tachycardia)    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   Past Surgical History:  Procedure Laterality Date   BROW LIFT Bilateral 11/10/2020   Procedure: BLEPHAROPLASTY;  Surgeon: [name], MD;  Location: [facility];  Service: Plastics;  Laterality: Bilateral;  1 hour   COLONOSCOPY N/A 04/12/2022   Procedure: COLONOSCOPY;  Surgeon: Arta Silence, MD;  [facility];  Service: Gastroenterology;  Laterality: N/A;   COLONOSCOPY WITH PROPOFOL N/A 02/17/2019   Procedure: COLONOSCOPY WITH PROPOFOL;  Surgeon: Ronnette Juniper, MD;  [facility];  Service: Gastroenterology;  Laterality: N/A;   CORONARY STENT  INTERVENTION N/A 08/25/2021   Procedure: CORONARY STENT INTERVENTION;  Surgeon: Jettie Booze, MD;  Location: [place] [facility];  Service: Cardiovascular;  Laterality: N/A;   HYDROCELE EXCISION Right 05/15/2003   INTRAVASCULAR LITHOTRIPSY  08/25/2021   Procedure: INTRAVASCULAR LITHOTRIPSY;  Surgeon: Jettie Booze, MD;  Location: [place] [facility];  Service: Cardiovascular;;   INTRAVASCULAR ULTRASOUND/IVUS N/A 08/25/2021   Procedure: Intravascular Ultrasound/IVUS;  Surgeon: Jettie Booze, MD;  Location: [place] [facility];  Service: Cardiovascular;  Laterality: N/A;   IR ANGIOGRAM VISCERAL SELECTIVE  02/16/2019   IR ANGIOGRAM VISCERAL SELECTIVE  02/16/2019   IR ANGIOGRAM VISCERAL SELECTIVE  02/16/2019   IR US GUIDE VASC ACCESS RIGHT  02/16/2019   LEFT HEART CATH AND CORONARY ANGIOGRAPHY N/A 07/28/2021   Procedure: LEFT HEART CATH AND CORONARY ANGIOGRAPHY;  Surgeon: Jettie Booze, MD;  Location: [place] [facility];  Service: Cardiovascular;  Laterality: N/A;   LEFT HEART CATH AND CORONARY ANGIOGRAPHY N/A 08/25/2021   Procedure: LEFT HEART CATH AND CORONARY ANGIOGRAPHY;  Surgeon: Jettie Booze, MD;  Location: [place] [facility];  Service: Cardiovascular;  Laterality: N/A;   ROTATOR CUFF REPAIR     SPERMATOCELECTOMY Right 05/15/2003   Patient Active Problem List   Diagnosis Date Noted   Spondylosis of lumbar spine 09/09/2022   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 09/09/2022   Gait instability 09/09/2022   Acute right-sided low back pain without sciatica 09/03/2022   Chronic rhinitis 08/04/2022   Iron deficiency anemia due to chronic blood loss 07/22/2022  Personal history of colonic polyps 07/22/2022   Gastro-esophageal reflux disease with esophagitis 07/22/2022   Gastric polyp 07/22/2022   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   GIB (gastrointestinal bleeding) 04/11/2022   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   Vivid dream 09/21/2021   Excessive daytime  sleepiness 09/21/2021   Snoring 09/21/2021   Coronary artery disease involving native coronary artery of native heart 09/21/2021   SOBOE (shortness of breath on exertion) 09/21/2021   Coronary artery disease    HFrEF (heart failure with reduced ejection fraction) (HCC)    Bell's palsy, left 06/05/2021   Cerebrovascular disease 06/05/2021   Hiatal hernia 05/29/2021   Glaucoma 02/27/2021   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   Erectile dysfunction due to arterial insufficiency 02/27/2021   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 12/29/2020   Hemorrhoids 07/17/2020   Syncope 11/15/2018   Diverticulitis 05/25/2018   Osteoarthritis of right knee 12/13/2017   Osteoarthritis of left knee 12/13/2017   Testosterone deficiency 03/22/2017   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/27/2016   BPH (benign prostatic hyperplasia) 06/18/2013   HLD (hyperlipidemia) 07/13/2007   Essential hypertension 07/13/2007   Allergic rhinitis 07/13/2007   Peptic ulcer disease 07/13/2007   History of nephrolithiasis 07/13/2007    PCP: Haydee Salter, MD   REFERRING PROVIDER: Haydee Salter, MD   REFERRING DIAG: Diagnosis M47.816 (ICD-10-CM) - Spondylosis of lumbar spine M43.17 (ICD-10-CM) - 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 R26.81 (ICD-10-CM) - Gait instability   Rationale for Evaluation and Treatment Rehabilitation  THERAPY DIAG:  Acute bilateral low back pain without sciatica  Unsteadiness on feet  Other lack of coordination  Muscle weakness (generalized)  Difficulty in walking, not elsewhere classified  Abnormal posture  ONSET DATE: 09/09/2022   SUBJECTIVE:                                                                                                                                                                                           SUBJECTIVE STATEMENT: Had some pain last and I took some Tylenol in the morning. Pain is a 7/10.   PERTINENT HISTORY:   Mr. Lave was seen 2 days prior to this with acute lower right back pain. He notes this continues to give him some difficulty. He denies any lower extremity weakness or numbness. He has had his wife massage his right lower back and this does help provide temporary relief. In addition, he notes that he has difficulty walking at times. He wonders if this is because of some of his medicines. He has some mild dizziness when he stands up, but has learned to pause until this resolves.  He finds when he walks he tends to stagger some to one side.  Hiatal Hernia  PAIN:  Are you having pain? Yes: NPRS scale: 7/10 Pain location: low back, R and L, moves Pain description: sharp, goes into hips, but not legs Aggravating factors: Unknown Relieving factors: Naprosin helps, but he is limited in using it due to diverticulitis  PRECAUTIONS: None  WEIGHT BEARING RESTRICTIONS: No  FALLS:  Has patient fallen in last 6 months? Yes. 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   OBJECTIVE:   DIAGNOSTIC FINDINGS:  Lumbar x-ray: Spondylosis of L3-L5 with significant deformity of the L4 vertebrae.There appears to be some spondylolisthesis at the L5-S1 vertebrae.   1. Acute bilateral low back pain without sciatica 2. Spondylosis of lumbar spine 3. 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 Cutrone has significant arthritic changes in the lower lumbar spine. This is likely leading to muscular strain causing his current pain. As he did not tolerate a muscle relaxer. I recommend we focus more on physical therapy to try and get him some relief.   SCREENING FOR RED FLAGS: Bowel or bladder  incontinence: No Spinal tumors: No Cauda equina syndrome: No Compression fracture: No Abdominal aneurysm: No  COGNITION: Overall cognitive status: Within functional limits for tasks assessed    MUSCLE LENGTH: Hamstrings: Right 71 deg; Left 65 deg Thomas test: B hip flexor tightness  POSTURE: rounded shoulders, forward head, decreased lumbar lordosis, and flexed trunk   PALPATION: TTP L lower lumbar paraspinals, Glut medius, R glut medius, with trigger point in the muscle.  LUMBAR ROM:  Lumbar AROM WNL in all planes except B rotation mildly limited B.  LOWER EXTREMITY ROM:    Generally WFL, but mildly stiff in all planes of hip movement.   LOWER EXTREMITY MMT:    MMT Right eval Left eval  Hip flexion 4- 4-  Hip extension 3+ 3+  Hip abduction 4- 3+  Hip adduction    Hip internal rotation    Hip external rotation    Knee flexion 4- 4-  Knee extension 4- 4-  Ankle dorsiflexion 4- 4-  Ankle plantarflexion    Ankle inversion    Ankle eversion     (Blank rows = not tested)  LUMBAR SPECIAL TESTS:  Straight leg raise test: Negative and Slump test: Negative  FUNCTIONAL TESTS:  5 times sit to stand: 14.4 Functional gait assessment: 15  GAIT: Distance walked: in clinic Assistive device utilized: None Level of assistance: Complete Independence Comments: unsteady with lateral LOB to each side, slow.  TODAY'S TREATMENT:  DATE:  09/23/22 Bike L4 x71mns Stretching hamstrings, SK2C, and glutes Feet on pball rotations, knees to chest 2x10 Bridges 2x10 SLR 2x10 Supine single knee fallouts green 2x10 S2S 2x10 Leg ext 10# 2x10 HS curls 25# 2x10 Shoulder ext 10# 2x10    PATIENT EDUCATION:  Education details: POC Person educated: Patient Education method: Explanation Education comprehension: verbalized understanding  HOME EXERCISE  PROGRAM: TBD  ASSESSMENT:  CLINICAL IMPRESSION: Patient enters with 7/10 back in low back. We started with some stretching of LE and low back. Started with light strengthening today, he tolerates session well with some fatigue. Cues needed for form. Good effort throughout session.  OBJECTIVE IMPAIRMENTS: Abnormal gait, decreased activity tolerance, decreased balance, decreased coordination, difficulty walking, decreased ROM, decreased strength, increased muscle spasms, impaired flexibility, and pain.   ACTIVITY LIMITATIONS: carrying, lifting, bending, squatting, and locomotion level  PARTICIPATION LIMITATIONS: meal prep, cleaning, shopping, community activity, and yard work  PERSONAL FACTORS: Age and 1 comorbidity: lumbar spondylolisthesis  are also affecting patient's functional outcome.   REHAB POTENTIAL: Good  CLINICAL DECISION MAKING: Stable/uncomplicated  EVALUATION COMPLEXITY: Low   GOALS: Goals reviewed with patient? Yes  SHORT TERM GOALS: Target date: 10/18/2022  I with basic HEP Baseline: Goal status: INITIAL  LONG TERM GOALS: Target date: 12/13/2022  I with final HEP Baseline:  Goal status: INITIAL  2.  Increase FGA score to at least 24 Baseline: 15 Goal status: INITIAL  3.  Decrease 5X STS to < 12 sec Baseline: 14.4 Goal status: INITIAL  4.  Patient will tolerate at least 20 minutes on the bike or treadmill without increased back pain Baseline: Patient has stopped working out due to pain Goal status: INITIAL  PLAN:  PT FREQUENCY: 2x/week  PT DURATION: 12 weeks  PLANNED INTERVENTIONS: Therapeutic exercises, Therapeutic activity, Neuromuscular re-education, Balance training, Gait training, Patient/Family education, Self Care, Joint mobilization, Stair training, Dry Needling, Cryotherapy, Moist heat, Ionotophoresis '4mg'$ /ml Dexamethasone, and Manual therapy.  PLAN FOR NEXT SESSION: Initiate HEP for stretch and trunk stabilization   SMarcelina Morel  DPT 09/23/2022, 10:57 AM

## 2022-09-27 ENCOUNTER — Encounter: Payer: Self-pay | Admitting: Family Medicine

## 2022-09-27 ENCOUNTER — Other Ambulatory Visit: Payer: Self-pay

## 2022-09-27 DIAGNOSIS — H43811 Vitreous degeneration, right eye: Secondary | ICD-10-CM | POA: Insufficient documentation

## 2022-09-27 DIAGNOSIS — H35039 Hypertensive retinopathy, unspecified eye: Secondary | ICD-10-CM | POA: Insufficient documentation

## 2022-09-27 MED ORDER — TAMSULOSIN HCL 0.4 MG PO CAPS: 0.4000 mg | ORAL_CAPSULE | Freq: Every day | ORAL | 3 refills | Status: DC

## 2022-09-27 NOTE — Telephone Encounter (Signed)
Refill request for  Tamsulosin 0.4 mg LR   10/30/21, #90, 3 rf LOV 09/09/22 FOV  10/26/22  Please review and advise.  Thanks.  Dm/cma

## 2022-09-30 ENCOUNTER — Ambulatory Visit: Payer: Medicare Other | Attending: Family Medicine | Admitting: Physical Therapy

## 2022-09-30 DIAGNOSIS — R2681 Unsteadiness on feet: Secondary | ICD-10-CM | POA: Diagnosis not present

## 2022-09-30 DIAGNOSIS — R293 Abnormal posture: Secondary | ICD-10-CM | POA: Diagnosis not present

## 2022-09-30 DIAGNOSIS — M6281 Muscle weakness (generalized): Secondary | ICD-10-CM | POA: Insufficient documentation

## 2022-09-30 DIAGNOSIS — M545 Low back pain, unspecified: Secondary | ICD-10-CM | POA: Diagnosis not present

## 2022-09-30 DIAGNOSIS — R278 Other lack of coordination: Secondary | ICD-10-CM | POA: Insufficient documentation

## 2022-09-30 DIAGNOSIS — R262 Difficulty in walking, not elsewhere classified: Secondary | ICD-10-CM | POA: Insufficient documentation

## 2022-09-30 NOTE — Therapy (Signed)
OUTPATIENT PHYSICAL THERAPY THORACOLUMBAR TREATMENT   Patient Name: David Goodman MRN: [id number] DOB:07-27-1941, 81 y.o., male Today's Date: 09/30/2022   PT End of Session - 09/30/22 1136     Visit Number 3    Date for PT Re-Evaluation 12/13/22    PT Start Time 1135    PT Stop Time 1220    PT Time Calculation (min) 45 min              Past Medical History:  Diagnosis Date   ALLERGIC RHINITIS 07/13/2007   Aorta dilated on Echo; Normal sized Aorta on CT    Chest CT 1/23: No thoracic aortic aneurysm; coronary calcifications; aortic atherosclerosis; improved aeration of lungs with persistent minimal reticular opacities   CAD (coronary artery disease)    GERD (gastroesophageal reflux disease)    GI bleed    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) ([place])    HYPERLIPIDEMIA 07/13/2007   HYPERTENSION 07/13/2007   Impaired glucose tolerance 09/27/2016   Iron deficiency anemia due to chronic blood loss 02/27/2021   NEPHROLITHIASIS, 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   NSVT (nonsustained ventricular tachycardia) ([place])    PEPTIC ULCER DISEASE 07/13/2007   PVC's (premature ventricular contractions)    SVT (supraventricular tachycardia)    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   Past Surgical History:  Procedure Laterality Date   BROW LIFT Bilateral 11/10/2020   Procedure: BLEPHAROPLASTY;  Surgeon: [name], MD;  Location: [facility];  Service: Plastics;  Laterality: Bilateral;  1 hour   COLONOSCOPY N/A 04/12/2022   Procedure: COLONOSCOPY;  Surgeon: Arta Silence, MD;  [facility];  Service: Gastroenterology;  Laterality: N/A;   COLONOSCOPY WITH PROPOFOL N/A 02/17/2019   Procedure: COLONOSCOPY WITH PROPOFOL;  Surgeon: Ronnette Juniper, MD;  [facility];  Service: Gastroenterology;  Laterality: N/A;   CORONARY STENT INTERVENTION N/A 08/25/2021   Procedure: CORONARY STENT INTERVENTION;  Surgeon: Jettie Booze, MD;  Location: [place]  [facility];  Service: Cardiovascular;  Laterality: N/A;   HYDROCELE EXCISION Right 05/15/2003   INTRAVASCULAR LITHOTRIPSY  08/25/2021   Procedure: INTRAVASCULAR LITHOTRIPSY;  Surgeon: Jettie Booze, MD;  Location: [place] [facility];  Service: Cardiovascular;;   INTRAVASCULAR ULTRASOUND/IVUS N/A 08/25/2021   Procedure: Intravascular Ultrasound/IVUS;  Surgeon: Jettie Booze, MD;  Location: [place] [facility];  Service: Cardiovascular;  Laterality: N/A;   IR ANGIOGRAM VISCERAL SELECTIVE  02/16/2019   IR ANGIOGRAM VISCERAL SELECTIVE  02/16/2019   IR ANGIOGRAM VISCERAL SELECTIVE  02/16/2019   IR US GUIDE VASC ACCESS RIGHT  02/16/2019   LEFT HEART CATH AND CORONARY ANGIOGRAPHY N/A 07/28/2021   Procedure: LEFT HEART CATH AND CORONARY ANGIOGRAPHY;  Surgeon: Jettie Booze, MD;  Location: [place] [facility];  Service: Cardiovascular;  Laterality: N/A;   LEFT HEART CATH AND CORONARY ANGIOGRAPHY N/A 08/25/2021   Procedure: LEFT HEART CATH AND CORONARY ANGIOGRAPHY;  Surgeon: Jettie Booze, MD;  Location: [place] [facility];  Service: Cardiovascular;  Laterality: N/A;   ROTATOR CUFF REPAIR     SPERMATOCELECTOMY Right 05/15/2003   Patient Active Problem List   Diagnosis Date Noted   Hypertensive retinopathy 09/27/2022   Posterior vitreous detachment of right eye 09/27/2022   Spondylosis of lumbar spine 09/09/2022   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 09/09/2022   Gait instability 09/09/2022   Acute right-sided low back pain without sciatica 09/03/2022   Chronic rhinitis 08/04/2022   Iron deficiency anemia due to chronic blood loss 07/22/2022   Personal history of colonic  polyps 07/22/2022   Gastro-esophageal reflux disease with esophagitis 07/22/2022   Gastric polyp 07/22/2022   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   GIB (gastrointestinal bleeding) 04/11/2022   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   Vivid dream 09/21/2021   Excessive daytime sleepiness 09/21/2021   Snoring  09/21/2021   Coronary artery disease involving native coronary artery of native heart 09/21/2021   SOBOE (shortness of breath on exertion) 09/21/2021   Coronary artery disease    HFrEF (heart failure with reduced ejection fraction) (HCC)    Bell's palsy, left 06/05/2021   Cerebrovascular disease 06/05/2021   Hiatal hernia 05/29/2021   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   Erectile dysfunction due to arterial insufficiency 02/27/2021   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 12/29/2020   Hemorrhoids 07/17/2020   Syncope 11/15/2018   Diverticulitis 05/25/2018   Osteoarthritis of right knee 12/13/2017   Osteoarthritis of left knee 12/13/2017   Testosterone deficiency 03/22/2017   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/27/2016   BPH (benign prostatic hyperplasia) 06/18/2013   HLD (hyperlipidemia) 07/13/2007   Essential hypertension 07/13/2007   Allergic rhinitis 07/13/2007   Peptic ulcer disease 07/13/2007   History of nephrolithiasis 07/13/2007    PCP: Haydee Salter, MD   REFERRING PROVIDER: Haydee Salter, MD   REFERRING DIAG: Diagnosis M47.816 (ICD-10-CM) - Spondylosis of lumbar spine M43.17 (ICD-10-CM) - 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 Pain is a 7/10.   PERTINENT HISTORY:  Mr. Staley was seen 2 days prior to this with acute lower right back pain. He notes this continues to give him some difficulty. He  denies any lower extremity weakness or numbness. He has had his wife massage his right lower back and this does help provide temporary relief. In addition, he notes that he has difficulty walking at times. He wonders if this is because of some of his medicines. He has some mild dizziness when he stands up, but has learned to pause until this resolves. He finds when he walks he tends to stagger some to one side.  Hiatal Hernia  PAIN:  Are you having pain? Yes: NPRS  scale: 7/10 Pain location: low back, R and L, moves Pain description: sharp, goes into hips, but not legs Aggravating factors: Unknown Relieving factors: Naprosin helps, but he is limited in using it due to diverticulitis  PRECAUTIONS: None  WEIGHT BEARING RESTRICTIONS: No  FALLS:  Has patient fallen in last 6 months? Yes. 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   OBJECTIVE:   DIAGNOSTIC FINDINGS:  Lumbar x-ray: Spondylosis of L3-L5 with significant deformity of the L4 vertebrae.There appears to be some spondylolisthesis at the L5-S1 vertebrae.   1. Acute bilateral low back pain without sciatica 2. Spondylosis of lumbar spine 3. 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 Mcloud has significant arthritic changes in the lower lumbar spine. This is likely leading to muscular strain causing his current pain. As he did not tolerate a muscle relaxer. I recommend we focus more on physical therapy to try and get him some relief.   SCREENING FOR RED FLAGS: Bowel or bladder incontinence: No Spinal tumors: No Cauda equina syndrome: No Compression fracture: No Abdominal aneurysm:  No  COGNITION: Overall cognitive status: Within functional limits for tasks assessed    MUSCLE LENGTH: Hamstrings: Right 71 deg; Left 65 deg Thomas test: B hip flexor tightness  POSTURE: rounded shoulders, forward head, decreased lumbar lordosis, and flexed trunk   PALPATION: TTP L lower lumbar paraspinals, Glut medius, R glut medius, with trigger point in the muscle.  LUMBAR ROM:  Lumbar AROM WNL in all planes except B rotation mildly limited B.  LOWER EXTREMITY ROM:    Generally WFL, but mildly stiff in all planes of hip movement.   LOWER EXTREMITY MMT:    MMT Right eval Left eval  Hip flexion 4- 4-  Hip extension 3+ 3+  Hip abduction 4- 3+  Hip adduction    Hip internal rotation    Hip external rotation    Knee flexion 4- 4-  Knee extension 4- 4-  Ankle dorsiflexion 4- 4-  Ankle plantarflexion    Ankle inversion    Ankle eversion     (Blank rows = not tested)  LUMBAR SPECIAL TESTS:  Straight leg raise test: Negative and Slump test: Negative  FUNCTIONAL TESTS:  5 times sit to stand: 14.4 Functional gait assessment: 15  GAIT: Distance walked: in clinic Assistive device utilized: None Level of assistance: Complete Independence Comments: unsteady with lateral LOB to each side, slow.  TODAY'S TREATMENT:                                                                                                                              DATE:   09/30/22  Nustep  L 4 6 min Black tband trunk flex and ext 2 sets 10 Seated row and lat pull down 20# 2 sets 10 Supine core stab ex with cuing for core engagement, control and speed 15 min PROM and stretching LE and trunk=LE tightness BIL DN to lumb paraspinals BIL by Michele Mcalpine PT     09/23/22 Bike L4 x24mns Stretching hamstrings, SK2C, and glutes Feet on pball rotations, knees to chest 2x10 Bridges 2x10 SLR 2x10 Supine single knee fallouts green 2x10 S2S 2x10 Leg ext 10# 2x10 HS curls 25# 2x10 Shoulder ext 10#  2x10    PATIENT EDUCATION:  Education details: POC Person educated: Patient Education method: Explanation Education comprehension: verbalized understanding  HOME EXERCISE PROGRAM: TBD  ASSESSMENT:  CLINICAL IMPRESSION: STG met- pt verb doing ex and stretching. Pt states as of now no changes in pain and pain moves side to side. Progressed core strength with cuing. PROM/stretches LE OBJECTIVE IMPAIRMENTS: Abnormal gait, decreased activity tolerance, decreased balance, decreased coordination, difficulty walking, decreased ROM, decreased strength, increased muscle spasms, impaired flexibility, and pain.   ACTIVITY LIMITATIONS: carrying, lifting, bending, squatting, and locomotion level  PARTICIPATION LIMITATIONS: meal prep, cleaning, shopping, community activity, and yard work  PERSONAL FACTORS: Age and 1 comorbidity: lumbar spondylolisthesis  are also affecting patient's functional outcome.   REHAB POTENTIAL: Good  CLINICAL DECISION MAKING: Stable/uncomplicated  EVALUATION COMPLEXITY: Low   GOALS: Goals reviewed with patient? Yes  SHORT TERM GOALS: Target date: 10/18/2022  I with basic HEP Baseline: Goal status: 09/30/22 MET  LONG TERM GOALS: Target date: 12/13/2022  I with final HEP Baseline:  Goal status: INITIAL  2.  Increase FGA score to at least 24 Baseline: 15 Goal status: INITIAL  3.  Decrease 5X STS to < 12 sec Baseline: 14.4 Goal status: INITIAL  4.  Patient will tolerate at least 20 minutes on the bike or treadmill without increased back pain Baseline: Patient has stopped working out due to pain Goal status: INITIAL  PLAN:  PT FREQUENCY: 2x/week  PT DURATION: 12 weeks  PLANNED INTERVENTIONS: Therapeutic exercises, Therapeutic activity, Neuromuscular re-education, Balance training, Gait training, Patient/Family education, Self Care, Joint mobilization, Stair training, Dry Needling, Cryotherapy, Moist heat, Ionotophoresis 421mml Dexamethasone, and  Manual therapy.  PLAN FOR NEXT SESSION: assess effects of DN and progress core and hip stab and stretching   APayseur PTA 09/30/2022, 11:39 AM [id number]: [phone] Fax:  [phone] Details  Name: JeCaeson FilippiRN: [id number] of Birth: 1102/17/1942eferring Provider:  RuHaydee SalterMD  Encounter Date: 09/30/2022   PALaqueta CarinaPTA 09/30/2022, 11:39 AM  [id number]: [phone] Fax:  [phone]

## 2022-10-04 ENCOUNTER — Ambulatory Visit: Payer: Medicare Other | Admitting: Physical Therapy

## 2022-10-04 ENCOUNTER — Encounter: Payer: Self-pay | Admitting: Physical Therapy

## 2022-10-04 DIAGNOSIS — R278 Other lack of coordination: Secondary | ICD-10-CM | POA: Diagnosis not present

## 2022-10-04 DIAGNOSIS — R293 Abnormal posture: Secondary | ICD-10-CM

## 2022-10-04 DIAGNOSIS — R2681 Unsteadiness on feet: Secondary | ICD-10-CM

## 2022-10-04 DIAGNOSIS — M545 Low back pain, unspecified: Secondary | ICD-10-CM

## 2022-10-04 DIAGNOSIS — R262 Difficulty in walking, not elsewhere classified: Secondary | ICD-10-CM | POA: Diagnosis not present

## 2022-10-04 DIAGNOSIS — M6281 Muscle weakness (generalized): Secondary | ICD-10-CM | POA: Diagnosis not present

## 2022-10-04 NOTE — Therapy (Signed)
OUTPATIENT PHYSICAL THERAPY THORACOLUMBAR TREATMENT   Patient Name: David Goodman MRN: [id number] DOB:02-21-1941, 81 y.o., male Today's Date: 10/04/2022   PT End of Session - 10/04/22 1133     Visit Number 4    Date for PT Re-Evaluation 12/13/22    PT Start Time 1135    PT Stop Time 1220    PT Time Calculation (min) 45 min    Activity Tolerance Patient tolerated treatment well    Behavior During Therapy [facility] for tasks assessed/performed              Past Medical History:  Diagnosis Date   ALLERGIC RHINITIS 07/13/2007   Aorta dilated on Echo; Normal sized Aorta on CT    Chest CT 1/23: No thoracic aortic aneurysm; coronary calcifications; aortic atherosclerosis; improved aeration of lungs with persistent minimal reticular opacities   CAD (coronary artery disease)    GERD (gastroesophageal reflux disease)    GI bleed    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) ([place])    HYPERLIPIDEMIA 07/13/2007   HYPERTENSION 07/13/2007   Impaired glucose tolerance 09/27/2016   Iron deficiency anemia due to chronic blood loss 02/27/2021   NEPHROLITHIASIS, 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   NSVT (nonsustained ventricular tachycardia) ([place])    PEPTIC ULCER DISEASE 07/13/2007   PVC's (premature ventricular contractions)    SVT (supraventricular tachycardia)    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   Past Surgical History:  Procedure Laterality Date   BROW LIFT Bilateral 11/10/2020   Procedure: BLEPHAROPLASTY;  Surgeon: [name], MD;  Location: [place];  Service: Plastics;  Laterality: Bilateral;  1 hour   COLONOSCOPY N/A 04/12/2022   Procedure: COLONOSCOPY;  Surgeon: Arta Silence, MD;  Location: [facility];  Service: Gastroenterology;  Laterality: N/A;   COLONOSCOPY WITH PROPOFOL N/A 02/17/2019   Procedure: COLONOSCOPY WITH PROPOFOL;  Surgeon: Ronnette Juniper, MD;  Location: [facility];  Service: Gastroenterology;  Laterality: N/A;   CORONARY STENT  INTERVENTION N/A 08/25/2021   Procedure: CORONARY STENT INTERVENTION;  Surgeon: Jettie Booze, MD;  Location: [place] [facility];  Service: Cardiovascular;  Laterality: N/A;   HYDROCELE EXCISION Right 05/15/2003   INTRAVASCULAR LITHOTRIPSY  08/25/2021   Procedure: INTRAVASCULAR LITHOTRIPSY;  Surgeon: Jettie Booze, MD;  Location: [place] [facility];  Service: Cardiovascular;;   INTRAVASCULAR ULTRASOUND/IVUS N/A 08/25/2021   Procedure: Intravascular Ultrasound/IVUS;  Surgeon: Jettie Booze, MD;  Location: [place] [facility];  Service: Cardiovascular;  Laterality: N/A;   IR ANGIOGRAM VISCERAL SELECTIVE  02/16/2019   IR ANGIOGRAM VISCERAL SELECTIVE  02/16/2019   IR ANGIOGRAM VISCERAL SELECTIVE  02/16/2019   IR US GUIDE VASC ACCESS RIGHT  02/16/2019   LEFT HEART CATH AND CORONARY ANGIOGRAPHY N/A 07/28/2021   Procedure: LEFT HEART CATH AND CORONARY ANGIOGRAPHY;  Surgeon: Jettie Booze, MD;  Location: [place] [facility];  Service: Cardiovascular;  Laterality: N/A;   LEFT HEART CATH AND CORONARY ANGIOGRAPHY N/A 08/25/2021   Procedure: LEFT HEART CATH AND CORONARY ANGIOGRAPHY;  Surgeon: Jettie Booze, MD;  Location: [place] [facility];  Service: Cardiovascular;  Laterality: N/A;   ROTATOR CUFF REPAIR     SPERMATOCELECTOMY Right 05/15/2003   Patient Active Problem List   Diagnosis Date Noted   Hypertensive retinopathy 09/27/2022   Posterior vitreous detachment of right eye 09/27/2022   Spondylosis of lumbar spine 09/09/2022   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 09/09/2022   Gait instability 09/09/2022   Acute right-sided low back pain without sciatica 09/03/2022   Chronic  rhinitis 08/04/2022   Iron deficiency anemia due to chronic blood loss 07/22/2022   Personal history of colonic polyps 07/22/2022   Gastro-esophageal reflux disease with esophagitis 07/22/2022   Gastric polyp 07/22/2022   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   GIB (gastrointestinal bleeding)  04/11/2022   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   Vivid dream 09/21/2021   Excessive daytime sleepiness 09/21/2021   Snoring 09/21/2021   Coronary artery disease involving native coronary artery of native heart 09/21/2021   SOBOE (shortness of breath on exertion) 09/21/2021   Coronary artery disease    HFrEF (heart failure with reduced ejection fraction) (HCC)    Bell's palsy, left 06/05/2021   Cerebrovascular disease 06/05/2021   Hiatal hernia 05/29/2021   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   Erectile dysfunction due to arterial insufficiency 02/27/2021   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 12/29/2020   Hemorrhoids 07/17/2020   Syncope 11/15/2018   Diverticulitis 05/25/2018   Osteoarthritis of right knee 12/13/2017   Osteoarthritis of left knee 12/13/2017   Testosterone deficiency 03/22/2017   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/27/2016   BPH (benign prostatic hyperplasia) 06/18/2013   HLD (hyperlipidemia) 07/13/2007   Essential hypertension 07/13/2007   Allergic rhinitis 07/13/2007   Peptic ulcer disease 07/13/2007   History of nephrolithiasis 07/13/2007    PCP: Haydee Salter, MD   REFERRING PROVIDER: Haydee Salter, MD   REFERRING DIAG: Diagnosis M47.816 (ICD-10-CM) - Spondylosis of lumbar spine M43.17 (ICD-10-CM) - 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 R26.81 (ICD-10-CM) - Gait instability   Rationale for Evaluation and Treatment Rehabilitation  THERAPY DIAG:  Acute bilateral low back pain without sciatica  Muscle weakness (generalized)  Difficulty in walking, not elsewhere classified  Unsteadiness on feet  Abnormal posture  ONSET DATE: 09/09/2022   SUBJECTIVE:                                                                                                                                                                                           SUBJECTIVE STATEMENT: feeling fine now,  some pain on the R side, pain was 8/10 this morning but it went away after walking around. DN helped the L side  PERTINENT HISTORY:  Mr. Credit was seen 2 days prior to this with acute lower right back pain. He notes this continues to give him some difficulty. He denies any lower extremity weakness or numbness. He has had his wife massage his right lower back and this does help provide temporary relief. In addition, he notes that he has difficulty walking at times. He wonders if this is because of some  of his medicines. He has some mild dizziness when he stands up, but has learned to pause until this resolves. He finds when he walks he tends to stagger some to one side.  Hiatal Hernia  PAIN:  Are you having pain? Yes: NPRS scale: 1/10 Pain location: low back, R and L, moves Pain description: sharp, goes into hips, but not legs Aggravating factors: Unknown Relieving factors: Naprosin helps, but he is limited in using it due to diverticulitis  PRECAUTIONS: None  WEIGHT BEARING RESTRICTIONS: No  FALLS:  Has patient fallen in last 6 months? Yes. 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   OBJECTIVE:   DIAGNOSTIC FINDINGS:  Lumbar x-ray: Spondylosis of L3-L5 with significant deformity of the L4 vertebrae.There appears to be some spondylolisthesis at the L5-S1 vertebrae.   1. Acute bilateral low back pain without sciatica 2. Spondylosis of lumbar spine 3. 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 Levesque has significant arthritic changes in the lower lumbar spine. This is likely leading to muscular strain causing his current pain. As he did not tolerate a muscle  relaxer. I recommend we focus more on physical therapy to try and get him some relief.   SCREENING FOR RED FLAGS: Bowel or bladder incontinence: No Spinal tumors: No Cauda equina syndrome: No Compression fracture: No Abdominal aneurysm: No  COGNITION: Overall cognitive status: Within functional limits for tasks assessed    MUSCLE LENGTH: Hamstrings: Right 71 deg; Left 65 deg Thomas test: B hip flexor tightness  POSTURE: rounded shoulders, forward head, decreased lumbar lordosis, and flexed trunk   PALPATION: TTP L lower lumbar paraspinals, Glut medius, R glut medius, with trigger point in the muscle.  LUMBAR ROM:  Lumbar AROM WNL in all planes except B rotation mildly limited B.  LOWER EXTREMITY ROM:    Generally WFL, but mildly stiff in all planes of hip movement.   LOWER EXTREMITY MMT:    MMT Right eval Left eval  Hip flexion 4- 4-  Hip extension 3+ 3+  Hip abduction 4- 3+  Hip adduction    Hip internal rotation    Hip external rotation    Knee flexion 4- 4-  Knee extension 4- 4-  Ankle dorsiflexion 4- 4-  Ankle plantarflexion    Ankle inversion    Ankle eversion     (Blank rows = not tested)  LUMBAR SPECIAL TESTS:  Straight leg raise test: Negative and Slump test: Negative  FUNCTIONAL TESTS:  5 times sit to stand: 14.4 Functional gait assessment: 15  GAIT: Distance walked: in clinic Assistive device utilized: None Level of assistance: Complete Independence Comments: unsteady with lateral LOB to each side, slow.  TODAY'S TREATMENT:  DATE:   10/04/22 NuStep L5 x 6 min DN to lumb paraspinals BIL by Michele Mcalpine PT Supine bridges Supine Hs stretch with band 3x10'' LE stretches Single K2C, Double K2C, Piriformis,  Standing shoulder Ext green, Rows blue 2x10 Alt 6in box taps    09/30/22 Nustep L 4 6 min Black tband trunk flex  and ext 2 sets 10 Seated row and lat pull down 20# 2 sets 10 Supine core stab ex with cuing for core engagement, control and speed 15 min PROM and stretching LE and trunk=LE tightness BIL DN to lumb paraspinals BIL by Michele Mcalpine PT   09/23/22 Bike L4 x64mns Stretching hamstrings, SK2C, and glutes Feet on pball rotations, knees to chest 2x10 Bridges 2x10 SLR 2x10 Supine single knee fallouts green 2x10 S2S 2x10 Leg ext 10# 2x10 HS curls 25# 2x10 Shoulder ext 10# 2x10    PATIENT EDUCATION:  Education details: POC Person educated: Patient Education method: Explanation Education comprehension: verbalized understanding  HOME EXERCISE PROGRAM: Access Code: [email or web address] URL: [email or web address] Date: 10/04/2022 Prepared by: RCheri Fowler Exercises - Supine Bridge  - 1 x daily - 7 x weekly - 3 sets - 10 reps - Supine Hamstring Stretch with Strap  - 1 x daily - 7 x weekly - 3 sets - 10 reps - Standing Shoulder Extension with Resistance  - 1 x daily - 7 x weekly - 3 sets - 10 reps  ASSESSMENT:  CLINICAL IMPRESSION: Pt enters reporting improvement form DN. Establish HEP and went over all interventions. Some instability present with alt box taps. Constant cue needed to correct UE motion with standing rows. Positive response to single K2S stretch.  OBJECTIVE IMPAIRMENTS: Abnormal gait, decreased activity tolerance, decreased balance, decreased coordination, difficulty walking, decreased ROM, decreased strength, increased muscle spasms, impaired flexibility, and pain.   ACTIVITY LIMITATIONS: carrying, lifting, bending, squatting, and locomotion level  PARTICIPATION LIMITATIONS: meal prep, cleaning, shopping, community activity, and yard work  PERSONAL FACTORS: Age and 1 comorbidity: lumbar spondylolisthesis  are also affecting patient's functional outcome.   REHAB POTENTIAL: Good  CLINICAL DECISION MAKING: Stable/uncomplicated  EVALUATION COMPLEXITY:  Low   GOALS: Goals reviewed with patient? Yes  SHORT TERM GOALS: Target date: 10/18/2022  I with basic HEP Baseline: Goal status: 09/30/22 MET  LONG TERM GOALS: Target date: 12/13/2022  I with final HEP Baseline:  Goal status: INITIAL  2.  Increase FGA score to at least 24 Baseline: 15 Goal status: INITIAL  3.  Decrease 5X STS to < 12 sec Baseline: 14.4 Goal status: INITIAL  4.  Patient will tolerate at least 20 minutes on the bike or treadmill without increased back pain Baseline: Patient has stopped working out due to pain Goal status: INITIAL  PLAN:  PT FREQUENCY: 2x/week  PT DURATION: 12 weeks  PLANNED INTERVENTIONS: Therapeutic exercises, Therapeutic activity, Neuromuscular re-education, Balance training, Gait training, Patient/Family education, Self Care, Joint mobilization, Stair training, Dry Needling, Cryotherapy, Moist heat, Ionotophoresis 448mml Dexamethasone, and Manual therapy.  PLAN FOR NEXT SESSION: assess effects of DN and progress core and hip stab and stretching   APayseur PTA 10/04/2022, 11:34 AM [id number]: [phone] Fax:  [phone] Details  Name: JeJerrelle MichelsenRN: [id number] of Birth: 1105-18-1942eferring Provider:  RuHaydee SalterMD  Encounter Date: 10/04/2022   RoScot JunPTA 10/04/2022, 11:34 AM  CoShadelands [facility] [email or web address]  [address] [place], [place], [address] Phone: [phone]   Fax:  [phone]

## 2022-10-06 ENCOUNTER — Ambulatory Visit: Payer: Medicare Other | Admitting: Physical Therapy

## 2022-10-06 ENCOUNTER — Encounter: Payer: Self-pay | Admitting: Physical Therapy

## 2022-10-06 DIAGNOSIS — R293 Abnormal posture: Secondary | ICD-10-CM | POA: Diagnosis not present

## 2022-10-06 DIAGNOSIS — M545 Low back pain, unspecified: Secondary | ICD-10-CM | POA: Diagnosis not present

## 2022-10-06 DIAGNOSIS — R278 Other lack of coordination: Secondary | ICD-10-CM

## 2022-10-06 DIAGNOSIS — R262 Difficulty in walking, not elsewhere classified: Secondary | ICD-10-CM

## 2022-10-06 DIAGNOSIS — M6281 Muscle weakness (generalized): Secondary | ICD-10-CM

## 2022-10-06 DIAGNOSIS — R2681 Unsteadiness on feet: Secondary | ICD-10-CM | POA: Diagnosis not present

## 2022-10-06 NOTE — Therapy (Signed)
OUTPATIENT PHYSICAL THERAPY THORACOLUMBAR TREATMENT   Patient Name: Justun Anaya MRN: [id number] DOB:03-Jul-1941, 81 y.o., male Today's Date: 10/06/2022   PT End of Session - 10/06/22 1106     Visit Number 5    Date for PT Re-Evaluation 12/13/22    PT Start Time 1100    PT Stop Time 1140    PT Time Calculation (min) 40 min    Activity Tolerance Patient tolerated treatment well    Behavior During Therapy [facility] for tasks assessed/performed               Past Medical History:  Diagnosis Date   ALLERGIC RHINITIS 07/13/2007   Aorta dilated on Echo; Normal sized Aorta on CT    Chest CT 1/23: No thoracic aortic aneurysm; coronary calcifications; aortic atherosclerosis; improved aeration of lungs with persistent minimal reticular opacities   CAD (coronary artery disease)    GERD (gastroesophageal reflux disease)    GI bleed    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) ([place])    HYPERLIPIDEMIA 07/13/2007   HYPERTENSION 07/13/2007   Impaired glucose tolerance 09/27/2016   Iron deficiency anemia due to chronic blood loss 02/27/2021   NEPHROLITHIASIS, 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   NSVT (nonsustained ventricular tachycardia) ([place])    PEPTIC ULCER DISEASE 07/13/2007   PVC's (premature ventricular contractions)    SVT (supraventricular tachycardia)    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   Past Surgical History:  Procedure Laterality Date   BROW LIFT Bilateral 11/10/2020   Procedure: BLEPHAROPLASTY;  Surgeon: [name], MD;  Location: [place];  Service: Plastics;  Laterality: Bilateral;  1 hour   COLONOSCOPY N/A 04/12/2022   Procedure: COLONOSCOPY;  Surgeon: Arta Silence, MD;  Location: [facility];  Service: Gastroenterology;  Laterality: N/A;   COLONOSCOPY WITH PROPOFOL N/A 02/17/2019   Procedure: COLONOSCOPY WITH PROPOFOL;  Surgeon: Ronnette Juniper, MD;  Location: [facility];  Service: Gastroenterology;  Laterality: N/A;   CORONARY STENT  INTERVENTION N/A 08/25/2021   Procedure: CORONARY STENT INTERVENTION;  Surgeon: Jettie Booze, MD;  Location: [place] [facility];  Service: Cardiovascular;  Laterality: N/A;   HYDROCELE EXCISION Right 05/15/2003   INTRAVASCULAR LITHOTRIPSY  08/25/2021   Procedure: INTRAVASCULAR LITHOTRIPSY;  Surgeon: Jettie Booze, MD;  Location: [place] [facility];  Service: Cardiovascular;;   INTRAVASCULAR ULTRASOUND/IVUS N/A 08/25/2021   Procedure: Intravascular Ultrasound/IVUS;  Surgeon: Jettie Booze, MD;  Location: [place] [facility];  Service: Cardiovascular;  Laterality: N/A;   IR ANGIOGRAM VISCERAL SELECTIVE  02/16/2019   IR ANGIOGRAM VISCERAL SELECTIVE  02/16/2019   IR ANGIOGRAM VISCERAL SELECTIVE  02/16/2019   IR US GUIDE VASC ACCESS RIGHT  02/16/2019   LEFT HEART CATH AND CORONARY ANGIOGRAPHY N/A 07/28/2021   Procedure: LEFT HEART CATH AND CORONARY ANGIOGRAPHY;  Surgeon: Jettie Booze, MD;  Location: [place] [facility];  Service: Cardiovascular;  Laterality: N/A;   LEFT HEART CATH AND CORONARY ANGIOGRAPHY N/A 08/25/2021   Procedure: LEFT HEART CATH AND CORONARY ANGIOGRAPHY;  Surgeon: Jettie Booze, MD;  Location: [place] [facility];  Service: Cardiovascular;  Laterality: N/A;   ROTATOR CUFF REPAIR     SPERMATOCELECTOMY Right 05/15/2003   Patient Active Problem List   Diagnosis Date Noted   Hypertensive retinopathy 09/27/2022   Posterior vitreous detachment of right eye 09/27/2022   Spondylosis of lumbar spine 09/09/2022   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 09/09/2022   Gait instability 09/09/2022   Acute right-sided low back pain without sciatica 09/03/2022  Chronic rhinitis 08/04/2022   Iron deficiency anemia due to chronic blood loss 07/22/2022   Personal history of colonic polyps 07/22/2022   Gastro-esophageal reflux disease with esophagitis 07/22/2022   Gastric polyp 07/22/2022   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   GIB (gastrointestinal bleeding)  04/11/2022   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   Vivid dream 09/21/2021   Excessive daytime sleepiness 09/21/2021   Snoring 09/21/2021   Coronary artery disease involving native coronary artery of native heart 09/21/2021   SOBOE (shortness of breath on exertion) 09/21/2021   Coronary artery disease    HFrEF (heart failure with reduced ejection fraction) (HCC)    Bell's palsy, left 06/05/2021   Cerebrovascular disease 06/05/2021   Hiatal hernia 05/29/2021   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   Erectile dysfunction due to arterial insufficiency 02/27/2021   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 12/29/2020   Hemorrhoids 07/17/2020   Syncope 11/15/2018   Diverticulitis 05/25/2018   Osteoarthritis of right knee 12/13/2017   Osteoarthritis of left knee 12/13/2017   Testosterone deficiency 03/22/2017   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/27/2016   BPH (benign prostatic hyperplasia) 06/18/2013   HLD (hyperlipidemia) 07/13/2007   Essential hypertension 07/13/2007   Allergic rhinitis 07/13/2007   Peptic ulcer disease 07/13/2007   History of nephrolithiasis 07/13/2007    PCP: Haydee Salter, MD   REFERRING PROVIDER: Haydee Salter, MD   REFERRING DIAG: Diagnosis M47.816 (ICD-10-CM) - Spondylosis of lumbar spine M43.17 (ICD-10-CM) - 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 R26.81 (ICD-10-CM) - Gait instability   Rationale for Evaluation and Treatment Rehabilitation  THERAPY DIAG:  Acute bilateral low back pain without sciatica  Muscle weakness (generalized)  Difficulty in walking, not elsewhere classified  Unsteadiness on feet  Abnormal posture  Other lack of coordination  ONSET DATE: 09/09/2022   SUBJECTIVE:                                                                                                                                                                                           SUBJECTIVE  STATEMENT: feeling fine now, some pain on the R side, pain was 8/10 this morning but it went away after walking around. DN helped the L side  PERTINENT HISTORY:  Mr. Cardarelli was seen 2 days prior to this with acute lower right back pain. He notes this continues to give him some difficulty. He denies any lower extremity weakness or numbness. He has had his wife massage his right lower back and this does help provide temporary relief. In addition, he notes that he has difficulty walking at times. He wonders  if this is because of some of his medicines. He has some mild dizziness when he stands up, but has learned to pause until this resolves. He finds when he walks he tends to stagger some to one side.  Hiatal Hernia  PAIN:  Are you having pain? Yes: NPRS scale: 1/10 Pain location: low back, R and L, moves Pain description: sharp, goes into hips, but not legs Aggravating factors: Unknown Relieving factors: Naprosin helps, but he is limited in using it due to diverticulitis  PRECAUTIONS: None  WEIGHT BEARING RESTRICTIONS: No  FALLS:  Has patient fallen in last 6 months? Yes. 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   OBJECTIVE:   DIAGNOSTIC FINDINGS:  Lumbar x-ray: Spondylosis of L3-L5 with significant deformity of the L4 vertebrae.There appears to be some spondylolisthesis at the L5-S1 vertebrae.   1. Acute bilateral low back pain without sciatica 2. Spondylosis of lumbar spine 3. 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 Dungan has significant arthritic changes in the lower lumbar spine. This is likely leading to muscular strain causing his current pain. As he  did not tolerate a muscle relaxer. I recommend we focus more on physical therapy to try and get him some relief.   SCREENING FOR RED FLAGS: Bowel or bladder incontinence: No Spinal tumors: No Cauda equina syndrome: No Compression fracture: No Abdominal aneurysm: No  COGNITION: Overall cognitive status: Within functional limits for tasks assessed    MUSCLE LENGTH: Hamstrings: Right 71 deg; Left 65 deg Thomas test: B hip flexor tightness  POSTURE: rounded shoulders, forward head, decreased lumbar lordosis, and flexed trunk   PALPATION: TTP L lower lumbar paraspinals, Glut medius, R glut medius, with trigger point in the muscle.  LUMBAR ROM:  Lumbar AROM WNL in all planes except B rotation mildly limited B.  LOWER EXTREMITY ROM:    Generally WFL, but mildly stiff in all planes of hip movement.   LOWER EXTREMITY MMT:    MMT Right eval Left eval  Hip flexion 4- 4-  Hip extension 3+ 3+  Hip abduction 4- 3+  Hip adduction    Hip internal rotation    Hip external rotation    Knee flexion 4- 4-  Knee extension 4- 4-  Ankle dorsiflexion 4- 4-  Ankle plantarflexion    Ankle inversion    Ankle eversion     (Blank rows = not tested)  LUMBAR SPECIAL TESTS:  Straight leg raise test: Negative and Slump test: Negative  FUNCTIONAL TESTS:  5 times sit to stand: 14.4 Functional gait assessment: 15  GAIT: Distance walked: in clinic Assistive device utilized: None Level of assistance: Complete Independence Comments: unsteady with lateral LOB to each side, slow.  TODAY'S TREATMENT:  DATE:  10/06/22 Nustep L5 x6 min Hooklying trunk rotation x10 reps Sidelying hip abduction x10 reps Supine bridges 2x10 Seated pallof press yellow TB 2x10 reps Standing shoulder ext green TB 2x10 reps Lt SKTC 5x10 sec hold  Standing hip abd hold, extension hold 3x5 sec  each side  Manual: trigger point release Lt glute med, piriformis   10/04/22 NuStep L5 x 6 min DN to lumb paraspinals BIL by Michele Mcalpine PT Supine bridges Supine Hs stretch with band 3x10'' LE stretches Single K2C, Double K2C, Piriformis,  Standing shoulder Ext green, Rows blue 2x10 Alt 6in box taps    09/30/22 Nustep L 4 6 min Black tband trunk flex and ext 2 sets 10 Seated row and lat pull down 20# 2 sets 10 Supine core stab ex with cuing for core engagement, control and speed 15 min PROM and stretching LE and trunk=LE tightness BIL DN to lumb paraspinals BIL by Michele Mcalpine PT   09/23/22 Bike L4 x9mns Stretching hamstrings, SK2C, and glutes Feet on pball rotations, knees to chest 2x10 Bridges 2x10 SLR 2x10 Supine single knee fallouts green 2x10 S2S 2x10 Leg ext 10# 2x10 HS curls 25# 2x10 Shoulder ext 10# 2x10    PATIENT EDUCATION:  Education details: POC Person educated: Patient Education method: Explanation Education comprehension: verbalized understanding  HOME EXERCISE PROGRAM: Access Code: [email or web address] URL: [email or web address] Date: 10/04/2022 Prepared by: RCheri Fowler Exercises - Supine Bridge  - 1 x daily - 7 x weekly - 3 sets - 10 reps - Supine Hamstring Stretch with Strap  - 1 x daily - 7 x weekly - 3 sets - 10 reps - Standing Shoulder Extension with Resistance  - 1 x daily - 7 x weekly - 3 sets - 10 reps  ASSESSMENT:  CLINICAL IMPRESSION: Today focused on therex to increase pts trunk and hip strength. Pt has not been working on his band HEP, but he was able to demo proper technique and understanding of this exercise during the session. Pt has significant glute weakness, and required PT verbal and tactile cues to improve his form with sidelying hip abduction. PT completed manual trigger point release to Lt glutes end of session. Overall, no increase in pain was reported.   OBJECTIVE IMPAIRMENTS: Abnormal gait, decreased activity  tolerance, decreased balance, decreased coordination, difficulty walking, decreased ROM, decreased strength, increased muscle spasms, impaired flexibility, and pain.   ACTIVITY LIMITATIONS: carrying, lifting, bending, squatting, and locomotion level  PARTICIPATION LIMITATIONS: meal prep, cleaning, shopping, community activity, and yard work  PERSONAL FACTORS: Age and 1 comorbidity: lumbar spondylolisthesis  are also affecting patient's functional outcome.   REHAB POTENTIAL: Good  CLINICAL DECISION MAKING: Stable/uncomplicated  EVALUATION COMPLEXITY: Low   GOALS: Goals reviewed with patient? Yes  SHORT TERM GOALS: Target date: 10/18/2022  I with basic HEP Baseline: Goal status: 09/30/22 MET  LONG TERM GOALS: Target date: 12/13/2022  I with final HEP Baseline:  Goal status: INITIAL  2.  Increase FGA score to at least 24 Baseline: 15 Goal status: INITIAL  3.  Decrease 5X STS to < 12 sec Baseline: 14.4 Goal status: INITIAL  4.  Patient will tolerate at least 20 minutes on the bike or treadmill without increased back pain Baseline: Patient has stopped working out due to pain Goal status: INITIAL  PLAN:  PT FREQUENCY: 2x/week  PT DURATION: 12 weeks  PLANNED INTERVENTIONS: Therapeutic exercises, Therapeutic activity, Neuromuscular re-education, Balance training, Gait training, Patient/Family education, Self Care, Joint mobilization, Stair training, Dry Needling, Cryotherapy,  Moist heat, Ionotophoresis 35m/ml Dexamethasone, and Manual therapy.  PLAN FOR NEXT SESSION: more DN to Lt glutes/paraspinals; progress core and hip stab and stretching    Patient Details  Name: JXadrian CraigheadMRN: [id number] of Birth: 112-06-1942Referring Provider:  RHaydee Salter MD  Encounter Date: 10/06/2022  11:42 AM,10/06/22 SSherol DadePT, DWorld Golf Villageat BFroid  [place] [address]Phone: [phone]  Fax:  [phone]

## 2022-10-08 ENCOUNTER — Telehealth: Payer: Self-pay

## 2022-10-08 NOTE — Telephone Encounter (Signed)
   Reason for call: ED-Follow up call   Patient visit on 09/05/2022 at [facility] was for Chest Pain  Have you been able to follow up with your primary care physician? - Yes  The patient was or was not able to obtain any needed medicine or equipment. - Was, Yes  Are there diet recommendations that you are having difficulty following? - No  Patient expresses understanding of discharge instructions and education provided has no other needs at this time.    [facility]  [place], [place]  Main Phone: [phone]  E-mail: [email or web address]  Website: [email or web address]

## 2022-10-11 ENCOUNTER — Ambulatory Visit: Payer: Medicare Other | Admitting: Physical Therapy

## 2022-10-11 DIAGNOSIS — M545 Low back pain, unspecified: Secondary | ICD-10-CM | POA: Diagnosis not present

## 2022-10-11 DIAGNOSIS — R262 Difficulty in walking, not elsewhere classified: Secondary | ICD-10-CM | POA: Diagnosis not present

## 2022-10-11 DIAGNOSIS — R293 Abnormal posture: Secondary | ICD-10-CM

## 2022-10-11 DIAGNOSIS — M6281 Muscle weakness (generalized): Secondary | ICD-10-CM | POA: Diagnosis not present

## 2022-10-11 DIAGNOSIS — R278 Other lack of coordination: Secondary | ICD-10-CM | POA: Diagnosis not present

## 2022-10-11 DIAGNOSIS — R2681 Unsteadiness on feet: Secondary | ICD-10-CM

## 2022-10-11 NOTE — Therapy (Signed)
OUTPATIENT PHYSICAL THERAPY THORACOLUMBAR TREATMENT   Patient Name: David Goodman MRN: [id number] DOB:21-Jul-1941, 81 y.o., male Today's Date: 10/11/2022   PT End of Session - 10/11/22 1152     Visit Number 6    Date for PT Re-Evaluation 12/13/22    PT Start Time 8882    PT Stop Time 1225    PT Time Calculation (min) 40 min    Activity Tolerance Patient tolerated treatment well    Behavior During Therapy [facility] for tasks assessed/performed                Past Medical History:  Diagnosis Date   ALLERGIC RHINITIS 07/13/2007   Aorta dilated on Echo; Normal sized Aorta on CT    Chest CT 1/23: No thoracic aortic aneurysm; coronary calcifications; aortic atherosclerosis; improved aeration of lungs with persistent minimal reticular opacities   CAD (coronary artery disease)    GERD (gastroesophageal reflux disease)    GI bleed    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) ([place])    HYPERLIPIDEMIA 07/13/2007   HYPERTENSION 07/13/2007   Impaired glucose tolerance 09/27/2016   Iron deficiency anemia due to chronic blood loss 02/27/2021   NEPHROLITHIASIS, 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   NSVT (nonsustained ventricular tachycardia) ([place])    PEPTIC ULCER DISEASE 07/13/2007   PVC's (premature ventricular contractions)    SVT (supraventricular tachycardia)    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   Past Surgical History:  Procedure Laterality Date   BROW LIFT Bilateral 11/10/2020   Procedure: BLEPHAROPLASTY;  Surgeon: [name], MD;  Location: [place];  Service: Plastics;  Laterality: Bilateral;  1 hour   COLONOSCOPY N/A 04/12/2022   Procedure: COLONOSCOPY;  Surgeon: Arta Silence, MD;  Location: [facility];  Service: Gastroenterology;  Laterality: N/A;   COLONOSCOPY WITH PROPOFOL N/A 02/17/2019   Procedure: COLONOSCOPY WITH PROPOFOL;  Surgeon: Ronnette Juniper, MD;  Location: [facility];  Service: Gastroenterology;  Laterality: N/A;   CORONARY STENT  INTERVENTION N/A 08/25/2021   Procedure: CORONARY STENT INTERVENTION;  Surgeon: Jettie Booze, MD;  Location: [place] [facility];  Service: Cardiovascular;  Laterality: N/A;   HYDROCELE EXCISION Right 05/15/2003   INTRAVASCULAR LITHOTRIPSY  08/25/2021   Procedure: INTRAVASCULAR LITHOTRIPSY;  Surgeon: Jettie Booze, MD;  Location: [place] [facility];  Service: Cardiovascular;;   INTRAVASCULAR ULTRASOUND/IVUS N/A 08/25/2021   Procedure: Intravascular Ultrasound/IVUS;  Surgeon: Jettie Booze, MD;  Location: [place] [facility];  Service: Cardiovascular;  Laterality: N/A;   IR ANGIOGRAM VISCERAL SELECTIVE  02/16/2019   IR ANGIOGRAM VISCERAL SELECTIVE  02/16/2019   IR ANGIOGRAM VISCERAL SELECTIVE  02/16/2019   IR US GUIDE VASC ACCESS RIGHT  02/16/2019   LEFT HEART CATH AND CORONARY ANGIOGRAPHY N/A 07/28/2021   Procedure: LEFT HEART CATH AND CORONARY ANGIOGRAPHY;  Surgeon: Jettie Booze, MD;  Location: [place] [facility];  Service: Cardiovascular;  Laterality: N/A;   LEFT HEART CATH AND CORONARY ANGIOGRAPHY N/A 08/25/2021   Procedure: LEFT HEART CATH AND CORONARY ANGIOGRAPHY;  Surgeon: Jettie Booze, MD;  Location: [place] [facility];  Service: Cardiovascular;  Laterality: N/A;   ROTATOR CUFF REPAIR     SPERMATOCELECTOMY Right 05/15/2003   Patient Active Problem List   Diagnosis Date Noted   Hypertensive retinopathy 09/27/2022   Posterior vitreous detachment of right eye 09/27/2022   Spondylosis of lumbar spine 09/09/2022   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 09/09/2022   Gait instability 09/09/2022   Acute right-sided low back pain without sciatica 09/03/2022  Chronic rhinitis 08/04/2022   Iron deficiency anemia due to chronic blood loss 07/22/2022   Personal history of colonic polyps 07/22/2022   Gastro-esophageal reflux disease with esophagitis 07/22/2022   Gastric polyp 07/22/2022   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   GIB (gastrointestinal bleeding)  04/11/2022   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   Vivid dream 09/21/2021   Excessive daytime sleepiness 09/21/2021   Snoring 09/21/2021   Coronary artery disease involving native coronary artery of native heart 09/21/2021   SOBOE (shortness of breath on exertion) 09/21/2021   Coronary artery disease    HFrEF (heart failure with reduced ejection fraction) (HCC)    Bell's palsy, left 06/05/2021   Cerebrovascular disease 06/05/2021   Hiatal hernia 05/29/2021   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   Erectile dysfunction due to arterial insufficiency 02/27/2021   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 12/29/2020   Hemorrhoids 07/17/2020   Syncope 11/15/2018   Diverticulitis 05/25/2018   Osteoarthritis of right knee 12/13/2017   Osteoarthritis of left knee 12/13/2017   Testosterone deficiency 03/22/2017   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/27/2016   BPH (benign prostatic hyperplasia) 06/18/2013   HLD (hyperlipidemia) 07/13/2007   Essential hypertension 07/13/2007   Allergic rhinitis 07/13/2007   Peptic ulcer disease 07/13/2007   History of nephrolithiasis 07/13/2007    PCP: Haydee Salter, MD   REFERRING PROVIDER: Haydee Salter, MD   REFERRING DIAG: Diagnosis M47.816 (ICD-10-CM) - Spondylosis of lumbar spine M43.17 (ICD-10-CM) - 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 R26.81 (ICD-10-CM) - Gait instability   Rationale for Evaluation and Treatment Rehabilitation  THERAPY DIAG:  Acute bilateral low back pain without sciatica  Muscle weakness (generalized)  Difficulty in walking, not elsewhere classified  Unsteadiness on feet  Abnormal posture  Other lack of coordination  ONSET DATE: 09/09/2022   SUBJECTIVE:                                                                                                                                                                                           SUBJECTIVE  STATEMENT: feeling fine now, some pain on the R side, pain was 8/10 this morning but it went away after walking around. DN helped the L side  PERTINENT HISTORY:  David Goodman was seen 2 days prior to this with acute lower right back pain. He notes this continues to give him some difficulty. He denies any lower extremity weakness or numbness. He has had his wife massage his right lower back and this does help provide temporary relief. In addition, he notes that he has difficulty walking at times. He wonders  if this is because of some of his medicines. He has some mild dizziness when he stands up, but has learned to pause until this resolves. He finds when he walks he tends to stagger some to one side.  Hiatal Hernia  PAIN:  Are you having pain? Yes: NPRS scale: 1/10 Pain location: low back, R and L, moves Pain description: sharp, goes into hips, but not legs Aggravating factors: Unknown Relieving factors: Naprosin helps, but he is limited in using it due to diverticulitis  PRECAUTIONS: None  WEIGHT BEARING RESTRICTIONS: No  FALLS:  Has patient fallen in last 6 months? Yes. 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   OBJECTIVE:   DIAGNOSTIC FINDINGS:  Lumbar x-ray: Spondylosis of L3-L5 with significant deformity of the L4 vertebrae.There appears to be some spondylolisthesis at the L5-S1 vertebrae.   1. Acute bilateral low back pain without sciatica 2. Spondylosis of lumbar spine 3. 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 Motyka has significant arthritic changes in the lower lumbar spine. This is likely leading to muscular strain causing his current pain. As he  did not tolerate a muscle relaxer. I recommend we focus more on physical therapy to try and get him some relief.   SCREENING FOR RED FLAGS: Bowel or bladder incontinence: No Spinal tumors: No Cauda equina syndrome: No Compression fracture: No Abdominal aneurysm: No  COGNITION: Overall cognitive status: Within functional limits for tasks assessed    MUSCLE LENGTH: Hamstrings: Right 71 deg; Left 65 deg Thomas test: B hip flexor tightness  POSTURE: rounded shoulders, forward head, decreased lumbar lordosis, and flexed trunk   PALPATION: TTP L lower lumbar paraspinals, Glut medius, R glut medius, with trigger point in the muscle.  LUMBAR ROM:  Lumbar AROM WNL in all planes except B rotation mildly limited B.  LOWER EXTREMITY ROM:    Generally WFL, but mildly stiff in all planes of hip movement.   LOWER EXTREMITY MMT:    MMT Right eval Left eval  Hip flexion 4- 4-  Hip extension 3+ 3+  Hip abduction 4- 3+  Hip adduction    Hip internal rotation    Hip external rotation    Knee flexion 4- 4-  Knee extension 4- 4-  Ankle dorsiflexion 4- 4-  Ankle plantarflexion    Ankle inversion    Ankle eversion     (Blank rows = not tested)  LUMBAR SPECIAL TESTS:  Straight leg raise test: Negative and Slump test: Negative  FUNCTIONAL TESTS:  5 times sit to stand: 14.4 Functional gait assessment: 15  GAIT: Distance walked: in clinic Assistive device utilized: None Level of assistance: Complete Independence Comments: unsteady with lateral LOB to each side, slow.  TODAY'S TREATMENT:  DATE:  10/11/22 NuStep L5 x 6 minutes, then 2 x 3 Supine exercises- pelvic tilt (had difficulty achieving), Bridge x 10, repeat with hip IR x 10 reps. Roll KTC on ball, 2 x 10 reps Alternating leg lifts off physioball while stabilizing with opposite leg, 10 each Supine  clamshells against Green Tband, 2 x 10 reps Sit to stand from elevated mat on airex pad, while holding 2# ball in BUE, and with G tband at knees to engage hips, 2 x 5 reps Heel raises, 2 x 10 on back bar.  10/06/22 Nustep L5 x6 min Hooklying trunk rotation x10 reps Sidelying hip abduction x10 reps Supine bridges 2x10 Seated pallof press yellow TB 2x10 reps Standing shoulder ext green TB 2x10 reps Lt SKTC 5x10 sec hold  Standing hip abd hold, extension hold 3x5 sec each side  Manual: trigger point release Lt glute med, piriformis   10/04/22 NuStep L5 x 6 min DN to lumb paraspinals BIL by Michele Mcalpine PT Supine bridges Supine Hs stretch with band 3x10'' LE stretches Single K2C, Double K2C, Piriformis,  Standing shoulder Ext green, Rows blue 2x10 Alt 6in box taps    09/30/22 Nustep L 4 6 min Black tband trunk flex and ext 2 sets 10 Seated row and lat pull down 20# 2 sets 10 Supine core stab ex with cuing for core engagement, control and speed 15 min PROM and stretching LE and trunk=LE tightness BIL DN to lumb paraspinals BIL by Michele Mcalpine PT   09/23/22 Bike L4 x32mns Stretching hamstrings, SK2C, and glutes Feet on pball rotations, knees to chest 2x10 Bridges 2x10 SLR 2x10 Supine single knee fallouts green 2x10 S2S 2x10 Leg ext 10# 2x10 HS curls 25# 2x10 Shoulder ext 10# 2x10    PATIENT EDUCATION:  Education details: POC Person educated: Patient Education method: Explanation Education comprehension: verbalized understanding  HOME EXERCISE PROGRAM: Access Code: [email or web address] URL: [email or web address] Date: 10/04/2022 Prepared by: RCheri Fowler Exercises - Supine Bridge  - 1 x daily - 7 x weekly - 3 sets - 10 reps - Supine Hamstring Stretch with Strap  - 1 x daily - 7 x weekly - 3 sets - 10 reps - Standing Shoulder Extension with Resistance  - 1 x daily - 7 x weekly - 3 sets - 10 reps  ASSESSMENT:  CLINICAL IMPRESSION: Patient reports improved  pain. Treatment continued to emphasize hip and trunk stability and strength to improve postural control and decrease instability.  OBJECTIVE IMPAIRMENTS: Abnormal gait, decreased activity tolerance, decreased balance, decreased coordination, difficulty walking, decreased ROM, decreased strength, increased muscle spasms, impaired flexibility, and pain.   ACTIVITY LIMITATIONS: carrying, lifting, bending, squatting, and locomotion level  PARTICIPATION LIMITATIONS: meal prep, cleaning, shopping, community activity, and yard work  PERSONAL FACTORS: Age and 1 comorbidity: lumbar spondylolisthesis  are also affecting patient's functional outcome.   REHAB POTENTIAL: Good  CLINICAL DECISION MAKING: Stable/uncomplicated  EVALUATION COMPLEXITY: Low   GOALS: Goals reviewed with patient? Yes  SHORT TERM GOALS: Target date: 10/18/2022  I with basic HEP Baseline: Goal status: 09/30/22 MET  LONG TERM GOALS: Target date: 12/13/2022  I with final HEP Baseline:  Goal status: INITIAL  2.  Increase FGA score to at least 24 Baseline: 15 Goal status: ongoing  3.  Decrease 5X STS to < 12 sec Baseline: 14.4 Goal status: ongoing  4.  Patient will tolerate at least 20 minutes on the bike or treadmill without increased back pain Baseline: Patient has stopped working out due to pain  Goal status: INITIAL  PLAN:  PT FREQUENCY: 2x/week  PT DURATION: 12 weeks  PLANNED INTERVENTIONS: Therapeutic exercises, Therapeutic activity, Neuromuscular re-education, Balance training, Gait training, Patient/Family education, Self Care, Joint mobilization, Stair training, Dry Needling, Cryotherapy, Moist heat, Ionotophoresis 1m/ml Dexamethasone, and Manual therapy.  PLAN FOR NEXT SESSION: more DN to Lt glutes/paraspinals; progress core and hip stab and stretching    Patient Details  Name: JGwyn MehringMRN: [id number] of Birth: [birth year]Referring Provider:  RHaydee Salter MD  Encounter Date:  10/11/2022   SEthel RanaDPT 10/11/22 12:31 PM  [place] [id number]: [phone]  Fax:  [phone]

## 2022-10-13 ENCOUNTER — Ambulatory Visit: Payer: Medicare Other | Admitting: Physical Therapy

## 2022-10-13 DIAGNOSIS — R293 Abnormal posture: Secondary | ICD-10-CM | POA: Diagnosis not present

## 2022-10-13 DIAGNOSIS — R262 Difficulty in walking, not elsewhere classified: Secondary | ICD-10-CM

## 2022-10-13 DIAGNOSIS — R278 Other lack of coordination: Secondary | ICD-10-CM | POA: Diagnosis not present

## 2022-10-13 DIAGNOSIS — M6281 Muscle weakness (generalized): Secondary | ICD-10-CM

## 2022-10-13 DIAGNOSIS — M545 Low back pain, unspecified: Secondary | ICD-10-CM

## 2022-10-13 DIAGNOSIS — R2681 Unsteadiness on feet: Secondary | ICD-10-CM

## 2022-10-13 NOTE — Therapy (Signed)
OUTPATIENT PHYSICAL THERAPY THORACOLUMBAR TREATMENT   Patient Name: David Goodman MRN: [id number] DOB:1941-09-27, 81 y.o., male Today's Date: 10/13/2022   PT End of Session - 10/13/22 1151     Visit Number 7    Date for PT Re-Evaluation 12/13/22    PT Start Time 1147    PT Stop Time 1225    PT Time Calculation (min) 38 min    Activity Tolerance Patient tolerated treatment well    Behavior During Therapy David Goodman for tasks assessed/performed                 Past Medical History:  Diagnosis Date   ALLERGIC RHINITIS 07/13/2007   Aorta dilated on Echo; Normal sized Aorta on CT    Chest CT 1/23: No thoracic aortic aneurysm; coronary calcifications; aortic atherosclerosis; improved aeration of lungs with persistent minimal reticular opacities   CAD (coronary artery disease)    GERD (gastroesophageal reflux disease)    GI bleed    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) ([place])    HYPERLIPIDEMIA 07/13/2007   HYPERTENSION 07/13/2007   Impaired glucose tolerance 09/27/2016   Iron deficiency anemia due to chronic blood loss 02/27/2021   NEPHROLITHIASIS, 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   NSVT (nonsustained ventricular tachycardia) ([place])    PEPTIC ULCER DISEASE 07/13/2007   PVC's (premature ventricular contractions)    SVT (supraventricular tachycardia)    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   Past Surgical History:  Procedure Laterality Date   BROW LIFT Bilateral 11/10/2020   Procedure: BLEPHAROPLASTY;  Surgeon: David Presume, MD;  Location: [facility];  Service: Plastics;  Laterality: Bilateral;  1 hour   COLONOSCOPY N/A 04/12/2022   Procedure: COLONOSCOPY;  Surgeon: David Silence, MD;  Location: [facility];  Service: Gastroenterology;  Laterality: N/A;   COLONOSCOPY WITH PROPOFOL N/A 02/17/2019   Procedure: COLONOSCOPY WITH PROPOFOL;  Surgeon: David Juniper, MD;  Location: [facility];  Service: Gastroenterology;  Laterality: N/A;   CORONARY STENT  INTERVENTION N/A 08/25/2021   Procedure: CORONARY STENT INTERVENTION;  Surgeon: David Booze, MD;  Location: [facility];  Service: Cardiovascular;  Laterality: N/A;   HYDROCELE EXCISION Right 05/15/2003   INTRAVASCULAR LITHOTRIPSY  08/25/2021   Procedure: INTRAVASCULAR LITHOTRIPSY;  Surgeon: David Booze, MD;  Location: [facility];  Service: Cardiovascular;;   INTRAVASCULAR ULTRASOUND/IVUS N/A 08/25/2021   Procedure: Intravascular Ultrasound/IVUS;  Surgeon: David Booze, MD;  Location: [facility];  Service: Cardiovascular;  Laterality: N/A;   IR ANGIOGRAM VISCERAL SELECTIVE  02/16/2019   IR ANGIOGRAM VISCERAL SELECTIVE  02/16/2019   IR ANGIOGRAM VISCERAL SELECTIVE  02/16/2019   IR US GUIDE VASC ACCESS RIGHT  02/16/2019   LEFT HEART CATH AND CORONARY ANGIOGRAPHY N/A 07/28/2021   Procedure: LEFT HEART CATH AND CORONARY ANGIOGRAPHY;  Surgeon: David Booze, MD;  Location: [facility];  Service: Cardiovascular;  Laterality: N/A;   LEFT HEART CATH AND CORONARY ANGIOGRAPHY N/A 08/25/2021   Procedure: LEFT HEART CATH AND CORONARY ANGIOGRAPHY;  Surgeon: David Booze, MD;  Location: [facility];  Service: Cardiovascular;  Laterality: N/A;   ROTATOR CUFF REPAIR     SPERMATOCELECTOMY Right 05/15/2003   Patient Active Problem List   Diagnosis Date Noted   Hypertensive retinopathy 09/27/2022   Posterior vitreous detachment of right eye 09/27/2022   Spondylosis of lumbar spine 09/09/2022   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 09/09/2022   Gait instability 09/09/2022   Acute right-sided low back pain without sciatica 09/03/2022  Chronic rhinitis 08/04/2022   Iron deficiency anemia due to chronic blood loss 07/22/2022   Personal history of colonic polyps 07/22/2022   Gastro-esophageal reflux disease with esophagitis 07/22/2022   Gastric polyp 07/22/2022   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   GIB (gastrointestinal bleeding)  04/11/2022   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   Vivid dream 09/21/2021   Excessive daytime sleepiness 09/21/2021   Snoring 09/21/2021   Coronary artery disease involving native coronary artery of native heart 09/21/2021   SOBOE (shortness of breath on exertion) 09/21/2021   Coronary artery disease    HFrEF (heart failure with reduced ejection fraction) (HCC)    Bell's palsy, left 06/05/2021   Cerebrovascular disease 06/05/2021   Hiatal hernia 05/29/2021   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   Erectile dysfunction due to arterial insufficiency 02/27/2021   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 12/29/2020   Hemorrhoids 07/17/2020   Syncope 11/15/2018   Diverticulitis 05/25/2018   Osteoarthritis of right knee 12/13/2017   Osteoarthritis of left knee 12/13/2017   Testosterone deficiency 03/22/2017   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/27/2016   BPH (benign prostatic hyperplasia) 06/18/2013   HLD (hyperlipidemia) 07/13/2007   Essential hypertension 07/13/2007   Allergic rhinitis 07/13/2007   Peptic ulcer disease 07/13/2007   History of nephrolithiasis 07/13/2007    PCP: David Salter, MD   REFERRING PROVIDER: Haydee Salter, MD   REFERRING DIAG: Diagnosis M47.816 (ICD-10-CM) - Spondylosis of lumbar spine M43.17 (ICD-10-CM) - 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 R26.81 (ICD-10-CM) - Gait instability   Rationale for Evaluation and Treatment Rehabilitation  THERAPY DIAG:  Acute bilateral low back pain without sciatica  Muscle weakness (generalized)  Difficulty in walking, not elsewhere classified  Unsteadiness on feet  Abnormal posture  Other lack of coordination  ONSET DATE: 09/09/2022   SUBJECTIVE:                                                                                                                                                                                           SUBJECTIVE  STATEMENT: Patient reports that his back pain returned last night after he sat in a meeting for 2 hours in a hard chair. It is better today, still about 4/10.  PERTINENT HISTORY:  David Goodman was seen 2 days prior to this with acute lower right back pain. He notes this continues to give him some difficulty. He denies any lower extremity weakness or numbness. He has had his wife massage his right lower back and this does help provide temporary relief. In addition, he notes that he has difficulty walking at  times. He wonders if this is because of some of his medicines. He has some mild dizziness when he stands up, but has learned to pause until this resolves. He finds when he walks he tends to stagger some to one side.  Hiatal Hernia  PAIN:  Are you having pain? Yes: NPRS scale: 1/10 Pain location: low back, R and L, moves Pain description: sharp, goes into hips, but not legs Aggravating factors: Unknown Relieving factors: Naprosin helps, but he is limited in using it due to diverticulitis  PRECAUTIONS: None  WEIGHT BEARING RESTRICTIONS: No  FALLS:  Has patient fallen in last 6 months? Yes. 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   OBJECTIVE:   DIAGNOSTIC FINDINGS:  Lumbar x-ray: Spondylosis of L3-L5 with significant deformity of the L4 vertebrae.There appears to be some spondylolisthesis at the L5-S1 vertebrae.   1. Acute bilateral low back pain without sciatica 2. Spondylosis of lumbar spine 3. 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 Oehlert has significant arthritic changes in the lower lumbar spine. This is likely leading to muscular strain causing his  current pain. As he did not tolerate a muscle relaxer. I recommend we focus more on physical therapy to try and get him some relief.   SCREENING FOR RED FLAGS: Bowel or bladder incontinence: No Spinal tumors: No Cauda equina syndrome: No Compression fracture: No Abdominal aneurysm: No  COGNITION: Overall cognitive status: Within functional limits for tasks assessed    MUSCLE LENGTH: Hamstrings: Right 71 deg; Left 65 deg Thomas test: B hip flexor tightness  POSTURE: rounded shoulders, forward head, decreased lumbar lordosis, and flexed trunk   PALPATION: TTP L lower lumbar paraspinals, Glut medius, R glut medius, with trigger point in the muscle.  LUMBAR ROM:  Lumbar AROM WNL in all planes except B rotation mildly limited B.  LOWER EXTREMITY ROM:    Generally WFL, but mildly stiff in all planes of hip movement.   LOWER EXTREMITY MMT:    MMT Right eval Left eval  Hip flexion 4- 4-  Hip extension 3+ 3+  Hip abduction 4- 3+  Hip adduction    Hip internal rotation    Hip external rotation    Knee flexion 4- 4-  Knee extension 4- 4-  Ankle dorsiflexion 4- 4-  Ankle plantarflexion    Ankle inversion    Ankle eversion     (Blank rows = not tested)  LUMBAR SPECIAL TESTS:  Straight leg raise test: Negative and Slump test: Negative  FUNCTIONAL TESTS:  5 times sit to stand: 14.4 Functional gait assessment: 15  GAIT: Distance walked: in clinic Assistive device utilized: None Level of assistance: Complete Independence Comments: unsteady with lateral LOB to each side, slow.  TODAY'S TREATMENT:  DATE:  10/13/22 NuStep L5 x 6 minutes STM in supine using tennis ball under L gluts Supine passive stretch-HS with strap, abd/add with strap. 3 x 10 sec each Supine strengthening- Bridge over physioball x 10, Repeat with hip IR, Bridge and roll ball side  to side x 5. Alternately lifting each leg off ball while stabilizing with opposite leg, 8 reps each leg. B KTC on ball x 10 reps Standing shoulder ext, rows, ER with G tband resistance, 2 x 10 for each. Pect stretch in doorway, 3 x 15 sec.  10/11/22 NuStep L5 x 6 minutes, then 2 x 3 Supine exercises- pelvic tilt (had difficulty achieving), Bridge x 10, repeat with hip IR x 10 reps. Roll KTC on ball, 2 x 10 reps Alternating leg lifts off physioball while stabilizing with opposite leg, 10 each Supine clamshells against Green Tband, 2 x 10 reps Sit to stand from elevated mat on airex pad, while holding 2# ball in BUE, and with G tband at knees to engage hips, 2 x 5 reps Heel raises, 2 x 10 on back bar.  10/06/22 Nustep L5 x6 min Hooklying trunk rotation x10 reps Sidelying hip abduction x10 reps Supine bridges 2x10 Seated pallof press yellow TB 2x10 reps Standing shoulder ext green TB 2x10 reps Lt SKTC 5x10 sec hold  Standing hip abd hold, extension hold 3x5 sec each side  Manual: trigger point release Lt glute med, piriformis   10/04/22 NuStep L5 x 6 min DN to lumb paraspinals BIL by Michele Mcalpine PT Supine bridges Supine Hs stretch with band 3x10'' LE stretches Single K2C, Double K2C, Piriformis,  Standing shoulder Ext green, Rows blue 2x10 Alt 6in box taps    09/30/22 Nustep L 4 6 min Black tband trunk flex and ext 2 sets 10 Seated row and lat pull down 20# 2 sets 10 Supine core stab ex with cuing for core engagement, control and speed 15 min PROM and stretching LE and trunk=LE tightness BIL DN to lumb paraspinals BIL by Michele Mcalpine PT   09/23/22 Bike L4 x42mns Stretching hamstrings, SK2C, and glutes Feet on pball rotations, knees to chest 2x10 Bridges 2x10 SLR 2x10 Supine single knee fallouts green 2x10 S2S 2x10 Leg ext 10# 2x10 HS curls 25# 2x10 Shoulder ext 10# 2x10    PATIENT EDUCATION:  Education details: POC Person educated: Patient Education method:  Explanation Education comprehension: verbalized understanding  HOME EXERCISE PROGRAM: Access Code: [email or web address] URL: [email or web address] Date: 10/04/2022 Prepared by: RCheri Fowler Exercises - Supine Bridge  - 1 x daily - 7 x weekly - 3 sets - 10 reps - Supine Hamstring Stretch with Strap  - 1 x daily - 7 x weekly - 3 sets - 10 reps - Standing Shoulder Extension with Resistance  - 1 x daily - 7 x weekly - 3 sets - 10 reps  ASSESSMENT:  CLINICAL IMPRESSION: Patient reports that his back is a little sore today after sitting in a hard chair x 2 hours yesterday in a meeting. Initiated treatment with STM to L glut F/B stretching and then strengthening for back and trunk. He reported improved pain after treatment. [place] Will plan D/C on visit on 11/29  OBJECTIVE IMPAIRMENTS: Abnormal gait, decreased activity tolerance, decreased balance, decreased coordination, difficulty walking, decreased ROM, decreased strength, increased muscle spasms, impaired flexibility, and pain.   ACTIVITY LIMITATIONS: carrying, lifting, bending, squatting, and locomotion level  PARTICIPATION LIMITATIONS: meal prep, cleaning, shopping, community  activity, and yard work  PERSONAL FACTORS: Age and 1 comorbidity: lumbar spondylolisthesis  are also affecting patient's functional outcome.   REHAB POTENTIAL: Good  CLINICAL DECISION MAKING: Stable/uncomplicated  EVALUATION COMPLEXITY: Low   GOALS: Goals reviewed with patient? Yes  SHORT TERM GOALS: Target date: 10/18/2022  I with basic HEP Baseline: Goal status: 09/30/22 MET  LONG TERM GOALS: Target date: 12/13/2022  I with final HEP Baseline:  Goal status: INITIAL  2.  Increase FGA score to at least 24 Baseline: 15 Goal status: ongoing  3.  Decrease 5X STS to < 12 sec Baseline: 14.4 Goal status: ongoing  4.  Patient will tolerate at least 20 minutes on the bike or treadmill without increased back  pain Baseline: Patient has stopped working out due to pain Goal status: INITIAL  PLAN:  PT FREQUENCY: 2x/week  PT DURATION: 12 weeks  PLANNED INTERVENTIONS: Therapeutic exercises, Therapeutic activity, Neuromuscular re-education, Balance training, Gait training, Patient/Family education, Self Care, Joint mobilization, Stair training, Dry Needling, Cryotherapy, Moist heat, Ionotophoresis 33m/ml Dexamethasone, and Manual therapy.  PLAN FOR NEXT SESSION: more DN to Lt glutes/paraspinals; progress core and hip stab and stretching    Patient Details  Name: JJiraiya McewanMRN: [id number] of Birth: [birth year]Referring Provider:  RHaydee Salter MD  Encounter Date: 10/13/2022   SEthel RanaDPT 10/13/22 12:26 PM  [place]  [place] [address]Phone: [phone]  Fax:  [phone]

## 2022-10-15 DIAGNOSIS — G4733 Obstructive sleep apnea (adult) (pediatric): Secondary | ICD-10-CM | POA: Diagnosis not present

## 2022-10-17 NOTE — Progress Notes (Unsigned)
On Monday  Follow Up Note  RE: David Goodman MRN: [id number] DOB: 08-Aug-1941 Date of Office Visit: 10/18/2022  Referring provider: Haydee Salter, MD Primary care provider: Haydee Salter, MD  Chief Complaint: Allergic Rhinitis  (Runny nose and drainage - not able to use CPAP machine due to symptoms )  History of Present Illness: I had the pleasure of seeing David Goodman for a follow up visit at the [facility] on 10/18/2022. He is a 81 y.o. male, who is being followed for nonallergic rhinitis. His previous allergy office visit was on 09/20/2022 with Althea Charon FNP. Today is a regular follow up visit.  Chronic rhinitis Still having issues with nasal congestion and rhinorrhea.  Using Atrovent with no benefit.  Taking Tylenol sinus which is the only thing that seems to help. Sometimes takes it twice per day. He has a CPAP machine for a few months now and hasn't been able to use it due to the nasal congestion.  No recent ENT evaluation. Apparently in the past he would get an "allergy" shot every few months which seemed to help.  Assessment and Plan: David Goodman is a 82 y.o. male with: Nonallergic rhinitis Past history - Main complaint of perennial rhinorrhea worsening in the past 5-6 months. Tried zyrtec and Flonase with no benefit. 2016 skin testing positive to mold and cockroach. No prior AIT. Saw ENT for dizziness in the past. On PPI for GERD.  2023 skin testing was negative to indoor/outdoor allergens. Interim history - still symptomatic, can't wear cpap machine, taking tylenol sinus.  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 Discussed that sometimes CPAP can worsen sinus symptoms.  Advised to avoid OTC decongestants for daily use.  The shot he was getting a few times per year was most likely steroid injections which are not good use for long term control due to its side effects.  Recommend ENT referral next to look at sinus anatomy. Nasal saline spray  (i.e., Simply Saline) or nasal saline lavage (i.e., NeilMed) is recommended as needed and prior to medicated nasal sprays. Start Ryaltris (olopatadine + mometasone nasal spray combination) 1-2 sprays per nostril twice a day. Sample given. This replaces your other nasal sprays. If this works well for you, then have [facility] ship the medication to your home - prescription already sent in.   Return in about 2 months (around 12/18/2022).  Meds ordered this encounter  Medications   Olopatadine-Mometasone (RYALTRIS) 665-25 MCG/ACT SUSP    Sig: Place 1-2 sprays into the nose in the morning and at bedtime.    Dispense:  29 g    Refill:  5    [id number]   Lab Orders  No laboratory test(s) ordered today    Diagnostics: None.   Medication List:  Current Outpatient Medications  Medication Sig Dispense Refill   Ascorbic Acid (VITAMIN C) 1000 MG tablet Take 1,000 mg by mouth 2 (two) times a week.     aspirin EC 81 MG tablet Take 1 tablet (81 mg total) by mouth daily. Swallow whole. 90 tablet 3   latanoprost (XALATAN) 0.005 % ophthalmic solution Place 1 drop into both eyes at bedtime. 7.5 mL 1   MAGNESIUM PO Take 400 mg by mouth at bedtime.     metoprolol succinate (TOPROL XL) 25 MG 24 hr tablet Take 1 tablet (25 mg total) by mouth daily. 90 tablet 3   Multiple Vitamin (MULTIVITAMIN WITH MINERALS) TABS tablet Take 1 tablet by mouth daily.  nitroGLYCERIN (NITROSTAT) 0.4 MG SL tablet Place 1 tablet (0.4 mg total) under the tongue every 5 (five) minutes as needed for chest pain. 25 tablet 3   Olopatadine-Mometasone (RYALTRIS) 665-25 MCG/ACT SUSP Place 1-2 sprays into the nose in the morning and at bedtime. 29 g 5   pantoprazole (PROTONIX) 40 MG tablet Take 1 tablet (40 mg total) by mouth daily. 30 tablet 11   Polyvinyl Alcohol-Povidone (REFRESH OP) Place 1 drop into the left eye 2 (two) times daily as needed (dryness).     psyllium (METAMUCIL) 58.6 % powder Take 1 packet by mouth daily.      rosuvastatin (CRESTOR) 20 MG tablet Take 1 tablet (20 mg total) by mouth daily. 30 tablet 1   tadalafil (CIALIS) 20 MG tablet Take 1 tablet (20 mg total) by mouth daily as needed for erectile dysfunction. 30 tablet 2   tamsulosin (FLOMAX) 0.4 MG CAPS capsule Take 1 capsule (0.4 mg total) by mouth daily. 90 capsule 3   valsartan (DIOVAN) 320 MG tablet Take 1 tablet (320 mg total) by mouth daily. 90 tablet 3   No current facility-administered medications for this visit.   Allergies: No Known Allergies I reviewed his past medical history, social history, family history, and environmental history and no significant changes have been reported from his previous visit.  Review of Systems  Constitutional:  Negative for appetite change, chills, fever and unexpected weight change.  HENT:  Positive for congestion, rhinorrhea and sneezing.   Eyes:  Negative for itching.  Respiratory:  Negative for cough, chest tightness, shortness of breath and wheezing.   Cardiovascular:  Negative for chest pain.  Gastrointestinal:  Negative for abdominal pain.  Genitourinary:  Negative for difficulty urinating.  Skin:  Negative for rash.  Neurological:  Positive for headaches.    Objective: BP (!) 142/84   Pulse 77   Temp 98.1 F (36.7 C)   Resp 16   Ht 5' 1.5" (1.562 m)   Wt 178 lb 11.2 oz (81.1 kg)   SpO2 95%   BMI 33.22 kg/m  Body mass index is 33.22 kg/m. Physical Exam Vitals and nursing note reviewed.  Constitutional:      Appearance: Normal appearance. He is well-developed.  HENT:     Head: Normocephalic and atraumatic.     Right Ear: Tympanic membrane and external ear normal.     Left Ear: Tympanic membrane and external ear normal.     Nose:     Comments: Dried nasal discharge b/l, no significant hypertrophy.    Mouth/Throat:     Mouth: Mucous membranes are moist.     Pharynx: Oropharynx is clear.  Eyes:     Conjunctiva/sclera: Conjunctivae normal.  Cardiovascular:     Rate and  Rhythm: Normal rate and regular rhythm.     Heart sounds: Normal heart sounds. No murmur heard.    No friction rub. No gallop.  Pulmonary:     Effort: Pulmonary effort is normal.     Breath sounds: Normal breath sounds. No wheezing, rhonchi or rales.  Musculoskeletal:     Cervical back: Neck supple.  Skin:    General: Skin is warm.     Findings: No rash.  Neurological:     Mental Status: He is alert and oriented to person, place, and time.  Psychiatric:        Behavior: Behavior normal.    Previous notes and tests were reviewed. The plan was reviewed with the patient/family, and all questions/concerned were addressed.  It was my pleasure to see David Goodman today and participate in his care. Please feel free to contact me with any questions or concerns.  Sincerely,  Rexene Alberts, DO Allergy & Immunology  [facility] office: [place] office: [phone]

## 2022-10-18 ENCOUNTER — Telehealth: Payer: Self-pay

## 2022-10-18 ENCOUNTER — Other Ambulatory Visit: Payer: Self-pay

## 2022-10-18 ENCOUNTER — Ambulatory Visit: Payer: Medicare Other | Admitting: Allergy

## 2022-10-18 ENCOUNTER — Encounter: Payer: Self-pay | Admitting: Allergy

## 2022-10-18 VITALS — BP 142/84 | HR 77 | Temp 98.1°F | Resp 16 | Ht 61.5 in | Wt 178.7 lb

## 2022-10-18 DIAGNOSIS — J310 Chronic rhinitis: Secondary | ICD-10-CM

## 2022-10-18 MED ORDER — RYALTRIS 665-25 MCG/ACT NA SUSP: 1.0000 | Freq: Two times a day (BID) | NASAL | 5 refills | Status: DC

## 2022-10-18 NOTE — Telephone Encounter (Addendum)
Per Dr. Maudie Mercury refer patient to [facility] ENT for nonallergic rhinitis

## 2022-10-18 NOTE — Patient Instructions (Addendum)
Rhinitis You have non-allergic rhinitis. Recommend ENT referral next to look at sinus anatomy. [facility] E[facility].  Nasal saline spray (i.e., Simply Saline) or nasal saline lavage (i.e., NeilMed) is recommended as needed and prior to medicated nasal sprays. Start Ryaltris (olopatadine + mometasone nasal spray combination) 1-2 sprays per nostril twice a day. Sample given. This replaces your other nasal sprays. If this works well for you, then have [facility] ship the medication to your home - prescription already sent in.   Follow up in 2 month or sooner if needed.  Buffered Isotonic Saline Irrigations:  Goal: When you irrigate with the isotonic saline (salt water) it washes mucous and other debris from your nose that could be contributing to your nasal symptoms.   Recipe: Obtain 1 quart jar that is clean Fill with clean (bottled, boiled or distilled) water Add 1-2 heaping teaspoons of salt without iodine If the solution with 2 teaspoons of salt is too strong, adjust the amount down until better tolerated Add 1 teaspoon of [employer] baking soda (pure bicarbonate) Mix ingredients together and store at room temperature and discard after 1 week * Alternatively you can buy pre made salt packets for the NeilMed bottle or there          are other over the counter brands available  Instructions: Warm  cup of the solution in the microwave if desired but be careful not to overheat as this will burn the inside of your nose Stand over a sink (or do it while you shower) and squirt the solution into one side of your nose aiming towards the back of your head Sometimes saying "coca cola" while irrigating can be helpful to prevent fluid from going down your throat  The solution will travel to the back of your nose and then come out the other side Perform this again on the other side Try to do this twice a day If you are using a nasal spray in addition to the irrigation, irrigate first and then use the  topical nasal spray otherwise you will wash the nasal spray out of your nose.

## 2022-10-18 NOTE — Assessment & Plan Note (Signed)
Past history - Main complaint of perennial rhinorrhea worsening in the past 5-6 months. Tried zyrtec and Flonase with no benefit. 2016 skin testing positive to mold and cockroach. No prior AIT. Saw ENT for dizziness in the past. On PPI for GERD.  2023 skin testing was negative to indoor/outdoor allergens. Interim history - still symptomatic, can't wear cpap machine, taking tylenol sinus.  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 Discussed that sometimes CPAP can worsen sinus symptoms.  Advised to avoid OTC decongestants for daily use.  The shot he was getting a few times per year was most likely steroid injections which are not good use for long term control due to its side effects.  Recommend ENT referral next to look at sinus anatomy. Nasal saline spray (i.e., Simply Saline) or nasal saline lavage (i.e., NeilMed) is recommended as needed and prior to medicated nasal sprays. Start Ryaltris (olopatadine + mometasone nasal spray combination) 1-2 sprays per nostril twice a day. Sample given. This replaces your other nasal sprays. If this works well for you, then have [facility] ship the medication to your home - prescription already sent in.

## 2022-10-19 ENCOUNTER — Ambulatory Visit: Payer: Medicare Other | Admitting: Physical Therapy

## 2022-10-19 DIAGNOSIS — R278 Other lack of coordination: Secondary | ICD-10-CM | POA: Diagnosis not present

## 2022-10-19 DIAGNOSIS — R2681 Unsteadiness on feet: Secondary | ICD-10-CM | POA: Diagnosis not present

## 2022-10-19 DIAGNOSIS — R262 Difficulty in walking, not elsewhere classified: Secondary | ICD-10-CM | POA: Diagnosis not present

## 2022-10-19 DIAGNOSIS — M545 Low back pain, unspecified: Secondary | ICD-10-CM | POA: Diagnosis not present

## 2022-10-19 DIAGNOSIS — M6281 Muscle weakness (generalized): Secondary | ICD-10-CM

## 2022-10-19 DIAGNOSIS — R293 Abnormal posture: Secondary | ICD-10-CM | POA: Diagnosis not present

## 2022-10-19 NOTE — Therapy (Signed)
OUTPATIENT PHYSICAL THERAPY THORACOLUMBAR TREATMENT   Patient Name: David Goodman MRN: [id number] DOB:1941-07-28, 81 y.o., male Today's Date: 10/19/2022   PT End of Session - 10/19/22 1136     Visit Number 8    Date for PT Re-Evaluation 12/13/22    PT Start Time 1138    PT Stop Time 1220    PT Time Calculation (min) 42 min                 Past Medical History:  Diagnosis Date   ALLERGIC RHINITIS 07/13/2007   Aorta dilated on Echo; Normal sized Aorta on CT    Chest CT 1/23: No thoracic aortic aneurysm; coronary calcifications; aortic atherosclerosis; improved aeration of lungs with persistent minimal reticular opacities   CAD (coronary artery disease)    GERD (gastroesophageal reflux disease)    GI bleed    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) ([place])    HYPERLIPIDEMIA 07/13/2007   HYPERTENSION 07/13/2007   Impaired glucose tolerance 09/27/2016   Iron deficiency anemia due to chronic blood loss 02/27/2021   NEPHROLITHIASIS, 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   NSVT (nonsustained ventricular tachycardia) ([place])    PEPTIC ULCER DISEASE 07/13/2007   PVC's (premature ventricular contractions)    SVT (supraventricular tachycardia)    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   Past Surgical History:  Procedure Laterality Date   BROW LIFT Bilateral 11/10/2020   Procedure: BLEPHAROPLASTY;  Surgeon: [name], MD;  Location: [facility];  Service: Plastics;  Laterality: Bilateral;  1 hour   COLONOSCOPY N/A 04/12/2022   Procedure: COLONOSCOPY;  Surgeon: Arta Silence, MD;  Location: [facility];  Service: Gastroenterology;  Laterality: N/A;   COLONOSCOPY WITH PROPOFOL N/A 02/17/2019   Procedure: COLONOSCOPY WITH PROPOFOL;  Surgeon: Ronnette Juniper, MD;  Location: [facility];  Service: Gastroenterology;  Laterality: N/A;   CORONARY STENT INTERVENTION N/A 08/25/2021   Procedure: CORONARY STENT INTERVENTION;  Surgeon: Jettie Booze, MD;  Location: [facility];  Service: Cardiovascular;  Laterality: N/A;   HYDROCELE EXCISION Right 05/15/2003   INTRAVASCULAR LITHOTRIPSY  08/25/2021   Procedure: INTRAVASCULAR LITHOTRIPSY;  Surgeon: Jettie Booze, MD;  Location: [facility];  Service: Cardiovascular;;   INTRAVASCULAR ULTRASOUND/IVUS N/A 08/25/2021   Procedure: Intravascular Ultrasound/IVUS;  Surgeon: Jettie Booze, MD;  Location: [facility];  Service: Cardiovascular;  Laterality: N/A;   IR ANGIOGRAM VISCERAL SELECTIVE  02/16/2019   IR ANGIOGRAM VISCERAL SELECTIVE  02/16/2019   IR ANGIOGRAM VISCERAL SELECTIVE  02/16/2019   IR US GUIDE VASC ACCESS RIGHT  02/16/2019   LEFT HEART CATH AND CORONARY ANGIOGRAPHY N/A 07/28/2021   Procedure: LEFT HEART CATH AND CORONARY ANGIOGRAPHY;  Surgeon: Jettie Booze, MD;  Location: [facility];  Service: Cardiovascular;  Laterality: N/A;   LEFT HEART CATH AND CORONARY ANGIOGRAPHY N/A 08/25/2021   Procedure: LEFT HEART CATH AND CORONARY ANGIOGRAPHY;  Surgeon: Jettie Booze, MD;  Location: [facility];  Service: Cardiovascular;  Laterality: N/A;   ROTATOR CUFF REPAIR     SPERMATOCELECTOMY Right 05/15/2003   Patient Active Problem List   Diagnosis Date Noted   Hypertensive retinopathy 09/27/2022   Posterior vitreous detachment of right eye 09/27/2022   Spondylosis of lumbar spine 09/09/2022   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 09/09/2022   Gait instability 09/09/2022   Acute right-sided low back pain without sciatica 09/03/2022   Nonallergic rhinitis 08/04/2022   Iron deficiency anemia due to chronic blood loss 07/22/2022   Personal  history of colonic polyps 07/22/2022   Gastro-esophageal reflux disease with esophagitis 07/22/2022   Gastric polyp 07/22/2022   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   GIB (gastrointestinal bleeding) 04/11/2022   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   Vivid dream 09/21/2021   Excessive daytime sleepiness 09/21/2021    Snoring 09/21/2021   Coronary artery disease involving native coronary artery of native heart 09/21/2021   SOBOE (shortness of breath on exertion) 09/21/2021   Coronary artery disease    HFrEF (heart failure with reduced ejection fraction) (H[facility])    Bell's palsy, left 06/05/2021   Cerebrovascular disease 06/05/2021   Hiatal hernia 05/29/2021   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   Erectile dysfunction due to arterial insufficiency 02/27/2021   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 12/29/2020   Hemorrhoids 07/17/2020   Syncope 11/15/2018   Diverticulitis 05/25/2018   Osteoarthritis of right knee 12/13/2017   Osteoarthritis of left knee 12/13/2017   Testosterone deficiency 03/22/2017   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/27/2016   BPH (benign prostatic hyperplasia) 06/18/2013   HLD (hyperlipidemia) 07/13/2007   Essential hypertension 07/13/2007   Allergic rhinitis 07/13/2007   Peptic ulcer disease 07/13/2007   History of nephrolithiasis 07/13/2007    PCP: Haydee Salter, MD   REFERRING PROVIDER: Haydee Salter, MD   REFERRING DIAG: Diagnosis M47.816 (ICD-10-CM) - Spondylosis of lumbar spine M43.17 (ICD-10-CM) - 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 Pain on left LB that comes and goes. Overall pt states PT is helping. Pt sttaes struggling with hernia and makes him SOB at  times  PERTINENT HISTORY:  Mr. Seubert was seen 2 days prior to this with acute lower right back pain. He notes this continues to give him some difficulty. He denies any lower extremity weakness or numbness. He has had his wife massage his right lower back and this does help provide temporary relief. In addition, he notes that he has difficulty walking at times. He wonders if this is because of some of his medicines. He has some mild dizziness when he stands up, but has learned  to pause until this resolves. He finds when he walks he tends to stagger some to one side.  Hiatal Hernia  PAIN:  Are you having pain? Yes: NPRS scale: 4/10 Pain location: low back,LEft LB Pain description: comes and goes Aggravating factors: Unknown Relieving factors: Naprosin helps, but he is limited in using it due to diverticulitis  PRECAUTIONS: None  WEIGHT BEARING RESTRICTIONS: No  FALLS:  Has patient fallen in last 6 months? Yes. 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   OBJECTIVE:   DIAGNOSTIC FINDINGS:  Lumbar x-ray: Spondylosis of L3-L5 with significant deformity of the L4 vertebrae.There appears to be some spondylolisthesis at the L5-S1 vertebrae.   1. Acute bilateral low back pain without sciatica 2. Spondylosis of lumbar spine 3. 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 Mcbreen has significant arthritic changes in the lower lumbar spine. This is likely leading to muscular strain causing his current pain. As he did not tolerate a muscle relaxer. I recommend we focus more on physical therapy to try and get him some relief.   SCREENING FOR RED FLAGS: Bowel or bladder  incontinence: No Spinal tumors: No Cauda equina syndrome: No Compression fracture: No Abdominal aneurysm: No  COGNITION: Overall cognitive status: Within functional limits for tasks assessed    MUSCLE LENGTH: Hamstrings: Right 71 deg; Left 65 deg Thomas test: B hip flexor tightness  POSTURE: rounded shoulders, forward head, decreased lumbar lordosis, and flexed trunk   PALPATION: TTP L lower lumbar paraspinals, Glut medius, R glut medius, with trigger point in the muscle.  LUMBAR ROM:  Lumbar AROM WNL in all planes except B rotation mildly limited B.  LOWER EXTREMITY ROM:    Generally WFL, but mildly stiff in all planes of hip movement.   LOWER EXTREMITY MMT:    MMT Right eval Left eval  Hip flexion 4- 4-  Hip extension 3+ 3+  Hip abduction 4- 3+  Hip adduction    Hip internal rotation    Hip external rotation    Knee flexion 4- 4-  Knee extension 4- 4-  Ankle dorsiflexion 4- 4-  Ankle plantarflexion    Ankle inversion    Ankle eversion     (Blank rows = not tested)  LUMBAR SPECIAL TESTS:  Straight leg raise test: Negative and Slump test: Negative  FUNCTIONAL TESTS:  5 times sit to stand: 14.4 Functional gait assessment: 15  GAIT: Distance walked: in clinic Assistive device utilized: None Level of assistance: Complete Independence Comments: unsteady with lateral LOB to each side, slow.  TODAY'S TREATMENT:  DATE:   10/19/22 Nustep L 5 81mn 30# resisted gait fwd 5 x  and 4 x each side MinA with coming back all directions STS on airex 10 x CGA Fwd/backward and lateral stepping on/off airex  Min A Leg press 3 sets 12 feet 3 way 30# to increase hip strength Knee ext 2 sets 10 10# HS curl 2 sets 10 25#  Supine feet on ball bridge, KTC and obl 20 x Iso abdominals 15 x PROM/stretch LE and trunk    10/13/22 NuStep L5 x 6  minutes STM in supine using tennis ball under L gluts Supine passive stretch-HS with strap, abd/add with strap. 3 x 10 sec each Supine strengthening- Bridge over physioball x 10, Repeat with hip IR, Bridge and roll ball side to side x 5. Alternately lifting each leg off ball while stabilizing with opposite leg, 8 reps each leg. B KTC on ball x 10 reps Standing shoulder ext, rows, ER with G tband resistance, 2 x 10 for each. Pect stretch in doorway, 3 x 15 sec.  10/11/22 NuStep L5 x 6 minutes, then 2 x 3 Supine exercises- pelvic tilt (had difficulty achieving), Bridge x 10, repeat with hip IR x 10 reps. Roll KTC on ball, 2 x 10 reps Alternating leg lifts off physioball while stabilizing with opposite leg, 10 each Supine clamshells against Green Tband, 2 x 10 reps Sit to stand from elevated mat on airex pad, while holding 2# ball in BUE, and with G tband at knees to engage hips, 2 x 5 reps Heel raises, 2 x 10 on back bar.  10/06/22 Nustep L5 x6 min Hooklying trunk rotation x10 reps Sidelying hip abduction x10 reps Supine bridges 2x10 Seated pallof press yellow TB 2x10 reps Standing shoulder ext green TB 2x10 reps Lt SKTC 5x10 sec hold  Standing hip abd hold, extension hold 3x5 sec each side  Manual: trigger point release Lt glute med, piriformis   10/04/22 NuStep L5 x 6 min DN to lumb paraspinals BIL by MMichele McalpinePT Supine bridges Supine Hs stretch with band 3x10'' LE stretches Single K2C, Double K2C, Piriformis,  Standing shoulder Ext green, Rows blue 2x10 Alt 6in box taps    09/30/22 Nustep L 4 6 min Black tband trunk flex and ext 2 sets 10 Seated row and lat pull down 20# 2 sets 10 Supine core stab ex with cuing for core engagement, control and speed 15 min PROM and stretching LE and trunk=LE tightness BIL DN to lumb paraspinals BIL by MMichele McalpinePT   09/23/22 Bike L4 x561ms Stretching hamstrings, SK2C, and glutes Feet on pball rotations, knees to chest 2x10 Bridges  2x10 SLR 2x10 Supine single knee fallouts green 2x10 S2S 2x10 Leg ext 10# 2x10 HS curls 25# 2x10 Shoulder ext 10# 2x10    PATIENT EDUCATION:  Education details: POC Person educated: Patient Education method: Explanation Education comprehension: verbalized understanding  HOME EXERCISE PROGRAM: Access Code: [email or web address] URL: [email or web address] Date: 10/04/2022 Prepared by: RoCheri FowlerExercises - Supine Bridge  - 1 x daily - 7 x weekly - 3 sets - 10 reps - Supine Hamstring Stretch with Strap  - 1 x daily - 7 x weekly - 3 sets - 10 reps - Standing Shoulder Extension with Resistance  - 1 x daily - 7 x weekly - 3 sets - 10 reps  ASSESSMENT:  CLINICAL IMPRESSION: progressed strength and balance ex as noted above. Addressed goals [place]  Will plan D/C on visit on 11/29  OBJECTIVE IMPAIRMENTS: Abnormal gait, decreased activity tolerance, decreased balance, decreased coordination, difficulty walking, decreased ROM, decreased strength, increased muscle spasms, impaired flexibility, and pain.   ACTIVITY LIMITATIONS: carrying, lifting, bending, squatting, and locomotion level  PARTICIPATION LIMITATIONS: meal prep, cleaning, shopping, community activity, and yard work  PERSONAL FACTORS: Age and 1 comorbidity: lumbar spondylolisthesis  are also affecting patient's functional outcome.   REHAB POTENTIAL: Good  CLINICAL DECISION MAKING: Stable/uncomplicated  EVALUATION COMPLEXITY: Low   GOALS: Goals reviewed with patient? Yes  SHORT TERM GOALS: Target date: 10/18/2022  I with basic HEP Baseline: Goal status: 09/30/22 MET  LONG TERM GOALS: Target date: 12/13/2022  I with final HEP Baseline:  Goal status: 10/19/22 on going  2.  Increase FGA score to at least 24 Baseline: 15 Goal status: ongoing  3.  Decrease 5X STS to < 12 sec Baseline: 14.4 Goal status: met 10/19/22  12 sec  4.  Patient will tolerate at least 20  minutes on the bike or treadmill without increased back pain Baseline: Patient has stopped working out due to pain Goal status: on going 10/19/22  PLAN:  PT FREQUENCY: 2x/week  PT DURATION: 12 weeks  PLANNED INTERVENTIONS: Therapeutic exercises, Therapeutic activity, Neuromuscular re-education, Balance training, Gait training, Patient/Family education, Self Care, Joint mobilization, Stair training, Dry Needling, Cryotherapy, Moist heat, Ionotophoresis 13m/ml Dexamethasone, and Manual therapy.  PLAN FOR NEXT SESSION: more DN to Lt glutes/paraspinals; progress core and hip stab and stretching    Patient Details  Name: David FitzmauriceMRN: [id number] of Birth: 109/04/1942Referring Provider:  RHaydee Salter MD  Encounter Date: 10/19/2022   AFranki MontePTA 10/19/22 11:36 AM  [place]  [place] [id number]: [phone]  Fax:  [address] [address]Phone: [phone]  Fax:  [phone]

## 2022-10-20 DIAGNOSIS — H401122 Primary open-angle glaucoma, left eye, moderate stage: Secondary | ICD-10-CM | POA: Diagnosis not present

## 2022-10-25 ENCOUNTER — Ambulatory Visit: Payer: Medicare Other | Admitting: Physical Therapy

## 2022-10-25 ENCOUNTER — Encounter: Payer: Self-pay | Admitting: Physical Therapy

## 2022-10-25 DIAGNOSIS — R293 Abnormal posture: Secondary | ICD-10-CM | POA: Diagnosis not present

## 2022-10-25 DIAGNOSIS — R278 Other lack of coordination: Secondary | ICD-10-CM | POA: Diagnosis not present

## 2022-10-25 DIAGNOSIS — M545 Low back pain, unspecified: Secondary | ICD-10-CM | POA: Diagnosis not present

## 2022-10-25 DIAGNOSIS — R262 Difficulty in walking, not elsewhere classified: Secondary | ICD-10-CM | POA: Diagnosis not present

## 2022-10-25 DIAGNOSIS — R2681 Unsteadiness on feet: Secondary | ICD-10-CM

## 2022-10-25 DIAGNOSIS — M6281 Muscle weakness (generalized): Secondary | ICD-10-CM

## 2022-10-25 NOTE — Therapy (Signed)
OUTPATIENT PHYSICAL THERAPY THORACOLUMBAR TREATMENT   Patient Name: David Goodman MRN: [id number] DOB:October 21, 1941, 81 y.o., male Today's Date: 10/25/2022   PT End of Session - 10/25/22 1147     Visit Number 9    Date for PT Re-Evaluation 12/13/22    PT Start Time 1142    PT Stop Time 1222    PT Time Calculation (min) 40 min    Activity Tolerance Patient tolerated treatment well    Behavior During Therapy [facility] for tasks assessed/performed                  Past Medical History:  Diagnosis Date   ALLERGIC RHINITIS 07/13/2007   Aorta dilated on Echo; Normal sized Aorta on CT    Chest CT 1/23: No thoracic aortic aneurysm; coronary calcifications; aortic atherosclerosis; improved aeration of lungs with persistent minimal reticular opacities   CAD (coronary artery disease)    GERD (gastroesophageal reflux disease)    GI bleed    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) ([place])    HYPERLIPIDEMIA 07/13/2007   HYPERTENSION 07/13/2007   Impaired glucose tolerance 09/27/2016   Iron deficiency anemia due to chronic blood loss 02/27/2021   NEPHROLITHIASIS, 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   NSVT (nonsustained ventricular tachycardia) ([place])    PEPTIC ULCER DISEASE 07/13/2007   PVC's (premature ventricular contractions)    SVT (supraventricular tachycardia)    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   Past Surgical History:  Procedure Laterality Date   BROW LIFT Bilateral 11/10/2020   Procedure: BLEPHAROPLASTY;  Surgeon: [name], MD;  Location: [place];  Service: Plastics;  Laterality: Bilateral;  1 hour   COLONOSCOPY N/A 04/12/2022   Procedure: COLONOSCOPY;  Surgeon: Arta Silence, MD;  Location: [facility];  Service: Gastroenterology;  Laterality: N/A;   COLONOSCOPY WITH PROPOFOL N/A 02/17/2019   Procedure: COLONOSCOPY WITH PROPOFOL;  Surgeon: Ronnette Juniper, MD;  Location: [facility];  Service: Gastroenterology;  Laterality: N/A;   CORONARY STENT  INTERVENTION N/A 08/25/2021   Procedure: CORONARY STENT INTERVENTION;  Surgeon: Jettie Booze, MD;  Location: [place] [facility];  Service: Cardiovascular;  Laterality: N/A;   HYDROCELE EXCISION Right 05/15/2003   INTRAVASCULAR LITHOTRIPSY  08/25/2021   Procedure: INTRAVASCULAR LITHOTRIPSY;  Surgeon: Jettie Booze, MD;  Location: [place] [facility];  Service: Cardiovascular;;   INTRAVASCULAR ULTRASOUND/IVUS N/A 08/25/2021   Procedure: Intravascular Ultrasound/IVUS;  Surgeon: Jettie Booze, MD;  Location: [place] [facility];  Service: Cardiovascular;  Laterality: N/A;   IR ANGIOGRAM VISCERAL SELECTIVE  02/16/2019   IR ANGIOGRAM VISCERAL SELECTIVE  02/16/2019   IR ANGIOGRAM VISCERAL SELECTIVE  02/16/2019   IR US GUIDE VASC ACCESS RIGHT  02/16/2019   LEFT HEART CATH AND CORONARY ANGIOGRAPHY N/A 07/28/2021   Procedure: LEFT HEART CATH AND CORONARY ANGIOGRAPHY;  Surgeon: Jettie Booze, MD;  Location: [place] [facility];  Service: Cardiovascular;  Laterality: N/A;   LEFT HEART CATH AND CORONARY ANGIOGRAPHY N/A 08/25/2021   Procedure: LEFT HEART CATH AND CORONARY ANGIOGRAPHY;  Surgeon: Jettie Booze, MD;  Location: [place] [facility];  Service: Cardiovascular;  Laterality: N/A;   ROTATOR CUFF REPAIR     SPERMATOCELECTOMY Right 05/15/2003   Patient Active Problem List   Diagnosis Date Noted   Hypertensive retinopathy 09/27/2022   Posterior vitreous detachment of right eye 09/27/2022   Spondylosis of lumbar spine 09/09/2022   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 09/09/2022   Gait instability 09/09/2022   Acute right-sided low back pain without sciatica  09/03/2022   Nonallergic rhinitis 08/04/2022   Iron deficiency anemia due to chronic blood loss 07/22/2022   Personal history of colonic polyps 07/22/2022   Gastro-esophageal reflux disease with esophagitis 07/22/2022   Gastric polyp 07/22/2022   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   GIB (gastrointestinal bleeding)  04/11/2022   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   Vivid dream 09/21/2021   Excessive daytime sleepiness 09/21/2021   Snoring 09/21/2021   Coronary artery disease involving native coronary artery of native heart 09/21/2021   SOBOE (shortness of breath on exertion) 09/21/2021   Coronary artery disease    HFrEF (heart failure with reduced ejection fraction) (HCC)    Bell's palsy, left 06/05/2021   Cerebrovascular disease 06/05/2021   Hiatal hernia 05/29/2021   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   Erectile dysfunction due to arterial insufficiency 02/27/2021   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 12/29/2020   Hemorrhoids 07/17/2020   Syncope 11/15/2018   Diverticulitis 05/25/2018   Osteoarthritis of right knee 12/13/2017   Osteoarthritis of left knee 12/13/2017   Testosterone deficiency 03/22/2017   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/27/2016   BPH (benign prostatic hyperplasia) 06/18/2013   HLD (hyperlipidemia) 07/13/2007   Essential hypertension 07/13/2007   Allergic rhinitis 07/13/2007   Peptic ulcer disease 07/13/2007   History of nephrolithiasis 07/13/2007    PCP: Haydee Salter, MD   REFERRING PROVIDER: Haydee Salter, MD   REFERRING DIAG: Diagnosis M47.816 (ICD-10-CM) - Spondylosis of lumbar spine M43.17 (ICD-10-CM) - 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 R26.81 (ICD-10-CM) - Gait instability   Rationale for Evaluation and Treatment Rehabilitation  THERAPY DIAG:  Acute bilateral low back pain without sciatica  Muscle weakness (generalized)  Difficulty in walking, not elsewhere classified  Unsteadiness on feet  Abnormal posture  Other lack of coordination  ONSET DATE: 09/09/2022   SUBJECTIVE:                                                                                                                                                                                           SUBJECTIVE  STATEMENT: " alittle shakey" pt denies falls but feels unsteady. Pain on left LB that comes and goes. Overall pt states PT is helping. Pt sttaes struggling with hernia and makes him SOB at times  PERTINENT HISTORY:  Mr. Carnero was seen 2 days prior to this with acute lower right back pain. He notes this continues to give him some difficulty. He denies any lower extremity weakness or numbness. He has had his wife massage his right lower back and this does help provide temporary relief. In addition,  he notes that he has difficulty walking at times. He wonders if this is because of some of his medicines. He has some mild dizziness when he stands up, but has learned to pause until this resolves. He finds when he walks he tends to stagger some to one side.  Hiatal Hernia  PAIN:  Are you having pain? Yes: NPRS scale: 4/10 Pain location: low back,LEft LB Pain description: comes and goes Aggravating factors: Unknown Relieving factors: Naprosin helps, but he is limited in using it due to diverticulitis  PRECAUTIONS: None  WEIGHT BEARING RESTRICTIONS: No  FALLS:  Has patient fallen in last 6 months? Yes. 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   OBJECTIVE:   DIAGNOSTIC FINDINGS:  Lumbar x-ray: Spondylosis of L3-L5 with significant deformity of the L4 vertebrae.There appears to be some spondylolisthesis at the L5-S1 vertebrae.   1. Acute bilateral low back pain without sciatica 2. Spondylosis of lumbar spine 3. 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 Currier has significant arthritic changes in the lower lumbar spine. This is likely leading to muscular strain  causing his current pain. As he did not tolerate a muscle relaxer. I recommend we focus more on physical therapy to try and get him some relief.   SCREENING FOR RED FLAGS: Bowel or bladder incontinence: No Spinal tumors: No Cauda equina syndrome: No Compression fracture: No Abdominal aneurysm: No  COGNITION: Overall cognitive status: Within functional limits for tasks assessed    MUSCLE LENGTH: Hamstrings: Right 71 deg; Left 65 deg Thomas test: B hip flexor tightness  POSTURE: rounded shoulders, forward head, decreased lumbar lordosis, and flexed trunk   PALPATION: TTP L lower lumbar paraspinals, Glut medius, R glut medius, with trigger point in the muscle.  LUMBAR ROM:  Lumbar AROM WNL in all planes except B rotation mildly limited B.  LOWER EXTREMITY ROM:    Generally WFL, but mildly stiff in all planes of hip movement.   LOWER EXTREMITY MMT:    MMT Right eval Left eval  Hip flexion 4- 4-  Hip extension 3+ 3+  Hip abduction 4- 3+  Hip adduction    Hip internal rotation    Hip external rotation    Knee flexion 4- 4-  Knee extension 4- 4-  Ankle dorsiflexion 4- 4-  Ankle plantarflexion    Ankle inversion    Ankle eversion     (Blank rows = not tested)  LUMBAR SPECIAL TESTS:  Straight leg raise test: Negative and Slump test: Negative  FUNCTIONAL TESTS:  5 times sit to stand: 14.4 Functional gait assessment: 15  GAIT: Distance walked: in clinic Assistive device utilized: None Level of assistance: Complete Independence Comments: unsteady with lateral LOB to each side, slow.  TODAY'S TREATMENT:  DATE:  10/25/22 NuStep L5 x 6 minutes DN by Roma Schanz, PT to L piriformis and Glut medius Supine figure 4 stretch for L piriformis, 3 x 10 sec Supine glut stretch on L, 3 x 10 sec Supine bridge with g tband resistance 3x 10 Supine  clamshells against g tband 3 x 10 Shoulder ER against G tband, 2 x 10 B side step against G tband, 4 x 5 steps Standing heel raises with BUE support  10/19/22 Nustep L 5 107mn 30# resisted gait fwd 5 x  and 4 x each side MinA with coming back all directions STS on airex 10 x CGA Fwd/backward and lateral stepping on/off airex  Min A Leg press 3 sets 12 feet 3 way 30# to increase hip strength Knee ext 2 sets 10 10# HS curl 2 sets 10 25#  Supine feet on ball bridge, KTC and obl 20 x Iso abdominals 15 x PROM/stretch LE and trunk    10/13/22 NuStep L5 x 6 minutes STM in supine using tennis ball under L gluts Supine passive stretch-HS with strap, abd/add with strap. 3 x 10 sec each Supine strengthening- Bridge over physioball x 10, Repeat with hip IR, Bridge and roll ball side to side x 5. Alternately lifting each leg off ball while stabilizing with opposite leg, 8 reps each leg. B KTC on ball x 10 reps Standing shoulder ext, rows, ER with G tband resistance, 2 x 10 for each. Pect stretch in doorway, 3 x 15 sec.  10/11/22 NuStep L5 x 6 minutes, then 2 x 3 Supine exercises- pelvic tilt (had difficulty achieving), Bridge x 10, repeat with hip IR x 10 reps. Roll KTC on ball, 2 x 10 reps Alternating leg lifts off physioball while stabilizing with opposite leg, 10 each Supine clamshells against Green Tband, 2 x 10 reps Sit to stand from elevated mat on airex pad, while holding 2# ball in BUE, and with G tband at knees to engage hips, 2 x 5 reps Heel raises, 2 x 10 on back bar.  10/06/22 Nustep L5 x6 min Hooklying trunk rotation x10 reps Sidelying hip abduction x10 reps Supine bridges 2x10 Seated pallof press yellow TB 2x10 reps Standing shoulder ext green TB 2x10 reps Lt SKTC 5x10 sec hold  Standing hip abd hold, extension hold 3x5 sec each side  Manual: trigger point release Lt glute med, piriformis   10/04/22 NuStep L5 x 6 min DN to lumb paraspinals BIL by MMichele McalpinePT Supine  bridges Supine Hs stretch with band 3x10'' LE stretches Single K2C, Double K2C, Piriformis,  Standing shoulder Ext green, Rows blue 2x10 Alt 6in box taps    09/30/22 Nustep L 4 6 min Black tband trunk flex and ext 2 sets 10 Seated row and lat pull down 20# 2 sets 10 Supine core stab ex with cuing for core engagement, control and speed 15 min PROM and stretching LE and trunk=LE tightness BIL DN to lumb paraspinals BIL by MMichele McalpinePT   09/23/22 Bike L4 x562ms Stretching hamstrings, SK2C, and glutes Feet on pball rotations, knees to chest 2x10 Bridges 2x10 SLR 2x10 Supine single knee fallouts green 2x10 S2S 2x10 Leg ext 10# 2x10 HS curls 25# 2x10 Shoulder ext 10# 2x10    PATIENT EDUCATION:  Education details: POC Person educated: Patient Education method: Explanation Education comprehension: verbalized understanding  HOME EXERCISE PROGRAM: Access Code: [email or web address] URL: [email or web address] Date: 10/04/2022 Prepared by: RoCheri Fowler ASSESSMENT:  CLINICAL IMPRESSION: Patient reports improved back  pain, still bothered by hernia. His L piriformis is still painful. Provided DN F/B stretch and strengthening to hips and lower trunk. Updated HEP as paitent is planning a 2 week trip to [place]. He will schedule a visit for after his trip for re-assessment. He plans a 2 week trip to [place] on 11/30. Will plan D/C on visit on 11/29  OBJECTIVE IMPAIRMENTS: Abnormal gait, decreased activity tolerance, decreased balance, decreased coordination, difficulty walking, decreased ROM, decreased strength, increased muscle spasms, impaired flexibility, and pain.   ACTIVITY LIMITATIONS: carrying, lifting, bending, squatting, and locomotion level  PARTICIPATION LIMITATIONS: meal prep, cleaning, shopping, community activity, and yard work  PERSONAL FACTORS: Age and 1 comorbidity: lumbar spondylolisthesis  are also affecting patient's functional outcome.   REHAB POTENTIAL:  Good  CLINICAL DECISION MAKING: Stable/uncomplicated  EVALUATION COMPLEXITY: Low   GOALS: Goals reviewed with patient? Yes  SHORT TERM GOALS: Target date: 10/18/2022  I with basic HEP Baseline: Goal status: 09/30/22 MET  LONG TERM GOALS: Target date: 12/13/2022  I with final HEP Baseline:  Goal status: 10/19/22 on going  2.  Increase FGA score to at least 24 Baseline: 15 Goal status: ongoing  3.  Decrease 5X STS to < 12 sec Baseline: 14.4 Goal status: met 10/19/22  12 sec  4.  Patient will tolerate at least 20 minutes on the bike or treadmill without increased back pain Baseline: Patient has stopped working out due to pain Goal status: on going 10/19/22  PLAN:  PT FREQUENCY: 2x/week  PT DURATION: 12 weeks  PLANNED INTERVENTIONS: Therapeutic exercises, Therapeutic activity, Neuromuscular re-education, Balance training, Gait training, Patient/Family education, Self Care, Joint mobilization, Stair training, Dry Needling, Cryotherapy, Moist heat, Ionotophoresis 6m/ml Dexamethasone, and Manual therapy.  PLAN FOR NEXT SESSION: How is the hip pain?, HEP?, need to cont therapy?    Patient Details  Name: JEarland ReishMRN: [id number] of Birth: 117-Mar-1942Referring Provider:  RHaydee Salter MD  Encounter Date: 10/25/2022   SEthel RanaDPT 10/25/22 12:24 PM  [place] [address]Phone: [phone]  Fax:  [address] [address]Phone: [phone]  Fax:  [phone]

## 2022-10-26 ENCOUNTER — Ambulatory Visit: Payer: Self-pay

## 2022-10-26 ENCOUNTER — Encounter: Payer: Self-pay | Admitting: Family Medicine

## 2022-10-26 ENCOUNTER — Telehealth: Payer: Self-pay | Admitting: Family Medicine

## 2022-10-26 ENCOUNTER — Ambulatory Visit: Payer: Medicare Other | Admitting: Orthopedic Surgery

## 2022-10-26 ENCOUNTER — Encounter: Payer: Self-pay | Admitting: Orthopedic Surgery

## 2022-10-26 ENCOUNTER — Ambulatory Visit (INDEPENDENT_AMBULATORY_CARE_PROVIDER_SITE_OTHER): Payer: Medicare Other | Admitting: Family Medicine

## 2022-10-26 VITALS — BP 162/100 | HR 80 | Temp 97.4°F | Ht 61.5 in | Wt 174.4 lb

## 2022-10-26 VITALS — BP 192/88 | HR 79 | Ht 61.5 in | Wt 175.0 lb

## 2022-10-26 DIAGNOSIS — K449 Diaphragmatic hernia without obstruction or gangrene: Secondary | ICD-10-CM

## 2022-10-26 DIAGNOSIS — I10 Essential (primary) hypertension: Secondary | ICD-10-CM

## 2022-10-26 DIAGNOSIS — E785 Hyperlipidemia, unspecified: Secondary | ICD-10-CM | POA: Diagnosis not present

## 2022-10-26 DIAGNOSIS — I5020 Unspecified systolic (congestive) heart failure: Secondary | ICD-10-CM

## 2022-10-26 DIAGNOSIS — G9331 Postviral fatigue syndrome: Secondary | ICD-10-CM | POA: Diagnosis not present

## 2022-10-26 DIAGNOSIS — G8929 Other chronic pain: Secondary | ICD-10-CM | POA: Diagnosis not present

## 2022-10-26 DIAGNOSIS — M545 Low back pain, unspecified: Secondary | ICD-10-CM | POA: Diagnosis not present

## 2022-10-26 LAB — LIPID PANEL
Cholesterol: 121 mg/dL (ref 0–200)
HDL: 43.3 mg/dL (ref >39.00)
LDL Cholesterol: 44 mg/dL (ref 0–99)
NonHDL: 77.54
Total CHOL/HDL Ratio: 3
Triglycerides: 170 mg/dL — ABNORMAL HIGH (ref 0.0–149.0)
VLDL: 34 mg/dL (ref 0.0–40.0)

## 2022-10-26 LAB — VITAMIN B12: Vitamin B-12: >1500 pg/mL — ABNORMAL HIGH (ref 211–911)

## 2022-10-26 LAB — CBC
HCT: 40 % (ref 39.0–52.0)
Hemoglobin: 13.1 g/dL (ref 13.0–17.0)
MCHC: 32.7 g/dL (ref 30.0–36.0)
MCV: 81.8 fl (ref 78.0–100.0)
Platelets: 313 10*3/uL (ref 150.0–400.0)
RBC: 4.89 Mil/uL (ref 4.22–5.81)
RDW: 17.2 % — ABNORMAL HIGH (ref 11.5–15.5)
WBC: 10.6 10*3/uL — ABNORMAL HIGH (ref 4.0–10.5)

## 2022-10-26 LAB — VITAMIN D 25 HYDROXY (VIT D DEFICIENCY, FRACTURES): VITD: 43.12 ng/mL (ref 30.00–100.00)

## 2022-10-26 LAB — TSH: TSH: 2 u[IU]/mL (ref 0.35–5.50)

## 2022-10-26 MED ORDER — HYDROCHLOROTHIAZIDE 25 MG PO TABS: 25.0000 mg | ORAL_TABLET | Freq: Every day | ORAL | 3 refills | Status: DC

## 2022-10-26 MED ORDER — TRAMADOL HCL 50 MG PO TABS: 50.0000 mg | ORAL_TABLET | Freq: Every day | ORAL | 0 refills | Status: AC | PRN

## 2022-10-26 NOTE — Progress Notes (Signed)
Orthopedic Spine Surgery Office Note  Assessment: Patient is a 81 y.o. male with posterior bilateral hip pain.  His right side has gotten slightly better with physical therapy.  He is still having symptoms on the left side.  He has significant degenerative findings at L4-5 and L5-S1 with a spondylolisthesis at L5-S1.  His physical exam that was more consistent with SI joint pain.   Plan: -Explained that initially conservative treatment is tried as a significant number of patients may experience relief with these treatment modalities. Discussed that the conservative treatments include:  -activity modification  -physical therapy  -over the counter pain medications  -steroid injections -Patient has tried physical therapy, Tylenol, activity modification -Recommended diagnostic and therapeutic bilateral SI joint injections with Dr. Rolena Infante -Patient should return to office on an as-needed basis   Patient expressed understanding of the plan and all questions were answered to the patient's satisfaction.   ___________________________________________________________________________   History:  Patient is a 81 y.o. male who presents today for lumbar spine.  Patient has had 2 months of bilateral posterior hip pain.  There is no trauma or injury that brought on the pain.  States the pain is worse if he is standing for a long time or is particularly active.  He has done some physical therapy which has helped with the right-sided pain but is still having significant left-sided pain.  Pain does get better if he rests.  Does not radiate into the legs. Pointed to his left SI joint when asked where the pain is.    Weakness: Denies Symptoms of imbalance: Yes, chronic with no new changes Clumsiness with hands: Denies Paresthesias and numbness: Denies Bowel or bladder incontinence: Denies Saddle anesthesia: Denies  Treatments tried: Activity modification, physical therapy, Tylenol  Review of  systems: Denies fevers and chills, night sweats, unexplained weight loss, history of cancer.  Has had pain that wakes him at night  Past medical history: Hypertension GERD Sleep apnea Rheumatoid arthritis Diverticulitis  Allergies: NKDA  Past surgical history:  Bilateral rotator cuff repair  Social history: Denies use of nicotine product (smoking, vaping, patches, smokeless) Alcohol use: Yes, 1 drink per week Denies recreational drug use   Physical Exam:  General: no acute distress, appears stated age Neurologic: alert, answering questions appropriately, following commands Respiratory: unlabored breathing on room air, symmetric chest rise Psychiatric: appropriate affect, normal cadence to speech   MSK (spine):  -Strength exam      Left  Right EHL    4/5  5/5 TA    5/5  5/5 GSC    5/5  5/5 Knee extension  5/5  5/5 Hip flexion   5/5  5/5  -Sensory exam    Sensation intact to light touch in L3-S1 nerve distributions of bilateral lower extremities  -Achilles DTR: 1/4 on the left, 1/4 on the right -Patellar tendon DTR: 1/4 on the left, 1/4 on the right  -Straight leg raise: Negative -Contralateral straight leg raise: Negative -Clonus: no beats bilaterally  -Left hip exam: Positive SI joint compression test, positive Gaenslen's, positive Faber, no pain through range of motion of the hip, negative Stinchfield, tender to palpation over the SI joint -Right hip exam: Positive SI joint compression test, positive Gaenslen's, positive Faber, no pain through range of motion of the hip, negative Stinchfield, tender to palpation over the SI joint  Imaging: XR of the lumbar spine from 10/26/2022 and 09/09/2022 was independently reviewed and interpreted, showing disc height loss at L5/S1 and at L4/5. Loss of  lumbar lordosis. Has a spondylolisthesis at L5/S1 -shift about 1 mm between flexion and extension views.  Patient name: David Goodman Patient MRN: [id number] Date of visit:  10/26/22

## 2022-10-26 NOTE — Progress Notes (Signed)
[facility] [address] [address] Dept: [phone] Dept Fax: [phone]  Chronic Care Office Visit  Subjective:    [name], male    DOB: 1941-04-12, 81 y.o..   MRN: [id number]  Chief Complaint  Patient presents with   Follow-up    3 month f/u. Pt c/o hernia pain and left hip pain x 1 month    History of Present Illness:  Patient is in today for reassessment of chronic medical issues.  David Goodman has a history of hypertension, CAD, and HFrEF. He is managed on a daily aspirin, metoprolol succinate 25 mg daily and valsartan 320 mg daily. He was previously on Entresto, but could not afford the co-pay. He has noted his blood pressures at home running in the 140/90s. He is unsure of why this may be higher today.  David Goodman complains of some pain/pressure over the epigastrium associated with bending over or with some of the exercises he is doing as part of his PT. He notes they have him pulling his knee up to his chest on either side. This sets off his discomfort. He has a long-standing history of a hiatal hernia, so wonders if this is part of it.  David Goodman has been going to PT related to chronic bilateral low back pain. He notes the pain on the right has now resolved, but he continues to have pain in the left "hip". This primarily bothers him first thing in the morning and with standing while washing dishes at night. he does not feel like Tylenol is helpful. He knows he needs to avoid NSAIDs due to his heart failure.  David Goodman complains of chronically feeling tired out. He relates that this began when he had COVID and has continued since then.   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   GIB (gastrointestinal bleeding) 04/11/2022   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   Vivid dream 09/21/2021   Excessive daytime sleepiness 09/21/2021   Snoring 09/21/2021   Coronary artery disease involving native coronary artery of native heart 09/21/2021   SOBOE (shortness of breath on exertion) 09/21/2021   Coronary artery disease    HFrEF (heart failure with reduced ejection fraction) ([place])    Bell's palsy, left 06/05/2021   Cerebrovascular disease 06/05/2021   Hiatal hernia 05/29/2021   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   Erectile dysfunction due to arterial insufficiency 02/27/2021   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   Hemorrhoids 07/17/2020   Syncope 11/15/2018   Diverticulitis 05/25/2018   Osteoarthritis of right knee 12/13/2017   Osteoarthritis of left knee 12/13/2017   Testosterone deficiency 03/22/2017   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/27/2016   BPH (benign prostatic hyperplasia) 06/18/2013   HLD (hyperlipidemia) 07/13/2007   Essential hypertension 07/13/2007   Allergic rhinitis 07/13/2007   Peptic ulcer disease 07/13/2007   History of nephrolithiasis 07/13/2007  Past Surgical History:  Procedure Laterality Date   BROW LIFT Bilateral 11/10/2020   Procedure: BLEPHAROPLASTY;  Surgeon: [name], MD;  Location: [facility];  Service: Plastics;  Laterality: Bilateral;  1 hour   COLONOSCOPY N/A 04/12/2022   Procedure: COLONOSCOPY;  Surgeon: Arta Silence, MD;  Location: [facility];   Service: Gastroenterology;  Laterality: N/A;   COLONOSCOPY WITH PROPOFOL N/A 02/17/2019   Procedure: COLONOSCOPY WITH PROPOFOL;  Surgeon: Ronnette Juniper, MD;  Location: [facility];  Service: Gastroenterology;  Laterality: N/A;   CORONARY STENT INTERVENTION N/A 08/25/2021   Procedure: CORONARY STENT INTERVENTION;  Surgeon: Jettie Booze, MD;  Location: [facility];  Service: Cardiovascular;  Laterality: N/A;   HYDROCELE EXCISION Right 05/15/2003   INTRAVASCULAR LITHOTRIPSY  08/25/2021   Procedure: INTRAVASCULAR LITHOTRIPSY;  Surgeon: Jettie Booze, MD;  Location: [facility];  Service: Cardiovascular;;   INTRAVASCULAR ULTRASOUND/IVUS N/A 08/25/2021   Procedure: Intravascular Ultrasound/IVUS;  Surgeon: Jettie Booze, MD;  Location: [facility];  Service: Cardiovascular;  Laterality: N/A;   IR ANGIOGRAM VISCERAL SELECTIVE  02/16/2019   IR ANGIOGRAM VISCERAL SELECTIVE  02/16/2019   IR ANGIOGRAM VISCERAL SELECTIVE  02/16/2019   IR US GUIDE VASC ACCESS RIGHT  02/16/2019   LEFT HEART CATH AND CORONARY ANGIOGRAPHY N/A 07/28/2021   Procedure: LEFT HEART CATH AND CORONARY ANGIOGRAPHY;  Surgeon: Jettie Booze, MD;  Location: [facility];  Service: Cardiovascular;  Laterality: N/A;   LEFT HEART CATH AND CORONARY ANGIOGRAPHY N/A 08/25/2021   Procedure: LEFT HEART CATH AND CORONARY ANGIOGRAPHY;  Surgeon: Jettie Booze, MD;  Location: [facility];  Service: Cardiovascular;  Laterality: N/A;   ROTATOR CUFF REPAIR     SPERMATOCELECTOMY Right 05/15/2003   Family History  Problem Relation Age of Onset   Cancer Brother    Outpatient Medications Prior to Visit  Medication Sig Dispense Refill   naproxen (NAPROSYN) 500 MG tablet Take 500 mg by mouth 2 (two) times daily.     Ascorbic Acid (VITAMIN C) 1000 MG tablet Take 1,000 mg by mouth 2 (two) times a week.     aspirin EC 81 MG tablet Take 1 tablet (81 mg total) by mouth daily. Swallow whole. 90  tablet 3   latanoprost (XALATAN) 0.005 % ophthalmic solution Place 1 drop into both eyes at bedtime. 7.5 mL 1   MAGNESIUM PO Take 400 mg by mouth at bedtime.     metoprolol succinate (TOPROL XL) 25 MG 24 hr tablet Take 1 tablet (25 mg total) by mouth daily. 90 tablet 3   Multiple Vitamin (MULTIVITAMIN WITH MINERALS) TABS tablet Take 1 tablet by mouth daily.     nitroGLYCERIN (NITROSTAT) 0.4 MG SL tablet Place 1 tablet (0.4 mg total) under the tongue every 5 (five) minutes as needed for chest pain. 25 tablet 3   Olopatadine-Mometasone (RYALTRIS) 665-25 MCG/ACT SUSP Place 1-2 sprays into the nose in the morning and at bedtime. 29 g 5   pantoprazole (PROTONIX) 40 MG tablet Take 1 tablet (40 mg total) by mouth daily. 30 tablet 11   Polyvinyl Alcohol-Povidone (REFRESH OP) Place 1 drop into the left eye 2 (two) times daily as needed (dryness).     psyllium (METAMUCIL) 58.6 % powder Take 1 packet by mouth daily.     rosuvastatin (CRESTOR) 20 MG tablet Take 1 tablet (20 mg total) by mouth daily. 30 tablet 1   tadalafil (CIALIS) 20 MG tablet Take 1 tablet (20  mg total) by mouth daily as needed for erectile dysfunction. 30 tablet 2   tamsulosin (FLOMAX) 0.4 MG CAPS capsule Take 1 capsule (0.4 mg total) by mouth daily. 90 capsule 3   valsartan (DIOVAN) 320 MG tablet Take 1 tablet (320 mg total) by mouth daily. 90 tablet 3   No facility-administered medications prior to visit.   No Known Allergies    Objective:   Today's Vitals   10/26/22 0907  BP: (!) 179/105  Pulse: 80  Temp: (!) 97.4 F (36.3 C)  TempSrc: Temporal  SpO2: 97%  Weight: 174 lb 6.4 oz (79.1 kg)  Height: 5' 1.5" (1.562 m)   Body mass index is 32.42 kg/m.   General: Well developed, well nourished. No acute distress. Psych: Alert and oriented. Normal mood and affect.  Health Maintenance Due  Topic Date Due   Zoster Vaccines- Shingrix (1 of 2) Never done   Medicare Annual Wellness (AWV)  11/16/2019     Assessment & Plan:    1. Essential hypertension Mr. Mitchelle's blood pressure is elevated today. He has had a trend to mildly high blood pressures. I will add HCTZ to his current regimen of valsartan 320 mg daily and metoprolol succinate 25 mg daily. I will plan to see him back in 5 weeks to reassess his BP.  - hydrochlorothiazide (HYDRODIURIL) 25 MG tablet; Take 1 tablet (25 mg total) by mouth daily.  Dispense: 90 tablet; Refill: 3  2. HFrEF (heart failure with reduced ejection fraction) (HCC) Compensated. Continue daily aspirin, metoprolol succinate 25 mg daily and valsartan 320 mg daily.  3. Hyperlipidemia, unspecified hyperlipidemia type We will reassess lipids today, as it has been almost a year since his last check. Continue rosuvastatin 20 mg daily.  - Lipid panel  4. Hiatal hernia Certainly, any action that increases abdominal pressure will tend to exacerbate pressure/discomfort associated with a hiatal hernia. I advised him to avoid large meals for several hours before engaging in physical activity.  5. Chronic left-sided low back pain without sciatica The pain he considers his hip, is more int he left SI joint area. As he has not responded to PT, I will have him seen by orthopedics to further assess this. I will provide a small supply of tramadol for use while we are trying other means to reduce his pain.  - traMADol (ULTRAM) 50 MG tablet; Take 1 tablet (50 mg total) by mouth daily as needed.  Dispense: 15 tablet; Refill: 0 - Ambulatory referral to Orthopedic Surgery  6. Postviral fatigue syndrome Mr. Chowning's fatigue complaint is likely multifactorial, related to his heart disease, sleep apnea, and pulmonary fibrosis. 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 I will check some screening labs for other potential causes of fatigue.  - VITAMIN D 25 Hydroxy (Vit-D Deficiency, Fractures) - Vitamin B12 - TSH - CBC  Return in about 5 weeks (around 11/30/2022).   Haydee Salter, MD

## 2022-10-26 NOTE — Telephone Encounter (Signed)
Pt called and stated that he would like the prescription you are prescribing him please be sent to  [facility], [place] Phone: [phone]  Fax: [phone]

## 2022-10-27 ENCOUNTER — Ambulatory Visit: Payer: Medicare Other | Admitting: Physical Therapy

## 2022-10-27 ENCOUNTER — Ambulatory Visit: Payer: Self-pay

## 2022-10-27 ENCOUNTER — Encounter: Payer: Self-pay | Admitting: Sports Medicine

## 2022-10-27 ENCOUNTER — Ambulatory Visit: Payer: Medicare Other | Admitting: Sports Medicine

## 2022-10-27 DIAGNOSIS — M533 Sacrococcygeal disorders, not elsewhere classified: Secondary | ICD-10-CM | POA: Diagnosis not present

## 2022-10-27 DIAGNOSIS — M545 Low back pain, unspecified: Secondary | ICD-10-CM

## 2022-10-27 DIAGNOSIS — G8929 Other chronic pain: Secondary | ICD-10-CM | POA: Diagnosis not present

## 2022-10-27 MED ORDER — HYDROCHLOROTHIAZIDE 25 MG PO TABS: 25.0000 mg | ORAL_TABLET | Freq: Every day | ORAL | 3 refills | Status: DC

## 2022-10-27 NOTE — Progress Notes (Addendum)
   Procedure Note  Patient: David Goodman             Date of Birth: 1941/03/31           MRN: [id number]             Visit Date: 10/27/2022  Procedures: Visit Diagnoses:  1. Low back pain, unspecified back pain laterality, unspecified chronicity, unspecified whether sciatica present   2. Chronic left SI joint pain   3. Chronic right SI joint pain    U/S-guided SI-joint injection, right   After discussion of risk/benefits/indications, informed verbal consent was obtained. A timeout was then performed. The patient was positioned in a prone position on exam room table with a pillow placed under the pelvis for mild hip flexion. The SI joint area was cleaned and prepped with betadine and alcohol swabs. Sterile ultrasound gel was applied and the ultrasound transducer was placed in an anatomic axial plane over the PSIS, then moved distally over the SI-joint. Using ultrasound guidance, a 22-gauge, 3.5" needle was inserted from a medial to lateral approach utilizing an in-plane approach and directed into the SI-joint. The SI-joint was then injected with a mixture of 4:1 lidocaine:depomedrol with visualization of the injectate flow into the SI-joint under ultrasound visualization. The patient tolerated the procedure well without immediate complications.  U/S-guided SI-joint injection, left   After discussion of risk/benefits/indications, informed verbal consent was obtained. A timeout was then performed. The patient was positioned in a prone position on exam room table with a pillow placed under the pelvis for mild hip flexion. The SI joint area was cleaned and prepped with betadine and alcohol swabs. Sterile ultrasound gel was applied and the ultrasound transducer was placed in an anatomic axial plane over the PSIS, then moved distally over the SI-joint. Using ultrasound guidance, a 22-gauge, 3.5" needle was inserted from a medial to lateral approach utilizing an in-plane approach and directed into the  SI-joint. The SI-joint was then injected with a mixture of 4:1 lidocaine:depomedrol with visualization of the injectate flow into the SI-joint under ultrasound visualization. The patient tolerated the procedure well without immediate complications.  - I evaluated the patient about 10 minutes post-injection and he had vast improvement in pain and range of motion --> reported "no pain at all." - follow-up with Dr. Laurance Flatten as indicated; I am happy to see them as needed  Elba Barman, DO [place]  This note was dictated using Dragon naturally speaking software and may contain errors in syntax, spelling, or content which have not been identified prior to signing this note.

## 2022-10-27 NOTE — Telephone Encounter (Signed)
RX sent to [facility] as requested.  Dm/cma

## 2022-11-04 NOTE — Telephone Encounter (Signed)
Referral has been placed to [facility]. [address], [place] [address] [phone] Joylene Igo)  Called and left message for patient to discuss.

## 2022-11-09 ENCOUNTER — Ambulatory Visit: Payer: Medicare Other | Admitting: Interventional Cardiology

## 2022-11-14 DIAGNOSIS — G4733 Obstructive sleep apnea (adult) (pediatric): Secondary | ICD-10-CM | POA: Diagnosis not present

## 2022-11-15 ENCOUNTER — Ambulatory Visit: Payer: Medicare Other | Attending: Family Medicine | Admitting: Physical Therapy

## 2022-11-15 ENCOUNTER — Encounter: Payer: Self-pay | Admitting: Physical Therapy

## 2022-11-15 DIAGNOSIS — R262 Difficulty in walking, not elsewhere classified: Secondary | ICD-10-CM | POA: Diagnosis not present

## 2022-11-15 DIAGNOSIS — R278 Other lack of coordination: Secondary | ICD-10-CM

## 2022-11-15 DIAGNOSIS — R2681 Unsteadiness on feet: Secondary | ICD-10-CM | POA: Diagnosis not present

## 2022-11-15 DIAGNOSIS — M6281 Muscle weakness (generalized): Secondary | ICD-10-CM

## 2022-11-15 DIAGNOSIS — R293 Abnormal posture: Secondary | ICD-10-CM | POA: Diagnosis not present

## 2022-11-15 DIAGNOSIS — M545 Low back pain, unspecified: Secondary | ICD-10-CM | POA: Diagnosis not present

## 2022-11-15 NOTE — Therapy (Signed)
OUTPATIENT PHYSICAL THERAPY THORACOLUMBAR TREATMENT PHYSICAL THERAPY DISCHARGE SUMMARY  Visits from Start of Care: 10  Current functional level related to goals / functional outcomes: Excellent progress with most goals me   Remaining deficits: He wants to continue to progress with strength and balance.   Education / Equipment: HE{P   Patient agrees to discharge. Patient goals were partially met. Patient is being discharged due to being pleased with the current functional level.   Patient Name: David Goodman MRN: [id number] DOB:[birth year], 81 y.o., male Today's Date: 11/15/2022   PT End of Session - 11/15/22 1145     Visit Number 10    Date for PT Re-Evaluation 12/13/22    PT Start Time 9381    PT Stop Time 1215    PT Time Calculation (min) 30 min    Activity Tolerance Patient tolerated treatment well    Behavior During Therapy [facility] for tasks assessed/performed                  Past Medical History:  Diagnosis Date   ALLERGIC RHINITIS 07/13/2007   Aorta dilated on Echo; Normal sized Aorta on CT    Chest CT 1/23: No thoracic aortic aneurysm; coronary calcifications; aortic atherosclerosis; improved aeration of lungs with persistent minimal reticular opacities   CAD (coronary artery disease)    GERD (gastroesophageal reflux disease)    GI bleed    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) ([place])    HYPERLIPIDEMIA 07/13/2007   HYPERTENSION 07/13/2007   Impaired glucose tolerance 09/27/2016   Iron deficiency anemia due to chronic blood loss 02/27/2021   NEPHROLITHIASIS, 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   NSVT (nonsustained ventricular tachycardia) ([place])    PEPTIC ULCER DISEASE 07/13/2007   PVC's (premature ventricular contractions)    SVT (supraventricular tachycardia)    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   Past Surgical History:  Procedure Laterality Date   BROW LIFT Bilateral 11/10/2020   Procedure: BLEPHAROPLASTY;  Surgeon: [name], MD;   Location: [place];  Service: Plastics;  Laterality: Bilateral;  1 hour   COLONOSCOPY N/A 04/12/2022   Procedure: COLONOSCOPY;  Surgeon: Arta Silence, MD;  Location: [facility];  Service: Gastroenterology;  Laterality: N/A;   COLONOSCOPY WITH PROPOFOL N/A 02/17/2019   Procedure: COLONOSCOPY WITH PROPOFOL;  Surgeon: Ronnette Juniper, MD;  Location: [facility];  Service: Gastroenterology;  Laterality: N/A;   CORONARY STENT INTERVENTION N/A 08/25/2021   Procedure: CORONARY STENT INTERVENTION;  Surgeon: Jettie Booze, MD;  Location: [place] [facility];  Service: Cardiovascular;  Laterality: N/A;   HYDROCELE EXCISION Right 05/15/2003   INTRAVASCULAR LITHOTRIPSY  08/25/2021   Procedure: INTRAVASCULAR LITHOTRIPSY;  Surgeon: Jettie Booze, MD;  Location: [place] [facility];  Service: Cardiovascular;;   INTRAVASCULAR ULTRASOUND/IVUS N/A 08/25/2021   Procedure: Intravascular Ultrasound/IVUS;  Surgeon: Jettie Booze, MD;  Location: [place] [facility];  Service: Cardiovascular;  Laterality: N/A;   IR ANGIOGRAM VISCERAL SELECTIVE  02/16/2019   IR ANGIOGRAM VISCERAL SELECTIVE  02/16/2019   IR ANGIOGRAM VISCERAL SELECTIVE  02/16/2019   IR US GUIDE VASC ACCESS RIGHT  02/16/2019   LEFT HEART CATH AND CORONARY ANGIOGRAPHY N/A 07/28/2021   Procedure: LEFT HEART CATH AND CORONARY ANGIOGRAPHY;  Surgeon: Jettie Booze, MD;  Location: [place] [facility];  Service: Cardiovascular;  Laterality: N/A;   LEFT HEART CATH AND CORONARY ANGIOGRAPHY N/A 08/25/2021   Procedure: LEFT HEART CATH AND CORONARY ANGIOGRAPHY;  Surgeon: Jettie Booze, MD;  Location: [place] [facility];  Service: Cardiovascular;  Laterality: N/A;   ROTATOR CUFF REPAIR     SPERMATOCELECTOMY Right 05/15/2003   Patient Active Problem List   Diagnosis Date Noted   Hypertensive retinopathy 09/27/2022   Posterior vitreous detachment of right eye 09/27/2022   Spondylosis of lumbar spine 09/09/2022    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 09/09/2022   Gait instability 09/09/2022   Acute right-sided low back pain without sciatica 09/03/2022   Nonallergic rhinitis 08/04/2022   Iron deficiency anemia due to chronic blood loss 07/22/2022   Personal history of colonic polyps 07/22/2022   Gastro-esophageal reflux disease with esophagitis 07/22/2022   Gastric polyp 07/22/2022   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   GIB (gastrointestinal bleeding) 04/11/2022   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   Vivid dream 09/21/2021   Excessive daytime sleepiness 09/21/2021   Snoring 09/21/2021   Coronary artery disease involving native coronary artery of native heart 09/21/2021   SOBOE (shortness of breath on exertion) 09/21/2021   Coronary artery disease    HFrEF (heart failure with reduced ejection fraction) (HCC)    Bell's palsy, left 06/05/2021   Cerebrovascular disease 06/05/2021   Hiatal hernia 05/29/2021   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   Erectile dysfunction due to arterial insufficiency 02/27/2021   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 12/29/2020   Hemorrhoids 07/17/2020   Syncope 11/15/2018   Diverticulitis 05/25/2018   Osteoarthritis of right knee 12/13/2017   Osteoarthritis of left knee 12/13/2017   Testosterone deficiency 03/22/2017   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/27/2016   BPH (benign prostatic hyperplasia) 06/18/2013   HLD (hyperlipidemia) 07/13/2007   Essential hypertension 07/13/2007   Allergic rhinitis 07/13/2007   Peptic ulcer disease 07/13/2007   History of nephrolithiasis 07/13/2007    PCP: Haydee Salter, MD   REFERRING PROVIDER: Haydee Salter, MD   REFERRING DIAG: Diagnosis M47.816 (ICD-10-CM) - Spondylosis of lumbar spine M43.17 (ICD-10-CM) - 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 R26.81 (ICD-10-CM) - Gait instability   Rationale for Evaluation and Treatment  Rehabilitation  THERAPY DIAG:  Acute bilateral low back pain without sciatica  Muscle weakness (generalized)  Difficulty in walking, not elsewhere classified  Unsteadiness on feet  Abnormal posture  Other lack of coordination  ONSET DATE: 09/09/2022   SUBJECTIVE:                                                                                                                                                                                           SUBJECTIVE STATEMENT: " alittle shakey" pt denies falls but feels unsteady. Pain on left LB that comes and goes. Overall pt  states PT is helping. Pt sttaes struggling with hernia and makes him SOB at times  PERTINENT HISTORY:  Mr. Pokorski was seen 2 days prior to this with acute lower right back pain. He notes this continues to give him some difficulty. He denies any lower extremity weakness or numbness. He has had his wife massage his right lower back and this does help provide temporary relief. In addition, he notes that he has difficulty walking at times. He wonders if this is because of some of his medicines. He has some mild dizziness when he stands up, but has learned to pause until this resolves. He finds when he walks he tends to stagger some to one side.  Hiatal Hernia  PAIN:  Are you having pain? Yes: NPRS scale: 4/10 Pain location: low back,LEft LB Pain description: comes and goes Aggravating factors: Unknown Relieving factors: Naprosin helps, but he is limited in using it due to diverticulitis  PRECAUTIONS: None  WEIGHT BEARING RESTRICTIONS: No  FALLS:  Has patient fallen in last 6 months? Yes. 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   OBJECTIVE:   DIAGNOSTIC FINDINGS:  Lumbar x-ray: Spondylosis of L3-L5 with significant deformity of the L4 vertebrae.There appears to be some spondylolisthesis at the L5-S1 vertebrae.   1. Acute bilateral low back pain without sciatica 2. Spondylosis of lumbar spine 3. 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 Booher has significant arthritic changes in the lower lumbar spine. This is likely leading to muscular strain causing his current pain. As he did not tolerate a muscle relaxer. I recommend we focus more on physical therapy to try and get him some relief.   SCREENING FOR RED FLAGS: Bowel or bladder incontinence: No Spinal tumors: No Cauda equina syndrome: No Compression fracture: No Abdominal aneurysm: No  COGNITION: Overall cognitive status: Within functional limits for tasks assessed    MUSCLE LENGTH: Hamstrings: Right 71 deg; Left 65 deg Thomas test: B hip flexor tightness  POSTURE: rounded shoulders, forward head, decreased lumbar lordosis, and flexed trunk   PALPATION: TTP L lower lumbar paraspinals, Glut medius, R glut medius, with trigger point in the muscle.  LUMBAR ROM:  Lumbar AROM WNL in all planes except B rotation mildly limited B.  LOWER EXTREMITY ROM:    Generally WFL, but mildly stiff in all planes of hip movement.   LOWER EXTREMITY MMT:    MMT Right eval Left eval  Hip flexion 4- 4-  Hip extension 3+ 3+  Hip abduction 4- 3+  Hip adduction    Hip internal rotation    Hip external rotation    Knee flexion 4- 4-  Knee extension 4- 4-  Ankle dorsiflexion 4- 4-  Ankle plantarflexion    Ankle inversion    Ankle eversion     (Blank rows = not tested)  LUMBAR SPECIAL TESTS:  Straight leg raise test: Negative and Slump test: Negative  FUNCTIONAL TESTS:  5 times sit to stand: 14.4 Functional gait assessment: 15  GAIT: Distance walked: in clinic Assistive device utilized: None Level of  assistance: Complete Independence Comments: unsteady with lateral LOB to each side, slow.  TODAY'S TREATMENT:  DATE:  11/15/22 NuStep L5 x 6 minutes Functional status re-assessed for D/C Standing alternating step taps for balance, progressed to multiple taps with each foot Standing march for balance and abdominal strength Quick side to side steps for balance.  10/25/22 NuStep L5 x 6 minutes DN by Roma Schanz, PT to L piriformis and Glut medius Supine figure 4 stretch for L piriformis, 3 x 10 sec Supine glut stretch on L, 3 x 10 sec Supine bridge with g tband resistance 3x 10 Supine clamshells against g tband 3 x 10 Shoulder ER against G tband, 2 x 10 B side step against G tband, 4 x 5 steps Standing heel raises with BUE support  10/19/22 Nustep L 5 34mn 30# resisted gait fwd 5 x  and 4 x each side MinA with coming back all directions STS on airex 10 x CGA Fwd/backward and lateral stepping on/off airex  Min A Leg press 3 sets 12 feet 3 way 30# to increase hip strength Knee ext 2 sets 10 10# HS curl 2 sets 10 25#  Supine feet on ball bridge, KTC and obl 20 x Iso abdominals 15 x PROM/stretch LE and trunk    10/13/22 NuStep L5 x 6 minutes STM in supine using tennis ball under L gluts Supine passive stretch-HS with strap, abd/add with strap. 3 x 10 sec each Supine strengthening- Bridge over physioball x 10, Repeat with hip IR, Bridge and roll ball side to side x 5. Alternately lifting each leg off ball while stabilizing with opposite leg, 8 reps each leg. B KTC on ball x 10 reps Standing shoulder ext, rows, ER with G tband resistance, 2 x 10 for each. Pect stretch in doorway, 3 x 15 sec.  10/11/22 NuStep L5 x 6 minutes, then 2 x 3 Supine exercises- pelvic tilt (had difficulty achieving), Bridge x 10, repeat with hip IR x 10 reps. Roll KTC on ball, 2 x  10 reps Alternating leg lifts off physioball while stabilizing with opposite leg, 10 each Supine clamshells against Green Tband, 2 x 10 reps Sit to stand from elevated mat on airex pad, while holding 2# ball in BUE, and with G tband at knees to engage hips, 2 x 5 reps Heel raises, 2 x 10 on back bar.  10/06/22 Nustep L5 x6 min Hooklying trunk rotation x10 reps Sidelying hip abduction x10 reps Supine bridges 2x10 Seated pallof press yellow TB 2x10 reps Standing shoulder ext green TB 2x10 reps Lt SKTC 5x10 sec hold  Standing hip abd hold, extension hold 3x5 sec each side  Manual: trigger point release Lt glute med, piriformis   10/04/22 NuStep L5 x 6 min DN to lumb paraspinals BIL by MMichele McalpinePT Supine bridges Supine Hs stretch with band 3x10'' LE stretches Single K2C, Double K2C, Piriformis,  Standing shoulder Ext green, Rows blue 2x10 Alt 6in box taps    09/30/22 Nustep L 4 6 min Black tband trunk flex and ext 2 sets 10 Seated row and lat pull down 20# 2 sets 10 Supine core stab ex with cuing for core engagement, control and speed 15 min PROM and stretching LE and trunk=LE tightness BIL DN to lumb paraspinals BIL by MMichele McalpinePT   09/23/22 Bike L4 x550ms Stretching hamstrings, SK2C, and glutes Feet on pball rotations, knees to chest 2x10 Bridges 2x10 SLR 2x10 Supine single knee fallouts green 2x10 S2S 2x10 Leg ext 10# 2x10 HS curls 25# 2x10 Shoulder ext 10# 2x10    PATIENT EDUCATION:  Education details: POC Person educated: Patient Education method: Explanation Education comprehension: verbalized understanding  HOME EXERCISE PROGRAM: Access Code: [email or web address] URL: [email or web address] Date: 10/04/2022 Prepared by: Cheri Fowler   ASSESSMENT:  CLINICAL IMPRESSION: Patient reports that the injection he received in his back helped significantly. He feels ready for D/C. Functional status re-assessed and balance training added to his  HEP.  OBJECTIVE IMPAIRMENTS: Abnormal gait, decreased activity tolerance, decreased balance, decreased coordination, difficulty walking, decreased ROM, decreased strength, increased muscle spasms, impaired flexibility, and pain.   ACTIVITY LIMITATIONS: carrying, lifting, bending, squatting, and locomotion level  PARTICIPATION LIMITATIONS: meal prep, cleaning, shopping, community activity, and yard work  PERSONAL FACTORS: Age and 1 comorbidity: lumbar spondylolisthesis  are also affecting patient's functional outcome.   REHAB POTENTIAL: Good  CLINICAL DECISION MAKING: Stable/uncomplicated  EVALUATION COMPLEXITY: Low   GOALS: Goals reviewed with patient? Yes  SHORT TERM GOALS: Target date: 10/18/2022  I with basic HEP Baseline: Goal status: 09/30/22 MET  LONG TERM GOALS: Target date: 12/13/2022  I with final HEP Baseline:  Goal status: 10/19/22 on going  2.  Increase FGA score to at least 24 Baseline: 15, 12/18-19 Goal status: ongoing  3.  Decrease 5X STS to < 12 sec Baseline: 14.4 Goal status: met 10/19/22  12 sec  4.  Patient will tolerate at least 20 minutes on the bike or treadmill without increased back pain Baseline: Patient has stopped working out due to pain, 12/18- 20 minutes Goal status: on going 10/19/22, 12/18- met  PLAN:  PT FREQUENCY: 2x/week  PT DURATION: 12 weeks  PLANNED INTERVENTIONS: Therapeutic exercises, Therapeutic activity, Neuromuscular re-education, Balance training, Gait training, Patient/Family education, Self Care, Joint mobilization, Stair training, Dry Needling, Cryotherapy, Moist heat, Ionotophoresis 81m/ml Dexamethasone, and Manual therapy.  PLAN FOR NEXT SESSION: How is the hip pain?, HEP?, need to cont therapy?    Patient Details  Name: JAnav LammertMRN: [id number] of Birth: [birth year]Referring Provider:  RHaydee Salter MD  SEthel RanaDPT 11/15/22  [place] [address]Phone: [phone]  Fax:  [address] [place] [address]Phone: [phone]  Fax:  [phone]

## 2022-11-17 ENCOUNTER — Ambulatory Visit: Payer: Medicare Other | Admitting: Physical Therapy

## 2022-11-18 ENCOUNTER — Ambulatory Visit: Payer: Medicare Other | Admitting: Physical Therapy

## 2022-12-01 DIAGNOSIS — M170 Bilateral primary osteoarthritis of knee: Secondary | ICD-10-CM | POA: Diagnosis not present

## 2022-12-10 ENCOUNTER — Telehealth: Payer: Self-pay

## 2022-12-10 NOTE — Patient Outreach (Signed)
  Care Coordination   Initial Visit Note   12/10/2022 Name: Minh Roanhorse MRN: [id number] DOB: 24-Jun-1941  Mohannad Olivero is a 82 y.o. year old male who sees Rudd, Lillette Boxer, MD for primary care. I spoke with  Mannie Stabile by phone today.  What matters to the patients health and wellness today?  none    Goals Addressed             This Visit's Progress    COMPLETED: Care Coordination Activities-No follow up required       Care Coordination Interventions: Advised patient to Annual Wellness exam. Discussed [facility] services and support. Assessed SDOH. Advised to discuss with primary care physician if services needed in the future.         SDOH assessments and interventions completed:  Yes  SDOH Interventions Today    Flowsheet Row Most Recent Value  SDOH Interventions   Housing Interventions Intervention Not Indicated  Utilities Interventions Intervention Not Indicated        Care Coordination Interventions:  Yes, provided   Follow up plan: No further intervention required.   Encounter Outcome:  Pt. Visit Completed   Jone Baseman, RN, MSN [facility] Care Management Coordinator Direct Line [phone]

## 2022-12-10 NOTE — Patient Instructions (Signed)
Visit Information  Thank you for taking time to visit with me today. Please don't hesitate to contact me if I can be of assistance to you.   Following are the goals we discussed today:   Goals Addressed             This Visit's Progress    COMPLETED: Care Coordination Activities-No follow up required       Care Coordination Interventions: Advised patient to Annual Wellness exam. Discussed [facility] services and support. Assessed SDOH. Advised to discuss with primary care physician if services needed in the future.         If you are experiencing a Mental Health or Behavioral Health Crisis or need someone to talk to, please call the Suicide and Crisis Lifeline: 988   Patient verbalizes understanding of instructions and care plan provided today and agrees to view in MyChart. Active MyChart status and patient understanding of how to access instructions and care plan via MyChart confirmed with patient.     No further follow up required: decline  Mustaf Antonacci J Dakotah Orrego, RN, MSN [facility] Care Management Care Management Coordinator Direct Line [phone]     

## 2022-12-15 DIAGNOSIS — G4733 Obstructive sleep apnea (adult) (pediatric): Secondary | ICD-10-CM | POA: Diagnosis not present

## 2022-12-16 ENCOUNTER — Ambulatory Visit: Payer: Medicare Other

## 2022-12-16 DIAGNOSIS — I10 Essential (primary) hypertension: Secondary | ICD-10-CM

## 2022-12-16 DIAGNOSIS — R0981 Nasal congestion: Secondary | ICD-10-CM | POA: Insufficient documentation

## 2022-12-16 DIAGNOSIS — I5020 Unspecified systolic (congestive) heart failure: Secondary | ICD-10-CM

## 2022-12-16 DIAGNOSIS — J342 Deviated nasal septum: Secondary | ICD-10-CM | POA: Diagnosis not present

## 2022-12-16 NOTE — Progress Notes (Signed)
Care Management & Coordination Services Pharmacy Note  12/16/2022 Name:  David Goodman MRN:  [id number] DOB:  06-Aug-1941  Summary: Patient presents for follow-up consult.   Entresto was switched to Valsartan due to cost concerns. He did not qualify for PAP last year, but his financial situation has changed for 2024.   Home blood pressures controlled at home, but elevated in office. Patient reports ongoing dizziness with postural changes or bending over.   Recommendations/Changes made from today's visit: Continue current medications   Follow up plan: CPP follow-up 3 months   Subjective: David Goodman is an 82 y.o. year old male who is a primary patient of Rudd, Lillette Boxer, MD.  The care coordination team was consulted for assistance with disease management and care coordination needs.    Engaged with patient by telephone for follow up visit.  Recent office visits: 10/26/22: Patient presented to Dr. Gena Fray for follow-up.   Recent consult visits: 12/16/22: Patient presented to Dr. Sallee Provencal (ENT) 10/27/22: Patient presented to Dr. Rolena Infante (Sports Medicine) for back pain.  09/13/22: Patient presented to Leanor Kail, PA  09/08/22: Patient presented to Frann Rider, NP (Neurology) for OSA.   Hospital visits: None in previous 6 months   Objective:  Lab Results  Component Value Date   CREATININE 1.05 09/05/2022   BUN 24 (H) 09/05/2022   GFR 78.54 04/22/2022   EGFR 79 08/10/2022   GFRNONAA >60 09/05/2022   GFRAA >60 07/18/2020   NA 140 09/05/2022   K 4.5 09/05/2022   CALCIUM 8.9 09/05/2022   CO2 31 09/05/2022   GLUCOSE 96 09/05/2022    Lab Results  Component Value Date/Time   HGBA1C 5.6 02/27/2021 09:50 AM   HGBA1C 5.2 07/17/2020 02:12 AM   GFR 78.54 04/22/2022 10:30 AM   GFR 81.92 07/10/2021 10:16 AM    Last diabetic Eye exam:  Lab Results  Component Value Date/Time   HMDIABEYEEXA No Retinopathy 09/14/2022 12:00 AM    Last diabetic Foot exam: No results found  for: "HMDIABFOOTEX"   Lab Results  Component Value Date   CHOL 121 10/26/2022   HDL 43.30 10/26/2022   LDLCALC 44 10/26/2022   LDLDIRECT 129.0 02/27/2021   TRIG 170.0 (H) 10/26/2022   CHOLHDL 3 10/26/2022       Latest Ref Rng & Units 04/16/2022    2:55 AM 04/12/2022    8:51 AM 04/12/2022   12:46 AM  Hepatic Function  Total Protein 6.5 - 8.1 g/dL 4.5  4.3  4.5   Albumin 3.5 - 5.0 g/dL 2.7  2.7  2.7   AST 15 - 41 U/L '16  14  16   '$ ALT 0 - 44 U/L '13  14  14   '$ Alk Phosphatase 38 - 126 U/L '27  29  30   '$ Total Bilirubin 0.3 - 1.2 mg/dL 1.0  1.2  1.3     Lab Results  Component Value Date/Time   TSH 2.00 10/26/2022 10:18 AM   TSH 4.93 05/12/2022 09:52 AM   FREET4 1.04 05/12/2022 09:52 AM       Latest Ref Rng & Units 10/26/2022   10:18 AM 09/05/2022    3:12 PM 05/21/2022    3:35 PM  CBC  WBC 4.0 - 10.5 K/uL 10.6  6.4  9.7   Hemoglobin 13.0 - 17.0 g/dL 13.1  12.2  12.0   Hematocrit 39.0 - 52.0 % 40.0  38.2  36.3   Platelets 150.0 - 400.0 K/uL 313.0  250  289  Lab Results  Component Value Date/Time   VD25OH 43.12 10/26/2022 10:18 AM   VD25OH 36.47 05/02/2019 08:18 AM   VITAMINB12 >1500 (H) 10/26/2022 10:18 AM   VITAMINB12 856 07/30/2020 02:06 PM    Clinical ASCVD: No  The ASCVD Risk score (Arnett DK, et al., 2019) failed to calculate for the following reasons:   The 2019 ASCVD risk score is only valid for ages 84 to 61        12/10/2022   10:45 AM 10/26/2022    9:44 AM 07/22/2022   10:19 AM  Depression screen PHQ 2/9  Decreased Interest 0 0 0  Down, Depressed, Hopeless 0 0 0  PHQ - 2 Score 0 0 0  Altered sleeping   0  Tired, decreased energy   1  Change in appetite   0  Feeling bad or failure about yourself    0  Trouble concentrating   0  Moving slowly or fidgety/restless   0  Suicidal thoughts   0  PHQ-9 Score   1  Difficult doing work/chores   Not difficult at all     Social History   Tobacco Use  Smoking Status Never  Smokeless Tobacco Never   BP  Readings from Last 3 Encounters:  10/26/22 (!) 192/88  10/26/22 (!) 162/100  10/18/22 (!) 142/84   Pulse Readings from Last 3 Encounters:  10/26/22 79  10/26/22 80  10/18/22 77   Wt Readings from Last 3 Encounters:  10/26/22 175 lb (79.4 kg)  10/26/22 174 lb 6.4 oz (79.1 kg)  10/18/22 178 lb 11.2 oz (81.1 kg)   BMI Readings from Last 3 Encounters:  10/26/22 32.53 kg/m  10/26/22 32.42 kg/m  10/18/22 33.22 kg/m    No Known Allergies  Medications Reviewed Today     Reviewed by Jon Billings, RN (Case Manager) on 12/10/22 at 1045  Med List Status: <None>   Medication Order Taking? Sig Documenting Provider Last Dose Status Informant  Ascorbic Acid (VITAMIN C) 1000 MG tablet [id number] No Take 1,000 mg by mouth 2 (two) times a week. [provider] Taking Active Self, Pharmacy Records           Med Note Jacinta Shoe, Imogene Burn Apr 11, 2022  3:04 PM)    aspirin EC 81 MG tablet [id number] No Take 1 tablet (81 mg total) by mouth daily. Swallow whole. Charlie Pitter, PA-C Taking Active   hydrochlorothiazide (HYDRODIURIL) 25 MG tablet [id number]  Take 1 tablet (25 mg total) by mouth daily. Haydee Salter, MD  Active   latanoprost (XALATAN) 0.005 % ophthalmic solution [id number] No Place 1 drop into both eyes at bedtime. Marletta Lor, MD Taking Active Self, Pharmacy Records  MAGNESIUM PO [id number] No Take 400 mg by mouth at bedtime. [provider] Taking Active Self, Pharmacy Records  metoprolol succinate (TOPROL XL) 25 MG 24 hr tablet [id number] No Take 1 tablet (25 mg total) by mouth daily. Liliane Shi, PA-C Taking Active Self, Pharmacy Records  Multiple Vitamin (MULTIVITAMIN WITH MINERALS) TABS tablet [id number] No Take 1 tablet by mouth daily. [provider] Taking Active Self, Pharmacy Records           Med Note (CRUTHIS, Imogene Burn Apr 11, 2022  2:17 PM)    naproxen (NAPROSYN) 500 MG tablet [id number]  Take 500 mg by mouth 2 (two) times daily.  [provider]  Active   nitroGLYCERIN (NITROSTAT) 0.4 MG SL tablet [id number] No  Place 1 tablet (0.4 mg total) under the tongue every 5 (five) minutes as needed for chest pain. Jettie Booze, MD Taking Active Self, Pharmacy Records  Olopatadine-Mometasone [place]) 665-25 MCG/ACT Elizbeth Squires [id number]  Place 1-2 sprays into the nose in the morning and at bedtime. Garnet Sierras, DO  Active   pantoprazole (PROTONIX) 40 MG tablet [id number] No Take 1 tablet (40 mg total) by mouth daily. Jettie Booze, MD Taking Active   Polyvinyl Alcohol-Povidone [facility]) [id number] No Place 1 drop into the left eye 2 (two) times daily as needed (dryness). [provider] Taking Active Self, Pharmacy Records  psyllium (METAMUCIL) 58.6 % powder [id number] No Take 1 packet by mouth daily. [provider] Taking Active Self, Pharmacy Records  rosuvastatin (CRESTOR) 20 MG tablet [id number] No Take 1 tablet (20 mg total) by mouth daily. Haydee Salter, MD Taking Active   tadalafil (CIALIS) 20 MG tablet [id number] No Take 1 tablet (20 mg total) by mouth daily as needed for erectile dysfunction. Isaac Bliss, Rayford Halsted, MD Taking Active Self, Pharmacy Records  tamsulosin [facility]) 0.4 MG CAPS capsule [id number] No Take 1 capsule (0.4 mg total) by mouth daily. Haydee Salter, MD Taking Active   valsartan (DIOVAN) 320 MG tablet [id number] No Take 1 tablet (320 mg total) by mouth daily. Jettie Booze, MD Taking Active             SDOH:  (Social Determinants of Health) assessments and interventions performed: Yes SDOH Interventions    Flowsheet Row Telephone from 12/10/2022 in [place] Management from 12/17/2021 in [place] Visit from 05/29/2021 in [place] Management from 01/05/2021 in [place] at [place] Interventions  Intervention Not Indicated -- -- --  Transportation Interventions -- -- -- Intervention Not Indicated  Utilities Interventions Intervention Not Indicated -- -- --  Depression Interventions/Treatment  -- -- Medication --  Financial Strain Interventions -- Intervention Not Indicated -- Intervention Not Indicated      [place]: Low Risk  (12/10/2022)  Transportation Needs: No Transportation Needs (01/15/2021)  Utilities: Not At Risk (12/10/2022)  Alcohol Screen: Low Risk  (11/15/2018)  Depression (PHQ2-9): Low Risk  (12/10/2022)  Financial Resource Strain: Low Risk  (12/18/2021)  Tobacco Use: Low Risk  (12/10/2022)    Medication Assistance: None required.  Patient affirms current coverage meets needs.  Medication Access: Within the past 30 days, how often has patient missed a dose of medication? None Is a pillbox or other method used to improve adherence? Yes  Factors that may affect medication adherence? no barriers identified Are meds synced by current pharmacy? No  Are meds delivered by current pharmacy? Yes  Does patient experience delays in picking up medications due to transportation concerns? No   [facility] Services Reviewed: Is patient disadvantaged to use [facility]?: Yes  Current Rx insurance plan: [facility] Name and location of Current pharmacy:  [facility] ([place], [place] [address] [place] [address] Phone: [phone] Fax: [phone]  [place], [place] [address] Phone: [phone] Fax: [phone]  [place], [place] [address] [place] [address] Phone: [phone] Fax: [phone]  [facility] [place], [place] - [address] [place] [id number] Phone: [phone] Fax:  [phone]  [facility] services reviewed with patient today?: No  Patient requests to transfer care to [facility]?: No  Reason patient declined to change pharmacies: Disadvantaged due to insurance/mail order  Compliance/Adherence/Medication fill history: Care Gaps: Shingrix Awv Covid  Star-Rating Drugs: Valsartan 320 mg  mg last filled 10/22/22 for 90-DS at [facility].  Rosuvastatin 20 mg last filled 07/05/2022 90 day supply at [facility].   Assessment/Plan  Heart Failure (Goal: manage symptoms and prevent exacerbations) -Controlled -Last ejection fraction: 40% (Date: 12/09/21) -HF type: HFmrEF (mildly reduced EF 41-49%) -NYHA Class: II (slight limitation of activity) -Current treatment: HCTZ 25 mg daily  Metoprolol XL 25 mg daily Valsartan 320 mg daily  -Medications previously tried: Ship broker (Cost)   -Current home BP/HR readings: 139/59,  - Reports hypotensive symptoms: Dizziness when bending over.  -Recommended to continue current medication  Hyperlipidemia: (LDL goal < 70) -Controlled -Current treatment: Rosuvastatin 20 mg daily  -Current treatment: Aspirin 81 mg daily  -Medications previously tried: NA  -Recommended to continue current medication  Depression/Anxiety (Goal: Maintain symptom remission) -Controlled -Current treatment:  None -Medications previously tried/failed: Paroxetine -PHQ9: 0 -Recommended to continue current medication  GERD (Goal: Prevent heartburn) -Controlled -Current treatment  Pantoprazole 40 mg daily: Appropriate, Effective, Safe, Accessible  -Medications previously tried: Prilosec -Recommended to continue current medication  BPH (Goal: Minimize urinary symptoms) -Controlled -Current treatment  Tamsulosin 0.4 mg daily: Appropriate, Effective, Safe, Accessible  -Medications previously tried: NA  -Recommended to continue current medication  Allergic Rhinitis (Goal: Prevent allergy  symptoms) -Controlled -Current treatment  Cetirizine 10 mg daily: Appropriate, Effective, Safe, Accessible  Flonase 67mg/act 1 spray twice daily: Appropriate, Effective, Safe, Accessible  -Medications previously tried: NA  -Recommended to continue current medication  AJunius Argyle PharmD, BPara March CPP Clinical Pharmacist Practitioner  [facility] [phone]

## 2022-12-20 ENCOUNTER — Ambulatory Visit: Payer: Medicare Other | Admitting: Allergy

## 2022-12-21 DIAGNOSIS — J3489 Other specified disorders of nose and nasal sinuses: Secondary | ICD-10-CM | POA: Diagnosis not present

## 2022-12-21 DIAGNOSIS — R0981 Nasal congestion: Secondary | ICD-10-CM | POA: Diagnosis not present

## 2022-12-21 DIAGNOSIS — J342 Deviated nasal septum: Secondary | ICD-10-CM | POA: Diagnosis not present

## 2022-12-27 ENCOUNTER — Ambulatory Visit: Payer: Medicare Other | Admitting: Allergy

## 2022-12-29 ENCOUNTER — Telehealth: Payer: Self-pay | Admitting: Family Medicine

## 2022-12-29 NOTE — Telephone Encounter (Signed)
Left message for patient to call back and schedule Medicare Annual Wellness Visit (AWV) either virtually or in office. Left  my David Goodman number [phone]   Last AWV 11/15/18 please schedule with Nurse Health Adviser   45 min for awv-i  in office appointments 30 min for awv-s & awv-i phone/virtual appointments

## 2023-01-10 ENCOUNTER — Ambulatory Visit (INDEPENDENT_AMBULATORY_CARE_PROVIDER_SITE_OTHER): Payer: Medicare Other

## 2023-01-10 VITALS — BP 138/80 | HR 72 | Temp 98.1°F | Ht 64.0 in | Wt 176.8 lb

## 2023-01-10 DIAGNOSIS — Z0000 Encounter for general adult medical examination without abnormal findings: Secondary | ICD-10-CM | POA: Diagnosis not present

## 2023-01-10 NOTE — Patient Instructions (Addendum)
David Goodman , Thank you for taking time to come for your Medicare Wellness Visit. I appreciate your ongoing commitment to your health goals. Please review the following plan we discussed and let me know if I can assist you in the future.   These are the goals we discussed:  Goals      Patient Stated     01/10/2023, wants to lose weight and get legs strong     Track and Manage Fluids and Swelling-Heart Failure     Timeframe:  Long-Range Goal Priority:  High Start Date: 12/18/21                            Expected End Date: 12/18/22                      Follow Up within 90 days   - call office if I gain more than 2 pounds in one day or 5 pounds in one week - keep legs up while sitting - use salt in moderation - watch for swelling in feet, ankles and legs every day - weigh myself daily    Why is this important?   It is important to check your weight daily and watch how much salt and liquids you have.  It will help you to manage your heart failure.    Notes:         This is a list of the screening recommended for you and due dates:  Health Maintenance  Topic Date Due   Zoster (Shingles) Vaccine (1 of 2) Never done   COVID-19 Vaccine (5 - 2023-24 season) 07/30/2022   Medicare Annual Wellness Visit  01/11/2024   DTaP/Tdap/Td vaccine (3 - Td or Tdap) 05/18/2025   Pneumonia Vaccine  Completed   Flu Shot  Completed   HPV Vaccine  Aged Out    Advanced directives: Please bring a copy of your POA (Power of Green) and/or Living Will to your next appointment.   Conditions/risks identified: none  Next appointment: Follow up in one year for your annual wellness visit.   Preventive Care 82 Years and Older, Male  Preventive care refers to lifestyle choices and visits with your health care provider that can promote health and wellness. What does preventive care include? A yearly physical exam. This is also called an annual well check. Dental exams once or twice a year. Routine eye  exams. Ask your health care provider how often you should have your eyes checked. Personal lifestyle choices, including: Daily care of your teeth and gums. Regular physical activity. Eating a healthy diet. Avoiding tobacco and drug use. Limiting alcohol use. Practicing safe sex. Taking low doses of aspirin every day. Taking vitamin and mineral supplements as recommended by your health care provider. What happens during an annual well check? The services and screenings done by your health care provider during your annual well check will depend on your age, overall health, lifestyle risk factors, and family history of disease. Counseling  Your health care provider may ask you questions about your: Alcohol use. Tobacco use. Drug use. Emotional well-being. Home and relationship well-being. Sexual activity. Eating habits. History of falls. Memory and ability to understand (cognition). Work and work Statistician. Screening  You may have the following tests or measurements: Height, weight, and BMI. Blood pressure. Lipid and cholesterol levels. These may be checked every 5 years, or more frequently if you are over 32 years old. Skin  check. Lung cancer screening. You may have this screening every year starting at age 25 if you have a 30-pack-year history of smoking and currently smoke or have quit within the past 15 years. Fecal occult blood test (FOBT) of the stool. You may have this test every year starting at age 91. Flexible sigmoidoscopy or colonoscopy. You may have a sigmoidoscopy every 5 years or a colonoscopy every 10 years starting at age 47. Prostate cancer screening. Recommendations will vary depending on your family history and other risks. Hepatitis C blood test. Hepatitis B blood test. Sexually transmitted disease (STD) testing. Diabetes screening. This is done by checking your blood sugar (glucose) after you have not eaten for a while (fasting). You may have this done every  1-3 years. Abdominal aortic aneurysm (AAA) screening. You may need this if you are a current or former smoker. Osteoporosis. You may be screened starting at age 68 if you are at high risk. Talk with your health care provider about your test results, treatment options, and if necessary, the need for more tests. Vaccines  Your health care provider may recommend certain vaccines, such as: Influenza vaccine. This is recommended every year. Tetanus, diphtheria, and acellular pertussis (Tdap, Td) vaccine. You may need a Td booster every 10 years. Zoster vaccine. You may need this after age 57. Pneumococcal 13-valent conjugate (PCV13) vaccine. One dose is recommended after age 4. Pneumococcal polysaccharide (PPSV23) vaccine. One dose is recommended after age 39. Talk to your health care provider about which screenings and vaccines you need and how often you need them. This information is not intended to replace advice given to you by your health care provider. Make sure you discuss any questions you have with your health care provider. Document Released: 12/12/2015 Document Revised: 08/04/2016 Document Reviewed: 09/16/2015 Elsevier Interactive Patient Education  2017 [place] Prevention in the Home Falls can cause injuries. They can happen to people of all ages. There are many things you can do to make your home safe and to help prevent falls. What can I do on the outside of my home? Regularly fix the edges of walkways and driveways and fix any cracks. Remove anything that might make you trip as you walk through a door, such as a raised step or threshold. Trim any bushes or trees on the path to your home. Use bright outdoor lighting. Clear any walking paths of anything that might make someone trip, such as rocks or tools. Regularly check to see if handrails are loose or broken. Make sure that both sides of any steps have handrails. Any raised decks and porches should have guardrails on  the edges. Have any leaves, snow, or ice cleared regularly. Use sand or salt on walking paths during winter. Clean up any spills in your garage right away. This includes oil or grease spills. What can I do in the bathroom? Use night lights. Install grab bars by the toilet and in the tub and shower. Do not use towel bars as grab bars. Use non-skid mats or decals in the tub or shower. If you need to sit down in the shower, use a plastic, non-slip stool. Keep the floor dry. Clean up any water that spills on the floor as soon as it happens. Remove soap buildup in the tub or shower regularly. Attach bath mats securely with double-sided non-slip rug tape. Do not have throw rugs and other things on the floor that can make you trip. What can I do in the  bedroom? Use night lights. Make sure that you have a light by your bed that is easy to reach. Do not use any sheets or blankets that are too big for your bed. They should not hang down onto the floor. Have a firm chair that has side arms. You can use this for support while you get dressed. Do not have throw rugs and other things on the floor that can make you trip. What can I do in the kitchen? Clean up any spills right away. Avoid walking on wet floors. Keep items that you use a lot in easy-to-reach places. If you need to reach something above you, use a strong step stool that has a grab bar. Keep electrical cords out of the way. Do not use floor polish or wax that makes floors slippery. If you must use wax, use non-skid floor wax. Do not have throw rugs and other things on the floor that can make you trip. What can I do with my stairs? Do not leave any items on the stairs. Make sure that there are handrails on both sides of the stairs and use them. Fix handrails that are broken or loose. Make sure that handrails are as long as the stairways. Check any carpeting to make sure that it is firmly attached to the stairs. Fix any carpet that is loose  or worn. Avoid having throw rugs at the top or bottom of the stairs. If you do have throw rugs, attach them to the floor with carpet tape. Make sure that you have a light switch at the top of the stairs and the bottom of the stairs. If you do not have them, ask someone to add them for you. What else can I do to help prevent falls? Wear shoes that: Do not have high heels. Have rubber bottoms. Are comfortable and fit you well. Are closed at the toe. Do not wear sandals. If you use a stepladder: Make sure that it is fully opened. Do not climb a closed stepladder. Make sure that both sides of the stepladder are locked into place. Ask someone to hold it for you, if possible. Clearly mark and make sure that you can see: Any grab bars or handrails. First and last steps. Where the edge of each step is. Use tools that help you move around (mobility aids) if they are needed. These include: Canes. Walkers. Scooters. Crutches. Turn on the lights when you go into a dark area. Replace any light bulbs as soon as they burn out. Set up your furniture so you have a clear path. Avoid moving your furniture around. If any of your floors are uneven, fix them. If there are any pets around you, be aware of where they are. Review your medicines with your doctor. Some medicines can make you feel dizzy. This can increase your chance of falling. Ask your doctor what other things that you can do to help prevent falls. This information is not intended to replace advice given to you by your health care provider. Make sure you discuss any questions you have with your health care provider. Document Released: 09/11/2009 Document Revised: 04/22/2016 Document Reviewed: 12/20/2014 Elsevier Interactive Patient Education  2017 [employer].

## 2023-01-10 NOTE — Progress Notes (Signed)
Subjective:   David Goodman is a 82 y.o. male who presents for Medicare Annual/Subsequent preventive examination.  Review of Systems     Cardiac Risk Factors include: advanced age (>53mn, >>59women);dyslipidemia;hypertension;male gender;obesity (BMI >30kg/m2)     Objective:    Today's Vitals   01/10/23 1046  BP: 138/80  Pulse: 72  Temp: 98.1 F (36.7 C)  TempSrc: Oral  SpO2: 95%  Weight: 176 lb 12.8 oz (80.2 kg)  Height: 5' 4"$  (1.626 m)   Body mass index is 30.35 kg/m.     01/10/2023   11:05 AM 09/05/2022    3:11 PM 08/25/2021    5:37 AM 07/28/2021    7:57 AM 06/04/2021    7:15 AM 11/10/2020    7:05 AM 11/04/2020   12:04 PM  Advanced Directives  Does Patient Have a Medical Advance Directive? Yes No Yes Yes No Yes Yes  Type of [place]amed[place] will  Living will Living will  [place] will   Does patient want to make changes to medical advance directive?   No - Patient declined   No - Patient declined No - Patient declined  Copy of [place] Chart? No - copy requested     No - copy requested   Would patient like information on creating a medical advance directive?  No - Patient declined   No - Patient declined      Current Medications (verified) Outpatient Encounter Medications as of 01/10/2023  Medication Sig   Ascorbic Acid (VITAMIN C) 1000 MG tablet Take 1,000 mg by mouth 2 (two) times a week.   aspirin EC 81 MG tablet Take 1 tablet (81 mg total) by mouth daily. Swallow whole.   hydrochlorothiazide (HYDRODIURIL) 25 MG tablet Take 1 tablet (25 mg total) by mouth daily.   latanoprost (XALATAN) 0.005 % ophthalmic solution Place 1 drop into both eyes at bedtime.   MAGNESIUM PO Take 400 mg by mouth at bedtime.   Multiple Vitamin (MULTIVITAMIN WITH MINERALS) TABS tablet Take 1 tablet by mouth daily.   nitroGLYCERIN (NITROSTAT) 0.4 MG SL tablet Place 1 tablet (0.4 mg total) under the tongue  every 5 (five) minutes as needed for chest pain.   Olopatadine-Mometasone (RYALTRIS) [id number]/ACT SUSP Place 1-2 sprays into the nose in the morning and at bedtime.   pantoprazole (PROTONIX) 40 MG tablet Take 1 tablet (40 mg total) by mouth daily.   Polyvinyl Alcohol-Povidone (REFRESH OP) Place 1 drop into the left eye 2 (two) times daily as needed (dryness).   psyllium (METAMUCIL) 58.6 % powder Take 1 packet by mouth daily.   rosuvastatin (CRESTOR) 20 MG tablet Take 1 tablet (20 mg total) by mouth daily.   tamsulosin (FLOMAX) 0.4 MG CAPS capsule Take 1 capsule (0.4 mg total) by mouth daily.   valsartan (DIOVAN) 320 MG tablet Take 1 tablet (320 mg total) by mouth daily.   metoprolol succinate (TOPROL XL) 25 MG 24 hr tablet Take 1 tablet (25 mg total) by mouth daily.   naproxen (NAPROSYN) 500 MG tablet Take 500 mg by mouth 2 (two) times daily. (Patient not taking: Reported on 01/10/2023)   tadalafil (CIALIS) 20 MG tablet Take 1 tablet (20 mg total) by mouth daily as needed for erectile dysfunction. (Patient not taking: Reported on 01/10/2023)   No facility-administered encounter medications on file as of 01/10/2023.    Allergies (verified) Patient has no known allergies.   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) ([employer])    HYPERLIPIDEMIA 07/13/2007   HYPERTENSION 07/13/2007   Impaired glucose tolerance 09/27/2016   Iron deficiency anemia due to chronic blood loss 02/27/2021   NEPHROLITHIASIS, 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   NSVT (nonsustained ventricular tachycardia) ([place])    PEPTIC ULCER DISEASE 07/13/2007    PVC's (premature ventricular contractions)    SVT (supraventricular tachycardia)    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   Past Surgical History:  Procedure Laterality Date   BROW LIFT Bilateral 11/10/2020   Procedure: BLEPHAROPLASTY;  Surgeon: [name], MD;  Location: [place];  Service: Plastics;  Laterality: Bilateral;  1 hour   COLONOSCOPY N/A 04/12/2022   Procedure: COLONOSCOPY;  Surgeon: Arta Silence, MD;  Location: [facility];  Service: Gastroenterology;  Laterality: N/A;   COLONOSCOPY WITH PROPOFOL N/A 02/17/2019   Procedure: COLONOSCOPY WITH PROPOFOL;  Surgeon: Ronnette Juniper, MD;  Location: [facility];  Service: Gastroenterology;  Laterality: N/A;   CORONARY STENT INTERVENTION N/A 08/25/2021   Procedure: CORONARY STENT INTERVENTION;  Surgeon: Jettie Booze, MD;  Location: [place] [facility];  Service: Cardiovascular;  Laterality: N/A;   HYDROCELE EXCISION Right 05/15/2003   INTRAVASCULAR LITHOTRIPSY  08/25/2021   Procedure: INTRAVASCULAR LITHOTRIPSY;  Surgeon: Jettie Booze, MD;  Location: [place] [facility];  Service: Cardiovascular;;   INTRAVASCULAR ULTRASOUND/IVUS N/A 08/25/2021   Procedure: Intravascular Ultrasound/IVUS;  Surgeon: Jettie Booze, MD;  Location: [place] [facility];  Service: Cardiovascular;  Laterality: N/A;   IR ANGIOGRAM VISCERAL SELECTIVE  02/16/2019   IR ANGIOGRAM VISCERAL SELECTIVE  02/16/2019   IR ANGIOGRAM VISCERAL SELECTIVE  02/16/2019   IR US GUIDE VASC ACCESS RIGHT  02/16/2019   LEFT HEART CATH AND CORONARY ANGIOGRAPHY N/A 07/28/2021   Procedure: LEFT HEART CATH AND CORONARY ANGIOGRAPHY;  Surgeon: Jettie Booze, MD;  Location: [place] [facility];  Service: Cardiovascular;  Laterality: N/A;   LEFT HEART CATH AND CORONARY ANGIOGRAPHY N/A 08/25/2021   Procedure: LEFT HEART CATH AND CORONARY ANGIOGRAPHY;  Surgeon: Jettie Booze, MD;  Location: [place] [facility];  Service: Cardiovascular;   Laterality: N/A;   ROTATOR CUFF REPAIR     SPERMATOCELECTOMY Right 05/15/2003   Family History  Problem Relation Age of Onset   Cancer Brother    Social History   Socioeconomic History   Marital status: Married    Spouse name: Not on file   Number of children: Not on file   Years of education: Not on file   Highest education level: Not on file  Occupational History   Not on file  Tobacco Use   Smoking status: Never   Smokeless tobacco: Never  Vaping Use   Vaping Use: Never used  Substance and Sexual Activity   Alcohol use: Yes    Comment: couple times a week - wine   Drug use: No   Sexual activity: Not Currently  Other Topics Concern   Not on file  Social History Narrative   Lives alone   Right handed   Caffeine: 1 cup of coffee a day   Social Determinants of Health  Financial Resource Strain: Low Risk  (01/10/2023)   Overall Financial Resource Strain (CARDIA)    Difficulty of Paying Living Expenses: Not hard at all  Food Insecurity: No Food Insecurity (01/10/2023)   Hunger Vital Sign    Worried About Running Out of Food in the Last Year: Never true    Ran Out of Food in the Last Year: Never true  Transportation Needs: No Transportation Needs (01/10/2023)   PRAPARE - Hydrologist (Medical): No    Lack of Transportation (Non-Medical): No  Physical Activity: Insufficiently Active (01/10/2023)   Exercise Vital Sign    Days of Exercise per Week: 3 days    Minutes of Exercise per Session: 40 min  Stress: No Stress Concern Present (01/10/2023)   [place]    Feeling of Stress : Not at all  Social Connections: Not on file    Tobacco Counseling Counseling given: Not Answered   Clinical Intake:  Pre-visit preparation completed: Yes  Pain : No/denies pain     Nutritional Status: BMI > 30  Obese Nutritional Risks: None Diabetes: No  How often do you need to have  someone help you when you read instructions, pamphlets, or other written materials from your doctor or pharmacy?: 1 - Never  Diabetic? no  Interpreter Needed?: No  Information entered by :: NAllen LPN   Activities of Daily Living    01/10/2023   11:06 AM 01/08/2023   10:37 AM  In your present state of health, do you have any difficulty performing the following activities:  Hearing? 1 0  Comment has hearing aids   Vision? 0 0  Difficulty concentrating or making decisions? 1 0  Walking or climbing stairs? 1 1  Dressing or bathing? 0 0  Doing errands, shopping? 0   Preparing Food and eating ? N N  Using the Toilet? N N  In the past six months, have you accidently leaked urine? N N  Do you have problems with loss of bowel control? N N  Managing your Medications? N N  Managing your Finances? N N  Housekeeping or managing your Housekeeping? N N    Patient Care Team: Haydee Salter, MD as PCP - General (Family Medicine) Jettie Booze, MD as PCP - Cardiology (Cardiology) Germaine Pomfret, [facility] as Pharmacist (Pharmacist) Jettie Booze, MD as Consulting Physician (Cardiology) Hortencia Pilar, MD as Consulting Physician (Surgery)  Indicate any recent Medical Services you may have received from other than [facility] providers in the past year (date may be approximate).     Assessment:   This is a routine wellness examination for [place].  Hearing/Vision screen Vision Screening - Comments:: Regular eye exams, [facility]  Dietary issues and exercise activities discussed: Current Exercise Habits: Home exercise routine, Type of exercise: Other - see comments;calisthenics (stationary bike), Time (Minutes): 40, Frequency (Times/Week): 3, Weekly Exercise (Minutes/Week): 120   Goals Addressed             This Visit's Progress    Patient Stated       01/10/2023, wants to lose weight and get legs strong       Depression Screen    01/10/2023   11:06 AM  12/10/2022   10:45 AM 10/26/2022    9:44 AM 07/22/2022   10:19 AM 04/22/2022   10:02 AM 05/29/2021    9:11 AM 05/29/2021    8:32 AM  PHQ 2/9 Scores  PHQ - 2 Score 0 0 0 0 1 4 4  $ PHQ- 9 Score    1 4 15 15    $ Fall Risk    01/10/2023   11:06 AM 01/08/2023   10:37 AM 10/26/2022    9:44 AM 07/22/2022   10:18 AM 05/29/2021    8:28 AM  Fall Risk   Falls in the past year? 0 0 0 1 0  Number falls in past yr: 0  0 0 0  Injury with Fall? 0  0 0 0  Risk for fall due to : Medication side effect   History of fall(s)   Follow up Falls prevention discussed;Education provided;Falls evaluation completed   Falls evaluation completed     FALL RISK PREVENTION PERTAINING TO THE HOME:  Any stairs in or around the home? No  If so, are there any without handrails? N/a Home free of loose throw rugs in walkways, pet beds, electrical cords, etc? Yes  Adequate lighting in your home to reduce risk of falls? Yes   ASSISTIVE DEVICES UTILIZED TO PREVENT FALLS:  Life alert? No  Use of a cane, walker or w/c? No  Grab bars in the bathroom? Yes  Shower chair or bench in shower? Yes  Elevated toilet seat or a handicapped toilet? No   TIMED UP AND GO:  Was the test performed? Yes .  Length of time to ambulate 10 feet: 7 sec.   Gait slow and steady without use of assistive device  Cognitive Function:        01/10/2023   11:08 AM  6CIT Screen  What Year? 0 points  What month? 0 points  What time? 0 points  Count back from 20 0 points  Months in reverse 0 points  Repeat phrase 4 points  Total Score 4 points    Immunizations Immunization History  Administered Date(s) Administered   Fluad Quad(high Dose 65+) 08/10/2019, 09/03/2022   Influenza, High Dose Seasonal PF 07/31/2015, 09/27/2016, 10/10/2017, 09/30/2018   Influenza,inj,quad, With Preservative 09/30/2018   Influenza-Unspecified 09/14/2017, 09/30/2018, 10/28/2020   PFIZER(Purple Top)SARS-COV-2 Vaccination 01/21/2020, 02/22/2020, 10/28/2020    Pfizer Covid-19 Vaccine Bivalent Booster 22yr & up 11/10/2021   Pneumococcal Conjugate-13 05/19/2015   Pneumococcal Polysaccharide-23 07/16/2009, 09/30/2018   Pneumococcal-Unspecified 09/30/2018   Td 07/16/2009   Tdap 05/19/2015   Zoster, Live 04/19/2011    TDAP status: Up to date  Flu Vaccine status: Up to date  Pneumococcal vaccine status: Up to date  Covid-19 vaccine status: Completed vaccines  Qualifies for Shingles Vaccine? Yes   Zostavax completed Yes   Shingrix Completed?: No.    Education has been provided regarding the importance of this vaccine. Patient has been advised to call insurance company to determine out of pocket expense if they have not yet received this vaccine. Advised may also receive vaccine at local pharmacy or Health Dept. Verbalized acceptance and understanding.  Screening Tests Health Maintenance  Topic Date Due   Zoster Vaccines- Shingrix (1 of 2) Never done   COVID-19 Vaccine (5 - 2023-24 season) 07/30/2022   Medicare Annual Wellness (AWV)  01/11/2024   DTaP/Tdap/Td (3 - Td or Tdap) 05/18/2025   Pneumonia Vaccine 82 Years old  Completed   INFLUENZA VACCINE  Completed   HPV VACCINES  Aged Out    Health Maintenance  Health Maintenance Due  Topic Date Due   Zoster Vaccines- Shingrix (1 of 2) Never done   COVID-19 Vaccine (5 - 2023-24 season) 07/30/2022    Colorectal cancer screening:  No longer required.   Lung Cancer Screening: (Low Dose CT Chest recommended if Age 34-80 years, 30 pack-year currently smoking OR have quit w/in 15years.) does not qualify.   Lung Cancer Screening Referral: no  Additional Screening:  Hepatitis C Screening: does qualify  Vision Screening: Recommended annual ophthalmology exams for early detection of glaucoma and other disorders of the eye. Is the patient up to date with their annual eye exam?  Yes  Who is the provider or what is the name of the office in which the patient attends annual eye exams? [facility] If pt is not established with a provider, would they like to be referred to a provider to establish care? No .   Dental Screening: Recommended annual dental exams for proper oral hygiene  Community Resource Referral / Chronic Care Management: CRR required this visit?  No   CCM required this visit?  No      Plan:     I have personally reviewed and noted the following in the patient's chart:   Medical and social history Use of alcohol, tobacco or illicit drugs  Current medications and supplements including opioid prescriptions. Patient is not currently taking opioid prescriptions. Functional ability and status Nutritional status Physical activity Advanced directives List of other physicians Hospitalizations, surgeries, and ER visits in previous 12 months Vitals Screenings to include cognitive, depression, and falls Referrals and appointments  In addition, I have reviewed and discussed with patient certain preventive protocols, quality metrics, and best practice recommendations. A written personalized care plan for preventive services as well as general preventive health recommendations were provided to patient.     Kellie Simmering, LPN   [id number]   Nurse Notes: none

## 2023-01-15 DIAGNOSIS — G4733 Obstructive sleep apnea (adult) (pediatric): Secondary | ICD-10-CM | POA: Diagnosis not present

## 2023-01-19 DIAGNOSIS — G4733 Obstructive sleep apnea (adult) (pediatric): Secondary | ICD-10-CM | POA: Diagnosis not present

## 2023-01-22 ENCOUNTER — Other Ambulatory Visit: Payer: Self-pay | Admitting: Family Medicine

## 2023-01-22 DIAGNOSIS — E785 Hyperlipidemia, unspecified: Secondary | ICD-10-CM

## 2023-01-27 ENCOUNTER — Ambulatory Visit: Payer: Medicare Other | Admitting: Interventional Cardiology

## 2023-02-04 ENCOUNTER — Emergency Department (HOSPITAL_BASED_OUTPATIENT_CLINIC_OR_DEPARTMENT_OTHER): Payer: Medicare Other

## 2023-02-04 ENCOUNTER — Other Ambulatory Visit: Payer: Self-pay

## 2023-02-04 ENCOUNTER — Emergency Department (HOSPITAL_BASED_OUTPATIENT_CLINIC_OR_DEPARTMENT_OTHER)
Admission: EM | Admit: 2023-02-04 | Discharge: 2023-02-04 | Disposition: A | Discharge status: Home / Self Care | Payer: Medicare Other | Attending: Emergency Medicine | Admitting: Emergency Medicine

## 2023-02-04 ENCOUNTER — Encounter (HOSPITAL_BASED_OUTPATIENT_CLINIC_OR_DEPARTMENT_OTHER): Payer: Self-pay | Admitting: *Deleted

## 2023-02-04 DIAGNOSIS — S6991XA Unspecified injury of right wrist, hand and finger(s), initial encounter: Secondary | ICD-10-CM | POA: Diagnosis present

## 2023-02-04 DIAGNOSIS — Y93E5 Activity, floor mopping and cleaning: Secondary | ICD-10-CM | POA: Insufficient documentation

## 2023-02-04 DIAGNOSIS — W260XXA Contact with knife, initial encounter: Secondary | ICD-10-CM | POA: Diagnosis not present

## 2023-02-04 DIAGNOSIS — S61011A Laceration without foreign body of right thumb without damage to nail, initial encounter: Secondary | ICD-10-CM | POA: Insufficient documentation

## 2023-02-04 DIAGNOSIS — Z7982 Long term (current) use of aspirin: Secondary | ICD-10-CM | POA: Insufficient documentation

## 2023-02-04 MED ORDER — LIDOCAINE-EPINEPHRINE-TETRACAINE (LET) TOPICAL GEL
3.0000 mL | Freq: Once | TOPICAL | Status: AC
  Administered 2023-02-04: 1.2 mL via TOPICAL
  Filled 2023-02-04: qty 3

## 2023-02-04 NOTE — ED Notes (Signed)
Dsg applied to rt thumb laceration site. Pt teaching done re: S/S of infection and importance of returning to ED for suture removal as outlined by the ED provider. AVS provided to client as well. Opportunity for questions provided prior to DC to home

## 2023-02-04 NOTE — Discharge Instructions (Addendum)
You were seen in the ED for a finger laceration. There was no evidence of a fracture or foreign body based on xray so laceration repair was performed with sutures. You should plan on having these sutures in place for the next 7-10 days depending on how it is healing. If you experience any signs of infection, please return to the ED or follow up with your primary care provider for further evaluation.

## 2023-02-04 NOTE — ED Triage Notes (Signed)
Laceration to Rt thumb last night, was cleaning a new knife and rec laceration. Arrived with dsg/ bandage in place, bleeding is currently controlled. Has normal sensation

## 2023-02-04 NOTE — ED Provider Notes (Signed)
 [place] [facility] Provider Note   CSN: [id number] Arrival date & time: 02/04/23  5643     History Chief Complaint  Patient presents with   Finger Injury    David Goodman is a 82 y.o. male.  Patient presents to the emergency department following a finger injury reports this occurred approximately 8 or 9 PM last night and was able to obtain adequate bleeding control at home prior to arriving here.  He believes is up-to-date on tetanus as his doctor has not recently advised him that he needs a booster. Based on his chart, appears that last Tdap was in 2016.  Denies any loss of finger mobility or function.  No loss of sensation.  Not currently on blood thinners.  HPI     Home Medications Prior to Admission medications   Medication Sig Start Date End Date Taking? Authorizing Provider  Ascorbic Acid (VITAMIN C) 1000 MG tablet Take 1,000 mg by mouth 2 (two) times a week.    [provider]  aspirin EC 81 MG tablet Take 1 tablet (81 mg total) by mouth daily. Swallow whole. 04/28/22   Dunn, Tacey Ruiz, PA-C  hydrochlorothiazide (HYDRODIURIL) 25 MG tablet Take 1 tablet (25 mg total) by mouth daily. 10/27/22   Loyola Mast, MD  latanoprost (XALATAN) 0.005 % ophthalmic solution Place 1 drop into both eyes at bedtime. 06/20/17   Gordy Savers, MD  MAGNESIUM PO Take 400 mg by mouth at bedtime.    [provider]  metoprolol succinate (TOPROL XL) 25 MG 24 hr tablet Take 1 tablet (25 mg total) by mouth daily. 01/01/22 01/01/23  Tereso Newcomer T, PA-C  Multiple Vitamin (MULTIVITAMIN WITH MINERALS) TABS tablet Take 1 tablet by mouth daily.    [provider]  naproxen (NAPROSYN) 500 MG tablet Take 500 mg by mouth 2 (two) times daily. Patient not taking: Reported on 01/10/2023 07/28/22   [provider]  nitroGLYCERIN (NITROSTAT) 0.4 MG SL tablet Place 1 tablet (0.4 mg total) under the tongue every 5 (five) minutes as needed for  chest pain. 07/22/21   Corky Crafts, MD  Olopatadine-Mometasone Cristal Generous) [phone] MCG/ACT SUSP Place 1-2 sprays into the nose in the morning and at bedtime. 10/18/22   Ellamae Sia, DO  pantoprazole (PROTONIX) 40 MG tablet Take 1 tablet (40 mg total) by mouth daily. 05/04/22   Corky Crafts, MD  Polyvinyl Alcohol-Povidone (REFRESH OP) Place 1 drop into the left eye 2 (two) times daily as needed (dryness).    [provider]  psyllium (METAMUCIL) 58.6 % powder Take 1 packet by mouth daily.    [provider]  rosuvastatin (CRESTOR) 20 MG tablet Take 1 tablet by mouth once daily 01/24/23   Loyola Mast, MD  tadalafil (CIALIS) 20 MG tablet Take 1 tablet (20 mg total) by mouth daily as needed for erectile dysfunction. Patient not taking: Reported on 01/10/2023 03/06/20   Philip Aspen, Limmie Patricia, MD  tamsulosin (FLOMAX) 0.4 MG CAPS capsule Take 1 capsule (0.4 mg total) by mouth daily. 09/27/22   Loyola Mast, MD  valsartan (DIOVAN) 320 MG tablet Take 1 tablet (320 mg total) by mouth daily. 07/27/22   Corky Crafts, MD      Allergies    Patient has no known allergies.    Review of Systems   Review of Systems  Skin:  Positive for wound.  All other systems reviewed and are negative.   Physical  Exam Updated Vital Signs BP 135/72 (BP Location: Left Arm)   Pulse 74   Temp 97.8 F (36.6 C) (Oral)   Resp 16   SpO2 98%  Physical Exam Vitals and nursing note reviewed.  Constitutional:      General: He is not in acute distress.    Appearance: Normal appearance. He is not ill-appearing.  HENT:     Head: Normocephalic and atraumatic.  Eyes:     Conjunctiva/sclera: Conjunctivae normal.  Pulmonary:     Effort: Pulmonary effort is normal.     Breath sounds: Normal breath sounds.  Abdominal:     General: Abdomen is flat.  Musculoskeletal:        General: No swelling or tenderness. Normal range of motion.     Comments: No loss of motor function of left  thumb  Skin:    General: Skin is warm.     Capillary Refill: Capillary refill takes less than 2 seconds.     Findings: Erythema and lesion present.     Comments: Skin flap on palmar aspect of right thumb without active bleeding  Neurological:     General: No focal deficit present.     Mental Status: He is alert. Mental status is at baseline.     Comments: No altered sensation in left thumb     ED Results / Procedures / Treatments   Labs (all labs ordered are listed, but only abnormal results are displayed) Labs Reviewed - No data to display  EKG None  Radiology No results found.  Procedures .Marland KitchenLaceration Repair  Date/Time: 02/07/2023 12:38 PM  Performed by: Smitty Knudsen, PA-C Authorized by: Smitty Knudsen, PA-C   Consent:    Consent obtained:  Verbal   Consent given by:  Patient   Risks, benefits, and alternatives were discussed: yes     Risks discussed:  Infection and pain   Alternatives discussed:  No treatment Universal protocol:    Patient identity confirmed:  Verbally with patient Anesthesia:    Anesthesia method:  Topical application   Topical anesthetic:  LET Laceration details:    Location:  Finger   Finger location:  R thumb   Length (cm):  4   Depth (mm):  2 Pre-procedure details:    Preparation:  Patient was prepped and draped in usual sterile fashion and imaging obtained to evaluate for foreign bodies Exploration:    Limited defect created (wound extended): yes     Hemostasis achieved with:  LET   Imaging outcome: foreign body not noted     Contaminated: no   Treatment:    Area cleansed with:  Saline and povidone-iodine   Amount of cleaning:  Standard   Irrigation solution:  Sterile saline   Irrigation volume:  200cc   Irrigation method:  Syringe   Debridement:  None   Undermining:  None Skin repair:    Repair method:  Sutures   Suture size:  5-0   Suture material:  Nylon   Number of sutures:  3 Approximation:    Approximation:   Loose Repair type:    Repair type:  Simple Post-procedure details:    Dressing:  Non-adherent dressing   Procedure completion:  Tolerated    Medications Ordered in ED Medications  lidocaine-EPINEPHrine-tetracaine (LET) topical gel (1.2 mLs Topical Given 02/04/23 0951)    ED Course/ Medical Decision Making/ A&P  Medical Decision Making Amount and/or Complexity of Data Reviewed Radiology: ordered.   This patient presents to the ED for concern of finger laceration.  Differential diagnosis includes laceration, dislocation, tuft fracture   Imaging Studies ordered:  I ordered imaging studies including right thumb x-ray I independently visualized and interpreted imaging which showed no acute fractures or dislocations I agree with the radiologist interpretation    Problem List / ED Course:  Patient presented emergency department complaints of right thumb pain.  He reports that he cut his finger last night/early this morning when he was shot sharp and knife.  He is able to obtain hemostasis prior to arriving to the emergency department using pressure.  Not currently on blood thinners or has any known anticoagulant.  No bleeding disorder.  Given presentation, x-ray performed which is largely reassuring without any signs of any deeper penetration into the bone or fracture dislocations.  Topical let gel was applied for approximately half an hour before suture pairs performed which was beneficial as out of anesthesia was obtained and bleeding was verbally controlled.  Patient is able to tolerate laceration repair without any significant concerns.  Advised patient that sutures should remain in place for the next 7 to 10 days depending on while the area is healing but to monitor for signs of infection including significant redness or swelling, drainage or discharge, or severe worsening pain.  Per patient's chart, patient last had Tdap given approximately 8 years ago.   Currently up-to-date on Tdap and not requiring booster today.  Patient is agreeable with plan and verbalized understanding all return precautions.  All questions answered prior to patient discharge.  Final Clinical Impression(s) / ED Diagnoses Final diagnoses:  Laceration of right thumb without foreign body without damage to nail, initial encounter    Rx / DC Orders ED Discharge Orders     None         Smitty Knudsen, PA-C 02/07/23 1242    Mardene Sayer, MD 02/09/23 [phone]

## 2023-02-04 NOTE — ED Notes (Signed)
Suture Cart to bedside 

## 2023-02-04 NOTE — ED Notes (Signed)
LET APPLIED TO RT THUMB

## 2023-02-08 ENCOUNTER — Ambulatory Visit (INDEPENDENT_AMBULATORY_CARE_PROVIDER_SITE_OTHER): Payer: Medicare Other | Admitting: Family Medicine

## 2023-02-08 ENCOUNTER — Encounter: Payer: Self-pay | Admitting: Family Medicine

## 2023-02-08 VITALS — BP 122/70 | HR 60 | Temp 97.8°F | Ht 64.0 in | Wt 174.8 lb

## 2023-02-08 DIAGNOSIS — J069 Acute upper respiratory infection, unspecified: Secondary | ICD-10-CM | POA: Diagnosis not present

## 2023-02-08 NOTE — Assessment & Plan Note (Addendum)
Discussed home care for viral illness, including rest, pushing fluids, and OTC medications as needed for symptom relief. He should try Afrin for a few days, esp. during his upcoming plane flight. Recommend hot tea with honey for sore throat symptoms. Follow-up if needed for worsening or persistent symptoms.

## 2023-02-08 NOTE — Progress Notes (Signed)
[facility] [address] [place] [address] Dept: [phone] Dept Fax: [phone]  Office Visit  Subjective:    [name], male    DOB: [birth year], 82 y.o..   MRN: [id number]  Chief Complaint  Patient presents with   Cough    C/o having cough, chest/sinus congestion, HA x 1 week.   Has been taking Tylenol cold/flu.   Negative home covid test.     History of Present Illness:  Patient is in today for with a 1-week history of non-productive cough, chest congestion, sinus congestion, headache, and weakness. He notes some mild dyspnea associated with this. He has been using Tylenol Cold & Flu to help with his symptoms. He did a home COVID test which was negative.  Mr. Montague has a history of CAD and HFrEF. He has not had increased swelling in his ankle. He is managed on metoprolol succinate 25 mg daily and valsartan 320 mg daily.  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   GIB (gastrointestinal bleeding) 04/11/2022   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   Vivid dream 09/21/2021   Excessive daytime sleepiness 09/21/2021   Snoring 09/21/2021   Coronary artery disease involving native coronary artery of native heart 09/21/2021   SOBOE (shortness of breath on exertion) 09/21/2021   Coronary artery disease    HFrEF (heart failure  with reduced ejection fraction) ([place])    Bell's palsy, left 06/05/2021   Cerebrovascular disease 06/05/2021   Hiatal hernia 05/29/2021   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   Erectile dysfunction due to arterial insufficiency 02/27/2021   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   Hemorrhoids 07/17/2020   Syncope 11/15/2018   Diverticulitis 05/25/2018   Osteoarthritis of right knee 12/13/2017   Osteoarthritis of left knee 12/13/2017   Testosterone deficiency 03/22/2017   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/27/2016   BPH (benign prostatic hyperplasia) 06/18/2013   HLD (hyperlipidemia) 07/13/2007   Essential hypertension 07/13/2007   Allergic rhinitis 07/13/2007   Peptic ulcer disease 07/13/2007   History of nephrolithiasis 07/13/2007   Past Surgical History:  Procedure Laterality Date   BROW LIFT Bilateral 11/10/2020   Procedure: BLEPHAROPLASTY;  Surgeon: [name], MD;  Location: [facility];  Service: Plastics;  Laterality: Bilateral;  1 hour   COLONOSCOPY N/A 04/12/2022   Procedure: COLONOSCOPY;  Surgeon: Arta Silence, MD;  Location: [facility];  Service: Gastroenterology;  Laterality: N/A;   COLONOSCOPY WITH PROPOFOL N/A 02/17/2019   Procedure: COLONOSCOPY WITH PROPOFOL;  Surgeon: Ronnette Juniper, MD;  Location: [facility];  Service: Gastroenterology;  Laterality: N/A;   CORONARY STENT INTERVENTION N/A 08/25/2021   Procedure: CORONARY STENT INTERVENTION;  Surgeon: Jettie Booze, MD;  Location: [facility];  Service: Cardiovascular;  Laterality: N/A;   HYDROCELE EXCISION Right 05/15/2003   INTRAVASCULAR LITHOTRIPSY  08/25/2021   Procedure: INTRAVASCULAR LITHOTRIPSY;  Surgeon: Irish Lack,  Charlann Lange, MD;  Location: [facility];  Service: Cardiovascular;;   INTRAVASCULAR ULTRASOUND/IVUS N/A 08/25/2021   Procedure: Intravascular Ultrasound/IVUS;  Surgeon: Jettie Booze, MD;  Location: [facility];  Service: Cardiovascular;  Laterality: N/A;   IR ANGIOGRAM VISCERAL SELECTIVE  02/16/2019   IR ANGIOGRAM VISCERAL SELECTIVE  02/16/2019   IR ANGIOGRAM VISCERAL SELECTIVE  02/16/2019   IR US GUIDE VASC ACCESS RIGHT  02/16/2019   LEFT HEART CATH AND CORONARY ANGIOGRAPHY N/A 07/28/2021   Procedure: LEFT HEART CATH AND CORONARY ANGIOGRAPHY;  Surgeon: Jettie Booze, MD;  Location: [facility];  Service: Cardiovascular;  Laterality: N/A;   LEFT HEART CATH AND CORONARY ANGIOGRAPHY N/A 08/25/2021   Procedure: LEFT HEART CATH AND CORONARY ANGIOGRAPHY;  Surgeon: Jettie Booze, MD;  Location: [facility];  Service: Cardiovascular;  Laterality: N/A;   ROTATOR CUFF REPAIR     SPERMATOCELECTOMY Right 05/15/2003   Family History  Problem Relation Age of Onset   Cancer Brother    Outpatient Medications Prior to Visit  Medication Sig Dispense Refill   Ascorbic Acid (VITAMIN C) 1000 MG tablet Take 1,000 mg by mouth 2 (two) times a week.     aspirin EC 81 MG tablet Take 1 tablet (81 mg total) by mouth daily. Swallow whole. 90 tablet 3   hydrochlorothiazide (HYDRODIURIL) 25 MG tablet Take 1 tablet (25 mg total) by mouth daily. 90 tablet 3   latanoprost (XALATAN) 0.005 % ophthalmic solution Place 1 drop into both eyes at bedtime. 7.5 mL 1   MAGNESIUM PO Take 400 mg by mouth at bedtime.     metoprolol succinate (TOPROL XL) 25 MG 24 hr tablet Take 1 tablet (25 mg total) by mouth daily. 90 tablet 3   Multiple Vitamin (MULTIVITAMIN WITH MINERALS) TABS tablet Take 1 tablet by mouth daily.     naproxen (NAPROSYN) 500 MG tablet Take 500 mg by mouth 2 (two) times daily.     nitroGLYCERIN (NITROSTAT) 0.4 MG SL tablet Place 1 tablet (0.4 mg total) under the tongue every 5 (five) minutes as needed for chest pain. 25 tablet 3   Olopatadine-Mometasone (RYALTRIS) 665-25 MCG/ACT SUSP Place 1-2 sprays into the nose in the morning and at bedtime. 29 g 5    pantoprazole (PROTONIX) 40 MG tablet Take 1 tablet (40 mg total) by mouth daily. 30 tablet 11   Polyvinyl Alcohol-Povidone (REFRESH OP) Place 1 drop into the left eye 2 (two) times daily as needed (dryness).     psyllium (METAMUCIL) 58.6 % powder Take 1 packet by mouth daily.     rosuvastatin (CRESTOR) 20 MG tablet Take 1 tablet by mouth once daily 90 tablet 3   tadalafil (CIALIS) 20 MG tablet Take 1 tablet (20 mg total) by mouth daily as needed for erectile dysfunction. 30 tablet 2   tamsulosin (FLOMAX) 0.4 MG CAPS capsule Take 1 capsule (0.4 mg total) by mouth daily. 90 capsule 3   valsartan (DIOVAN) 320 MG tablet Take 1 tablet (320 mg total) by mouth daily. 90 tablet 3   No facility-administered medications prior to visit.   No Known Allergies   Objective:   Today's Vitals   02/08/23 0917  BP: 122/70  Pulse: 60  Temp: 97.8 F (36.6 C)  TempSrc: Temporal  SpO2: 98%  Weight: 174 lb 12.8 oz (79.3 kg)  Height: '5\' 4"'$  (1.626 m)   Body mass index is 30 kg/m.   General: Well developed, well nourished. No  acute distress. HEENT: Normocephalic, non-traumatic. PERRL, EOMI. Conjunctiva clear. External ears normal. EAC and   TMs normal bilaterally. Nose clear without congestion or rhinorrhea. Mucous membranes moist.   Oropharynx clear. Good dentition. Neck: Supple. No lymphadenopathy. No thyromegaly. Lungs: Clear to auscultation bilaterally. No wheezing, rales or rhonchi. CV: RRR with occasional skip beats. No murmurs or rubs. Pulses 2+ bilaterally. Extremities: No edema noted. Psych: Alert and oriented. Normal mood and affect.  Health Maintenance Due  Topic Date Due   Zoster Vaccines- Shingrix (1 of 2) Never done   COVID-19 Vaccine (5 - 2023-24 season) 07/30/2022     Assessment & Plan:   Problem List Items Addressed This Visit       Respiratory   Viral URI with cough - Primary    Discussed home care for viral illness, including rest, pushing fluids, and OTC medications as  needed for symptom relief. He should try Afrin for a few days, esp. during his upcoming plane flight. Recommend hot tea with honey for sore throat symptoms. Follow-up if needed for worsening or persistent symptoms.        Return if symptoms worsen or fail to improve.   Haydee Salter, MD

## 2023-02-09 ENCOUNTER — Ambulatory Visit: Payer: Medicare Other | Attending: Physician Assistant | Admitting: Physician Assistant

## 2023-02-09 ENCOUNTER — Encounter: Payer: Self-pay | Admitting: Physician Assistant

## 2023-02-09 VITALS — BP 108/66 | HR 74 | Ht 64.0 in | Wt 173.0 lb

## 2023-02-09 DIAGNOSIS — I10 Essential (primary) hypertension: Secondary | ICD-10-CM | POA: Diagnosis not present

## 2023-02-09 DIAGNOSIS — E785 Hyperlipidemia, unspecified: Secondary | ICD-10-CM

## 2023-02-09 DIAGNOSIS — I340 Nonrheumatic mitral (valve) insufficiency: Secondary | ICD-10-CM | POA: Diagnosis not present

## 2023-02-09 DIAGNOSIS — I5020 Unspecified systolic (congestive) heart failure: Secondary | ICD-10-CM | POA: Diagnosis not present

## 2023-02-09 DIAGNOSIS — I2510 Atherosclerotic heart disease of native coronary artery without angina pectoris: Secondary | ICD-10-CM

## 2023-02-09 NOTE — Progress Notes (Signed)
Office Visit    Patient Name: David Goodman Date of Encounter: 02/09/2023  PCP:  Haydee Salter, MD   [place]  Cardiologist:  Larae Grooms, MD  Advanced Practice Provider:  No care team member to display Electrophysiologist:  None   HPI    David Goodman is a 82 y.o. male with a history of coronary artery disease (status post DES to proximal to mid LAD in 07/2021), heart failure with mildly reduced ejection fraction (echocardiogram 06/2239 to 45%, grade 1 DD, global HK, echo 11/2021 with LVEF 35 to 40%, started on Entresto.  Echocardiogram 03/2022 with LVEF 40 to 45%, grade 1 DD) see, moderate mitral regurgitation by echo 11/2021 and mild 03/2022, hypertension, hyperlipidemia, glucose intolerance, GERD/PUD, iron deficiency anemia, aortic atherosclerosis presents today for follow-up visit.  He states that [facility] the [facility] of [facility] he had to clean the knife and sliced his finger.  He got 3 stitches.  No cardiac issues at that time.  He missed his appointment with Dr. Emeterio Reeve [place].  Right now he has a cold and went to see his primary care yesterday.  He is taking over-the-counter medications and trying to stay hydrated.  Tested for flu and COVID and was negative. No chest pain. Allergies are his biggest issue. He went to see an ENT doc and nothing was found.  Reports no chest pain, pressure, or tightness. No edema, orthopnea, PND. Reports no palpitations.   Past Medical History    Past Medical History:  Diagnosis Date   ALLERGIC RHINITIS 07/13/2007   Aorta dilated on Echo; Normal sized Aorta on CT    Chest CT 1/23: No thoracic aortic aneurysm; coronary calcifications; aortic atherosclerosis; improved aeration of lungs with persistent minimal reticular opacities   CAD (coronary artery disease)    GERD (gastroesophageal reflux disease)    GI bleed    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) ([place])    HYPERLIPIDEMIA  07/13/2007   HYPERTENSION 07/13/2007   Impaired glucose tolerance 09/27/2016   Iron deficiency anemia due to chronic blood loss 02/27/2021   NEPHROLITHIASIS, 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   NSVT (nonsustained ventricular tachycardia) ([place])    PEPTIC ULCER DISEASE 07/13/2007   PVC's (premature ventricular contractions)    SVT (supraventricular tachycardia)    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   Past Surgical History:  Procedure Laterality Date   BROW LIFT Bilateral 11/10/2020   Procedure: BLEPHAROPLASTY;  Surgeon: [name], MD;  Location: [facility];  Service: Plastics;  Laterality: Bilateral;  1 hour   COLONOSCOPY N/A 04/12/2022   Procedure: COLONOSCOPY;  Surgeon: Arta Silence, MD;  [facility];  Service: Gastroenterology;  Laterality: N/A;   COLONOSCOPY WITH PROPOFOL N/A 02/17/2019   Procedure: COLONOSCOPY WITH PROPOFOL;  Surgeon: Ronnette Juniper, MD;  [facility];  Service: Gastroenterology;  Laterality: N/A;   CORONARY STENT INTERVENTION N/A 08/25/2021   Procedure: CORONARY STENT INTERVENTION;  Surgeon: Jettie Booze, MD;  Location: [facility];  Service: Cardiovascular;  Laterality: N/A;   HYDROCELE EXCISION Right 05/15/2003   INTRAVASCULAR LITHOTRIPSY  08/25/2021   Procedure: INTRAVASCULAR LITHOTRIPSY;  Surgeon: Jettie Booze, MD;  Location: [facility];  Service: Cardiovascular;;   INTRAVASCULAR ULTRASOUND/IVUS N/A 08/25/2021   Procedure: Intravascular Ultrasound/IVUS;  Surgeon: Jettie Booze, MD;  Location: [facility];  Service: Cardiovascular;  Laterality: N/A;   IR ANGIOGRAM VISCERAL SELECTIVE  02/16/2019   IR ANGIOGRAM  VISCERAL SELECTIVE  02/16/2019   IR ANGIOGRAM VISCERAL SELECTIVE  02/16/2019   IR US GUIDE VASC ACCESS RIGHT  02/16/2019   LEFT HEART CATH AND CORONARY ANGIOGRAPHY N/A 07/28/2021   Procedure: LEFT HEART CATH AND CORONARY ANGIOGRAPHY;  Surgeon: Jettie Booze, MD;  Location: [facility];  Service: Cardiovascular;  Laterality: N/A;   LEFT HEART CATH AND CORONARY ANGIOGRAPHY N/A 08/25/2021   Procedure: LEFT HEART CATH AND CORONARY ANGIOGRAPHY;  Surgeon: Jettie Booze, MD;  Location: [facility];  Service: Cardiovascular;  Laterality: N/A;   ROTATOR CUFF REPAIR     SPERMATOCELECTOMY Right 05/15/2003    Allergies  No Known Allergies EKGs/Labs/Other Studies Reviewed:   The following studies were reviewed today:  Monitor 11/2021 Normal sinus rhythm with rare SVT and NSVT. Frequent PVCs with rare PACs noted. No symptoms associated with premature beats. No atrial fibrillation.     Patch Wear Time:  2 days and 23 hours (2023-01-20T10:05:48-0500 to 2023-01-23T09:20:41-0500)   Patient had a min HR of 55 bpm, max HR of 226 bpm, and avg HR of 80 bpm. Predominant underlying rhythm was Sinus Rhythm. 14 Ventricular Tachycardia runs occurred, the run with the fastest interval lasting 4 beats with a max rate of 226 bpm, the longest  lasting 12 beats with an avg rate of 127 bpm. 10 Supraventricular Tachycardia runs occurred, the run with the fastest interval lasting 14 beats with a max rate of 184 bpm, the longest lasting 16 beats with an avg rate of 96 bpm. Isolated SVEs were rare  (<1.0%), SVE Couplets were rare (<1.0%), and SVE Triplets were rare (<1.0%). Isolated VEs were frequent (7.1%, 24152), VE Couplets were rare (<1.0%, 769), and VE Triplets were rare (<1.0%, 51). Ventricular Bigeminy and Trigeminy were present.   Echo 03/2022 IMPRESSIONS    1. Left ventricular ejection fraction, by estimation, is 40 to 45%. The left ventricle has mildly decreased function. The left ventricle demonstrates global hypokinesis. There is mild asymmetric left ventricular hypertrophy. Left ventricular diastolic parameters are consistent with Grade I diastolic dysfunction (impaired relaxation).  2. Right ventricular systolic function is normal. The right  ventricular size is normal.  3. The mitral valve is normal in structure. Mild mitral valve regurgitation. No evidence of mitral stenosis.  4. The aortic valve is tricuspid. Aortic valve regurgitation is not visualized. Aortic valve sclerosis/calcification is present, without any evidence of aortic stenosis.  5. The inferior vena cava is normal in size with greater than 50% respiratory variability, suggesting right atrial pressure of 3 mmHg.  Comparison(s): The left ventricular function has improved slighlty in 11/2021 was 35-40% now 40-45%.  FINDINGS  Left Ventricle: Left ventricular ejection fraction, by estimation, is 40 to 45%. The left ventricle has mildly decreased function. The left ventricle demonstrates global hypokinesis. The left ventricular internal cavity size was normal in size. There is  mild asymmetric left ventricular hypertrophy. Left ventricular diastolic parameters are consistent with Grade I diastolic dysfunction (impaired relaxation).  Right Ventricle: The right ventricular size is normal. No increase in right ventricular wall thickness. Right ventricular systolic function is normal.  Left Atrium: Left atrial size was normal in size.  Right Atrium: Right atrial size was normal in size.  Pericardium: There is no evidence of pericardial effusion. Presence of epicardial fat layer.  Mitral Valve: The mitral valve is normal in structure. Mild mitral valve regurgitation. No evidence of mitral valve stenosis.  Tricuspid Valve: The tricuspid valve is normal in structure. Tricuspid valve  regurgitation is trivial. No evidence of tricuspid stenosis.  Aortic Valve: The aortic valve is tricuspid. Aortic valve regurgitation is not visualized. Aortic valve sclerosis/calcification is present, without any evidence of aortic stenosis. Aortic valve mean gradient measures 8.0 mmHg. Aortic valve peak gradient measures 15.2 mmHg. Aortic valve area, by VTI measures 2.02  cm.  Pulmonic Valve: The pulmonic valve was normal in structure. Pulmonic valve regurgitation is not visualized. No evidence of pulmonic stenosis.  Aorta: The aortic root is normal in size and structure.  Venous: The inferior vena cava is normal in size with greater than 50% respiratory variability, suggesting right atrial pressure of 3 mmHg.  IAS/Shunts: No atrial level shunt detected by color flow Doppler.    EKG:  EKG is not ordered today.   Recent Labs: 04/16/2022: ALT 13 04/28/2022: Magnesium 2.3 09/05/2022: B Natriuretic Peptide 280.7; BUN 24; Creatinine, Ser 1.05; Potassium 4.5; Sodium 140 10/26/2022: Hemoglobin 13.1; Platelets 313.0; TSH 2.00  Recent Lipid Panel    Component Value Date/Time   CHOL 121 10/26/2022 1018   CHOL 114 12/09/2021 0838   TRIG 170.0 (H) 10/26/2022 1018   HDL 43.30 10/26/2022 1018   HDL 42 12/09/2021 0838   CHOLHDL 3 10/26/2022 1018   VLDL 34.0 10/26/2022 1018   LDLCALC 44 10/26/2022 1018   LDLCALC 41 12/09/2021 0838   LDLDIRECT 129.0 02/27/2021 0950    Home Medications   Current Meds  Medication Sig   Ascorbic Acid (VITAMIN C) 1000 MG tablet Take 1,000 mg by mouth 2 (two) times a week.   aspirin EC 81 MG tablet Take 1 tablet (81 mg total) by mouth daily. Swallow whole.   hydrochlorothiazide (HYDRODIURIL) 25 MG tablet Take 1 tablet (25 mg total) by mouth daily.   latanoprost (XALATAN) 0.005 % ophthalmic solution Place 1 drop into both eyes at bedtime.   MAGNESIUM PO Take 400 mg by mouth at bedtime.   metoprolol succinate (TOPROL XL) 25 MG 24 hr tablet Take 1 tablet (25 mg total) by mouth daily.   Multiple Vitamin (MULTIVITAMIN WITH MINERALS) TABS tablet Take 1 tablet by mouth daily.   naproxen (NAPROSYN) 500 MG tablet Take 500 mg by mouth 2 (two) times daily.   nitroGLYCERIN (NITROSTAT) 0.4 MG SL tablet Place 1 tablet (0.4 mg total) under the tongue every 5 (five) minutes as needed for chest pain.   Olopatadine-Mometasone (RYALTRIS) [id number]  MCG/ACT SUSP Place 1-2 sprays into the nose in the morning and at bedtime.   pantoprazole (PROTONIX) 40 MG tablet Take 1 tablet (40 mg total) by mouth daily.   Polyvinyl Alcohol-Povidone (REFRESH OP) Place 1 drop into the left eye 2 (two) times daily as needed (dryness).   psyllium (METAMUCIL) 58.6 % powder Take 1 packet by mouth daily.   rosuvastatin (CRESTOR) 20 MG tablet Take 1 tablet by mouth once daily   tadalafil (CIALIS) 20 MG tablet Take 1 tablet (20 mg total) by mouth daily as needed for erectile dysfunction.   tamsulosin (FLOMAX) 0.4 MG CAPS capsule Take 1 capsule (0.4 mg total) by mouth daily.   valsartan (DIOVAN) 320 MG tablet Take 1 tablet (320 mg total) by mouth daily.     Review of Systems      All other systems reviewed and are otherwise negative except as noted above.  Physical Exam    VS:  BP 108/66   Pulse 74   Ht '5\' 4"'$  (1.626 m)   Wt 173 lb (78.5 kg)   SpO2 96%   BMI 29.70  kg/m  , BMI Body mass index is 29.7 kg/m.  Wt Readings from Last 3 Encounters:  02/09/23 173 lb (78.5 kg)  02/08/23 174 lb 12.8 oz (79.3 kg)  01/10/23 176 lb 12.8 oz (80.2 kg)     GEN: Well nourished, well developed, in no acute distress. HEENT: normal. Neck: Supple, no JVD, carotid bruits, or masses. Cardiac: RRR, no murmurs, rubs, or gallops. No clubbing, cyanosis, edema.  Radials/PT 2+ and equal bilaterally.  Respiratory:  Respirations regular and unlabored, clear to auscultation bilaterally. GI: Soft, nontender, nondistended. MS: No deformity or atrophy. Skin: Warm and dry, no rash. Neuro:  Strength and sensation are intact. Psych: Normal affect.  Assessment & Plan    No chest pain, CAD -Continue medications which grams, HCTZ 25 mg daily, metoprolol succinate 25 mg daily, nitro as needed, Crestor 20 mg daily, Diovan 320 mg daily -No chest pain, he does have shortness of breath but he has a URI currently  HTN -Blood pressure well-controlled -Continue current medication  regimen -Continue to monitor blood pressure at home  HFmEF -Will plan to repeat echocardiogram if shortness of breath does not improve after URI -Euvolemic on exam today, no lower extremity edema history -Continue current medications  HLD -Lab work was done November 2023 which showed LDL 44, HDL 43, triglycerides 170 -He is taking Crestor 20 mg once a day. -Would consider adding fenofibrate EITHER for better triglyceride control at next apt         Disposition: Follow up 6 months with Larae Grooms, MD or APP.  Signed, Elgie Collard, PA-C 02/09/2023, 1:28 PM [facility]

## 2023-02-09 NOTE — Patient Instructions (Signed)
Medication Instructions:  Your physician recommends that you continue on your current medications as directed. Please refer to the Current Medication list given to you today.  *If you need a refill on your cardiac medications before your next appointment, please call your pharmacy*   Lab Work: None If you have labs (blood work) drawn today and your tests are completely normal, you will receive your results only by: [place] (if you have MyChart) OR A paper copy in the mail If you have any lab test that is abnormal or we need to change your treatment, we will call you to review the results.   Follow-Up: At [facility], you and your health needs are our priority.  As part of our continuing mission to provide you with exceptional heart care, we have created designated Provider Care Teams.  These Care Teams include your primary Cardiologist (physician) and Advanced Practice Providers (APPs -  Physician Assistants and Nurse Practitioners) who all work together to provide you with the care you need, when you need it.  Your next appointment:   4-6 month(s)  Provider:   Larae Grooms, MD    Other Instructions Call us if you are still short of breath after viral illness.

## 2023-02-13 DIAGNOSIS — G4733 Obstructive sleep apnea (adult) (pediatric): Secondary | ICD-10-CM | POA: Diagnosis not present

## 2023-03-10 ENCOUNTER — Ambulatory Visit: Payer: Medicare Other | Admitting: Adult Health

## 2023-03-11 ENCOUNTER — Ambulatory Visit: Payer: Medicare Other | Admitting: Physician Assistant

## 2023-03-16 DIAGNOSIS — G4733 Obstructive sleep apnea (adult) (pediatric): Secondary | ICD-10-CM | POA: Diagnosis not present

## 2023-03-17 ENCOUNTER — Ambulatory Visit: Payer: Medicare Other

## 2023-03-17 DIAGNOSIS — I5020 Unspecified systolic (congestive) heart failure: Secondary | ICD-10-CM

## 2023-03-17 NOTE — Progress Notes (Addendum)
Care Management & Coordination Services Pharmacy Note  03/17/2023 Name:  David Goodman MRN:  [id number] DOB:  1941-03-04  Summary: Patient presents for follow-up consult.   Blood pressures controlled, patient continues to experience dizziness with postural changes which remains at his baseline.  Recommendations/Changes made from today's visit: Continue current medications   Follow up plan: CPP follow-up 6 months   Subjective: David Goodman is an 82 y.o. year old male who is a primary patient of Rudd, Bertram Millard, MD.  The care coordination team was consulted for assistance with disease management and care coordination needs.    Engaged with patient by telephone for follow up visit.  Recent office visits: 02/08/23: Patient presented to Dr. Veto Kemps for Viral URI.  10/26/22: Patient presented to Dr. Veto Kemps for follow-up.   Recent consult visits: 02/09/23: Patient presented to Jari Favre, PA-C (Cardiology) for follow-up.  12/16/22: Patient presented to Dr. Clovis Cao (ENT) 10/27/22: Patient presented to Dr. Shon Baton (Sports Medicine) for back pain.  09/13/22: Patient presented to Manson Passey, PA  09/08/22: Patient presented to Ihor Austin, NP (Neurology) for OSA.   Hospital visits: 02/04/23: Patient presented to ED for laceration.    Objective:  Lab Results  Component Value Date   CREATININE 1.05 09/05/2022   BUN 24 (H) 09/05/2022   GFR 78.54 04/22/2022   EGFR 79 08/10/2022   GFRNONAA >60 09/05/2022   GFRAA >60 07/18/2020   NA 140 09/05/2022   K 4.5 09/05/2022   CALCIUM 8.9 09/05/2022   CO2 31 09/05/2022   GLUCOSE 96 09/05/2022    Lab Results  Component Value Date/Time   HGBA1C 5.6 02/27/2021 09:50 AM   HGBA1C 5.2 07/17/2020 02:12 AM   GFR 78.54 04/22/2022 10:30 AM   GFR 81.92 07/10/2021 10:16 AM    Last diabetic Eye exam:  Lab Results  Component Value Date/Time   HMDIABEYEEXA No Retinopathy 09/14/2022 12:00 AM    Last diabetic Foot exam: No results found for:  "HMDIABFOOTEX"   Lab Results  Component Value Date   CHOL 121 10/26/2022   HDL 43.30 10/26/2022   LDLCALC 44 10/26/2022   LDLDIRECT 129.0 02/27/2021   TRIG 170.0 (H) 10/26/2022   CHOLHDL 3 10/26/2022       Latest Ref Rng & Units 04/16/2022    2:55 AM 04/12/2022    8:51 AM 04/12/2022   12:46 AM  Hepatic Function  Total Protein 6.5 - 8.1 g/dL 4.5  4.3  4.5   Albumin 3.5 - 5.0 g/dL 2.7  2.7  2.7   AST 15 - 41 U/L 16  14  16    ALT 0 - 44 U/L 13  14  14    Alk Phosphatase 38 - 126 U/L 27  29  30    Total Bilirubin 0.3 - 1.2 mg/dL 1.0  1.2  1.3     Lab Results  Component Value Date/Time   TSH 2.00 10/26/2022 10:18 AM   TSH 4.93 05/12/2022 09:52 AM   FREET4 1.04 05/12/2022 09:52 AM       Latest Ref Rng & Units 10/26/2022   10:18 AM 09/05/2022    3:12 PM 05/21/2022    3:35 PM  CBC  WBC 4.0 - 10.5 K/uL 10.6  6.4  9.7   Hemoglobin 13.0 - 17.0 g/dL 40.9  81.1  91.4   Hematocrit 39.0 - 52.0 % 40.0  38.2  36.3   Platelets 150.0 - 400.0 K/uL 313.0  250  289     Lab Results  Component Value Date/Time  VD25OH 43.12 10/26/2022 10:18 AM   VD25OH 36.47 05/02/2019 08:18 AM   VITAMINB12 >1500 (H) 10/26/2022 10:18 AM   VITAMINB12 856 07/30/2020 02:06 PM    Clinical ASCVD: No  The ASCVD Risk score (Arnett DK, et al., 2019) failed to calculate for the following reasons:   The 2019 ASCVD risk score is only valid for ages 21 to 76        02/08/2023    9:28 AM 01/10/2023   11:06 AM 12/10/2022   10:45 AM  Depression screen PHQ 2/9  Decreased Interest 0 0 0  Down, Depressed, Hopeless 0 0 0  PHQ - 2 Score 0 0 0  Altered sleeping 0    Tired, decreased energy 3    Change in appetite 0    Feeling bad or failure about yourself  0    Trouble concentrating 1    Moving slowly or fidgety/restless 0    Suicidal thoughts 0    PHQ-9 Score 4    Difficult doing work/chores Not difficult at all       Social History   Tobacco Use  Smoking Status Never  Smokeless Tobacco Never   BP  Readings from Last 3 Encounters:  02/09/23 108/66  02/08/23 122/70  02/04/23 135/72   Pulse Readings from Last 3 Encounters:  02/09/23 74  02/08/23 60  02/04/23 74   Wt Readings from Last 3 Encounters:  02/09/23 173 lb (78.5 kg)  02/08/23 174 lb 12.8 oz (79.3 kg)  01/10/23 176 lb 12.8 oz (80.2 kg)   BMI Readings from Last 3 Encounters:  02/09/23 29.70 kg/m  02/08/23 30.00 kg/m  01/10/23 30.35 kg/m    No Known Allergies  Medications Reviewed Today     Reviewed by Durenda Hurt, CMA (Certified Medical Assistant) on 02/09/23 at 1305  Med List Status: <None>   Medication Order Taking? Sig Documenting Provider Last Dose Status Informant  Ascorbic Acid (VITAMIN C) 1000 MG tablet [id number] Yes Take 1,000 mg by mouth 2 (two) times a week. [provider] Taking Active Self, Pharmacy Records           Med Note Epimenio Sarin, Seth Bake Apr 11, 2022  3:04 PM)    aspirin EC 81 MG tablet [id number] Yes Take 1 tablet (81 mg total) by mouth daily. Swallow whole. Laurann Montana, PA-C Taking Active   hydrochlorothiazide (HYDRODIURIL) 25 MG tablet [id number] Yes Take 1 tablet (25 mg total) by mouth daily. Loyola Mast, MD Taking Active   latanoprost (XALATAN) 0.005 % ophthalmic solution [id number] Yes Place 1 drop into both eyes at bedtime. Gordy Savers, MD Taking Active Self, Pharmacy Records  MAGNESIUM PO [id number] Yes Take 400 mg by mouth at bedtime. [provider] Taking Active Self, Pharmacy Records  metoprolol succinate (TOPROL XL) 25 MG 24 hr tablet [id number] Yes Take 1 tablet (25 mg total) by mouth daily. Beatrice Lecher, PA-C Taking Active Self, Pharmacy Records  Multiple Vitamin (MULTIVITAMIN WITH MINERALS) TABS tablet [id number] Yes Take 1 tablet by mouth daily. [provider] Taking Active Self, Pharmacy Records           Med Note (CRUTHIS, Seth Bake Apr 11, 2022  2:17 PM)    naproxen (NAPROSYN) 500 MG tablet [id number] Yes Take 500 mg by mouth  2 (two) times daily. [provider] Taking Active   nitroGLYCERIN (NITROSTAT) 0.4 MG SL tablet [id number] Yes Place 1 tablet (0.4 mg total) under the tongue every  5 (five) minutes as needed for chest pain. Corky Crafts, MD Taking Active Self, Pharmacy Records  Olopatadine-Mometasone [place]) 098-11 MCG/ACT Santina Evans [id number] Yes Place 1-2 sprays into the nose in the morning and at bedtime. Ellamae Sia, DO Taking Active   pantoprazole (PROTONIX) 40 MG tablet [id number] Yes Take 1 tablet (40 mg total) by mouth daily. Corky Crafts, MD Taking Active   Polyvinyl Alcohol-Povidone [facility]) [id number] Yes Place 1 drop into the left eye 2 (two) times daily as needed (dryness). [provider] Taking Active Self, Pharmacy Records  psyllium (METAMUCIL) 58.6 % powder [id number] Yes Take 1 packet by mouth daily. [provider] Taking Active Self, Pharmacy Records  rosuvastatin (CRESTOR) 20 MG tablet [id number] Yes Take 1 tablet by mouth once daily Loyola Mast, MD Taking Active   tadalafil (CIALIS) 20 MG tablet [id number] Yes Take 1 tablet (20 mg total) by mouth daily as needed for erectile dysfunction. Philip Aspen, Limmie Patricia, MD Taking Active Self, Pharmacy Records  tamsulosin [facility]) 0.4 MG CAPS capsule [id number] Yes Take 1 capsule (0.4 mg total) by mouth daily. Loyola Mast, MD Taking Active   valsartan (DIOVAN) 320 MG tablet [id number] Yes Take 1 tablet (320 mg total) by mouth daily. Corky Crafts, MD Taking Active             SDOH:  (Social Determinants of Health) assessments and interventions performed: Yes SDOH Interventions    Flowsheet Row Clinical Support from 01/10/2023 in [facility] at [facility] Telephone from 12/10/2022 in [facility] Care Coordination Chronic Care Management from 12/17/2021 in [facility] at [facility] Visit from 05/29/2021 in [facility]  [place] [facility] Chronic Care Management from 01/05/2021 in [facility] at [place]  SDOH Interventions       Food Insecurity Interventions Intervention Not Indicated -- -- -- --  Housing Interventions -- Intervention Not Indicated -- -- --  Transportation Interventions Intervention Not Indicated -- -- -- Intervention Not Indicated  Utilities Interventions -- Intervention Not Indicated -- -- --  Depression Interventions/Treatment  -- -- -- Medication --  Financial Strain Interventions Intervention Not Indicated -- Intervention Not Indicated -- Intervention Not Indicated  Physical Activity Interventions Intervention Not Indicated -- -- -- --  Stress Interventions Intervention Not Indicated -- -- -- --      SDOH Screenings   Food Insecurity: No Food Insecurity (01/10/2023)  Housing: Low Risk  (12/10/2022)  Transportation Needs: No Transportation Needs (01/10/2023)  Utilities: Not At Risk (01/08/2023)  Alcohol Screen: Low Risk  (11/15/2018)  Depression (PHQ2-9): Low Risk  (02/08/2023)  Financial Resource Strain: Low Risk  (01/10/2023)  Physical Activity: Insufficiently Active (01/10/2023)  Stress: No Stress Concern Present (01/10/2023)  Tobacco Use: Low Risk  (02/09/2023)    Medication Assistance: None required.  Patient affirms current coverage meets needs.  Medication Access: Within the past 30 days, how often has patient missed a dose of medication? None Is a pillbox or other method used to improve adherence? Yes  Factors that may affect medication adherence? no barriers identified Are meds synced by current pharmacy? No  Are meds delivered by current pharmacy? Yes  Does patient experience delays in picking up medications due to transportation concerns? No   [facility] Reviewed: Is patient disadvantaged to use [facility]?: Yes  Current Rx insurance plan: [employer] Name and location of Current pharmacy:  [facility]  Delivery) - [place],  [place] - [address] [facility] [address] [place] [address] [place] [address] Phone: [phone] Fax: [phone]  [facility] - [place], [place] - [address] [place] [address] Phone: [phone] Fax: [phone]  [facility] [employer] [facility] [address] [place], [place] - [address] [place] [address] Phone: [phone] Fax: [phone]  [facility] services reviewed with patient today?: No  Patient requests to transfer care to [facility]?: No  Reason patient declined to change pharmacies: Disadvantaged due to insurance/mail order  Compliance/Adherence/Medication fill history: Care Gaps: Shingrix Awv Covid  Star-Rating Drugs: Valsartan 320 mg  mg last filled 10/22/22 for 90-DS at [facility].  Rosuvastatin 20 mg last filled 07/05/2022 90 day supply at [facility].   Assessment/Plan  Heart Failure (Goal: manage symptoms and prevent exacerbations) -Controlled -Last ejection fraction: 40% (Date: 12/09/21) -HF type: HFmrEF (mildly reduced EF 41-49%) -NYHA Class: II (slight limitation of activity) -Current treatment: HCTZ 25 mg daily  Metoprolol XL 25 mg daily Valsartan 320 mg daily  -Medications previously tried: Chief Financial Officer (Cost)   -Current home BP/HR readings: 136-138/60s, 129/62  - Reports hypotensive symptoms: Dizziness when bending over.  -Current exercise habits: Gym 3x weekly (Cardio + stretches + strength exercises. Patient is limited by pain in left knee. He feels his shortness of breath has been improving.  -Recommended to continue current medication  Hyperlipidemia: (LDL goal < 70) -Controlled -Current treatment: Rosuvastatin 20 mg daily  -Current treatment: Aspirin 81 mg daily  -Medications previously tried: NA  -Recommended to continue current medication  GERD (Goal: Prevent heartburn) -Controlled -Current treatment   Pantoprazole 40 mg daily: Appropriate, Effective, Safe, Accessible  -Medications previously tried: Prilosec -Recommended to continue current medication  BPH (Goal: Minimize urinary symptoms) -Controlled -Current treatment  Tamsulosin 0.4 mg daily: Appropriate, Effective, Safe, Accessible  -Medications previously tried: NA  -Recommended to continue current medication  Allergic Rhinitis (Goal: Prevent allergy symptoms) -Controlled -Current treatment  Cetirizine 10 mg daily: Appropriate, Effective, Safe, Accessible  Flonase 61mcg/act 1 spray twice daily: Appropriate, Effective, Safe, Accessible  -Medications previously tried: NA  -Recommended to continue current medication  Follow Up Plan: Telephone follow up appointment with care management team member scheduled for: 09/15/2023 at 1:00 PM  Angelena Sole, PharmD, BCACP, CPP Clinical Pharmacist Practitioner  [facility]  [phone]

## 2023-03-30 ENCOUNTER — Ambulatory Visit: Payer: Medicare Other | Admitting: Neurology

## 2023-03-30 ENCOUNTER — Encounter: Payer: Self-pay | Admitting: Neurology

## 2023-03-30 VITALS — BP 147/65 | HR 73 | Ht 65.0 in | Wt 174.0 lb

## 2023-03-30 DIAGNOSIS — I2510 Atherosclerotic heart disease of native coronary artery without angina pectoris: Secondary | ICD-10-CM | POA: Diagnosis not present

## 2023-03-30 DIAGNOSIS — J310 Chronic rhinitis: Secondary | ICD-10-CM | POA: Insufficient documentation

## 2023-03-30 DIAGNOSIS — R0683 Snoring: Secondary | ICD-10-CM | POA: Diagnosis not present

## 2023-03-30 DIAGNOSIS — G4733 Obstructive sleep apnea (adult) (pediatric): Secondary | ICD-10-CM | POA: Diagnosis not present

## 2023-03-30 DIAGNOSIS — R0602 Shortness of breath: Secondary | ICD-10-CM | POA: Diagnosis not present

## 2023-03-30 NOTE — Progress Notes (Signed)
Provider:  Melvyn Novas, MD  Primary Care Physician:  Loyola Mast, MD [address] [place] [address]     Referring Provider: Loyola Mast, Md [address] [place],  [place] [address]          Chief Complaint according to patient   Patient presents with:     New Patient (Initial Visit)           HISTORY OF PRESENT ILLNESS:  David Goodman is a 82 y.o. male patient who is here for revisit 03/30/2023 for  CPAP compliance. .  Chief concern according to patient :  I have trouble using CPAP, my nose is blocked, I wake with headaches, and when I don't use the CPAP I don't have the headaches."  The patient has only been able to use his CPAP 60% of the days of the last months.  On average when he uses the machine it is for 5-1/2 hours.  It is an AutoSet between 5 and 10 cm water without EPR and he uses a nasal mask visit.  His residual AHI is 3.4 which is still a very good resolution the 95th percentile pressure is 9.8 cm water so close to the current maximum setting he does have minimal air leakage the 95th percentile air leak is 4.6 L a minute and this is truly low flow.  So numerically this machine is doing him well but he does have problems with nasal congestion and resulting headaches.  He also has tried nasal spray and allergy medicines which have not changed the outcome.   He is active and exercises 3 times a week. He is scheduled to have a TKR ( TEP total endoprosthesis of the knee) with Dr Lequita Halt soon.   He reports an Epworth score today of only 3 points out of 24 his FSS at 10 / 63 points, and the geriatric depression score is endorsed at 2 out of 15 which is not an elevated level,     Last visit was with Deborah Chalk Cue ,NP 09-08-2022  Update 09/08/2022 JM: Patient returns for initial CPAP compliance visit.  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  CPAP initiated 7/17.   Reports tolerating CPAP well.  Reports improvement of sleep quality and daytime energy levels.  Routinely follows with [facility].  No CPAP related concerns today.  He does report having higher blood pressure more recently, is being seen by PCP tomorrow and cardiology next week.   Epworth Sleepiness Scale 4/24 (prior to CPAP 10/24) Fatigue severity scale 14/63 (prior to CPAP 19/63)  Last sleep test 01-24-2022: Snoring was present until CPAP exceeded 8cm water.    PERIODIC LIMB MOVEMENTS:  The patient had a total of 41 Periodic Limb Movements. The Periodic Limb Movement (PLM) Arousal index was 1.3 /hour. There were very rare PLMs in REM sleep.  Audio and video analysis did not show any abnormal or unusual movements, behaviors, phonations or vocalizations.   EKG was in keeping with normal sinus rhythm (NSR). Post-study, the patient indicated that sleep was very good and restful.    DIAGNOSIS 1.        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 2.  An [employer] EVORA mask in small to medium size worked well for David Goodman.  3.        No Sleep Related Hypoxemia was seen.  4.        Periodic Limb Movement Disorder was present, mild but significant enough due to some PLMs being seen in REM sleep.  5.        Non-specific abnormal EKG, frequent PACs/PVCs.     PLANS/RECOMMENDATIONS: auto CPAP with 2 cm EPR, set between 5 and 10 cm water pressure, heated humidification and an EVORA mask s/m size. ResMed machine preferred.  1.        Any apnea patient should avoid sedatives, hypnotics, and alcohol consumption at bed 2.        CPAP therapy compliance is defined as 4 hours or more of nightly use.      DISCUSSION: please start using your new machine 4 hours or more each night- if you can't for any  reason let us and DME know about the problems you are having, do not wait until your next visit.  Review of Systems: Out of a complete 14 system review, the patient complains of only the following symptoms, and all other reviewed systems are negative.:  How likely are you to doze in the following situations: 0 = not likely, 1 = slight chance, 2 = moderate chance, 3 = high chance   Sitting and Reading? Watching Television? Sitting inactive in a public place (theater or meeting)? As a passenger in a car for an hour without a break? Lying down in the afternoon when circumstances permit? Sitting and talking to someone? Sitting quietly after lunch without alcohol? In a car, while stopped for a few minutes in traffic?   He reports an Epworth score today of only 3 points out of 24 his FSS at 10 / 63 points, and the geriatric depression score is endorsed at 2 out of 15 which is not an elevated level, Social History   Socioeconomic History   Marital status: Married    Spouse name: Not on file   Number of children: Not on file   Years of education: Not on file   Highest education level: Not on file  Occupational History   Not on file  Tobacco Use   Smoking status: Never   Smokeless tobacco: Never  Vaping Use   Vaping Use: Never used  Substance and Sexual Activity   Alcohol use: Yes    Comment: couple times a week - wine   Drug use: No   Sexual activity: Not Currently  Other Topics Concern   Not on file  Social History Narrative   Lives alone   Right handed   Caffeine: 1 cup of coffee a day   Social Determinants of Health   Financial Resource Strain: Low Risk  (01/10/2023)   Overall Financial Resource Strain (CARDIA)    Difficulty of Paying Living Expenses: Not hard at all  Food Insecurity: No Food Insecurity (01/10/2023)   Hunger Vital Sign    Worried About Running Out of Food in the Last Year: Never true    Ran Out of Food in the Last Year: Never true  Transportation Needs:  No Transportation Needs (01/10/2023)   PRAPARE - Administrator, Civil Service (Medical): No    Lack of Transportation (Non-Medical): No  Physical Activity: Insufficiently Active (01/10/2023)   Exercise Vital Sign    Days of Exercise per Week: 3 days  Minutes of Exercise per Session: 40 min  Stress: No Stress Concern Present (01/10/2023)   [employer] of Occupational Health - Occupational Stress Questionnaire    Feeling of Stress : Not at all  Social Connections: Not on file    Family History  Problem Relation Age of Onset   Cancer Brother     Past Medical History:  Diagnosis Date   ALLERGIC RHINITIS 07/13/2007   Aorta dilated on Echo; Normal sized Aorta on CT    Chest CT 1/23: No thoracic aortic aneurysm; coronary calcifications; aortic atherosclerosis; improved aeration of lungs with persistent minimal reticular opacities   CAD (coronary artery disease)    GERD (gastroesophageal reflux disease)    GI bleed    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)    HYPERLIPIDEMIA 07/13/2007   HYPERTENSION 07/13/2007   Impaired glucose tolerance 09/27/2016   Iron deficiency anemia due to chronic blood loss 02/27/2021   NEPHROLITHIASIS, 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   NSVT (nonsustained ventricular tachycardia) (HCC)    PEPTIC ULCER DISEASE 07/13/2007   PVC's (premature ventricular contractions)    SVT (supraventricular tachycardia)    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    Past Surgical History:  Procedure Laterality Date   BROW LIFT Bilateral 11/10/2020   Procedure: BLEPHAROPLASTY;  Surgeon: Allena Napoleon, MD;  Location: [facility];  Service: Plastics;  Laterality: Bilateral;  1 hour   COLONOSCOPY N/A 04/12/2022   Procedure: COLONOSCOPY;  Surgeon: Willis Modena, MD;  Location: [facility];  Service: Gastroenterology;  Laterality: N/A;   COLONOSCOPY WITH PROPOFOL N/A 02/17/2019   Procedure: COLONOSCOPY WITH PROPOFOL;  Surgeon: [name], MD;  Location: [facility];  Service: Gastroenterology;  Laterality: N/A;   CORONARY STENT INTERVENTION N/A 08/25/2021   Procedure: CORONARY STENT INTERVENTION;  Surgeon: Corky Crafts, MD;  Location: [facility] INVASIVE CV LAB;  Service: Cardiovascular;  Laterality: N/A;   CORONARY ULTRASOUND/IVUS N/A 08/25/2021   Procedure: Intravascular Ultrasound/IVUS;  Surgeon: Corky Crafts, MD;  Location: [facility] INVASIVE CV LAB;  Service: Cardiovascular;  Laterality: N/A;   HYDROCELE EXCISION Right 05/15/2003   IR ANGIOGRAM VISCERAL SELECTIVE  02/16/2019   IR ANGIOGRAM VISCERAL SELECTIVE  02/16/2019   IR ANGIOGRAM VISCERAL SELECTIVE  02/16/2019   IR US GUIDE VASC ACCESS RIGHT  02/16/2019   LEFT HEART CATH AND CORONARY ANGIOGRAPHY N/A 07/28/2021   Procedure: LEFT HEART CATH AND CORONARY ANGIOGRAPHY;  Surgeon: Corky Crafts, MD;  Location: [facility] INVASIVE CV LAB;  Service: Cardiovascular;  Laterality: N/A;   LEFT HEART CATH AND CORONARY ANGIOGRAPHY N/A 08/25/2021   Procedure: LEFT HEART CATH AND CORONARY ANGIOGRAPHY;  Surgeon: Corky Crafts, MD;  Location: [facility] INVASIVE CV LAB;  Service: Cardiovascular;  Laterality: N/A;   PERIPHERAL INTRAVASCULAR LITHOTRIPSY  08/25/2021   Procedure: INTRAVASCULAR LITHOTRIPSY;  Surgeon: Corky Crafts, MD;  Location: [facility] INVASIVE CV LAB;  Service: Cardiovascular;;   ROTATOR CUFF REPAIR     SPERMATOCELECTOMY Right 05/15/2003     Current Outpatient Medications on File Prior to Visit  Medication Sig Dispense Refill   Ascorbic Acid (VITAMIN C) 1000 MG tablet Take 1,000 mg by mouth 2 (two) times a week.     aspirin EC 81 MG tablet Take 1 tablet (81 mg total) by mouth daily. Swallow whole. 90 tablet 3   hydrochlorothiazide (HYDRODIURIL) 25 MG tablet Take 1 tablet (25 mg total) by mouth daily. 90 tablet 3   latanoprost (XALATAN) 0.005 % ophthalmic solution Place 1 drop into both eyes at bedtime.  7.5 mL 1   MAGNESIUM PO Take 400 mg by mouth at bedtime.      Multiple Vitamin (MULTIVITAMIN WITH MINERALS) TABS tablet Take 1 tablet by mouth daily.     naproxen (NAPROSYN) 500 MG tablet Take 500 mg by mouth 2 (two) times daily.     nitroGLYCERIN (NITROSTAT) 0.4 MG SL tablet Place 1 tablet (0.4 mg total) under the tongue every 5 (five) minutes as needed for chest pain. 25 tablet 3   Olopatadine-Mometasone (RYALTRIS) 665-25 MCG/ACT SUSP Place 1-2 sprays into the nose in the morning and at bedtime. 29 g 5   pantoprazole (PROTONIX) 40 MG tablet Take 1 tablet (40 mg total) by mouth daily. 30 tablet 11   Polyvinyl Alcohol-Povidone (REFRESH OP) Place 1 drop into the left eye 2 (two) times daily as needed (dryness).     psyllium (METAMUCIL) 58.6 % powder Take 1 packet by mouth daily.     rosuvastatin (CRESTOR) 20 MG tablet Take 1 tablet by mouth once daily 90 tablet 3   tadalafil (CIALIS) 20 MG tablet Take 1 tablet (20 mg total) by mouth daily as needed for erectile dysfunction. 30 tablet 2   tamsulosin (FLOMAX) 0.4 MG CAPS capsule Take 1 capsule (0.4 mg total) by mouth daily. 90 capsule 3   valsartan (DIOVAN) 320 MG tablet Take 1 tablet (320 mg total) by mouth daily. 90 tablet 3   metoprolol succinate (TOPROL XL) 25 MG 24 hr tablet Take 1 tablet (25 mg total) by mouth daily. 90 tablet 3   No current facility-administered medications on file prior to visit.    No Known Allergies   DIAGNOSTIC DATA (LABS, IMAGING, TESTING) - I reviewed patient records, labs, notes, testing and imaging myself where available.  Lab Results  Component Value Date   WBC 10.6 (H) 10/26/2022   HGB 13.1 10/26/2022   HCT 40.0 10/26/2022   MCV 81.8 10/26/2022   PLT 313.0 10/26/2022      Component Value Date/Time   NA 140 09/05/2022 1512   NA 138 08/10/2022 1151   K 4.5 09/05/2022 1512   CL 102 09/05/2022 1512   CO2 31 09/05/2022 1512   GLUCOSE 96 09/05/2022 1512   BUN 24 (H) 09/05/2022 1512   BUN 21 08/10/2022 1151   CREATININE 1.05 09/05/2022 1512   CALCIUM 8.9  09/05/2022 1512   PROT 4.5 (L) 04/16/2022 0255   PROT 6.4 12/09/2021 0838   ALBUMIN 2.7 (L) 04/16/2022 0255   ALBUMIN 4.4 12/09/2021 0838   AST 16 04/16/2022 0255   ALT 13 04/16/2022 0255   ALKPHOS 27 (L) 04/16/2022 0255   BILITOT 1.0 04/16/2022 0255   BILITOT 0.4 12/09/2021 0838   GFRNONAA >60 09/05/2022 1512   GFRAA >60 07/18/2020 0418   Lab Results  Component Value Date   CHOL 121 10/26/2022   HDL 43.30 10/26/2022   LDLCALC 44 10/26/2022   LDLDIRECT 129.0 02/27/2021   TRIG 170.0 (H) 10/26/2022   CHOLHDL 3 10/26/2022   Lab Results  Component Value Date   HGBA1C 5.6 02/27/2021   Lab Results  Component Value Date   VITAMINB12 >1500 (H) 10/26/2022   Lab Results  Component Value Date   TSH 2.00 10/26/2022    PHYSICAL EXAM:  Today's Vitals   03/30/23 1022  BP: (!) 147/65  Pulse: 73  Weight: 174 lb (78.9 kg)  Height: 5\' 5"  (1.651 m)   Body mass index is 28.96 kg/m.   Wt Readings from Last 3 Encounters:  03/30/23 174  lb (78.9 kg)  02/09/23 173 lb (78.5 kg)  02/08/23 174 lb 12.8 oz (79.3 kg)     Ht Readings from Last 3 Encounters:  03/30/23 5\' 5"  (1.651 m)  02/09/23 5\' 4"  (1.626 m)  02/08/23 5\' 4"  (1.626 m)      General: The patient is awake, alert and appears not in acute distress . The patient is well groomed. Head: Normocephalic, atraumatic. Neck is supple. Mildly Deviated septum - ENT chronic congestion- allergic ? Allergy clinic couldn't find allergies- Mallampati  1-2 , red!,  neck circumference:16.5  inches . Nasal airflow not  patent.  Retrognathia is  seen.  Dental status: dentures- taking the bridge out at night.   Cardiovascular:  Regular rate and cardiac rhythm by pulse,  without distended neck veins. Respiratory: Lungs are clear to auscultation.  Skin:  Without evidence of ankle edema, or rash. Trunk: The patient's posture is erect.   NEUROLOGIC EXAM: The patient is awake and alert, oriented to place and time.   Memory subjective  described as intact.  Attention span & concentration ability appears normal.  Speech is fluent,  without  dysarthria, dysphonia or aphasia.  Mood and affect are appropriate.   Cranial nerves: no loss of smell or taste reported  Pupils are equal and briskly reactive to light. Funduscopic exam deferred. .  Extraocular movements in vertical and horizontal planes were intact and without nystagmus. No Diplopia. Visual fields by finger perimetry are intact. Hearing was intact to soft voice and finger rubbing.    Facial sensation intact to fine touch.  Facial motor strength is symmetric and tongue and uvula move midline.  Neck ROM : rotation, tilt and flexion extension were normal for age and shoulder shrug was symmetrical.    Motor exam:  Symmetric bulk, tone and ROM.   Normal tone without cog wheeling, symmetric grip strength .  Deep tendon reflexes: in the  upper and lower extremities are symmetric and intact.  Babinski response was deferred   ASSESSMENT AND PLAN 82 y.o. year old male  here with: OSA - CPAP treatment restored alertness and reduced fatigue for this male patient with CAD. Dr Eldridge Dace.    when tested, his OSA was severe in REM sleep at AHI 43 and half the AHI in NREM sleep at 21.2/h . TOTAL AHI was 25.7/h and the patient was titrated to 9 cm water with 2 cm EPR. This apnea form is not as likely to respond to [facility].   1) he uses humidification at night, still waking up when a dry mouth, congested nose- he changed to a nasal mask- and still is a mouth breather,  he is willing to use a chin strap.   He may have CPAP rhinitis.  I reviewed CT sinus and allergy testing - all negative. It has been shown that CPAP treatment can lead to an increase in nasal inflammation, and this may worsen nasal symptoms over time. Saline nasal rinse and Mometasone NS have not helped.  Add biotin mouth wash and chin strap.    I like for this patient to continue using CPAP and will order a CHIN  strap to prevent his mouth from dropping open,  dentures  are helpful for stability of facial structures when wearing FFM.    Will follow up  through our NP within 5 months.   I would like to thank Loyola Mast, Md [address] [place],  [place] [address] for allowing me to meet with and to take  care of this pleasant patient.   CC: I will share my notes with Dr Eldridge Dace.  After spending a total time of  40  minutes face to face and additional time for physical and neurologic examination, review of laboratory studies,  personal review of imaging studies, reports and results of other testing and review of referral information / records as far as provided in visit,   Electronically signed by: Melvyn Novas, MD 03/30/2023 10:33 AM  [facility] Board certified by The [employer] of Sleep Medicine and Diplomate of the [employer] of Sleep Medicine. Board certified In Neurology through the ABPN, Fellow of the [employer] of Neurology. Medical Director of [facility].

## 2023-03-30 NOTE — Patient Instructions (Addendum)
OSA was severe in REM sleep at AHI 43 and half the AHI in NREM sleep at 21.2/h .  TOTAL AHI was 25.7/h and the patient was titrated to 9 cm water with 2 cm EPR. This apnea form is not as likely to respond to [facility].   1) he uses humidification at night, still waking up when a dry mouth, congested nose- he changed to a nasal mask- and still is a mouth breather,  he is willing to use a chin strap.  He is not reporting condensation in tubing or mask.   He may have CPAP rhinitis.  I reviewed CT sinus and allergy testing - all negative. It has been shown that CPAP treatment can lead to an increase in nasal inflammation, and this may worsen nasal symptoms over time. Saline nasal rinse and Mometasone NS have not helped.  Add biotin mouth wash and chin strap.    I like for this patient to continue using CPAP and will order a CHIN strap to prevent his mouth from dropping open,  wearing dentures is helpful for stability of facial structures when wearing FFM. If all this fails, I will relent to him not using CPAP.   He will be using the machine for a week before having surgery- !   Will follow up through our NP within 5 months.

## 2023-04-01 DIAGNOSIS — M170 Bilateral primary osteoarthritis of knee: Secondary | ICD-10-CM | POA: Diagnosis not present

## 2023-04-06 ENCOUNTER — Telehealth: Payer: Self-pay | Admitting: *Deleted

## 2023-04-06 NOTE — Telephone Encounter (Signed)
   Name: David Goodman  DOB: 05-14-1941  MRN: [id number]  Primary Cardiologist: Lance Muss, MD   Preoperative team, please contact this patient and set up a phone call appointment for further preoperative risk assessment. Please obtain consent and complete medication review. Thank you for your help.  I confirm that guidance regarding antiplatelet and oral anticoagulation therapy has been completed and, if necessary, noted below.  Ideally aspirin should be continued without interruption, however if the bleeding risk is too great, aspirin may be held for 5-7 days prior to surgery. Please resume aspirin post operatively when it is felt to be safe from a bleeding standpoint.     Carlos Levering, NP 04/06/2023, 12:13 PM [facility]

## 2023-04-06 NOTE — Telephone Encounter (Signed)
S/w pt scheduled telephone clearance.    Patient Consent for Virtual Visit         David Goodman has provided verbal consent on 04/06/2023 for a virtual visit (video or telephone).   CONSENT FOR VIRTUAL VISIT FOR:  David Goodman  By participating in this virtual visit I agree to the following:  I hereby voluntarily request, consent and authorize [facility] and its employed or contracted physicians, physician assistants, nurse practitioners or other licensed health care professionals (the Practitioner), to provide me with telemedicine health care services (the "Services") as deemed necessary by the treating Practitioner. I acknowledge and consent to receive the Services by the Practitioner via telemedicine. I understand that the telemedicine visit will involve communicating with the Practitioner through live audiovisual communication technology and the disclosure of certain medical information by electronic transmission. I acknowledge that I have been given the opportunity to request an in-person assessment or other available alternative prior to the telemedicine visit and am voluntarily participating in the telemedicine visit.  I understand that I have the right to withhold or withdraw my consent to the use of telemedicine in the course of my care at any time, without affecting my right to future care or treatment, and that the Practitioner or I may terminate the telemedicine visit at any time. I understand that I have the right to inspect all information obtained and/or recorded in the course of the telemedicine visit and may receive copies of available information for a reasonable fee.  I understand that some of the potential risks of receiving the Services via telemedicine include:  Delay or interruption in medical evaluation due to technological equipment failure or disruption; Information transmitted may not be sufficient (e.g. poor resolution of images) to allow for appropriate medical  decision making by the Practitioner; and/or  In rare instances, security protocols could fail, causing a breach of personal health information.  Furthermore, I acknowledge that it is my responsibility to provide information about my medical history, conditions and care that is complete and accurate to the best of my ability. I acknowledge that Practitioner's advice, recommendations, and/or decision may be based on factors not within their control, such as incomplete or inaccurate data provided by me or distortions of diagnostic images or specimens that may result from electronic transmissions. I understand that the practice of medicine is not an exact science and that Practitioner makes no warranties or guarantees regarding treatment outcomes. I acknowledge that a copy of this consent can be made available to me via my patient portal [facility] MyChart), or I can request a printed copy by calling the office of [facility].    I understand that my insurance will be billed for this visit.   I have read or had this consent read to me. I understand the contents of this consent, which adequately explains the benefits and risks of the Services being provided via telemedicine.  I have been provided ample opportunity to ask questions regarding this consent and the Services and have had my questions answered to my satisfaction. I give my informed consent for the services to be provided through the use of telemedicine in my medical care

## 2023-04-06 NOTE — Telephone Encounter (Signed)
   Pre-operative Risk Assessment    Patient Name: David Goodman  DOB: [birth year] MRN: [id number]      Request for Surgical Clearance    Procedure:   Left Total Knee Arthroplasty   Date of Surgery:  Clearance 07/11/23                                 Surgeon:  Dr. Ollen Gross Surgeon's Group or Practice Name:  [facility] Phone number:  [phone] Fax number:  [phone]   Type of Clearance Requested:   - Medical  - Pharmacy:  Hold Aspirin Not Indicated   Type of Anesthesia:   Choice   Additional requests/questions:    Signed, Emmit Pomfret   04/06/2023, 7:34 AM

## 2023-04-19 ENCOUNTER — Other Ambulatory Visit: Payer: Self-pay | Admitting: Family Medicine

## 2023-04-19 DIAGNOSIS — J301 Allergic rhinitis due to pollen: Secondary | ICD-10-CM

## 2023-05-06 ENCOUNTER — Telehealth: Payer: Self-pay

## 2023-05-06 NOTE — Progress Notes (Unsigned)
Care Management & Coordination Services Pharmacy Team  Reason for Encounter: Hypertension  Contacted patient to discuss hypertension disease state. {US HC Outreach:28874}    Current antihypertensive regimen:  HCTZ 25 mg daily  Metoprolol XL 25 mg daily Valsartan 320 mg daily   Patient verbally confirms he is taking the above medications as directed. {yes/no:20286}  How often are you checking your Blood Pressure? {CHL HP BP Monitoring Frequency:[id number]}  he checks his blood pressure {timing:25218} {before/after:25217} taking his medication.  Current home BP readings: ***  DATE:             BP               PULSE   Wrist or arm cuff: Caffeine intake: Salt intake: OTC medications including pseudoephedrine or NSAIDs?  Any readings above 180/100? {yes/no:20286} If yes any symptoms of hypertensive emergency? {hypertensive emergency symptoms:25354}  What recent interventions/DTPs have been made by any provider to improve Blood Pressure control since last CPP Visit: None ID  Any recent hospitalizations or ED visits since last visit with CPP? No  What diet changes have been made to improve Blood Pressure Control?  ***  What exercise is being done to improve your Blood Pressure Control?  ***  Heart Failure:  Do you monitor your weight every day?   Have you noticed any changes in your weight or fluid status ( Greater than 2 pounds in one day; 5 pounds in one week) ?  How often are you taking your fluid pill (Furosemide, Torsemide, or Bumetanide) ?     Adherence Review: Is the patient currently on ACE/ARB medication? Yes Does the patient have >5 day gap between last estimated fill dates? No  Care Gaps: Shingrix Vaccine COVID-19 Vaccine  Star Rating Drugs:  Rosuvastatin 20 mg Last Fill: 04/28/2023 90 Day Supply at [facility]. Valsartan 320 mg Last Fill: 03/28/2023 90 Day Supply at [facility].  Chart Updates: Recent office visits:  None ID  Recent  consult visits:  04/01/2023 Dr. Lequita Halt MD [facility]) No Medications changes noted 03/30/2023 Dr. Vickey Huger MD (Neurology) No Medications changes noted, follow up 5 months  Hospital visits:  None in previous 6 months  Medications: Outpatient Encounter Medications as of 05/06/2023  Medication Sig   Ascorbic Acid (VITAMIN C) 1000 MG tablet Take 1,000 mg by mouth 2 (two) times a week.   aspirin EC 81 MG tablet Take 1 tablet (81 mg total) by mouth daily. Swallow whole.   cetirizine (ZYRTEC) 10 MG tablet Take 1 tablet by mouth once daily   hydrochlorothiazide (HYDRODIURIL) 25 MG tablet Take 1 tablet (25 mg total) by mouth daily.   latanoprost (XALATAN) 0.005 % ophthalmic solution Place 1 drop into both eyes at bedtime.   MAGNESIUM PO Take 400 mg by mouth at bedtime.   metoprolol succinate (TOPROL XL) 25 MG 24 hr tablet Take 1 tablet (25 mg total) by mouth daily.   Multiple Vitamin (MULTIVITAMIN WITH MINERALS) TABS tablet Take 1 tablet by mouth daily.   naproxen (NAPROSYN) 500 MG tablet Take 500 mg by mouth 2 (two) times daily.   nitroGLYCERIN (NITROSTAT) 0.4 MG SL tablet Place 1 tablet (0.4 mg total) under the tongue every 5 (five) minutes as needed for chest pain.   Olopatadine-Mometasone (RYALTRIS) X543819 MCG/ACT SUSP Place 1-2 sprays into the nose in the morning and at bedtime.   pantoprazole (PROTONIX) 40 MG tablet Take 1 tablet (40 mg total) by mouth daily.   Polyvinyl Alcohol-Povidone (REFRESH OP) Place 1 drop  into the left eye 2 (two) times daily as needed (dryness).   psyllium (METAMUCIL) 58.6 % powder Take 1 packet by mouth daily.   rosuvastatin (CRESTOR) 20 MG tablet Take 1 tablet by mouth once daily   tadalafil (CIALIS) 20 MG tablet Take 1 tablet (20 mg total) by mouth daily as needed for erectile dysfunction.   tamsulosin (FLOMAX) 0.4 MG CAPS capsule Take 1 capsule (0.4 mg total) by mouth daily.   valsartan (DIOVAN) 320 MG tablet Take 1 tablet (320 mg total) by mouth daily.   No  facility-administered encounter medications on file as of 05/06/2023.    Recent Office Vitals: BP Readings from Last 3 Encounters:  03/30/23 (!) 147/65  02/09/23 108/66  02/08/23 122/70   Pulse Readings from Last 3 Encounters:  03/30/23 73  02/09/23 74  02/08/23 60    Wt Readings from Last 3 Encounters:  03/30/23 174 lb (78.9 kg)  02/09/23 173 lb (78.5 kg)  02/08/23 174 lb 12.8 oz (79.3 kg)     Kidney Function Lab Results  Component Value Date/Time   CREATININE 1.05 09/05/2022 03:12 PM   CREATININE 0.97 08/10/2022 11:51 AM   GFR 78.54 04/22/2022 10:30 AM   GFRNONAA >60 09/05/2022 03:12 PM   GFRAA >60 07/18/2020 04:18 AM       Latest Ref Rng & Units 09/05/2022    3:12 PM 08/10/2022   11:51 AM 05/21/2022    3:35 PM  BMP  Glucose 70 - 99 mg/dL 96  89  161   BUN 8 - 23 mg/dL 24  21  25    Creatinine 0.61 - 1.24 mg/dL 0.96  0.45  4.09   BUN/Creat Ratio 10 - 24  22  28    Sodium 135 - 145 mmol/L 140  138  138   Potassium 3.5 - 5.1 mmol/L 4.5  4.4  4.0   Chloride 98 - 111 mmol/L 102  99  103   CO2 22 - 32 mmol/L 31  23  20    Calcium 8.9 - 10.3 mg/dL 8.9  9.3  9.6      [facility] Clinical Pharmacist Assistant [phone]

## 2023-05-09 DIAGNOSIS — H401122 Primary open-angle glaucoma, left eye, moderate stage: Secondary | ICD-10-CM | POA: Diagnosis not present

## 2023-05-09 DIAGNOSIS — H401111 Primary open-angle glaucoma, right eye, mild stage: Secondary | ICD-10-CM | POA: Diagnosis not present

## 2023-05-10 ENCOUNTER — Other Ambulatory Visit: Payer: Self-pay | Admitting: Interventional Cardiology

## 2023-05-28 ENCOUNTER — Other Ambulatory Visit: Payer: Self-pay | Admitting: Family Medicine

## 2023-06-01 ENCOUNTER — Ambulatory Visit (INDEPENDENT_AMBULATORY_CARE_PROVIDER_SITE_OTHER): Payer: Medicare Other | Admitting: Internal Medicine

## 2023-06-01 ENCOUNTER — Encounter: Payer: Self-pay | Admitting: Internal Medicine

## 2023-06-01 VITALS — BP 128/78 | HR 70 | Temp 98.4°F | Ht 65.0 in | Wt 175.0 lb

## 2023-06-01 DIAGNOSIS — U071 COVID-19: Secondary | ICD-10-CM | POA: Diagnosis not present

## 2023-06-01 LAB — POC COVID19 BINAXNOW: SARS Coronavirus 2 Ag: POSITIVE — AB

## 2023-06-01 MED ORDER — HYDROCODONE BIT-HOMATROP MBR 5-1.5 MG/5ML PO SOLN
5.0000 mL | Freq: Three times a day (TID) | ORAL | 0 refills | Status: DC | PRN
Start: 2023-06-01 — End: 2023-06-08

## 2023-06-01 MED ORDER — NIRMATRELVIR/RITONAVIR (PAXLOVID)TABLET
3.0000 | ORAL_TABLET | Freq: Two times a day (BID) | ORAL | 0 refills | Status: DC
Start: 2023-06-01 — End: 2023-06-06

## 2023-06-01 MED ORDER — ALBUTEROL SULFATE HFA 108 (90 BASE) MCG/ACT IN AERS
2.0000 | INHALATION_SPRAY | Freq: Four times a day (QID) | RESPIRATORY_TRACT | 0 refills | Status: DC | PRN
Start: 2023-06-01 — End: 2023-07-01

## 2023-06-01 NOTE — Patient Instructions (Signed)
COVID-19 positive:   Patient instructed to hold Rosuvastatin (Crestor) while taking paxlovid. He/she verbalized understanding.   Vitamin Regimen:  Vitamin C 500mg  twice daily  Vitamin D 5000 units once daily  Zinc 50-75mg  once daily   Over the counter Medications:  Aspirin 81mg  per day * unless allergic or contraindicated*  Use Tylenol (acetaminophen) for fever *unless allergic or contraindicated*    Non-Medication Therapy:  Drink plenty of fluids, warm if possible.  A teaspoon of honey may help ease coughing symptoms.  Cough drops or hard candy for coughing.   Over the Counter Medication Therapy:  Use a cough expectorant such as guaifenesin (Mucinex) if recommended by your doctor for a wet, congested cough. If you have high blood pressure, please ask your doctor first before using this.  Use a cough suppressant such as dextromethorphan (Robitussin/Delsym) for a dry cough. If you have high blood pressure, please ask your doctor first before using this.  If you have high blood pressure, medication such as Coricidin HBP is safe to take for your cough and will not increase your blood pressure.    Stay home and away from others until symptoms are improving and fever free for 24 hours without the use of tylenol or ibuprofen. If symptoms have improved and fever resolved, then can be in public setting, but should wear a mask around others, practice safe distancing, and perform hand hygiene for an additional 5 days.  If symptoms worsen, or the patient develop shortness of breath, pulse oximeter reading of < 90%, or chest pain, seek immediate care at nearest emergency room or call 911.

## 2023-06-01 NOTE — Progress Notes (Signed)
[facility] [address] [place] [address] Dept: [phone] Dept Fax: [phone]  Acute Care Office Visit  Subjective:   David Goodman 07-May-1941 06/01/2023  Chief Complaint  Patient presents with   Cough    Pain in stomach, chest and ribs from coughing started 2 days    Nasal Congestion    HPI: Discussed the use of AI scribe software for clinical note transcription with the patient, who gave verbal consent to proceed.  History of Present Illness   The patient, with a history of bronchitis, presents with a two-day history of cough, sore throat, and runny nose. The cough is dry and has been causing sleep disturbances due to its severity. They also report shortness of breath when walking around and coughing. The patient denies fever or chills but admits to chest pain and soreness in the ribcage, which they attribute to the persistent coughing. They have not been around anyone who has been sick recently. Over-the-counter remedies such as Tylenol, cold and flu medication, and honey have been used to manage the symptoms. The patient recently traveled from [place] to [place] and admits to not wearing a mask during the journey. They tested negative for COVID-19 via home test kit the previous night but tested positive during the current visit.      The following portions of the patient's history were reviewed and updated as appropriate: past medical history, past surgical history, family history, social history, allergies, medications, and problem list.   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 09/09/2022    Gait instability 09/09/2022   Acute right-sided low back pain without sciatica 09/03/2022   Nonallergic rhinitis 08/04/2022   Iron deficiency anemia due to chronic blood loss 07/22/2022   Personal history of colonic polyps 07/22/2022   Gastro-esophageal reflux disease with esophagitis 07/22/2022   Gastric polyp 07/22/2022   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   GIB (gastrointestinal bleeding) 04/11/2022   OSA on CPAP 12/13/2021   Vivid dream 09/21/2021   Excessive daytime sleepiness 09/21/2021   Snoring 09/21/2021   Coronary artery disease involving native coronary artery of native heart 09/21/2021   SOBOE (shortness of breath on exertion) 09/21/2021   Coronary artery disease    HFrEF (heart failure with reduced ejection fraction) (HCC)    Bell's palsy, left 06/05/2021   Cerebrovascular disease 06/05/2021   Hiatal hernia 05/29/2021   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   Erectile dysfunction due to arterial insufficiency 02/27/2021   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   Hemorrhoids 07/17/2020   Syncope 11/15/2018   Diverticulitis 05/25/2018   Osteoarthritis of right knee 12/13/2017   Osteoarthritis of left knee 12/13/2017   Testosterone deficiency 03/22/2017   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/27/2016   BPH (benign prostatic hyperplasia) 06/18/2013   Viral URI with cough 09/03/2008   HLD (hyperlipidemia) 07/13/2007   Essential hypertension 07/13/2007   Allergic rhinitis 07/13/2007   Peptic ulcer disease 07/13/2007   History of nephrolithiasis 07/13/2007   Past Medical History:  Diagnosis Date   ALLERGIC RHINITIS 07/13/2007   Aorta dilated on Echo; Normal sized Aorta on CT  Chest CT 1/23: No thoracic aortic aneurysm; coronary calcifications; aortic atherosclerosis; improved aeration of lungs with persistent minimal reticular opacities   CAD (coronary artery disease)    GERD  (gastroesophageal reflux disease)    GI bleed    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)    HYPERLIPIDEMIA 07/13/2007   HYPERTENSION 07/13/2007   Impaired glucose tolerance 09/27/2016   Iron deficiency anemia due to chronic blood loss 02/27/2021   NEPHROLITHIASIS, 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   NSVT (nonsustained ventricular tachycardia) (HCC)    PEPTIC ULCER DISEASE 07/13/2007   PVC's (premature ventricular contractions)    SVT (supraventricular tachycardia)    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   Past Surgical History:  Procedure Laterality Date   BROW LIFT Bilateral 11/10/2020   Procedure: BLEPHAROPLASTY;  Surgeon: Allena Napoleon, MD;  Location: [facility];  Service: Plastics;  Laterality: Bilateral;  1 hour   COLONOSCOPY N/A 04/12/2022   Procedure: COLONOSCOPY;  Surgeon: Willis Modena, MD;  Location: [facility];  Service: Gastroenterology;  Laterality: N/A;   COLONOSCOPY WITH PROPOFOL N/A 02/17/2019   Procedure: COLONOSCOPY WITH PROPOFOL;  Surgeon: [name], MD;  Location: [facility];  Service: Gastroenterology;  Laterality: N/A;   CORONARY STENT INTERVENTION N/A 08/25/2021   Procedure: CORONARY STENT INTERVENTION;  Surgeon: Corky Crafts, MD;  Location[facility] INVASIVE CV LAB;  Service: Cardiovascular;  Laterality: N/A;   CORONARY ULTRASOUND/IVUS N/A 08/25/2021   Procedure: Intravascular Ultrasound/IVUS;  Surgeon: Corky Crafts, MD;  Location: [facility] INVASIVE CV LAB;  Service: Cardiovascular;  Laterality: N/A;   HYDROCELE EXCISION Right 05/15/2003   IR ANGIOGRAM VISCERAL SELECTIVE  02/16/2019   IR ANGIOGRAM VISCERAL SELECTIVE  02/16/2019   IR ANGIOGRAM VISCERAL SELECTIVE  02/16/2019   IR US GUIDE VASC ACCESS RIGHT  02/16/2019   LEFT HEART CATH AND CORONARY ANGIOGRAPHY N/A 07/28/2021   Procedure: LEFT HEART CATH AND CORONARY ANGIOGRAPHY;  Surgeon: Corky Crafts, MD;  Location: [facility] INVASIVE CV LAB;  Service: Cardiovascular;  Laterality:  N/A;   LEFT HEART CATH AND CORONARY ANGIOGRAPHY N/A 08/25/2021   Procedure: LEFT HEART CATH AND CORONARY ANGIOGRAPHY;  Surgeon: Corky Crafts, MD;  Location: [facility] INVASIVE CV LAB;  Service: Cardiovascular;  Laterality: N/A;   PERIPHERAL INTRAVASCULAR LITHOTRIPSY  08/25/2021   Procedure: INTRAVASCULAR LITHOTRIPSY;  Surgeon: Corky Crafts, MD;  Location: [facility] INVASIVE CV LAB;  Service: Cardiovascular;;   ROTATOR CUFF REPAIR     SPERMATOCELECTOMY Right 05/15/2003   Family History  Problem Relation Age of Onset   Cancer Brother    Outpatient Medications Prior to Visit  Medication Sig Dispense Refill   Ascorbic Acid (VITAMIN C) 1000 MG tablet Take 1,000 mg by mouth 2 (two) times a week.     aspirin EC 81 MG tablet Take 1 tablet (81 mg total) by mouth daily. Swallow whole. 90 tablet 3   cetirizine (ZYRTEC) 10 MG tablet Take 1 tablet by mouth once daily 30 tablet 1   hydrochlorothiazide (HYDRODIURIL) 25 MG tablet Take 1 tablet (25 mg total) by mouth daily. 90 tablet 3   latanoprost (XALATAN) 0.005 % ophthalmic solution Place 1 drop into both eyes at bedtime. 7.5 mL 1   MAGNESIUM PO Take 400 mg by mouth at bedtime.     Multiple Vitamin (MULTIVITAMIN WITH MINERALS) TABS tablet Take 1 tablet by mouth daily.     naproxen (NAPROSYN) 500 MG tablet Take 500 mg by mouth 2 (two) times daily.     nitroGLYCERIN (NITROSTAT) 0.4 MG SL  tablet Place 1 tablet (0.4 mg total) under the tongue every 5 (five) minutes as needed for chest pain. 25 tablet 3   Olopatadine-Mometasone (RYALTRIS) 665-25 MCG/ACT SUSP Place 1-2 sprays into the nose in the morning and at bedtime. 29 g 5   pantoprazole (PROTONIX) 40 MG tablet Take 1 tablet by mouth once daily 90 tablet 0   Polyvinyl Alcohol-Povidone (REFRESH OP) Place 1 drop into the left eye 2 (two) times daily as needed (dryness).     psyllium (METAMUCIL) 58.6 % powder Take 1 packet by mouth daily.     rosuvastatin (CRESTOR) 20 MG tablet Take 1 tablet by mouth once  daily 90 tablet 3   tadalafil (CIALIS) 20 MG tablet Take 1 tablet (20 mg total) by mouth daily as needed for erectile dysfunction. 30 tablet 2   valsartan (DIOVAN) 320 MG tablet Take 1 tablet (320 mg total) by mouth daily. 90 tablet 3   metoprolol succinate (TOPROL XL) 25 MG 24 hr tablet Take 1 tablet (25 mg total) by mouth daily. 90 tablet 3   tamsulosin (FLOMAX) 0.4 MG CAPS capsule TAKE 1 CAPSULE BY MOUTH DAILY (Patient not taking: Reported on 06/01/2023) 100 capsule 2   No facility-administered medications prior to visit.   No Known Allergies   ROS: A complete ROS was performed with pertinent positives/negatives noted in the HPI. The remainder of the ROS are negative.    Objective:   Today's Vitals   06/01/23 1440  BP: 128/78  Pulse: 70  Temp: 98.4 F (36.9 C)  TempSrc: Oral  SpO2: 98%  Weight: 175 lb (79.4 kg)  Height: 5\' 5"  (1.651 m)    GENERAL: Well-appearing, in NAD. Well nourished.  SKIN: Pink, warm and dry. No rash, lesion, ulceration, or ecchymoses.  NECK: Trachea midline. Full ROM w/o pain or tenderness. No lymphadenopathy.  RESPIRATORY: Chest wall symmetrical. Respirations even and non-labored. Breath sounds clear to auscultation bilaterally. (+) cough CARDIAC: S1, S2 present, regular rate and rhythm. Peripheral pulses 2+ bilaterally.  EXTREMITIES: Without clubbing, cyanosis, or edema.  NEUROLOGIC: Steady, even gait.  PSYCH/MENTAL STATUS: Alert, oriented x 3. Cooperative, appropriate mood and affect.    Results for orders placed or performed in visit on 06/01/23  POC COVID-19  Result Value Ref Range   SARS Coronavirus 2 Ag Positive (A) Negative      Assessment & Plan:  Assessment and Plan   1. COVID-19 - POC COVID-19 - nirmatrelvir/ritonavir (PAXLOVID) 20 x 150 MG & 10 x 100MG  TABS; Take 3 tablets by mouth 2 (two) times daily for 5 days. (Take nirmatrelvir 150 mg two tablets twice daily for 5 days and ritonavir 100 mg one tablet twice daily for 5 days) Patient  GFR is > 60  Dispense: 30 tablet; Refill: 0 - HYDROcodone bit-homatropine (HYCODAN) 5-1.5 MG/5ML syrup; Take 5 mLs by mouth every 8 (eight) hours as needed for cough.  Dispense: 120 mL; Refill: 0 - albuterol (VENTOLIN HFA) 108 (90 Base) MCG/ACT inhaler; Inhale 2 puffs into the lungs every 6 (six) hours as needed for wheezing or shortness of breath.  Dispense: 8 g; Refill: 0 Patient instructed to hold Rosuvastatin while taking paxlovid. Side effects and safe use of medication reviewed with patient. He/she verbalized understanding.   Vitamin Regimen:  Vitamin C 500mg  twice daily  Vitamin D 5000 units once daily  Zinc 50-75mg  once daily   Over the counter Medications:  Aspirin 81mg  per day * unless allergic or contraindicated*  Use Tylenol (acetaminophen) for fever *unless  allergic or contraindicated*    Non-Medication Therapy:  Drink plenty of fluids, warm if possible.  A teaspoon of honey may help ease coughing symptoms.  Cough drops or hard candy for coughing.   Over the Counter Medication Therapy:  Use a cough expectorant such as guaifenesin (Mucinex) if recommended by your doctor for a wet, congested cough. If you have high blood pressure, please ask your doctor first before using this.  Use a cough suppressant such as dextromethorphan (Robitussin/Delsym) for a dry cough. If you have high blood pressure, please ask your doctor first before using this.  If you have high blood pressure, medication such as Coricidin HBP is safe to take for your cough and will not increase your blood pressure.    Stay home and away from others until symptoms are improving and fever free for 24 hours without the use of tylenol or ibuprofen. If symptoms have improved and fever resolved, then can be in public setting, but should wear a mask around others, practice safe distancing, and perform hand hygiene for an additional 5 days.  If symptoms worsen, or the patient develop shortness of breath, pulse oximeter  reading of < 90%, or chest pain, seek immediate care at nearest emergency room or call 911.     Meds ordered this encounter  Medications   nirmatrelvir/ritonavir (PAXLOVID) 20 x 150 MG & 10 x 100MG  TABS    Sig: Take 3 tablets by mouth 2 (two) times daily for 5 days. (Take nirmatrelvir 150 mg two tablets twice daily for 5 days and ritonavir 100 mg one tablet twice daily for 5 days) Patient GFR is > 60    Dispense:  30 tablet    Refill:  0    Order Specific Question:   Supervising Provider    Answer:   Garnette Gunner [[id number]]   HYDROcodone bit-homatropine (HYCODAN) 5-1.5 MG/5ML syrup    Sig: Take 5 mLs by mouth every 8 (eight) hours as needed for cough.    Dispense:  120 mL    Refill:  0    Order Specific Question:   Supervising Provider    Answer:   Garnette Gunner [[id number]]   albuterol (VENTOLIN HFA) 108 (90 Base) MCG/ACT inhaler    Sig: Inhale 2 puffs into the lungs every 6 (six) hours as needed for wheezing or shortness of breath.    Dispense:  8 g    Refill:  0    Order Specific Question:   Supervising Provider    Answer:   Garnette Gunner [[id number]]   Lab Orders         POC COVID-19     No images are attached to the encounter or orders placed in the encounter.  Return if symptoms worsen or fail to improve.   Of note, portions of this note may have been created with voice recognition software Physicist, medical). While this note has been edited for accuracy, occasional wrong-word or 'sound-a-like' substitutions may have occurred due to the inherent limitations of voice recognition software.   Salvatore Decent, FNP

## 2023-06-06 ENCOUNTER — Ambulatory Visit (INDEPENDENT_AMBULATORY_CARE_PROVIDER_SITE_OTHER): Payer: Medicare Other | Admitting: Family Medicine

## 2023-06-06 ENCOUNTER — Encounter: Payer: Self-pay | Admitting: Family Medicine

## 2023-06-06 VITALS — BP 136/80 | HR 64 | Temp 97.0°F | Ht 65.0 in | Wt 172.0 lb

## 2023-06-06 DIAGNOSIS — Z01818 Encounter for other preprocedural examination: Secondary | ICD-10-CM

## 2023-06-06 DIAGNOSIS — I10 Essential (primary) hypertension: Secondary | ICD-10-CM

## 2023-06-06 DIAGNOSIS — I2510 Atherosclerotic heart disease of native coronary artery without angina pectoris: Secondary | ICD-10-CM

## 2023-06-06 DIAGNOSIS — I5020 Unspecified systolic (congestive) heart failure: Secondary | ICD-10-CM

## 2023-06-06 DIAGNOSIS — M1712 Unilateral primary osteoarthritis, left knee: Secondary | ICD-10-CM | POA: Diagnosis not present

## 2023-06-06 DIAGNOSIS — U071 COVID-19: Secondary | ICD-10-CM

## 2023-06-06 LAB — COMPREHENSIVE METABOLIC PANEL
ALT: 19 U/L (ref 0–53)
AST: 20 U/L (ref 0–37)
Albumin: 4.6 g/dL (ref 3.5–5.2)
Alkaline Phosphatase: 49 U/L (ref 39–117)
BUN: 19 mg/dL (ref 6–23)
CO2: 28 mEq/L (ref 19–32)
Calcium: 9.8 mg/dL (ref 8.4–10.5)
Chloride: 98 mEq/L (ref 96–112)
Creatinine, Ser: 1.02 mg/dL (ref 0.40–1.50)
GFR: 68.85 mL/min (ref >60.00)
Glucose, Bld: 99 mg/dL (ref 70–99)
Potassium: 4.4 mEq/L (ref 3.5–5.1)
Sodium: 136 mEq/L (ref 135–145)
Total Bilirubin: 0.5 mg/dL (ref 0.2–1.2)
Total Protein: 7.2 g/dL (ref 6.0–8.3)

## 2023-06-06 LAB — CBC
HCT: 43.6 % (ref 39.0–52.0)
Hemoglobin: 14.6 g/dL (ref 13.0–17.0)
MCHC: 33.4 g/dL (ref 30.0–36.0)
MCV: 85.8 fl (ref 78.0–100.0)
Platelets: 274 10*3/uL (ref 150.0–400.0)
RBC: 5.08 Mil/uL (ref 4.22–5.81)
RDW: 14.5 % (ref 11.5–15.5)
WBC: 7.5 10*3/uL (ref 4.0–10.5)

## 2023-06-06 NOTE — Assessment & Plan Note (Addendum)
Stable. Continue aspirin 81 mg daily and metoprolol succinate 25 mg daily. Cardiology to advise about preoperative cardiac risk.

## 2023-06-06 NOTE — Progress Notes (Signed)
[facility] [address] [place] [address] Dept: [phone] Dept Fax: [phone]  Office Visit  Subjective:    [name], male    DOB: 15-Mar-1941, 82 y.o..   MRN: [id number]  Chief Complaint  Patient presents with   Pre-op Exam    Pre-op clearance.  Handicap placard to be filled out  and wants refill on cough medicine.    History of Present Illness:  Patient is in today for preoperative assessment. He is scheduled for a left total knee joint replacement on 07/11/2023 by Dr. Despina Hick (orthopedics) due to advanced osteoarthritis of the knee. Plans for spinal anesthesia.  David Goodman has a history of hypertension, CAD, and HFrEF. He is managed on a daily aspirin, metoprolol succinate 25 mg daily, hydrochlorothiazide 25 mg daily and valsartan 320 mg daily. His elevated lipids are managed with rosuvastatin 20 mg daily.  David Goodman was developed a cough, sore throat, and runny nose about a week ago. He was diagnosed with COVID on 7/3. He was managed with Paxlovid, hydrocodone cough syrup, and an albuterol inhaler. He continues to have some fatigue, nasal congestion, ear congestion, and cough productive of mucous.  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 09/09/2022   Gait instability 09/09/2022   Acute right-sided low back pain without sciatica 09/03/2022   Nonallergic rhinitis 08/04/2022   Personal history of colonic polyps 07/22/2022   Gastro-esophageal reflux disease with esophagitis 07/22/2022   Gastric polyp 07/22/2022   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   GIB  (gastrointestinal bleeding) 04/11/2022   OSA on CPAP 12/13/2021   Vivid dream 09/21/2021   Excessive daytime sleepiness 09/21/2021   Snoring 09/21/2021   Coronary artery disease involving native coronary artery of native heart 09/21/2021   SOBOE (shortness of breath on exertion) 09/21/2021   HFrEF (heart failure with reduced ejection fraction) (HCC)    Bell's palsy, left 06/05/2021   Cerebrovascular disease 06/05/2021   Hiatal hernia 05/29/2021   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   Erectile dysfunction due to arterial insufficiency 02/27/2021   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   Hemorrhoids 07/17/2020   Syncope 11/15/2018   Diverticulitis 05/25/2018   Osteoarthritis of right knee 12/13/2017   Osteoarthritis of left knee 12/13/2017   Testosterone deficiency 03/22/2017   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/27/2016   BPH (benign prostatic hyperplasia) 06/18/2013   Viral URI with cough 09/03/2008   HLD (hyperlipidemia) 07/13/2007   Essential hypertension 07/13/2007   Allergic rhinitis 07/13/2007   Peptic ulcer disease 07/13/2007   History of nephrolithiasis 07/13/2007   Past Surgical History:  Procedure Laterality Date   BROW LIFT Bilateral 11/10/2020   Procedure: BLEPHAROPLASTY;  Surgeon: Allena Napoleon, MD;  Location: [facility];  Service: Plastics;  Laterality: Bilateral;  1 hour   COLONOSCOPY N/A 04/12/2022   Procedure: COLONOSCOPY;  Surgeon: Willis Modena, MD;  Location: [facility];  Service: Gastroenterology;  Laterality: N/A;   COLONOSCOPY WITH PROPOFOL N/A 02/17/2019   Procedure: COLONOSCOPY WITH PROPOFOL;  Surgeon: [name], MD;  Location: [facility];  Service: Gastroenterology;  Laterality: N/A;  CORONARY STENT INTERVENTION N/A 08/25/2021   Procedure: CORONARY STENT INTERVENTION;  Surgeon: Corky Crafts, MD;  Location: [facility] INVASIVE CV LAB;  Service:  Cardiovascular;  Laterality: N/A;   CORONARY ULTRASOUND/IVUS N/A 08/25/2021   Procedure: Intravascular Ultrasound/IVUS;  Surgeon: Corky Crafts, MD;  Location: [facility] INVASIVE CV LAB;  Service: Cardiovascular;  Laterality: N/A;   HYDROCELE EXCISION Right 05/15/2003   IR ANGIOGRAM VISCERAL SELECTIVE  02/16/2019   IR ANGIOGRAM VISCERAL SELECTIVE  02/16/2019   IR ANGIOGRAM VISCERAL SELECTIVE  02/16/2019   IR US GUIDE VASC ACCESS RIGHT  02/16/2019   LEFT HEART CATH AND CORONARY ANGIOGRAPHY N/A 07/28/2021   Procedure: LEFT HEART CATH AND CORONARY ANGIOGRAPHY;  Surgeon: Corky Crafts, MD;  Location: [facility] INVASIVE CV LAB;  Service: Cardiovascular;  Laterality: N/A;   LEFT HEART CATH AND CORONARY ANGIOGRAPHY N/A 08/25/2021   Procedure: LEFT HEART CATH AND CORONARY ANGIOGRAPHY;  Surgeon: Corky Crafts, MD;  Location: [facility] INVASIVE CV LAB;  Service: Cardiovascular;  Laterality: N/A;   PERIPHERAL INTRAVASCULAR LITHOTRIPSY  08/25/2021   Procedure: INTRAVASCULAR LITHOTRIPSY;  Surgeon: Corky Crafts, MD;  Location: [facility] INVASIVE CV LAB;  Service: Cardiovascular;;   ROTATOR CUFF REPAIR     SPERMATOCELECTOMY Right 05/15/2003   Family History  Problem Relation Age of Onset   Cancer Brother    Outpatient Medications Prior to Visit  Medication Sig Dispense Refill   albuterol (VENTOLIN HFA) 108 (90 Base) MCG/ACT inhaler Inhale 2 puffs into the lungs every 6 (six) hours as needed for wheezing or shortness of breath. 8 g 0   Ascorbic Acid (VITAMIN C) 1000 MG tablet Take 1,000 mg by mouth 2 (two) times a week.     aspirin EC 81 MG tablet Take 1 tablet (81 mg total) by mouth daily. Swallow whole. 90 tablet 3   cetirizine (ZYRTEC) 10 MG tablet Take 1 tablet by mouth once daily 30 tablet 1   hydrochlorothiazide (HYDRODIURIL) 25 MG tablet Take 1 tablet (25 mg total) by mouth daily. 90 tablet 3   HYDROcodone bit-homatropine (HYCODAN) 5-1.5 MG/5ML syrup Take 5 mLs by mouth every 8 (eight) hours as needed  for cough. 120 mL 0   latanoprost (XALATAN) 0.005 % ophthalmic solution Place 1 drop into both eyes at bedtime. 7.5 mL 1   MAGNESIUM PO Take 400 mg by mouth at bedtime.     Multiple Vitamin (MULTIVITAMIN WITH MINERALS) TABS tablet Take 1 tablet by mouth daily.     naproxen (NAPROSYN) 500 MG tablet Take 500 mg by mouth 2 (two) times daily.     nitroGLYCERIN (NITROSTAT) 0.4 MG SL tablet Place 1 tablet (0.4 mg total) under the tongue every 5 (five) minutes as needed for chest pain. 25 tablet 3   Olopatadine-Mometasone (RYALTRIS) 665-25 MCG/ACT SUSP Place 1-2 sprays into the nose in the morning and at bedtime. 29 g 5   pantoprazole (PROTONIX) 40 MG tablet Take 1 tablet by mouth once daily 90 tablet 0   Polyvinyl Alcohol-Povidone (REFRESH OP) Place 1 drop into the left eye 2 (two) times daily as needed (dryness).     psyllium (METAMUCIL) 58.6 % powder Take 1 packet by mouth daily.     rosuvastatin (CRESTOR) 20 MG tablet Take 1 tablet by mouth once daily 90 tablet 3   tadalafil (CIALIS) 20 MG tablet Take 1 tablet (20 mg total) by mouth daily as needed for erectile dysfunction. 30 tablet 2   tamsulosin (FLOMAX) 0.4 MG CAPS capsule TAKE 1 CAPSULE BY MOUTH DAILY  100 capsule 2   valsartan (DIOVAN) 320 MG tablet Take 1 tablet (320 mg total) by mouth daily. 90 tablet 3   nirmatrelvir/ritonavir (PAXLOVID) 20 x 150 MG & 10 x 100MG  TABS Take 3 tablets by mouth 2 (two) times daily for 5 days. (Take nirmatrelvir 150 mg two tablets twice daily for 5 days and ritonavir 100 mg one tablet twice daily for 5 days) Patient GFR is > 60 30 tablet 0   metoprolol succinate (TOPROL XL) 25 MG 24 hr tablet Take 1 tablet (25 mg total) by mouth daily. 90 tablet 3   No facility-administered medications prior to visit.   No Known Allergies   Objective:   Today's Vitals   06/06/23 0853  BP: 136/80  Pulse: 64  Temp: (!) 97 F (36.1 C)  TempSrc: Temporal  SpO2: 97%  Weight: 172 lb (78 kg)  Height: 5\' 5"  (1.651 m)   Body  mass index is 28.62 kg/m.   General: Well developed, well nourished. No acute distress. HEENT: Normocephalic, non-traumatic. PERRL, EOMI. Conjunctiva clear. External ears normal. EAC and TMs   normal bilaterally. Nose with moderate congestion and rhinorrhea. Mucous membranes moist. Oropharynx   clear. Has upper dentures. Neck: Supple. No lymphadenopathy. No thyromegaly. Lungs: Clear to auscultation bilaterally. No wheezing, rales or rhonchi. CV: RRR without murmurs or rubs. Pulses 2+ bilaterally. Abdomen: Soft, non-tender. Bowel sounds positive, normal pitch and frequency. No hepatosplenomegaly. No   rebound or guarding. Extremities: Full ROM. Mild bilateral crepitance of knees. No joint swelling or tenderness. No edema noted. Skin: Warm and dry. No rashes. Psych: Alert and oriented. Normal mood and affect.  Health Maintenance Due  Topic Date Due   Zoster Vaccines- Shingrix (1 of 2) Never done     Assessment & Plan:   Problem List Items Addressed This Visit       Cardiovascular and Mediastinum   Essential hypertension    Blood pressure is in adequate control. Continue metoprolol succinate 25 mg daily, hydrochlorothiazide 25 mg daily, and valsartan 320 mg daily.       Relevant Orders   Comprehensive metabolic panel   HFrEF (heart failure with reduced ejection fraction) (HCC)    Compensated. Continue metoprolol succinate 25 mg daily and valsartan 320 mg daily.      Relevant Orders   CBC   Comprehensive metabolic panel   Coronary artery disease involving native coronary artery of native heart    Stable. Continue aspirin 81 mg daily and metoprolol succinate 25 mg daily. Cardiology to advise about preoperative cardiac risk.        Musculoskeletal and Integument   Osteoarthritis of left knee    Plan for surgery next month.      Other Visit Diagnoses     Preoperative examination    -  Primary   Overall at low-risk, esp. related to spinal anesthesia. I will check some  screening labs. If normal, I will clear him medically for surgery.   Relevant Orders   CBC   Comprehensive metabolic panel   [id number]       Resolving, but wiht lingering symptoms. Recommend continued expectant management. Consider using Delsym or Mucinex as needed.       Return in about 3 months (around 09/06/2023) for Reassessment.   Loyola Mast, MD

## 2023-06-06 NOTE — Assessment & Plan Note (Signed)
Plan for surgery next month.

## 2023-06-06 NOTE — Assessment & Plan Note (Signed)
Compensated. Continue metoprolol succinate 25 mg daily and valsartan 320 mg daily.

## 2023-06-06 NOTE — Assessment & Plan Note (Signed)
Blood pressure is in adequate control. Continue metoprolol succinate 25 mg daily, hydrochlorothiazide 25 mg daily, and valsartan 320 mg daily.

## 2023-06-08 ENCOUNTER — Telehealth: Payer: Self-pay | Admitting: Family Medicine

## 2023-06-08 DIAGNOSIS — U071 COVID-19: Secondary | ICD-10-CM

## 2023-06-08 MED ORDER — HYDROCODONE BIT-HOMATROP MBR 5-1.5 MG/5ML PO SOLN
5.0000 mL | Freq: Three times a day (TID) | ORAL | 0 refills | Status: DC | PRN
Start: 2023-06-08 — End: 2023-07-12

## 2023-06-08 NOTE — Telephone Encounter (Signed)
Patient notified VIA phone. Dm/cma  

## 2023-06-08 NOTE — Telephone Encounter (Signed)
Please review and advise. Dm/cma  

## 2023-06-08 NOTE — Telephone Encounter (Signed)
Pt was here on 7/8 and stated he was told that his HYDROcodone bit-homatropine (HYCODAN) 5-1.5 MG/5ML syrup [[id number]] would be refilled. He has been told by the pharmacy that it has not been called in.

## 2023-06-14 DIAGNOSIS — M25562 Pain in left knee: Secondary | ICD-10-CM | POA: Diagnosis not present

## 2023-06-14 DIAGNOSIS — M1712 Unilateral primary osteoarthritis, left knee: Secondary | ICD-10-CM | POA: Diagnosis not present

## 2023-06-14 NOTE — H&P (Cosign Needed Addendum)
TOTAL KNEE ADMISSION H&P  Patient is being admitted for left total knee arthroplasty.  Subjective:  Chief Complaint: Left knee pain.  HPI: David Goodman, 82 y.o. male has a history of pain and functional disability in the left knee due to arthritis and has failed non-surgical conservative treatments for greater than 12 weeks to include corticosteriod injections and activity modification. Onset of symptoms was gradual, starting  several  years ago with gradually worsening course since that time. The patient noted no past surgery on the left knee.  Patient currently rates pain in the left knee at 8 out of 10 with activity. Patient has night pain, worsening of pain with activity and weight bearing, pain with passive range of motion, and crepitus. Patient has evidence of  bone-on-bone in the lateral and patellofemoral compartments of the left knee  by imaging studies. There is no active infection.  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 09/09/2022   Gait instability 09/09/2022   Acute right-sided low back pain without sciatica 09/03/2022   Nonallergic rhinitis 08/04/2022   Personal history of colonic polyps 07/22/2022   Gastro-esophageal reflux disease with esophagitis 07/22/2022   Gastric polyp 07/22/2022   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   GIB (gastrointestinal bleeding) 04/11/2022   OSA on CPAP 12/13/2021   Vivid dream 09/21/2021   Excessive daytime sleepiness 09/21/2021   Snoring 09/21/2021   Coronary artery disease involving native coronary artery of native heart 09/21/2021   SOBOE (shortness of breath on exertion) 09/21/2021   HFrEF (heart failure with  reduced ejection fraction) (HCC)    Bell's palsy, left 06/05/2021   Cerebrovascular disease 06/05/2021   Hiatal hernia 05/29/2021   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   Erectile dysfunction due to arterial insufficiency 02/27/2021   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   Hemorrhoids 07/17/2020   Syncope 11/15/2018   Diverticulitis 05/25/2018   Osteoarthritis of right knee 12/13/2017   Osteoarthritis of left knee 12/13/2017   Testosterone deficiency 03/22/2017   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/27/2016   BPH (benign prostatic hyperplasia) 06/18/2013   Viral URI with cough 09/03/2008   HLD (hyperlipidemia) 07/13/2007   Essential hypertension 07/13/2007   Allergic rhinitis 07/13/2007   Peptic ulcer disease 07/13/2007   History of nephrolithiasis 07/13/2007    Past Medical History:  Diagnosis Date   ALLERGIC RHINITIS 07/13/2007   Aorta dilated on Echo; Normal sized Aorta on CT    Chest CT 1/23: No thoracic aortic aneurysm; coronary calcifications; aortic atherosclerosis; improved aeration of lungs with persistent minimal reticular opacities   CAD (coronary artery disease)    GERD (gastroesophageal reflux disease)    GI bleed    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)    HYPERLIPIDEMIA 07/13/2007   HYPERTENSION 07/13/2007   Impaired glucose tolerance 09/27/2016   Iron deficiency anemia due to chronic blood loss 02/27/2021   NEPHROLITHIASIS, 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   NSVT (nonsustained ventricular tachycardia) (HCC)    PEPTIC ULCER DISEASE 07/13/2007   PVC's (premature ventricular contractions)    SVT (supraventricular tachycardia)    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    Past Surgical History:  Procedure Laterality Date  BROW LIFT Bilateral 11/10/2020   Procedure: BLEPHAROPLASTY;  Surgeon: Allena Napoleon, MD;  Location: [facility];  Service:  Plastics;  Laterality: Bilateral;  1 hour   COLONOSCOPY N/A 04/12/2022   Procedure: COLONOSCOPY;  Surgeon: Willis Modena, MD;  Location: [facility];  Service: Gastroenterology;  Laterality: N/A;   COLONOSCOPY WITH PROPOFOL N/A 02/17/2019   Procedure: COLONOSCOPY WITH PROPOFOL;  Surgeon: [name], MD;  Location: [facility];  Service: Gastroenterology;  Laterality: N/A;   CORONARY STENT INTERVENTION N/A 08/25/2021   Procedure: CORONARY STENT INTERVENTION;  Surgeon: Corky Crafts, MD;  Location: [facility] INVASIVE CV LAB;  Service: Cardiovascular;  Laterality: N/A;   CORONARY ULTRASOUND/IVUS N/A 08/25/2021   Procedure: Intravascular Ultrasound/IVUS;  Surgeon: Corky Crafts, MD;  Location: [facility] INVASIVE CV LAB;  Service: Cardiovascular;  Laterality: N/A;   HYDROCELE EXCISION Right 05/15/2003   IR ANGIOGRAM VISCERAL SELECTIVE  02/16/2019   IR ANGIOGRAM VISCERAL SELECTIVE  02/16/2019   IR ANGIOGRAM VISCERAL SELECTIVE  02/16/2019   IR US GUIDE VASC ACCESS RIGHT  02/16/2019   LEFT HEART CATH AND CORONARY ANGIOGRAPHY N/A 07/28/2021   Procedure: LEFT HEART CATH AND CORONARY ANGIOGRAPHY;  Surgeon: Corky Crafts, MD;  Location: [facility] INVASIVE CV LAB;  Service: Cardiovascular;  Laterality: N/A;   LEFT HEART CATH AND CORONARY ANGIOGRAPHY N/A 08/25/2021   Procedure: LEFT HEART CATH AND CORONARY ANGIOGRAPHY;  Surgeon: Corky Crafts, MD;  Location: [facility] INVASIVE CV LAB;  Service: Cardiovascular;  Laterality: N/A;   PERIPHERAL INTRAVASCULAR LITHOTRIPSY  08/25/2021   Procedure: INTRAVASCULAR LITHOTRIPSY;  Surgeon: Corky Crafts, MD;  Location: [facility] INVASIVE CV LAB;  Service: Cardiovascular;;   ROTATOR CUFF REPAIR     SPERMATOCELECTOMY Right 05/15/2003    Prior to Admission medications   Medication Sig Start Date End Date Taking? Authorizing Provider  albuterol (VENTOLIN HFA) 108 (90 Base) MCG/ACT inhaler Inhale 2 puffs into the lungs every 6 (six) hours as needed for wheezing or shortness of  breath. 06/01/23   Salvatore Decent, FNP  Ascorbic Acid (VITAMIN C) 1000 MG tablet Take 1,000 mg by mouth 2 (two) times a week.    [provider]  aspirin EC 81 MG tablet Take 1 tablet (81 mg total) by mouth daily. Swallow whole. 04/28/22   Dunn, Tacey Ruiz, PA-C  cetirizine (ZYRTEC) 10 MG tablet Take 1 tablet by mouth once daily 04/19/23   Loyola Mast, MD  hydrochlorothiazide (HYDRODIURIL) 25 MG tablet Take 1 tablet (25 mg total) by mouth daily. 10/27/22   Loyola Mast, MD  HYDROcodone bit-homatropine (HYCODAN) 5-1.5 MG/5ML syrup Take 5 mLs by mouth every 8 (eight) hours as needed for cough. 06/08/23   Loyola Mast, MD  latanoprost (XALATAN) 0.005 % ophthalmic solution Place 1 drop into both eyes at bedtime. 06/20/17   Gordy Savers, MD  MAGNESIUM PO Take 400 mg by mouth at bedtime.    [provider]  metoprolol succinate (TOPROL XL) 25 MG 24 hr tablet Take 1 tablet (25 mg total) by mouth daily. 01/01/22 02/09/23  Tereso Newcomer T, PA-C  Multiple Vitamin (MULTIVITAMIN WITH MINERALS) TABS tablet Take 1 tablet by mouth daily.    [provider]  naproxen (NAPROSYN) 500 MG tablet Take 500 mg by mouth 2 (two) times daily. 07/28/22   [provider]  nitroGLYCERIN (NITROSTAT) 0.4 MG SL tablet Place 1 tablet (0.4 mg total) under the tongue every 5 (five) minutes as needed for chest pain. 07/22/21   Corky Crafts,  MD  Olopatadine-Mometasone (RYALTRIS) [phone] MCG/ACT SUSP Place 1-2 sprays into the nose in the morning and at bedtime. 10/18/22   Ellamae Sia, DO  pantoprazole (PROTONIX) 40 MG tablet Take 1 tablet by mouth once daily 05/10/23   Corky Crafts, MD  Polyvinyl Alcohol-Povidone (REFRESH OP) Place 1 drop into the left eye 2 (two) times daily as needed (dryness).    [provider]  psyllium (METAMUCIL) 58.6 % powder Take 1 packet by mouth daily.    [provider]  rosuvastatin (CRESTOR) 20 MG tablet Take 1 tablet by mouth once daily  01/24/23   Loyola Mast, MD  tadalafil (CIALIS) 20 MG tablet Take 1 tablet (20 mg total) by mouth daily as needed for erectile dysfunction. 03/06/20   Philip Aspen, Limmie Patricia, MD  tamsulosin (FLOMAX) 0.4 MG CAPS capsule TAKE 1 CAPSULE BY MOUTH DAILY 05/30/23   Loyola Mast, MD  valsartan (DIOVAN) 320 MG tablet Take 1 tablet (320 mg total) by mouth daily. 07/27/22   Corky Crafts, MD    No Known Allergies  Social History   Socioeconomic History   Marital status: Married    Spouse name: Not on file   Number of children: Not on file   Years of education: Not on file   Highest education level: 12th grade  Occupational History   Not on file  Tobacco Use   Smoking status: Never   Smokeless tobacco: Never  Vaping Use   Vaping status: Never Used  Substance and Sexual Activity   Alcohol use: Yes    Comment: couple times a week - wine   Drug use: No   Sexual activity: Not Currently  Other Topics Concern   Not on file  Social History Narrative   Lives alone   Right handed   Caffeine: 1 cup of coffee a day   Social Determinants of Health   Financial Resource Strain: Low Risk  (06/02/2023)   Overall Financial Resource Strain (CARDIA)    Difficulty of Paying Living Expenses: Not hard at all  Food Insecurity: No Food Insecurity (06/02/2023)   Hunger Vital Sign    Worried About Running Out of Food in the Last Year: Never true    Ran Out of Food in the Last Year: Never true  Transportation Needs: No Transportation Needs (06/02/2023)   PRAPARE - Administrator, Civil Service (Medical): No    Lack of Transportation (Non-Medical): No  Physical Activity: Sufficiently Active (06/02/2023)   Exercise Vital Sign    Days of Exercise per Week: 3 days    Minutes of Exercise per Session: 50 min  Stress: No Stress Concern Present (06/02/2023)   [employer] of Occupational Health - Occupational Stress Questionnaire    Feeling of Stress : Not at all  Social Connections:  Moderately Integrated (06/02/2023)   Social Connection and Isolation Panel [NHANES]    Frequency of Communication with Friends and Family: More than three times a week    Frequency of Social Gatherings with Friends and Family: Once a week    Attends Religious Services: More than 4 times per year    Active Member of [employer] or Organizations: No    Attends Banker Meetings: Not on file    Marital Status: Married  Intimate Partner Violence: Not on file    Tobacco Use: Low Risk  (06/06/2023)   Patient History    Smoking Tobacco Use: Never    Smokeless Tobacco Use: Never  Passive Exposure: Not on file   Social History   Substance and Sexual Activity  Alcohol Use Yes   Comment: couple times a week - wine    Family History  Problem Relation Age of Onset   Cancer Brother     Review of Systems  Constitutional:  Negative for chills and fever.  HENT:  Negative for congestion, sore throat and tinnitus.   Eyes:  Negative for double vision, photophobia and pain.  Respiratory:  Negative for cough, shortness of breath and wheezing.   Cardiovascular:  Negative for chest pain, palpitations and orthopnea.  Gastrointestinal:  Negative for heartburn, nausea and vomiting.  Genitourinary:  Negative for dysuria, frequency and urgency.  Musculoskeletal:  Positive for joint pain.  Neurological:  Negative for dizziness, weakness and headaches.    Objective:  Physical Exam:  Well nourished and well developed.  General: Alert and oriented x3, cooperative and pleasant, no acute distress.  Head: normocephalic, atraumatic, neck supple.  Eyes: EOMI.  Musculoskeletal:  Left knee shows trace effusion. He has a valgus deformity. His range of motion is 0-125. He is tender lateral greater than medial. There is no instability.  Calves soft and nontender. Motor function intact in LE. Strength 5/5 LE bilaterally. Neuro: Distal pulses 2+. Sensation to light touch intact in LE.  Imaging  Review Plain radiographs demonstrate severe degenerative joint disease of the left knee. The overall alignment is mild valgus. The bone quality appears to be adequate for age and reported activity level.  Assessment/Plan:  End stage arthritis, left knee   The patient history, physical examination, clinical judgment of the provider and imaging studies are consistent with end stage degenerative joint disease of the left knee and total knee arthroplasty is deemed medically necessary. The treatment options including medical management, injection therapy arthroscopy and arthroplasty were discussed at length. The risks and benefits of total knee arthroplasty were presented and reviewed. The risks due to aseptic loosening, infection, stiffness, patella tracking problems, thromboembolic complications and other imponderables were discussed. The patient acknowledged the explanation, agreed to proceed with the plan and consent was signed. Patient is being admitted for inpatient treatment for surgery, pain control, PT, OT, prophylactic antibiotics, VTE prophylaxis, progressive ambulation and ADLs and discharge planning. The patient is planning to be discharged  home .  Patient's anticipated LOS is less than 2 midnights, meeting these requirements: - Lives within 1 hour of care - Has a competent adult at home to recover with post-op recover - NO history of  - Chronic pain requiring opiods  - Diabetes  - Coronary Artery Disease  - Heart failure  - Heart attack  - Stroke  - DVT/VTE  - Cardiac arrhythmia  - Respiratory Failure/COPD  - Renal failure  - Anemia  - Advanced Liver disease  Therapy Plans: Outpatient therapy at [facility] Disposition: Home with wife Planned DVT Prophylaxis: Aspirin 81 mg BID DME Needed: Dan Humphreys PCP: Herbie Drape, MD (clearance received) Cardiologist: Lance Muss, MD (clearance received) TXA: IV Allergies: NKDA Metal Allergy: None Anesthesia Concerns: None BMI: 29.9 Last  HgbA1c: Not diabetic Pain Regimen: Has tolerated oxycodone previously Pharmacy: [facility] ([address])  - Patient was instructed on what medications to stop prior to surgery. - Follow-up visit in 2 weeks with Dr. Lequita Halt - Begin physical therapy following surgery - Pre-operative lab work as pre-surgical testing - Prescriptions will be provided in hospital at time of discharge  Arther Abbott, PA-C Orthopedic Surgery [facility] [place]

## 2023-06-20 ENCOUNTER — Other Ambulatory Visit: Payer: Self-pay | Admitting: Internal Medicine

## 2023-06-20 DIAGNOSIS — U071 COVID-19: Secondary | ICD-10-CM

## 2023-06-21 ENCOUNTER — Ambulatory Visit: Payer: Medicare Other | Attending: Cardiovascular Disease | Admitting: Nurse Practitioner

## 2023-06-21 DIAGNOSIS — Z01810 Encounter for preprocedural cardiovascular examination: Secondary | ICD-10-CM | POA: Diagnosis not present

## 2023-06-21 NOTE — Progress Notes (Signed)
Virtual Visit via Telephone Note   Because of David Goodman's co-morbid illnesses, he is at least at moderate risk for complications without adequate follow up.  This format is felt to be most appropriate for this patient at this time.  The patient did not have access to video technology/had technical difficulties with video requiring transitioning to audio format only (telephone).  All issues noted in this document were discussed and addressed.  No physical exam could be performed with this format.  Please refer to the patient's chart for his consent to telehealth for [facility].  Evaluation Performed:  Preoperative cardiovascular risk assessment _____________   Date:  06/21/2023   Patient ID:  David Goodman, DOB [birth year], MRN [id number] Patient Location:  Home Provider location:   Office  Primary Care Provider:  Loyola Mast, MD Primary Cardiologist:  Lance Muss, MD  Chief Complaint / Patient Profile   82 y.o. y/o male with a h/o CAD s/p DES-LAD in 2022, ICM, SVT, mitral valve regurgitation, hypertension, hyperlipidemia, iron deficiency anemia, and GERD who is pending left total knee arthroplasty on 07/11/2023 with Dr. Ollen Gross of [facility] and presents today for telephonic preoperative cardiovascular risk assessment.  History of Present Illness    David Goodman is a 82 y.o. male who presents via audio/video conferencing for a telehealth visit today.  Pt was last seen in cardiology clinic on 02/09/2023 by Jari Favre, PA.  At that time David Goodman was doing well.  The patient is now pending procedure as outlined above. Since his last visit, he has done well from a cardiac standpoint.  He recently started exercising and is tolerating this well.   He denies chest pain, palpitations, dyspnea, pnd, orthopnea, n, v, dizziness, syncope, edema, weight gain, or early satiety. All other systems reviewed and are otherwise negative except as noted above.   Past Medical  History    Past Medical History:  Diagnosis Date   ALLERGIC RHINITIS 07/13/2007   Aorta dilated on Echo; Normal sized Aorta on CT    Chest CT 1/23: No thoracic aortic aneurysm; coronary calcifications; aortic atherosclerosis; improved aeration of lungs with persistent minimal reticular opacities   CAD (coronary artery disease)    GERD (gastroesophageal reflux disease)    GI bleed    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)    HYPERLIPIDEMIA 07/13/2007   HYPERTENSION 07/13/2007   Impaired glucose tolerance 09/27/2016   Iron deficiency anemia due to chronic blood loss 02/27/2021   NEPHROLITHIASIS, 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   NSVT (nonsustained ventricular tachycardia) (HCC)    PEPTIC ULCER DISEASE 07/13/2007   PVC's (premature ventricular contractions)    SVT (supraventricular tachycardia)    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   Past Surgical History:  Procedure Laterality Date   BROW LIFT Bilateral 11/10/2020   Procedure: BLEPHAROPLASTY;  Surgeon: Allena Napoleon, MD;  Location:  [facility];  Service: Plastics;  Laterality: Bilateral;  1 hour   COLONOSCOPY N/A 04/12/2022   Procedure: COLONOSCOPY;  Surgeon: Willis Modena, MD;  Location: [facility];  Service: Gastroenterology;  Laterality: N/A;   COLONOSCOPY WITH PROPOFOL N/A 02/17/2019   Procedure: COLONOSCOPY WITH PROPOFOL;  Surgeon: [name], MD;  Location: [facility];  Service: Gastroenterology;  Laterality: N/A;   CORONARY STENT INTERVENTION N/A 08/25/2021   Procedure: CORONARY STENT INTERVENTION;  Surgeon: Corky Crafts, MD;  Location: [facility] INVASIVE CV LAB;  Service: Cardiovascular;  Laterality: N/A;   CORONARY ULTRASOUND/IVUS N/A 08/25/2021   Procedure: Intravascular Ultrasound/IVUS;  Surgeon: Corky Crafts, MD;  Location: [facility] INVASIVE CV LAB;  Service: Cardiovascular;  Laterality: N/A;   HYDROCELE EXCISION Right 05/15/2003   IR ANGIOGRAM VISCERAL SELECTIVE  02/16/2019   IR  ANGIOGRAM VISCERAL SELECTIVE  02/16/2019   IR ANGIOGRAM VISCERAL SELECTIVE  02/16/2019   IR US GUIDE VASC ACCESS RIGHT  02/16/2019   LEFT HEART CATH AND CORONARY ANGIOGRAPHY N/A 07/28/2021   Procedure: LEFT HEART CATH AND CORONARY ANGIOGRAPHY;  Surgeon: Corky Crafts, MD;  Location: [facility] INVASIVE CV LAB;  Service: Cardiovascular;  Laterality: N/A;   LEFT HEART CATH AND CORONARY ANGIOGRAPHY N/A 08/25/2021   Procedure: LEFT HEART CATH AND CORONARY ANGIOGRAPHY;  Surgeon: Corky Crafts, MD;  Location: [facility] INVASIVE CV LAB;  Service: Cardiovascular;  Laterality: N/A;   PERIPHERAL INTRAVASCULAR LITHOTRIPSY  08/25/2021   Procedure: INTRAVASCULAR LITHOTRIPSY;  Surgeon: Corky Crafts, MD;  Location: [facility] INVASIVE CV LAB;  Service: Cardiovascular;;   ROTATOR CUFF REPAIR     SPERMATOCELECTOMY Right 05/15/2003    Allergies  No Known Allergies  Home Medications    Prior to Admission medications   Medication Sig Start Date End Date Taking? Authorizing Provider  albuterol (VENTOLIN HFA) 108 (90 Base) MCG/ACT inhaler Inhale 2 puffs into the lungs every 6 (six) hours as needed for wheezing or shortness of breath. 06/01/23   Salvatore Decent, FNP  Ascorbic Acid (VITAMIN C) 1000 MG tablet Take 1,000 mg by mouth 2 (two) times a week.    [provider]  aspirin EC 81 MG tablet Take 1 tablet (81 mg total) by mouth daily. Swallow whole. 04/28/22   Dunn, Tacey Ruiz, PA-C  cetirizine (ZYRTEC) 10 MG tablet Take 1 tablet by mouth once daily 04/19/23   Loyola Mast, MD  hydrochlorothiazide (HYDRODIURIL) 25 MG tablet Take 1 tablet (25 mg total) by mouth daily. 10/27/22   Loyola Mast, MD  HYDROcodone bit-homatropine (HYCODAN) 5-1.5 MG/5ML syrup Take 5 mLs by mouth every 8 (eight) hours as needed for cough. 06/08/23   Loyola Mast, MD  latanoprost (XALATAN) 0.005 % ophthalmic solution Place 1 drop into both eyes at bedtime. 06/20/17   Gordy Savers, MD  MAGNESIUM PO Take 400 mg by mouth at  bedtime.    [provider]  metoprolol succinate (TOPROL XL) 25 MG 24 hr tablet Take 1 tablet (25 mg total) by mouth daily. 01/01/22 02/09/23  Tereso Newcomer T, PA-C  Multiple Vitamin (MULTIVITAMIN WITH MINERALS) TABS tablet Take 1 tablet by mouth daily.    [provider]  naproxen (NAPROSYN) 500 MG tablet Take 500 mg by mouth 2 (two) times daily. 07/28/22   [provider]  nitroGLYCERIN (NITROSTAT) 0.4 MG SL tablet Place 1 tablet (0.4 mg total) under the tongue every 5 (five) minutes as needed for chest pain. 07/22/21   Corky Crafts, MD  Olopatadine-Mometasone Cristal Generous) [phone] MCG/ACT SUSP Place 1-2 sprays into the nose in the morning and at bedtime. 10/18/22   Ellamae Sia, DO  pantoprazole (PROTONIX) 40 MG tablet Take 1 tablet by mouth once daily 05/10/23   Corky Crafts, MD  Polyvinyl Alcohol-Povidone (REFRESH OP) Place 1 drop into the left eye 2 (two) times daily as needed (dryness).    [provider]  psyllium (METAMUCIL) 58.6 % powder Take 1 packet by mouth daily.    [provider]  rosuvastatin (CRESTOR) 20 MG tablet Take 1 tablet by mouth once daily 01/24/23   Loyola Mast, MD  tadalafil (CIALIS) 20  MG tablet Take 1 tablet (20 mg total) by mouth daily as needed for erectile dysfunction. 03/06/20   Philip Aspen, Limmie Patricia, MD  tamsulosin (FLOMAX) 0.4 MG CAPS capsule TAKE 1 CAPSULE BY MOUTH DAILY 05/30/23   Loyola Mast, MD  valsartan (DIOVAN) 320 MG tablet Take 1 tablet (320 mg total) by mouth daily. 07/27/22   Corky Crafts, MD    Physical Exam    Vital Signs:  David Goodman does not have vital signs available for review today. Given telephonic nature of communication, physical exam is limited. AAOx3. NAD. Normal affect.  Speech and respirations are unlabored.  Accessory Clinical Findings    None  Assessment & Plan    1.  Preoperative Cardiovascular Risk Assessment:  According to the Revised Cardiac Risk Index  (RCRI), his Perioperative Risk of Major Cardiac Event is (%): 0.9. His Functional Capacity in METs is: 6.45 according to the [facility] Activity Status Index (DASI). Therefore, based on ACC/AHA guidelines, patient would be at acceptable risk for the planned procedure without further cardiovascular testing.  The patient was advised that if he develops new symptoms prior to surgery to contact our office to arrange for a follow-up visit, and he verbalized understanding.  Regarding ASA therapy, we recommend continuation of ASA throughout the perioperative period.  However, if the surgeon feels that cessation of ASA is required in the perioperative period, it may be stopped 5-7 days prior to surgery with a plan to resume it as soon as felt to be feasible from a surgical standpoint in the post-operative period.   A copy of this note will be routed to requesting surgeon.  Time:   Today, I have spent 6 minutes with the patient with telehealth technology discussing medical history, symptoms, and management plan.     Joylene Grapes, NP  06/21/2023, 10:29 AM

## 2023-06-25 ENCOUNTER — Other Ambulatory Visit: Payer: Self-pay | Admitting: Interventional Cardiology

## 2023-06-28 ENCOUNTER — Other Ambulatory Visit: Payer: Self-pay | Admitting: *Deleted

## 2023-06-30 NOTE — Progress Notes (Addendum)
COVID Vaccine received:  []  No [x]  Yes Date of any COVID positive Test in last 90 days:  COVID +  on 06-01-23, had Paxlovid per Dr. Vickii Penna note in [facility]    Patient has no symptoms today at PST  PCP - Herbie Drape, MD medical clearance in 06-06-2023 note Cardiologist - Lance Muss, MD    Bernadene Person, NP- cardiac clearance in 06-21-23 note  Chest x-ray - 09-05-2022  2v   Epic EKG -  09-06-2022  Epic Stress Test -  ECHO - 04-12-2022  Epic Cardiac Cath - 08-25-2021 [facility]- DESx1 w/ lithotripsy by Dr. Reece Leader Monitor- 12-25-2021  Epic  PCR screen: [x]  Ordered & Completed           []   No Order but Needs PROFEND           []   N/A for this surgery  Surgery Plan:  []  Ambulatory                            [x]  Outpatient in bed                            []  Admit  Anesthesia:    []  General  []  Spinal                           [x]   Choice []   MAC  Pacemaker / ICD device [x]  No []  Yes   Spinal Cord Stimulator:[x]  No []  Yes       History of Sleep Apnea? []  No [x]  Yes   CPAP used?- [x]  No []  Yes  can't tolerate it  Does the patient monitor blood sugar?          []  No []  Yes  [x]  N/A  Patient has: [x]  NO Hx DM   []  Pre-DM                 []  DM1  []   DM2  Blood Thinner / Instructions:  none Aspirin Instructions:  ASA  81mg ,  Per Cardio- prefer continuation but would agree to 5-7 day hold if surgeon desires. Per Patient, he was told by cardiology to continue his ASA 81 mg.  I spoke with Aida Raider at Dr. Deri Fuelling office and made her aware.   ERAS Protocol Ordered: []  No  [x]  Yes PRE-SURGERY [x]  ENSURE  []  G2  Patient is to be NPO after: 05:15 am  Comments: Patient was given the 5 CHG shower / bath instructions for TKA surgery along with 2 bottles of the CHG soap. Patient will start this on:  THursday  07-07-23    All questions were asked and answered, Patient voiced understanding of this process.   Activity level: Patient is able to climb a flight of stairs without difficulty; [x]  No CP   [x]  No SOB, but would have  knee pain.  Patient can perform ADLs without assistance.   Anesthesia review: CAD-DES x 1 / vessel Lithrotripsy, syncope, HFrEF, OSA- no CPAP, HTN, SVT, MR, anemia, glaucoma, HOH has HAs  Patient denies shortness of breath, fever, cough and chest pain at PAT appointment.  Patient verbalized understanding and agreement to the Pre-Surgical Instructions that were given to them at this PAT appointment. Patient was also educated of the need to review these PAT instructions again prior to his surgery.I reviewed the appropriate phone numbers to  call if they have any and questions or concerns.

## 2023-06-30 NOTE — Patient Instructions (Addendum)
SURGICAL WAITING ROOM VISITATION Patients having surgery or a procedure may have no more than 2 support people in the waiting area - these visitors may rotate in the visitor waiting room.   Due to an increase in RSV and influenza rates and associated hospitalizations, children ages 6 and under may not visit patients in [facility] hospitals. If the patient needs to stay at the hospital during part of their recovery, the visitor guidelines for inpatient rooms apply.  PRE-OP VISITATION  Pre-op nurse will coordinate an appropriate time for 1 support person to accompany the patient in pre-op.  This support person may not rotate.  This visitor will be contacted when the time is appropriate for the visitor to come back in the pre-op area.  Please refer to the [facility] website for the visitor guidelines for Inpatients (after your surgery is over and you are in a regular room).  You are not required to quarantine at this time prior to your surgery. However, you must do this: Hand Hygiene often Do NOT share personal items Notify your provider if you are in close contact with someone who has COVID or you develop fever 100.4 or greater, new onset of sneezing, cough, sore throat, shortness of breath or body aches.  If you test positive for Covid or have been in contact with anyone that has tested positive in the last 10 days please notify you surgeon.    Your procedure is scheduled on:  Monday  July 11, 2023  Report to [facility] Main Entrance: [address] entrance where the [employer] is available.   Report to admitting at:  05:45   AM  Call this number if you have any questions or problems the morning of surgery [phone]  Do not eat food after Midnight the night prior to your surgery/procedure.  After Midnight you may have the following liquids until   05:15 AM  DAY OF SURGERY  Clear Liquid Diet Water Black Coffee (sugar ok, NO MILK/CREAM OR CREAMERS)  Tea (sugar ok, NO  MILK/CREAM OR CREAMERS) regular and decaf                             Plain Jell-O  with no fruit (NO RED)                                           Fruit ices (not with fruit pulp, NO RED)                                     Popsicles (NO RED)                                                                  Juice: NO CITRUS JUICES: only apple, WHITE grape, WHITE cranberry Sports drinks like Gatorade or Powerade (NO RED)                   The day of surgery:  Drink ONE (1) Pre-Surgery Clear Ensure at   05:15   AM  the morning of surgery. Drink in one sitting. Do not sip.  This drink was given to you during your hospital pre-op appointment visit. Nothing else to drink after completing the Pre-Surgery Clear Ensure : No candy, chewing gum or throat lozenges.    FOLLOW  ANY ADDITIONAL PRE OP INSTRUCTIONS YOU RECEIVED FROM YOUR SURGEON'S OFFICE!!!   Oral Hygiene is also important to reduce your risk of infection.        Remember - BRUSH YOUR TEETH THE MORNING OF SURGERY WITH YOUR REGULAR TOOTHPASTE  Do NOT smoke after Midnight the night before surgery.  STOP TAKING all Vitamins, Herbs and supplements 1 week before your surgery.   Take ONLY these medicines the morning of surgery with A SIP OF WATER: Pantoprazole (Protonix), tamsulosin (Flomax),  You may use your Eye drops and Albuterol inhaler if needed.    If You have been diagnosed with Sleep Apnea - Bring CPAP mask and tubing day of surgery. We will provide you with a CPAP machine on the day of your surgery.                   You may not have any metal on your body including jewelry, and body piercing  Do not wear  lotions, powders, cologne, or deodorant  Men may shave face and neck.  Contacts, Hearing Aids, dentures or bridgework may not be worn into surgery. DENTURES WILL BE REMOVED PRIOR TO SURGERY PLEASE DO NOT APPLY "Poly grip" OR ADHESIVES!!!  You may bring a small overnight bag with you on the day of surgery, only pack items  that are not valuable. [facility] IS NOT RESPONSIBLE   FOR VALUABLES THAT ARE LOST OR STOLEN.    Do not bring your home medications to the hospital EXCEPT BRING YOUR ALBUTEROL INHALER. The Pharmacy will dispense medications listed on your medication list to you during your admission in the Hospital.  Special Instructions: Bring a copy of your healthcare power of attorney and living will documents the day of surgery, if you wish to have them scanned into your [facility] Records- EPIC  Please read over the following fact sheets you were given: IF YOU HAVE QUESTIONS ABOUT YOUR PRE-OP INSTRUCTIONS, PLEASE CALL [phone].         Pre-operative 5 CHG Bath Instructions   You can play a key role in reducing the risk of infection after surgery. Your skin needs to be as free of germs as possible. You can reduce the number of germs on your skin by washing with CHG (chlorhexidine gluconate) soap before surgery. CHG is an antiseptic soap that kills germs and continues to kill germs even after washing.   DO NOT use if you have an allergy to chlorhexidine/CHG or antibacterial soaps. If your skin becomes reddened or irritated, stop using the CHG and notify one of our RNs at [phone]  Please shower with the CHG soap starting 4 days before surgery using the following schedule: START SHOWERS ON THURSDAY  July 07, 2023  Please keep in mind the following:  DO NOT shave, including legs and underarms, starting the day of your first shower.   You may shave your face at any point before/day of surgery.   Place clean sheets on your bed the day you start using CHG soap. Use a clean washcloth (not used since being washed) for each shower. DO NOT sleep with pets once you start using the CHG.   CHG Shower  Instructions:  If you choose to wash your hair and private area, wash first with your normal shampoo/soap.  After you use shampoo/soap, rinse your hair and body thoroughly to remove shampoo/soap residue.  Turn the water OFF and apply about 3 tablespoons (45 ml) of CHG soap to a CLEAN washcloth.  Apply CHG soap ONLY FROM YOUR NECK DOWN TO YOUR TOES (washing for 3-5 minutes)  DO NOT use CHG soap on face, private areas, open wounds, or sores.  Pay special attention to the area where your surgery is being performed.  If you are having back surgery, having someone wash your back for you may be helpful.  Wait 2 minutes after CHG soap is applied, then you may rinse off the CHG soap.  Pat dry with a clean towel  Put on clean clothes/pajamas   If you choose to wear lotion, please use ONLY the CHG-compatible lotions on the back of this paper.     Additional instructions for the day of surgery: DO NOT APPLY any lotions, deodorants, cologne, or perfumes.   Put on clean/comfortable clothes.  Brush your teeth.  Ask your nurse before applying any prescription medications to the skin.         CHG Compatible Lotions   Aveeno Moisturizing lotion  Cetaphil Moisturizing Cream  Cetaphil Moisturizing Lotion  Clairol Herbal Essence Moisturizing Lotion, Dry Skin  Clairol Herbal Essence Moisturizing Lotion, Extra Dry Skin  Clairol Herbal Essence Moisturizing Lotion, Normal Skin  Curel Age Defying Therapeutic Moisturizing Lotion with Alpha Hydroxy  Curel Extreme Care Body Lotion  Curel Soothing Hands Moisturizing Hand Lotion  Curel Therapeutic Moisturizing Cream, Fragrance-Free  Curel Therapeutic Moisturizing Lotion, Fragrance-Free  Curel Therapeutic Moisturizing Lotion, Original Formula  Eucerin Daily Replenishing Lotion  Eucerin Dry Skin Therapy Plus Alpha Hydroxy Crme  Eucerin Dry Skin Therapy Plus Alpha Hydroxy Lotion  Eucerin Original Crme  Eucerin Original Lotion  Eucerin Plus Crme  Eucerin Plus Lotion  Eucerin TriLipid Replenishing Lotion  Keri Anti-Bacterial Hand Lotion  Keri Deep Conditioning Original Lotion Dry Skin Formula Softly Scented  Keri Deep Conditioning Original Lotion, Fragrance Free Sensitive Skin Formula  Keri Lotion Fast Absorbing Fragrance Free Sensitive Skin Formula  Keri Lotion Fast Absorbing Softly Scented Dry Skin Formula  Keri Original Lotion  Keri Skin Renewal Lotion Keri Silky Smooth Lotion  Keri Silky Smooth Sensitive Skin Lotion  Nivea Body Creamy Conditioning Oil  Nivea Body Extra Enriched Lotion  Nivea Body Original Lotion  Nivea Body Sheer Moisturizing Lotion Nivea Crme  Nivea Skin Firming Lotion  NutraDerm 30 Skin Lotion  NutraDerm Skin Lotion  NutraDerm Therapeutic Skin Cream  NutraDerm Therapeutic Skin Lotion  ProShield Protective Hand Cream  Provon moisturizing lotion   FAILURE TO FOLLOW THESE INSTRUCTIONS MAY RESULT IN THE CANCELLATION OF YOUR SURGERY  PATIENT SIGNATURE_________________________________  NURSE SIGNATURE__________________________________  ________________________________________________________________________      Rogelia Mire    An incentive spirometer is a tool that can help keep your lungs clear and active. This tool measures how well you are filling your lungs with each  breath. Taking long deep breaths may help reverse or decrease the chance of developing breathing (pulmonary) problems (especially infection) following: A long period of time when you are unable to move or be active. BEFORE THE PROCEDURE  If the spirometer includes an indicator to show your best effort, your nurse or respiratory therapist will set it to a desired goal. If possible, sit up straight or lean slightly forward. Try not to slouch. Hold the incentive spirometer in an upright position. INSTRUCTIONS FOR USE  Sit on the edge of your bed if possible, or sit up as far as you can in bed or on a chair. Hold the  incentive spirometer in an upright position. Breathe out normally. Place the mouthpiece in your mouth and seal your lips tightly around it. Breathe in slowly and as deeply as possible, raising the piston or the ball toward the top of the column. Hold your breath for 3-5 seconds or for as long as possible. Allow the piston or ball to fall to the bottom of the column. Remove the mouthpiece from your mouth and breathe out normally. Rest for a few seconds and repeat Steps 1 through 7 at least 10 times every 1-2 hours when you are awake. Take your time and take a few normal breaths between deep breaths. The spirometer may include an indicator to show your best effort. Use the indicator as a goal to work toward during each repetition. After each set of 10 deep breaths, practice coughing to be sure your lungs are clear. If you have an incision (the cut made at the time of surgery), support your incision when coughing by placing a pillow or rolled up towels firmly against it. Once you are able to get out of bed, walk around indoors and cough well. You may stop using the incentive spirometer when instructed by your caregiver.  RISKS AND COMPLICATIONS Take your time so you do not get dizzy or light-headed. If you are in pain, you may need to take or ask for pain medication before doing incentive spirometry. It is harder to take a deep breath if you are having pain. AFTER USE Rest and breathe slowly and easily. It can be helpful to keep track of a log of your progress. Your caregiver can provide you with a simple table to help with this. If you are using the spirometer at home, follow these instructions: SEEK MEDICAL CARE IF:  You are having difficultly using the spirometer. You have trouble using the spirometer as often as instructed. Your pain medication is not giving enough relief while using the spirometer. You develop fever of 100.5 F (38.1 C) or higher.                                                                                                     SEEK IMMEDIATE MEDICAL CARE IF:  You cough up bloody sputum that had not been present before. You develop fever of 102 F (38.9 C) or greater. You develop worsening pain at or near the incision site. MAKE SURE YOU:  Understand these instructions. Will watch  your condition. Will get help right away if you are not doing well or get worse. Document Released: 03/28/2007 Document Revised: 02/07/2012 Document Reviewed: 05/29/2007 [facility] Patient Information 2014 [place], [place].

## 2023-07-01 ENCOUNTER — Encounter (HOSPITAL_COMMUNITY): Payer: Self-pay

## 2023-07-01 ENCOUNTER — Other Ambulatory Visit: Payer: Self-pay

## 2023-07-01 ENCOUNTER — Encounter (HOSPITAL_COMMUNITY)
Admission: RE | Admit: 2023-07-01 | Discharge: 2023-07-01 | Disposition: A | Discharge status: Home / Self Care | Payer: Medicare Other | Source: Ambulatory Visit | Attending: Orthopedic Surgery | Admitting: Orthopedic Surgery

## 2023-07-01 VITALS — BP 136/63 | Temp 98.4°F | Resp 18 | Ht 64.5 in | Wt 172.0 lb

## 2023-07-01 DIAGNOSIS — E785 Hyperlipidemia, unspecified: Secondary | ICD-10-CM | POA: Diagnosis not present

## 2023-07-01 DIAGNOSIS — Z01812 Encounter for preprocedural laboratory examination: Secondary | ICD-10-CM | POA: Diagnosis not present

## 2023-07-01 DIAGNOSIS — I2510 Atherosclerotic heart disease of native coronary artery without angina pectoris: Secondary | ICD-10-CM | POA: Diagnosis not present

## 2023-07-01 DIAGNOSIS — I509 Heart failure, unspecified: Secondary | ICD-10-CM | POA: Diagnosis not present

## 2023-07-01 DIAGNOSIS — I110 Hypertensive heart disease with heart failure: Secondary | ICD-10-CM | POA: Diagnosis not present

## 2023-07-01 DIAGNOSIS — Z8616 Personal history of COVID-19: Secondary | ICD-10-CM | POA: Diagnosis not present

## 2023-07-01 DIAGNOSIS — K219 Gastro-esophageal reflux disease without esophagitis: Secondary | ICD-10-CM | POA: Diagnosis not present

## 2023-07-01 DIAGNOSIS — M1712 Unilateral primary osteoarthritis, left knee: Secondary | ICD-10-CM | POA: Diagnosis not present

## 2023-07-01 DIAGNOSIS — Z01818 Encounter for other preprocedural examination: Secondary | ICD-10-CM

## 2023-07-01 HISTORY — DX: Diverticulitis of intestine, part unspecified, without perforation or abscess without bleeding: K57.92

## 2023-07-01 HISTORY — DX: Heart failure, unspecified: I50.9

## 2023-07-01 HISTORY — DX: Cardiac arrhythmia, unspecified: I49.9

## 2023-07-01 LAB — BASIC METABOLIC PANEL
Anion gap: 11 (ref 5–15)
BUN: 21 mg/dL (ref 8–23)
CO2: 21 mmol/L — ABNORMAL LOW (ref 22–32)
Calcium: 8.9 mg/dL (ref 8.9–10.3)
Chloride: 102 mmol/L (ref 98–111)
Creatinine, Ser: 1.02 mg/dL (ref 0.61–1.24)
GFR, Estimated: >60 mL/min (ref >60)
Glucose, Bld: 117 mg/dL — ABNORMAL HIGH (ref 70–99)
Potassium: 3.6 mmol/L (ref 3.5–5.1)
Sodium: 134 mmol/L — ABNORMAL LOW (ref 135–145)

## 2023-07-01 LAB — CBC
HCT: 39.2 % (ref 39.0–52.0)
Hemoglobin: 13.6 g/dL (ref 13.0–17.0)
MCH: 29.6 pg (ref 26.0–34.0)
MCHC: 34.7 g/dL (ref 30.0–36.0)
MCV: 85.4 fL (ref 80.0–100.0)
Platelets: 239 10*3/uL (ref 150–400)
RBC: 4.59 MIL/uL (ref 4.22–5.81)
RDW: 14 % (ref 11.5–15.5)
WBC: 8.6 10*3/uL (ref 4.0–10.5)
nRBC: 0 % (ref 0.0–0.2)

## 2023-07-04 NOTE — Progress Notes (Signed)
Patient's PCR screen is positive for STAPH. Appropriate notes have been placed on the patient's chart. This note has been routed to Dr.Aluisio and Eartha Inch  for review. The Patient's surgery is currently scheduled for: 07-11-2023 at [facility].  Rudean Haskell, BSN, CVRN-BC   Pre-Surgical Testing Nurse [facility]  [phone]

## 2023-07-05 ENCOUNTER — Encounter (HOSPITAL_COMMUNITY): Payer: Self-pay

## 2023-07-05 NOTE — Anesthesia Preprocedure Evaluation (Addendum)
Anesthesia Evaluation   Patient awake    Reviewed: Allergy & Precautions, NPO status , Patient's Chart, lab work & pertinent test results, reviewed documented beta blocker date and time   History of Anesthesia Complications Negative for: history of anesthetic complications  Airway Mallampati: II  TM Distance: >3 FB   Mouth opening: Limited Mouth Opening  Dental  (+) Partial Upper, Partial Lower   Pulmonary sleep apnea , neg COPD   breath sounds clear to auscultation       Cardiovascular hypertension, + CAD and +CHF  (-) Past MI, (-) Cardiac Stents, (-) CABG and (-) DOE (-) dysrhythmias (-) Valvular Problems/Murmurs Rhythm:Regular Rate:Normal     Neuro/Psych neg Seizures    GI/Hepatic hiatal hernia, PUD,GERD  ,,(+) neg Cirrhosis        Endo/Other  neg diabetes    Renal/GU Renal disease     Musculoskeletal  (+) Arthritis ,    Abdominal   Peds  Hematology  (+) Blood dyscrasia, anemia   Anesthesia Other Findings   Reproductive/Obstetrics                             Anesthesia Physical Anesthesia Plan  ASA: 2  Anesthesia Plan: Spinal   Post-op Pain Management: Regional block*   Induction: Intravenous  PONV Risk Score and Plan: 1 and Ondansetron and Propofol infusion  Airway Management Planned:   Additional Equipment:   Intra-op Plan:   Post-operative Plan:   Informed Consent: I have reviewed the patients History and Physical, chart, labs and discussed the procedure including the risks, benefits and alternatives for the proposed anesthesia with the patient or authorized representative who has indicated his/her understanding and acceptance.     Dental advisory given  Plan Discussed with: CRNA  Anesthesia Plan Comments: (See PAT note from 8/2 by Sherlie Ban PA-C )        Anesthesia Quick Evaluation

## 2023-07-05 NOTE — Progress Notes (Signed)
Case: [id number] Date/Time: 07/11/23 0805   Procedure: TOTAL KNEE ARTHROPLASTY (Left: Knee)   Anesthesia type: Choice   Pre-op diagnosis: left knee osteoarthritis   Location: [facility] ROOM 10 / [facility]   Surgeons: Ollen Gross, MD       DISCUSSION: David Goodman is an 82 yo male who presents to PAT prior to surgery above. PMH significant for HTN, HLD, GERD, CAD s/p PCI to LAD in 2022, CHF, hx of SVT, arthritis  No prior anesthesia complications.  Patient follows with his PCP for chronic medical conditions. Seen on 06/06/23. Of note he was diagnosed with COVID on 7/3. This was managed as an outpatient. He denies any symptoms at PAT visit and has been >4 weeks since diagnosis. Has been cleared by PCP for surgery: "Overall at low-risk, esp. related to spinal anesthesia. I will check some screening labs. If normal, I will clear him medically for surgery."  Follows with Cardiology for CAD and CHF. Last seen in clinic on 02/09/23. Noted to be doing well from cardiac perspective and cleared:  "According to the Revised Cardiac Risk Index (RCRI), his Perioperative Risk of Major Cardiac Event is (%): 0.9. His Functional Capacity in METs is: 6.45 according to the Duke Activity Status Index (DASI). Therefore, based on ACC/AHA guidelines, patient would be at acceptable risk for the planned procedure without further cardiovascular testing."  VS: BP 136/63 Comment: right arm sitting  Temp 36.9 C (Oral)   Resp 18   Ht 5' 4.5" (1.638 m)   Wt 78 kg   SpO2 97%   BMI 29.07 kg/m   PROVIDERS: Loyola Mast, MD Cardiology: Lance Muss, MD   LABS: Labs reviewed: Acceptable for surgery. (all labs ordered are listed, but only abnormal results are displayed)  Labs Reviewed  SURGICAL PCR SCREEN - Abnormal; Notable for the following components:      Result Value   Staphylococcus aureus POSITIVE (*)    All other components within normal limits  BASIC METABOLIC PANEL - Abnormal; Notable for the following  components:   Sodium 134 (*)    CO2 21 (*)    Glucose, Bld 117 (*)    All other components within normal limits  CBC     IMAGES:   EKG:   CV:  Echo 04/12/22:  IMPRESSIONS     1. Left ventricular ejection fraction, by estimation, is 40 to 45%. The  left ventricle has mildly decreased function. The left ventricle  demonstrates global hypokinesis. There is mild asymmetric left ventricular  hypertrophy. Left ventricular diastolic  parameters are consistent with Grade I diastolic dysfunction (impaired  relaxation).   2. Right ventricular systolic function is normal. The right ventricular  size is normal.   3. The mitral valve is normal in structure. Mild mitral valve  regurgitation. No evidence of mitral stenosis.   4. The aortic valve is tricuspid. Aortic valve regurgitation is not  visualized. Aortic valve sclerosis/calcification is present, without any  evidence of aortic stenosis.   5. The inferior vena cava is normal in size with greater than 50%  respiratory variability, suggesting right atrial pressure of 3 mmHg.   Comparison(s): The left ventricular function has improved slighlty in  11/2021 was 35-40% now 40-45%.   Holter monitor 12/25/21: Normal sinus rhythm with rare SVT and NSVT. Frequent PVCs with rare PACs noted. No symptoms associated with premature beats. No atrial fibrillation.  Cath and PCI 08/26/23: Prox LAD to Mid LAD lesion is 75% stenosed.   After shockwave lithotripsy,  a drug-eluting stent was successfully placed using a STENT ONYX FRONTIER [id number], postdilated to 4 mm.  The stent was optimized with intravascular ultrasound.   Post intervention, there is a 0% residual stenosis.   Moderate diffuse disease noted in the remainder of the LAD.   LV end diastolic pressure is normal.   There is no aortic valve stenosis.   Continue dual antiplatelet therapy for at least 6 months.  Continue aggressive secondary prevention and risk factor modification.  Plan  for same-day PCI.   Left radial was used due to known tortuosity in the right subclavian.  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)    HYPERLIPIDEMIA 07/13/2007   HYPERTENSION 07/13/2007   Impaired glucose tolerance 09/27/2016   Iron deficiency anemia due to chronic blood loss 02/27/2021   NEPHROLITHIASIS, 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   NSVT (nonsustained ventricular tachycardia) (HCC)    PEPTIC ULCER DISEASE 07/13/2007   PVC's (premature ventricular contractions)    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    Past Surgical History:  Procedure Laterality Date   BROW LIFT Bilateral 11/10/2020   Procedure: BLEPHAROPLASTY;  Surgeon: Allena Napoleon, MD;  Location: [facility];  Service: Plastics;  Laterality: Bilateral;  1 hour   COLONOSCOPY N/A 04/12/2022   Procedure: COLONOSCOPY;  Surgeon: Willis Modena, MD;  Location: [facility];  Service: Gastroenterology;  Laterality: N/A;   COLONOSCOPY WITH PROPOFOL N/A 02/17/2019   Procedure: COLONOSCOPY WITH PROPOFOL;  Surgeon: [name], MD;  [facility];  Service: Gastroenterology;  Laterality: N/A;   CORONARY STENT INTERVENTION N/A 08/25/2021   Procedure: CORONARY STENT INTERVENTION;  Surgeon: Corky Crafts, MD;  Location: [facility] INVASIVE CV LAB;  Service: Cardiovascular;  Laterality: N/A;   CORONARY ULTRASOUND/IVUS N/A 08/25/2021   Procedure: Intravascular Ultrasound/IVUS;  Surgeon: Corky Crafts, MD;  Location: [facility] INVASIVE CV LAB;  Service: Cardiovascular;   Laterality: N/A;   HYDROCELE EXCISION Right 05/15/2003   IR ANGIOGRAM VISCERAL SELECTIVE  02/16/2019   IR ANGIOGRAM VISCERAL SELECTIVE  02/16/2019   IR ANGIOGRAM VISCERAL SELECTIVE  02/16/2019   IR US GUIDE VASC ACCESS RIGHT  02/16/2019   LEFT HEART CATH AND CORONARY ANGIOGRAPHY N/A 07/28/2021   Procedure: LEFT HEART CATH AND CORONARY ANGIOGRAPHY;  Surgeon: Corky Crafts, MD;  Location: [facility] INVASIVE CV LAB;  Service: Cardiovascular;  Laterality: N/A;   LEFT HEART CATH AND CORONARY ANGIOGRAPHY N/A 08/25/2021   Procedure: LEFT HEART CATH AND CORONARY ANGIOGRAPHY;  Surgeon: Corky Crafts, MD;  Location: [facility] INVASIVE CV LAB;  Service: Cardiovascular;  Laterality: N/A;   PERIPHERAL INTRAVASCULAR LITHOTRIPSY  08/25/2021   Procedure: INTRAVASCULAR LITHOTRIPSY;  Surgeon: Corky Crafts, MD;  Location: [facility] INVASIVE CV LAB;  Service: Cardiovascular;;   ROTATOR CUFF REPAIR     SPERMATOCELECTOMY Right 05/15/2003    MEDICATIONS:  Ascorbic Acid (VITAMIN C) 1000 MG tablet   aspirin EC 81 MG tablet   Cyanocobalamin (B-12 PO)   hydrochlorothiazide (HYDRODIURIL) 25 MG tablet   HYDROcodone bit-homatropine (HYCODAN) 5-1.5 MG/5ML syrup   latanoprost (XALATAN) 0.005 %  ophthalmic solution   MAGNESIUM PO   naproxen (NAPROSYN) 500 MG tablet   nitroGLYCERIN (NITROSTAT) 0.4 MG SL tablet   pantoprazole (PROTONIX) 40 MG tablet   Polyvinyl Alcohol-Povidone (REFRESH OP)   psyllium (METAMUCIL) 58.6 % powder   rosuvastatin (CRESTOR) 20 MG tablet   tadalafil (CIALIS) 20 MG tablet   tamsulosin (FLOMAX) 0.4 MG CAPS capsule   valsartan (DIOVAN) 320 MG tablet   zinc gluconate 50 MG tablet   No current facility-administered medications for this encounter.   Marcille Blanco [facility] Short Stay/Anesthesiology [facility] Phone [phone] 07/05/2023 11:52 AM

## 2023-07-11 ENCOUNTER — Observation Stay (HOSPITAL_COMMUNITY)
Admission: RE | Admit: 2023-07-11 | Discharge: 2023-07-12 | Disposition: A | Discharge status: Home / Self Care | Payer: Medicare Other | Source: Home / Self Care | Attending: Orthopedic Surgery | Admitting: Orthopedic Surgery

## 2023-07-11 ENCOUNTER — Ambulatory Visit (HOSPITAL_BASED_OUTPATIENT_CLINIC_OR_DEPARTMENT_OTHER): Payer: Medicare Other | Admitting: Registered Nurse

## 2023-07-11 ENCOUNTER — Other Ambulatory Visit: Payer: Self-pay

## 2023-07-11 ENCOUNTER — Ambulatory Visit (HOSPITAL_COMMUNITY): Payer: Medicare Other | Admitting: Medical

## 2023-07-11 ENCOUNTER — Encounter (HOSPITAL_COMMUNITY): Payer: Self-pay | Admitting: Orthopedic Surgery

## 2023-07-11 ENCOUNTER — Encounter (HOSPITAL_COMMUNITY)
Admission: RE | Disposition: A | Discharge status: Home / Self Care | Payer: Self-pay | Source: Home / Self Care | Attending: Orthopedic Surgery

## 2023-07-11 DIAGNOSIS — K56609 Unspecified intestinal obstruction, unspecified as to partial versus complete obstruction: Secondary | ICD-10-CM | POA: Diagnosis not present

## 2023-07-11 DIAGNOSIS — R0902 Hypoxemia: Secondary | ICD-10-CM | POA: Diagnosis not present

## 2023-07-11 DIAGNOSIS — K5900 Constipation, unspecified: Secondary | ICD-10-CM | POA: Diagnosis not present

## 2023-07-11 DIAGNOSIS — A419 Sepsis, unspecified organism: Secondary | ICD-10-CM | POA: Diagnosis not present

## 2023-07-11 DIAGNOSIS — H903 Sensorineural hearing loss, bilateral: Secondary | ICD-10-CM | POA: Diagnosis not present

## 2023-07-11 DIAGNOSIS — G8918 Other acute postprocedural pain: Secondary | ICD-10-CM | POA: Diagnosis not present

## 2023-07-11 DIAGNOSIS — A09 Infectious gastroenteritis and colitis, unspecified: Secondary | ICD-10-CM | POA: Diagnosis not present

## 2023-07-11 DIAGNOSIS — I5022 Chronic systolic (congestive) heart failure: Secondary | ICD-10-CM | POA: Diagnosis not present

## 2023-07-11 DIAGNOSIS — K5909 Other constipation: Secondary | ICD-10-CM | POA: Diagnosis not present

## 2023-07-11 DIAGNOSIS — I10 Essential (primary) hypertension: Secondary | ICD-10-CM | POA: Diagnosis not present

## 2023-07-11 DIAGNOSIS — Z955 Presence of coronary angioplasty implant and graft: Secondary | ICD-10-CM | POA: Insufficient documentation

## 2023-07-11 DIAGNOSIS — Z79899 Other long term (current) drug therapy: Secondary | ICD-10-CM | POA: Insufficient documentation

## 2023-07-11 DIAGNOSIS — K5730 Diverticulosis of large intestine without perforation or abscess without bleeding: Secondary | ICD-10-CM | POA: Diagnosis not present

## 2023-07-11 DIAGNOSIS — I5020 Unspecified systolic (congestive) heart failure: Secondary | ICD-10-CM | POA: Insufficient documentation

## 2023-07-11 DIAGNOSIS — E860 Dehydration: Secondary | ICD-10-CM | POA: Diagnosis not present

## 2023-07-11 DIAGNOSIS — E872 Acidosis, unspecified: Secondary | ICD-10-CM | POA: Diagnosis not present

## 2023-07-11 DIAGNOSIS — M1712 Unilateral primary osteoarthritis, left knee: Secondary | ICD-10-CM

## 2023-07-11 DIAGNOSIS — E871 Hypo-osmolality and hyponatremia: Secondary | ICD-10-CM | POA: Diagnosis not present

## 2023-07-11 DIAGNOSIS — R6520 Severe sepsis without septic shock: Secondary | ICD-10-CM | POA: Diagnosis not present

## 2023-07-11 DIAGNOSIS — Z7982 Long term (current) use of aspirin: Secondary | ICD-10-CM | POA: Insufficient documentation

## 2023-07-11 DIAGNOSIS — Z8616 Personal history of COVID-19: Secondary | ICD-10-CM | POA: Diagnosis not present

## 2023-07-11 DIAGNOSIS — Z1152 Encounter for screening for COVID-19: Secondary | ICD-10-CM | POA: Diagnosis not present

## 2023-07-11 DIAGNOSIS — I2510 Atherosclerotic heart disease of native coronary artery without angina pectoris: Secondary | ICD-10-CM | POA: Insufficient documentation

## 2023-07-11 DIAGNOSIS — D649 Anemia, unspecified: Secondary | ICD-10-CM | POA: Diagnosis not present

## 2023-07-11 DIAGNOSIS — K219 Gastro-esophageal reflux disease without esophagitis: Secondary | ICD-10-CM | POA: Diagnosis not present

## 2023-07-11 DIAGNOSIS — I110 Hypertensive heart disease with heart failure: Secondary | ICD-10-CM | POA: Insufficient documentation

## 2023-07-11 DIAGNOSIS — K529 Noninfective gastroenteritis and colitis, unspecified: Secondary | ICD-10-CM | POA: Diagnosis not present

## 2023-07-11 DIAGNOSIS — K9189 Other postprocedural complications and disorders of digestive system: Secondary | ICD-10-CM | POA: Diagnosis not present

## 2023-07-11 DIAGNOSIS — R6889 Other general symptoms and signs: Secondary | ICD-10-CM | POA: Diagnosis not present

## 2023-07-11 DIAGNOSIS — E785 Hyperlipidemia, unspecified: Secondary | ICD-10-CM | POA: Diagnosis not present

## 2023-07-11 DIAGNOSIS — R6510 Systemic inflammatory response syndrome (SIRS) of non-infectious origin without acute organ dysfunction: Secondary | ICD-10-CM | POA: Diagnosis not present

## 2023-07-11 DIAGNOSIS — E876 Hypokalemia: Secondary | ICD-10-CM | POA: Diagnosis not present

## 2023-07-11 DIAGNOSIS — G4733 Obstructive sleep apnea (adult) (pediatric): Secondary | ICD-10-CM | POA: Diagnosis not present

## 2023-07-11 DIAGNOSIS — K560 Paralytic ileus: Secondary | ICD-10-CM | POA: Diagnosis not present

## 2023-07-11 DIAGNOSIS — R112 Nausea with vomiting, unspecified: Secondary | ICD-10-CM | POA: Diagnosis not present

## 2023-07-11 DIAGNOSIS — M179 Osteoarthritis of knee, unspecified: Principal | ICD-10-CM | POA: Diagnosis present

## 2023-07-11 DIAGNOSIS — Z743 Need for continuous supervision: Secondary | ICD-10-CM | POA: Diagnosis not present

## 2023-07-11 HISTORY — PX: TOTAL KNEE ARTHROPLASTY: SHX125

## 2023-07-11 SURGERY — ARTHROPLASTY, KNEE, TOTAL
Anesthesia: Spinal | Site: Knee | Laterality: Left

## 2023-07-11 MED ORDER — STERILE WATER FOR IRRIGATION IR SOLN
Status: DC | PRN
  Administered 2023-07-11: 2000 mL

## 2023-07-11 MED ORDER — ASPIRIN 81 MG PO CHEW
81.0000 mg | CHEWABLE_TABLET | Freq: Two times a day (BID) | ORAL | Status: DC
  Administered 2023-07-12: 81 mg via ORAL
  Filled 2023-07-11: qty 1

## 2023-07-11 MED ORDER — DEXAMETHASONE SODIUM PHOSPHATE 10 MG/ML IJ SOLN
10.0000 mg | Freq: Once | INTRAMUSCULAR | Status: AC
  Administered 2023-07-12: 10 mg via INTRAVENOUS
  Filled 2023-07-11: qty 1

## 2023-07-11 MED ORDER — SODIUM CHLORIDE 0.9 % IV SOLN: INTRAVENOUS | Status: DC

## 2023-07-11 MED ORDER — HYDROCHLOROTHIAZIDE 25 MG PO TABS
25.0000 mg | ORAL_TABLET | Freq: Every day | ORAL | Status: DC
  Administered 2023-07-12: 25 mg via ORAL
  Filled 2023-07-11: qty 1

## 2023-07-11 MED ORDER — FENTANYL CITRATE PF 50 MCG/ML IJ SOSY
50.0000 ug | PREFILLED_SYRINGE | INTRAMUSCULAR | Status: DC
  Administered 2023-07-11: 50 ug via INTRAVENOUS
  Filled 2023-07-11: qty 2

## 2023-07-11 MED ORDER — OXYCODONE HCL 5 MG PO TABS: 5.0000 mg | ORAL_TABLET | Freq: Once | ORAL | Status: DC | PRN

## 2023-07-11 MED ORDER — TAMSULOSIN HCL 0.4 MG PO CAPS
0.4000 mg | ORAL_CAPSULE | Freq: Every day | ORAL | Status: DC
  Administered 2023-07-12: 0.4 mg via ORAL
  Filled 2023-07-11: qty 1

## 2023-07-11 MED ORDER — SODIUM CHLORIDE (PF) 0.9 % IJ SOLN
INTRAMUSCULAR | Status: AC
  Filled 2023-07-11: qty 10

## 2023-07-11 MED ORDER — PHENOL 1.4 % MT LIQD: 1.0000 | OROMUCOSAL | Status: DC | PRN

## 2023-07-11 MED ORDER — CHLORHEXIDINE GLUCONATE 0.12 % MT SOLN
15.0000 mL | Freq: Once | OROMUCOSAL | Status: AC
  Administered 2023-07-11: 15 mL via OROMUCOSAL

## 2023-07-11 MED ORDER — ACETAMINOPHEN 10 MG/ML IV SOLN
1000.0000 mg | Freq: Four times a day (QID) | INTRAVENOUS | Status: DC
  Administered 2023-07-11: 1000 mg via INTRAVENOUS
  Filled 2023-07-11: qty 100

## 2023-07-11 MED ORDER — ONDANSETRON HCL 4 MG/2ML IJ SOLN
INTRAMUSCULAR | Status: AC
  Filled 2023-07-11: qty 2

## 2023-07-11 MED ORDER — MENTHOL 3 MG MT LOZG: 1.0000 | LOZENGE | OROMUCOSAL | Status: DC | PRN

## 2023-07-11 MED ORDER — BISACODYL 10 MG RE SUPP: 10.0000 mg | Freq: Every day | RECTAL | Status: DC | PRN

## 2023-07-11 MED ORDER — TRAMADOL HCL 50 MG PO TABS: 50.0000 mg | ORAL_TABLET | Freq: Four times a day (QID) | ORAL | Status: DC | PRN

## 2023-07-11 MED ORDER — BUPIVACAINE LIPOSOME 1.3 % IJ SUSP
INTRAMUSCULAR | Status: DC | PRN
  Administered 2023-07-11: 20 mL

## 2023-07-11 MED ORDER — DOCUSATE SODIUM 100 MG PO CAPS
100.0000 mg | ORAL_CAPSULE | Freq: Two times a day (BID) | ORAL | Status: DC
  Administered 2023-07-11 – 2023-07-12 (×2): 100 mg via ORAL
  Filled 2023-07-11 (×2): qty 1

## 2023-07-11 MED ORDER — ACETAMINOPHEN 500 MG PO TABS
1000.0000 mg | ORAL_TABLET | Freq: Four times a day (QID) | ORAL | Status: AC
  Administered 2023-07-11 – 2023-07-12 (×4): 1000 mg via ORAL
  Filled 2023-07-11 (×4): qty 2

## 2023-07-11 MED ORDER — METHOCARBAMOL 500 MG PO TABS
500.0000 mg | ORAL_TABLET | Freq: Four times a day (QID) | ORAL | Status: DC | PRN
  Administered 2023-07-11 – 2023-07-12 (×3): 500 mg via ORAL
  Filled 2023-07-11 (×3): qty 1

## 2023-07-11 MED ORDER — POLYETHYLENE GLYCOL 3350 17 G PO PACK: 17.0000 g | PACK | Freq: Every day | ORAL | Status: DC | PRN

## 2023-07-11 MED ORDER — ONDANSETRON HCL 4 MG/2ML IJ SOLN: 4.0000 mg | Freq: Four times a day (QID) | INTRAMUSCULAR | Status: DC | PRN

## 2023-07-11 MED ORDER — NITROGLYCERIN 0.4 MG SL SUBL: 0.4000 mg | SUBLINGUAL_TABLET | SUBLINGUAL | Status: DC | PRN

## 2023-07-11 MED ORDER — BUPIVACAINE IN DEXTROSE 0.75-8.25 % IT SOLN
INTRATHECAL | Status: DC | PRN
  Administered 2023-07-11: 1.8 mL via INTRATHECAL

## 2023-07-11 MED ORDER — OXYCODONE HCL 5 MG PO TABS
5.0000 mg | ORAL_TABLET | ORAL | Status: DC | PRN
  Administered 2023-07-11: 5 mg via ORAL
  Administered 2023-07-12 (×3): 10 mg via ORAL
  Filled 2023-07-11 (×3): qty 2
  Filled 2023-07-11: qty 1

## 2023-07-11 MED ORDER — DEXAMETHASONE SODIUM PHOSPHATE 10 MG/ML IJ SOLN
8.0000 mg | Freq: Once | INTRAMUSCULAR | Status: AC
  Administered 2023-07-11: 8 mg via INTRAVENOUS

## 2023-07-11 MED ORDER — ORAL CARE MOUTH RINSE: 15.0000 mL | Freq: Once | OROMUCOSAL | Status: AC

## 2023-07-11 MED ORDER — ACETAMINOPHEN 10 MG/ML IV SOLN: 1000.0000 mg | Freq: Once | INTRAVENOUS | Status: DC | PRN

## 2023-07-11 MED ORDER — CEFAZOLIN SODIUM-DEXTROSE 2-4 GM/100ML-% IV SOLN
2.0000 g | Freq: Four times a day (QID) | INTRAVENOUS | Status: AC
  Administered 2023-07-11 (×2): 2 g via INTRAVENOUS
  Filled 2023-07-11 (×2): qty 100

## 2023-07-11 MED ORDER — LATANOPROST 0.005 % OP SOLN
1.0000 [drp] | Freq: Every day | OPHTHALMIC | Status: DC
  Administered 2023-07-11: 1 [drp] via OPHTHALMIC
  Filled 2023-07-11: qty 2.5

## 2023-07-11 MED ORDER — ONDANSETRON HCL 4 MG/2ML IJ SOLN: 4.0000 mg | Freq: Once | INTRAMUSCULAR | Status: DC | PRN

## 2023-07-11 MED ORDER — POVIDONE-IODINE 10 % EX SWAB: 2.0000 | Freq: Once | CUTANEOUS | Status: DC

## 2023-07-11 MED ORDER — PANTOPRAZOLE SODIUM 40 MG PO TBEC
40.0000 mg | DELAYED_RELEASE_TABLET | Freq: Every day | ORAL | Status: DC
  Administered 2023-07-12: 40 mg via ORAL
  Filled 2023-07-11: qty 1

## 2023-07-11 MED ORDER — ROSUVASTATIN CALCIUM 20 MG PO TABS
20.0000 mg | ORAL_TABLET | Freq: Every day | ORAL | Status: DC
  Administered 2023-07-12: 20 mg via ORAL
  Filled 2023-07-11: qty 1

## 2023-07-11 MED ORDER — HYDROMORPHONE HCL 1 MG/ML IJ SOLN
0.5000 mg | INTRAMUSCULAR | Status: DC | PRN
  Administered 2023-07-11 (×2): 0.5 mg via INTRAVENOUS
  Filled 2023-07-11: qty 1

## 2023-07-11 MED ORDER — LACTATED RINGERS IV SOLN: INTRAVENOUS | Status: DC

## 2023-07-11 MED ORDER — PROPOFOL 500 MG/50ML IV EMUL
INTRAVENOUS | Status: DC | PRN
  Administered 2023-07-11: 40 ug/kg/min via INTRAVENOUS

## 2023-07-11 MED ORDER — ONDANSETRON HCL 4 MG/2ML IJ SOLN
INTRAMUSCULAR | Status: DC | PRN
  Administered 2023-07-11: 4 mg via INTRAVENOUS

## 2023-07-11 MED ORDER — FENTANYL CITRATE PF 50 MCG/ML IJ SOSY: 25.0000 ug | PREFILLED_SYRINGE | INTRAMUSCULAR | Status: DC | PRN

## 2023-07-11 MED ORDER — OXYCODONE HCL 5 MG/5ML PO SOLN: 5.0000 mg | Freq: Once | ORAL | Status: DC | PRN

## 2023-07-11 MED ORDER — DIPHENHYDRAMINE HCL 12.5 MG/5ML PO ELIX
12.5000 mg | ORAL_SOLUTION | ORAL | Status: DC | PRN
  Administered 2023-07-11: 25 mg via ORAL
  Filled 2023-07-11: qty 10

## 2023-07-11 MED ORDER — DEXAMETHASONE SODIUM PHOSPHATE 10 MG/ML IJ SOLN
INTRAMUSCULAR | Status: AC
  Filled 2023-07-11: qty 1

## 2023-07-11 MED ORDER — BUPIVACAINE LIPOSOME 1.3 % IJ SUSP
INTRAMUSCULAR | Status: AC
  Filled 2023-07-11: qty 20

## 2023-07-11 MED ORDER — METHOCARBAMOL 500 MG IVPB - SIMPLE MED: 500.0000 mg | Freq: Four times a day (QID) | INTRAVENOUS | Status: DC | PRN

## 2023-07-11 MED ORDER — 0.9 % SODIUM CHLORIDE (POUR BTL) OPTIME
TOPICAL | Status: DC | PRN
  Administered 2023-07-11: 1000 mL

## 2023-07-11 MED ORDER — METOCLOPRAMIDE HCL 5 MG/ML IJ SOLN: 5.0000 mg | Freq: Three times a day (TID) | INTRAMUSCULAR | Status: DC | PRN

## 2023-07-11 MED ORDER — TRANEXAMIC ACID-NACL 1000-0.7 MG/100ML-% IV SOLN
1000.0000 mg | INTRAVENOUS | Status: AC
  Administered 2023-07-11: 1000 mg via INTRAVENOUS
  Filled 2023-07-11: qty 100

## 2023-07-11 MED ORDER — SODIUM CHLORIDE (PF) 0.9 % IJ SOLN
INTRAMUSCULAR | Status: AC
  Filled 2023-07-11: qty 50

## 2023-07-11 MED ORDER — PROPOFOL 1000 MG/100ML IV EMUL
INTRAVENOUS | Status: AC
  Filled 2023-07-11: qty 100

## 2023-07-11 MED ORDER — ONDANSETRON HCL 4 MG PO TABS: 4.0000 mg | ORAL_TABLET | Freq: Four times a day (QID) | ORAL | Status: DC | PRN

## 2023-07-11 MED ORDER — BUPIVACAINE LIPOSOME 1.3 % IJ SUSP: 20.0000 mL | Freq: Once | INTRAMUSCULAR | Status: DC

## 2023-07-11 MED ORDER — FLEET ENEMA 7-19 GM/118ML RE ENEM: 1.0000 | ENEMA | Freq: Once | RECTAL | Status: DC | PRN

## 2023-07-11 MED ORDER — SODIUM CHLORIDE 0.9 % IR SOLN
Status: DC | PRN
  Administered 2023-07-11: 3000 mL

## 2023-07-11 MED ORDER — IRBESARTAN 150 MG PO TABS
300.0000 mg | ORAL_TABLET | Freq: Every day | ORAL | Status: DC
  Administered 2023-07-12: 300 mg via ORAL
  Filled 2023-07-11: qty 2

## 2023-07-11 MED ORDER — SODIUM CHLORIDE (PF) 0.9 % IJ SOLN
INTRAMUSCULAR | Status: DC | PRN
  Administered 2023-07-11: 60 mL

## 2023-07-11 MED ORDER — CEFAZOLIN SODIUM-DEXTROSE 2-4 GM/100ML-% IV SOLN
2.0000 g | INTRAVENOUS | Status: AC
  Administered 2023-07-11: 2 g via INTRAVENOUS
  Filled 2023-07-11: qty 100

## 2023-07-11 MED ORDER — METOCLOPRAMIDE HCL 5 MG PO TABS: 5.0000 mg | ORAL_TABLET | Freq: Three times a day (TID) | ORAL | Status: DC | PRN

## 2023-07-11 SURGICAL SUPPLY — 57 items
ADH SKN CLS APL DERMABOND .7 (GAUZE/BANDAGES/DRESSINGS) ×1
ATTUNE MED DOME PAT 38 KNEE (Knees) IMPLANT
ATTUNE PS FEM LT SZ 6 CEM KNEE (Femur) IMPLANT
ATTUNE PSRP INSR SZ6 8 KNEE (Insert) IMPLANT
BAG COUNTER SPONGE SURGICOUNT (BAG) IMPLANT
BAG SPEC THK2 15X12 ZIP CLS (MISCELLANEOUS) ×1
BAG SPNG CNTER NS LX DISP (BAG)
BAG ZIPLOCK 12X15 (MISCELLANEOUS) ×1 IMPLANT
BASE TIBIA ATTUNE KNEE SYS SZ6 (Knees) IMPLANT
BLADE SAG 18X100X1.27 (BLADE) ×2 IMPLANT
BLADE SAW SGTL 11.0X1.19X90.0M (BLADE) ×1 IMPLANT
BNDG CMPR 6 X 5 YARDS HK CLSR (GAUZE/BANDAGES/DRESSINGS) ×1
BNDG CMPR MED 10X6 ELC LF (GAUZE/BANDAGES/DRESSINGS) ×1
BNDG ELASTIC 6INX 5YD STR LF (GAUZE/BANDAGES/DRESSINGS) ×2 IMPLANT
BNDG ELASTIC 6X10 VLCR STRL LF (GAUZE/BANDAGES/DRESSINGS) IMPLANT
BOWL SMART MIX CTS (DISPOSABLE) ×2 IMPLANT
BSPLAT TIB 6 CMNT ROT PLAT STR (Knees) ×1 IMPLANT
CEMENT HV SMART SET (Cement) ×2 IMPLANT
COVER SURGICAL LIGHT HANDLE (MISCELLANEOUS) ×2 IMPLANT
CUFF TOURN SGL QUICK 34 (TOURNIQUET CUFF) ×1
CUFF TRNQT CYL 34X4.125X (TOURNIQUET CUFF) ×2 IMPLANT
DERMABOND ADVANCED .7 DNX12 (GAUZE/BANDAGES/DRESSINGS) ×1 IMPLANT
DRAPE INCISE IOBAN 66X45 STRL (DRAPES) ×1 IMPLANT
DRAPE U-SHAPE 47X51 STRL (DRAPES) ×2 IMPLANT
DRSG AQUACEL AG ADV 3.5X10 (GAUZE/BANDAGES/DRESSINGS) ×1 IMPLANT
DURAPREP 26ML APPLICATOR (WOUND CARE) ×1 IMPLANT
ELECT REM PT RETURN 15FT ADLT (MISCELLANEOUS) ×1 IMPLANT
GLOVE BIO SURGEON STRL SZ 6.5 (GLOVE) IMPLANT
GLOVE BIO SURGEON STRL SZ8 (GLOVE) ×1 IMPLANT
GLOVE BIOGEL PI IND STRL 6.5 (GLOVE) IMPLANT
GLOVE BIOGEL PI IND STRL 7.0 (GLOVE) IMPLANT
GLOVE BIOGEL PI IND STRL 8 (GLOVE) ×2 IMPLANT
GOWN STRL REUS W/ TWL LRG LVL3 (GOWN DISPOSABLE) ×1 IMPLANT
GOWN STRL REUS W/TWL LRG LVL3 (GOWN DISPOSABLE) ×1
HANDPIECE INTERPULSE COAX TIP (DISPOSABLE) ×1
HOLDER FOLEY CATH W/STRAP (MISCELLANEOUS) IMPLANT
IMMOBILIZER KNEE 20 (SOFTGOODS) ×1
IMMOBILIZER KNEE 20 THIGH 36 (SOFTGOODS) ×2 IMPLANT
KIT TURNOVER KIT A (KITS) IMPLANT
MANIFOLD NEPTUNE II (INSTRUMENTS) ×2 IMPLANT
NS IRRIG 1000ML POUR BTL (IV SOLUTION) ×1 IMPLANT
PACK TOTAL KNEE CUSTOM (KITS) ×1 IMPLANT
PADDING CAST COTTON 6X4 STRL (CAST SUPPLIES) ×4 IMPLANT
PIN STEINMAN FIXATION KNEE (PIN) IMPLANT
PROTECTOR NERVE ULNAR (MISCELLANEOUS) ×1 IMPLANT
SET HNDPC FAN SPRY TIP SCT (DISPOSABLE) ×1 IMPLANT
SPIKE FLUID TRANSFER (MISCELLANEOUS) ×1 IMPLANT
SUT MNCRL AB 4-0 PS2 18 (SUTURE) ×1 IMPLANT
SUT STRATAFIX 0 PDS 27 VIOLET (SUTURE) ×1
SUT VIC AB 2-0 CT1 27 (SUTURE) ×3
SUT VIC AB 2-0 CT1 TAPERPNT 27 (SUTURE) ×3 IMPLANT
SUTURE STRATFX 0 PDS 27 VIOLET (SUTURE) ×2 IMPLANT
TIBIA ATTUNE KNEE SYS BASE SZ6 (Knees) ×1 IMPLANT
TRAY FOLEY MTR SLVR 16FR STAT (SET/KITS/TRAYS/PACK) ×1 IMPLANT
TUBE SUCTION HIGH CAP CLEAR NV (SUCTIONS) ×2 IMPLANT
WATER STERILE IRR 1000ML POUR (IV SOLUTION) ×2 IMPLANT
WRAP KNEE MAXI GEL POST OP (GAUZE/BANDAGES/DRESSINGS) ×1 IMPLANT

## 2023-07-11 NOTE — Discharge Instructions (Signed)
 Frank Aluisio, MD Total Joint Specialist [facility] [address] [place], [place] [address] [phone]  TOTAL KNEE REPLACEMENT POSTOPERATIVE DIRECTIONS    Knee Rehabilitation, Guidelines Following Surgery  Results after knee surgery are often greatly improved when you follow the exercise, range of motion and muscle strengthening exercises prescribed by your doctor. Safety measures are also important to protect the knee from further injury. If any of these exercises cause you to have increased pain or swelling in your knee joint, decrease the amount until you are comfortable again and slowly increase them. If you have problems or questions, call your caregiver or physical therapist for advice.   BLOOD CLOT PREVENTION Take an 81 mg Aspirin two times a day for three weeks following surgery. Then resume one 81 mg Aspirin once a day. You may resume your vitamins/supplements upon discharge from the hospital. Do not take any NSAIDs (Advil, Aleve, Ibuprofen, Meloxicam, etc.) until you are 3 weeks out from surgery  HOME CARE INSTRUCTIONS  Remove items at home which could result in a fall. This includes throw rugs or furniture in walking pathways.  ICE to the affected knee as much as tolerated. Icing helps control swelling. If the swelling is well controlled you will be more comfortable and rehab easier. Continue to use ice on the knee for pain and swelling from surgery. You may notice swelling that will progress down to the foot and ankle. This is normal after surgery. Elevate the leg when you are not up walking on it.    Continue to use the breathing machine which will help keep your temperature down. It is common for your temperature to cycle up and down following surgery, especially at night when you are not up moving around and exerting yourself. The breathing machine keeps your lungs expanded and your temperature down. Do not place pillow under the operative  knee, focus on keeping the knee straight while resting  DIET You may resume your previous home diet once you are discharged from the hospital.  DRESSING / WOUND CARE / SHOWERING Keep your bulky bandage on for 2 days. On the third post-operative day you may remove the Ace bandage and gauze. There is a waterproof adhesive bandage on your skin which will stay in place until your first follow-up appointment. Once you remove this you will not need to place another bandage You may begin showering 3 days following surgery, but do not submerge the incision under water.  ACTIVITY For the first 5 days, the key is rest and control of pain and swelling Do your home exercises twice a day starting on post-operative day 3. On the days you go to physical therapy, just do the home exercises once that day. You should rest, ice and elevate the leg for 50 minutes out of every hour. Get up and walk/stretch for 10 minutes per hour. After 5 days you can increase your activity slowly as tolerated. Walk with your walker as instructed. Use the walker until you are comfortable transitioning to a cane. Walk with the cane in the opposite hand of the operative leg. You may discontinue the cane once you are comfortable and walking steadily. Avoid periods of inactivity such as sitting longer than an hour when not asleep. This helps prevent blood clots.  You may discontinue the knee immobilizer once you are able to perform a straight leg raise while lying down. You may resume a sexual relationship in one month or when given the OK by   your doctor.  You may return to work once you are cleared by your doctor.  Do not drive a car for 6 weeks or until released by your surgeon.  Do not drive while taking narcotics.  TED HOSE STOCKINGS Wear the elastic stockings on both legs for three weeks following surgery during the day. You may remove them at night for sleeping.  WEIGHT BEARING Weight bearing as tolerated with assist device  (walker, cane, etc) as directed, use it as long as suggested by your surgeon or therapist, typically at least 4-6 weeks.  POSTOPERATIVE CONSTIPATION PROTOCOL Constipation - defined medically as fewer than three stools per week and severe constipation as less than one stool per week.  One of the most common issues patients have following surgery is constipation.  Even if you have a regular bowel pattern at home, your normal regimen is likely to be disrupted due to multiple reasons following surgery.  Combination of anesthesia, postoperative narcotics, change in appetite and fluid intake all can affect your bowels.  In order to avoid complications following surgery, here are some recommendations in order to help you during your recovery period.  Colace (docusate) - Pick up an over-the-counter form of Colace or another stool softener and take twice a day as long as you are requiring postoperative pain medications.  Take with a full glass of water daily.  If you experience loose stools or diarrhea, hold the colace until you stool forms back up. If your symptoms do not get better within 1 week or if they get worse, check with your doctor. Dulcolax (bisacodyl) - Pick up over-the-counter and take as directed by the product packaging as needed to assist with the movement of your bowels.  Take with a full glass of water.  Use this product as needed if not relieved by Colace only.  MiraLax (polyethylene glycol) - Pick up over-the-counter to have on hand. MiraLax is a solution that will increase the amount of water in your bowels to assist with bowel movements.  Take as directed and can mix with a glass of water, juice, soda, coffee, or tea. Take if you go more than two days without a movement. Do not use MiraLax more than once per day. Call your doctor if you are still constipated or irregular after using this medication for 7 days in a row.  If you continue to have problems with postoperative constipation, please  contact the office for further assistance and recommendations.  If you experience "the worst abdominal pain ever" or develop nausea or vomiting, please contact the office immediatly for further recommendations for treatment.  ITCHING If you experience itching with your medications, try taking only a single pain pill, or even half a pain pill at a time.  You can also use Benadryl over the counter for itching or also to help with sleep.   MEDICATIONS See your medication summary on the "After Visit Summary" that the nursing staff will review with you prior to discharge.  You may have some home medications which will be placed on hold until you complete the course of blood thinner medication.  It is important for you to complete the blood thinner medication as prescribed by your surgeon.  Continue your approved medications as instructed at time of discharge.  PRECAUTIONS If you experience chest pain or shortness of breath - call 911 immediately for transfer to the hospital emergency department.  If you develop a fever greater that 101 F, purulent drainage from wound, increased redness   or drainage from wound, foul odor from the wound/dressing, or calf pain - CONTACT YOUR SURGEON.                                                   FOLLOW-UP APPOINTMENTS Make sure you keep all of your appointments after your operation with your surgeon and caregivers. You should call the office at the above phone number and make an appointment for approximately two weeks after the date of your surgery or on the date instructed by your surgeon outlined in the "After Visit Summary".  RANGE OF MOTION AND STRENGTHENING EXERCISES  Rehabilitation of the knee is important following a knee injury or an operation. After just a few days of immobilization, the muscles of the thigh which control the knee become weakened and shrink (atrophy). Knee exercises are designed to build up the tone and strength of the thigh muscles and to  improve knee motion. Often times heat used for twenty to thirty minutes before working out will loosen up your tissues and help with improving the range of motion but do not use heat for the first two weeks following surgery. These exercises can be done on a training (exercise) mat, on the floor, on a table or on a bed. Use what ever works the best and is most comfortable for you Knee exercises include:  Leg Lifts - While your knee is still immobilized in a splint or cast, you can do straight leg raises. Lift the leg to 60 degrees, hold for 3 sec, and slowly lower the leg. Repeat 10-20 times 2-3 times daily. Perform this exercise against resistance later as your knee gets better.  Quad and Hamstring Sets - Tighten up the muscle on the front of the thigh (Quad) and hold for 5-10 sec. Repeat this 10-20 times hourly. Hamstring sets are done by pushing the foot backward against an object and holding for 5-10 sec. Repeat as with quad sets.  Leg Slides: Lying on your back, slowly slide your foot toward your buttocks, bending your knee up off the floor (only go as far as is comfortable). Then slowly slide your foot back down until your leg is flat on the floor again. Angel Wings: Lying on your back spread your legs to the side as far apart as you can without causing discomfort.  A rehabilitation program following serious knee injuries can speed recovery and prevent re-injury in the future due to weakened muscles. Contact your doctor or a physical therapist for more information on knee rehabilitation.   POST-OPERATIVE OPIOID TAPER INSTRUCTIONS: It is important to wean off of your opioid medication as soon as possible. If you do not need pain medication after your surgery it is ok to stop day one. Opioids include: Codeine, Hydrocodone(Norco, Vicodin), Oxycodone(Percocet, oxycontin) and hydromorphone amongst others.  Long term and even short term use of opiods can cause: Increased pain  response Dependence Constipation Depression Respiratory depression And more.  Withdrawal symptoms can include Flu like symptoms Nausea, vomiting And more Techniques to manage these symptoms Hydrate well Eat regular healthy meals Stay active Use relaxation techniques(deep breathing, meditating, yoga) Do Not substitute Alcohol to help with tapering If you have been on opioids for less than two weeks and do not have pain than it is ok to stop all together.  Plan to wean off of opioids This   plan should start within one week post op of your joint replacement. Maintain the same interval or time between taking each dose and first decrease the dose.  Cut the total daily intake of opioids by one tablet each day Next start to increase the time between doses. The last dose that should be eliminated is the evening dose.   IF YOU ARE TRANSFERRED TO A SKILLED REHAB FACILITY If the patient is transferred to a skilled rehab facility following release from the hospital, a list of the current medications will be sent to the facility for the patient to continue.  When discharged from the skilled rehab facility, please have the facility set up the patient's Home Health Physical Therapy prior to being released. Also, the skilled facility will be responsible for providing the patient with their medications at time of release from the facility to include their pain medication, the muscle relaxants, and their blood thinner medication. If the patient is still at the rehab facility at time of the two week follow up appointment, the skilled rehab facility will also need to assist the patient in arranging follow up appointment in our office and any transportation needs.  MAKE SURE YOU:  Understand these instructions.  Get help right away if you are not doing well or get worse.   DENTAL ANTIBIOTICS:  In most cases prophylactic antibiotics for Dental procdeures after total joint surgery are not  necessary.  Exceptions are as follows:  1. History of prior total joint infection  2. Severely immunocompromised (Organ Transplant, cancer chemotherapy, Rheumatoid biologic meds such as Humera)  3. Poorly controlled diabetes (A1C &gt; 8.0, blood glucose over 200)  If you have one of these conditions, contact your surgeon for an antibiotic prescription, prior to your dental procedure.    Pick up stool softner and laxative for home use following surgery while on pain medications. Do not submerge incision under water. Please use good hand washing techniques while changing dressing each day. May shower starting three days after surgery. Please use a clean towel to pat the incision dry following showers. Continue to use ice for pain and swelling after surgery. Do not use any lotions or creams on the incision until instructed by your surgeon.  

## 2023-07-11 NOTE — Anesthesia Procedure Notes (Addendum)
Anesthesia Regional Block: Adductor canal block   Pre-Anesthetic Checklist: , timeout performed,  Correct Patient, Correct Site, Correct Laterality,  Correct Procedure, Correct Position, site marked,  Risks and benefits discussed,  Surgical consent,  Pre-op evaluation,  At surgeon's request and post-op pain management  Laterality: Left  Prep: Maximum Sterile Barrier Precautions used, chloraprep       Needles:  Injection technique: Single-shot  Needle Type: Echogenic Needle      Needle Gauge: 20     Additional Needles:   Procedures:,,,, ultrasound used (permanent image in chart),,    Narrative:  Start time: 07/11/2023 7:55 AM End time: 07/11/2023 7:58 AM Injection made incrementally with aspirations every 5 mL.  Performed by: Personally  Anesthesiologist: Mariann Barter, MD

## 2023-07-11 NOTE — Plan of Care (Signed)
  Problem: Education: Goal: Knowledge of the prescribed therapeutic regimen will improve Outcome: Progressing   Problem: Activity: Goal: Ability to avoid complications of mobility impairment will improve Outcome: Progressing Goal: Range of joint motion will improve Outcome: Progressing   Problem: Clinical Measurements: Goal: Postoperative complications will be avoided or minimized Outcome: Progressing   Problem: Pain Management: Goal: Pain level will decrease with appropriate interventions Outcome: Progressing   Problem: Skin Integrity: Goal: Will show signs of wound healing Outcome: Progressing   

## 2023-07-11 NOTE — Transfer of Care (Signed)
Immediate Anesthesia Transfer of Care Note  Patient: David Goodman  Procedure(s) Performed: TOTAL KNEE ARTHROPLASTY (Left: Knee)  Patient Location: PACU  Anesthesia Type:MAC and Spinal  Level of Consciousness: awake, alert , oriented, and patient cooperative  Airway & Oxygen Therapy: Patient Spontanous Breathing and Patient connected to face mask oxygen  Post-op Assessment: Report given to RN and Post -op Vital signs reviewed and stable  Post vital signs: Reviewed and stable  Last Vitals:  Vitals Value Taken Time  BP 125/66 07/11/23 0953  Temp 36.4 C 07/11/23 0953  Pulse 71 07/11/23 0956  Resp 20 07/11/23 0956  SpO2 100 % 07/11/23 0956  Vitals shown include unfiled device data.  Last Pain:  Vitals:   07/11/23 0644  TempSrc: Oral  PainSc:       Patients Stated Pain Goal: 6 (07/11/23 0454)  Complications: No notable events documented.

## 2023-07-11 NOTE — Anesthesia Postprocedure Evaluation (Signed)
Anesthesia Post Note  Patient: Stoney Reza  Procedure(s) Performed: TOTAL KNEE ARTHROPLASTY (Left: Knee)     Patient location during evaluation: PACU Anesthesia Type: Spinal Level of consciousness: oriented and awake and alert Pain management: pain level controlled Vital Signs Assessment: post-procedure vital signs reviewed and stable Respiratory status: spontaneous breathing, respiratory function stable and patient connected to nasal cannula oxygen Cardiovascular status: blood pressure returned to baseline and stable Postop Assessment: no headache, no backache and no apparent nausea or vomiting Anesthetic complications: no   No notable events documented.  Last Vitals:  Vitals:   07/11/23 1045 07/11/23 1111  BP:  (!) 156/77  Pulse: 64 72  Resp: 16 16  Temp: 36.7 C 36.4 C  SpO2: 97% 99%    Last Pain:  Vitals:   07/11/23 1111  TempSrc: Oral  PainSc: 0-No pain                 Mariann Barter

## 2023-07-11 NOTE — Interval H&P Note (Signed)
History and Physical Interval Note:  07/11/2023 6:32 AM  David Goodman  has presented today for surgery, with the diagnosis of left knee osteoarthritis.  The various methods of treatment have been discussed with the patient and family. After consideration of risks, benefits and other options for treatment, the patient has consented to  Procedure(s): TOTAL KNEE ARTHROPLASTY (Left) as a surgical intervention.  The patient's history has been reviewed, patient examined, no change in status, stable for surgery.  I have reviewed the patient's chart and labs.  Questions were answered to the patient's satisfaction.     [name] 

## 2023-07-11 NOTE — Op Note (Signed)
OPERATIVE REPORT-TOTAL KNEE ARTHROPLASTY   Pre-operative diagnosis- Osteoarthritis  Left knee(s)  Post-operative diagnosis- Osteoarthritis Left knee(s)  Procedure-  Left  Total Knee Arthroplasty  Surgeon- Gus Rankin. , MD  Assistant- Arther Abbott, PA-C   Anesthesia-   Adductor canal block and spinal  EBL- 25 ml   Drains None  Tourniquet time-  Total Tourniquet Time Documented: Thigh (Left) - 35 minutes Total: Thigh (Left) - 35 minutes     Complications- None  Condition-PACU - hemodynamically stable.   Brief Clinical Note    David Goodman is a 82 y.o. year old male with end stage OA of his left knee with progressively worsening pain and dysfunction. He has constant pain, with activity and at rest and significant functional deficits with difficulties even with ADLs. He has had extensive non-op management including analgesics, injections of cortisone and viscosupplements, and home exercise program, but remains in significant pain with significant dysfunction. Radiographs show bone on bone arthritis lateral and patellofemoral with valgus deformity. He presents now for left Total Knee Arthroplasty.    Procedure in detail---   The patient is brought into the operating room and positioned supine on the operating table. After successful administration of  Adductor canal block and spinal,   a tourniquet is placed high on the  Left thigh(s) and the lower extremity is prepped and draped in the usual sterile fashion. Time out is performed by the operating team and then the  Left lower extremity is wrapped in Esmarch, knee flexed and the tourniquet inflated to 300 mmHg.       A midline incision is made with a ten blade through the subcutaneous tissue to the level of the extensor mechanism. A fresh blade is used to make a medial parapatellar arthrotomy. Soft tissue over the proximal medial tibia is subperiosteally elevated to the joint line with a knife and into the semimembranosus  bursa with a Cobb elevator. Soft tissue over the proximal lateral tibia is elevated with attention being paid to avoiding the patellar tendon on the tibial tubercle. The patella is everted, knee flexed 90 degrees and the ACL and PCL are removed. Findings are bone on bone lateral and patellofemoral with large global osteophytes        The drill is used to create a starting hole in the distal femur and the canal is thoroughly irrigated with sterile saline to remove the fatty contents. The 5 degree Left  valgus alignment guide is placed into the femoral canal and the distal femoral cutting block is pinned to remove 9 mm off the distal femur. Resection is made with an oscillating saw.      The tibia is subluxed forward and the menisci are removed. The extramedullary alignment guide is placed referencing proximally at the medial aspect of the tibial tubercle and distally along the second metatarsal axis and tibial crest. The block is pinned to remove 2mm off the more deficient lateral  side. Resection is made with an oscillating saw. Size 6is the most appropriate size for the tibia and the proximal tibia is prepared with the modular drill and keel punch for that size.      The femoral sizing guide is placed and size 6 is most appropriate. Rotation is marked off the epicondylar axis and confirmed by creating a rectangular flexion gap at 90 degrees. The size 6 cutting block is pinned in this rotation and the anterior, posterior and chamfer cuts are made with the oscillating saw. The intercondylar block is then placed  and that cut is made.      Trial size 6 tibial component, trial size 6 posterior stabilized femur and a 8  mm posterior stabilized rotating platform insert trial is placed. Full extension is achieved with excellent varus/valgus and anterior/posterior balance throughout full range of motion. The patella is everted and thickness measured to be 24  mm. Free hand resection is taken to 14 mm, a 38 template is  placed, lug holes are drilled, trial patella is placed, and it tracks normally. Osteophytes are removed off the posterior femur with the trial in place. All trials are removed and the cut bone surfaces prepared with pulsatile lavage. Cement is mixed and once ready for implantation, the size 6 tibial implant, size  6 posterior stabilized femoral component, and the size 38 patella are cemented in place and the patella is held with the clamp. The trial insert is placed and the knee held in full extension. The Exparel (20 ml mixed with 60 ml saline) is injected into the extensor mechanism, posterior capsule, medial and lateral gutters and subcutaneous tissues.  All extruded cement is removed and once the cement is hard the permanent 8 mm posterior stabilized rotating platform insert is placed into the tibial tray.      The wound is copiously irrigated with saline solution and the extensor mechanism closed with # 0 Stratofix suture. The tourniquet is released for a total tourniquet time of 35  minutes. Flexion against gravity is 140 degrees and the patella tracks normally. Subcutaneous tissue is closed with 2.0 vicryl and subcuticular with running 4.0 Monocryl. The incision is cleaned and dried and steri-strips and a bulky sterile dressing are applied. The limb is placed into a knee immobilizer and the patient is awakened and transported to recovery in stable condition.      Please note that a surgical assistant was a medical necessity for this procedure in order to perform it in a safe and expeditious manner. Surgical assistant was necessary to retract the ligaments and vital neurovascular structures to prevent injury to them and also necessary for proper positioning of the limb to allow for anatomic placement of the prosthesis.   Gus Rankin , MD    07/11/2023, 9:28 AM

## 2023-07-11 NOTE — Care Plan (Signed)
Ortho Bundle Case Management Note  Patient Details  Name: David Goodman MRN: [id number] Date of Birth: 20-Oct-1941                  L TKA on 07/11/23.  DCP: Home with wife.  DME: RW ordered through [employer].  PT: EO 8/16 at 11:00am   DME Arranged:  Walker rolling DME Agency:  [employer]    Additional Comments: Please contact me with any questions of if this plan should need to change.    Despina Pole, Case Manager  [facility]  [phone] 07/11/2023, 1:18 PM

## 2023-07-11 NOTE — Evaluation (Signed)
Physical Therapy Evaluation Patient Details Name: David Goodman MRN: [id number] DOB: 20-Aug-1941 Today's Date: 07/11/2023  History of Present Illness  82 yo male s/p L TKA on  07/11/23; PMH: HTN, CAD, HF, syncope, pulmonary fibrosis post COVID, SVT  Clinical Impression  Pt is s/p TKA resulting in the deficits listed below (see PT Problem List).  Pt progressing well, motivated to work with PT; amb ~ 76' with  RW and min assist.  Pt will benefit from acute skilled PT to increase their independence and safety with mobility to allow discharge.          If plan is discharge home, recommend the following: A little help with walking and/or transfers;Help with stairs or ramp for entrance;Assistance with cooking/housework   Can travel by private vehicle        Equipment Recommendations Rolling walker (2 wheels)  Recommendations for Other Services       Functional Status Assessment Patient has had a recent decline in their functional status and demonstrates the ability to make significant improvements in function in a reasonable and predictable amount of time.     Precautions / Restrictions Precautions Precautions: Fall;Knee Restrictions Weight Bearing Restrictions: No LLE Weight Bearing: Weight bearing as tolerated      Mobility  Bed Mobility Overal bed mobility: Needs Assistance Bed Mobility: Supine to Sit     Supine to sit: Contact guard     General bed mobility comments: cues to self assist, incr time    Transfers Overall transfer level: Needs assistance Equipment used: Rolling walker (2 wheels) Transfers: Sit to/from Stand Sit to Stand: Min assist           General transfer comment: assist to rise and tranistion to RW, cues for hand placement    Ambulation/Gait Ambulation/Gait assistance: Min assist Gait Distance (Feet): 60 Feet Assistive device: Rolling walker (2 wheels) Gait Pattern/deviations: Step-to pattern, Decreased stance time - left       General  Gait Details: cues for sequence and RW position  Stairs            Wheelchair Mobility     Tilt Bed    Modified Rankin (Stroke Patients Only)       Balance Overall balance assessment: No apparent balance deficits (not formally assessed)                                           Pertinent Vitals/Pain Pain Assessment Pain Assessment: 0-10 Pain Score: 4  Pain Location: left knee Pain Descriptors / Indicators: Aching, Sore, Guarding Pain Intervention(s): Limited activity within patient's tolerance, Monitored during session, Premedicated before session, Repositioned    Home Living Family/patient expects to be discharged to:: Private residence Living Arrangements: Spouse/significant other Available Help at Discharge: Family Type of Home: Other(Comment) (condo) Home Access: Level entry       Home Layout: One level Home Equipment: Other (comment) Additional Comments: 3 wheeled    Prior Function Prior Level of Function : Independent/Modified Independent                     Extremity/Trunk Assessment   Upper Extremity Assessment Upper Extremity Assessment: Overall WFL for tasks assessed    Lower Extremity Assessment Lower Extremity Assessment: LLE deficits/detail LLE Deficits / Details: ankle WFL, knee and hip grossly 2+ to  3/5       Communication  Cognition Arousal: Alert Behavior During Therapy: WFL for tasks assessed/performed Overall Cognitive Status: Within Functional Limits for tasks assessed                                          General Comments      Exercises Total Joint Exercises Ankle Circles/Pumps: AROM, Both, 10 reps   Assessment/Plan    PT Assessment Patient needs continued PT services  PT Problem List Decreased strength;Decreased range of motion;Decreased activity tolerance;Decreased mobility;Decreased knowledge of precautions;Pain;Decreased knowledge of use of DME       PT  Treatment Interventions DME instruction;Balance training;Gait training;Functional mobility training;Therapeutic activities;Therapeutic exercise;Patient/family education    PT Goals (Current goals can be found in the Care Plan section)  Acute Rehab PT Goals PT Goal Formulation: With patient Time For Goal Achievement: 07/18/23 Potential to Achieve Goals: Good    Frequency 7X/week     Co-evaluation               AM-PAC PT "6 Clicks" Mobility  Outcome Measure Help needed turning from your back to your side while in a flat bed without using bedrails?: A Little Help needed moving from lying on your back to sitting on the side of a flat bed without using bedrails?: A Little Help needed moving to and from a bed to a chair (including a wheelchair)?: A Little Help needed standing up from a chair using your arms (e.g., wheelchair or bedside chair)?: A Little Help needed to walk in hospital room?: A Little Help needed climbing 3-5 steps with a railing? : A Little 6 Click Score: 18    End of Session Equipment Utilized During Treatment: Gait belt Activity Tolerance: Patient tolerated treatment well Patient left: in chair;with call bell/phone within reach;with chair alarm set;with family/visitor present Nurse Communication: Mobility status PT Visit Diagnosis: Other abnormalities of gait and mobility (R26.89);Difficulty in walking, not elsewhere classified (R26.2)    Time: 1610-9604 PT Time Calculation (min) (ACUTE ONLY): 19 min   Charges:   PT Evaluation $PT Eval Low Complexity: 1 Low   PT General Charges $$ ACUTE PT VISIT: 1 Visit         , PT  Acute Rehab Dept [facility]) [phone]  07/11/2023   [facility] 07/11/2023, 3:29 PM

## 2023-07-11 NOTE — Anesthesia Procedure Notes (Signed)
Procedure Name: MAC Date/Time: 07/11/2023 8:15 AM  Performed by: Elisabeth Cara, CRNAPre-anesthesia Checklist: Patient identified, Emergency Drugs available, Suction available, Patient being monitored and Timeout performed Patient Re-evaluated:Patient Re-evaluated prior to induction Oxygen Delivery Method: Simple face mask Placement Confirmation: positive ETCO2 Dental Injury: Teeth and Oropharynx as per pre-operative assessment

## 2023-07-11 NOTE — Anesthesia Procedure Notes (Signed)
Spinal  Patient location during procedure: post-op Start time: 07/11/2023 8:23 AM End time: 07/11/2023 8:27 AM Reason for block: surgical anesthesia Staffing Performed: anesthesiologist  Anesthesiologist: Mariann Barter, MD Performed by: Mariann Barter, MD Authorized by: Mariann Barter, MD   Preanesthetic Checklist Completed: patient identified, IV checked, site marked, risks and benefits discussed, surgical consent, monitors and equipment checked, pre-op evaluation and timeout performed Spinal Block Patient position: sitting Prep: DuraPrep and site prepped and draped Patient monitoring: heart rate, cardiac monitor, continuous pulse ox and blood pressure Approach: midline Location: L3-4 Injection technique: single-shot Needle Needle type: Sprotte  Needle gauge: 24 G Needle length: 5 cm Assessment Events: CSF return

## 2023-07-11 NOTE — Progress Notes (Signed)
Orthopedic Tech Progress Note Patient Details:  Shaker Sarker Dec 28, 1940 [id number]  CPM Left Knee CPM Left Knee: On Left Knee Flexion (Degrees): 40 Left Knee Extension (Degrees): 10  Post Interventions Patient Tolerated: Well Instructions Provided: Care of device, Adjustment of device  Sherilyn Banker 07/11/2023, 1:07 PM

## 2023-07-12 MED ORDER — TRAMADOL HCL 50 MG PO TABS: 50.0000 mg | ORAL_TABLET | Freq: Four times a day (QID) | ORAL | 0 refills | Status: DC | PRN

## 2023-07-12 MED ORDER — OXYCODONE HCL 5 MG PO TABS: 5.0000 mg | ORAL_TABLET | Freq: Three times a day (TID) | ORAL | 0 refills | Status: DC | PRN

## 2023-07-12 MED ORDER — ASPIRIN 81 MG PO CHEW: 81.0000 mg | CHEWABLE_TABLET | Freq: Two times a day (BID) | ORAL | 0 refills | Status: AC

## 2023-07-12 MED ORDER — ONDANSETRON HCL 4 MG PO TABS: 4.0000 mg | ORAL_TABLET | Freq: Four times a day (QID) | ORAL | 0 refills | Status: DC | PRN

## 2023-07-12 MED ORDER — METHOCARBAMOL 500 MG PO TABS: 500.0000 mg | ORAL_TABLET | Freq: Four times a day (QID) | ORAL | 0 refills | Status: DC | PRN

## 2023-07-12 NOTE — Progress Notes (Signed)
PT TX NOTE  07/12/23 1500  PT Visit Information  Last PT Received On 07/12/23  Assistance Needed Pt continues to make excellent progress, meeting PT goals, feels ready for d/c after pm session today. Pt is motivated, discussed steady incr in activity and not over doing it. Pt has OPPT appointment tomorrow. Wife present for session.   History of Present Illness 82 yo male s/p L TKA on  07/11/23; PMH: HTN, CAD, HF, syncope, pulmonary fibrosis post COVID, SVT  Precautions  Precautions Fall;Knee  Restrictions  Weight Bearing Restrictions No  LLE Weight Bearing WBAT  Pain Assessment  Pain Assessment 0-10  Pain Score 3  Pain Location left knee  Pain Descriptors / Indicators Aching;Sore;Guarding  Pain Intervention(s) Limited activity within patient's tolerance;Monitored during session  Cognition  Arousal Alert  Behavior During Therapy WFL for tasks assessed/performed  Overall Cognitive Status Within Functional Limits for tasks assessed  Bed Mobility  Overal bed mobility Needs Assistance  Bed Mobility Supine to Sit  Supine to sit Contact guard  General bed mobility comments cues to self assist, incr time  Transfers  Overall transfer level Needs assistance  Equipment used Rolling walker (2 wheels)  Transfers Sit to/from Stand  Sit to Stand Contact guard assist  General transfer comment cues for overall safety and hand placement  Ambulation/Gait  Ambulation/Gait assistance Contact guard assist  Gait Distance (Feet) 120 Feet  Assistive device Rolling walker (2 wheels)  Gait Pattern/deviations Step-to pattern;Decreased stance time - left  General Gait Details cues for sequence and RW position  Total Joint Exercises  Ankle Circles/Pumps AROM;Both;10 reps  Heel Slides AAROM;AROM;Left;10 reps  Straight Leg Raises AROM;Left;10 reps  PT - End of Session  Equipment Utilized During Treatment Gait belt  Activity Tolerance Patient tolerated treatment well  Patient left in chair;with call  bell/phone within reach;with chair alarm set  Nurse Communication Mobility status   PT - Assessment/Plan  PT Visit Diagnosis Other abnormalities of gait and mobility (R26.89);Difficulty in walking, not elsewhere classified (R26.2)  PT Frequency (ACUTE ONLY) 7X/week  Follow Up Recommendations Follow physician's recommendations for discharge plan and follow up therapies  Patient can return home with the following A little help with walking and/or transfers;Help with stairs or ramp for entrance;Assistance with cooking/housework  PT equipment Rolling walker (2 wheels)  AM-PAC PT "6 Clicks" Mobility Outcome Measure (Version 2)  Help needed turning from your back to your side while in a flat bed without using bedrails? 3  Help needed moving from lying on your back to sitting on the side of a flat bed without using bedrails? 3  Help needed moving to and from a bed to a chair (including a wheelchair)? 3  Help needed standing up from a chair using your arms (e.g., wheelchair or bedside chair)? 3  Help needed to walk in hospital room? 3  Help needed climbing 3-5 steps with a railing?  3  6 Click Score 18  Consider Recommendation of Discharge To: Home with [facility]  PT Goal Progression  Progress towards PT goals Progressing toward goals  Acute Rehab PT Goals  PT Goal Formulation With patient  Time For Goal Achievement 07/18/23  Potential to Achieve Goals Good  PT Time Calculation  PT Start Time (ACUTE ONLY) 1500  PT Stop Time (ACUTE ONLY) 1524  PT Time Calculation (min) (ACUTE ONLY) 24 min  PT General Charges  $$ ACUTE PT VISIT 1 Visit  PT Treatments  $Gait Training 23-37 mins

## 2023-07-12 NOTE — Care Management Obs Status (Signed)
MEDICARE OBSERVATION STATUS NOTIFICATION   Patient Details  Name: David Goodman MRN: [id number] Date of Birth: 10-Jul-1941   Medicare Observation Status Notification Given:  Yes    Amada Jupiter, LCSW 07/12/2023, 10:21 AM

## 2023-07-12 NOTE — Progress Notes (Signed)
Subjective: 1 Day Post-Op Procedure(s) (LRB): TOTAL KNEE ARTHROPLASTY (Left) Patient reports pain as mild.   Patient seen in rounds by Dr. Lequita Halt. Patient is well, and has had no acute complaints or problems Increased pain yesterday, improved. Denies chest pain, SOB, or calf pain. Foley catheter removed this AM.  We will continue therapy today, ambulated 60' yesterday.   Objective: Vital signs in last 24 hours: Temp:  [97.5 F (36.4 C)-98.2 F (36.8 C)] 97.5 F (36.4 C) (08/13 0534) Pulse Rate:  [64-78] 76 (08/13 0534) Resp:  [15-18] 16 (08/13 0534) BP: (122-156)/(66-92) 150/82 (08/13 0534) SpO2:  [95 %-100 %] 97 % (08/13 0534)  Intake/Output from previous day:  Intake/Output Summary (Last 24 hours) at 07/12/2023 0823 Last data filed at 07/12/2023 0600 Gross per 24 hour  Intake 4294.38 ml  Output 3250 ml  Net 1044.38 ml     Intake/Output this shift: No intake/output data recorded.  Labs: Recent Labs    07/12/23 0341  HGB 11.2*   Recent Labs    07/12/23 0341  WBC 15.3*  RBC 3.90*  HCT 33.4*  PLT 214   Recent Labs    07/12/23 0341  NA 139  K 3.8  CL 106  CO2 22  BUN 20  CREATININE 0.95  GLUCOSE 121*  CALCIUM 8.5*   No results for input(s): "LABPT", "INR" in the last 72 hours.  Exam: General - Patient is Alert and Oriented Extremity - Neurologically intact Neurovascular intact Sensation intact distally Dorsiflexion/Plantar flexion intact Dressing - dressing C/D/I Motor Function - intact, moving foot and toes well on exam.   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)    HYPERLIPIDEMIA 07/13/2007   HYPERTENSION 07/13/2007   Impaired glucose tolerance 09/27/2016   Iron deficiency anemia due to chronic blood loss 02/27/2021   NEPHROLITHIASIS, 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   Weight as of this encounter: 78 kg. Advance diet Up with therapy D/C IV fluids   Patient's anticipated LOS is less than 2 midnights, meeting these requirements: - Younger than 59 - Lives within 1 hour of care - Has a competent adult at home to recover with post-op recover - NO history of  - Chronic pain requiring opioids  - Diabetes  - Heart attack  - Stroke  - DVT/VTE  - Cardiac arrhythmia  - Respiratory Failure/COPD  - Renal failure  - Anemia  - Advanced Liver disease  DVT Prophylaxis - Aspirin Weight bearing as tolerated. Continue therapy.  Plan is to go Home after hospital stay. Plan for discharge later today if progresses with  therapy and meeting goals. [facility]. Follow-up in the office in 2 weeks.  The PDMP database was reviewed today prior to any opioid medications being prescribed to this patient.  Arther Abbott, PA-C Orthopedic Surgery [phone] 07/12/2023, 8:23 AM

## 2023-07-12 NOTE — Progress Notes (Signed)
Physical Therapy Treatment Patient Details Name: David Goodman MRN: [id number] DOB: 1941-07-03 Today's Date: 07/12/2023   History of Present Illness 82 yo male s/p L TKA on  07/11/23; PMH: HTN, CAD, HF, syncope, pulmonary fibrosis post COVID, SVT    PT Comments  Pt progressing toward goals, motivated to d/c home later today.   If plan is discharge home, recommend the following: A little help with walking and/or transfers;Help with stairs or ramp for entrance;Assistance with cooking/housework   Can travel by private Scientist, research (medical) walker (2 wheels)    Recommendations for Other Services       Precautions / Restrictions Precautions Precautions: Fall;Knee Restrictions Weight Bearing Restrictions: No LLE Weight Bearing: Weight bearing as tolerated     Mobility  Bed Mobility Overal bed mobility: Needs Assistance Bed Mobility: Supine to Sit     Supine to sit: Contact guard     General bed mobility comments: cues to self assist, incr time    Transfers Overall transfer level: Needs assistance Equipment used: Rolling walker (2 wheels) Transfers: Sit to/from Stand Sit to Stand: Contact guard assist           General transfer comment: cues for overall safety and hand placement    Ambulation/Gait Ambulation/Gait assistance: Contact guard assist Gait Distance (Feet): 80 Feet Assistive device: Rolling walker (2 wheels) Gait Pattern/deviations: Step-to pattern, Decreased stance time - left       General Gait Details: cues for sequence and RW position   Stairs             Wheelchair Mobility     Tilt Bed    Modified Rankin (Stroke Patients Only)       Balance                                            Cognition Arousal: Alert Behavior During Therapy: WFL for tasks assessed/performed Overall Cognitive Status: Within Functional Limits for tasks assessed                                           Exercises Total Joint Exercises Ankle Circles/Pumps: AROM, Both, 10 reps Quad Sets: AROM, Both, 10 reps Heel Slides: AAROM, AROM, Left, 10 reps Hip ABduction/ADduction: AROM, AAROM, Left, 10 reps Straight Leg Raises: AROM, Left, 5 reps    General Comments        Pertinent Vitals/Pain Pain Assessment Pain Assessment: 0-10 Pain Score: 4  Pain Location: left knee Pain Descriptors / Indicators: Aching, Sore, Guarding Pain Intervention(s): Limited activity within patient's tolerance, Monitored during session, Premedicated before session, Repositioned    Home Living                          Prior Function            PT Goals (current goals can now be found in the care plan section) Acute Rehab PT Goals PT Goal Formulation: With patient Time For Goal Achievement: 07/18/23 Potential to Achieve Goals: Good Progress towards PT goals: Progressing toward goals    Frequency    7X/week      PT Plan      Co-evaluation  AM-PAC PT "6 Clicks" Mobility   Outcome Measure  Help needed turning from your back to your side while in a flat bed without using bedrails?: A Little Help needed moving from lying on your back to sitting on the side of a flat bed without using bedrails?: A Little Help needed moving to and from a bed to a chair (including a wheelchair)?: A Little Help needed standing up from a chair using your arms (e.g., wheelchair or bedside chair)?: A Little Help needed to walk in hospital room?: A Little Help needed climbing 3-5 steps with a railing? : A Little 6 Click Score: 18    End of Session Equipment Utilized During Treatment: Gait belt Activity Tolerance: Patient tolerated treatment well Patient left: in chair;with call bell/phone within reach;with chair alarm set Nurse Communication: Mobility status PT Visit Diagnosis: Other abnormalities of gait and mobility (R26.89);Difficulty in walking, not elsewhere classified  (R26.2)     Time: 6962-9528 PT Time Calculation (min) (ACUTE ONLY): 18 min  Charges:    $Gait Training: 8-22 mins PT General Charges $$ ACUTE PT VISIT: 1 Visit                     , PT  Acute Rehab Dept ([facility]) [phone]  07/12/2023    [facility] 07/12/2023, 2:09 PM

## 2023-07-12 NOTE — TOC Transition Note (Signed)
Transition of Care [facility]) - CM/SW Discharge Note   Patient Details  Name: David Goodman MRN: [id number] Date of Birth: Nov 29, 1941  [facility] CM/SW Contact:  Amada Jupiter, LCSW Phone Number: 07/12/2023, 10:01 AM   Clinical Narrative:    Met with pt today and confirming he has received RW to room via [employer].  OPPT already arranged with [facility].  No further TOC needs.   Final next level of care: OP Rehab Barriers to Discharge: No Barriers Identified   Patient Goals and CMS Choice      Discharge Placement                         Discharge Plan and Services Additional resources added to the After Visit Summary for                  DME Arranged: Walker rolling DME Agency: [employer]                  Social Determinants of Health (SDOH) Interventions SDOH Screenings   Food Insecurity: No Food Insecurity (07/11/2023)  Housing: Low Risk  (07/11/2023)  Transportation Needs: No Transportation Needs (07/11/2023)  Utilities: Not At Risk (07/11/2023)  Alcohol Screen: Low Risk  (06/02/2023)  Depression (PHQ2-9): Low Risk  (06/06/2023)  Financial Resource Strain: Low Risk  (06/02/2023)  Physical Activity: Sufficiently Active (06/02/2023)  Social Connections: Moderately Integrated (06/02/2023)  Stress: No Stress Concern Present (06/02/2023)  Tobacco Use: Low Risk  (07/11/2023)     Readmission Risk Interventions     No data to display

## 2023-07-13 ENCOUNTER — Encounter (HOSPITAL_COMMUNITY): Payer: Self-pay | Admitting: Orthopedic Surgery

## 2023-07-14 ENCOUNTER — Emergency Department (HOSPITAL_COMMUNITY): Payer: Medicare Other

## 2023-07-14 ENCOUNTER — Inpatient Hospital Stay (HOSPITAL_COMMUNITY)
Admission: EM | Admit: 2023-07-14 | Discharge: 2023-07-20 | DRG: 854 | Disposition: A | Discharge status: Home / Self Care | Payer: Medicare Other | Attending: Internal Medicine | Admitting: Internal Medicine

## 2023-07-14 ENCOUNTER — Encounter (HOSPITAL_COMMUNITY): Payer: Self-pay | Admitting: Emergency Medicine

## 2023-07-14 ENCOUNTER — Other Ambulatory Visit: Payer: Self-pay

## 2023-07-14 DIAGNOSIS — Z6831 Body mass index (BMI) 31.0-31.9, adult: Secondary | ICD-10-CM

## 2023-07-14 DIAGNOSIS — K5900 Constipation, unspecified: Secondary | ICD-10-CM | POA: Diagnosis not present

## 2023-07-14 DIAGNOSIS — K529 Noninfective gastroenteritis and colitis, unspecified: Secondary | ICD-10-CM

## 2023-07-14 DIAGNOSIS — E871 Hypo-osmolality and hyponatremia: Secondary | ICD-10-CM | POA: Diagnosis present

## 2023-07-14 DIAGNOSIS — E876 Hypokalemia: Secondary | ICD-10-CM | POA: Diagnosis present

## 2023-07-14 DIAGNOSIS — Z79899 Other long term (current) drug therapy: Secondary | ICD-10-CM

## 2023-07-14 DIAGNOSIS — E872 Acidosis, unspecified: Secondary | ICD-10-CM | POA: Diagnosis present

## 2023-07-14 DIAGNOSIS — I10 Essential (primary) hypertension: Secondary | ICD-10-CM | POA: Diagnosis present

## 2023-07-14 DIAGNOSIS — F419 Anxiety disorder, unspecified: Secondary | ICD-10-CM | POA: Diagnosis present

## 2023-07-14 DIAGNOSIS — H4010X0 Unspecified open-angle glaucoma, stage unspecified: Secondary | ICD-10-CM | POA: Diagnosis present

## 2023-07-14 DIAGNOSIS — E860 Dehydration: Secondary | ICD-10-CM | POA: Diagnosis present

## 2023-07-14 DIAGNOSIS — N400 Enlarged prostate without lower urinary tract symptoms: Secondary | ICD-10-CM | POA: Diagnosis present

## 2023-07-14 DIAGNOSIS — A09 Infectious gastroenteritis and colitis, unspecified: Secondary | ICD-10-CM | POA: Diagnosis present

## 2023-07-14 DIAGNOSIS — Z8711 Personal history of peptic ulcer disease: Secondary | ICD-10-CM

## 2023-07-14 DIAGNOSIS — Y838 Other surgical procedures as the cause of abnormal reaction of the patient, or of later complication, without mention of misadventure at the time of the procedure: Secondary | ICD-10-CM | POA: Diagnosis present

## 2023-07-14 DIAGNOSIS — K219 Gastro-esophageal reflux disease without esophagitis: Secondary | ICD-10-CM | POA: Diagnosis present

## 2023-07-14 DIAGNOSIS — R6510 Systemic inflammatory response syndrome (SIRS) of non-infectious origin without acute organ dysfunction: Secondary | ICD-10-CM | POA: Diagnosis present

## 2023-07-14 DIAGNOSIS — Z91199 Patient's noncompliance with other medical treatment and regimen due to unspecified reason: Secondary | ICD-10-CM

## 2023-07-14 DIAGNOSIS — A419 Sepsis, unspecified organism: Secondary | ICD-10-CM | POA: Diagnosis not present

## 2023-07-14 DIAGNOSIS — R6520 Severe sepsis without septic shock: Secondary | ICD-10-CM | POA: Diagnosis present

## 2023-07-14 DIAGNOSIS — Z955 Presence of coronary angioplasty implant and graft: Secondary | ICD-10-CM

## 2023-07-14 DIAGNOSIS — H903 Sensorineural hearing loss, bilateral: Secondary | ICD-10-CM | POA: Diagnosis present

## 2023-07-14 DIAGNOSIS — R0902 Hypoxemia: Secondary | ICD-10-CM | POA: Diagnosis not present

## 2023-07-14 DIAGNOSIS — Z743 Need for continuous supervision: Secondary | ICD-10-CM | POA: Diagnosis not present

## 2023-07-14 DIAGNOSIS — I2510 Atherosclerotic heart disease of native coronary artery without angina pectoris: Secondary | ICD-10-CM | POA: Diagnosis present

## 2023-07-14 DIAGNOSIS — K9189 Other postprocedural complications and disorders of digestive system: Secondary | ICD-10-CM | POA: Diagnosis present

## 2023-07-14 DIAGNOSIS — K567 Ileus, unspecified: Secondary | ICD-10-CM | POA: Diagnosis present

## 2023-07-14 DIAGNOSIS — Z8616 Personal history of COVID-19: Secondary | ICD-10-CM

## 2023-07-14 DIAGNOSIS — K5730 Diverticulosis of large intestine without perforation or abscess without bleeding: Secondary | ICD-10-CM | POA: Diagnosis present

## 2023-07-14 DIAGNOSIS — Z7982 Long term (current) use of aspirin: Secondary | ICD-10-CM

## 2023-07-14 DIAGNOSIS — E785 Hyperlipidemia, unspecified: Secondary | ICD-10-CM | POA: Diagnosis present

## 2023-07-14 DIAGNOSIS — G4733 Obstructive sleep apnea (adult) (pediatric): Secondary | ICD-10-CM

## 2023-07-14 DIAGNOSIS — D649 Anemia, unspecified: Secondary | ICD-10-CM | POA: Diagnosis present

## 2023-07-14 DIAGNOSIS — M1712 Unilateral primary osteoarthritis, left knee: Secondary | ICD-10-CM | POA: Diagnosis present

## 2023-07-14 DIAGNOSIS — Z1152 Encounter for screening for COVID-19: Secondary | ICD-10-CM

## 2023-07-14 DIAGNOSIS — I110 Hypertensive heart disease with heart failure: Secondary | ICD-10-CM | POA: Diagnosis present

## 2023-07-14 DIAGNOSIS — J309 Allergic rhinitis, unspecified: Secondary | ICD-10-CM | POA: Diagnosis present

## 2023-07-14 DIAGNOSIS — R112 Nausea with vomiting, unspecified: Secondary | ICD-10-CM | POA: Diagnosis not present

## 2023-07-14 DIAGNOSIS — Z87442 Personal history of urinary calculi: Secondary | ICD-10-CM

## 2023-07-14 DIAGNOSIS — Z8719 Personal history of other diseases of the digestive system: Secondary | ICD-10-CM

## 2023-07-14 DIAGNOSIS — K5909 Other constipation: Secondary | ICD-10-CM | POA: Diagnosis present

## 2023-07-14 DIAGNOSIS — E669 Obesity, unspecified: Secondary | ICD-10-CM | POA: Diagnosis present

## 2023-07-14 DIAGNOSIS — K560 Paralytic ileus: Secondary | ICD-10-CM | POA: Diagnosis present

## 2023-07-14 DIAGNOSIS — R6889 Other general symptoms and signs: Secondary | ICD-10-CM | POA: Diagnosis not present

## 2023-07-14 DIAGNOSIS — I5022 Chronic systolic (congestive) heart failure: Secondary | ICD-10-CM | POA: Diagnosis present

## 2023-07-14 LAB — URINALYSIS, W/ REFLEX TO CULTURE (INFECTION SUSPECTED)
Bilirubin Urine: NEGATIVE
Glucose, UA: NEGATIVE mg/dL
Hgb urine dipstick: NEGATIVE
Ketones, ur: NEGATIVE mg/dL
Leukocytes,Ua: NEGATIVE
Nitrite: NEGATIVE
Protein, ur: NEGATIVE mg/dL
Specific Gravity, Urine: >1.046 — ABNORMAL HIGH (ref 1.005–1.030)
pH: 5 (ref 5.0–8.0)

## 2023-07-14 LAB — I-STAT CG4 LACTIC ACID, ED
Lactic Acid, Venous: 2.4 mmol/L (ref 0.5–1.9)
Lactic Acid, Venous: 2.5 mmol/L (ref 0.5–1.9)

## 2023-07-14 LAB — COMPREHENSIVE METABOLIC PANEL
ALT: 18 U/L (ref 0–44)
AST: 25 U/L (ref 15–41)
Albumin: 3.5 g/dL (ref 3.5–5.0)
Alkaline Phosphatase: 49 U/L (ref 38–126)
Anion gap: 12 (ref 5–15)
BUN: 29 mg/dL — ABNORMAL HIGH (ref 8–23)
CO2: 26 mmol/L (ref 22–32)
Calcium: 8.1 mg/dL — ABNORMAL LOW (ref 8.9–10.3)
Chloride: 94 mmol/L — ABNORMAL LOW (ref 98–111)
Creatinine, Ser: 1.11 mg/dL (ref 0.61–1.24)
GFR, Estimated: >60 mL/min (ref >60)
Glucose, Bld: 111 mg/dL — ABNORMAL HIGH (ref 70–99)
Potassium: 3.2 mmol/L — ABNORMAL LOW (ref 3.5–5.1)
Sodium: 132 mmol/L — ABNORMAL LOW (ref 135–145)
Total Bilirubin: 1.7 mg/dL — ABNORMAL HIGH (ref 0.3–1.2)
Total Protein: 6 g/dL — ABNORMAL LOW (ref 6.5–8.1)

## 2023-07-14 LAB — CBC WITH DIFFERENTIAL/PLATELET
Abs Immature Granulocytes: 0.07 10*3/uL (ref 0.00–0.07)
Basophils Absolute: 0 10*3/uL (ref 0.0–0.1)
Basophils Relative: 0 %
Eosinophils Absolute: 0 10*3/uL (ref 0.0–0.5)
Eosinophils Relative: 0 %
HCT: 39.8 % (ref 39.0–52.0)
Hemoglobin: 12.5 g/dL — ABNORMAL LOW (ref 13.0–17.0)
Immature Granulocytes: 0 %
Lymphocytes Relative: 4 %
Lymphs Abs: 0.7 10*3/uL (ref 0.7–4.0)
MCH: 29.2 pg (ref 26.0–34.0)
MCHC: 31.4 g/dL (ref 30.0–36.0)
MCV: 93 fL (ref 80.0–100.0)
Monocytes Absolute: 0.6 10*3/uL (ref 0.1–1.0)
Monocytes Relative: 3 %
Neutro Abs: 17.5 10*3/uL — ABNORMAL HIGH (ref 1.7–7.7)
Neutrophils Relative %: 93 %
Platelets: 228 10*3/uL (ref 150–400)
RBC: 4.28 MIL/uL (ref 4.22–5.81)
RDW: 14.5 % (ref 11.5–15.5)
WBC: 18.9 10*3/uL — ABNORMAL HIGH (ref 4.0–10.5)
nRBC: 0 % (ref 0.0–0.2)

## 2023-07-14 LAB — TROPONIN I (HIGH SENSITIVITY)
Troponin I (High Sensitivity): 12 ng/L (ref <18)
Troponin I (High Sensitivity): 17 ng/L (ref <18)

## 2023-07-14 LAB — SARS CORONAVIRUS 2 BY RT PCR: SARS Coronavirus 2 by RT PCR: NEGATIVE

## 2023-07-14 MED ORDER — LACTATED RINGERS IV BOLUS
1000.0000 mL | Freq: Once | INTRAVENOUS | Status: AC
  Administered 2023-07-14: 1000 mL via INTRAVENOUS

## 2023-07-14 MED ORDER — LACTATED RINGERS IV BOLUS
500.0000 mL | Freq: Once | INTRAVENOUS | Status: AC
  Administered 2023-07-14: 500 mL via INTRAVENOUS

## 2023-07-14 MED ORDER — ENOXAPARIN SODIUM 40 MG/0.4ML IJ SOSY
40.0000 mg | PREFILLED_SYRINGE | INTRAMUSCULAR | Status: DC
  Administered 2023-07-15 – 2023-07-19 (×5): 40 mg via SUBCUTANEOUS
  Filled 2023-07-14 (×6): qty 0.4

## 2023-07-14 MED ORDER — ONDANSETRON HCL 4 MG/2ML IJ SOLN
4.0000 mg | Freq: Once | INTRAMUSCULAR | Status: AC
  Administered 2023-07-14: 4 mg via INTRAVENOUS
  Filled 2023-07-14: qty 2

## 2023-07-14 MED ORDER — ACETAMINOPHEN 325 MG PO TABS
650.0000 mg | ORAL_TABLET | Freq: Four times a day (QID) | ORAL | Status: DC | PRN
  Administered 2023-07-17 – 2023-07-18 (×2): 650 mg via ORAL
  Filled 2023-07-14 (×2): qty 2

## 2023-07-14 MED ORDER — IOHEXOL 350 MG/ML SOLN
100.0000 mL | Freq: Once | INTRAVENOUS | Status: AC | PRN
  Administered 2023-07-14: 100 mL via INTRAVENOUS

## 2023-07-14 MED ORDER — LACTATED RINGERS IV SOLN: INTRAVENOUS | Status: DC

## 2023-07-14 MED ORDER — ACETAMINOPHEN 650 MG RE SUPP: 650.0000 mg | Freq: Four times a day (QID) | RECTAL | Status: DC | PRN

## 2023-07-14 MED ORDER — ONDANSETRON HCL 4 MG/2ML IJ SOLN
4.0000 mg | Freq: Four times a day (QID) | INTRAMUSCULAR | Status: DC | PRN
  Administered 2023-07-15 – 2023-07-18 (×2): 4 mg via INTRAVENOUS
  Filled 2023-07-14 (×2): qty 2

## 2023-07-14 MED ORDER — PIPERACILLIN-TAZOBACTAM 3.375 G IVPB 30 MIN
3.3750 g | Freq: Once | INTRAVENOUS | Status: AC
  Administered 2023-07-14: 3.375 g via INTRAVENOUS
  Filled 2023-07-14: qty 50

## 2023-07-14 MED ORDER — ONDANSETRON HCL 4 MG PO TABS
4.0000 mg | ORAL_TABLET | Freq: Four times a day (QID) | ORAL | Status: DC | PRN
  Administered 2023-07-17: 4 mg via ORAL
  Filled 2023-07-14: qty 1

## 2023-07-14 MED ORDER — VANCOMYCIN HCL 1500 MG/300ML IV SOLN
1500.0000 mg | Freq: Once | INTRAVENOUS | Status: AC
  Administered 2023-07-14: 1500 mg via INTRAVENOUS
  Filled 2023-07-14: qty 300

## 2023-07-14 NOTE — ED Provider Notes (Signed)
[place] EMERGENCY DEPARTMENT AT [facility] Provider Note   CSN: [id number] Arrival date & time: 07/14/23  1604     History  Chief Complaint  Patient presents with   Nausea   Emesis   Constipation    David Goodman is a 82 y.o. male.  HPI      82 year old male with a history of hyperlipidemia, hypertension, CHF, coronary disease, nonsustained PE, peptic ulcer disease, recent left total knee arthroplasty with Dr. Antony Odea on August 12 who presents with concern for nausea and vomiting.  He and family report that has not had a bowel movement since prior to his surgery 3 days ago.  He has had ongoing constipation, today tried taking a laxative, and the developed nausea, vomiting.  He has abdominal discomfort and distention.  Denies chest pain.  He has significant discomfort, nausea. Denies dyspnea, cough, urinary symptoms. No known fevers. Does have constipation at baseline and takes nightly metamucil.   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 Authorizing  Provider  Ascorbic Acid (VITAMIN C) 1000 MG tablet Take 1,000 mg by mouth 2 (two) times a week.    [provider]  aspirin 81 MG chewable tablet Chew 1 tablet (81 mg total) by mouth 2 (two) times daily for 20 days. Then resume one 81 mg aspirin once a day 07/12/23 08/01/23  Edmisten, Kristie L, PA  Cyanocobalamin (B-12 PO) Take 1 tablet by mouth daily.    [provider]  hydrochlorothiazide (HYDRODIURIL) 25 MG tablet Take 1 tablet (25 mg total) by mouth daily. 10/27/22   Loyola Mast, MD  latanoprost (XALATAN) 0.005 % ophthalmic solution Place 1 drop into both eyes at bedtime. 06/20/17   Gordy Savers, MD  MAGNESIUM PO Take 400 mg by mouth at bedtime.    [provider]  methocarbamol (ROBAXIN) 500 MG tablet Take 1 tablet (500 mg total) by mouth every 6 (six) hours as needed for muscle spasms. 07/12/23   Edmisten, Lyn Hollingshead, PA  nitroGLYCERIN (NITROSTAT) 0.4 MG SL tablet Place 1 tablet (0.4 mg total) under the tongue every 5 (five) minutes as needed for chest pain. 07/22/21   Corky Crafts, MD  ondansetron (ZOFRAN) 4 MG tablet Take 1 tablet (4 mg total) by mouth every 6 (six) hours as needed for nausea. 07/12/23   Edmisten, Kristie L, PA  oxyCODONE (OXY IR/ROXICODONE) 5 MG immediate release tablet Take 1-2 tablets (5-10 mg total) by mouth every 8 (eight) hours as needed for severe pain. 07/12/23   Edmisten, Kristie L, PA  pantoprazole (PROTONIX) 40 MG tablet Take  1 tablet by mouth once daily 05/10/23   Corky Crafts, MD  Polyvinyl Alcohol-Povidone (REFRESH OP) Place 1 drop into the left eye 2 (two) times daily as needed (dryness).    [provider]  psyllium (METAMUCIL) 58.6 % powder Take 1 packet by mouth daily.    [provider]  rosuvastatin (CRESTOR) 20 MG tablet Take 1 tablet by mouth once daily 01/24/23   Loyola Mast, MD  tadalafil (CIALIS) 20 MG tablet Take 1 tablet (20 mg total) by mouth daily as needed for erectile  dysfunction. 03/06/20   Philip Aspen, Limmie Patricia, MD  tamsulosin (FLOMAX) 0.4 MG CAPS capsule TAKE 1 CAPSULE BY MOUTH DAILY 05/30/23   Loyola Mast, MD  traMADol (ULTRAM) 50 MG tablet Take 1-2 tablets (50-100 mg total) by mouth every 6 (six) hours as needed for moderate pain. 07/12/23   Edmisten, Lyn Hollingshead, PA  valsartan (DIOVAN) 320 MG tablet Take 1 tablet by mouth once daily 06/27/23   Corky Crafts, MD  zinc gluconate 50 MG tablet Take 50 mg by mouth daily.    [provider]      Allergies    Patient has no known allergies.    Review of Systems   Review of Systems  Physical Exam Updated Vital Signs BP (!) 141/62   Pulse 90   Temp 97.7 F (36.5 C) (Oral)   Resp (!) 24   Ht 5' 4.5" (1.638 m)   Wt 78 kg   SpO2 100%   BMI 29.06 kg/m  Physical Exam Vitals and nursing note reviewed.  Constitutional:      General: He is not in acute distress.    Appearance: He is well-developed. He is ill-appearing. He is not diaphoretic.  HENT:     Head: Normocephalic and atraumatic.  Eyes:     Conjunctiva/sclera: Conjunctivae normal.  Cardiovascular:     Rate and Rhythm: Normal rate and regular rhythm.     Heart sounds: Normal heart sounds. No murmur heard.    No friction rub. No gallop.  Pulmonary:     Effort: Pulmonary effort is normal. Tachypnea present. No respiratory distress.     Breath sounds: Normal breath sounds. No wheezing or rales.  Abdominal:     General: There is distension.     Palpations: Abdomen is soft.     Tenderness: There is abdominal tenderness. There is no guarding.  Musculoskeletal:     Cervical back: Normal range of motion.  Skin:    General: Skin is warm and dry.  Neurological:     Mental Status: He is alert and oriented to person, place, and time.     ED Results / Procedures / Treatments   Labs (all labs ordered are listed, but only abnormal results are displayed) Labs Reviewed  CBC WITH DIFFERENTIAL/PLATELET - Abnormal; Notable for  the following components:      Result Value   WBC 18.9 (*)    Hemoglobin 12.5 (*)    Neutro Abs 17.5 (*)    All other components within normal limits  URINALYSIS, W/ REFLEX TO CULTURE (INFECTION SUSPECTED) - Abnormal; Notable for the following components:   Color, Urine AMBER (*)    Specific Gravity, Urine >1.046 (*)    Bacteria, UA RARE (*)    All other components within normal limits  COMPREHENSIVE METABOLIC PANEL - Abnormal; Notable for the following components:   Sodium 132 (*)    Potassium 3.2 (*)    Chloride 94 (*)  Glucose, Bld 111 (*)    BUN 29 (*)    Calcium 8.1 (*)    Total Protein 6.0 (*)    Total Bilirubin 1.7 (*)    All other components within normal limits  PROTIME-INR - Abnormal; Notable for the following components:   Prothrombin Time 16.4 (*)    INR 1.3 (*)    All other components within normal limits  CORTISOL-AM, BLOOD - Abnormal; Notable for the following components:   Cortisol - AM 24.6 (*)    All other components within normal limits  CBC - Abnormal; Notable for the following components:   WBC 16.8 (*)    RBC 3.51 (*)    Hemoglobin 10.2 (*)    HCT 31.0 (*)    All other components within normal limits  COMPREHENSIVE METABOLIC PANEL - Abnormal; Notable for the following components:   Sodium 130 (*)    Chloride 94 (*)    Glucose, Bld 122 (*)    BUN 26 (*)    Calcium 7.8 (*)    Total Protein 6.0 (*)    Albumin 3.1 (*)    Total Bilirubin 1.4 (*)    All other components within normal limits  LACTIC ACID, PLASMA - Abnormal; Notable for the following components:   Lactic Acid, Venous 2.6 (*)    All other components within normal limits  LACTIC ACID, PLASMA - Abnormal; Notable for the following components:   Lactic Acid, Venous 2.1 (*)    All other components within normal limits  I-STAT CG4 LACTIC ACID, ED - Abnormal; Notable for the following components:   Lactic Acid, Venous 2.4 (*)    All other components within normal limits  I-STAT CG4 LACTIC  ACID, ED - Abnormal; Notable for the following components:   Lactic Acid, Venous 2.5 (*)    All other components within normal limits  SARS CORONAVIRUS 2 BY RT PCR  URINE CULTURE  CULTURE, BLOOD (ROUTINE X 2)  CULTURE, BLOOD (ROUTINE X 2)  PROCALCITONIN  TROPONIN I (HIGH SENSITIVITY)  TROPONIN I (HIGH SENSITIVITY)    EKG EKG Interpretation Date/Time:  Thursday July 14 2023 16:49:44 EDT Ventricular Rate:  88 PR Interval:  157 QRS Duration:  120 QT Interval:  378 QTC Calculation: 458 R Axis:   26  Text Interpretation: Sinus rhythm Nonspecific intraventricular conduction delay Since prior ECG< rate has increased, baseline wander present Confirmed by Alvira Monday ([id number]) on 07/14/2023 4:56:33 PM  Radiology CT Angio Chest PE W and/or Wo Contrast  Result Date: 07/14/2023 CLINICAL DATA:  Recent left knee replacement. Concern for pulmonary embolism and bowel obstruction. EXAM: CT ANGIOGRAPHY CHEST CT ABDOMEN AND PELVIS WITH CONTRAST TECHNIQUE: Multidetector CT imaging of the chest was performed using the standard protocol during bolus administration of intravenous contrast. Multiplanar CT image reconstructions and MIPs were obtained to evaluate the vascular anatomy. Multidetector CT imaging of the abdomen and pelvis was performed using the standard protocol during bolus administration of intravenous contrast. RADIATION DOSE REDUCTION: This exam was performed according to the departmental dose-optimization program which includes automated exposure control, adjustment of the mA and/or kV according to patient size and/or use of iterative reconstruction technique. CONTRAST:  OMNIPAQUE IOHEXOL 350 MG/ML SOLN COMPARISON:  Chest radiograph dated 09/05/2022. FINDINGS: Evaluation of this exam is limited due to respiratory motion artifact. CTA CHEST FINDINGS Cardiovascular: There is no cardiomegaly or pericardial effusion. There is 3 vessel coronary vascular calcification. Mild atherosclerotic  calcification of the thoracic aorta. No aneurysmal dilatation or dissection. The  origins of the great vessels of the aortic arch appear patent. Evaluation of the pulmonary arteries is limited due to severe respiratory motion. No central pulmonary artery embolus identified. Mediastinum/Nodes: No hilar or mediastinal adenopathy. The esophagus is grossly unremarkable. No mediastinal fluid collection. Lungs/Pleura: Bilateral interstitial streaky atelectasis. No focal consolidation, pleural effusion, or pneumothorax. The central airways are patent. Musculoskeletal: No acute osseous pathology. Review of the MIP images confirms the above findings. CT ABDOMEN and PELVIS FINDINGS No intra-abdominal free air.  Trace free fluid in the pelvis. Hepatobiliary: The liver is unremarkable. No biliary dilatation with the gallbladder is unremarkable. Pancreas: Unremarkable. No pancreatic ductal dilatation or surrounding inflammatory changes. Spleen: The spleen is unremarkable. Adrenals/Urinary Tract: The adrenal glands are unremarkable. There is no hydronephrosis on either side. There is symmetric enhancement and excretion of contrast by both kidneys. The visualized ureters and urinary bladder appear unremarkable. Stomach/Bowel: There is severe sigmoid diverticulosis as well as additional scattered colonic diverticula. There is diffuse inflammatory changes of the distal colon extending from the mid transverse colon to the sigmoid colon consistent with colitis. Loose stool throughout the colon consistent with diarrheal state. There is no bowel obstruction. The appendix is normal. Vascular/Lymphatic: Moderate aortoiliac atherosclerotic disease. The IVC is unremarkable. No portal venous gas. There is no adenopathy. Reproductive: Mildly enlarged prostate gland measuring 4.5 cm in transaxial diameter. The seminal vesicles are symmetric. Other: There is edema as well as small pockets of air in the visualized upper left thigh, likely related  to recent surgery. No fluid collection. Musculoskeletal: Bilateral L5 pars defects. There is degenerative changes of the lumbar spine. No acute osseous pathology. Review of the MIP images confirms the above findings. IMPRESSION: 1. No acute intrathoracic pathology. No CT evidence of central pulmonary artery embolus. 2. Colitis of the distal colon. No bowel obstruction. Normal appendix. 3. Severe sigmoid diverticulosis. 4.  Aortic Atherosclerosis (ICD10-I70.0). Electronically Signed   By: Elgie Collard M.D.   On: 07/14/2023 20:11   CT ABDOMEN PELVIS W CONTRAST  Result Date: 07/14/2023 CLINICAL DATA:  Recent left knee replacement. Concern for pulmonary embolism and bowel obstruction. EXAM: CT ANGIOGRAPHY CHEST CT ABDOMEN AND PELVIS WITH CONTRAST TECHNIQUE: Multidetector CT imaging of the chest was performed using the standard protocol during bolus administration of intravenous contrast. Multiplanar CT image reconstructions and MIPs were obtained to evaluate the vascular anatomy. Multidetector CT imaging of the abdomen and pelvis was performed using the standard protocol during bolus administration of intravenous contrast. RADIATION DOSE REDUCTION: This exam was performed according to the departmental dose-optimization program which includes automated exposure control, adjustment of the mA and/or kV according to patient size and/or use of iterative reconstruction technique. CONTRAST:  OMNIPAQUE IOHEXOL 350 MG/ML SOLN COMPARISON:  Chest radiograph dated 09/05/2022. FINDINGS: Evaluation of this exam is limited due to respiratory motion artifact. CTA CHEST FINDINGS Cardiovascular: There is no cardiomegaly or pericardial effusion. There is 3 vessel coronary vascular calcification. Mild atherosclerotic calcification of the thoracic aorta. No aneurysmal dilatation or dissection. The origins of the great vessels of the aortic arch appear patent. Evaluation of the pulmonary arteries is limited due to severe  respiratory motion. No central pulmonary artery embolus identified. Mediastinum/Nodes: No hilar or mediastinal adenopathy. The esophagus is grossly unremarkable. No mediastinal fluid collection. Lungs/Pleura: Bilateral interstitial streaky atelectasis. No focal consolidation, pleural effusion, or pneumothorax. The central airways are patent. Musculoskeletal: No acute osseous pathology. Review of the MIP images confirms the above findings. CT ABDOMEN and PELVIS FINDINGS No intra-abdominal free  air.  Trace free fluid in the pelvis. Hepatobiliary: The liver is unremarkable. No biliary dilatation with the gallbladder is unremarkable. Pancreas: Unremarkable. No pancreatic ductal dilatation or surrounding inflammatory changes. Spleen: The spleen is unremarkable. Adrenals/Urinary Tract: The adrenal glands are unremarkable. There is no hydronephrosis on either side. There is symmetric enhancement and excretion of contrast by both kidneys. The visualized ureters and urinary bladder appear unremarkable. Stomach/Bowel: There is severe sigmoid diverticulosis as well as additional scattered colonic diverticula. There is diffuse inflammatory changes of the distal colon extending from the mid transverse colon to the sigmoid colon consistent with colitis. Loose stool throughout the colon consistent with diarrheal state. There is no bowel obstruction. The appendix is normal. Vascular/Lymphatic: Moderate aortoiliac atherosclerotic disease. The IVC is unremarkable. No portal venous gas. There is no adenopathy. Reproductive: Mildly enlarged prostate gland measuring 4.5 cm in transaxial diameter. The seminal vesicles are symmetric. Other: There is edema as well as small pockets of air in the visualized upper left thigh, likely related to recent surgery. No fluid collection. Musculoskeletal: Bilateral L5 pars defects. There is degenerative changes of the lumbar spine. No acute osseous pathology. Review of the MIP images confirms the  above findings. IMPRESSION: 1. No acute intrathoracic pathology. No CT evidence of central pulmonary artery embolus. 2. Colitis of the distal colon. No bowel obstruction. Normal appendix. 3. Severe sigmoid diverticulosis. 4.  Aortic Atherosclerosis (ICD10-I70.0). Electronically Signed   By: Elgie Collard M.D.   On: 07/14/2023 20:11    Procedures Procedures    Medications Ordered in ED Medications  enoxaparin (LOVENOX) injection 40 mg (has no administration in time range)  lactated ringers infusion ( Intravenous New Bag/Given 07/15/23 0157)  acetaminophen (TYLENOL) tablet 650 mg (has no administration in time range)    Or  acetaminophen (TYLENOL) suppository 650 mg (has no administration in time range)  ondansetron (ZOFRAN) tablet 4 mg ( Oral See Alternative 07/15/23 0240)    Or  ondansetron (ZOFRAN) injection 4 mg (4 mg Intravenous Given 07/15/23 0240)  piperacillin-tazobactam (ZOSYN) IVPB 3.375 g (3.375 g Intravenous New Bag/Given 07/15/23 0438)  vancomycin (VANCOREADY) IVPB 1250 mg/250 mL (has no administration in time range)  HYDROmorphone (DILAUDID) injection 0.5 mg (0.5 mg Intravenous Given 07/15/23 0241)  lactated ringers bolus 500 mL (0 mLs Intravenous Stopped 07/14/23 2108)  ondansetron (ZOFRAN) injection 4 mg (4 mg Intravenous Given 07/14/23 1707)  iohexol (OMNIPAQUE) 350 MG/ML injection 100 mL (100 mLs Intravenous Contrast Given 07/14/23 1849)  lactated ringers bolus 1,000 mL (0 mLs Intravenous Stopped 07/14/23 2309)  lactated ringers bolus 1,000 mL (0 mLs Intravenous Stopped 07/14/23 2345)  piperacillin-tazobactam (ZOSYN) IVPB 3.375 g (0 g Intravenous Stopped 07/14/23 2234)  vancomycin (VANCOREADY) IVPB 1500 mg/300 mL (0 mg Intravenous Stopped 07/15/23 0140)    ED Course/ Medical Decision Making/ A&P                                   82 year old male with a history of hyperlipidemia, hypertension, CHF, coronary disease, nonsustained PE, peptic ulcer disease, recent left total knee  arthroplasty with Dr. Despina Hick on August 12 who presents with concern for nausea and vomiting.  On arrival to the emergency department appears uncomfortable, tachypneic, and was noted to have desaturation down to the 80s.  Differential diagnosis for this includes atelectasis/splinting in the setting of constipation, abdominal distention, but differential also includes pulmonary embolus in the setting of recent surgery, pneumonia.  Differential  diagnosis for abdominal discomfort includes urinary tract infection, ileus, SBO, pancreatitis, ACS.  Labs completed and personally about interpreted by me shows leukocytosis. CMP with mild hypokalemia and hyponatremia, no transaminitis.  Normal troponin, no sign of ACS.  Given his hypoxia, tachypnea, recent knee surgery, CT PE study was ordered and shows no acute intrathoracic pathology, no CT evidence of central pulmonary artery.  CT abdomen pelvis was ordered and shows colitis of the distal colon without bowel obstruction.  No stool on rectal exam. Became hypotensive, ill appearing with blood pressures to 80s and 90s systolic.  Given significant leukocytosis to [address] and hypotension, concern for sepsis, consider secondary to infectious colitis or other etiology.  Mild lactic acidosis consistent with sepsis.  Lower suspicion for ischemic colitis at this time.  Initially given fluids and zosyn with concern for intraabdominal etiology of infection, did add on vancomycin later for sepsis of unknown etiology.  Low suspicion for joint infection given surgery a few days ago, no increased pain or erythema from area around bandage. BP improving with fluids. Will admit for continued care.         Final Clinical Impression(s) / ED Diagnoses Final diagnoses:  Sepsis without acute organ dysfunction, due to unspecified organism [facility])  Constipation, unspecified constipation type  Colitis    Rx / DC Orders ED Discharge Orders     None         Alvira Monday,  MD 07/15/23 [phone]

## 2023-07-14 NOTE — ED Notes (Signed)
Lab called for urine culture collect.

## 2023-07-14 NOTE — Progress Notes (Signed)
A consult was received from an ED physician for Zosyn per pharmacy dosing.  The patient's profile has been reviewed for ht/wt/allergies/indication/available labs.    A one time order has been placed for Zosyn 3.375g IV x1 over 30 minutes.   Further antibiotics/pharmacy consults should be ordered by admitting physician if indicated.                       Thank you,  Lynann Beaver PharmD, BCPS [facility] main pharmacy [phone] 07/14/2023 9:41 PM

## 2023-07-14 NOTE — ED Triage Notes (Signed)
Pt to ER via EMS.  Pt had knee replacement to left knee on Monday.  States no BM since that time, but with urge.  Pt states this AM started with weakness, n/v.  Knee shows no signs of infection, abdomen distended.

## 2023-07-14 NOTE — Assessment & Plan Note (Addendum)
Colitis / maybe post-op ileus / LBO? Given clinical scenario: I'm most suspicious of postoperative ileus following nonabdominal surgery. NPO IVF While it doesn't look like he has anything surgical tonight, I'm gonna put in a secure chat to gen surg at the very least given the ileus suspicion.

## 2023-07-14 NOTE — Assessment & Plan Note (Addendum)
Pt with significant leukocytosis + tachycardia on presentation, development of hypotension while in ED.  BP and tachycardia seem to be fluid responsive. Possible that he could just have SIRS secondary to postop ileus (non infectious)? EDP initiated empiric treatment for sepsis of unknown source / origin with zosyn / vanc Sepsis pathway Will continue the z/v for the moment pending work up: BCx pending Procalcitonin pending Repeat CBC/BMP in AM Regarding source: abd seems to be most suspicious (assuming he has infection at all): Radiologist calling "diarrheal illness" but pt hasn't had bowel movement (at all) in past 3 days Radiologist also calling "colitis" Cecum looks very distended to me, 10cm? Lactate 2.4 UA not really impressive No PNA Wound doesn't appear grossly infected

## 2023-07-14 NOTE — Assessment & Plan Note (Signed)
Hold home BP meds given low BPs in ED, possibly developing sepsis. On another note: why does this patient have BOTH PRN NTG AND PRN Cialis on his med list?

## 2023-07-14 NOTE — Progress Notes (Signed)
A consult was received from an ED physician for Vancomycin per pharmacy dosing.  The patient's profile has been reviewed for ht/wt/allergies/indication/available labs.    A one time order has been placed for Vancomycin 1500mg  IV x1.    Further antibiotics/pharmacy consults should be ordered by admitting physician if indicated.                       Thank you,  Junita Push, PharmD, BCPS 07/14/2023 10:39 PM

## 2023-07-14 NOTE — Sepsis Progress Note (Signed)
Elink monitoring for the code sepsis protocol.   Notified provider of need to order repeat lactic acid.

## 2023-07-15 ENCOUNTER — Observation Stay (HOSPITAL_COMMUNITY): Payer: Medicare Other

## 2023-07-15 DIAGNOSIS — G4733 Obstructive sleep apnea (adult) (pediatric): Secondary | ICD-10-CM | POA: Diagnosis not present

## 2023-07-15 DIAGNOSIS — Z1152 Encounter for screening for COVID-19: Secondary | ICD-10-CM | POA: Diagnosis not present

## 2023-07-15 DIAGNOSIS — R6510 Systemic inflammatory response syndrome (SIRS) of non-infectious origin without acute organ dysfunction: Secondary | ICD-10-CM | POA: Diagnosis not present

## 2023-07-15 DIAGNOSIS — E785 Hyperlipidemia, unspecified: Secondary | ICD-10-CM | POA: Diagnosis present

## 2023-07-15 DIAGNOSIS — I5022 Chronic systolic (congestive) heart failure: Secondary | ICD-10-CM | POA: Diagnosis present

## 2023-07-15 DIAGNOSIS — D649 Anemia, unspecified: Secondary | ICD-10-CM | POA: Diagnosis present

## 2023-07-15 DIAGNOSIS — K567 Ileus, unspecified: Secondary | ICD-10-CM | POA: Diagnosis not present

## 2023-07-15 DIAGNOSIS — K6389 Other specified diseases of intestine: Secondary | ICD-10-CM | POA: Diagnosis not present

## 2023-07-15 DIAGNOSIS — I10 Essential (primary) hypertension: Secondary | ICD-10-CM

## 2023-07-15 DIAGNOSIS — E669 Obesity, unspecified: Secondary | ICD-10-CM | POA: Diagnosis present

## 2023-07-15 DIAGNOSIS — K5909 Other constipation: Secondary | ICD-10-CM | POA: Diagnosis present

## 2023-07-15 DIAGNOSIS — Z8616 Personal history of COVID-19: Secondary | ICD-10-CM | POA: Diagnosis not present

## 2023-07-15 DIAGNOSIS — K9189 Other postprocedural complications and disorders of digestive system: Secondary | ICD-10-CM | POA: Diagnosis not present

## 2023-07-15 DIAGNOSIS — A419 Sepsis, unspecified organism: Secondary | ICD-10-CM | POA: Diagnosis present

## 2023-07-15 DIAGNOSIS — R6520 Severe sepsis without septic shock: Secondary | ICD-10-CM | POA: Diagnosis present

## 2023-07-15 DIAGNOSIS — Y838 Other surgical procedures as the cause of abnormal reaction of the patient, or of later complication, without mention of misadventure at the time of the procedure: Secondary | ICD-10-CM | POA: Diagnosis present

## 2023-07-15 DIAGNOSIS — K56609 Unspecified intestinal obstruction, unspecified as to partial versus complete obstruction: Secondary | ICD-10-CM | POA: Diagnosis not present

## 2023-07-15 DIAGNOSIS — E872 Acidosis, unspecified: Secondary | ICD-10-CM | POA: Diagnosis present

## 2023-07-15 DIAGNOSIS — R112 Nausea with vomiting, unspecified: Secondary | ICD-10-CM | POA: Diagnosis present

## 2023-07-15 DIAGNOSIS — R140 Abdominal distension (gaseous): Secondary | ICD-10-CM | POA: Diagnosis not present

## 2023-07-15 DIAGNOSIS — K560 Paralytic ileus: Secondary | ICD-10-CM | POA: Diagnosis present

## 2023-07-15 DIAGNOSIS — E860 Dehydration: Secondary | ICD-10-CM | POA: Diagnosis present

## 2023-07-15 DIAGNOSIS — H903 Sensorineural hearing loss, bilateral: Secondary | ICD-10-CM | POA: Diagnosis present

## 2023-07-15 DIAGNOSIS — A09 Infectious gastroenteritis and colitis, unspecified: Secondary | ICD-10-CM | POA: Diagnosis present

## 2023-07-15 DIAGNOSIS — I110 Hypertensive heart disease with heart failure: Secondary | ICD-10-CM | POA: Diagnosis present

## 2023-07-15 DIAGNOSIS — M1712 Unilateral primary osteoarthritis, left knee: Secondary | ICD-10-CM | POA: Diagnosis present

## 2023-07-15 DIAGNOSIS — E871 Hypo-osmolality and hyponatremia: Secondary | ICD-10-CM | POA: Diagnosis present

## 2023-07-15 LAB — LACTIC ACID, PLASMA
Lactic Acid, Venous: 2.6 mmol/L (ref 0.5–1.9)
Lactic Acid, Venous: 2.1 mmol/L (ref 0.5–1.9)

## 2023-07-15 LAB — CBC
HCT: 31 % — ABNORMAL LOW (ref 39.0–52.0)
Hemoglobin: 10.2 g/dL — ABNORMAL LOW (ref 13.0–17.0)
MCH: 29.1 pg (ref 26.0–34.0)
MCHC: 32.9 g/dL (ref 30.0–36.0)
MCV: 88.3 fL (ref 80.0–100.0)
Platelets: 250 10*3/uL (ref 150–400)
RBC: 3.51 MIL/uL — ABNORMAL LOW (ref 4.22–5.81)
RDW: 14.5 % (ref 11.5–15.5)
WBC: 16.8 10*3/uL — ABNORMAL HIGH (ref 4.0–10.5)
nRBC: 0 % (ref 0.0–0.2)

## 2023-07-15 LAB — PROTIME-INR
INR: 1.3 — ABNORMAL HIGH (ref 0.8–1.2)
Prothrombin Time: 16.4 s — ABNORMAL HIGH (ref 11.4–15.2)

## 2023-07-15 LAB — COMPREHENSIVE METABOLIC PANEL
ALT: 17 U/L (ref 0–44)
AST: 32 U/L (ref 15–41)
Albumin: 3.1 g/dL — ABNORMAL LOW (ref 3.5–5.0)
Alkaline Phosphatase: 49 U/L (ref 38–126)
Anion gap: 13 (ref 5–15)
BUN: 26 mg/dL — ABNORMAL HIGH (ref 8–23)
CO2: 23 mmol/L (ref 22–32)
Calcium: 7.8 mg/dL — ABNORMAL LOW (ref 8.9–10.3)
Chloride: 94 mmol/L — ABNORMAL LOW (ref 98–111)
Creatinine, Ser: 1.02 mg/dL (ref 0.61–1.24)
GFR, Estimated: >60 mL/min (ref >60)
Glucose, Bld: 122 mg/dL — ABNORMAL HIGH (ref 70–99)
Potassium: 3.6 mmol/L (ref 3.5–5.1)
Sodium: 130 mmol/L — ABNORMAL LOW (ref 135–145)
Total Bilirubin: 1.4 mg/dL — ABNORMAL HIGH (ref 0.3–1.2)
Total Protein: 6 g/dL — ABNORMAL LOW (ref 6.5–8.1)

## 2023-07-15 LAB — PROCALCITONIN: Procalcitonin: 2.06 ng/mL

## 2023-07-15 LAB — CORTISOL-AM, BLOOD: Cortisol - AM: 24.6 ug/dL — ABNORMAL HIGH (ref 6.7–22.6)

## 2023-07-15 MED ORDER — TAMSULOSIN HCL 0.4 MG PO CAPS
0.4000 mg | ORAL_CAPSULE | Freq: Every day | ORAL | Status: DC
  Administered 2023-07-16 – 2023-07-20 (×5): 0.4 mg via ORAL
  Filled 2023-07-15 (×5): qty 1

## 2023-07-15 MED ORDER — PIPERACILLIN-TAZOBACTAM 3.375 G IVPB
3.3750 g | Freq: Three times a day (TID) | INTRAVENOUS | Status: DC
  Administered 2023-07-15 – 2023-07-19 (×14): 3.375 g via INTRAVENOUS
  Filled 2023-07-15 (×14): qty 50

## 2023-07-15 MED ORDER — HYDROMORPHONE HCL 1 MG/ML IJ SOLN
0.5000 mg | INTRAMUSCULAR | Status: DC | PRN
  Administered 2023-07-16 – 2023-07-19 (×7): 0.5 mg via INTRAVENOUS
  Filled 2023-07-15 (×5): qty 1
  Filled 2023-07-15: qty 0.5
  Filled 2023-07-15: qty 1

## 2023-07-15 MED ORDER — ASPIRIN 81 MG PO CHEW: 81.0000 mg | CHEWABLE_TABLET | Freq: Every day | ORAL | Status: DC

## 2023-07-15 MED ORDER — ASPIRIN 81 MG PO CHEW: 81.0000 mg | CHEWABLE_TABLET | Freq: Two times a day (BID) | ORAL | Status: DC

## 2023-07-15 MED ORDER — LACTULOSE 10 GM/15ML PO SOLN
20.0000 g | Freq: Two times a day (BID) | ORAL | Status: DC
  Administered 2023-07-15: 20 g via ORAL
  Filled 2023-07-15: qty 30

## 2023-07-15 MED ORDER — LATANOPROST 0.005 % OP SOLN
1.0000 [drp] | Freq: Every day | OPHTHALMIC | Status: DC
  Administered 2023-07-15 – 2023-07-19 (×5): 1 [drp] via OPHTHALMIC
  Filled 2023-07-15 (×2): qty 2.5

## 2023-07-15 MED ORDER — VANCOMYCIN HCL 1250 MG/250ML IV SOLN
1250.0000 mg | INTRAVENOUS | Status: DC
  Administered 2023-07-16: 1250 mg via INTRAVENOUS
  Filled 2023-07-15: qty 250

## 2023-07-15 MED ORDER — ASPIRIN 81 MG PO CHEW
81.0000 mg | CHEWABLE_TABLET | Freq: Two times a day (BID) | ORAL | Status: DC
  Administered 2023-07-15 – 2023-07-17 (×5): 81 mg via ORAL
  Filled 2023-07-15 (×5): qty 1

## 2023-07-15 MED ORDER — HYDROMORPHONE HCL 1 MG/ML IJ SOLN
0.5000 mg | INTRAMUSCULAR | Status: AC | PRN
  Administered 2023-07-15: 0.5 mg via INTRAVENOUS
  Filled 2023-07-15: qty 1

## 2023-07-15 MED ORDER — BISACODYL 10 MG RE SUPP
10.0000 mg | Freq: Once | RECTAL | Status: AC
  Administered 2023-07-15: 10 mg via RECTAL
  Filled 2023-07-15: qty 1

## 2023-07-15 MED ORDER — METHOCARBAMOL 500 MG PO TABS
500.0000 mg | ORAL_TABLET | Freq: Four times a day (QID) | ORAL | Status: DC | PRN
  Administered 2023-07-16 – 2023-07-18 (×4): 500 mg via ORAL
  Filled 2023-07-15 (×5): qty 1

## 2023-07-15 MED ORDER — KETOROLAC TROMETHAMINE 15 MG/ML IJ SOLN
15.0000 mg | Freq: Four times a day (QID) | INTRAMUSCULAR | Status: AC | PRN
  Administered 2023-07-15 – 2023-07-16 (×2): 15 mg via INTRAVENOUS
  Filled 2023-07-15 (×2): qty 1

## 2023-07-15 MED ORDER — CHLORHEXIDINE GLUCONATE CLOTH 2 % EX PADS
6.0000 | MEDICATED_PAD | Freq: Every day | CUTANEOUS | Status: DC
  Administered 2023-07-15 – 2023-07-20 (×6): 6 via TOPICAL

## 2023-07-15 NOTE — H&P (Signed)
History and Physical    Patient: David Goodman AOZ:[id number] DOB: 03-09-1941 DOA: 07/14/2023 DOS: the patient was seen and examined on 07/15/2023 PCP: Loyola Mast, MD  Patient coming from: Home  Chief Complaint:  Chief Complaint  Patient presents with   Nausea   Emesis   Constipation   HPI: David Goodman is a 82 y.o. male with medical history significant of HLD, HTN, CAD, NSVT.  Pt recently underwent L TKA on Monday this week.  Pt with no BM since time of surgery 3 days ago.  Has had ongoing c/o constipation.  Tried taking laxative today.  Since then has developed nausea and vomiting.  He reports abd discomfort and distention.  No CP.  In ED: Had BP drop in ED as well as WBC of 19k + initial tachycardia.  EDP put him on Sepsis protocol and started empiric ABx.   Review of Systems: As mentioned in the history of present illness. All other systems reviewed and are negative. 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)    HYPERLIPIDEMIA 07/13/2007   HYPERTENSION 07/13/2007   Impaired glucose tolerance 09/27/2016   Iron deficiency anemia due to chronic blood loss 02/27/2021   NEPHROLITHIASIS, 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   NSVT (nonsustained ventricular tachycardia) (HCC)    PEPTIC ULCER DISEASE 07/13/2007   PVC's (premature ventricular contractions)    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   Past Surgical History:  Procedure Laterality Date   BROW LIFT Bilateral 11/10/2020   Procedure: BLEPHAROPLASTY;  Surgeon: Allena Napoleon, MD;   Location: [facility];  Service: Plastics;  Laterality: Bilateral;  1 hour   COLONOSCOPY N/A 04/12/2022   Procedure: COLONOSCOPY;  Surgeon: Willis Modena, MD;  Location: [facility];  Service: Gastroenterology;  Laterality: N/A;   COLONOSCOPY WITH PROPOFOL N/A 02/17/2019   Procedure: COLONOSCOPY WITH PROPOFOL;  Surgeon: [name], MD;  Location: [facility];  Service: Gastroenterology;  Laterality: N/A;   CORONARY STENT INTERVENTION N/A 08/25/2021   Procedure: CORONARY STENT INTERVENTION;  Surgeon: Corky Crafts, MD;  Location: [facility] INVASIVE CV LAB;  Service: Cardiovascular;  Laterality: N/A;   CORONARY ULTRASOUND/IVUS N/A 08/25/2021   Procedure: Intravascular Ultrasound/IVUS;  Surgeon: Corky Crafts, MD;  Location: [facility] INVASIVE CV LAB;  Service: Cardiovascular;  Laterality: N/A;   HYDROCELE EXCISION Right 05/15/2003   IR ANGIOGRAM VISCERAL SELECTIVE  02/16/2019   IR ANGIOGRAM VISCERAL SELECTIVE  02/16/2019   IR ANGIOGRAM VISCERAL SELECTIVE  02/16/2019   IR US GUIDE VASC ACCESS RIGHT  02/16/2019   LEFT HEART CATH AND CORONARY ANGIOGRAPHY N/A 07/28/2021   Procedure: LEFT HEART CATH AND CORONARY ANGIOGRAPHY;  Surgeon: Corky Crafts, MD;  Location: [facility] INVASIVE CV LAB;  Service: Cardiovascular;  Laterality: N/A;   LEFT HEART CATH AND CORONARY ANGIOGRAPHY N/A 08/25/2021   Procedure: LEFT HEART CATH AND CORONARY ANGIOGRAPHY;  Surgeon: Corky Crafts, MD;  Location: [facility] INVASIVE CV LAB;  Service: Cardiovascular;  Laterality: N/A;   PERIPHERAL INTRAVASCULAR LITHOTRIPSY  08/25/2021   Procedure: INTRAVASCULAR LITHOTRIPSY;  Surgeon: Corky Crafts, MD;  Location: [facility] INVASIVE CV LAB;  Service: Cardiovascular;;   ROTATOR CUFF REPAIR     SPERMATOCELECTOMY Right 05/15/2003   TOTAL KNEE ARTHROPLASTY Left 07/11/2023   Procedure: TOTAL KNEE ARTHROPLASTY;  Surgeon: Ollen Gross, MD;  Location: [facility];  Service: Orthopedics;  Laterality: Left;   Social History:  reports  that he has never smoked. He has never used smokeless tobacco. He reports current alcohol use. He reports that he does not use drugs.  No Known Allergies  Family History  Problem Relation Age of Onset   Cancer Brother     Prior to Admission medications   Medication Sig Start Date End Date Taking? Authorizing Provider  Ascorbic Acid (VITAMIN C) 1000 MG tablet Take 1,000 mg by mouth 2 (two) times a week.    [provider]  aspirin 81 MG chewable tablet Chew 1 tablet (81 mg total) by mouth 2 (two) times daily for 20 days. Then resume one 81 mg aspirin once a day 07/12/23 08/01/23  Edmisten, Kristie L, PA  Cyanocobalamin (B-12 PO) Take 1 tablet by mouth daily.    [provider]  hydrochlorothiazide (HYDRODIURIL) 25 MG tablet Take 1 tablet (25 mg total) by mouth daily. 10/27/22   Loyola Mast, MD  latanoprost (XALATAN) 0.005 % ophthalmic solution Place 1 drop into both eyes at bedtime. 06/20/17   Gordy Savers, MD  MAGNESIUM PO Take 400 mg by mouth at bedtime.    [provider]  methocarbamol (ROBAXIN) 500 MG tablet Take 1 tablet (500 mg total) by mouth every 6 (six) hours as needed for muscle spasms. 07/12/23   Edmisten, Lyn Hollingshead, PA  nitroGLYCERIN (NITROSTAT) 0.4 MG SL tablet Place 1 tablet (0.4 mg total) under the tongue every 5 (five) minutes as needed for chest pain. 07/22/21   Corky Crafts, MD  ondansetron (ZOFRAN) 4 MG tablet Take 1 tablet (4 mg total) by mouth every 6 (six) hours as needed for nausea. 07/12/23   Edmisten, Kristie L, PA  oxyCODONE (OXY IR/ROXICODONE) 5 MG immediate release tablet Take 1-2 tablets (5-10 mg total) by mouth every 8 (eight) hours as needed for severe pain. 07/12/23   Edmisten, Lyn Hollingshead, PA  pantoprazole (PROTONIX) 40 MG tablet Take 1 tablet by mouth once daily 05/10/23   Corky Crafts, MD  Polyvinyl Alcohol-Povidone (REFRESH OP) Place 1 drop into the left eye 2 (two) times daily as needed (dryness).    [provider]  psyllium (METAMUCIL) 58.6 % powder Take 1 packet by mouth daily.    [provider]  rosuvastatin (CRESTOR) 20 MG tablet Take 1 tablet by mouth once daily 01/24/23   Loyola Mast, MD  tadalafil (CIALIS) 20 MG tablet Take 1 tablet (20 mg total) by mouth daily as needed for erectile dysfunction. 03/06/20   Philip Aspen, Limmie Patricia, MD  tamsulosin (FLOMAX) 0.4 MG CAPS capsule TAKE 1 CAPSULE BY MOUTH DAILY 05/30/23   Loyola Mast, MD  traMADol (ULTRAM) 50 MG tablet Take 1-2 tablets (50-100 mg total) by mouth every 6 (six) hours as needed for moderate pain. 07/12/23   Edmisten, Lyn Hollingshead, PA  valsartan (DIOVAN) 320 MG tablet Take 1 tablet by mouth once daily 06/27/23   Corky Crafts, MD  zinc gluconate 50 MG tablet Take 50 mg by mouth daily.    [provider]    Physical Exam: Vitals:   07/14/23 2230 07/14/23 2300 07/14/23 2330 07/14/23 2346  BP: (!) 125/58 (!) 105/50 133/75  Pulse: 87 91 90   Resp: (!) 22     Temp:    97.8 F (36.6 C)  TempSrc:    Oral  SpO2: 100% 99% 94%   Weight:      Height:       Constitutional: NAD, calm, comfortable Respiratory: clear to auscultation bilaterally, no wheezing, no crackles. Normal respiratory effort. No accessory muscle use.  Cardiovascular: Regular rate and rhythm, no murmurs / rubs / gallops. No extremity edema. 2+ pedal pulses. No carotid bruits.  Abdomen: Distended, no rebound Musculoskeletal: no clubbing / cyanosis. No joint deformity upper and lower extremities. Good ROM, no contractures. Normal muscle tone.  Skin: No foot ulcer, skin around surgical site dressing without obvious erythema, excessive edema, or other obvious external signs of infection. Neurologic: CN 2-12 grossly intact. Sensation intact, DTR normal. Strength 5/5 in all 4.  Psychiatric: Normal judgment and insight. Alert and oriented x 3. Normal mood.   Data Reviewed:    Labs on Admission: I have personally reviewed following labs  and imaging studies  CBC: Recent Labs  Lab 07/12/23 0341 07/14/23 1637  WBC 15.3* 18.9*  NEUTROABS  --  17.5*  HGB 11.2* 12.5*  HCT 33.4* 39.8  MCV 85.6 93.0  PLT 214 228   Basic Metabolic Panel: Recent Labs  Lab 07/12/23 0341 07/14/23 1840  NA 139 132*  K 3.8 3.2*  CL 106 94*  CO2 22 26  GLUCOSE 121* 111*  BUN 20 29*  CREATININE 0.95 1.11  CALCIUM 8.5* 8.1*   GFR: Estimated Creatinine Clearance: 49.8 mL/min (by C-G formula based on SCr of 1.11 mg/dL). Liver Function Tests: Recent Labs  Lab 07/14/23 1840  AST 25  ALT 18  ALKPHOS 49  BILITOT 1.7*  PROT 6.0*  ALBUMIN 3.5   No results for input(s): "LIPASE", "AMYLASE" in the last 168 hours. No results for input(s): "AMMONIA" in the last 168 hours. Coagulation Profile: No results for input(s): "INR", "PROTIME" in the last 168 hours. Cardiac Enzymes: No results for input(s): "CKTOTAL", "CKMB", "CKMBINDEX", "TROPONINI" in the last 168 hours. BNP (last 3 results) No results for input(s): "PROBNP" in the last 8760 hours. HbA1C: No results for input(s): "HGBA1C" in the last 72 hours. CBG: No results for input(s): "GLUCAP" in the last 168 hours. Lipid Profile: No results for input(s): "CHOL", "HDL", "LDLCALC", "TRIG", "CHOLHDL", "LDLDIRECT" in the last 72 hours. Thyroid Function Tests: No results for input(s): "TSH", "T4TOTAL", "FREET4", "T3FREE", "THYROIDAB" in the last 72 hours. Anemia Panel: No results for input(s): "VITAMINB12", "FOLATE", "FERRITIN", "TIBC", "IRON", "RETICCTPCT" in the last 72 hours. Urine analysis:    Component Value Date/Time   COLORURINE AMBER (A) 07/14/2023 2118   APPEARANCEUR CLEAR 07/14/2023 2118   LABSPEC >1.046 (H) 07/14/2023 2118   PHURINE 5.0 07/14/2023 2118   GLUCOSEU NEGATIVE 07/14/2023 2118   GLUCOSEU NEGATIVE 05/22/2012 0824   HGBUR NEGATIVE 07/14/2023 2118   HGBUR negative 07/09/2009 0852   BILIRUBINUR NEGATIVE 07/14/2023 2118   BILIRUBINUR n 09/21/2016 0925   KETONESUR  NEGATIVE 07/14/2023 2118   PROTEINUR NEGATIVE 07/14/2023 2118   UROBILINOGEN 0.2 09/21/2016 0925   UROBILINOGEN 0.2 05/22/2012 0824   NITRITE NEGATIVE 07/14/2023 2118   LEUKOCYTESUR NEGATIVE 07/14/2023 2118    Radiological Exams on Admission: CT Angio Chest PE W and/or Wo Contrast  Result Date: 07/14/2023 CLINICAL DATA:  Recent left knee replacement. Concern for pulmonary embolism and bowel obstruction. EXAM: CT ANGIOGRAPHY CHEST CT ABDOMEN AND PELVIS WITH CONTRAST TECHNIQUE: Multidetector CT imaging of the  chest was performed using the standard protocol during bolus administration of intravenous contrast. Multiplanar CT image reconstructions and MIPs were obtained to evaluate the vascular anatomy. Multidetector CT imaging of the abdomen and pelvis was performed using the standard protocol during bolus administration of intravenous contrast. RADIATION DOSE REDUCTION: This exam was performed according to the departmental dose-optimization program which includes automated exposure control, adjustment of the mA and/or kV according to patient size and/or use of iterative reconstruction technique. CONTRAST:  OMNIPAQUE IOHEXOL 350 MG/ML SOLN COMPARISON:  Chest radiograph dated 09/05/2022. FINDINGS: Evaluation of this exam is limited due to respiratory motion artifact. CTA CHEST FINDINGS Cardiovascular: There is no cardiomegaly or pericardial effusion. There is 3 vessel coronary vascular calcification. Mild atherosclerotic calcification of the thoracic aorta. No aneurysmal dilatation or dissection. The origins of the great vessels of the aortic arch appear patent. Evaluation of the pulmonary arteries is limited due to severe respiratory motion. No central pulmonary artery embolus identified. Mediastinum/Nodes: No hilar or mediastinal adenopathy. The esophagus is grossly unremarkable. No mediastinal fluid collection. Lungs/Pleura: Bilateral interstitial streaky atelectasis. No focal consolidation, pleural  effusion, or pneumothorax. The central airways are patent. Musculoskeletal: No acute osseous pathology. Review of the MIP images confirms the above findings. CT ABDOMEN and PELVIS FINDINGS No intra-abdominal free air.  Trace free fluid in the pelvis. Hepatobiliary: The liver is unremarkable. No biliary dilatation with the gallbladder is unremarkable. Pancreas: Unremarkable. No pancreatic ductal dilatation or surrounding inflammatory changes. Spleen: The spleen is unremarkable. Adrenals/Urinary Tract: The adrenal glands are unremarkable. There is no hydronephrosis on either side. There is symmetric enhancement and excretion of contrast by both kidneys. The visualized ureters and urinary bladder appear unremarkable. Stomach/Bowel: There is severe sigmoid diverticulosis as well as additional scattered colonic diverticula. There is diffuse inflammatory changes of the distal colon extending from the mid transverse colon to the sigmoid colon consistent with colitis. Loose stool throughout the colon consistent with diarrheal state. There is no bowel obstruction. The appendix is normal. Vascular/Lymphatic: Moderate aortoiliac atherosclerotic disease. The IVC is unremarkable. No portal venous gas. There is no adenopathy. Reproductive: Mildly enlarged prostate gland measuring 4.5 cm in transaxial diameter. The seminal vesicles are symmetric. Other: There is edema as well as small pockets of air in the visualized upper left thigh, likely related to recent surgery. No fluid collection. Musculoskeletal: Bilateral L5 pars defects. There is degenerative changes of the lumbar spine. No acute osseous pathology. Review of the MIP images confirms the above findings. IMPRESSION: 1. No acute intrathoracic pathology. No CT evidence of central pulmonary artery embolus. 2. Colitis of the distal colon. No bowel obstruction. Normal appendix. 3. Severe sigmoid diverticulosis. 4.  Aortic Atherosclerosis (ICD10-I70.0). Electronically Signed    By: Elgie Collard M.D.   On: 07/14/2023 20:11   CT ABDOMEN PELVIS W CONTRAST  Result Date: 07/14/2023 CLINICAL DATA:  Recent left knee replacement. Concern for pulmonary embolism and bowel obstruction. EXAM: CT ANGIOGRAPHY CHEST CT ABDOMEN AND PELVIS WITH CONTRAST TECHNIQUE: Multidetector CT imaging of the chest was performed using the standard protocol during bolus administration of intravenous contrast. Multiplanar CT image reconstructions and MIPs were obtained to evaluate the vascular anatomy. Multidetector CT imaging of the abdomen and pelvis was performed using the standard protocol during bolus administration of intravenous contrast. RADIATION DOSE REDUCTION: This exam was performed according to the departmental dose-optimization program which includes automated exposure control, adjustment of the mA and/or kV according to patient size and/or use of iterative reconstruction technique. CONTRAST:   OMNIPAQUE IOHEXOL 350 MG/ML SOLN COMPARISON:  Chest radiograph dated 09/05/2022. FINDINGS: Evaluation of this exam is limited due to respiratory motion artifact. CTA CHEST FINDINGS Cardiovascular: There is no cardiomegaly or pericardial effusion. There is 3 vessel coronary vascular calcification. Mild atherosclerotic calcification of the thoracic aorta. No aneurysmal dilatation or dissection. The origins of the great vessels of the aortic arch appear patent. Evaluation of the pulmonary arteries is limited due to severe respiratory motion. No central pulmonary artery embolus identified. Mediastinum/Nodes: No hilar or mediastinal adenopathy. The esophagus is grossly unremarkable. No mediastinal fluid collection. Lungs/Pleura: Bilateral interstitial streaky atelectasis. No focal consolidation, pleural effusion, or pneumothorax. The central airways are patent. Musculoskeletal: No acute osseous pathology. Review of the MIP images confirms the above findings. CT ABDOMEN and PELVIS FINDINGS No intra-abdominal  free air.  Trace free fluid in the pelvis. Hepatobiliary: The liver is unremarkable. No biliary dilatation with the gallbladder is unremarkable. Pancreas: Unremarkable. No pancreatic ductal dilatation or surrounding inflammatory changes. Spleen: The spleen is unremarkable. Adrenals/Urinary Tract: The adrenal glands are unremarkable. There is no hydronephrosis on either side. There is symmetric enhancement and excretion of contrast by both kidneys. The visualized ureters and urinary bladder appear unremarkable. Stomach/Bowel: There is severe sigmoid diverticulosis as well as additional scattered colonic diverticula. There is diffuse inflammatory changes of the distal colon extending from the mid transverse colon to the sigmoid colon consistent with colitis. Loose stool throughout the colon consistent with diarrheal state. There is no bowel obstruction. The appendix is normal. Vascular/Lymphatic: Moderate aortoiliac atherosclerotic disease. The IVC is unremarkable. No portal venous gas. There is no adenopathy. Reproductive: Mildly enlarged prostate gland measuring 4.5 cm in transaxial diameter. The seminal vesicles are symmetric. Other: There is edema as well as small pockets of air in the visualized upper left thigh, likely related to recent surgery. No fluid collection. Musculoskeletal: Bilateral L5 pars defects. There is degenerative changes of the lumbar spine. No acute osseous pathology. Review of the MIP images confirms the above findings. IMPRESSION: 1. No acute intrathoracic pathology. No CT evidence of central pulmonary artery embolus. 2. Colitis of the distal colon. No bowel obstruction. Normal appendix. 3. Severe sigmoid diverticulosis. 4.  Aortic Atherosclerosis (ICD10-I70.0). Electronically Signed   By: Elgie Collard M.D.   On: 07/14/2023 20:11    EKG: Independently reviewed.   Assessment and Plan: * SIRS (systemic inflammatory response syndrome) (HCC) Pt with significant leukocytosis +  tachycardia on presentation, development of hypotension while in ED.  BP and tachycardia seem to be fluid responsive. Possible that he could just have SIRS secondary to postop ileus (non infectious)? EDP initiated empiric treatment for sepsis of unknown source / origin with zosyn / vanc Sepsis pathway Will continue the z/v for the moment pending work up: BCx pending Procalcitonin pending Repeat CBC/BMP in AM Regarding source: abd seems to be most suspicious (assuming he has infection at all): Radiologist calling "diarrheal illness" but pt hasn't had bowel movement (at all) in past 3 days Radiologist also calling "colitis" Cecum looks very distended to me, 10cm? Lactate 2.4 UA not really impressive No PNA Wound doesn't appear grossly infected  Postoperative ileus (HCC) Colitis / maybe post-op ileus / LBO? Given clinical scenario: I'm most suspicious of postoperative ileus following nonabdominal surgery. NPO IVF While it doesn't look like he has anything surgical tonight, I'm gonna put in a secure chat to gen surg at the very least given the ileus suspicion.  OSA on CPAP CPAP QHS  Essential hypertension Hold home  BP meds given low BPs in ED, possibly developing sepsis. On another note: why does this patient have BOTH PRN NTG AND PRN Cialis on his med list?      Advance Care Planning:   Code Status: Full Code  Consults: Secure chat sent to gen surg given suspicion for postop ileus.  Family Communication: No family in room  Severity of Illness: The appropriate patient status for this patient is OBSERVATION. Observation status is judged to be reasonable and necessary in order to provide the required intensity of service to ensure the patient's safety. The patient's presenting symptoms, physical exam findings, and initial radiographic and laboratory data in the context of their medical condition is felt to place them at decreased risk for further clinical deterioration.  Furthermore, it is anticipated that the patient will be medically stable for discharge from the hospital within 2 midnights of admission.   Author: Hillary Bow., DO 07/15/2023 12:32 AM  For on call review [email or web address].

## 2023-07-15 NOTE — Progress Notes (Signed)
   07/15/23 2351  BiPAP/CPAP/SIPAP  Reason BIPAP/CPAP not in use Non-compliant (PT states he does not use cpap)

## 2023-07-15 NOTE — Assessment & Plan Note (Signed)
CPAP QHS

## 2023-07-15 NOTE — ED Notes (Signed)
ED TO INPATIENT HANDOFF REPORT  Name/Age/Gender David Goodman 82 y.o. male  Code Status    Code Status Orders  (From admission, onward)           Start     Ordered   07/14/23 2350  Full code  Continuous       Question:  By:  Answer:  Default: patient does not have capacity for decision making, no surrogate or prior directive available   07/14/23 2352           Code Status History     Date Active Date Inactive Code Status Order ID Comments User Context   07/11/2023 1105 07/12/2023 2144 Full Code [id number]  Derenda Fennel, [place] Inpatient   04/11/2022 1606 04/16/2022 2125 Full Code [id number]  Teddy Spike, DO Inpatient   08/25/2021 0945 08/25/2021 2102 Full Code [id number]  Corky Crafts, MD Inpatient   07/28/2021 1119 07/28/2021 1829 Full Code [id number]  Corky Crafts, MD Inpatient   07/16/2020 1440 07/18/2020 2109 Full Code [id number]  Almon Hercules, MD ED   02/13/2019 2050 02/18/2019 1326 Full Code [id number]  Eduard Clos, MD Inpatient   05/24/2018 1646 05/28/2018 1511 Full Code [id number]  Lenox Ponds, MD Inpatient      Advance Directive Documentation    Flowsheet Row Most Recent Value  Type of Advance Directive Living will  Pre-existing out of facility DNR order (yellow form or pink MOST form) --  "MOST" Form in Place? --       Home/SNF/Other Home  Chief Complaint SIRS (systemic inflammatory response syndrome) (HCC) [R65.10]  Level of Care/Admitting Diagnosis ED Disposition     ED Disposition  Admit   Condition  --   Comment  Hospital Area: [facility] [[id number]]  Level of Care: Stepdown [14]  Admit to SDU based on following criteria: Hemodynamic compromise or significant risk of instability:  Patient requiring short term acute titration and management of vasoactive drips, and invasive monitoring (i.e., CVP and Arterial line).  May place patient in observation at [facility] or [facility] if equivalent level of  care is available:: No  Covid Evaluation: Asymptomatic - no recent exposure (last 10 days) testing not required  Diagnosis: SIRS (systemic inflammatory response syndrome) [facility]) [[id number]]  Admitting Physician: Hillary Bow [phone]  Attending Physician: Hillary Bow [phone]          Medical History 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)    HYPERLIPIDEMIA 07/13/2007   HYPERTENSION 07/13/2007   Impaired glucose tolerance 09/27/2016   Iron deficiency anemia due to chronic blood loss 02/27/2021   NEPHROLITHIASIS, 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   NSVT (nonsustained ventricular tachycardia) (HCC)    PEPTIC ULCER DISEASE 07/13/2007   PVC's (premature ventricular contractions)    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    Allergies No Known Allergies  IV Location/Drains/Wounds Patient Lines/Drains/Airways Status     Active Line/Drains/Airways     Name Placement date Placement time Site Days   Peripheral IV 07/14/23 22 G Left;Posterior Hand  07/14/23  1612  Hand  1   Peripheral IV 07/14/23 20 G 1.25" Anterior;Left;Proximal Forearm 07/14/23  1838  Forearm  1            Labs/Imaging Results for orders placed or performed during the hospital encounter of 07/14/23 (from the past 48 hour(s))  CBC with Differential     Status: Abnormal   Collection Time: 07/14/23  4:37 PM  Result Value Ref Range   WBC 18.9 (H) 4.0 - 10.5 K/uL   RBC 4.28 4.22 - 5.81 MIL/uL   Hemoglobin 12.5 (L) 13.0 - 17.0 g/dL   HCT 16.1 09.6 - 04.5 %   MCV 93.0  80.0 - 100.0 fL   MCH 29.2 26.0 - 34.0 pg   MCHC 31.4 30.0 - 36.0 g/dL   RDW 40.9 81.1 - 91.4 %   Platelets 228 150 - 400 K/uL    Comment: SPECIMEN CHECKED FOR CLOTS REPEATED TO VERIFY    nRBC 0.0 0.0 - 0.2 %   Neutrophils Relative % 93 %   Neutro Abs 17.5 (H) 1.7 - 7.7 K/uL   Lymphocytes Relative 4 %   Lymphs Abs 0.7 0.7 - 4.0 K/uL   Monocytes Relative 3 %   Monocytes Absolute 0.6 0.1 - 1.0 K/uL   Eosinophils Relative 0 %   Eosinophils Absolute 0.0 0.0 - 0.5 K/uL   Basophils Relative 0 %   Basophils Absolute 0.0 0.0 - 0.1 K/uL   Immature Granulocytes 0 %   Abs Immature Granulocytes 0.07 0.00 - 0.07 K/uL    Comment: Performed at [facility], [address], [place], [place] [address]  SARS Coronavirus 2 by RT PCR (hospital order, performed in [facility] hospital lab) *cepheid single result test* Anterior Nasal Swab     Status: None   Collection Time: 07/14/23  4:37 PM   Specimen: Anterior Nasal Swab  Result Value Ref Range   SARS Coronavirus 2 by RT PCR NEGATIVE NEGATIVE    Comment: (NOTE) SARS-CoV-2 target nucleic acids are NOT DETECTED.  The SARS-CoV-2 RNA is generally detectable in upper and lower respiratory specimens during the acute phase of infection. The lowest concentration of SARS-CoV-2 viral copies this assay can detect is 250 copies / mL. A negative result does not preclude SARS-CoV-2 infection and should not be used as the sole basis for treatment or other patient management decisions.  A negative result may occur with improper specimen collection / handling, submission of specimen other than nasopharyngeal swab, presence of viral mutation(s) within the areas targeted by this assay, and inadequate number of viral copies (<250 copies / mL). A negative result must be combined with clinical observations, patient history, and epidemiological information.  Fact Sheet for Patients:   RoadLapTop.co.za  Fact Sheet for  Healthcare Providers: http://kim-miller.com/  This test is not yet approved or  cleared by the [place] FDA and has been authorized for detection and/or diagnosis of SARS-CoV-2 by FDA under an Emergency Use Authorization (EUA).  This EUA will remain in effect (meaning this test can be used) for the duration of the COVID-19 declaration under Section 564(b)(1) of the Act, 21 U.S.C. section 360bbb-3(b)(1), unless the authorization is terminated or revoked sooner.  Performed at [facility], [address], [place], [place] [address]   Comprehensive metabolic panel     Status: Abnormal   Collection Time: 07/14/23  6:40 PM  Result Value Ref Range   Sodium 132 (L) 135 - 145 mmol/L  Comment: DELTA CHECK NOTED   Potassium 3.2 (L) 3.5 - 5.1 mmol/L   Chloride 94 (L) 98 - 111 mmol/L   CO2 26 22 - 32 mmol/L   Glucose, Bld 111 (H) 70 - 99 mg/dL    Comment: Glucose reference range applies only to samples taken after fasting for at least 8 hours.   BUN 29 (H) 8 - 23 mg/dL   Creatinine, Ser 2.95 0.61 - 1.24 mg/dL   Calcium 8.1 (L) 8.9 - 10.3 mg/dL   Total Protein 6.0 (L) 6.5 - 8.1 g/dL   Albumin 3.5 3.5 - 5.0 g/dL   AST 25 15 - 41 U/L   ALT 18 0 - 44 U/L   Alkaline Phosphatase 49 38 - 126 U/L   Total Bilirubin 1.7 (H) 0.3 - 1.2 mg/dL   GFR, Estimated >62 >13 mL/min    Comment: (NOTE) Calculated using the CKD-EPI Creatinine Equation (2021)    Anion gap 12 5 - 15    Comment: Performed at [facility], [address] [place], [place] [address]  Troponin I (High Sensitivity)     Status: None   Collection Time: 07/14/23  6:40 PM  Result Value Ref Range   Troponin I (High Sensitivity) 12 <18 ng/L    Comment: (NOTE) Elevated high sensitivity troponin I (hsTnI) values and significant  changes across serial measurements may suggest ACS but many other  chronic and acute conditions are known to elevate hsTnI results.  Refer to the  "Links" section for chest pain algorithms and additional  guidance. Performed at [facility], [address], [place], [place] [address]   Urinalysis, w/ Reflex to Culture (Infection Suspected) -Urine, Clean Catch     Status: Abnormal   Collection Time: 07/14/23  9:18 PM  Result Value Ref Range   Specimen Source URINE, CLEAN CATCH    Color, Urine AMBER (A) YELLOW    Comment: BIOCHEMICALS MAY BE AFFECTED BY COLOR   APPearance CLEAR CLEAR   Specific Gravity, Urine >1.046 (H) 1.005 - 1.030   pH 5.0 5.0 - 8.0   Glucose, UA NEGATIVE NEGATIVE mg/dL   Hgb urine dipstick NEGATIVE NEGATIVE   Bilirubin Urine NEGATIVE NEGATIVE   Ketones, ur NEGATIVE NEGATIVE mg/dL   Protein, ur NEGATIVE NEGATIVE mg/dL   Nitrite NEGATIVE NEGATIVE   Leukocytes,Ua NEGATIVE NEGATIVE   RBC / HPF 0-5 0 - 5 RBC/hpf   WBC, UA 0-5 0 - 5 WBC/hpf    Comment:        Reflex urine culture not performed if WBC <=10, OR if Squamous epithelial cells >5. If Squamous epithelial cells >5 suggest recollection.    Bacteria, UA RARE (A) NONE SEEN   Squamous Epithelial / HPF 0-5 0 - 5 /HPF    Comment: Performed at [facility], [address] [place], [place] [address]  Troponin I (High Sensitivity)     Status: None   Collection Time: 07/14/23  9:18 PM  Result Value Ref Range   Troponin I (High Sensitivity) 17 <18 ng/L    Comment: (NOTE) Elevated high sensitivity troponin I (hsTnI) values and significant  changes across serial measurements may suggest ACS but many other  chronic and acute conditions are known to elevate hsTnI results.  Refer to the "Links" section for chest pain algorithms and additional  guidance. Performed at [facility], [address] [place], [place] [address]   Blood culture (routine x 2)     Status: None (  Preliminary result)   Collection Time: 07/14/23  9:47 PM   Specimen: BLOOD  Result Value Ref Range   Specimen Description       BLOOD BLOOD RIGHT FOREARM Performed at [facility], [address] [place], [place] [address]    Special Requests      BOTTLES DRAWN AEROBIC AND ANAEROBIC Blood Culture adequate volume Performed at [facility], [address] [place], [place] [address]    Culture      NO GROWTH < 12 HOURS Performed at [facility], [address] [place], [place] [address]    Report Status PENDING   Blood culture (routine x 2)     Status: None (Preliminary result)   Collection Time: 07/14/23  9:51 PM   Specimen: BLOOD  Result Value Ref Range   Specimen Description      BLOOD BLOOD RIGHT FOREARM Performed at [facility], [address], [place], [place] [address]    Special Requests      BOTTLES DRAWN AEROBIC AND ANAEROBIC Blood Culture adequate volume Performed at [facility], [address] [place], [place] [address]    Culture      NO GROWTH < 12 HOURS Performed at [facility], [address] [place], [place] [address]    Report Status PENDING   I-Stat CG4 Lactic Acid     Status: Abnormal   Collection Time: 07/14/23 10:03 PM  Result Value Ref Range   Lactic Acid, Venous 2.4 (HH) 0.5 - 1.9 mmol/L   Comment NOTIFIED PHYSICIAN   I-Stat CG4 Lactic Acid     Status: Abnormal   Collection Time: 07/14/23 11:55 PM  Result Value Ref Range   Lactic Acid, Venous 2.5 (HH) 0.5 - 1.9 mmol/L   Comment NOTIFIED PHYSICIAN   Lactic acid, plasma     Status: Abnormal   Collection Time: 07/15/23  1:58 AM  Result Value Ref Range   Lactic Acid, Venous 2.6 (HH) 0.5 - 1.9 mmol/L    Comment: CRITICAL RESULT CALLED TO, READ BACK BY AND VERIFIED WITH LACIVITA,H RN @ 0225 07/15/23 BY CHILDRESS,E Performed at [facility], [address] [place], [place] [address]   Protime-INR     Status: Abnormal   Collection Time: 07/15/23  4:42 AM  Result Value Ref Range   Prothrombin Time 16.4 (H) 11.4 - 15.2  seconds   INR 1.3 (H) 0.8 - 1.2    Comment: (NOTE) INR goal varies based on device and disease states. Performed at [facility], [address] [place], [place] [address]   Cortisol-am, blood     Status: Abnormal   Collection Time: 07/15/23  4:42 AM  Result Value Ref Range   Cortisol - AM 24.6 (H) 6.7 - 22.6 ug/dL    Comment: Performed at [facility], [address], [place], [place] [address]  Procalcitonin     Status: None   Collection Time: 07/15/23  4:42 AM  Result Value Ref Range   Procalcitonin 2.06 ng/mL    Comment:        Interpretation: PCT > 2 ng/mL: Systemic infection (sepsis) is likely, unless other causes are known. (NOTE)       Sepsis PCT Algorithm           Lower Respiratory Tract  Infection PCT Algorithm    ----------------------------     ----------------------------         PCT < 0.25 ng/mL                PCT < 0.10 ng/mL          Strongly encourage             Strongly discourage   discontinuation of antibiotics    initiation of antibiotics    ----------------------------     -----------------------------       PCT 0.25 - 0.50 ng/mL            PCT 0.10 - 0.25 ng/mL               OR       >80% decrease in PCT            Discourage initiation of                                            antibiotics      Encourage discontinuation           of antibiotics    ----------------------------     -----------------------------         PCT >= 0.50 ng/mL              PCT 0.26 - 0.50 ng/mL               AND       <80% decrease in PCT              Encourage initiation of                                             antibiotics       Encourage continuation           of antibiotics    ----------------------------     -----------------------------        PCT >= 0.50 ng/mL                  PCT > 0.50 ng/mL               AND         increase in PCT                  Strongly encourage                                       initiation of antibiotics    Strongly encourage escalation           of antibiotics                                     -----------------------------                                           PCT <= 0.25 ng/mL  OR                                        > 80% decrease in PCT                                      Discontinue / Do not initiate                                             antibiotics  Performed at [facility], [address], [place], [place] [address]   CBC     Status: Abnormal   Collection Time: 07/15/23  4:42 AM  Result Value Ref Range   WBC 16.8 (H) 4.0 - 10.5 K/uL   RBC 3.51 (L) 4.22 - 5.81 MIL/uL   Hemoglobin 10.2 (L) 13.0 - 17.0 g/dL   HCT 30.8 (L) 65.7 - 84.6 %   MCV 88.3 80.0 - 100.0 fL   MCH 29.1 26.0 - 34.0 pg   MCHC 32.9 30.0 - 36.0 g/dL   RDW 96.2 95.2 - 84.1 %   Platelets 250 150 - 400 K/uL   nRBC 0.0 0.0 - 0.2 %    Comment: Performed at [facility], [address], [place], [place] [address]  Comprehensive metabolic panel     Status: Abnormal   Collection Time: 07/15/23  4:42 AM  Result Value Ref Range   Sodium 130 (L) 135 - 145 mmol/L   Potassium 3.6 3.5 - 5.1 mmol/L   Chloride 94 (L) 98 - 111 mmol/L   CO2 23 22 - 32 mmol/L   Glucose, Bld 122 (H) 70 - 99 mg/dL    Comment: Glucose reference range applies only to samples taken after fasting for at least 8 hours.   BUN 26 (H) 8 - 23 mg/dL   Creatinine, Ser 1.02 0.61 - 1.24 mg/dL   Calcium 7.8 (L) 8.9 - 10.3 mg/dL   Total Protein 6.0 (L) 6.5 - 8.1 g/dL   Albumin 3.1 (L) 3.5 - 5.0 g/dL   AST 32 15 - 41 U/L   ALT 17 0 - 44 U/L   Alkaline Phosphatase 49 38 - 126 U/L   Total Bilirubin 1.4 (H) 0.3 - 1.2 mg/dL   GFR, Estimated >72 >53 mL/min    Comment: (NOTE) Calculated using the CKD-EPI Creatinine Equation (2021)    Anion gap 13 5 - 15    Comment: Performed at [facility], [address], [place], [place] [address]  Lactic acid, plasma     Status: Abnormal   Collection Time: 07/15/23  4:42 AM  Result Value Ref Range   Lactic Acid, Venous 2.1 (HH) 0.5 - 1.9 mmol/L    Comment: CRITICAL VALUE NOTED. VALUE IS CONSISTENT WITH PREVIOUSLY REPORTED/CALLED VALUE Performed at [facility], [address], [place], [place] [address]    CT Angio Chest PE W and/or Wo Contrast  Result Date: 07/14/2023 CLINICAL DATA:  Recent left knee replacement. Concern for pulmonary embolism and bowel obstruction. EXAM: CT ANGIOGRAPHY CHEST CT ABDOMEN AND PELVIS WITH CONTRAST TECHNIQUE: Multidetector CT imaging of the chest was performed using the standard protocol during bolus administration of intravenous  contrast. Multiplanar CT image reconstructions and MIPs were obtained to evaluate the vascular anatomy. Multidetector CT imaging of the abdomen and pelvis was performed using the standard protocol during bolus administration of intravenous contrast. RADIATION DOSE REDUCTION: This exam was performed according to the departmental dose-optimization program which includes automated exposure control, adjustment of the mA and/or kV according to patient size and/or use of iterative reconstruction technique. CONTRAST:  OMNIPAQUE IOHEXOL 350 MG/ML SOLN COMPARISON:  Chest radiograph dated 09/05/2022. FINDINGS: Evaluation of this exam is limited due to respiratory motion artifact. CTA CHEST FINDINGS Cardiovascular: There is no cardiomegaly or pericardial effusion. There is 3 vessel coronary vascular calcification. Mild atherosclerotic calcification of the thoracic aorta. No aneurysmal dilatation or dissection. The origins of the great vessels of the aortic arch appear patent. Evaluation of the pulmonary arteries is limited due to severe respiratory motion. No central pulmonary artery embolus identified. Mediastinum/Nodes: No hilar or mediastinal adenopathy. The esophagus is grossly  unremarkable. No mediastinal fluid collection. Lungs/Pleura: Bilateral interstitial streaky atelectasis. No focal consolidation, pleural effusion, or pneumothorax. The central airways are patent. Musculoskeletal: No acute osseous pathology. Review of the MIP images confirms the above findings. CT ABDOMEN and PELVIS FINDINGS No intra-abdominal free air.  Trace free fluid in the pelvis. Hepatobiliary: The liver is unremarkable. No biliary dilatation with the gallbladder is unremarkable. Pancreas: Unremarkable. No pancreatic ductal dilatation or surrounding inflammatory changes. Spleen: The spleen is unremarkable. Adrenals/Urinary Tract: The adrenal glands are unremarkable. There is no hydronephrosis on either side. There is symmetric enhancement and excretion of contrast by both kidneys. The visualized ureters and urinary bladder appear unremarkable. Stomach/Bowel: There is severe sigmoid diverticulosis as well as additional scattered colonic diverticula. There is diffuse inflammatory changes of the distal colon extending from the mid transverse colon to the sigmoid colon consistent with colitis. Loose stool throughout the colon consistent with diarrheal state. There is no bowel obstruction. The appendix is normal. Vascular/Lymphatic: Moderate aortoiliac atherosclerotic disease. The IVC is unremarkable. No portal venous gas. There is no adenopathy. Reproductive: Mildly enlarged prostate gland measuring 4.5 cm in transaxial diameter. The seminal vesicles are symmetric. Other: There is edema as well as small pockets of air in the visualized upper left thigh, likely related to recent surgery. No fluid collection. Musculoskeletal: Bilateral L5 pars defects. There is degenerative changes of the lumbar spine. No acute osseous pathology. Review of the MIP images confirms the above findings. IMPRESSION: 1. No acute intrathoracic pathology. No CT evidence of central pulmonary artery embolus. 2. Colitis of the distal colon. No  bowel obstruction. Normal appendix. 3. Severe sigmoid diverticulosis. 4.  Aortic Atherosclerosis (ICD10-I70.0). Electronically Signed   By: Elgie Collard M.D.   On: 07/14/2023 20:11   CT ABDOMEN PELVIS W CONTRAST  Result Date: 07/14/2023 CLINICAL DATA:  Recent left knee replacement. Concern for pulmonary embolism and bowel obstruction. EXAM: CT ANGIOGRAPHY CHEST CT ABDOMEN AND PELVIS WITH CONTRAST TECHNIQUE: Multidetector CT imaging of the chest was performed using the standard protocol during bolus administration of intravenous contrast. Multiplanar CT image reconstructions and MIPs were obtained to evaluate the vascular anatomy. Multidetector CT imaging of the abdomen and pelvis was performed using the standard protocol during bolus administration of intravenous contrast. RADIATION DOSE REDUCTION: This exam was performed according to the departmental dose-optimization program which includes automated exposure control, adjustment of the mA and/or kV according to patient size and/or use of iterative reconstruction technique. CONTRAST:  OMNIPAQUE IOHEXOL 350 MG/ML SOLN COMPARISON:  Chest radiograph dated 09/05/2022. FINDINGS:  Evaluation of this exam is limited due to respiratory motion artifact. CTA CHEST FINDINGS Cardiovascular: There is no cardiomegaly or pericardial effusion. There is 3 vessel coronary vascular calcification. Mild atherosclerotic calcification of the thoracic aorta. No aneurysmal dilatation or dissection. The origins of the great vessels of the aortic arch appear patent. Evaluation of the pulmonary arteries is limited due to severe respiratory motion. No central pulmonary artery embolus identified. Mediastinum/Nodes: No hilar or mediastinal adenopathy. The esophagus is grossly unremarkable. No mediastinal fluid collection. Lungs/Pleura: Bilateral interstitial streaky atelectasis. No focal consolidation, pleural effusion, or pneumothorax. The central airways are patent.  Musculoskeletal: No acute osseous pathology. Review of the MIP images confirms the above findings. CT ABDOMEN and PELVIS FINDINGS No intra-abdominal free air.  Trace free fluid in the pelvis. Hepatobiliary: The liver is unremarkable. No biliary dilatation with the gallbladder is unremarkable. Pancreas: Unremarkable. No pancreatic ductal dilatation or surrounding inflammatory changes. Spleen: The spleen is unremarkable. Adrenals/Urinary Tract: The adrenal glands are unremarkable. There is no hydronephrosis on either side. There is symmetric enhancement and excretion of contrast by both kidneys. The visualized ureters and urinary bladder appear unremarkable. Stomach/Bowel: There is severe sigmoid diverticulosis as well as additional scattered colonic diverticula. There is diffuse inflammatory changes of the distal colon extending from the mid transverse colon to the sigmoid colon consistent with colitis. Loose stool throughout the colon consistent with diarrheal state. There is no bowel obstruction. The appendix is normal. Vascular/Lymphatic: Moderate aortoiliac atherosclerotic disease. The IVC is unremarkable. No portal venous gas. There is no adenopathy. Reproductive: Mildly enlarged prostate gland measuring 4.5 cm in transaxial diameter. The seminal vesicles are symmetric. Other: There is edema as well as small pockets of air in the visualized upper left thigh, likely related to recent surgery. No fluid collection. Musculoskeletal: Bilateral L5 pars defects. There is degenerative changes of the lumbar spine. No acute osseous pathology. Review of the MIP images confirms the above findings. IMPRESSION: 1. No acute intrathoracic pathology. No CT evidence of central pulmonary artery embolus. 2. Colitis of the distal colon. No bowel obstruction. Normal appendix. 3. Severe sigmoid diverticulosis. 4.  Aortic Atherosclerosis (ICD10-I70.0). Electronically Signed   By: Elgie Collard M.D.   On: 07/14/2023 20:11     Pending Labs Unresulted Labs (From admission, onward)     Start     Ordered   07/16/23 0500  Creatinine, serum  Daily,   R     Comments: Check daily while on Vanc/Zosyn combo to prevent AKI    07/15/23 0014   07/14/23 2131  Urine Culture  Once,   URGENT       Question:  Indication  Answer:  Dysuria   07/14/23 2131            Vitals/Pain Today's Vitals   07/15/23 0445 07/15/23 0545 07/15/23 0622 07/15/23 0630  BP:  (!) 147/68  (!) 141/62  Pulse:  85 90   Resp:  (!) 26 (!) 23 (!) 24  Temp: 97.7 F (36.5 C)     TempSrc: Oral     SpO2:  98% 100%   Weight:      Height:      PainSc:        Isolation Precautions No active isolations  Medications Medications  enoxaparin (LOVENOX) injection 40 mg (has no administration in time range)  lactated ringers infusion ( Intravenous New Bag/Given 07/15/23 0157)  acetaminophen (TYLENOL) tablet 650 mg (has no administration in time range)    Or  acetaminophen (TYLENOL) suppository  650 mg (has no administration in time range)  ondansetron (ZOFRAN) tablet 4 mg ( Oral See Alternative 07/15/23 0240)    Or  ondansetron (ZOFRAN) injection 4 mg (4 mg Intravenous Given 07/15/23 0240)  piperacillin-tazobactam (ZOSYN) IVPB 3.375 g (3.375 g Intravenous New Bag/Given 07/15/23 0438)  vancomycin (VANCOREADY) IVPB 1250 mg/250 mL (has no administration in time range)  HYDROmorphone (DILAUDID) injection 0.5 mg (0.5 mg Intravenous Given 07/15/23 0241)  lactated ringers bolus 500 mL (0 mLs Intravenous Stopped 07/14/23 2108)  ondansetron (ZOFRAN) injection 4 mg (4 mg Intravenous Given 07/14/23 1707)  iohexol (OMNIPAQUE) 350 MG/ML injection 100 mL (100 mLs Intravenous Contrast Given 07/14/23 1849)  lactated ringers bolus 1,000 mL (0 mLs Intravenous Stopped 07/14/23 2309)  lactated ringers bolus 1,000 mL (0 mLs Intravenous Stopped 07/14/23 2345)  piperacillin-tazobactam (ZOSYN) IVPB 3.375 g (0 g Intravenous Stopped 07/14/23 2234)  vancomycin (VANCOREADY) IVPB  1500 mg/300 mL (0 mg Intravenous Stopped 07/15/23 0140)    Mobility walks

## 2023-07-15 NOTE — Progress Notes (Addendum)
Spoke with patient regarding cpap.  Pt stated that he quit wearing his cpap at home and does not want to wear it while here in the hospital.  Pt was advised that RT is available all night should he change his mind.  RN aware.

## 2023-07-15 NOTE — Plan of Care (Signed)
  Problem: Education: Goal: Knowledge of the prescribed therapeutic regimen will improve Outcome: Progressing   Problem: Activity: Goal: Ability to avoid complications of mobility impairment will improve Outcome: Progressing   Problem: Clinical Measurements: Goal: Postoperative complications will be avoided or minimized Outcome: Progressing   Problem: Education: Goal: Knowledge of General Education information will improve Description: Including pain rating scale, medication(s)/side effects and non-pharmacologic comfort measures Outcome: Progressing   Problem: Health Behavior/Discharge Planning: Goal: Ability to manage health-related needs will improve Outcome: Progressing   Problem: Coping: Goal: Level of anxiety will decrease Outcome: Progressing   Problem: Safety: Goal: Ability to remain free from injury will improve Outcome: Progressing

## 2023-07-15 NOTE — Progress Notes (Signed)
  PROGRESS NOTE    David Goodman  YNW:[id number] DOB: May 20, 1941 DOA: 07/14/2023 PCP: Loyola Mast, MD    Brief Narrative:  82 year old with chronic constipation, hypertension, coronary artery disease who underwent elective left total knee arthroplasty on 8/12.  No bowel movement since then.  Ongoing constipation not relieved by laxatives at home.  Started having nausea vomiting and abdominal distention and discomfort came to ER.  In the emergency room dehydrated.  WC count 19,000.  Tachycardic.  CT scan of the abdomen pelvis with stool burden, colitis, known diverticulosis.  Patient seen and examined.  Admitted early morning hours by nighttime hospitalist.  More information gathered as below.   Assessment & Plan:   Postoperative ileus: Multifactorial.  With history of constipation, postsurgical immobility, opiate use.  Patient with significant symptoms with nausea vomiting. SIRS criteria met on admission, tachycardia, tachypnea and leukocytosis.  Possible colitis however known history of extensive diverticulosis.  Plan: Adequately resuscitated.  Continue maintenance fluid. Clinical exam and CT scan with no evidence of perforation, complications. Responded very well to 1 dose of Dulcolax suppository with large bowel movement and improvement of symptoms. KUB to confirm no complications.  If KUB stable, will start on clears and advance slowly.  Will start on laxatives with lactulose.   Symptomatology could be related to constipation and dehydration, however will continue antibiotics today pending cultures and clinical improvement.  Monitor electrolytes and WBC count.  Mobilize.  Physical exam: Abdomen is distended, bowel sounds are present.  No rigidity or guarding.  No localized tenderness.   DVT prophylaxis: enoxaparin (LOVENOX) injection 40 mg Start: 07/15/23 1000   Code Status: Full code Family Communication: None at the bedside Disposition Plan: Status is: Observation The  patient will require care spanning > 2 midnights and should be moved to inpatient because: Significant symptomatic constipation, abdominal distention, IV antibiotics.    LOS: 0 days    Time spent: 40 minutes.  Same-day admit.  No charge visit.    Dorcas Carrow, MD [facility]

## 2023-07-15 NOTE — Progress Notes (Signed)
Pharmacy Antibiotic Note  David Goodman is a 82 y.o. male admitted on 07/14/2023 with sepsis.  Pharmacy has been consulted for Zosyn + Vancomycin dosing.  Plan: Zosyn 3.375g IV q8h (4 hour infusion). Vancomycin 1250mg  IV q24h to target AUC 400-550.  Estimated AUC on this regimen 538. Monitor renal function and cx data  Check daily Scr while on Zosyn + Vancomycin   Height: 5' 4.5" (163.8 cm) Weight: 78 kg (171 lb 15.3 oz) IBW/kg (Calculated) : 60.35  Temp (24hrs), Avg:98.3 F (36.8 C), Min:97.8 F (36.6 C), Max:98.6 F (37 C)  Recent Labs  Lab 07/12/23 0341 07/14/23 1637 07/14/23 1840 07/14/23 2203 07/14/23 2355  WBC 15.3* 18.9*  --   --   --   CREATININE 0.95  --  1.11  --   --   LATICACIDVEN  --   --   --  2.4* 2.5*    Estimated Creatinine Clearance: 49.8 mL/min (by C-G formula based on SCr of 1.11 mg/dL).    No Known Allergies  Antimicrobials this admission: 8/15 Vancomycin >>  8/15 Zosyn >>   Dose adjustments this admission:  Microbiology results: 8/15 BCx:  8/15 UCx:     Thank you for allowing pharmacy to be a part of this patient's care.  Junita Push PharmD 07/15/2023 12:13 AM

## 2023-07-15 NOTE — Sepsis Progress Note (Signed)
Elink monitoring for the code sepsis protocol.   Notified provider and bedside nurse of need to order 3rd lactic acid.

## 2023-07-16 ENCOUNTER — Inpatient Hospital Stay (HOSPITAL_COMMUNITY): Payer: Medicare Other

## 2023-07-16 DIAGNOSIS — R6510 Systemic inflammatory response syndrome (SIRS) of non-infectious origin without acute organ dysfunction: Secondary | ICD-10-CM | POA: Diagnosis not present

## 2023-07-16 LAB — BASIC METABOLIC PANEL
Anion gap: 9 (ref 5–15)
Anion gap: 8 (ref 5–15)
BUN: 21 mg/dL (ref 8–23)
BUN: 21 mg/dL (ref 8–23)
CO2: 25 mmol/L (ref 22–32)
CO2: 22 mmol/L (ref 22–32)
Calcium: 7.3 mg/dL — ABNORMAL LOW (ref 8.9–10.3)
Calcium: 7.4 mg/dL — ABNORMAL LOW (ref 8.9–10.3)
Chloride: 99 mmol/L (ref 98–111)
Chloride: 102 mmol/L (ref 98–111)
Creatinine, Ser: 0.87 mg/dL (ref 0.61–1.24)
Creatinine, Ser: 0.86 mg/dL (ref 0.61–1.24)
GFR, Estimated: >60 mL/min (ref >60)
GFR, Estimated: >60 mL/min (ref >60)
Glucose, Bld: 117 mg/dL — ABNORMAL HIGH (ref 70–99)
Glucose, Bld: 91 mg/dL (ref 70–99)
Potassium: 2.7 mmol/L — CL (ref 3.5–5.1)
Potassium: 3.3 mmol/L — ABNORMAL LOW (ref 3.5–5.1)
Sodium: 133 mmol/L — ABNORMAL LOW (ref 135–145)
Sodium: 132 mmol/L — ABNORMAL LOW (ref 135–145)

## 2023-07-16 LAB — CBC WITH DIFFERENTIAL/PLATELET
Abs Immature Granulocytes: 0.08 10*3/uL — ABNORMAL HIGH (ref 0.00–0.07)
Basophils Absolute: 0 10*3/uL (ref 0.0–0.1)
Basophils Relative: 0 %
Eosinophils Absolute: 0.1 10*3/uL (ref 0.0–0.5)
Eosinophils Relative: 1 %
HCT: 27.3 % — ABNORMAL LOW (ref 39.0–52.0)
Hemoglobin: 9.1 g/dL — ABNORMAL LOW (ref 13.0–17.0)
Immature Granulocytes: 1 %
Lymphocytes Relative: 5 %
Lymphs Abs: 0.7 10*3/uL (ref 0.7–4.0)
MCH: 29.5 pg (ref 26.0–34.0)
MCHC: 33.3 g/dL (ref 30.0–36.0)
MCV: 88.6 fL (ref 80.0–100.0)
Monocytes Absolute: 1.3 10*3/uL — ABNORMAL HIGH (ref 0.1–1.0)
Monocytes Relative: 9 %
Neutro Abs: 12.6 10*3/uL — ABNORMAL HIGH (ref 1.7–7.7)
Neutrophils Relative %: 84 %
Platelets: 235 10*3/uL (ref 150–400)
RBC: 3.08 MIL/uL — ABNORMAL LOW (ref 4.22–5.81)
RDW: 14.6 % (ref 11.5–15.5)
WBC: 14.8 10*3/uL — ABNORMAL HIGH (ref 4.0–10.5)
nRBC: 0 % (ref 0.0–0.2)

## 2023-07-16 LAB — URINE CULTURE: Culture: NO GROWTH

## 2023-07-16 LAB — MAGNESIUM
Magnesium: 2.7 mg/dL — ABNORMAL HIGH (ref 1.7–2.4)
Magnesium: 2.4 mg/dL (ref 1.7–2.4)

## 2023-07-16 LAB — PHOSPHORUS: Phosphorus: 2.7 mg/dL (ref 2.5–4.6)

## 2023-07-16 MED ORDER — MAGNESIUM SULFATE 2 GM/50ML IV SOLN: 2.0000 g | Freq: Once | INTRAVENOUS | Status: DC

## 2023-07-16 MED ORDER — POTASSIUM CHLORIDE CRYS ER 20 MEQ PO TBCR
20.0000 meq | EXTENDED_RELEASE_TABLET | Freq: Once | ORAL | Status: AC
  Administered 2023-07-16: 20 meq via ORAL
  Filled 2023-07-16: qty 1

## 2023-07-16 MED ORDER — POTASSIUM CHLORIDE 10 MEQ/100ML IV SOLN
10.0000 meq | INTRAVENOUS | Status: AC
  Administered 2023-07-16 (×6): 10 meq via INTRAVENOUS
  Filled 2023-07-16 (×6): qty 100

## 2023-07-16 MED ORDER — MELATONIN 5 MG PO TABS
5.0000 mg | ORAL_TABLET | Freq: Once | ORAL | Status: AC
  Administered 2023-07-16: 5 mg via ORAL
  Filled 2023-07-16: qty 1

## 2023-07-16 MED ORDER — VANCOMYCIN HCL IN DEXTROSE 1-5 GM/200ML-% IV SOLN
1000.0000 mg | INTRAVENOUS | Status: DC
  Administered 2023-07-16 – 2023-07-17 (×2): 1000 mg via INTRAVENOUS
  Filled 2023-07-16 (×2): qty 200

## 2023-07-16 MED ORDER — POTASSIUM CHLORIDE CRYS ER 20 MEQ PO TBCR
40.0000 meq | EXTENDED_RELEASE_TABLET | Freq: Once | ORAL | Status: AC
  Administered 2023-07-16: 40 meq via ORAL
  Filled 2023-07-16: qty 2

## 2023-07-16 NOTE — Progress Notes (Signed)
PROGRESS NOTE David Goodman  UVO:[id number] DOB: 11-15-1941 DOA: 07/14/2023 PCP: Loyola Mast, MD  Brief Narrative/Hospital Course: 82 year old with chronic constipation, hypertension, coronary artery disease who underwent elective left total knee arthroplasty on 8/12.  No bowel movement since then.  Ongoing constipation not relieved by laxatives at home.  Started having nausea vomiting and abdominal distention and discomfort came to ER.  In the emergency room dehydrated.  WC count 19,000.  Tachycardic.  CT scan of the abdomen pelvis with stool burden, colitis, known diverticulosis. Vitals stable afebrile on admission.  Labs showed hyponatremia mild hypokalemia stable LFTs with T. bili 1.7 lactic acidosis up to 2.6, leukocytosis 18.9 UA unremarkable. Patient fluid resuscitated placed on IV vancomycin, Zosyn and admitted.     Subjective: Seen and examined Overnight patient has been afebrile mild tachycardia 2326, BP stable Did not use BiPAP Labs showed potassium 2.7 mag 2.7 leukocytosis better at 14.8 A.m. cortisol 24.6 Patient having multiple BMs after intense laxative yesterday Complains of mid abdominal pain abdomen less distended no nausea vomiting He is hard of hearing.  Assessment and Plan: Principal Problem:   SIRS (systemic inflammatory response syndrome) (HCC) Active Problems:   Postoperative ileus (HCC)   Essential hypertension   OSA on CPAP   Sepsis (HCC)   Assessment and Plan:  SIRS: With tachycardia tachypnea leukocytosis, hypotension, lactic acidosis Colitis of distal colon Severe sigmoid diverticulosis Constipation Suspected possible ileus: CT angio chest, chest abdomen pelvis on admission severe sigmoid diverticulosis, colitis of the distal colon no bowel obstruction no intrathoracic pathology.Patient having multiple BM after laxatives.Abdomen less distended. Vitals stabilized with IV fluids WBC count improving.  Continue on empiric Zosyn/vancomycin, IV fluids,CLD.  Case was discussed with general surgery 8/16 by [facility] and nonsurgical management at this time.Hold further laxatives.   Recent Labs  Lab 07/12/23 0341 07/14/23 1637 07/14/23 2203 07/14/23 2355 07/15/23 0158 07/15/23 0442 07/16/23 0228  WBC 15.3* 18.9*  --   --   --  16.8* 14.8*  LATICACIDVEN  --   --  2.4* 2.5* 2.6* 2.1*  --   PROCALCITON  --   --   --   --   --  2.06  --     OSA on CPAP CPAP at bedtime-noncompliant  Essential hypertension BP well-controlled holding home meds   Hypokalemia: Repleting aggressively. Mag ok. Recent Labs  Lab 07/12/23 0341 07/14/23 1840 07/15/23 0442 07/16/23 0228  K 3.8 3.2* 3.6 2.7*    Hyponatremia: Sodium improving keep IVF Recent Labs  Lab 07/12/23 0341 07/14/23 1840 07/15/23 0442 07/16/23 0228  NA 139 132* 130* 133*    Class I Obesity:Patient's Body mass index is 31.48 kg/m. : Will benefit with PCP follow-up, weight loss  healthy lifestyle and outpatient sleep evaluation.   DVT prophylaxis: enoxaparin (LOVENOX) injection 40 mg Start: 07/15/23 1000 Code Status:   Code Status: Full Code Family Communication: plan of care discussed with patient at bedside. Discussed with nursing staff. Patient status is: Inpatient because of SIRS Level of care: Stepdown   Dispo: The patient is from: home w/ wife            Anticipated disposition: TBD. PTOT as able  Objective: Vitals last 24 hrs: Vitals:   07/16/23 0000 07/16/23 0700 07/16/23 0800 07/16/23 0842  BP: (!) 112/49 (!) 137/56 (!) 129/100   Pulse: 86 84 84   Resp: (!) 26 (!) 23 18   Temp:    99.7 F (37.6 C)  TempSrc:    Oral  SpO2: Marland Kitchen)  89% 98% 95%   Weight:      Height:       Weight change: 5.2 kg  Physical Examination: General exam: alert awake,hard of hearing, on [place], older than stated age HEENT:Oral mucosa moist, Ear/Nose WNL grossly Respiratory system: bilaterally clear BS, no use of accessory muscle Cardiovascular system: S1 & S2 +, No JVD. Gastrointestinal  system: Abdomen soft, moderately distended mild tenderness in the mid abdomen, BS+ Nervous System:Alert, awake, moving extremities. Extremities: LE edema mild,distal peripheral pulses palpable.  Skin: No rashes,no icterus. MSK: Normal muscle bulk,tone, power  Medications reviewed:  Scheduled Meds:  aspirin  81 mg Oral BID   And   [START ON 08/02/2023] aspirin  81 mg Oral Daily   Chlorhexidine Gluconate Cloth  6 each Topical Daily   enoxaparin (LOVENOX) injection  40 mg Subcutaneous Q24H   latanoprost  1 drop Both Eyes QHS   tamsulosin  0.4 mg Oral Daily   Continuous Infusions:  lactated ringers 125 mL/hr at 07/16/23 0800   piperacillin-tazobactam (ZOSYN)  IV 12.5 mL/hr at 07/16/23 0800   potassium chloride 10 mEq (07/16/23 0803)   vancomycin      Diet Order             Diet clear liquid Room service appropriate? Yes; Fluid consistency: Thin  Diet effective now                   Intake/Output Summary (Last 24 hours) at 07/16/2023 0952 Last data filed at 07/16/2023 0800 Gross per 24 hour  Intake 4359.99 ml  Output --  Net 4359.99 ml   Net IO Since Admission: 7,209.99 mL [07/16/23 0952]  Wt Readings from Last 3 Encounters:  07/15/23 83.2 kg  07/11/23 78 kg  07/01/23 78 kg     Unresulted Labs (From admission, onward)     Start     Ordered   07/17/23 0500  CBC  Daily,   R      07/16/23 0720   07/16/23 1500  Basic metabolic panel  Once-Timed,   TIMED        07/16/23 0720          Data Reviewed: I have personally reviewed following labs and imaging studies CBC: Recent Labs  Lab 07/12/23 0341 07/14/23 1637 07/15/23 0442 07/16/23 0228  WBC 15.3* 18.9* 16.8* 14.8*  NEUTROABS  --  17.5*  --  12.6*  HGB 11.2* 12.5* 10.2* 9.1*  HCT 33.4* 39.8 31.0* 27.3*  MCV 85.6 93.0 88.3 88.6  PLT 214 228 250 235   Basic Metabolic Panel: Recent Labs  Lab 07/12/23 0341 07/14/23 1840 07/15/23 0442 07/16/23 0228  NA 139 132* 130* 133*  K 3.8 3.2* 3.6 2.7*  CL 106 94*  94* 99  CO2 22 26 23 25   GLUCOSE 121* 111* 122* 117*  BUN 20 29* 26* 21  CREATININE 0.95 1.11 1.02 0.87  CALCIUM 8.5* 8.1* 7.8* 7.3*  MG  --   --   --  2.7*  PHOS  --   --   --  2.7   GFR: Estimated Creatinine Clearance: 64.8 mL/min (by C-G formula based on SCr of 0.87 mg/dL). Liver Function Tests: Recent Labs  Lab 07/14/23 1840 07/15/23 0442  AST 25 32  ALT 18 17  ALKPHOS 49 49  BILITOT 1.7* 1.4*  PROT 6.0* 6.0*  ALBUMIN 3.5 3.1*   Recent Labs  Lab 07/15/23 0442  INR 1.3*   Sepsis Labs: Recent Labs  Lab 07/14/23 2203 07/14/23  2355 07/15/23 0158 07/15/23 0442  PROCALCITON  --   --   --  2.06  LATICACIDVEN 2.4* 2.5* 2.6* 2.1*    Recent Results (from the past 240 hour(s))  SARS Coronavirus 2 by RT PCR (hospital order, performed in [facility] hospital lab) *cepheid single result test* Anterior Nasal Swab     Status: None   Collection Time: 07/14/23  4:37 PM   Specimen: Anterior Nasal Swab  Result Value Ref Range Status   SARS Coronavirus 2 by RT PCR NEGATIVE NEGATIVE Final    Comment: (NOTE) SARS-CoV-2 target nucleic acids are NOT DETECTED.  The SARS-CoV-2 RNA is generally detectable in upper and lower respiratory specimens during the acute phase of infection. The lowest concentration of SARS-CoV-2 viral copies this assay can detect is 250 copies / mL. A negative result does not preclude SARS-CoV-2 infection and should not be used as the sole basis for treatment or other patient management decisions.  A negative result may occur with improper specimen collection / handling, submission of specimen other than nasopharyngeal swab, presence of viral mutation(s) within the areas targeted by this assay, and inadequate number of viral copies (<250 copies / mL). A negative result must be combined with clinical observations, patient history, and epidemiological information.  Fact Sheet for Patients:   RoadLapTop.co.za  Fact Sheet for  Healthcare Providers: http://kim-miller.com/  This test is not yet approved or  cleared by the [place] FDA and has been authorized for detection and/or diagnosis of SARS-CoV-2 by FDA under an Emergency Use Authorization (EUA).  This EUA will remain in effect (meaning this test can be used) for the duration of the COVID-19 declaration under Section 564(b)(1) of the Act, 21 U.S.C. section 360bbb-3(b)(1), unless the authorization is terminated or revoked sooner.  Performed at [facility], [address], [place], [place] [address]   Urine Culture     Status: None   Collection Time: 07/14/23  9:18 PM   Specimen: Urine, Clean Catch  Result Value Ref Range Status   Specimen Description   Final    URINE, CLEAN CATCH Performed at [facility], [address], [place], [place] [address]    Special Requests   Final    NONE Performed at [facility], [address] [place], [place] [address]    Culture   Final    NO GROWTH Performed at [facility], [address] [place], [place] [address]    Report Status 07/16/2023 FINAL  Final  Blood culture (routine x 2)     Status: None (Preliminary result)   Collection Time: 07/14/23  9:47 PM   Specimen: BLOOD  Result Value Ref Range Status   Specimen Description   Final    BLOOD BLOOD RIGHT FOREARM Performed at [facility], [address], [place], [place] [address]    Special Requests   Final    BOTTLES DRAWN AEROBIC AND ANAEROBIC Blood Culture adequate volume Performed at [facility], [address] [place], [place] [address]    Culture   Final    NO GROWTH 2 DAYS Performed at [facility], [address] [place], [place] [address]    Report Status PENDING  Incomplete  Blood culture (routine x 2)     Status: None (Preliminary result)   Collection Time: 07/14/23  9:51 PM   Specimen: BLOOD  Result  Value Ref Range Status   Specimen Description   Final  BLOOD BLOOD RIGHT FOREARM Performed at [facility], [address] [place], [place] [address]    Special Requests   Final    BOTTLES DRAWN AEROBIC AND ANAEROBIC Blood Culture adequate volume Performed at [facility], [address], [place], [place] [address]    Culture   Final    NO GROWTH 2 DAYS Performed at [facility], [address] [place], [place] [address]    Report Status PENDING  Incomplete    Antimicrobials: Anti-infectives (From admission, onward)    Start     Dose/Rate Route Frequency Ordered Stop   07/16/23 2200  vancomycin (VANCOCIN) IVPB 1000 mg/200 mL premix        1,000 mg 200 mL/hr over 60 Minutes Intravenous Every 24 hours 07/16/23 0751     07/16/23 0000  vancomycin (VANCOREADY) IVPB 1250 mg/250 mL  Status:  Discontinued        1,250 mg 166.7 mL/hr over 90 Minutes Intravenous Every 24 hours 07/15/23 0011 07/16/23 0751   07/15/23 0400  piperacillin-tazobactam (ZOSYN) IVPB 3.375 g        3.375 g 12.5 mL/hr over 240 Minutes Intravenous Every 8 hours 07/15/23 0011     07/14/23 2245  vancomycin (VANCOREADY) IVPB 1500 mg/300 mL        1,500 mg 150 mL/hr over 120 Minutes Intravenous  Once 07/14/23 2239 07/15/23 0140   07/14/23 2145  piperacillin-tazobactam (ZOSYN) IVPB 3.375 g        3.375 g 100 mL/hr over 30 Minutes Intravenous  Once 07/14/23 2143 07/14/23 2234      Culture/Microbiology    Component Value Date/Time   SDES  07/14/2023 2151    BLOOD BLOOD RIGHT FOREARM Performed at [facility], [address] [place], [place] [address]    SPECREQUEST  07/14/2023 2151    BOTTLES DRAWN AEROBIC AND ANAEROBIC Blood Culture adequate volume Performed at [facility], [address], [place], [place] [address]    CULT  07/14/2023 2151    NO GROWTH 2 DAYS Performed at [facility], [address],  [place], [place] [address]    REPTSTATUS PENDING 07/14/2023 2151    Other culture-see note  Radiology Studies: DG Abd 1 View  Result Date: 07/15/2023 CLINICAL DATA:  Follow-up bowel obstruction. EXAM: ABDOMEN - 1 VIEW COMPARISON:  CT from 07/14/2023 FINDINGS: Gaseous distension of the colon up to the level of the splenic flexure is again noted and appears similar to the exam from 07/14/2023. Beyond this level, the colon appears decreased in caliber but contains gas up to the rectum. Scattered air-filled loops of small bowel are noted which measure up to 2.9 cm. IMPRESSION: 1. Persistent gaseous distension of the colon up to the level of the splenic flexure. Beyond this level, the colon appears decreased in caliber but contains gas up to the rectum. Imaging findings likely reflect colitis involving the left colon as demonstrated on the prior day CT. Electronically Signed   By: Signa Kell M.D.   On: 07/15/2023 16:31   CT Angio Chest PE W and/or Wo Contrast  Result Date: 07/14/2023 CLINICAL DATA:  Recent left knee replacement. Concern for pulmonary embolism and bowel obstruction. EXAM: CT ANGIOGRAPHY CHEST CT ABDOMEN AND PELVIS WITH CONTRAST TECHNIQUE: Multidetector CT imaging of the chest was performed using the standard protocol during bolus administration of intravenous contrast. Multiplanar CT image reconstructions and MIPs were obtained to evaluate the vascular anatomy. Multidetector CT imaging  of the abdomen and pelvis was performed using the standard protocol during bolus administration of intravenous contrast. RADIATION DOSE REDUCTION: This exam was performed according to the departmental dose-optimization program which includes automated exposure control, adjustment of the mA and/or kV according to patient size and/or use of iterative reconstruction technique. CONTRAST:  OMNIPAQUE IOHEXOL 350 MG/ML SOLN COMPARISON:  Chest radiograph dated 09/05/2022. FINDINGS: Evaluation of this exam is limited  due to respiratory motion artifact. CTA CHEST FINDINGS Cardiovascular: There is no cardiomegaly or pericardial effusion. There is 3 vessel coronary vascular calcification. Mild atherosclerotic calcification of the thoracic aorta. No aneurysmal dilatation or dissection. The origins of the great vessels of the aortic arch appear patent. Evaluation of the pulmonary arteries is limited due to severe respiratory motion. No central pulmonary artery embolus identified. Mediastinum/Nodes: No hilar or mediastinal adenopathy. The esophagus is grossly unremarkable. No mediastinal fluid collection. Lungs/Pleura: Bilateral interstitial streaky atelectasis. No focal consolidation, pleural effusion, or pneumothorax. The central airways are patent. Musculoskeletal: No acute osseous pathology. Review of the MIP images confirms the above findings. CT ABDOMEN and PELVIS FINDINGS No intra-abdominal free air.  Trace free fluid in the pelvis. Hepatobiliary: The liver is unremarkable. No biliary dilatation with the gallbladder is unremarkable. Pancreas: Unremarkable. No pancreatic ductal dilatation or surrounding inflammatory changes. Spleen: The spleen is unremarkable. Adrenals/Urinary Tract: The adrenal glands are unremarkable. There is no hydronephrosis on either side. There is symmetric enhancement and excretion of contrast by both kidneys. The visualized ureters and urinary bladder appear unremarkable. Stomach/Bowel: There is severe sigmoid diverticulosis as well as additional scattered colonic diverticula. There is diffuse inflammatory changes of the distal colon extending from the mid transverse colon to the sigmoid colon consistent with colitis. Loose stool throughout the colon consistent with diarrheal state. There is no bowel obstruction. The appendix is normal. Vascular/Lymphatic: Moderate aortoiliac atherosclerotic disease. The IVC is unremarkable. No portal venous gas. There is no adenopathy. Reproductive: Mildly enlarged  prostate gland measuring 4.5 cm in transaxial diameter. The seminal vesicles are symmetric. Other: There is edema as well as small pockets of air in the visualized upper left thigh, likely related to recent surgery. No fluid collection. Musculoskeletal: Bilateral L5 pars defects. There is degenerative changes of the lumbar spine. No acute osseous pathology. Review of the MIP images confirms the above findings. IMPRESSION: 1. No acute intrathoracic pathology. No CT evidence of central pulmonary artery embolus. 2. Colitis of the distal colon. No bowel obstruction. Normal appendix. 3. Severe sigmoid diverticulosis. 4.  Aortic Atherosclerosis (ICD10-I70.0). Electronically Signed   By: Elgie Collard M.D.   On: 07/14/2023 20:11   CT ABDOMEN PELVIS W CONTRAST  Result Date: 07/14/2023 CLINICAL DATA:  Recent left knee replacement. Concern for pulmonary embolism and bowel obstruction. EXAM: CT ANGIOGRAPHY CHEST CT ABDOMEN AND PELVIS WITH CONTRAST TECHNIQUE: Multidetector CT imaging of the chest was performed using the standard protocol during bolus administration of intravenous contrast. Multiplanar CT image reconstructions and MIPs were obtained to evaluate the vascular anatomy. Multidetector CT imaging of the abdomen and pelvis was performed using the standard protocol during bolus administration of intravenous contrast. RADIATION DOSE REDUCTION: This exam was performed according to the departmental dose-optimization program which includes automated exposure control, adjustment of the mA and/or kV according to patient size and/or use of iterative reconstruction technique. CONTRAST:  OMNIPAQUE IOHEXOL 350 MG/ML SOLN COMPARISON:  Chest radiograph dated 09/05/2022. FINDINGS: Evaluation of this exam is limited due to respiratory motion artifact. CTA CHEST FINDINGS Cardiovascular: There is  no cardiomegaly or pericardial effusion. There is 3 vessel coronary vascular calcification. Mild atherosclerotic calcification  of the thoracic aorta. No aneurysmal dilatation or dissection. The origins of the great vessels of the aortic arch appear patent. Evaluation of the pulmonary arteries is limited due to severe respiratory motion. No central pulmonary artery embolus identified. Mediastinum/Nodes: No hilar or mediastinal adenopathy. The esophagus is grossly unremarkable. No mediastinal fluid collection. Lungs/Pleura: Bilateral interstitial streaky atelectasis. No focal consolidation, pleural effusion, or pneumothorax. The central airways are patent. Musculoskeletal: No acute osseous pathology. Review of the MIP images confirms the above findings. CT ABDOMEN and PELVIS FINDINGS No intra-abdominal free air.  Trace free fluid in the pelvis. Hepatobiliary: The liver is unremarkable. No biliary dilatation with the gallbladder is unremarkable. Pancreas: Unremarkable. No pancreatic ductal dilatation or surrounding inflammatory changes. Spleen: The spleen is unremarkable. Adrenals/Urinary Tract: The adrenal glands are unremarkable. There is no hydronephrosis on either side. There is symmetric enhancement and excretion of contrast by both kidneys. The visualized ureters and urinary bladder appear unremarkable. Stomach/Bowel: There is severe sigmoid diverticulosis as well as additional scattered colonic diverticula. There is diffuse inflammatory changes of the distal colon extending from the mid transverse colon to the sigmoid colon consistent with colitis. Loose stool throughout the colon consistent with diarrheal state. There is no bowel obstruction. The appendix is normal. Vascular/Lymphatic: Moderate aortoiliac atherosclerotic disease. The IVC is unremarkable. No portal venous gas. There is no adenopathy. Reproductive: Mildly enlarged prostate gland measuring 4.5 cm in transaxial diameter. The seminal vesicles are symmetric. Other: There is edema as well as small pockets of air in the visualized upper left thigh, likely related to recent  surgery. No fluid collection. Musculoskeletal: Bilateral L5 pars defects. There is degenerative changes of the lumbar spine. No acute osseous pathology. Review of the MIP images confirms the above findings. IMPRESSION: 1. No acute intrathoracic pathology. No CT evidence of central pulmonary artery embolus. 2. Colitis of the distal colon. No bowel obstruction. Normal appendix. 3. Severe sigmoid diverticulosis. 4.  Aortic Atherosclerosis (ICD10-I70.0). Electronically Signed   By: Elgie Collard M.D.   On: 07/14/2023 20:11     LOS: 1 day   Lanae Boast, MD [facility]  07/16/2023, 9:52 AM

## 2023-07-16 NOTE — Progress Notes (Signed)
LDA made for R PIV, pt states that IV was placed in the ED. Time of placement is unknown as well as gauge size. IV flushed and saline locked.

## 2023-07-16 NOTE — Progress Notes (Signed)
   07/16/23 2257  BiPAP/CPAP/SIPAP  Reason BIPAP/CPAP not in use Other(comment) (patient states he does not wear Cpap)  BiPAP/CPAP /SiPAP Vitals  Pulse Rate (!) 35  Resp 16  SpO2 91 %  MEWS Score/Color  MEWS Score 2  MEWS Score Color Yellow

## 2023-07-16 NOTE — Hospital Course (Addendum)
82 year old with chronic constipation, hypertension, coronary artery disease who underwent elective left total knee arthroplasty on 8/12.  No bowel movement since then.  Ongoing constipation not relieved by laxatives at home.  Started having nausea vomiting and abdominal distention and discomfort came to ER.  In the emergency room dehydrated.  WC count 19,000.  Tachycardic.  CT scan of the abdomen pelvis with stool burden, colitis, known diverticulosis. Vitals stable afebrile on admission.  Labs showed hyponatremia mild hypokalemia stable LFTs with T. bili 1.7 lactic acidosis up to 2.6, leukocytosis 18.9 UA unremarkable. Patient fluid resuscitated placed on IV vancomycin, Zosyn and admitted.

## 2023-07-16 NOTE — Progress Notes (Signed)
Pharmacy Antibiotic Note  David Goodman is a 82 y.o. male admitted on 07/14/2023 with sepsis.  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  Pharmacy has been consulted for Zosyn + Vancomycin dosing.  Today, SCr has improved to 0.87 and vancomycin AUC recalculated to achieve goal.  Plan: Continue Zosyn 3.375g IV q8h (4 hour infusion). Change Vancomycin to 1000 mg IV q24h (Goal AUC 400-550, eAUC 474.3, SCr used: 0.87, Vd used: 0.5) Monitor renal function and cx data  Check daily Scr while on Zosyn + Vancomycin   Height: 5\' 4"  (162.6 cm) Weight: 83.2 kg (183 lb 6.8 oz) IBW/kg (Calculated) : 59.2  Temp (24hrs), Avg:98.3 F (36.8 C), Min:97.7 F (36.5 C), Max:98.8 F (37.1 C)  Recent Labs  Lab 07/12/23 0341 07/14/23 1637 07/14/23 1840 07/14/23 2203 07/14/23 2355 07/15/23 0158 07/15/23 0442 07/16/23 0228  WBC 15.3* 18.9*  --   --   --   --  16.8* 14.8*  CREATININE 0.95  --  1.11  --   --   --  1.02 0.87  LATICACIDVEN  --   --   --  2.4* 2.5* 2.6* 2.1*  --     Estimated Creatinine Clearance: 64.8 mL/min (by C-G formula based on SCr of 0.87 mg/dL).    No Known Allergies  Antimicrobials this admission: 8/15 Vancomycin >>  8/15 Zosyn >>   Dose adjustments this admission: 8/17 Change vancomycin from 1250 mg to 1000 mg IV q24h   Microbiology results: 8/15 BCx: NGTD 8/15 UCx:  sent 8/2 MRSA PCR: positive   Thank you for allowing pharmacy to be a part of this patient's care.  Selinda Eon, PharmD, BCPS Clinical Pharmacist [facility] 07/16/2023 7:56 AM

## 2023-07-16 NOTE — Plan of Care (Signed)
°  Problem: Education: °Goal: Knowledge of the prescribed therapeutic regimen will improve °Outcome: Progressing °  °Problem: Activity: °Goal: Ability to avoid complications of mobility impairment will improve °Outcome: Progressing °  °Problem: Clinical Measurements: °Goal: Postoperative complications will be avoided or minimized °Outcome: Progressing °  °

## 2023-07-17 ENCOUNTER — Inpatient Hospital Stay (HOSPITAL_COMMUNITY): Payer: Medicare Other

## 2023-07-17 DIAGNOSIS — R6510 Systemic inflammatory response syndrome (SIRS) of non-infectious origin without acute organ dysfunction: Secondary | ICD-10-CM | POA: Diagnosis not present

## 2023-07-17 LAB — CBC
HCT: 26.1 % — ABNORMAL LOW (ref 39.0–52.0)
Hemoglobin: 8.5 g/dL — ABNORMAL LOW (ref 13.0–17.0)
MCH: 28.7 pg (ref 26.0–34.0)
MCHC: 32.6 g/dL (ref 30.0–36.0)
MCV: 88.2 fL (ref 80.0–100.0)
Platelets: 233 10*3/uL (ref 150–400)
RBC: 2.96 MIL/uL — ABNORMAL LOW (ref 4.22–5.81)
RDW: 14.3 % (ref 11.5–15.5)
WBC: 11.2 10*3/uL — ABNORMAL HIGH (ref 4.0–10.5)
nRBC: 0 % (ref 0.0–0.2)

## 2023-07-17 LAB — BASIC METABOLIC PANEL
Anion gap: 9 (ref 5–15)
BUN: 19 mg/dL (ref 8–23)
CO2: 20 mmol/L — ABNORMAL LOW (ref 22–32)
Calcium: 7.2 mg/dL — ABNORMAL LOW (ref 8.9–10.3)
Chloride: 104 mmol/L (ref 98–111)
Creatinine, Ser: 0.77 mg/dL (ref 0.61–1.24)
GFR, Estimated: >60 mL/min (ref >60)
Glucose, Bld: 87 mg/dL (ref 70–99)
Potassium: 3.2 mmol/L — ABNORMAL LOW (ref 3.5–5.1)
Sodium: 133 mmol/L — ABNORMAL LOW (ref 135–145)

## 2023-07-17 MED ORDER — TRAMADOL HCL 50 MG PO TABS
50.0000 mg | ORAL_TABLET | Freq: Four times a day (QID) | ORAL | Status: DC | PRN
  Administered 2023-07-17 – 2023-07-20 (×8): 50 mg via ORAL
  Filled 2023-07-17 (×8): qty 1

## 2023-07-17 MED ORDER — OXYCODONE HCL 5 MG PO TABS
5.0000 mg | ORAL_TABLET | Freq: Four times a day (QID) | ORAL | Status: DC | PRN
  Administered 2023-07-17 – 2023-07-20 (×8): 5 mg via ORAL
  Filled 2023-07-17 (×9): qty 1

## 2023-07-17 MED ORDER — CLONAZEPAM 0.125 MG PO TBDP
0.1250 mg | ORAL_TABLET | Freq: Two times a day (BID) | ORAL | Status: DC | PRN
  Administered 2023-07-17 (×2): 0.125 mg via ORAL
  Filled 2023-07-17 (×2): qty 1

## 2023-07-17 MED ORDER — POTASSIUM CHLORIDE CRYS ER 20 MEQ PO TBCR
40.0000 meq | EXTENDED_RELEASE_TABLET | Freq: Once | ORAL | Status: AC
  Administered 2023-07-17: 40 meq via ORAL
  Filled 2023-07-17: qty 2

## 2023-07-17 MED ORDER — SIMETHICONE 80 MG PO CHEW
80.0000 mg | CHEWABLE_TABLET | Freq: Once | ORAL | Status: AC
  Administered 2023-07-17: 80 mg via ORAL
  Filled 2023-07-17: qty 1

## 2023-07-17 MED ORDER — PANTOPRAZOLE SODIUM 40 MG PO TBEC
40.0000 mg | DELAYED_RELEASE_TABLET | Freq: Every day | ORAL | Status: DC
  Administered 2023-07-17 – 2023-07-20 (×4): 40 mg via ORAL
  Filled 2023-07-17 (×4): qty 1

## 2023-07-17 MED ORDER — POTASSIUM CHLORIDE CRYS ER 20 MEQ PO TBCR
20.0000 meq | EXTENDED_RELEASE_TABLET | Freq: Every day | ORAL | Status: DC
  Administered 2023-07-17: 20 meq via ORAL
  Filled 2023-07-17: qty 1

## 2023-07-17 MED ORDER — ALUM & MAG HYDROXIDE-SIMETH 200-200-20 MG/5ML PO SUSP
30.0000 mL | Freq: Four times a day (QID) | ORAL | Status: DC | PRN
  Administered 2023-07-17: 30 mL via ORAL
  Filled 2023-07-17: qty 30

## 2023-07-17 MED ORDER — LACTATED RINGERS IV BOLUS: 250.0000 mL | Freq: Once | INTRAVENOUS | Status: DC

## 2023-07-17 MED ORDER — CLONAZEPAM 0.25 MG PO TBDP: 0.2500 mg | ORAL_TABLET | Freq: Every evening | ORAL | Status: DC | PRN

## 2023-07-17 NOTE — Evaluation (Signed)
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  Clinical Impression  Pt admitted as above and presenting with functional mobility limitations 2* decreased L LE strength/ROM, ongoing abdominal and L knee pain, and generalized weakness.  Pt hopes to progress to dc home with family assist and to continue OP PT as originally planned following recent TKR.      If plan is discharge home, recommend the following: A little help with walking and/or transfers;A little help with bathing/dressing/bathroom;Assistance with cooking/housework;Assist for transportation;Help with stairs or ramp for entrance   Can travel by private vehicle        Equipment Recommendations None recommended by PT  Recommendations for Other Services       Functional Status Assessment Patient has had a recent decline in their functional status and demonstrates the ability to make significant improvements in function in a reasonable and predictable amount of time.     Precautions / Restrictions Precautions Precautions: Fall;Knee Restrictions Weight Bearing Restrictions: No LLE Weight Bearing: Weight bearing as tolerated      Mobility  Bed Mobility Overal bed mobility: Needs Assistance Bed Mobility: Supine to Sit     Supine to sit: Mod assist     General bed mobility comments: cues to self assist, incr time, assist to bring trunk up and fwd    Transfers Overall transfer level: Needs assistance Equipment used: Rolling walker (2 wheels) Transfers: Sit to/from Stand Sit to Stand: Min assist, Mod assist, +2 safety/equipment, +2 physical assistance           General transfer comment: cues for LE management and use of UEs to self assist.  Physical assist to bring wt up and fwd and to balance in initial standing with RW     Ambulation/Gait Ambulation/Gait assistance: Min assist Gait Distance (Feet): 48 Feet Assistive device: Rolling walker (2 wheels) Gait Pattern/deviations: Step-to pattern, Decreased stance time - left Gait velocity: decr     General Gait Details: cues for posture and position from [employer]            Wheelchair Mobility     Tilt Bed    Modified Rankin (Stroke Patients Only)       Balance Overall balance assessment: Mild deficits observed, not formally tested                                           Pertinent Vitals/Pain Pain Assessment Pain Assessment: 0-10 Pain Score: 3  Pain Location: left knee Pain Descriptors / Indicators: Aching, Sore, Guarding Pain Intervention(s): Limited activity within patient's tolerance, Monitored during session, Premedicated before session    Home Living Family/patient expects to be discharged to:: Private residence Living Arrangements: Spouse/significant other Available Help at Discharge: Family Type of Home: Other(Comment) Home Access: Level entry       Home Layout: One level Home Equipment: Other (comment);Rolling Walker (2 wheels) Additional Comments: 3 wheeled    Prior Function Prior Level of Function : Independent/Modified Independent                     Extremity/Trunk Assessment   Upper Extremity Assessment Upper Extremity Assessment: Overall WFL for tasks assessed    Lower Extremity Assessment Lower Extremity  Assessment: LLE deficits/detail    Cervical / Trunk Assessment Cervical / Trunk Assessment: Normal  Communication   Communication Communication: No apparent difficulties  Cognition Arousal: Alert Behavior During Therapy: WFL for tasks assessed/performed Overall Cognitive Status: Within Functional Limits for tasks assessed                                          General Comments      Exercises     Assessment/Plan    PT Assessment Patient  needs continued PT services  PT Problem List Decreased strength;Decreased range of motion;Decreased activity tolerance;Decreased mobility;Decreased knowledge of precautions;Pain;Decreased knowledge of use of DME       PT Treatment Interventions DME instruction;Balance training;Gait training;Functional mobility training;Therapeutic activities;Therapeutic exercise;Patient/family education    PT Goals (Current goals can be found in the Care Plan section)  Acute Rehab PT Goals Patient Stated Goal: Regain IND PT Goal Formulation: With patient Time For Goal Achievement: 07/18/23 Potential to Achieve Goals: Good    Frequency 7X/week     Co-evaluation               AM-PAC PT "6 Clicks" Mobility  Outcome Measure Help needed turning from your back to your side while in a flat bed without using bedrails?: A Little Help needed moving from lying on your back to sitting on the side of a flat bed without using bedrails?: A Little Help needed moving to and from a bed to a chair (including a wheelchair)?: A Little Help needed standing up from a chair using your arms (e.g., wheelchair or bedside chair)?: A Lot Help needed to walk in hospital room?: A Little Help needed climbing 3-5 steps with a railing? : A Lot 6 Click Score: 16    End of Session Equipment Utilized During Treatment: Gait belt Activity Tolerance: Patient limited by fatigue Patient left: in chair;with call bell/phone within reach;with chair alarm set Nurse Communication: Mobility status PT Visit Diagnosis: Difficulty in walking, not elsewhere classified (R26.2)    Time: 9562-1308 PT Time Calculation (min) (ACUTE ONLY): 29 min   Charges:   PT Evaluation $PT Eval Low Complexity: 1 Low PT Treatments $Gait Training: 8-22 mins PT General Charges $$ ACUTE PT VISIT: 1 Visit         Mauro Kaufmann PT Acute Rehabilitation Services Pager [phone] Office [phone]   Analayah Brooke 07/17/2023, 2:10 PM

## 2023-07-17 NOTE — Progress Notes (Signed)
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    PT Comments  Pt assisted with therex program for recent TKR and up to [facility] x 3.    If plan is discharge home, recommend the following: A little help with walking and/or transfers;A little help with bathing/dressing/bathroom;Assistance with cooking/housework;Assist for transportation;Help with stairs or ramp for entrance   Can travel by private vehicle        Equipment Recommendations  None recommended by PT    Recommendations for Other Services       Precautions / Restrictions Precautions Precautions: Fall;Knee Restrictions Weight Bearing Restrictions: No LLE Weight Bearing: Weight bearing as tolerated     Mobility  Bed Mobility Overal bed mobility: Needs Assistance Bed Mobility: Supine to Sit     Supine to sit: Min assist, Mod assist     General bed mobility comments: cues to self assist, incr time, assist to bring trunk up and fwd    Transfers Overall transfer level: Needs assistance Equipment used: Rolling walker (2 wheels) Transfers: Sit to/from Stand, Bed to chair/wheelchair/BSC Sit to Stand: Min assist, Mod assist   Step pivot transfers: Min assist       General transfer comment: cues for LE management and use of UEs to self assist.  Physical assist to bring wt up and fwd and to balance in initial standing with RW.  Step pvt bed to BSC x 3    Ambulation/Gait Ambulation/Gait assistance: Min assist Gait Distance (Feet): 48 Feet Assistive device: Rolling walker (2 wheels) Gait Pattern/deviations: Step-to pattern, Decreased stance time - left Gait velocity: decr     General Gait Details: cues for posture and position from [employer]             Wheelchair Mobility     Tilt Bed    Modified Rankin  (Stroke Patients Only)       Balance Overall balance assessment: Mild deficits observed, not formally tested                                          Cognition Arousal: Alert Behavior During Therapy: WFL for tasks assessed/performed Overall Cognitive Status: Within Functional Limits for tasks assessed                                          Exercises Total Joint Exercises Ankle Circles/Pumps: AROM, Both, 15 reps, Supine Quad Sets: AROM, Both, 10 reps Heel Slides: AAROM, Left, 20 reps, Supine Hip ABduction/ADduction: AROM, AAROM, Left, 15 reps, Supine Straight Leg Raises: Left, AAROM, 20 reps, Supine    General Comments        Pertinent Vitals/Pain Pain Assessment Pain Assessment: 0-10 Pain Score: 4  Pain Location: left knee Pain Descriptors / Indicators: Aching, Sore, Guarding Pain Intervention(s): Limited activity within patient's tolerance, Monitored during session, Premedicated before session    Home Living Family/patient expects to be discharged to:: Private residence Living Arrangements: Spouse/significant other Available Help at Discharge: Family Type of Home: Other(Comment) Home Access: Level entry       Home Layout: One level Home Equipment: Other (comment);Rolling Walker (2 wheels)  Additional Comments: 3 wheeled    Prior Function            PT Goals (current goals can now be found in the care plan section) Acute Rehab PT Goals Patient Stated Goal: Regain IND PT Goal Formulation: With patient Time For Goal Achievement: 07/18/23 Potential to Achieve Goals: Good Progress towards PT goals: Progressing toward goals    Frequency    7X/week      PT Plan      Co-evaluation              AM-PAC PT "6 Clicks" Mobility   Outcome Measure  Help needed turning from your back to your side while in a flat bed without using bedrails?: A Little Help needed moving from lying on your back to sitting on the side  of a flat bed without using bedrails?: A Little Help needed moving to and from a bed to a chair (including a wheelchair)?: A Little Help needed standing up from a chair using your arms (e.g., wheelchair or bedside chair)?: A Little Help needed to walk in hospital room?: A Little Help needed climbing 3-5 steps with a railing? : A Lot 6 Click Score: 17    End of Session Equipment Utilized During Treatment: Gait belt Activity Tolerance: Patient tolerated treatment well Patient left: Other (comment) (BSC) Nurse Communication: Mobility status PT Visit Diagnosis: Difficulty in walking, not elsewhere classified (R26.2)     Time: 2956-2130 PT Time Calculation (min) (ACUTE ONLY): 31 min  Charges:    $Gait Training: 8-22 mins $Therapeutic Exercise: 8-22 mins $Therapeutic Activity: 8-22 mins PT General Charges $$ ACUTE PT VISIT: 1 Visit                     Mauro Kaufmann PT Acute Rehabilitation Services Pager [phone] Office [phone]    David Goodman 07/17/2023, 3:23 PM

## 2023-07-17 NOTE — Plan of Care (Signed)
  Problem: Clinical Measurements: Goal: Postoperative complications will be avoided or minimized Outcome: Progressing   Problem: Pain Management: Goal: Pain level will decrease with appropriate interventions Outcome: Progressing   Problem: Coping: Goal: Level of anxiety will decrease Outcome: Progressing   

## 2023-07-17 NOTE — Progress Notes (Signed)
PROGRESS NOTE David Goodman  ZOX:[id number] DOB: Jan 24, 1941 DOA: 07/14/2023 PCP: Loyola Mast, MD  Brief Narrative/Hospital Course: 82 year old with chronic constipation, hypertension, coronary artery disease who underwent elective left total knee arthroplasty on 8/12.  No bowel movement since then.  Ongoing constipation not relieved by laxatives at home.  Started having nausea vomiting and abdominal distention and discomfort came to ER.  In the emergency room dehydrated.  WC count 19,000.  Tachycardic.  CT scan of the abdomen pelvis with stool burden, colitis, known diverticulosis. Vitals stable afebrile on admission.  Labs showed hyponatremia mild hypokalemia stable LFTs with T. bili 1.7 lactic acidosis up to 2.6, leukocytosis 18.9 UA unremarkable. Patient fluid resuscitated placed on IV vancomycin, Zosyn and admitted.   Subjective: Seen and examined C/o similar abdomen pain in lwoer abdomen, with generalized tenderness No nausea or vomiting Asking for klonopin RN states he is going every hours for BM Overnight Tmax nine 9.7, heart rate stable respirations 17-24, BP stable, saturating well on room air  Labs shows improving leukocytosis, renal function stable potassium still low He is hard of hearing.  Assessment and Plan: Principal Problem:   SIRS (systemic inflammatory response syndrome) (HCC) Active Problems:   Postoperative ileus (HCC)   Essential hypertension   OSA on CPAP   Sepsis (HCC)   Assessment and Plan:  SIRS: With tachycardia tachypnea leukocytosis, hypotension, lactic acidosis Colitis of distal colon Severe sigmoid diverticulosis Constipation Suspected post op ileus: CT angio chest, chest abdomen pelvis on admission severe sigmoid diverticulosis, colitis of the distal colon no bowel obstruction no intrathoracic pathology and patient met SIRS criteria with tachypnea leukocytosis lactic acidosis> Concer for sepsis source Colitis. S/p laxatives and now having multiple  bowel movements> abdomen distention appears the same.  X-ray abdomen 8/17 restimulate moderate diffuse gaseous distention of the colon> please check x-ray, continue serial monitoring and abdominal exam Overall afebrile, leukocytosis improving.   Blood culture urine culture NGTD from 8/15.  Continue on empiric Zosyn/vancomycin, IV fluids and tolerating CLD- cont same Case was discussed with general surgery 8/16 by [facility] and nonsurgical management at this time.Hold further laxatives-repeat x-ray 8/17 similar gaseous distention monitor clinically.   If has ongoing multiple BM> will get rectal tube for comfort Recent Labs  Lab 07/12/23 0341 07/14/23 1637 07/14/23 2203 07/14/23 2355 07/15/23 0158 07/15/23 0442 07/16/23 0228 07/17/23 0313  WBC 15.3* 18.9*  --   --   --  16.8* 14.8* 11.2*  LATICACIDVEN  --   --  2.4* 2.5* 2.6* 2.1*  --   --   PROCALCITON  --   --   --   --   --  2.06  --   --     OSA on CPAP Noncompliant with CPAP   Essential hypertension BP Controlled. cont holding home meds   Hypokalemia: Repleted again w/ 40 po,addd 20 kdur daily  Recent Labs  Lab 07/14/23 1840 07/15/23 0442 07/16/23 0228 07/16/23 1529 07/17/23 0313  K 3.2* 3.6 2.7* 3.3* 3.2*    Hyponatremia: Sodium improving, cont gentle IV hydration Recent Labs  Lab 07/14/23 1840 07/15/23 0442 07/16/23 0228 07/16/23 1529 07/17/23 0313  NA 132* 130* 133* 132* 133*   Normocytic anemia: Previous baseline hemoglobin Variable as low as 8.3-9 in May 2023 but at one point in 12.  Monitor for any acute bleeding. Recent Labs  Lab 07/12/23 0341 07/14/23 1637 07/15/23 0442 07/16/23 0228 07/17/23 0313  HGB 11.2* 12.5* 10.2* 9.1* 8.5*  HCT 33.4* 39.8 31.0* 27.3* 26.1*  Recent Lt knee surgery- TKA Lt on  07/10/24 Dr Despina Hick for OA: Aquacel dressing + on lt knee.  No significant tenderness on exam. Cont ptot pain control, PTOT  Anxiety: Patient reports he takes Klonopin 0.5 mg and requesting same  Class  I Obesity:Patient's Body mass index is 31.48 kg/m. : Will benefit with PCP follow-up, weight loss  healthy lifestyle and outpatient sleep evaluation.   DVT prophylaxis: enoxaparin (LOVENOX) injection 40 mg Start: 07/15/23 1000 Code Status:   Code Status: Full Code Family Communication: plan of care discussed with patient at bedside. Discussed with nursing staff. Patient status is: Inpatient because of SIRS Level of care: Stepdown   Dispo: The patient is from: home w/ wife            Anticipated disposition: TBD. PTOT as able  Objective: Vitals last 24 hrs: Vitals:   07/17/23 0400 07/17/23 0417 07/17/23 0500 07/17/23 0600  BP:    (!) 149/51  Pulse: 67  83 81  Resp: (!) 22  17 (!) 24  Temp:  97.7 F (36.5 C)    TempSrc:  Oral    SpO2: 99%  96% 100%  Weight:      Height:       Weight change:   Physical Examination: General exam: alert awake, oriented, HARD OF HEARING HEENT:Oral mucosa moist, Ear/Nose WNL grossly Respiratory system: Bilaterally clear BS,no use of accessory muscle Cardiovascular system: S1 & S2 +, No JVD. Gastrointestinal system: Abdomen soft, distended w/ generalized tenderness no guarding or rebound, BS+ Nervous System: Alert, awake, moving all extremities,and following commands. Extremities: aquacel dressing + on lt knee LE edema neg,distal peripheral pulses palpable and warm.  Skin: No rashes,no icterus. MSK: Normal muscle bulk,tone, power   Medications reviewed:  Scheduled Meds:  aspirin  81 mg Oral BID   And   [START ON 08/02/2023] aspirin  81 mg Oral Daily   Chlorhexidine Gluconate Cloth  6 each Topical Daily   enoxaparin (LOVENOX) injection  40 mg Subcutaneous Q24H   latanoprost  1 drop Both Eyes QHS   potassium chloride  20 mEq Oral Daily   potassium chloride  40 mEq Oral Once   tamsulosin  0.4 mg Oral Daily   Continuous Infusions:  lactated ringers 125 mL/hr at 07/17/23 0629   piperacillin-tazobactam (ZOSYN)  IV 12.5 mL/hr at 07/17/23 3244    vancomycin Stopped (07/16/23 2356)    Diet Order             Diet clear liquid Room service appropriate? Yes; Fluid consistency: Thin  Diet effective now                   Intake/Output Summary (Last 24 hours) at 07/17/2023 0740 Last data filed at 07/17/2023 0102 Gross per 24 hour  Intake 3554.76 ml  Output --  Net 3554.76 ml   Net IO Since Admission: 10,334.34 mL [07/17/23 0740]  Wt Readings from Last 3 Encounters:  07/15/23 83.2 kg  07/11/23 78 kg  07/01/23 78 kg     Unresulted Labs (From admission, onward)     Start     Ordered   07/17/23 0500  CBC  Daily,   R      07/16/23 0720   07/17/23 0500  Basic metabolic panel  Daily,   R     Question:  Specimen collection method  Answer:  Lab=Lab collect   07/16/23 1007          Data Reviewed: I have personally reviewed  following labs and imaging studies CBC: Recent Labs  Lab 07/12/23 0341 07/14/23 1637 07/15/23 0442 07/16/23 0228 07/17/23 0313  WBC 15.3* 18.9* 16.8* 14.8* 11.2*  NEUTROABS  --  17.5*  --  12.6*  --   HGB 11.2* 12.5* 10.2* 9.1* 8.5*  HCT 33.4* 39.8 31.0* 27.3* 26.1*  MCV 85.6 93.0 88.3 88.6 88.2  PLT 214 228 250 235 233   Basic Metabolic Panel: Recent Labs  Lab 07/14/23 1840 07/15/23 0442 07/16/23 0228 07/16/23 1529 07/17/23 0313  NA 132* 130* 133* 132* 133*  K 3.2* 3.6 2.7* 3.3* 3.2*  CL 94* 94* 99 102 104  CO2 26 23 25 22  20*  GLUCOSE 111* 122* 117* 91 87  BUN 29* 26* 21 21 19   CREATININE 1.11 1.02 0.87 0.86 0.77  CALCIUM 8.1* 7.8* 7.3* 7.4* 7.2*  MG  --   --  2.7* 2.4  --   PHOS  --   --  2.7  --   --    GFR: Estimated Creatinine Clearance: 70.5 mL/min (by C-G formula based on SCr of 0.77 mg/dL). Liver Function Tests: Recent Labs  Lab 07/14/23 1840 07/15/23 0442  AST 25 32  ALT 18 17  ALKPHOS 49 49  BILITOT 1.7* 1.4*  PROT 6.0* 6.0*  ALBUMIN 3.5 3.1*   Recent Labs  Lab 07/15/23 0442  INR 1.3*   Sepsis Labs: Recent Labs  Lab 07/14/23 2203 07/14/23 2355  07/15/23 0158 07/15/23 0442  PROCALCITON  --   --   --  2.06  LATICACIDVEN 2.4* 2.5* 2.6* 2.1*    Recent Results (from the past 240 hour(s))  SARS Coronavirus 2 by RT PCR (hospital order, performed in [facility] hospital lab) *cepheid single result test* Anterior Nasal Swab     Status: None   Collection Time: 07/14/23  4:37 PM   Specimen: Anterior Nasal Swab  Result Value Ref Range Status   SARS Coronavirus 2 by RT PCR NEGATIVE NEGATIVE Final    Comment: (NOTE) SARS-CoV-2 target nucleic acids are NOT DETECTED.  The SARS-CoV-2 RNA is generally detectable in upper and lower respiratory specimens during the acute phase of infection. The lowest concentration of SARS-CoV-2 viral copies this assay can detect is 250 copies / mL. A negative result does not preclude SARS-CoV-2 infection and should not be used as the sole basis for treatment or other patient management decisions.  A negative result may occur with improper specimen collection / handling, submission of specimen other than nasopharyngeal swab, presence of viral mutation(s) within the areas targeted by this assay, and inadequate number of viral copies (<250 copies / mL). A negative result must be combined with clinical observations, patient history, and epidemiological information.  Fact Sheet for Patients:   RoadLapTop.co.za  Fact Sheet for Healthcare Providers: http://kim-miller.com/  This test is not yet approved or  cleared by the [place] FDA and has been authorized for detection and/or diagnosis of SARS-CoV-2 by FDA under an Emergency Use Authorization (EUA).  This EUA will remain in effect (meaning this test can be used) for the duration of the COVID-19 declaration under Section 564(b)(1) of the Act, 21 U.S.C. section 360bbb-3(b)(1), unless the authorization is terminated or revoked sooner.  Performed at [facility], [address], [place], [place] [address]   Urine Culture     Status: None   Collection Time: 07/14/23  9:18 PM   Specimen: Urine, Clean Catch  Result Value Ref Range Status   Specimen Description  Final    URINE, CLEAN CATCH Performed at [facility], [address], [place], [place] [address]    Special Requests   Final    NONE Performed at [facility], [address], [place], [place] [address]    Culture   Final    NO GROWTH Performed at [facility], [address] [place], [place] [address]    Report Status 07/16/2023 FINAL  Final  Blood culture (routine x 2)     Status: None (Preliminary result)   Collection Time: 07/14/23  9:47 PM   Specimen: BLOOD  Result Value Ref Range Status   Specimen Description   Final    BLOOD BLOOD RIGHT FOREARM Performed at [facility], [address], [place], [place] [address]    Special Requests   Final    BOTTLES DRAWN AEROBIC AND ANAEROBIC Blood Culture adequate volume Performed at [facility], [address] [place], [place] [address]    Culture   Final    NO GROWTH 3 DAYS Performed at [facility], [address], [place], [place] [address]    Report Status PENDING  Incomplete  Blood culture (routine x 2)     Status: None (Preliminary result)   Collection Time: 07/14/23  9:51 PM   Specimen: BLOOD  Result Value Ref Range Status   Specimen Description   Final    BLOOD BLOOD RIGHT FOREARM Performed at [facility], [address] [place], [place] [address]    Special Requests   Final    BOTTLES DRAWN AEROBIC AND ANAEROBIC Blood Culture adequate volume Performed at [facility], [address], [place], [place] [address]    Culture   Final    NO GROWTH 3 DAYS Performed at [facility], [address], [place], [place] [address]    Report Status PENDING  Incomplete    Antimicrobials: Anti-infectives  (From admission, onward)    Start     Dose/Rate Route Frequency Ordered Stop   07/16/23 2200  vancomycin (VANCOCIN) IVPB 1000 mg/200 mL premix        1,000 mg 200 mL/hr over 60 Minutes Intravenous Every 24 hours 07/16/23 0751     07/16/23 0000  vancomycin (VANCOREADY) IVPB 1250 mg/250 mL  Status:  Discontinued        1,250 mg 166.7 mL/hr over 90 Minutes Intravenous Every 24 hours 07/15/23 0011 07/16/23 0751   07/15/23 0400  piperacillin-tazobactam (ZOSYN) IVPB 3.375 g        3.375 g 12.5 mL/hr over 240 Minutes Intravenous Every 8 hours 07/15/23 0011     07/14/23 2245  vancomycin (VANCOREADY) IVPB 1500 mg/300 mL        1,500 mg 150 mL/hr over 120 Minutes Intravenous  Once 07/14/23 2239 07/15/23 0140   07/14/23 2145  piperacillin-tazobactam (ZOSYN) IVPB 3.375 g        3.375 g 100 mL/hr over 30 Minutes Intravenous  Once 07/14/23 2143 07/14/23 2234      Culture/Microbiology    Component Value Date/Time   SDES  07/14/2023 2151    BLOOD BLOOD RIGHT FOREARM Performed at [facility], [address] [place], [place] [address]    SPECREQUEST  07/14/2023 2151    BOTTLES DRAWN AEROBIC AND ANAEROBIC Blood Culture adequate volume Performed at [facility], [address] [place], [place] [address]    CULT  07/14/2023 2151  NO GROWTH 3 DAYS Performed at [facility], [address] [place], [place] [address]    REPTSTATUS PENDING 07/14/2023 2151    Other culture-see note  Radiology Studies: DG Abd 1 View  Result Date: 07/16/2023 CLINICAL DATA:  Abdominal distension, recent knee surgery EXAM: ABDOMEN - 1 VIEW COMPARISON:  07/15/2023 abdominal radiographs FINDINGS: Gas-filled nondilated small bowel loops throughout central abdomen, similar. Moderate diffuse gaseous distention of the colon, most prominent in the right colon, similar. No evidence of pneumatosis or pneumoperitoneum. Mild lumbar spondylosis. No radiopaque nephrolithiasis.  IMPRESSION: Nonobstructive bowel gas pattern. Similar moderate diffuse gaseous distention of the colon, most prominent in the right colon. Favor adynamic ileus. Electronically Signed   By: Delbert Phenix M.D.   On: 07/16/2023 16:56   DG Abd 1 View  Result Date: 07/15/2023 CLINICAL DATA:  Follow-up bowel obstruction. EXAM: ABDOMEN - 1 VIEW COMPARISON:  CT from 07/14/2023 FINDINGS: Gaseous distension of the colon up to the level of the splenic flexure is again noted and appears similar to the exam from 07/14/2023. Beyond this level, the colon appears decreased in caliber but contains gas up to the rectum. Scattered air-filled loops of small bowel are noted which measure up to 2.9 cm. IMPRESSION: 1. Persistent gaseous distension of the colon up to the level of the splenic flexure. Beyond this level, the colon appears decreased in caliber but contains gas up to the rectum. Imaging findings likely reflect colitis involving the left colon as demonstrated on the prior day CT. Electronically Signed   By: Signa Kell M.D.   On: 07/15/2023 16:31     LOS: 2 days   Lanae Boast, MD [facility]  07/17/2023, 7:40 AM

## 2023-07-18 ENCOUNTER — Inpatient Hospital Stay (HOSPITAL_COMMUNITY): Payer: Medicare Other

## 2023-07-18 DIAGNOSIS — R6510 Systemic inflammatory response syndrome (SIRS) of non-infectious origin without acute organ dysfunction: Secondary | ICD-10-CM | POA: Diagnosis not present

## 2023-07-18 LAB — BASIC METABOLIC PANEL
Anion gap: 9 (ref 5–15)
BUN: 14 mg/dL (ref 8–23)
CO2: 21 mmol/L — ABNORMAL LOW (ref 22–32)
Calcium: 7.5 mg/dL — ABNORMAL LOW (ref 8.9–10.3)
Chloride: 103 mmol/L (ref 98–111)
Creatinine, Ser: 0.75 mg/dL (ref 0.61–1.24)
GFR, Estimated: >60 mL/min (ref >60)
Glucose, Bld: 101 mg/dL — ABNORMAL HIGH (ref 70–99)
Potassium: 3.1 mmol/L — ABNORMAL LOW (ref 3.5–5.1)
Sodium: 133 mmol/L — ABNORMAL LOW (ref 135–145)

## 2023-07-18 LAB — MAGNESIUM: Magnesium: 2 mg/dL (ref 1.7–2.4)

## 2023-07-18 LAB — CBC
HCT: 28.1 % — ABNORMAL LOW (ref 39.0–52.0)
Hemoglobin: 9.3 g/dL — ABNORMAL LOW (ref 13.0–17.0)
MCH: 28.8 pg (ref 26.0–34.0)
MCHC: 33.1 g/dL (ref 30.0–36.0)
MCV: 87 fL (ref 80.0–100.0)
Platelets: 272 10*3/uL (ref 150–400)
RBC: 3.23 MIL/uL — ABNORMAL LOW (ref 4.22–5.81)
RDW: 14.1 % (ref 11.5–15.5)
WBC: 8.9 10*3/uL (ref 4.0–10.5)
nRBC: 0 % (ref 0.0–0.2)

## 2023-07-18 MED ORDER — CLONAZEPAM 0.125 MG PO TBDP
0.5000 mg | ORAL_TABLET | Freq: Two times a day (BID) | ORAL | Status: DC | PRN
  Administered 2023-07-18 – 2023-07-19 (×4): 0.5 mg via ORAL
  Filled 2023-07-18: qty 1
  Filled 2023-07-18: qty 4
  Filled 2023-07-18: qty 1
  Filled 2023-07-18: qty 4
  Filled 2023-07-18: qty 1

## 2023-07-18 MED ORDER — IRBESARTAN 300 MG PO TABS
300.0000 mg | ORAL_TABLET | Freq: Every day | ORAL | Status: DC
  Administered 2023-07-18 – 2023-07-19 (×2): 300 mg via ORAL
  Filled 2023-07-18 (×2): qty 1

## 2023-07-18 MED ORDER — POTASSIUM CHLORIDE CRYS ER 20 MEQ PO TBCR
40.0000 meq | EXTENDED_RELEASE_TABLET | Freq: Two times a day (BID) | ORAL | Status: AC
  Administered 2023-07-18 – 2023-07-19 (×4): 40 meq via ORAL
  Filled 2023-07-18 (×4): qty 2

## 2023-07-18 MED ORDER — BISACODYL 10 MG RE SUPP
10.0000 mg | Freq: Once | RECTAL | Status: AC
  Administered 2023-07-18: 10 mg via RECTAL
  Filled 2023-07-18: qty 1

## 2023-07-18 MED ORDER — POTASSIUM CHLORIDE 2 MEQ/ML IV SOLN
INTRAVENOUS | Status: DC
  Filled 2023-07-18 (×3): qty 1000

## 2023-07-18 MED ORDER — LEVALBUTEROL HCL 0.63 MG/3ML IN NEBU
0.6300 mg | INHALATION_SOLUTION | Freq: Four times a day (QID) | RESPIRATORY_TRACT | Status: DC | PRN
  Administered 2023-07-18: 0.63 mg via RESPIRATORY_TRACT
  Filled 2023-07-18: qty 3

## 2023-07-18 MED ORDER — LACTULOSE 10 GM/15ML PO SOLN
20.0000 g | Freq: Once | ORAL | Status: AC
  Administered 2023-07-18: 20 g via ORAL
  Filled 2023-07-18: qty 30

## 2023-07-18 NOTE — Progress Notes (Addendum)
Physical Therapy Treatment Patient Details Name: David Goodman MRN: [id number] DOB: 1941-07-24 Today's Date: 07/18/2023   History of Present Illness Pt s/p L TKR 07/11/23 and now readmitted from home with sepsis, post-op ileus, and sirs.    PT Comments  Pt is progressing nicely this session, tol incr amb distance although with 3/4 DOE after  ~80'. RR max 25, HR 75 SpO2= 97% on RA.  D/c plan remains appropriate, return to OPPT at d/c Continue to follow    If plan is discharge home, recommend the following: A little help with walking and/or transfers;A little help with bathing/dressing/bathroom;Assistance with cooking/housework;Assist for transportation;Help with stairs or ramp for entrance   Can travel by private vehicle        Equipment Recommendations  None recommended by PT    Recommendations for Other Services       Precautions / Restrictions Precautions Precautions: Fall;Knee Restrictions Weight Bearing Restrictions: No LLE Weight Bearing: Weight bearing as tolerated     Mobility  Bed Mobility               General bed mobility comments: pt in bathroom on arrival    Transfers Overall transfer level: Needs assistance Equipment used: Rolling walker (2 wheels) Transfers: Sit to/from Stand Sit to Stand: Min assist           General transfer comment: cues for LE management and use of UEs to self assist.  Physical assist to bring wt up and fwd and to balance in initial standing with RW    Ambulation/Gait Ambulation/Gait assistance: Min assist, +2 safety/equipment Gait Distance (Feet): 80 Feet Assistive device: Rolling walker (2 wheels) Gait Pattern/deviations: Step-to pattern, Decreased stance time - left Gait velocity: decr     General Gait Details: cues for posture and position from RW, one brief standing rest; dyspneic after amb.  RR max 25, HR 75 SpO2= 97% on RA   Stairs             Wheelchair Mobility     Tilt Bed    Modified Rankin  (Stroke Patients Only)       Balance                                            Cognition Arousal: Alert Behavior During Therapy: WFL for tasks assessed/performed Overall Cognitive Status: Within Functional Limits for tasks assessed                                          Exercises Total Joint Exercises Ankle Circles/Pumps: AROM, Both, 15 reps, Supine    General Comments        Pertinent Vitals/Pain Pain Assessment Pain Assessment: 0-10 Pain Score: 5  Pain Location: left knee and abd Pain Descriptors / Indicators: Aching, Sore, Tightness Pain Intervention(s): Limited activity within patient's tolerance, Monitored during session, Premedicated before session, Repositioned    Home Living                          Prior Function            PT Goals (current goals can now be found in the care plan section) Acute Rehab PT Goals Patient Stated Goal: Regain IND PT Goal Formulation: With patient Time  For Goal Achievement: 07/18/23 Potential to Achieve Goals: Good    Frequency    7X/week      PT Plan      Co-evaluation              AM-PAC PT "6 Clicks" Mobility   Outcome Measure  Help needed turning from your back to your side while in a flat bed without using bedrails?: A Little Help needed moving from lying on your back to sitting on the side of a flat bed without using bedrails?: A Little Help needed moving to and from a bed to a chair (including a wheelchair)?: A Little Help needed standing up from a chair using your arms (e.g., wheelchair or bedside chair)?: A Little Help needed to walk in hospital room?: A Little Help needed climbing 3-5 steps with a railing? : A Lot 6 Click Score: 17    End of Session Equipment Utilized During Treatment: Gait belt Activity Tolerance: Patient tolerated treatment well Patient left: with call bell/phone within reach;with chair alarm set;with family/visitor present;in  chair Nurse Communication: Mobility status PT Visit Diagnosis: Difficulty in walking, not elsewhere classified (R26.2)     Time: 4782-9562 PT Time Calculation (min) (ACUTE ONLY): 17 min  Charges:    $Gait Training: 8-22 mins PT General Charges $$ ACUTE PT VISIT: 1 Visit                     Mekaela Azizi, PT  Acute Rehab Dept [facility]) [phone]  07/18/2023    [facility] 07/18/2023, 12:31 PM

## 2023-07-18 NOTE — Plan of Care (Signed)
  Problem: Activity: Goal: Ability to avoid complications of mobility impairment will improve Outcome: Progressing Goal: Range of joint motion will improve Outcome: Progressing   Problem: Pain Management: Goal: Pain level will decrease with appropriate interventions Outcome: Progressing   Problem: Fluid Volume: Goal: Hemodynamic stability will improve Outcome: Progressing   Problem: Clinical Measurements: Goal: Diagnostic test results will improve Outcome: Progressing   Problem: Education: Goal: Knowledge of the prescribed therapeutic regimen will improve Outcome: Not Progressing   Problem: Respiratory: Goal: Ability to maintain adequate ventilation will improve Outcome: Not Progressing

## 2023-07-18 NOTE — Discharge Summary (Signed)
[name] MRN: [id number] DOB/AGE: 04/28/1941 82 y.o.  Admit date: 07/11/2023 Discharge date: 07/12/2023  Admission Diagnoses:  Principal Problem:   OA (osteoarthritis) of knee Active Problems:   Primary osteoarthritis of left knee   Discharge Diagnoses:  Same  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 male who was admitted 07/11/2023 for operative treatment ofOA (osteoarthritis) of knee. Patient has severe unremitting pain that affects sleep, daily activities, and work/hobbies. After pre-op clearance the patient was taken to the operating room on 07/11/2023 and underwent  Procedure(s): TOTAL KNEE ARTHROPLASTY.    Patient was given perioperative antibiotics:  Anti-infectives (From admission, onward)    Start     Dose/Rate Route Frequency Ordered Stop   07/11/23 1400  ceFAZolin (ANCEF) IVPB 2g/100 mL premix        2 g 200 mL/hr over 30 Minutes Intravenous Every 6 hours 07/11/23 1105  07/11/23 2057   07/11/23 0615  ceFAZolin (ANCEF) IVPB 2g/100 mL premix        2 g 200 mL/hr over 30 Minutes Intravenous On call to O.R. 07/11/23 0606 07/11/23 4098        Patient was given sequential compression devices, early ambulation, and chemoprophylaxis to prevent DVT.  Patient benefited maximally from hospital stay and there were no complications.    Recent vital signs: No data found.   Recent laboratory studies:  Recent Labs    07/17/23 0313 07/18/23 0309  WBC 11.2* 8.9  HGB 8.5* 9.3*  HCT 26.1* 28.1*  PLT 233 272  NA 133* 133*  K 3.2* 3.1*  CL 104 103  CO2 20* 21*  BUN 19 14  CREATININE 0.77 0.75  GLUCOSE 87 101*  CALCIUM 7.2* 7.5*     Discharge Medications:   Allergies as of 07/12/2023   No Known Allergies      Medication List     STOP taking these medications    aspirin EC 81 MG tablet Replaced by: aspirin 81 MG chewable tablet   HYDROcodone bit-homatropine 5-1.5 MG/5ML syrup Commonly known as: HYCODAN   naproxen 500 MG tablet Commonly known as: NAPROSYN       TAKE these medications    aspirin 81 MG chewable tablet Chew 1 tablet (81 mg total) by mouth 2 (two) times daily for 20 days. Then resume one 81 mg aspirin once a day Replaces: aspirin EC 81 MG tablet   B-12 PO Take 1 tablet by mouth daily.   hydrochlorothiazide 25 MG tablet Commonly known as: HYDRODIURIL Take 1 tablet (25 mg total)  by mouth daily.   latanoprost 0.005 % ophthalmic solution Commonly known as: XALATAN Place 1 drop into both eyes at bedtime.   MAGNESIUM PO Take 400 mg by mouth at bedtime.   methocarbamol 500 MG tablet Commonly known as: ROBAXIN Take 1 tablet (500 mg total) by mouth every 6 (six) hours as needed for muscle spasms.   nitroGLYCERIN 0.4 MG SL tablet Commonly known as: NITROSTAT Place 1 tablet (0.4 mg total) under the tongue every 5 (five) minutes as needed for chest pain.   ondansetron 4 MG tablet Commonly known as: ZOFRAN Take 1 tablet  (4 mg total) by mouth every 6 (six) hours as needed for nausea.   oxyCODONE 5 MG immediate release tablet Commonly known as: Oxy IR/ROXICODONE Take 1-2 tablets (5-10 mg total) by mouth every 8 (eight) hours as needed for severe pain.   pantoprazole 40 MG tablet Commonly known as: PROTONIX Take 1 tablet by mouth once daily   psyllium 58.6 % powder Commonly known as: METAMUCIL Take 1 packet by mouth daily.   REFRESH OP Place 1 drop into the left eye 2 (two) times daily as needed (dryness).   rosuvastatin 20 MG tablet Commonly known as: CRESTOR Take 1 tablet by mouth once daily   tadalafil 20 MG tablet Commonly known as: Cialis Take 1 tablet (20 mg total) by mouth daily as needed for erectile dysfunction.   tamsulosin 0.4 MG Caps capsule Commonly known as: FLOMAX TAKE 1 CAPSULE BY MOUTH DAILY   traMADol 50 MG tablet Commonly known as: ULTRAM Take 1-2 tablets (50-100 mg total) by mouth every 6 (six) hours as needed for moderate pain.   valsartan 320 MG tablet Commonly known as: DIOVAN Take 1 tablet by mouth once daily   vitamin C 1000 MG tablet Take 1,000 mg by mouth 2 (two) times a week.   zinc gluconate 50 MG tablet Take 50 mg by mouth daily.               Discharge Care Instructions  (From admission, onward)           Start     Ordered   07/12/23 0000  Weight bearing as tolerated        07/12/23 0826   07/12/23 0000  Change dressing       Comments: You may remove the bulky bandage (ACE wrap and gauze) two days after surgery. You will have an adhesive waterproof bandage underneath. Leave this in place until your first follow-up appointment.   07/12/23 0826            Diagnostic Studies: DG Abd 1 View  Result Date: 07/18/2023 CLINICAL DATA:  Ileus. EXAM: ABDOMEN - 1 VIEW COMPARISON:  July 17, 2023. FINDINGS: Mildly dilated and air-filled cecum is noted at approximately 10 cm. No other abnormal bowel dilatation is noted. IMPRESSION: Mildly  dilated and air-filled cecum is noted most consistent with ileus. Electronically Signed   By: Lupita Raider M.D.   On: 07/18/2023 09:33   DG Abd 1 View  Result Date: 07/17/2023 CLINICAL DATA:  Ileus EXAM: ABDOMEN - 1 VIEW COMPARISON:  Yesterday FINDINGS: Diffuse gaseous distension of colon and small bowel with mild improvement in the degree of distension. Gas still reaches the rectum. No concerning mass effect or calcification. IMPRESSION: Mild improvement in ileus pattern.  No new abnormality. Electronically Signed   By: Tiburcio Pea M.D.   On: 07/17/2023 12:55   DG Abd 1 View  Result Date: 07/16/2023 CLINICAL  DATA:  Abdominal distension, recent knee surgery EXAM: ABDOMEN - 1 VIEW COMPARISON:  07/15/2023 abdominal radiographs FINDINGS: Gas-filled nondilated small bowel loops throughout central abdomen, similar. Moderate diffuse gaseous distention of the colon, most prominent in the right colon, similar. No evidence of pneumatosis or pneumoperitoneum. Mild lumbar spondylosis. No radiopaque nephrolithiasis. IMPRESSION: Nonobstructive bowel gas pattern. Similar moderate diffuse gaseous distention of the colon, most prominent in the right colon. Favor adynamic ileus. Electronically Signed   By: Delbert Phenix M.D.   On: 07/16/2023 16:56   DG Abd 1 View  Result Date: 07/15/2023 CLINICAL DATA:  Follow-up bowel obstruction. EXAM: ABDOMEN - 1 VIEW COMPARISON:  CT from 07/14/2023 FINDINGS: Gaseous distension of the colon up to the level of the splenic flexure is again noted and appears similar to the exam from 07/14/2023. Beyond this level, the colon appears decreased in caliber but contains gas up to the rectum. Scattered air-filled loops of small bowel are noted which measure up to 2.9 cm. IMPRESSION: 1. Persistent gaseous distension of the colon up to the level of the splenic flexure. Beyond this level, the colon appears decreased in caliber but contains gas up to the rectum. Imaging findings likely  reflect colitis involving the left colon as demonstrated on the prior day CT. Electronically Signed   By: Signa Kell M.D.   On: 07/15/2023 16:31   CT Angio Chest PE W and/or Wo Contrast  Result Date: 07/14/2023 CLINICAL DATA:  Recent left knee replacement. Concern for pulmonary embolism and bowel obstruction. EXAM: CT ANGIOGRAPHY CHEST CT ABDOMEN AND PELVIS WITH CONTRAST TECHNIQUE: Multidetector CT imaging of the chest was performed using the standard protocol during bolus administration of intravenous contrast. Multiplanar CT image reconstructions and MIPs were obtained to evaluate the vascular anatomy. Multidetector CT imaging of the abdomen and pelvis was performed using the standard protocol during bolus administration of intravenous contrast. RADIATION DOSE REDUCTION: This exam was performed according to the departmental dose-optimization program which includes automated exposure control, adjustment of the mA and/or kV according to patient size and/or use of iterative reconstruction technique. CONTRAST:  OMNIPAQUE IOHEXOL 350 MG/ML SOLN COMPARISON:  Chest radiograph dated 09/05/2022. FINDINGS: Evaluation of this exam is limited due to respiratory motion artifact. CTA CHEST FINDINGS Cardiovascular: There is no cardiomegaly or pericardial effusion. There is 3 vessel coronary vascular calcification. Mild atherosclerotic calcification of the thoracic aorta. No aneurysmal dilatation or dissection. The origins of the great vessels of the aortic arch appear patent. Evaluation of the pulmonary arteries is limited due to severe respiratory motion. No central pulmonary artery embolus identified. Mediastinum/Nodes: No hilar or mediastinal adenopathy. The esophagus is grossly unremarkable. No mediastinal fluid collection. Lungs/Pleura: Bilateral interstitial streaky atelectasis. No focal consolidation, pleural effusion, or pneumothorax. The central airways are patent. Musculoskeletal: No acute osseous  pathology. Review of the MIP images confirms the above findings. CT ABDOMEN and PELVIS FINDINGS No intra-abdominal free air.  Trace free fluid in the pelvis. Hepatobiliary: The liver is unremarkable. No biliary dilatation with the gallbladder is unremarkable. Pancreas: Unremarkable. No pancreatic ductal dilatation or surrounding inflammatory changes. Spleen: The spleen is unremarkable. Adrenals/Urinary Tract: The adrenal glands are unremarkable. There is no hydronephrosis on either side. There is symmetric enhancement and excretion of contrast by both kidneys. The visualized ureters and urinary bladder appear unremarkable. Stomach/Bowel: There is severe sigmoid diverticulosis as well as additional scattered colonic diverticula. There is diffuse inflammatory changes of the distal colon extending from the mid transverse colon to the sigmoid colon consistent  with colitis. Loose stool throughout the colon consistent with diarrheal state. There is no bowel obstruction. The appendix is normal. Vascular/Lymphatic: Moderate aortoiliac atherosclerotic disease. The IVC is unremarkable. No portal venous gas. There is no adenopathy. Reproductive: Mildly enlarged prostate gland measuring 4.5 cm in transaxial diameter. The seminal vesicles are symmetric. Other: There is edema as well as small pockets of air in the visualized upper left thigh, likely related to recent surgery. No fluid collection. Musculoskeletal: Bilateral L5 pars defects. There is degenerative changes of the lumbar spine. No acute osseous pathology. Review of the MIP images confirms the above findings. IMPRESSION: 1. No acute intrathoracic pathology. No CT evidence of central pulmonary artery embolus. 2. Colitis of the distal colon. No bowel obstruction. Normal appendix. 3. Severe sigmoid diverticulosis. 4.  Aortic Atherosclerosis (ICD10-I70.0). Electronically Signed   By: Elgie Collard M.D.   On: 07/14/2023 20:11   CT ABDOMEN PELVIS W CONTRAST  Result  Date: 07/14/2023 CLINICAL DATA:  Recent left knee replacement. Concern for pulmonary embolism and bowel obstruction. EXAM: CT ANGIOGRAPHY CHEST CT ABDOMEN AND PELVIS WITH CONTRAST TECHNIQUE: Multidetector CT imaging of the chest was performed using the standard protocol during bolus administration of intravenous contrast. Multiplanar CT image reconstructions and MIPs were obtained to evaluate the vascular anatomy. Multidetector CT imaging of the abdomen and pelvis was performed using the standard protocol during bolus administration of intravenous contrast. RADIATION DOSE REDUCTION: This exam was performed according to the departmental dose-optimization program which includes automated exposure control, adjustment of the mA and/or kV according to patient size and/or use of iterative reconstruction technique. CONTRAST:  OMNIPAQUE IOHEXOL 350 MG/ML SOLN COMPARISON:  Chest radiograph dated 09/05/2022. FINDINGS: Evaluation of this exam is limited due to respiratory motion artifact. CTA CHEST FINDINGS Cardiovascular: There is no cardiomegaly or pericardial effusion. There is 3 vessel coronary vascular calcification. Mild atherosclerotic calcification of the thoracic aorta. No aneurysmal dilatation or dissection. The origins of the great vessels of the aortic arch appear patent. Evaluation of the pulmonary arteries is limited due to severe respiratory motion. No central pulmonary artery embolus identified. Mediastinum/Nodes: No hilar or mediastinal adenopathy. The esophagus is grossly unremarkable. No mediastinal fluid collection. Lungs/Pleura: Bilateral interstitial streaky atelectasis. No focal consolidation, pleural effusion, or pneumothorax. The central airways are patent. Musculoskeletal: No acute osseous pathology. Review of the MIP images confirms the above findings. CT ABDOMEN and PELVIS FINDINGS No intra-abdominal free air.  Trace free fluid in the pelvis. Hepatobiliary: The liver is unremarkable. No  biliary dilatation with the gallbladder is unremarkable. Pancreas: Unremarkable. No pancreatic ductal dilatation or surrounding inflammatory changes. Spleen: The spleen is unremarkable. Adrenals/Urinary Tract: The adrenal glands are unremarkable. There is no hydronephrosis on either side. There is symmetric enhancement and excretion of contrast by both kidneys. The visualized ureters and urinary bladder appear unremarkable. Stomach/Bowel: There is severe sigmoid diverticulosis as well as additional scattered colonic diverticula. There is diffuse inflammatory changes of the distal colon extending from the mid transverse colon to the sigmoid colon consistent with colitis. Loose stool throughout the colon consistent with diarrheal state. There is no bowel obstruction. The appendix is normal. Vascular/Lymphatic: Moderate aortoiliac atherosclerotic disease. The IVC is unremarkable. No portal venous gas. There is no adenopathy. Reproductive: Mildly enlarged prostate gland measuring 4.5 cm in transaxial diameter. The seminal vesicles are symmetric. Other: There is edema as well as small pockets of air in the visualized upper left thigh, likely related to recent surgery. No fluid collection. Musculoskeletal: Bilateral L5 pars defects.  There is degenerative changes of the lumbar spine. No acute osseous pathology. Review of the MIP images confirms the above findings. IMPRESSION: 1. No acute intrathoracic pathology. No CT evidence of central pulmonary artery embolus. 2. Colitis of the distal colon. No bowel obstruction. Normal appendix. 3. Severe sigmoid diverticulosis. 4.  Aortic Atherosclerosis (ICD10-I70.0). Electronically Signed   By: Elgie Collard M.D.   On: 07/14/2023 20:11    Disposition: Discharge disposition: 01-Home or Self Care       Discharge Instructions     Call MD / Call 911   Complete by: As directed    If you experience chest pain or shortness of breath, CALL 911 and be transported to the  hospital emergency room.  If you develope a fever above 101 F, pus (white drainage) or increased drainage or redness at the wound, or calf pain, call your surgeon's office.   Change dressing   Complete by: As directed    You may remove the bulky bandage (ACE wrap and gauze) two days after surgery. You will have an adhesive waterproof bandage underneath. Leave this in place until your first follow-up appointment.   Constipation Prevention   Complete by: As directed    Drink plenty of fluids.  Prune juice may be helpful.  You may use a stool softener, such as Colace (over the counter) 100 mg twice a day.  Use MiraLax (over the counter) for constipation as needed.   Diet - low sodium heart healthy   Complete by: As directed    Do not put a pillow under the knee. Place it under the heel.   Complete by: As directed    Driving restrictions   Complete by: As directed    No driving for two weeks   Post-operative opioid taper instructions:   Complete by: As directed    POST-OPERATIVE OPIOID TAPER INSTRUCTIONS: It is important to wean off of your opioid medication as soon as possible. If you do not need pain medication after your surgery it is ok to stop day one. Opioids include: Codeine, Hydrocodone(Norco, Vicodin), Oxycodone(Percocet, oxycontin) and hydromorphone amongst others.  Long term and even short term use of opiods can cause: Increased pain response Dependence Constipation Depression Respiratory depression And more.  Withdrawal symptoms can include Flu like symptoms Nausea, vomiting And more Techniques to manage these symptoms Hydrate well Eat regular healthy meals Stay active Use relaxation techniques(deep breathing, meditating, yoga) Do Not substitute Alcohol to help with tapering If you have been on opioids for less than two weeks and do not have pain than it is ok to stop all together.  Plan to wean off of opioids This plan should start within one week post op of your joint  replacement. Maintain the same interval or time between taking each dose and first decrease the dose.  Cut the total daily intake of opioids by one tablet each day Next start to increase the time between doses. The last dose that should be eliminated is the evening dose.      TED hose   Complete by: As directed    Use stockings (TED hose) for three weeks on both leg(s).  You may remove them at night for sleeping.   Weight bearing as tolerated   Complete by: As directed         Follow-up Information     Ollen Gross, MD. Go on 07/27/2023.   Specialty: Orthopedic Surgery Why: You are scheduled for first post op appt on Wednesday August  28 at 2:15pm. Contact information: [address] [place] [address] [phone]                  Signed: Arther Abbott 07/18/2023, 11:33 AM

## 2023-07-18 NOTE — Progress Notes (Signed)
   07/18/23 0100  BiPAP/CPAP/SIPAP  Reason BIPAP/CPAP not in use Other(comment) (patient states he does not wear a cpap)  BiPAP/CPAP /SiPAP Vitals  Pulse Rate 73  Resp 16  BP (!) 123/41  SpO2 91 %  MEWS Score/Color  MEWS Score 0  MEWS Score Color Green

## 2023-07-18 NOTE — Progress Notes (Signed)
Physical Therapy Treatment Patient Details Name: David Goodman MRN: [id number] DOB: October 16, 1941 Today's Date: 07/18/2023   History of Present Illness Pt s/p L TKR 07/11/23 and now readmitted from home with sepsis, post-op ileus, and sirs.    PT Comments  Pt is progressing; improvements in knee ROM and LLE strength. Feels fatigued this pm. Continue PT in acute setting   If plan is discharge home, recommend the following: A little help with walking and/or transfers;A little help with bathing/dressing/bathroom;Assistance with cooking/housework;Assist for transportation;Help with stairs or ramp for entrance   Can travel by private vehicle        Equipment Recommendations  None recommended by PT    Recommendations for Other Services       Precautions / Restrictions Precautions Precautions: Fall;Knee Restrictions Weight Bearing Restrictions: No LLE Weight Bearing: Weight bearing as tolerated     Mobility  Bed Mobility Overal bed mobility: Needs Assistance Bed Mobility: Sit to Supine       Sit to supine: Min assist   General bed mobility comments: cues to self assist, incr time, assist to lift LLE into bed    Transfers Overall transfer level: Needs assistance Equipment used: Rolling walker (2 wheels) Transfers: Sit to/from Stand, Bed to chair/wheelchair/BSC Sit to Stand: Min assist, Contact guard assist           General transfer comment: cues for LE management and use of UEs to self assist.    Ambulation/Gait Ambulation/Gait assistance: Min assist, Contact guard assist Gait Distance (Feet): 16 Feet Assistive device: Rolling walker (2 wheels) Gait Pattern/deviations: Step-to pattern, Decreased stance time - left Gait velocity: decr     General Gait Details: cues for posture and position from RW, one brief standing rest; dyspneic after amb.   Stairs             Wheelchair Mobility     Tilt Bed    Modified Rankin (Stroke Patients Only)        Balance                                            Cognition Arousal: Alert Behavior During Therapy: WFL for tasks assessed/performed Overall Cognitive Status: Within Functional Limits for tasks assessed                                          Exercises Total Joint Exercises Ankle Circles/Pumps: AROM, Both, 15 reps, Supine Quad Sets: AROM, Both, 10 reps Heel Slides: AAROM, Left, Supine, 15 reps Hip ABduction/ADduction: AROM, AAROM, Left, 15 reps, Supine Straight Leg Raises: Left, AAROM, 10 reps, Seated    General Comments        Pertinent Vitals/Pain Pain Assessment Pain Assessment: 0-10 Pain Score: 5  Pain Location: left knee Pain Descriptors / Indicators: Aching, Sore Pain Intervention(s): Limited activity within patient's tolerance, Monitored during session, Repositioned    Home Living                          Prior Function            PT Goals (current goals can now be found in the care plan section) Acute Rehab PT Goals Patient Stated Goal: Regain IND PT Goal Formulation: With patient Time For Goal  Achievement: 07/18/23 Potential to Achieve Goals: Good    Frequency    7X/week      PT Plan      Co-evaluation              AM-PAC PT "6 Clicks" Mobility   Outcome Measure  Help needed turning from your back to your side while in a flat bed without using bedrails?: A Little Help needed moving from lying on your back to sitting on the side of a flat bed without using bedrails?: A Little Help needed moving to and from a bed to a chair (including a wheelchair)?: A Little Help needed standing up from a chair using your arms (e.g., wheelchair or bedside chair)?: A Little Help needed to walk in hospital room?: A Little Help needed climbing 3-5 steps with a railing? : A Lot 6 Click Score: 17    End of Session Equipment Utilized During Treatment: Gait belt Activity Tolerance: Patient tolerated treatment  well   Nurse Communication: Mobility status PT Visit Diagnosis: Difficulty in walking, not elsewhere classified (R26.2)     Time: 4401-0272 PT Time Calculation (min) (ACUTE ONLY): 17 min  Charges:    $Therapeutic Exercise: 8-22 mins PT General Charges $$ ACUTE PT VISIT: 1 Visit                     Delice Bison, PT  Acute Rehab Dept [facility]) [phone]  07/18/2023    [facility] 07/18/2023, 5:43 PM

## 2023-07-18 NOTE — Progress Notes (Signed)
PROGRESS NOTE David Goodman  ZOX:[id number] DOB: 04-28-1941 DOA: 07/14/2023 PCP: Loyola Mast, MD  Brief Narrative/Hospital Course: 82 year old with chronic constipation, hypertension, coronary artery disease who underwent elective left total knee arthroplasty on 8/12.  No bowel movement since then.  Ongoing constipation not relieved by laxatives at home.  Started having nausea vomiting and abdominal distention and discomfort came to ER.  In the emergency room dehydrated.  WC count 19,000.  Tachycardic.  CT scan of the abdomen pelvis with stool burden, colitis, known diverticulosis. Vitals stable afebrile on admission.  Labs showed hyponatremia mild hypokalemia stable LFTs with T. bili 1.7 lactic acidosis up to 2.6, leukocytosis 18.9 UA unremarkable. Patient fluid resuscitated placed on IV vancomycin, Zosyn and admitted.   Subjective: Patient seen and examined.  Denies any nausea.  Belly is still distended.  He had multiple small volume stools.  Afebrile.  Mobilizing.  Blood pressures stable.  Assessment and Plan: Principal Problem:   SIRS (systemic inflammatory response syndrome) (HCC) Active Problems:   Postoperative ileus (HCC)   Essential hypertension   OSA on CPAP   Sepsis (HCC)   Assessment and Plan:  SIRS: With tachycardia tachypnea leukocytosis, hypotension, lactic acidosis Colitis of distal colon Severe sigmoid diverticulosis Constipation Suspected post op ileus: CT angio chest, chest abdomen pelvis on admission severe sigmoid diverticulosis, colitis of the distal colon no bowel obstruction no intrathoracic pathology and patient met SIRS criteria with tachypnea leukocytosis lactic acidosis.  Initially with constipation, treated with Dulcolax suppository and now with multiple bowel movements. Tolerating clears, will advance to full liquid diet today. KUB today shows distention but adequate air in the distal colon. Blood cultures negative so far. On vancomycin and Zosyn.  Will  discontinue vancomycin.  Continue Zosyn to treat colitis until clinical improvement.  Recent Labs  Lab 07/14/23 1637 07/14/23 2203 07/14/23 2355 07/15/23 0158 07/15/23 0442 07/16/23 0228 07/17/23 0313 07/18/23 0309  WBC 18.9*  --   --   --  16.8* 14.8* 11.2* 8.9  LATICACIDVEN  --  2.4* 2.5* 2.6* 2.1*  --   --   --   PROCALCITON  --   --   --   --  2.06  --   --   --     OSA on CPAP Noncompliant with CPAP   Essential hypertension BP Controlled. cont holding home meds   Hypokalemia: Replace with oral potassium.  Check magnesium and phosphorus.  Also keep on potassium containing IV fluids. Recent Labs  Lab 07/15/23 0442 07/16/23 0228 07/16/23 1529 07/17/23 0313 07/18/23 0309  K 3.6 2.7* 3.3* 3.2* 3.1*    Hyponatremia: Sodium improving, cont gentle IV hydration Recent Labs  Lab 07/15/23 0442 07/16/23 0228 07/16/23 1529 07/17/23 0313 07/18/23 0309  NA 130* 133* 132* 133* 133*   Normocytic anemia: Previous baseline hemoglobin Variable as low as 8.3-9 in May 2023 but at one point in 12.  Monitor for any acute bleeding. Recent Labs  Lab 07/14/23 1637 07/15/23 0442 07/16/23 0228 07/17/23 0313 07/18/23 0309  HGB 12.5* 10.2* 9.1* 8.5* 9.3*  HCT 39.8 31.0* 27.3* 26.1* 28.1*    Recent Lt knee surgery- TKA Lt on  07/10/24 Dr Despina Hick for OA: Aquacel dressing + on left knee.  No significant tenderness on exam. Cont PTOT  Anxiety: Patient reports he takes Klonopin 0.5 mg twice daily as needed.  Continued.  Class I Obesity:Patient's Body mass index is 31.48 kg/m. : Will benefit with PCP follow-up, weight loss  healthy lifestyle and outpatient sleep evaluation.  DVT prophylaxis: enoxaparin (LOVENOX) injection 40 mg Start: 07/15/23 1000 Code Status:   Code Status: Full Code Family Communication: none today. Patient status is: Inpatient because of SIRS Level of care: Stepdown   Dispo: The patient is from: home w/ wife            Anticipated disposition: TBD. PTOT as  able.  can transfer to MedSurg bed.  Objective: Vitals last 24 hrs: Vitals:   07/18/23 1009 07/18/23 1021 07/18/23 1100 07/18/23 1139  BP:  (!) 177/66 (!) 173/68   Pulse: 98 85 92   Resp: (!) 25 (!) 22 (!) 21   Temp:    97.7 F (36.5 C)  TempSrc:    Oral  SpO2: 97% 96% 100%   Weight:      Height:       Weight change:   Physical Examination: General: Alert awake and oriented.  In mild distress and anxious. Cardiovascular: S1-S2 normal.  Regular rate rhythm. Respiratory: Bilateral clear.  No added sounds. Gastrointestinal: Soft distended.  Nontender.  Bowel sound present. Ext: No swelling or edema.  Left knee postoperative. Neuro: Intact.   Medications reviewed:  Scheduled Meds:  Chlorhexidine Gluconate Cloth  6 each Topical Daily   enoxaparin (LOVENOX) injection  40 mg Subcutaneous Q24H   latanoprost  1 drop Both Eyes QHS   pantoprazole  40 mg Oral Daily   potassium chloride  40 mEq Oral BID   tamsulosin  0.4 mg Oral Daily   Continuous Infusions:  lactated ringers 1,000 mL with potassium chloride 40 mEq infusion 75 mL/hr at 07/18/23 1117   piperacillin-tazobactam (ZOSYN)  IV 3.375 g (07/18/23 1139)    Diet Order             Diet full liquid Fluid consistency: Thin  Diet effective now                   Intake/Output Summary (Last 24 hours) at 07/18/2023 1316 Last data filed at 07/18/2023 1117 Gross per 24 hour  Intake 3808.9 ml  Output 200 ml  Net 3608.9 ml   Net IO Since Admission: 14,464.59 mL [07/18/23 1316]  Wt Readings from Last 3 Encounters:  07/15/23 83.2 kg  07/11/23 78 kg  07/01/23 78 kg     Unresulted Labs (From admission, onward)     Start     Ordered   07/19/23 0500  Magnesium  Tomorrow morning,   R       Question:  Specimen collection method  Answer:  Lab=Lab collect   07/18/23 1309   07/19/23 0500  Phosphorus  Tomorrow morning,   R       Question:  Specimen collection method  Answer:  Lab=Lab collect   07/18/23 1309   07/17/23 0500   CBC  Daily,   R      07/16/23 0720   07/17/23 0500  Basic metabolic panel  Daily,   R     Question:  Specimen collection method  Answer:  Lab=Lab collect   07/16/23 1007          Data Reviewed: I have personally reviewed following labs and imaging studies CBC: Recent Labs  Lab 07/14/23 1637 07/15/23 0442 07/16/23 0228 07/17/23 0313 07/18/23 0309  WBC 18.9* 16.8* 14.8* 11.2* 8.9  NEUTROABS 17.5*  --  12.6*  --   --   HGB 12.5* 10.2* 9.1* 8.5* 9.3*  HCT 39.8 31.0* 27.3* 26.1* 28.1*  MCV 93.0 88.3 88.6 88.2 87.0  PLT 228 250  235 233 272   Basic Metabolic Panel: Recent Labs  Lab 07/15/23 0442 07/16/23 0228 07/16/23 1529 07/17/23 0313 07/18/23 0309  NA 130* 133* 132* 133* 133*  K 3.6 2.7* 3.3* 3.2* 3.1*  CL 94* 99 102 104 103  CO2 23 25 22  20* 21*  GLUCOSE 122* 117* 91 87 101*  BUN 26* 21 21 19 14   CREATININE 1.02 0.87 0.86 0.77 0.75  CALCIUM 7.8* 7.3* 7.4* 7.2* 7.5*  MG  --  2.7* 2.4  --  2.0  PHOS  --  2.7  --   --   --    GFR: Estimated Creatinine Clearance: 70.5 mL/min (by C-G formula based on SCr of 0.75 mg/dL). Liver Function Tests: Recent Labs  Lab 07/14/23 1840 07/15/23 0442  AST 25 32  ALT 18 17  ALKPHOS 49 49  BILITOT 1.7* 1.4*  PROT 6.0* 6.0*  ALBUMIN 3.5 3.1*   Recent Labs  Lab 07/15/23 0442  INR 1.3*   Sepsis Labs: Recent Labs  Lab 07/14/23 2203 07/14/23 2355 07/15/23 0158 07/15/23 0442  PROCALCITON  --   --   --  2.06  LATICACIDVEN 2.4* 2.5* 2.6* 2.1*    Recent Results (from the past 240 hour(s))  SARS Coronavirus 2 by RT PCR (hospital order, performed in [facility] hospital lab) *cepheid single result test* Anterior Nasal Swab     Status: None   Collection Time: 07/14/23  4:37 PM   Specimen: Anterior Nasal Swab  Result Value Ref Range Status   SARS Coronavirus 2 by RT PCR NEGATIVE NEGATIVE Final    Comment: (NOTE) SARS-CoV-2 target nucleic acids are NOT DETECTED.  The SARS-CoV-2 RNA is generally detectable in upper and  lower respiratory specimens during the acute phase of infection. The lowest concentration of SARS-CoV-2 viral copies this assay can detect is 250 copies / mL. A negative result does not preclude SARS-CoV-2 infection and should not be used as the sole basis for treatment or other patient management decisions.  A negative result may occur with improper specimen collection / handling, submission of specimen other than nasopharyngeal swab, presence of viral mutation(s) within the areas targeted by this assay, and inadequate number of viral copies (<250 copies / mL). A negative result must be combined with clinical observations, patient history, and epidemiological information.  Fact Sheet for Patients:   RoadLapTop.co.za  Fact Sheet for Healthcare Providers: http://kim-miller.com/  This test is not yet approved or  cleared by the [place] FDA and has been authorized for detection and/or diagnosis of SARS-CoV-2 by FDA under an Emergency Use Authorization (EUA).  This EUA will remain in effect (meaning this test can be used) for the duration of the COVID-19 declaration under Section 564(b)(1) of the Act, 21 U.S.C. section 360bbb-3(b)(1), unless the authorization is terminated or revoked sooner.  Performed at [facility], [address] [place], [place] [address]   Urine Culture     Status: None   Collection Time: 07/14/23  9:18 PM   Specimen: Urine, Clean Catch  Result Value Ref Range Status   Specimen Description   Final    URINE, CLEAN CATCH Performed at [facility], [address], [place], [place] [address]    Special Requests   Final    NONE Performed at [facility], [address], [place], [place] [address]    Culture   Final    NO GROWTH Performed at [facility], [address], [place],  [place] [address]    Report Status 07/16/2023 FINAL  Final  Blood  culture (routine x 2)     Status: None (Preliminary result)   Collection Time: 07/14/23  9:47 PM   Specimen: BLOOD  Result Value Ref Range Status   Specimen Description   Final    BLOOD BLOOD RIGHT FOREARM Performed at [facility], [address] [place], [place] [address]    Special Requests   Final    BOTTLES DRAWN AEROBIC AND ANAEROBIC Blood Culture adequate volume Performed at [facility], [address] [place], [place] [address]    Culture   Final    NO GROWTH 4 DAYS Performed at [facility], [address], [place], [place] [address]    Report Status PENDING  Incomplete  Blood culture (routine x 2)     Status: None (Preliminary result)   Collection Time: 07/14/23  9:51 PM   Specimen: BLOOD  Result Value Ref Range Status   Specimen Description   Final    BLOOD BLOOD RIGHT FOREARM Performed at [facility], [address] [place], [place] [address]    Special Requests   Final    BOTTLES DRAWN AEROBIC AND ANAEROBIC Blood Culture adequate volume Performed at [facility], [address] [place], [place] [address]    Culture   Final    NO GROWTH 4 DAYS Performed at [facility], [address] [place], [place] [address]    Report Status PENDING  Incomplete    Antimicrobials: Anti-infectives (From admission, onward)    Start     Dose/Rate Route Frequency Ordered Stop   07/16/23 2200  vancomycin (VANCOCIN) IVPB 1000 mg/200 mL premix  Status:  Discontinued        1,000 mg 200 mL/hr over 60 Minutes Intravenous Every 24 hours 07/16/23 0751 07/18/23 0840   07/16/23 0000  vancomycin (VANCOREADY) IVPB 1250 mg/250 mL  Status:  Discontinued        1,250 mg 166.7 mL/hr over 90 Minutes Intravenous Every 24 hours 07/15/23 0011 07/16/23 0751   07/15/23 0400  piperacillin-tazobactam (ZOSYN) IVPB 3.375 g        3.375 g 12.5 mL/hr over 240 Minutes Intravenous Every 8 hours 07/15/23 0011      07/14/23 2245  vancomycin (VANCOREADY) IVPB 1500 mg/300 mL        1,500 mg 150 mL/hr over 120 Minutes Intravenous  Once 07/14/23 2239 07/15/23 0140   07/14/23 2145  piperacillin-tazobactam (ZOSYN) IVPB 3.375 g        3.375 g 100 mL/hr over 30 Minutes Intravenous  Once 07/14/23 2143 07/14/23 2234      Culture/Microbiology    Component Value Date/Time   SDES  07/14/2023 2151    BLOOD BLOOD RIGHT FOREARM Performed at [facility], [address], [place], [place] [address]    SPECREQUEST  07/14/2023 2151    BOTTLES DRAWN AEROBIC AND ANAEROBIC Blood Culture adequate volume Performed at [facility], [address], [place], [place] [address]    CULT  07/14/2023 2151    NO GROWTH 4 DAYS Performed at [facility], [address], [place], [place] [address]    REPTSTATUS PENDING 07/14/2023 2151    Other culture-see note  Radiology Studies: DG Abd 1 View  Result Date: 07/18/2023 CLINICAL DATA:  Ileus. EXAM: ABDOMEN - 1 VIEW COMPARISON:  July 17, 2023. FINDINGS: Mildly dilated and air-filled cecum is  noted at approximately 10 cm. No other abnormal bowel dilatation is noted. IMPRESSION: Mildly dilated and air-filled cecum is noted most consistent with ileus. Electronically Signed   By: Lupita Raider M.D.   On: 07/18/2023 09:33   DG Abd 1 View  Result Date: 07/17/2023 CLINICAL DATA:  Ileus EXAM: ABDOMEN - 1 VIEW COMPARISON:  Yesterday FINDINGS: Diffuse gaseous distension of colon and small bowel with mild improvement in the degree of distension. Gas still reaches the rectum. No concerning mass effect or calcification. IMPRESSION: Mild improvement in ileus pattern.  No new abnormality. Electronically Signed   By: Tiburcio Pea M.D.   On: 07/17/2023 12:55   DG Abd 1 View  Result Date: 07/16/2023 CLINICAL DATA:  Abdominal distension, recent knee surgery EXAM: ABDOMEN - 1 VIEW COMPARISON:  07/15/2023 abdominal radiographs FINDINGS:  Gas-filled nondilated small bowel loops throughout central abdomen, similar. Moderate diffuse gaseous distention of the colon, most prominent in the right colon, similar. No evidence of pneumatosis or pneumoperitoneum. Mild lumbar spondylosis. No radiopaque nephrolithiasis. IMPRESSION: Nonobstructive bowel gas pattern. Similar moderate diffuse gaseous distention of the colon, most prominent in the right colon. Favor adynamic ileus. Electronically Signed   By: Delbert Phenix M.D.   On: 07/16/2023 16:56     LOS: 3 days   Total time spent: 35 minutes  Dorcas Carrow, MD [facility]  07/18/2023, 1:16 PM

## 2023-07-18 NOTE — Progress Notes (Signed)
   07/18/23 1950  BiPAP/CPAP/SIPAP  Reason BIPAP/CPAP not in use Non-compliant   Pt refusing cpap for the night.

## 2023-07-19 DIAGNOSIS — R6510 Systemic inflammatory response syndrome (SIRS) of non-infectious origin without acute organ dysfunction: Secondary | ICD-10-CM | POA: Diagnosis not present

## 2023-07-19 LAB — CBC
HCT: 26.1 % — ABNORMAL LOW (ref 39.0–52.0)
Hemoglobin: 8.8 g/dL — ABNORMAL LOW (ref 13.0–17.0)
MCH: 28.9 pg (ref 26.0–34.0)
MCHC: 33.7 g/dL (ref 30.0–36.0)
MCV: 85.6 fL (ref 80.0–100.0)
Platelets: 286 10*3/uL (ref 150–400)
RBC: 3.05 MIL/uL — ABNORMAL LOW (ref 4.22–5.81)
RDW: 14.3 % (ref 11.5–15.5)
WBC: 9 10*3/uL (ref 4.0–10.5)
nRBC: 0 % (ref 0.0–0.2)

## 2023-07-19 LAB — CULTURE, BLOOD (ROUTINE X 2)
Culture: NO GROWTH
Culture: NO GROWTH
Special Requests: ADEQUATE
Special Requests: ADEQUATE

## 2023-07-19 LAB — BASIC METABOLIC PANEL
Anion gap: 7 (ref 5–15)
BUN: 8 mg/dL (ref 8–23)
CO2: 23 mmol/L (ref 22–32)
Calcium: 7.5 mg/dL — ABNORMAL LOW (ref 8.9–10.3)
Chloride: 104 mmol/L (ref 98–111)
Creatinine, Ser: 0.81 mg/dL (ref 0.61–1.24)
GFR, Estimated: >60 mL/min (ref >60)
Glucose, Bld: 106 mg/dL — ABNORMAL HIGH (ref 70–99)
Potassium: 3.6 mmol/L (ref 3.5–5.1)
Sodium: 134 mmol/L — ABNORMAL LOW (ref 135–145)

## 2023-07-19 LAB — MAGNESIUM: Magnesium: 1.9 mg/dL (ref 1.7–2.4)

## 2023-07-19 LAB — PHOSPHORUS: Phosphorus: 2.3 mg/dL — ABNORMAL LOW (ref 2.5–4.6)

## 2023-07-19 MED ORDER — CLONAZEPAM 0.5 MG PO TBDP: 0.5000 mg | ORAL_TABLET | Freq: Two times a day (BID) | ORAL | 0 refills | Status: DC | PRN

## 2023-07-19 MED ORDER — K PHOS MONO-SOD PHOS DI & MONO 155-852-130 MG PO TABS: 500.0000 mg | ORAL_TABLET | Freq: Two times a day (BID) | ORAL | 0 refills | Status: AC

## 2023-07-19 NOTE — Discharge Summary (Signed)
Physician Discharge Summary  David Goodman ZOX:[id number] DOB: Apr 10, 1941 DOA: 07/14/2023  PCP: Loyola Mast, MD  Admit date: 07/14/2023 Discharge date: 07/19/2023  Admitted From: Home Disposition: Home  Recommendations for Outpatient Follow-up:  Follow up with PCP in 1-2 weeks Please obtain BMP/CBC/magnesium/phosphorus in one week Follow-up with orthopedics as already scheduled  Home Health: N/A Equipment/Devices: N/A  Discharge Condition: Stable CODE STATUS: Full code Diet recommendation: Low-salt diet, avoid constipation  Discharge summary: 82 year old with chronic constipation, hypertension, coronary artery disease who underwent elective left total knee arthroplasty on 8/12.  No bowel movement since then.  Ongoing constipation not relieved by laxatives at home.  Started having nausea vomiting and abdominal distention and discomfort came to ER.  In the emergency room dehydrated.  WC count 19,000.  Tachycardic.  CT scan of the abdomen pelvis with stool burden, colitis, known diverticulosis. Vitals stable afebrile on admission.  Labs showed hyponatremia mild hypokalemia stable LFTs with T. bili 1.7 lactic acidosis up to 2.6, leukocytosis 18.9 UA unremarkable. Patient fluid resuscitated placed on IV vancomycin, Zosyn and admitted.  Patient remained in the hospital due to significant abdominal distention, constipation and paralytic ileus.  He was treated symptomatically with IV fluid, gradual advancement of diet, bowel regimen.  Cultures are negative.  After relieving his constipation, he had ongoing diarrhea that has been improving now.  CT scan showed some distal colitis with known history of diverticulosis.  Due to adequate clinical recovery, he is going home today.  He will avoid using any antidiarrheals.  He will decrease use of narcotics.  Patient will go back on using Metamucil every day.  Surgical follow-up as previously planned.  Short course of potassium phosphate  ordered.  Patient has significant anxiety issues.  He had 60 tablets of clonazepam 0.5 mg twice daily that he uses as needed since last many years.  He requested for some clonazepam tablets.  Prescribed 30 tablets.  Stable for discharge.   Discharge Diagnoses:  Principal Problem:   SIRS (systemic inflammatory response syndrome) (HCC) Active Problems:   Postoperative ileus (HCC)   Essential hypertension   OSA on CPAP   Sepsis [facility])    Discharge Instructions  Discharge Instructions     Call MD for:  persistant nausea and vomiting   Complete by: As directed    Call MD for:  severe uncontrolled pain   Complete by: As directed    Diet general   Complete by: As directed    Increase activity slowly   Complete by: As directed       Allergies as of 07/19/2023   No Known Allergies      Medication List     TAKE these medications    aspirin 81 MG chewable tablet Chew 1 tablet (81 mg total) by mouth 2 (two) times daily for 20 days. Then resume one 81 mg aspirin once a day What changed: Another medication with the same name was removed. Continue taking this medication, and follow the directions you see here.   B-12 PO Take 1 tablet by mouth daily.   clonazePAM 0.5 MG disintegrating tablet Commonly known as: KLONOPIN Take 1 tablet (0.5 mg total) by mouth 2 (two) times daily as needed for up to 15 days (Anxiety).   hydrochlorothiazide 25 MG tablet Commonly known as: HYDRODIURIL Take 1 tablet (25 mg total) by mouth daily.   latanoprost 0.005 % ophthalmic solution Commonly known as: XALATAN Place 1 drop into both eyes at bedtime.   MAGNESIUM PO Take 400  mg by mouth at bedtime.   methocarbamol 500 MG tablet Commonly known as: ROBAXIN Take 1 tablet (500 mg total) by mouth every 6 (six) hours as needed for muscle spasms.   nitroGLYCERIN 0.4 MG SL tablet Commonly known as: NITROSTAT Place 1 tablet (0.4 mg total) under the tongue every 5 (five) minutes as needed for  chest pain.   ondansetron 4 MG tablet Commonly known as: ZOFRAN Take 1 tablet (4 mg total) by mouth every 6 (six) hours as needed for nausea. What changed:  when to take this reasons to take this   oxyCODONE 5 MG immediate release tablet Commonly known as: Oxy IR/ROXICODONE Take 1-2 tablets (5-10 mg total) by mouth every 8 (eight) hours as needed for severe pain.   pantoprazole 40 MG tablet Commonly known as: PROTONIX Take 1 tablet by mouth once daily   phosphorus 155-852-130 MG tablet Commonly known as: K PHOS NEUTRAL Take 2 tablets (500 mg total) by mouth 2 (two) times daily for 15 days.   psyllium 58.6 % powder Commonly known as: METAMUCIL Take 1 packet by mouth daily.   REFRESH OP Place 1 drop into the left eye 2 (two) times daily as needed (dryness).   rosuvastatin 20 MG tablet Commonly known as: CRESTOR Take 1 tablet by mouth once daily   tadalafil 20 MG tablet Commonly known as: Cialis Take 1 tablet (20 mg total) by mouth daily as needed for erectile dysfunction.   tamsulosin 0.4 MG Caps capsule Commonly known as: FLOMAX TAKE 1 CAPSULE BY MOUTH DAILY   traMADol 50 MG tablet Commonly known as: ULTRAM Take 1-2 tablets (50-100 mg total) by mouth every 6 (six) hours as needed for moderate pain.   valsartan 320 MG tablet Commonly known as: DIOVAN Take 1 tablet by mouth once daily   vitamin C 1000 MG tablet Take 1,000 mg by mouth 2 (two) times a week.   zinc gluconate 50 MG tablet Take 50 mg by mouth daily.        No Known Allergies  Consultations: None.   Procedures/Studies: DG Abd 1 View  Result Date: 07/18/2023 CLINICAL DATA:  Ileus. EXAM: ABDOMEN - 1 VIEW COMPARISON:  July 17, 2023. FINDINGS: Mildly dilated and air-filled cecum is noted at approximately 10 cm. No other abnormal bowel dilatation is noted. IMPRESSION: Mildly dilated and air-filled cecum is noted most consistent with ileus. Electronically Signed   By: Lupita Raider M.D.   On:  07/18/2023 09:33   DG Abd 1 View  Result Date: 07/17/2023 CLINICAL DATA:  Ileus EXAM: ABDOMEN - 1 VIEW COMPARISON:  Yesterday FINDINGS: Diffuse gaseous distension of colon and small bowel with mild improvement in the degree of distension. Gas still reaches the rectum. No concerning mass effect or calcification. IMPRESSION: Mild improvement in ileus pattern.  No new abnormality. Electronically Signed   By: Tiburcio Pea M.D.   On: 07/17/2023 12:55   DG Abd 1 View  Result Date: 07/16/2023 CLINICAL DATA:  Abdominal distension, recent knee surgery EXAM: ABDOMEN - 1 VIEW COMPARISON:  07/15/2023 abdominal radiographs FINDINGS: Gas-filled nondilated small bowel loops throughout central abdomen, similar. Moderate diffuse gaseous distention of the colon, most prominent in the right colon, similar. No evidence of pneumatosis or pneumoperitoneum. Mild lumbar spondylosis. No radiopaque nephrolithiasis. IMPRESSION: Nonobstructive bowel gas pattern. Similar moderate diffuse gaseous distention of the colon, most prominent in the right colon. Favor adynamic ileus. Electronically Signed   By: Delbert Phenix M.D.   On: 07/16/2023 16:56  DG Abd 1 View  Result Date: 07/15/2023 CLINICAL DATA:  Follow-up bowel obstruction. EXAM: ABDOMEN - 1 VIEW COMPARISON:  CT from 07/14/2023 FINDINGS: Gaseous distension of the colon up to the level of the splenic flexure is again noted and appears similar to the exam from 07/14/2023. Beyond this level, the colon appears decreased in caliber but contains gas up to the rectum. Scattered air-filled loops of small bowel are noted which measure up to 2.9 cm. IMPRESSION: 1. Persistent gaseous distension of the colon up to the level of the splenic flexure. Beyond this level, the colon appears decreased in caliber but contains gas up to the rectum. Imaging findings likely reflect colitis involving the left colon as demonstrated on the prior day CT. Electronically Signed   By: Signa Kell M.D.    On: 07/15/2023 16:31   CT Angio Chest PE W and/or Wo Contrast  Result Date: 07/14/2023 CLINICAL DATA:  Recent left knee replacement. Concern for pulmonary embolism and bowel obstruction. EXAM: CT ANGIOGRAPHY CHEST CT ABDOMEN AND PELVIS WITH CONTRAST TECHNIQUE: Multidetector CT imaging of the chest was performed using the standard protocol during bolus administration of intravenous contrast. Multiplanar CT image reconstructions and MIPs were obtained to evaluate the vascular anatomy. Multidetector CT imaging of the abdomen and pelvis was performed using the standard protocol during bolus administration of intravenous contrast. RADIATION DOSE REDUCTION: This exam was performed according to the departmental dose-optimization program which includes automated exposure control, adjustment of the mA and/or kV according to patient size and/or use of iterative reconstruction technique. CONTRAST:  OMNIPAQUE IOHEXOL 350 MG/ML SOLN COMPARISON:  Chest radiograph dated 09/05/2022. FINDINGS: Evaluation of this exam is limited due to respiratory motion artifact. CTA CHEST FINDINGS Cardiovascular: There is no cardiomegaly or pericardial effusion. There is 3 vessel coronary vascular calcification. Mild atherosclerotic calcification of the thoracic aorta. No aneurysmal dilatation or dissection. The origins of the great vessels of the aortic arch appear patent. Evaluation of the pulmonary arteries is limited due to severe respiratory motion. No central pulmonary artery embolus identified. Mediastinum/Nodes: No hilar or mediastinal adenopathy. The esophagus is grossly unremarkable. No mediastinal fluid collection. Lungs/Pleura: Bilateral interstitial streaky atelectasis. No focal consolidation, pleural effusion, or pneumothorax. The central airways are patent. Musculoskeletal: No acute osseous pathology. Review of the MIP images confirms the above findings. CT ABDOMEN and PELVIS FINDINGS No intra-abdominal free air.  Trace  free fluid in the pelvis. Hepatobiliary: The liver is unremarkable. No biliary dilatation with the gallbladder is unremarkable. Pancreas: Unremarkable. No pancreatic ductal dilatation or surrounding inflammatory changes. Spleen: The spleen is unremarkable. Adrenals/Urinary Tract: The adrenal glands are unremarkable. There is no hydronephrosis on either side. There is symmetric enhancement and excretion of contrast by both kidneys. The visualized ureters and urinary bladder appear unremarkable. Stomach/Bowel: There is severe sigmoid diverticulosis as well as additional scattered colonic diverticula. There is diffuse inflammatory changes of the distal colon extending from the mid transverse colon to the sigmoid colon consistent with colitis. Loose stool throughout the colon consistent with diarrheal state. There is no bowel obstruction. The appendix is normal. Vascular/Lymphatic: Moderate aortoiliac atherosclerotic disease. The IVC is unremarkable. No portal venous gas. There is no adenopathy. Reproductive: Mildly enlarged prostate gland measuring 4.5 cm in transaxial diameter. The seminal vesicles are symmetric. Other: There is edema as well as small pockets of air in the visualized upper left thigh, likely related to recent surgery. No fluid collection. Musculoskeletal: Bilateral L5 pars defects. There is degenerative changes of the lumbar spine.  No acute osseous pathology. Review of the MIP images confirms the above findings. IMPRESSION: 1. No acute intrathoracic pathology. No CT evidence of central pulmonary artery embolus. 2. Colitis of the distal colon. No bowel obstruction. Normal appendix. 3. Severe sigmoid diverticulosis. 4.  Aortic Atherosclerosis (ICD10-I70.0). Electronically Signed   By: Elgie Collard M.D.   On: 07/14/2023 20:11   CT ABDOMEN PELVIS W CONTRAST  Result Date: 07/14/2023 CLINICAL DATA:  Recent left knee replacement. Concern for pulmonary embolism and bowel obstruction. EXAM: CT  ANGIOGRAPHY CHEST CT ABDOMEN AND PELVIS WITH CONTRAST TECHNIQUE: Multidetector CT imaging of the chest was performed using the standard protocol during bolus administration of intravenous contrast. Multiplanar CT image reconstructions and MIPs were obtained to evaluate the vascular anatomy. Multidetector CT imaging of the abdomen and pelvis was performed using the standard protocol during bolus administration of intravenous contrast. RADIATION DOSE REDUCTION: This exam was performed according to the departmental dose-optimization program which includes automated exposure control, adjustment of the mA and/or kV according to patient size and/or use of iterative reconstruction technique. CONTRAST:  OMNIPAQUE IOHEXOL 350 MG/ML SOLN COMPARISON:  Chest radiograph dated 09/05/2022. FINDINGS: Evaluation of this exam is limited due to respiratory motion artifact. CTA CHEST FINDINGS Cardiovascular: There is no cardiomegaly or pericardial effusion. There is 3 vessel coronary vascular calcification. Mild atherosclerotic calcification of the thoracic aorta. No aneurysmal dilatation or dissection. The origins of the great vessels of the aortic arch appear patent. Evaluation of the pulmonary arteries is limited due to severe respiratory motion. No central pulmonary artery embolus identified. Mediastinum/Nodes: No hilar or mediastinal adenopathy. The esophagus is grossly unremarkable. No mediastinal fluid collection. Lungs/Pleura: Bilateral interstitial streaky atelectasis. No focal consolidation, pleural effusion, or pneumothorax. The central airways are patent. Musculoskeletal: No acute osseous pathology. Review of the MIP images confirms the above findings. CT ABDOMEN and PELVIS FINDINGS No intra-abdominal free air.  Trace free fluid in the pelvis. Hepatobiliary: The liver is unremarkable. No biliary dilatation with the gallbladder is unremarkable. Pancreas: Unremarkable. No pancreatic ductal dilatation or surrounding  inflammatory changes. Spleen: The spleen is unremarkable. Adrenals/Urinary Tract: The adrenal glands are unremarkable. There is no hydronephrosis on either side. There is symmetric enhancement and excretion of contrast by both kidneys. The visualized ureters and urinary bladder appear unremarkable. Stomach/Bowel: There is severe sigmoid diverticulosis as well as additional scattered colonic diverticula. There is diffuse inflammatory changes of the distal colon extending from the mid transverse colon to the sigmoid colon consistent with colitis. Loose stool throughout the colon consistent with diarrheal state. There is no bowel obstruction. The appendix is normal. Vascular/Lymphatic: Moderate aortoiliac atherosclerotic disease. The IVC is unremarkable. No portal venous gas. There is no adenopathy. Reproductive: Mildly enlarged prostate gland measuring 4.5 cm in transaxial diameter. The seminal vesicles are symmetric. Other: There is edema as well as small pockets of air in the visualized upper left thigh, likely related to recent surgery. No fluid collection. Musculoskeletal: Bilateral L5 pars defects. There is degenerative changes of the lumbar spine. No acute osseous pathology. Review of the MIP images confirms the above findings. IMPRESSION: 1. No acute intrathoracic pathology. No CT evidence of central pulmonary artery embolus. 2. Colitis of the distal colon. No bowel obstruction. Normal appendix. 3. Severe sigmoid diverticulosis. 4.  Aortic Atherosclerosis (ICD10-I70.0). Electronically Signed   By: Elgie Collard M.D.   On: 07/14/2023 20:11   (Echo, Carotid, EGD, Colonoscopy, ERCP)    Subjective: Patient seen in the morning rounds.  Feels much better.  Tolerating regular diet.  Wants to go home.  He had 2 small volume loose stools all day today.  Wife at the bedside.   Discharge Exam: Vitals:   07/18/23 2300 07/19/23 0147  BP: (!) 167/65 (!) 160/82  Pulse: 81 78  Resp: 18 19  Temp: 98.1 F (36.7  C) 97.7 F (36.5 C)  SpO2: 97% 97%   Vitals:   07/18/23 2109 07/18/23 2200 07/18/23 2300 07/19/23 0147  BP: 110/72 137/69 (!) 167/65 (!) 160/82  Pulse: 90 92 81 78  Resp: (!) 23 (!) 25 18 19   Temp:   98.1 F (36.7 C) 97.7 F (36.5 C)  TempSrc:   Oral Oral  SpO2: 99% 98% 97% 97%  Weight:      Height:        General: Pt is alert, awake, not in acute distress, hard of hearing. Cardiovascular: RRR, S1/S2 +, no rubs, no gallops Respiratory: CTA bilaterally, no wheezing, no rhonchi Abdominal: Soft, mild tenderness on deep palpation, ND, bowel sounds + Extremities: no edema, no cyanosis Left knee anterior incision with dressing on.  Looks clean and dry.  Has full range of motion.    The results of significant diagnostics from this hospitalization (including imaging, microbiology, ancillary and laboratory) are listed below for reference.     Microbiology: Recent Results (from the past 240 hour(s))  SARS Coronavirus 2 by RT PCR (hospital order, performed in [facility] hospital lab) *cepheid single result test* Anterior Nasal Swab     Status: None   Collection Time: 07/14/23  4:37 PM   Specimen: Anterior Nasal Swab  Result Value Ref Range Status   SARS Coronavirus 2 by RT PCR NEGATIVE NEGATIVE Final    Comment: (NOTE) SARS-CoV-2 target nucleic acids are NOT DETECTED.  The SARS-CoV-2 RNA is generally detectable in upper and lower respiratory specimens during the acute phase of infection. The lowest concentration of SARS-CoV-2 viral copies this assay can detect is 250 copies / mL. A negative result does not preclude SARS-CoV-2 infection and should not be used as the sole basis for treatment or other patient management decisions.  A negative result may occur with improper specimen collection / handling, submission of specimen other than nasopharyngeal swab, presence of viral mutation(s) within the areas targeted by this assay, and inadequate number of viral copies (<250 copies /  mL). A negative result must be combined with clinical observations, patient history, and epidemiological information.  Fact Sheet for Patients:   RoadLapTop.co.za  Fact Sheet for Healthcare Providers: http://kim-miller.com/  This test is not yet approved or  cleared by the [place] FDA and has been authorized for detection and/or diagnosis of SARS-CoV-2 by FDA under an Emergency Use Authorization (EUA).  This EUA will remain in effect (meaning this test can be used) for the duration of the COVID-19 declaration under Section 564(b)(1) of the Act, 21 U.S.C. section 360bbb-3(b)(1), unless the authorization is terminated or revoked sooner.  Performed at [facility], [address], [place], [place] [address]   Urine Culture     Status: None   Collection Time: 07/14/23  9:18 PM   Specimen: Urine, Clean Catch  Result Value Ref Range Status   Specimen Description   Final    URINE, CLEAN CATCH Performed at [facility], [address], [place], [place] [address]    Special Requests   Final    NONE Performed at [facility], [address] [place], [place] [address]  Culture   Final    NO GROWTH Performed at [facility], [address], [place], [place] [address]    Report Status 07/16/2023 FINAL  Final  Blood culture (routine x 2)     Status: None   Collection Time: 07/14/23  9:47 PM   Specimen: BLOOD  Result Value Ref Range Status   Specimen Description   Final    BLOOD BLOOD RIGHT FOREARM Performed at [facility], [address], [place], [place] [address]    Special Requests   Final    BOTTLES DRAWN AEROBIC AND ANAEROBIC Blood Culture adequate volume Performed at [facility], [address], [place], [place] [address]    Culture   Final    NO GROWTH 5 DAYS Performed at [facility], [address],  [place], [place] [address]    Report Status 07/19/2023 FINAL  Final  Blood culture (routine x 2)     Status: None   Collection Time: 07/14/23  9:51 PM   Specimen: BLOOD  Result Value Ref Range Status   Specimen Description   Final    BLOOD BLOOD RIGHT FOREARM Performed at [facility], [address] [place], [place] [address]    Special Requests   Final    BOTTLES DRAWN AEROBIC AND ANAEROBIC Blood Culture adequate volume Performed at [facility], [address], [place], [place] [address]    Culture   Final    NO GROWTH 5 DAYS Performed at [facility], [address] [place], [place] [address]    Report Status 07/19/2023 FINAL  Final     Labs: BNP (last 3 results) Recent Labs    09/05/22 1530  BNP 280.7*   Basic Metabolic Panel: Recent Labs  Lab 07/16/23 0228 07/16/23 1529 07/17/23 0313 07/18/23 0309 07/19/23 0431  NA 133* 132* 133* 133* 134*  K 2.7* 3.3* 3.2* 3.1* 3.6  CL 99 102 104 103 104  CO2 25 22 20* 21* 23  GLUCOSE 117* 91 87 101* 106*  BUN 21 21 19 14 8   CREATININE 0.87 0.86 0.77 0.75 0.81  CALCIUM 7.3* 7.4* 7.2* 7.5* 7.5*  MG 2.7* 2.4  --  2.0 1.9  PHOS 2.7  --   --   --  2.3*   Liver Function Tests: Recent Labs  Lab 07/14/23 1840 07/15/23 0442  AST 25 32  ALT 18 17  ALKPHOS 49 49  BILITOT 1.7* 1.4*  PROT 6.0* 6.0*  ALBUMIN 3.5 3.1*   No results for input(s): "LIPASE", "AMYLASE" in the last 168 hours. No results for input(s): "AMMONIA" in the last 168 hours. CBC: Recent Labs  Lab 07/14/23 1637 07/15/23 0442 07/16/23 0228 07/17/23 0313 07/18/23 0309 07/19/23 0431  WBC 18.9* 16.8* 14.8* 11.2* 8.9 9.0  NEUTROABS 17.5*  --  12.6*  --   --   --   HGB 12.5* 10.2* 9.1* 8.5* 9.3* 8.8*  HCT 39.8 31.0* 27.3* 26.1* 28.1* 26.1*  MCV 93.0 88.3 88.6 88.2 87.0 85.6  PLT 228 250 235 233 272 286   Cardiac Enzymes: No results for input(s): "CKTOTAL", "CKMB", "CKMBINDEX", "TROPONINI" in the last 168  hours. BNP: Invalid input(s): "POCBNP" CBG: No results for input(s): "GLUCAP" in the last 168 hours. D-Dimer No results for input(s): "DDIMER" in the last 72 hours. Hgb A1c No results for input(s): "HGBA1C" in the last 72 hours. Lipid Profile No results for input(s): "CHOL", "HDL", "LDLCALC", "TRIG", "CHOLHDL", "  LDLDIRECT" in the last 72 hours. Thyroid function studies No results for input(s): "TSH", "T4TOTAL", "T3FREE", "THYROIDAB" in the last 72 hours.  Invalid input(s): "FREET3" Anemia work up No results for input(s): "VITAMINB12", "FOLATE", "FERRITIN", "TIBC", "IRON", "RETICCTPCT" in the last 72 hours. Urinalysis    Component Value Date/Time   COLORURINE AMBER (A) 07/14/2023 2118   APPEARANCEUR CLEAR 07/14/2023 2118   LABSPEC >1.046 (H) 07/14/2023 2118   PHURINE 5.0 07/14/2023 2118   GLUCOSEU NEGATIVE 07/14/2023 2118   GLUCOSEU NEGATIVE 05/22/2012 0824   HGBUR NEGATIVE 07/14/2023 2118   HGBUR negative 07/09/2009 0852   BILIRUBINUR NEGATIVE 07/14/2023 2118   BILIRUBINUR n 09/21/2016 0925   KETONESUR NEGATIVE 07/14/2023 2118   PROTEINUR NEGATIVE 07/14/2023 2118   UROBILINOGEN 0.2 09/21/2016 0925   UROBILINOGEN 0.2 05/22/2012 0824   NITRITE NEGATIVE 07/14/2023 2118   LEUKOCYTESUR NEGATIVE 07/14/2023 2118   Sepsis Labs Recent Labs  Lab 07/16/23 0228 07/17/23 0313 07/18/23 0309 07/19/23 0431  WBC 14.8* 11.2* 8.9 9.0   Microbiology Recent Results (from the past 240 hour(s))  SARS Coronavirus 2 by RT PCR (hospital order, performed in [facility] hospital lab) *cepheid single result test* Anterior Nasal Swab     Status: None   Collection Time: 07/14/23  4:37 PM   Specimen: Anterior Nasal Swab  Result Value Ref Range Status   SARS Coronavirus 2 by RT PCR NEGATIVE NEGATIVE Final    Comment: (NOTE) SARS-CoV-2 target nucleic acids are NOT DETECTED.  The SARS-CoV-2 RNA is generally detectable in upper and lower respiratory specimens during the acute phase of infection.  The lowest concentration of SARS-CoV-2 viral copies this assay can detect is 250 copies / mL. A negative result does not preclude SARS-CoV-2 infection and should not be used as the sole basis for treatment or other patient management decisions.  A negative result may occur with improper specimen collection / handling, submission of specimen other than nasopharyngeal swab, presence of viral mutation(s) within the areas targeted by this assay, and inadequate number of viral copies (<250 copies / mL). A negative result must be combined with clinical observations, patient history, and epidemiological information.  Fact Sheet for Patients:   RoadLapTop.co.za  Fact Sheet for Healthcare Providers: http://kim-miller.com/  This test is not yet approved or  cleared by the [place] FDA and has been authorized for detection and/or diagnosis of SARS-CoV-2 by FDA under an Emergency Use Authorization (EUA).  This EUA will remain in effect (meaning this test can be used) for the duration of the COVID-19 declaration under Section 564(b)(1) of the Act, 21 U.S.C. section 360bbb-3(b)(1), unless the authorization is terminated or revoked sooner.  Performed at [facility], [address], [place], [place] [address]   Urine Culture     Status: None   Collection Time: 07/14/23  9:18 PM   Specimen: Urine, Clean Catch  Result Value Ref Range Status   Specimen Description   Final    URINE, CLEAN CATCH Performed at [facility], [address] [place], [place] [address]    Special Requests   Final    NONE Performed at [facility], [address], [place], [place] [address]    Culture   Final    NO GROWTH Performed at [facility], [address], [place], [place] [address]    Report Status 07/16/2023 FINAL  Final  Blood culture (routine x 2)     Status: None   Collection Time: 07/14/23  9:47 PM   Specimen: BLOOD  Result Value Ref Range Status   Specimen Description   Final    BLOOD BLOOD RIGHT FOREARM Performed at [facility], [address], [place], [place] [address]    Special Requests   Final    BOTTLES DRAWN AEROBIC AND ANAEROBIC Blood Culture adequate volume Performed at [facility], [address] [place], [place] [address]    Culture   Final    NO GROWTH 5 DAYS Performed at [facility], [address] [place], [place] [address]    Report Status 07/19/2023 FINAL  Final  Blood culture (routine x 2)     Status: None   Collection Time: 07/14/23  9:51 PM   Specimen: BLOOD  Result Value Ref Range Status   Specimen Description   Final    BLOOD BLOOD RIGHT FOREARM Performed at [facility], [address], [place], [place] [address]    Special Requests   Final    BOTTLES DRAWN AEROBIC AND ANAEROBIC Blood Culture adequate volume Performed at [facility], [address] [place], [place] [address]    Culture   Final    NO GROWTH 5 DAYS Performed at [facility], [address] [place], [place] [address]    Report Status 07/19/2023 FINAL  Final     Time coordinating discharge: 35 minutes  SIGNED:   Dorcas Carrow, MD  [facility] 07/19/2023, 3:41 PM

## 2023-07-19 NOTE — Care Plan (Signed)
Patient was willing to go home after eating lunch, discharge was prepared.  However by evening time he continued to have multiple episodes of diarrhea.  Debilitated gentleman going home without adequate support system, now with at least 7 episode of diarrhea since morning.  Will keep patient in the hospital tonight.  Anticipate home tomorrow morning if his diarrhea slows down.  Will discontinue antibiotics and IV fluids and monitor on regular oral intake.

## 2023-07-19 NOTE — Progress Notes (Signed)
Transition of Care [facility]) - Inpatient Brief Assessment   Patient Details  Name: David Goodman MRN: [id number] Date of Birth: November 21, 1941  Transition of Care [facility]) CM/SW Contact:    Larrie Kass, LCSW Phone Number: 07/19/2023, 9:40 AM   Clinical Narrative: Transition of Care Department [facility]) has reviewed patient and no TOC needs have been identified at this time. We will continue to monitor patient advancement through interdisciplinary progression rounds. If new patient transition needs arise, please place a TOC consult.     Transition of Care Asessment: Insurance and Status: Insurance coverage has been reviewed Patient has primary care physician: Yes Home environment has been reviewed: yes Prior level of function:: mod indpendednt Prior/Current Home Services: No current home services Social Determinants of Health Reivew: SDOH reviewed no interventions necessary Readmission risk has been reviewed: Yes Transition of care needs: no transition of care needs at this time

## 2023-07-19 NOTE — Care Management Important Message (Signed)
Important Message  Patient Details IM Letter given. Name: David Goodman MRN: [id number] Date of Birth: 12-15-1940   Medicare Important Message Given:  Yes     Caren Macadam 07/19/2023, 11:08 AM

## 2023-07-19 NOTE — Progress Notes (Signed)
Pharmacy Antibiotic Note  David Goodman is a 82 y.o. male admitted on 07/14/2023 with sepsis.   CT angio chest, chest abdomen pelvis on admission severe sigmoid diverticulosis, colitis of the distal colon no bowel obstruction no intrathoracic pathology and patient met SIRS criteria with tachypnea leukocytosis lactic acidosis. 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  Pharmacy has been consulted for Zosyn dosing.  Plan: - Continue Zosyn 3.375g IV q8h (4 hour infusion). - Monitor renal function and culture data   Height: 5\' 4"  (162.6 cm) Weight: 83.2 kg (183 lb 6.8 oz) IBW/kg (Calculated) : 59.2  Temp (24hrs), Avg:98.1 F (36.7 C), Min:97.7 F (36.5 C), Max:98.2 F (36.8 C)  Recent Labs  Lab 07/14/23 2203 07/14/23 2355 07/15/23 0158 07/15/23 0442 07/16/23 0228 07/16/23 1529 07/17/23 0313 07/18/23 0309 07/19/23 0431  WBC  --   --   --  16.8* 14.8*  --  11.2* 8.9 9.0  CREATININE  --   --   --  1.02 0.87 0.86 0.77 0.75 0.81  LATICACIDVEN 2.4* 2.5* 2.6* 2.1*  --   --   --   --   --     Estimated Creatinine Clearance: 69.6 mL/min (by C-G formula based on SCr of 0.81 mg/dL).    No Known Allergies  Antimicrobials this admission: 8/15 Vancomycin >> 8/19 8/15 Zosyn >>   Microbiology results: 8/15 BCx: NGF 8/15 UCx:  NGF  Thank you for allowing pharmacy to be a part of this patient's care.  Tacy Learn, PharmD Clinical Pharmacist 07/19/2023 12:11 PM

## 2023-07-19 NOTE — Progress Notes (Signed)
   07/19/23 2300  BiPAP/CPAP/SIPAP  BiPAP/CPAP/SIPAP Pt Type Adult  Reason BIPAP/CPAP not in use Non-compliant

## 2023-07-19 NOTE — Progress Notes (Signed)
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    PT Comments  Pt progressing, incr gait distance this am and minimal dyspnea; encouraged incr knee flexion with STS transfers, pt tol well       If plan is discharge home, recommend the following: A little help with walking and/or transfers;A little help with bathing/dressing/bathroom;Assistance with cooking/housework;Assist for transportation;Help with stairs or ramp for entrance   Can travel by private vehicle        Equipment Recommendations  None recommended by PT    Recommendations for Other Services       Precautions / Restrictions Precautions Precautions: Fall;Knee Restrictions LLE Weight Bearing: Weight bearing as tolerated     Mobility  Bed Mobility               General bed mobility comments: pt in recliner    Transfers Overall transfer level: Needs assistance Equipment used: Rolling walker (2 wheels) Transfers: Sit to/from Stand Sit to Stand: Contact guard assist, Supervision           General transfer comment: cues for L knee flexion and use of UEs to self assist.    Ambulation/Gait Ambulation/Gait assistance: Contact guard assist, Supervision Gait Distance (Feet): 170 Feet Assistive device: Rolling walker (2 wheels) Gait Pattern/deviations: Step-through pattern       General Gait Details: cues for gait progression, much improved stride length and incr stance time on LLE. inconsistent gait velocity however no LOB.   Stairs             Wheelchair Mobility     Tilt Bed    Modified Rankin (Stroke Patients Only)       Balance                                            Cognition Arousal: Alert Behavior During Therapy: WFL for tasks assessed/performed Overall Cognitive  Status: Within Functional Limits for tasks assessed                                          Exercises Total Joint Exercises Ankle Circles/Pumps: AROM, Both, 10 reps Long Arc Quad: AROM, Left, 10 reps, Seated    General Comments        Pertinent Vitals/Pain Pain Assessment Pain Assessment: Faces Faces Pain Scale: Hurts a little bit Pain Location: left knee Pain Descriptors / Indicators: Aching, Sore Pain Intervention(s): Limited activity within patient's tolerance, Monitored during session, Premedicated before session, Repositioned    Home Living                          Prior Function            PT Goals (current goals can now be found in the care plan section) Acute Rehab PT Goals Patient Stated Goal: Regain IND PT Goal Formulation: With patient Time For Goal Achievement: 07/18/23 Potential to Achieve Goals: Good Progress towards PT goals: Progressing toward goals    Frequency    7X/week      PT Plan      Co-evaluation  AM-PAC PT "6 Clicks" Mobility   Outcome Measure  Help needed turning from your back to your side while in a flat bed without using bedrails?: A Little Help needed moving from lying on your back to sitting on the side of a flat bed without using bedrails?: A Little Help needed moving to and from a bed to a chair (including a wheelchair)?: A Little Help needed standing up from a chair using your arms (e.g., wheelchair or bedside chair)?: A Little Help needed to walk in hospital room?: A Little Help needed climbing 3-5 steps with a railing? : A Little 6 Click Score: 18    End of Session Equipment Utilized During Treatment: Gait belt Activity Tolerance: Patient tolerated treatment well     PT Visit Diagnosis: Difficulty in walking, not elsewhere classified (R26.2)     Time: 8119-1478 PT Time Calculation (min) (ACUTE ONLY): 16 min  Charges:    $Gait Training: 8-22 mins PT General  Charges $$ ACUTE PT VISIT: 1 Visit                     Shaili Donalson, PT  Acute Rehab Dept ([facility]) [phone]  07/19/2023    [facility] 07/19/2023, 1:45 PM

## 2023-07-19 NOTE — Progress Notes (Signed)
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    PT Comments  Pt is making excellent progress toward PT goals. Knee ROM 10 to 103 flexion, gait pattern continues to improve with incr  stride length and and incr stance time LLE. Pain controlled with activity. Ice to knee after session. Encouraged pt to continue HEP until OPPT appt.   If plan is discharge home, recommend the following: A little help with walking and/or transfers;A little help with bathing/dressing/bathroom;Assistance with cooking/housework;Assist for transportation;Help with stairs or ramp for entrance   Can travel by private vehicle        Equipment Recommendations  None recommended by PT    Recommendations for Other Services       Precautions / Restrictions Precautions Precautions: Fall;Knee Restrictions LLE Weight Bearing: Weight bearing as tolerated     Mobility  Bed Mobility               General bed mobility comments: pt in recliner    Transfers Overall transfer level: Needs assistance Equipment used: Rolling walker (2 wheels) Transfers: Sit to/from Stand Sit to Stand: Supervision           General transfer comment: cues for incr  L knee flexion and use of UEs to self assist. STS from toilet and chair    Ambulation/Gait Ambulation/Gait assistance: Supervision Gait Distance (Feet): 160 Feet (10' more) Assistive device: Rolling walker (2 wheels) Gait Pattern/deviations: Step-through pattern       General Gait Details: cues for continued step through and L knee flexion during swing phase; much improved stride length and incr stance time on LLE.   Stairs             Wheelchair Mobility     Tilt Bed    Modified Rankin (Stroke Patients Only)       Balance                                             Cognition Arousal: Alert Behavior During Therapy: WFL for tasks assessed/performed Overall Cognitive Status: Within Functional Limits for tasks assessed                                          Exercises Total Joint Exercises Ankle Circles/Pumps: AROM, Both, 10 reps Quad Sets: AROM, Both, 10 reps Short Arc Quad: AROM, Left, 10 reps Heel Slides: AAROM, Left, Supine, 10 reps Hip ABduction/ADduction: AROM, AAROM, Left, Supine, 10 reps Long Arc Quad: AROM, Left, 10 reps, Seated Knee Flexion: AAROM, Left, 5 reps, Seated Goniometric ROM: 10 to 103 degrees flexion L knee    General Comments        Pertinent Vitals/Pain Pain Assessment Pain Assessment: 0-10 Pain Score: 2  Faces Pain Scale: Hurts a little bit Pain Location: left knee Pain Descriptors / Indicators: Aching, Sore Pain Intervention(s): Limited activity within patient's tolerance, Monitored during session, Premedicated before session, Repositioned, Ice applied    Home Living                          Prior Function  PT Goals (current goals can now be found in the care plan section) Acute Rehab PT Goals Patient Stated Goal: Regain IND PT Goal Formulation: With patient Time For Goal Achievement: 07/18/23 Potential to Achieve Goals: Good Progress towards PT goals: Progressing toward goals    Frequency    7X/week      PT Plan      Co-evaluation              AM-PAC PT "6 Clicks" Mobility   Outcome Measure  Help needed turning from your back to your side while in a flat bed without using bedrails?: A Little Help needed moving from lying on your back to sitting on the side of a flat bed without using bedrails?: A Little Help needed moving to and from a bed to a chair (including a wheelchair)?: A Little Help needed standing up from a chair using your arms (e.g., wheelchair or bedside chair)?: A Little Help needed to walk in  hospital room?: A Little Help needed climbing 3-5 steps with a railing? : A Little 6 Click Score: 18    End of Session Equipment Utilized During Treatment: Gait belt Activity Tolerance: Patient tolerated treatment well Patient left: in chair;with call bell/phone within reach;with chair alarm set;with family/visitor present   PT Visit Diagnosis: Difficulty in walking, not elsewhere classified (R26.2)     Time: 8295-6213 PT Time Calculation (min) (ACUTE ONLY): 25 min  Charges:    $Gait Training: 8-22 mins $Therapeutic Exercise: 8-22 mins PT General Charges $$ ACUTE PT VISIT: 1 Visit                     Delice Bison, PT  Acute Rehab Dept [facility]) [phone]  07/19/2023    [facility] 07/19/2023, 5:31 PM

## 2023-07-19 NOTE — Plan of Care (Signed)
  Problem: Activity: Goal: Range of joint motion will improve Outcome: Progressing   Problem: Pain Management: Goal: Pain level will decrease with appropriate interventions Outcome: Progressing   Problem: Clinical Measurements: Goal: Diagnostic test results will improve Outcome: Progressing Goal: Signs and symptoms of infection will decrease Outcome: Progressing   Problem: Respiratory: Goal: Ability to maintain adequate ventilation will improve Outcome: Progressing

## 2023-07-20 DIAGNOSIS — R6510 Systemic inflammatory response syndrome (SIRS) of non-infectious origin without acute organ dysfunction: Secondary | ICD-10-CM | POA: Diagnosis not present

## 2023-07-20 LAB — CBC
HCT: 28.9 % — ABNORMAL LOW (ref 39.0–52.0)
Hemoglobin: 9.7 g/dL — ABNORMAL LOW (ref 13.0–17.0)
MCH: 29 pg (ref 26.0–34.0)
MCHC: 33.6 g/dL (ref 30.0–36.0)
MCV: 86.5 fL (ref 80.0–100.0)
Platelets: 329 10*3/uL (ref 150–400)
RBC: 3.34 MIL/uL — ABNORMAL LOW (ref 4.22–5.81)
RDW: 14.6 % (ref 11.5–15.5)
WBC: 11.6 10*3/uL — ABNORMAL HIGH (ref 4.0–10.5)
nRBC: 0 % (ref 0.0–0.2)

## 2023-07-20 LAB — BASIC METABOLIC PANEL
Anion gap: 11 (ref 5–15)
BUN: 8 mg/dL (ref 8–23)
CO2: 23 mmol/L (ref 22–32)
Calcium: 8.4 mg/dL — ABNORMAL LOW (ref 8.9–10.3)
Chloride: 100 mmol/L (ref 98–111)
Creatinine, Ser: 0.79 mg/dL (ref 0.61–1.24)
GFR, Estimated: >60 mL/min (ref >60)
Glucose, Bld: 106 mg/dL — ABNORMAL HIGH (ref 70–99)
Potassium: 4.1 mmol/L (ref 3.5–5.1)
Sodium: 134 mmol/L — ABNORMAL LOW (ref 135–145)

## 2023-07-20 MED ORDER — GUAIFENESIN-DM 100-10 MG/5ML PO SYRP
5.0000 mL | ORAL_SOLUTION | ORAL | Status: DC | PRN
  Administered 2023-07-20: 5 mL via ORAL
  Filled 2023-07-20: qty 10

## 2023-07-20 NOTE — Plan of Care (Signed)

## 2023-07-20 NOTE — Progress Notes (Signed)
PROGRESS NOTE    Cinsere Damboise  AOZ:[id number] DOB: [birth year] DOA: 07/14/2023 PCP: Loyola Mast, MD    Brief Narrative:  Admitted with postop ileus.   Assessment & Plan:   Postop ileus Multiple medical issues  Adequately improved today.  See discharge summary.     Code Status: Full code Family Communication: Wife at the bedside Disposition Plan: Home today.       Consultants:  None  Procedures:  None  Antimicrobials:  IV Zosyn, completed therapy   Subjective: Patient seen and examined in the morning rounds.  Wife arrived to the bedside.  Patient had 2 semiformed stool overnight.  Feels comfortable to go home today.  Denies any abdominal pain.  He will use minimal pain medication for his left knee but not for abdomen.  Objective: Vitals:   07/19/23 0147 07/19/23 1423 07/19/23 1953 07/20/23 0502  BP: (!) 160/82 (!) 183/88 (!) 152/74 (!) 145/75  Pulse: 78 79 94 79  Resp: 19  18 18   Temp: 97.7 F (36.5 C)  99.1 F (37.3 C) 98.4 F (36.9 C)  TempSrc: Oral  Oral Oral  SpO2: 97% 93% 98% 94%  Weight:      Height:        Intake/Output Summary (Last 24 hours) at 07/20/2023 1430 Last data filed at 07/20/2023 0154 Gross per 24 hour  Intake 1155.04 ml  Output 0 ml  Net 1155.04 ml   Filed Weights   07/14/23 1616 07/15/23 0905  Weight: 78 kg 83.2 kg    Examination:  General exam: Appears calm and comfortable  Respiratory system: Clear to auscultation. Respiratory effort normal. Cardiovascular system: No added sounds.   Gastrointestinal system: Soft.  Nontender.  Bowel sound present. Central nervous system: Alert and oriented. No focal neurological deficits.   Data Reviewed: I have personally reviewed following labs and imaging studies  CBC: Recent Labs  Lab 07/14/23 1637 07/15/23 0442 07/16/23 0228 07/17/23 0313 07/18/23 0309 07/19/23 0431 07/20/23 0430  WBC 18.9*   < > 14.8* 11.2* 8.9 9.0 11.6*  NEUTROABS 17.5*  --  12.6*  --   --   --   --    HGB 12.5*   < > 9.1* 8.5* 9.3* 8.8* 9.7*  HCT 39.8   < > 27.3* 26.1* 28.1* 26.1* 28.9*  MCV 93.0   < > 88.6 88.2 87.0 85.6 86.5  PLT 228   < > 235 233 272 286 329   < > = values in this interval not displayed.   Basic Metabolic Panel: Recent Labs  Lab 07/16/23 0228 07/16/23 1529 07/17/23 0313 07/18/23 0309 07/19/23 0431 07/20/23 0430  NA 133* 132* 133* 133* 134* 134*  K 2.7* 3.3* 3.2* 3.1* 3.6 4.1  CL 99 102 104 103 104 100  CO2 25 22 20* 21* 23 23  GLUCOSE 117* 91 87 101* 106* 106*  BUN 21 21 19 14 8 8   CREATININE 0.87 0.86 0.77 0.75 0.81 0.79  CALCIUM 7.3* 7.4* 7.2* 7.5* 7.5* 8.4*  MG 2.7* 2.4  --  2.0 1.9  --   PHOS 2.7  --   --   --  2.3*  --    GFR: Estimated Creatinine Clearance: 70.5 mL/min (by C-G formula based on SCr of 0.79 mg/dL). Liver Function Tests: Recent Labs  Lab 07/14/23 1840 07/15/23 0442  AST 25 32  ALT 18 17  ALKPHOS 49 49  BILITOT 1.7* 1.4*  PROT 6.0* 6.0*  ALBUMIN 3.5 3.1*   No results for  input(s): "LIPASE", "AMYLASE" in the last 168 hours. No results for input(s): "AMMONIA" in the last 168 hours. Coagulation Profile: Recent Labs  Lab 07/15/23 0442  INR 1.3*   Cardiac Enzymes: No results for input(s): "CKTOTAL", "CKMB", "CKMBINDEX", "TROPONINI" in the last 168 hours. BNP (last 3 results) No results for input(s): "PROBNP" in the last 8760 hours. HbA1C: No results for input(s): "HGBA1C" in the last 72 hours. CBG: No results for input(s): "GLUCAP" in the last 168 hours. Lipid Profile: No results for input(s): "CHOL", "HDL", "LDLCALC", "TRIG", "CHOLHDL", "LDLDIRECT" in the last 72 hours. Thyroid Function Tests: No results for input(s): "TSH", "T4TOTAL", "FREET4", "T3FREE", "THYROIDAB" in the last 72 hours. Anemia Panel: No results for input(s): "VITAMINB12", "FOLATE", "FERRITIN", "TIBC", "IRON", "RETICCTPCT" in the last 72 hours. Sepsis Labs: Recent Labs  Lab 07/14/23 2203 07/14/23 2355 07/15/23 0158 07/15/23 0442   PROCALCITON  --   --   --  2.06  LATICACIDVEN 2.4* 2.5* 2.6* 2.1*    Recent Results (from the past 240 hour(s))  SARS Coronavirus 2 by RT PCR (hospital order, performed in [facility] hospital lab) *cepheid single result test* Anterior Nasal Swab     Status: None   Collection Time: 07/14/23  4:37 PM   Specimen: Anterior Nasal Swab  Result Value Ref Range Status   SARS Coronavirus 2 by RT PCR NEGATIVE NEGATIVE Final    Comment: (NOTE) SARS-CoV-2 target nucleic acids are NOT DETECTED.  The SARS-CoV-2 RNA is generally detectable in upper and lower respiratory specimens during the acute phase of infection. The lowest concentration of SARS-CoV-2 viral copies this assay can detect is 250 copies / mL. A negative result does not preclude SARS-CoV-2 infection and should not be used as the sole basis for treatment or other patient management decisions.  A negative result may occur with improper specimen collection / handling, submission of specimen other than nasopharyngeal swab, presence of viral mutation(s) within the areas targeted by this assay, and inadequate number of viral copies (<250 copies / mL). A negative result must be combined with clinical observations, patient history, and epidemiological information.  Fact Sheet for Patients:   RoadLapTop.co.za  Fact Sheet for Healthcare Providers: http://kim-miller.com/  This test is not yet approved or  cleared by the [place] FDA and has been authorized for detection and/or diagnosis of SARS-CoV-2 by FDA under an Emergency Use Authorization (EUA).  This EUA will remain in effect (meaning this test can be used) for the duration of the COVID-19 declaration under Section 564(b)(1) of the Act, 21 U.S.C. section 360bbb-3(b)(1), unless the authorization is terminated or revoked sooner.  Performed at [facility], [address], [place], [place] [address]   Urine  Culture     Status: None   Collection Time: 07/14/23  9:18 PM   Specimen: Urine, Clean Catch  Result Value Ref Range Status   Specimen Description   Final    URINE, CLEAN CATCH Performed at [facility], [address], [place], [place] [address]    Special Requests   Final    NONE Performed at [facility], [address] [place] [address], [place], [place] [address]    Culture   Final    NO GROWTH Performed at [facility], [address], [place], [place] [address]    Report Status 07/16/2023 FINAL  Final  Blood culture (routine x 2)     Status: None   Collection Time: 07/14/23  9:47 PM   Specimen: BLOOD  Result Value Ref Range Status   Specimen Description   Final    BLOOD BLOOD RIGHT FOREARM Performed at [facility], [address], [place], [place] [address]    Special Requests   Final    BOTTLES DRAWN AEROBIC AND ANAEROBIC Blood Culture adequate volume Performed at [facility], [address], [place], [place] [address]    Culture   Final    NO GROWTH 5 DAYS Performed at [facility], [address] [place], [place] [address]    Report Status 07/19/2023 FINAL  Final  Blood culture (routine x 2)     Status: None   Collection Time: 07/14/23  9:51 PM   Specimen: BLOOD  Result Value Ref Range Status   Specimen Description   Final    BLOOD BLOOD RIGHT FOREARM Performed at [facility], [address], [place], [place] [address]    Special Requests   Final    BOTTLES DRAWN AEROBIC AND ANAEROBIC Blood Culture adequate volume Performed at [facility], [address], [place], [place] [address]    Culture   Final    NO GROWTH 5 DAYS Performed at [facility], [address], [place], [place] [address]    Report Status 07/19/2023 FINAL  Final         Radiology Studies: No results found.      Scheduled Meds:  Chlorhexidine Gluconate Cloth   6 each Topical Daily   enoxaparin (LOVENOX) injection  40 mg Subcutaneous Q24H   irbesartan  300 mg Oral Daily   latanoprost  1 drop Both Eyes QHS   pantoprazole  40 mg Oral Daily   tamsulosin  0.4 mg Oral Daily   Continuous Infusions:   LOS: 5 days    Time spent: 25 minutes    Dorcas Carrow, MD [facility]

## 2023-07-21 ENCOUNTER — Telehealth: Payer: Self-pay

## 2023-07-21 NOTE — Transitions of Care (Post Inpatient/ED Visit) (Signed)
07/21/2023  Name: David Goodman MRN: [id number] DOB: February 12, 1941  Today's TOC FU Call Status: Today's TOC FU Call Status:: Successful TOC FU Call Completed TOC FU Call Complete Date: 07/21/23  Transition Care Management Follow-up Telephone Call Date of Discharge: 07/20/23 Discharge Facility: [facility]) Type of Discharge: Inpatient Admission Primary Inpatient Discharge Diagnosis:: Systemic Inflammatory Response System How have you been since you were released from the hospital?: Better Any questions or concerns?: No  Items Reviewed: Did you receive and understand the discharge instructions provided?: Yes Medications obtained,verified, and reconciled?: Yes (Medications Reviewed) Any new allergies since your discharge?: No Dietary orders reviewed?: No Do you have support at home?: Yes People in Home: spouse Name of Support/Comfort Primary Source: [place] [employer]  Medications Reviewed Today: Medications Reviewed Today     Reviewed by Jodelle Gross, RN (Case Manager) on 07/21/23 at 1034  Med List Status: <None>   Medication Order Taking? Sig Documenting Provider Last Dose Status Informant  Ascorbic Acid (VITAMIN C) 1000 MG tablet [id number] Yes Take 1,000 mg by mouth 2 (two) times a week. [provider] Taking Active Spouse/Significant Other           Med Note (David Goodman Apr 11, 2022  3:04 PM)    aspirin 81 MG chewable tablet [id number] No Chew 1 tablet (81 mg total) by mouth 2 (two) times daily for 20 days. Then resume one 81 mg aspirin once a day  Patient not taking: Reported on 07/16/2023   Derenda Fennel, PA Not Taking Active Spouse/Significant Other  clonazepam (KLONOPIN) 0.5 MG disintegrating tablet [id number] Yes Take 1 tablet (0.5 mg total) by mouth 2 (two) times daily as needed for up to 15 days (Anxiety). David Carrow, MD Taking Active   Cyanocobalamin (B-12 PO) [id number] Yes Take 1 tablet by mouth daily. [provider] Taking Active  Spouse/Significant Other  hydrochlorothiazide (HYDRODIURIL) 25 MG tablet [id number] Yes Take 1 tablet (25 mg total) by mouth daily. David Mast, MD Taking Active Spouse/Significant Other           Med Note Alvera Novel, Genoveva Ill   QQV Jul 16, 2023  3:08 PM) Patient and wife states he is taking the Valsartan as well   latanoprost (XALATAN) 0.005 % ophthalmic solution [id number] Yes Place 1 drop into both eyes at bedtime. David Savers, MD Taking Active Spouse/Significant Other  MAGNESIUM PO [id number] Yes Take 400 mg by mouth at bedtime. [provider] Taking Active Spouse/Significant Other  methocarbamol (ROBAXIN) 500 MG tablet [id number] Yes Take 1 tablet (500 mg total) by mouth every 6 (six) hours as needed for muscle spasms. Edmisten, Lyn Hollingshead, PA Taking Active Spouse/Significant Other  nitroGLYCERIN (NITROSTAT) 0.4 MG SL tablet [id number] Yes Place 1 tablet (0.4 mg total) under the tongue every 5 (five) minutes as needed for chest pain. Corky Crafts, MD Taking Active Spouse/Significant Other  ondansetron (ZOFRAN) 4 MG tablet [id number] Yes Take 1 tablet (4 mg total) by mouth every 6 (six) hours as needed for nausea.  Patient taking differently: Take 4 mg by mouth as needed for nausea or vomiting.   Edmisten, Lyn Hollingshead, PA Taking Active Spouse/Significant Other  oxyCODONE (OXY IR/ROXICODONE) 5 MG immediate release tablet [id number] Yes Take 1-2 tablets (5-10 mg total) by mouth every 8 (eight) hours as needed for severe pain. Edmisten, Lyn Hollingshead, PA Taking Active Spouse/Significant Other  pantoprazole (PROTONIX) 40 MG tablet [id number] Yes Take 1 tablet by mouth once daily Corky Crafts, MD  Taking Active Spouse/Significant Other  phosphorus (K PHOS NEUTRAL) 155-852-130 MG tablet [id number] Yes Take 2 tablets (500 mg total) by mouth 2 (two) times daily for 15 days. David Carrow, MD Taking Active   Polyvinyl Alcohol-Povidone [facility] OP) [id number] Yes Place 1 drop into the  left eye 2 (two) times daily as needed (dryness). [provider] Taking Active Spouse/Significant Other  psyllium (METAMUCIL) 58.6 % powder [id number] Yes Take 1 packet by mouth daily. [provider] Taking Active Spouse/Significant Other  rosuvastatin (CRESTOR) 20 MG tablet [id number] Yes Take 1 tablet by mouth once daily David Mast, MD Taking Active Spouse/Significant Other  tadalafil (CIALIS) 20 MG tablet [id number] No Take 1 tablet (20 mg total) by mouth daily as needed for erectile dysfunction. David Aspen, Limmie Patricia, MD Unknown Active Spouse/Significant Other  tamsulosin (FLOMAX) 0.4 MG CAPS capsule [id number] Yes TAKE 1 CAPSULE BY MOUTH DAILY David Mast, MD Taking Active Spouse/Significant Other  traMADol (ULTRAM) 50 MG tablet [id number] Yes Take 1-2 tablets (50-100 mg total) by mouth every 6 (six) hours as needed for moderate pain. Edmisten, Lyn Hollingshead, PA Taking Active Spouse/Significant Other  valsartan (DIOVAN) 320 MG tablet [id number] Yes Take 1 tablet by mouth once daily Corky Crafts, MD Taking Active Spouse/Significant Other  zinc gluconate 50 MG tablet [id number] Yes Take 50 mg by mouth daily. [provider] Taking Active Spouse/Significant Other            Home Care and Equipment/Supplies: Were Home Health Services Ordered?: No Any new equipment or medical supplies ordered?: No  Functional Questionnaire: Do you need assistance with bathing/showering or dressing?: No Do you need assistance with meal preparation?: No Do you need assistance with eating?: No Do you have difficulty maintaining continence: No Do you need assistance with getting out of bed/getting out of a chair/moving?: No Do you have difficulty managing or taking your medications?: No  Follow up appointments reviewed: PCP Follow-up appointment confirmed?: No MD Provider Line Number:[phone] Given: No (Patient will call and schedule appt. He did not want care  guide to schedule) [facility]ist Hospital Follow-up appointment confirmed?: NA Do you need transportation to your follow-up appointment?: No Do you understand care options if your condition(s) worsen?: Yes-patient verbalized understanding  SDOH Interventions Today    Flowsheet Row Most Recent Value  SDOH Interventions   Food Insecurity Interventions Intervention Not Indicated  Housing Interventions Intervention Not Indicated  Transportation Interventions Intervention Not Indicated     Jodelle Gross RN, BSN, CCM [facility] RN Care Coordinator/ Transitions of Care Direct Dial: [phone]  Fax: [phone]

## 2023-07-22 DIAGNOSIS — M25562 Pain in left knee: Secondary | ICD-10-CM | POA: Diagnosis not present

## 2023-07-25 DIAGNOSIS — M25562 Pain in left knee: Secondary | ICD-10-CM | POA: Diagnosis not present

## 2023-07-27 DIAGNOSIS — M25562 Pain in left knee: Secondary | ICD-10-CM | POA: Diagnosis not present

## 2023-07-31 ENCOUNTER — Telehealth: Payer: Medicare Other | Admitting: Family Medicine

## 2023-07-31 DIAGNOSIS — R110 Nausea: Secondary | ICD-10-CM

## 2023-07-31 DIAGNOSIS — R197 Diarrhea, unspecified: Secondary | ICD-10-CM | POA: Diagnosis not present

## 2023-07-31 MED ORDER — ONDANSETRON HCL 4 MG PO TABS: 4.0000 mg | ORAL_TABLET | Freq: Three times a day (TID) | ORAL | 0 refills | Status: DC | PRN

## 2023-07-31 NOTE — Progress Notes (Signed)
Virtual Visit Consent   David Goodman, you are scheduled for a virtual visit with a [facility] provider today. Just as with appointments in the office, your consent must be obtained to participate. Your consent will be active for this visit and any virtual visit you may have with one of our providers in the next 365 days. If you have a MyChart account, a copy of this consent can be sent to you electronically.  As this is a virtual visit, video technology does not allow for your provider to perform a traditional examination. This may limit your provider's ability to fully assess your condition. If your provider identifies any concerns that need to be evaluated in person or the need to arrange testing (such as labs, EKG, etc.), we will make arrangements to do so. Although advances in technology are sophisticated, we cannot ensure that it will always work on either your end or our end. If the connection with a video visit is poor, the visit may have to be switched to a telephone visit. With either a video or telephone visit, we are not always able to ensure that we have a secure connection.  By engaging in this virtual visit, you consent to the provision of healthcare and authorize for your insurance to be billed (if applicable) for the services provided during this visit. Depending on your insurance coverage, you may receive a charge related to this service.  I need to obtain your verbal consent now. Are you willing to proceed with your visit today? David Goodman has provided verbal consent on 07/31/2023 for a virtual visit (video or telephone). David Curio, FNP  Date: 07/31/2023 10:57 AM  Virtual Visit via Video Note   I, David Goodman, connected with  David Goodman  ([id number], ([birth year]) on 07/31/23 at 10:45 AM EDT by a video-enabled telemedicine application and verified that I am speaking with the correct person using two identifiers.  Location: Patient: Virtual Visit Location Patient: Home Provider:  Virtual Visit Location Provider: Home Office   I discussed the limitations of evaluation and management by telemedicine and the availability of in person appointments. The patient expressed understanding and agreed to proceed.    History of Present Illness: David Goodman is a 82 y.o. who identifies as a male who was assigned male at birth, and is being seen today for nausea and diarrhea. He says he had knee surgery then was constipation so he has been using a stool softener and taking metamucil. He now had nausea and diarrhea. Ondansetron was helping. 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 09/09/2022   Gait instability 09/09/2022   Acute right-sided low back pain without sciatica 09/03/2022   Nonallergic rhinitis 08/04/2022   Personal history of colonic polyps 07/22/2022   Gastro-esophageal reflux disease with esophagitis 07/22/2022   Gastric polyp 07/22/2022   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   GIB (gastrointestinal bleeding) 04/11/2022   OSA on CPAP 12/13/2021   Vivid dream 09/21/2021   Excessive daytime sleepiness 09/21/2021  Snoring 09/21/2021   Coronary artery disease involving native coronary artery of native heart 09/21/2021   SOBOE (shortness of breath on exertion) 09/21/2021   HFrEF (heart failure with reduced ejection fraction) (HCC)    Bell's palsy, left 06/05/2021   Cerebrovascular disease 06/05/2021   Hiatal hernia 05/29/2021   Open-angle  glaucoma 02/27/2021   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   Erectile dysfunction due to arterial insufficiency 02/27/2021   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   Hemorrhoids 07/17/2020   Syncope 11/15/2018   Diverticulitis 05/25/2018   Osteoarthritis of right knee 12/13/2017   OA (osteoarthritis) of knee 12/13/2017   Testosterone deficiency 03/22/2017   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/27/2016   BPH (benign prostatic hyperplasia) 06/18/2013   Viral URI with cough 09/03/2008   HLD (hyperlipidemia) 07/13/2007   Essential hypertension 07/13/2007   Allergic rhinitis 07/13/2007   Peptic ulcer disease 07/13/2007   History of nephrolithiasis 07/13/2007    Allergies: No Known Allergies Medications:  Current Outpatient Medications:    Ascorbic Acid (VITAMIN C) 1000 MG tablet, Take 1,000 mg by mouth 2 (two) times a week., Disp: , Rfl:    aspirin 81 MG chewable tablet, Chew 1 tablet (81 mg total) by mouth 2 (two) times daily for 20 days. Then resume one 81 mg aspirin once a day (Patient not taking: Reported on 07/16/2023), Disp: 40 tablet, Rfl: 0   clonazepam (KLONOPIN) 0.5 MG disintegrating tablet, Take 1 tablet (0.5 mg total) by mouth 2 (two) times daily as needed for up to 15 days (Anxiety)., Disp: 28 tablet, Rfl: 0   Cyanocobalamin (B-12 PO), Take 1 tablet by mouth daily., Disp: , Rfl:    hydrochlorothiazide (HYDRODIURIL) 25 MG tablet, Take 1 tablet (25 mg total) by mouth daily., Disp: 90 tablet, Rfl: 3   latanoprost (XALATAN) 0.005 % ophthalmic solution, Place 1 drop into both eyes at bedtime., Disp: 7.5 mL, Rfl: 1   MAGNESIUM PO, Take 400 mg by mouth at bedtime., Disp: , Rfl:    methocarbamol (ROBAXIN) 500 MG tablet, Take 1 tablet (500 mg total) by mouth every 6 (six) hours as needed for muscle spasms., Disp: 40 tablet, Rfl: 0   nitroGLYCERIN (NITROSTAT) 0.4 MG SL tablet, Place 1 tablet (0.4 mg total) under the tongue every 5 (five) minutes  as needed for chest pain., Disp: 25 tablet, Rfl: 3   ondansetron (ZOFRAN) 4 MG tablet, Take 1 tablet (4 mg total) by mouth every 6 (six) hours as needed for nausea. (Patient taking differently: Take 4 mg by mouth as needed for nausea or vomiting.), Disp: 20 tablet, Rfl: 0   oxyCODONE (OXY IR/ROXICODONE) 5 MG immediate release tablet, Take 1-2 tablets (5-10 mg total) by mouth every 8 (eight) hours as needed for severe pain., Disp: 42 tablet, Rfl: 0   pantoprazole (PROTONIX) 40 MG tablet, Take 1 tablet by mouth once daily, Disp: 90 tablet, Rfl: 0   phosphorus (K PHOS NEUTRAL) 155-852-130 MG tablet, Take 2 tablets (500 mg total) by mouth 2 (two) times daily for 15 days., Disp: 60 tablet, Rfl: 0   Polyvinyl Alcohol-Povidone (REFRESH OP), Place 1 drop into the left eye 2 (two) times daily as needed (dryness)., Disp: , Rfl:    psyllium (METAMUCIL) 58.6 % powder, Take 1 packet by mouth daily., Disp: , Rfl:    rosuvastatin (CRESTOR) 20 MG tablet, Take 1 tablet by mouth once daily, Disp: 90 tablet, Rfl: 3   tadalafil (CIALIS) 20 MG tablet, Take 1 tablet (20  mg total) by mouth daily as needed for erectile dysfunction., Disp: 30 tablet, Rfl: 2   tamsulosin (FLOMAX) 0.4 MG CAPS capsule, TAKE 1 CAPSULE BY MOUTH DAILY, Disp: 100 capsule, Rfl: 2   traMADol (ULTRAM) 50 MG tablet, Take 1-2 tablets (50-100 mg total) by mouth every 6 (six) hours as needed for moderate pain., Disp: 40 tablet, Rfl: 0   valsartan (DIOVAN) 320 MG tablet, Take 1 tablet by mouth once daily, Disp: 90 tablet, Rfl: 0   zinc gluconate 50 MG tablet, Take 50 mg by mouth daily., Disp: , Rfl:   Observations/Objective: Patient is well-developed, well-nourished in no acute distress.  Resting comfortably  at home.  Head is normocephalic, atraumatic.  No labored breathing.  Speech is clear and coherent with logical content.  Patient is alert and oriented at baseline.    Assessment and Plan: 1. Nausea  Increase fluids, no milk or dairy, UC if  sx worsen. Hold metamucil and stool softeners. Refilled ondansetron.   Follow Up Instructions: I discussed the assessment and treatment plan with the patient. The patient was provided an opportunity to ask questions and all were answered. The patient agreed with the plan and demonstrated an understanding of the instructions.  A copy of instructions were sent to the patient via MyChart unless otherwise noted below.     The patient was advised to call back or seek an in-person evaluation if the symptoms worsen or if the condition fails to improve as anticipated.  Time:  I spent 10 minutes with the patient via telehealth technology discussing the above problems/concerns.    David Curio, FNP

## 2023-07-31 NOTE — Patient Instructions (Signed)
Diarrhea, Adult Diarrhea is frequent loose and sometimes watery bowel movements. Diarrhea can make you feel weak and cause you to become dehydrated. Dehydration is a condition in which there is not enough water or other fluids in the body. Dehydration can make you tired and thirsty, cause you to have a dry mouth, and decrease how often you urinate. Diarrhea typically lasts 2-3 days. However, it can last longer if it is a sign of something more serious. It is important to treat your diarrhea as told by your health care provider. Follow these instructions at home: Eating and drinking     Follow these recommendations as told by your health care provider: Take an oral rehydration solution (ORS). This is an over-the-counter medicine that helps return your body to its normal balance of nutrients and water. It is found at pharmacies and retail stores. Drink enough fluid to keep your urine pale yellow. Drink fluids such as water, diluted fruit juice, and low-calorie sports drinks. You can drink milk also, if desired. Sucking on ice chips is another way to get fluids. Avoid drinking fluids that contain a lot of sugar or caffeine, such as soda, energy drinks, and regular sports drinks. Avoid alcohol. Eat bland, easy-to-digest foods in small amounts as you are able. These foods include bananas, applesauce, rice, lean meats, toast, and crackers. Avoid spicy or fatty foods.  Medicines Take over-the-counter and prescription medicines only as told by your health care provider. If you were prescribed antibiotics, take them as told by your health care provider. Do not stop using the antibiotic even if you start to feel better. General instructions  Wash your hands often using soap and water for at least 20 seconds. If soap and water are not available, use hand sanitizer. Others in the household should wash their hands as well. Hands should be washed: After using the toilet or changing a diaper. Before  preparing, cooking, or serving food. While caring for a sick person or while visiting someone in a hospital. Rest at home while you recover. Take a warm bath to relieve any burning or pain from frequent diarrhea episodes. Watch your condition for any changes. Contact a health care provider if: You have a fever. Your diarrhea gets worse. You have new symptoms. You vomit every time you eat or drink. You feel light-headed, dizzy, or have a headache. You have muscle cramps. You have signs of dehydration, such as: Dark urine, very little urine, or no urine. Cracked lips. Dry mouth. Sunken eyes. Sleepiness. Weakness. You have bloody or black stools or stools that look like tar. You have severe pain, cramping, or bloating in your abdomen. Your skin feels cold and clammy. You feel confused. Get help right away if: You have chest pain or your heart is beating very quickly. You have trouble breathing or you are breathing very quickly. You feel extremely weak or you faint. These symptoms may be an emergency. Get help right away. Call 911. Do not wait to see if the symptoms will go away. Do not drive yourself to the hospital. This information is not intended to replace advice given to you by your health care provider. Make sure you discuss any questions you have with your health care provider. Document Revised: 05/04/2022 Document Reviewed: 05/04/2022 Elsevier Patient Education  2024 [employer] Nausea, Adult Nausea is the feeling of having an upset stomach or that you are about to vomit. Nausea on its own is not usually a serious concern, but it may be an  early sign of a more serious medical problem. As nausea gets worse, it can lead to vomiting. If vomiting develops, or if you are not able to drink enough fluids, you are at risk of becoming dehydrated. Dehydration can make you tired and thirsty, cause you to have a dry mouth, and decrease how often you urinate. Older adults and people  with other diseases or a weak disease-fighting system (immune system) are at higher risk for dehydration. The main goals of treating your nausea are: To relieve your nausea. To limit repeated nausea episodes. To prevent vomiting and dehydration. Follow these instructions at home: Watch your symptoms for any changes. Tell your health care provider about them. Eating and drinking     Take an oral rehydration solution (ORS). This is a drink that is sold at pharmacies and retail stores. Drink clear fluids slowly and in small amounts as you are able. Clear fluids include water, ice chips, low-calorie sports drinks, and fruit juice that has water added (diluted fruit juice). Eat bland, easy-to-digest foods in small amounts as you are able. These foods include bananas, applesauce, rice, lean meats, toast, and crackers. Avoid drinking fluids that contain a lot of sugar or caffeine, such as energy drinks, sports drinks, and soda. Avoid alcohol. Avoid spicy or fatty foods. General instructions Take over-the-counter and prescription medicines only as told by your health care provider. Rest at home while you recover. Drink enough fluid to keep your urine pale yellow. Breathe slowly and deeply when you feel nauseous. Avoid smelling things that have strong odors. Wash your hands often using soap and water for at least 20 seconds. If soap and water are not available, use hand sanitizer. Make sure that everyone in your household washes their hands well and often. Keep all follow-up visits. This is important. Contact a health care provider if: Your nausea gets worse. Your nausea does not go away after two days. You vomit multiple times. You cannot drink fluids without vomiting. You have any of the following: New symptoms. A fever. A headache. Muscle cramps. A rash. Pain while urinating. You feel light-headed or dizzy. Get help right away if: You have pain in your chest, neck, arm, or jaw. You  feel extremely weak or you faint. You have vomit that is bright red or looks like coffee grounds. You have bloody or black stools (feces) or stools that look like tar. You have a severe headache, a stiff neck, or both. You have severe pain, cramping, or bloating in your abdomen. You have difficulty breathing or are breathing very quickly. Your heart is beating very quickly. Your skin feels cold and clammy. You feel confused. You have signs of dehydration, such as: Dark urine, very little urine, or no urine. Cracked lips. Dry mouth. Sunken eyes. Sleepiness. Weakness. These symptoms may be an emergency. Get help right away. Call 911. Do not wait to see if the symptoms will go away. Do not drive yourself to the hospital. Summary Nausea is the feeling that you have an upset stomach or that you are about to vomit. Nausea on its own is not usually a serious concern, but it may be an early sign of a more serious medical problem. If vomiting develops, or if you are not able to drink enough fluids, you are at risk of becoming dehydrated. Follow recommendations for eating and drinking and take over-the-counter and prescription medicines only as told by your health care provider. Contact a health care provider right away if your symptoms worsen  or you have new symptoms. Keep all follow-up visits. This is important. This information is not intended to replace advice given to you by your health care provider. Make sure you discuss any questions you have with your health care provider. Document Revised: 05/22/2021 Document Reviewed: 05/22/2021 Elsevier Patient Education  2024 [employer].

## 2023-08-02 ENCOUNTER — Ambulatory Visit (INDEPENDENT_AMBULATORY_CARE_PROVIDER_SITE_OTHER): Payer: Medicare Other | Admitting: Family Medicine

## 2023-08-02 ENCOUNTER — Encounter: Payer: Self-pay | Admitting: Family Medicine

## 2023-08-02 VITALS — BP 112/66 | HR 52 | Temp 97.7°F | Ht 64.0 in | Wt 160.2 lb

## 2023-08-02 DIAGNOSIS — Z96652 Presence of left artificial knee joint: Secondary | ICD-10-CM | POA: Diagnosis not present

## 2023-08-02 DIAGNOSIS — R110 Nausea: Secondary | ICD-10-CM

## 2023-08-02 DIAGNOSIS — Z96653 Presence of artificial knee joint, bilateral: Secondary | ICD-10-CM | POA: Insufficient documentation

## 2023-08-02 DIAGNOSIS — A419 Sepsis, unspecified organism: Secondary | ICD-10-CM | POA: Diagnosis not present

## 2023-08-02 LAB — URINALYSIS, ROUTINE W REFLEX MICROSCOPIC
Bilirubin Urine: NEGATIVE
Hgb urine dipstick: NEGATIVE
Ketones, ur: NEGATIVE
Leukocytes,Ua: NEGATIVE
Nitrite: NEGATIVE
RBC / HPF: NONE SEEN (ref 0–?)
Specific Gravity, Urine: 1.015 (ref 1.000–1.030)
Total Protein, Urine: NEGATIVE
Urine Glucose: NEGATIVE
Urobilinogen, UA: 0.2 (ref 0.0–1.0)
pH: 7.5 (ref 5.0–8.0)

## 2023-08-02 LAB — CBC WITH DIFFERENTIAL/PLATELET
Basophils Absolute: 0.1 10*3/uL (ref 0.0–0.1)
Basophils Relative: 1.3 % (ref 0.0–3.0)
Eosinophils Absolute: 0.1 10*3/uL (ref 0.0–0.7)
Eosinophils Relative: 1.1 % (ref 0.0–5.0)
HCT: 38 % — ABNORMAL LOW (ref 39.0–52.0)
Hemoglobin: 12.4 g/dL — ABNORMAL LOW (ref 13.0–17.0)
Lymphocytes Relative: 15 % (ref 12.0–46.0)
Lymphs Abs: 1.3 10*3/uL (ref 0.7–4.0)
MCHC: 32.7 g/dL (ref 30.0–36.0)
MCV: 86.7 fl (ref 78.0–100.0)
Monocytes Absolute: 0.7 10*3/uL (ref 0.1–1.0)
Monocytes Relative: 8.5 % (ref 3.0–12.0)
Neutro Abs: 6.2 10*3/uL (ref 1.4–7.7)
Neutrophils Relative %: 74.1 % (ref 43.0–77.0)
Platelets: 478 10*3/uL — ABNORMAL HIGH (ref 150.0–400.0)
RBC: 4.38 Mil/uL (ref 4.22–5.81)
RDW: 15.4 % (ref 11.5–15.5)
WBC: 8.4 10*3/uL (ref 4.0–10.5)

## 2023-08-02 LAB — BASIC METABOLIC PANEL
BUN: 14 mg/dL (ref 6–23)
CO2: 21 meq/L (ref 19–32)
Calcium: 9.7 mg/dL (ref 8.4–10.5)
Chloride: 97 meq/L (ref 96–112)
Creatinine, Ser: 1.03 mg/dL (ref 0.40–1.50)
GFR: 67.98 mL/min (ref >60.00)
Glucose, Bld: 97 mg/dL (ref 70–99)
Potassium: 4.3 meq/L (ref 3.5–5.1)
Sodium: 135 meq/L (ref 135–145)

## 2023-08-02 LAB — MAGNESIUM: Magnesium: 1.8 mg/dL (ref 1.5–2.5)

## 2023-08-02 LAB — PHOSPHORUS: Phosphorus: 3.2 mg/dL (ref 2.3–4.6)

## 2023-08-02 MED ORDER — PROMETHAZINE HCL 25 MG PO TABS: 25.0000 mg | ORAL_TABLET | Freq: Three times a day (TID) | ORAL | 0 refills | Status: DC | PRN

## 2023-08-02 NOTE — Progress Notes (Signed)
[facility] [address] [place] [address] Dept: [phone] Dept Fax: [phone]  Office Visit  Subjective:    [name], male    DOB: 26-Apr-1941, 82 y.o..   MRN: [id number]  Chief Complaint  Patient presents with   Follow-up    F/u after having LT knee surgery.  Having some constipation. Has taken X-lax.     History of Present Illness:  Patient is in today for follow-up form his recent hospitalization. David Goodman was admitted on 8/12-8/13/2024 at [facility] for a left total knee joint replacement. He was readmitted on 8/15-8/21/2024 due to sepsis and a postoperative ileus. Since his return home, he has continued to have some GI issues. He notes that he has had recurrent nausea and vomiting of phlegm. He has had mostly constipation, but also some loose or diarrheal stools. He attributes the later to his use of docusate on top of his Metamucil. He is eating some. He notes he is getting around on his walker. He has had some pain over his bladder with urination.   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 09/09/2022   Gait instability 09/09/2022   Acute right-sided low back pain without sciatica 09/03/2022   Nonallergic rhinitis 08/04/2022   Personal history of colonic polyps 07/22/2022   Gastro-esophageal reflux disease with esophagitis 07/22/2022   Gastric polyp 07/22/2022   Diverticular disease  of colon 07/22/2022   GIB (gastrointestinal bleeding) 04/11/2022   OSA on CPAP 12/13/2021   Vivid dream 09/21/2021   Excessive daytime sleepiness 09/21/2021   Snoring 09/21/2021   Coronary artery disease involving native coronary artery of native heart 09/21/2021   SOBOE (shortness of breath on exertion) 09/21/2021   HFrEF (heart failure with reduced ejection fraction) (HCC)    Bell's palsy, left 06/05/2021   Cerebrovascular disease 06/05/2021   Hiatal hernia 05/29/2021   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   Erectile dysfunction due to arterial insufficiency 02/27/2021   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   Hemorrhoids 07/17/2020   Syncope 11/15/2018   Diverticulitis 05/25/2018   Osteoarthritis of right knee 12/13/2017   OA (osteoarthritis) of knee 12/13/2017   Testosterone deficiency 03/22/2017   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/27/2016   BPH (benign prostatic hyperplasia) 06/18/2013   Viral URI with cough 09/03/2008   HLD (hyperlipidemia) 07/13/2007   Essential hypertension 07/13/2007   Allergic rhinitis 07/13/2007   Peptic ulcer disease 07/13/2007   History of nephrolithiasis 07/13/2007   Past Surgical History:  Procedure Laterality Date   BROW LIFT Bilateral 11/10/2020   Procedure: BLEPHAROPLASTY;  Surgeon: Allena Napoleon, MD;  Location: [facility];  Service: Plastics;  Laterality: Bilateral;  1 hour   COLONOSCOPY N/A 04/12/2022   Procedure: COLONOSCOPY;  Surgeon: Willis Modena, MD;  Location: [facility];  Service: Gastroenterology;  Laterality: N/A;   COLONOSCOPY WITH PROPOFOL N/A 02/17/2019   Procedure: COLONOSCOPY WITH PROPOFOL;  Surgeon: [name], MD;  Location:  [facility];  Service: Gastroenterology;  Laterality: N/A;   CORONARY STENT INTERVENTION N/A 08/25/2021   Procedure: CORONARY STENT INTERVENTION;  Surgeon: Corky Crafts, MD;  Location: [facility]  INVASIVE CV LAB;  Service: Cardiovascular;  Laterality: N/A;   CORONARY ULTRASOUND/IVUS N/A 08/25/2021   Procedure: Intravascular Ultrasound/IVUS;  Surgeon: Corky Crafts, MD;  Location: [facility] INVASIVE CV LAB;  Service: Cardiovascular;  Laterality: N/A;   HYDROCELE EXCISION Right 05/15/2003   IR ANGIOGRAM VISCERAL SELECTIVE  02/16/2019   IR ANGIOGRAM VISCERAL SELECTIVE  02/16/2019   IR ANGIOGRAM VISCERAL SELECTIVE  02/16/2019   IR US GUIDE VASC ACCESS RIGHT  02/16/2019   LEFT HEART CATH AND CORONARY ANGIOGRAPHY N/A 07/28/2021   Procedure: LEFT HEART CATH AND CORONARY ANGIOGRAPHY;  Surgeon: Corky Crafts, MD;  Location: [facility] INVASIVE CV LAB;  Service: Cardiovascular;  Laterality: N/A;   LEFT HEART CATH AND CORONARY ANGIOGRAPHY N/A 08/25/2021   Procedure: LEFT HEART CATH AND CORONARY ANGIOGRAPHY;  Surgeon: Corky Crafts, MD;  Location: [facility] INVASIVE CV LAB;  Service: Cardiovascular;  Laterality: N/A;   PERIPHERAL INTRAVASCULAR LITHOTRIPSY  08/25/2021   Procedure: INTRAVASCULAR LITHOTRIPSY;  Surgeon: Corky Crafts, MD;  Location: [facility] INVASIVE CV LAB;  Service: Cardiovascular;;   ROTATOR CUFF REPAIR     SPERMATOCELECTOMY Right 05/15/2003   TOTAL KNEE ARTHROPLASTY Left 07/11/2023   Procedure: TOTAL KNEE ARTHROPLASTY;  Surgeon: Ollen Gross, MD;  Location: [facility];  Service: Orthopedics;  Laterality: Left;   Family History  Problem Relation Age of Onset   Cancer Brother    Outpatient Medications Prior to Visit  Medication Sig Dispense Refill   Ascorbic Acid (VITAMIN C) 1000 MG tablet Take 1,000 mg by mouth 2 (two) times a week.     clonazepam (KLONOPIN) 0.5 MG disintegrating tablet Take 1 tablet (0.5 mg total) by mouth 2 (two) times daily as needed for up to 15 days (Anxiety). 28 tablet 0   Cyanocobalamin (B-12 PO) Take 1 tablet by mouth daily.     docusate sodium (COLACE) 250 MG capsule Take 250 mg by mouth daily.     hydrochlorothiazide (HYDRODIURIL) 25 MG tablet Take 1 tablet  (25 mg total) by mouth daily. 90 tablet 3   latanoprost (XALATAN) 0.005 % ophthalmic solution Place 1 drop into both eyes at bedtime. 7.5 mL 1   lidocaine (LIDODERM) 5 % Apply by topical route for 30 days.     MAGNESIUM PO Take 400 mg by mouth at bedtime.     methocarbamol (ROBAXIN) 500 MG tablet Take 1 tablet (500 mg total) by mouth every 6 (six) hours as needed for muscle spasms. 40 tablet 0   nitroGLYCERIN (NITROSTAT) 0.4 MG SL tablet Place 1 tablet (0.4 mg total) under the tongue every 5 (five) minutes as needed for chest pain. 25 tablet 3   ondansetron (ZOFRAN) 4 MG tablet Take 1 tablet (4 mg total) by mouth every 6 (six) hours as needed for nausea. (Patient taking differently: Take 4 mg by mouth as needed for nausea or vomiting.) 20 tablet 0   oxyCODONE (OXY IR/ROXICODONE) 5 MG immediate release tablet Take 1-2 tablets (5-10 mg total) by mouth every 8 (eight) hours as needed for severe pain. 42 tablet 0   pantoprazole (PROTONIX) 40 MG tablet Take 1 tablet by mouth once daily 90 tablet 0   phosphorus (K PHOS NEUTRAL) 155-852-130 MG tablet Take 2 tablets (500 mg total) by mouth 2 (two) times daily for 15 days. 60 tablet 0   Polyvinyl Alcohol-Povidone (REFRESH  OP) Place 1 drop into the left eye 2 (two) times daily as needed (dryness).     psyllium (METAMUCIL) 58.6 % powder Take 1 packet by mouth daily.     rosuvastatin (CRESTOR) 20 MG tablet Take 1 tablet by mouth once daily 90 tablet 3   tadalafil (CIALIS) 20 MG tablet Take 1 tablet (20 mg total) by mouth daily as needed for erectile dysfunction. 30 tablet 2   tamsulosin (FLOMAX) 0.4 MG CAPS capsule TAKE 1 CAPSULE BY MOUTH DAILY 100 capsule 2   traMADol (ULTRAM) 50 MG tablet Take 1-2 tablets (50-100 mg total) by mouth every 6 (six) hours as needed for moderate pain. 40 tablet 0   valsartan (DIOVAN) 320 MG tablet Take 1 tablet by mouth once daily 90 tablet 0   zinc gluconate 50 MG tablet Take 50 mg by mouth daily.     ondansetron (ZOFRAN) 4 MG  tablet Take 1 tablet (4 mg total) by mouth every 8 (eight) hours as needed for nausea or vomiting. 20 tablet 0   No facility-administered medications prior to visit.   No Known Allergies   Objective:   Today's Vitals   08/02/23 1107  BP: 112/66  Pulse: (!) 52  Temp: 97.7 F (36.5 C)  TempSrc: Temporal  SpO2: 99%  Weight: 160 lb 3.2 oz (72.7 kg)  Height: 5\' 4"  (1.626 m)   Body mass index is 27.5 kg/m.   General: Well developed, well nourished. No acute distress. Extremities: Surgical wound appears to be healing well over the right knee joint. There is some increased warmth   over the joint. No significant lower leg edema. Psych: Alert and oriented. Normal mood and affect.  There are no preventive care reminders to display for this patient.    Assessment & Plan:   Problem List Items Addressed This Visit       Other   Knee joint replacement status, left    Improving since surgery. Mr. Bayerl should continue to work with orthopedics regarding his recovery and continue PT. I recommend he stop use of opioids and try to maintain on Tylenol for pain management.      Sepsis (HCC) - Primary    Currently, vital signs are normal with no SIRS criteria. I will check labs as requested by the discharge physician.      Relevant Orders   Basic metabolic panel   CBC with Differential/Platelet   Phosphorus   Magnesium   Urinalysis, Routine w reflex microscopic   Other Visit Diagnoses     Nausea       Possibly due to opioid use. We discussed him stopping his opioids for now. As the Zofran has not been very effective, we will try Phenergan instead.   Relevant Medications   promethazine (PHENERGAN) 25 MG tablet      Return in about 4 weeks (around 08/30/2023) for Reassessment.   Loyola Mast, MD

## 2023-08-02 NOTE — Assessment & Plan Note (Signed)
Currently, vital signs are normal with no SIRS criteria. I will check labs as requested by the discharge physician.

## 2023-08-02 NOTE — Assessment & Plan Note (Signed)
Improving since surgery. David Goodman should continue to work with orthopedics regarding his recovery and continue PT. I recommend he stop use of opioids and try to maintain on Tylenol for pain management.

## 2023-08-03 ENCOUNTER — Ambulatory Visit: Payer: Medicare Other | Admitting: Family Medicine

## 2023-08-03 DIAGNOSIS — M25562 Pain in left knee: Secondary | ICD-10-CM | POA: Diagnosis not present

## 2023-08-04 ENCOUNTER — Telehealth: Payer: Self-pay | Admitting: Family Medicine

## 2023-08-04 ENCOUNTER — Other Ambulatory Visit: Payer: Self-pay | Admitting: Interventional Cardiology

## 2023-08-04 NOTE — Telephone Encounter (Signed)
Spoke to patient and advised that his blood work was over all normal with nothing to worry about.  Dm/cma

## 2023-08-04 NOTE — Telephone Encounter (Signed)
Lft VM to rtn call. Dm/cma  

## 2023-08-04 NOTE — Telephone Encounter (Signed)
Pt is wanting a cb concerning one of his lab results. Please advise pt at  [phone]

## 2023-08-05 DIAGNOSIS — M25562 Pain in left knee: Secondary | ICD-10-CM | POA: Diagnosis not present

## 2023-08-09 DIAGNOSIS — M25562 Pain in left knee: Secondary | ICD-10-CM | POA: Diagnosis not present

## 2023-08-12 DIAGNOSIS — M25562 Pain in left knee: Secondary | ICD-10-CM | POA: Diagnosis not present

## 2023-08-13 ENCOUNTER — Other Ambulatory Visit: Payer: Self-pay | Admitting: Family Medicine

## 2023-08-13 DIAGNOSIS — I10 Essential (primary) hypertension: Secondary | ICD-10-CM

## 2023-08-15 ENCOUNTER — Ambulatory Visit (INDEPENDENT_AMBULATORY_CARE_PROVIDER_SITE_OTHER): Payer: Medicare Other | Admitting: Family Medicine

## 2023-08-15 ENCOUNTER — Encounter: Payer: Self-pay | Admitting: Family Medicine

## 2023-08-15 VITALS — BP 114/62 | HR 51 | Temp 98.5°F | Wt 162.8 lb

## 2023-08-15 DIAGNOSIS — R112 Nausea with vomiting, unspecified: Secondary | ICD-10-CM | POA: Diagnosis not present

## 2023-08-15 DIAGNOSIS — F5101 Primary insomnia: Secondary | ICD-10-CM | POA: Diagnosis not present

## 2023-08-15 DIAGNOSIS — K529 Noninfective gastroenteritis and colitis, unspecified: Secondary | ICD-10-CM

## 2023-08-15 DIAGNOSIS — R109 Unspecified abdominal pain: Secondary | ICD-10-CM | POA: Diagnosis not present

## 2023-08-15 MED ORDER — TRAZODONE HCL 50 MG PO TABS
25.0000 mg | ORAL_TABLET | Freq: Every evening | ORAL | 3 refills | Status: DC | PRN
Start: 2023-08-15 — End: 2023-09-05

## 2023-08-15 NOTE — Assessment & Plan Note (Signed)
I will try him on trazodone to see if this will improve sleep maintenance.

## 2023-08-15 NOTE — Progress Notes (Signed)
[facility] [address] [place] [address] Dept: [phone] Dept Fax: [phone]  Office Visit  Subjective:    [name], male    DOB: 11/13/1941, 82 y.o..   MRN: [id number]  Chief Complaint  Patient presents with   Abdominal Pain    Persistent abdominal pain, nausea, emesis, occasional diarrhea. Discuss getting medication to help sleeping. PT exclaims of feeling full, requesting endoscopy referral. Treating abdominal pain with ice   History of Present Illness:  Patient is in today with ongoing issues with nausea and intestinal cramping. I had seen [name] on 9/3 in follow-up from a hospitalization secondary to sepsis. During the hospitalization, he had experienced some postoperative ileus resulting from a left total knee joint replacement. At follow-up, he complained of recurrent nausea and vomiting of phlegm. He had mostly constipation, but also some loose or diarrheal stools.  He continues to complain of these similar symptoms. He is no longer on opioids. He has a past history of diverticulitis, though this was associated with hematochezia, which he is not having. He also denies any melena. He has a more distant history of a stomach ulcer.   [name] notes difficulty he has been having with staying asleep. He notes he was prescribed clonazepam. This used to work well for him, but not any longer. He has tried an OTC medicine and melatonin, neither of which helped.  Past Medical History: Patient Active Problem List   Diagnosis Date Noted   Primary insomnia 08/15/2023   Knee joint replacement status, left 08/02/2023   Sepsis (HCC) 07/15/2023   SIRS (systemic inflammatory response syndrome) (HCC) 07/14/2023   Postoperative ileus (HCC) 07/14/2023   Primary osteoarthritis of left knee 07/11/2023   Rhinitis due to continuous positive airway pressure (CPAP) 03/30/2023   Deviated septum 12/16/2022   Chronic  nasal congestion 12/16/2022   Hypertensive retinopathy 09/27/2022   Posterior vitreous detachment of right eye 09/27/2022   Spondylosis of lumbar spine 09/09/2022   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 09/09/2022   Gait instability 09/09/2022   Acute right-sided low back pain without sciatica 09/03/2022   Nonallergic rhinitis 08/04/2022   Personal history of colonic polyps 07/22/2022   Gastro-esophageal reflux disease with esophagitis 07/22/2022   Gastric polyp 07/22/2022   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   GIB (gastrointestinal bleeding) 04/11/2022   OSA on CPAP 12/13/2021   Vivid dream 09/21/2021   Excessive daytime sleepiness 09/21/2021   Snoring 09/21/2021   Coronary artery disease involving native coronary artery of native heart 09/21/2021   SOBOE (shortness of breath on exertion) 09/21/2021   HFrEF (heart failure with reduced ejection fraction) (HCC)    Bell's palsy, left 06/05/2021   Cerebrovascular disease 06/05/2021   Hiatal hernia 05/29/2021   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   Erectile dysfunction due to arterial insufficiency 02/27/2021   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   Hemorrhoids 07/17/2020   Syncope 11/15/2018   Diverticulitis 05/25/2018   Osteoarthritis of right knee 12/13/2017   OA (osteoarthritis) of knee 12/13/2017   Testosterone deficiency 03/22/2017   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/27/2016   BPH (benign prostatic hyperplasia) 06/18/2013   Viral URI with cough 09/03/2008   HLD (hyperlipidemia) 07/13/2007   Essential hypertension 07/13/2007   Allergic rhinitis 07/13/2007   Peptic ulcer disease 07/13/2007   History of nephrolithiasis 07/13/2007   Past Surgical History:  Procedure Laterality Date   BROW LIFT Bilateral  11/10/2020   Procedure: BLEPHAROPLASTY;  Surgeon: Allena Napoleon, MD;  Location: [facility];  Service: Plastics;   Laterality: Bilateral;  1 hour   COLONOSCOPY N/A 04/12/2022   Procedure: COLONOSCOPY;  Surgeon: Willis Modena, MD;  Location: [facility];  Service: Gastroenterology;  Laterality: N/A;   COLONOSCOPY WITH PROPOFOL N/A 02/17/2019   Procedure: COLONOSCOPY WITH PROPOFOL;  Surgeon: [name], MD;  Location: [facility];  Service: Gastroenterology;  Laterality: N/A;   CORONARY STENT INTERVENTION N/A 08/25/2021   Procedure: CORONARY STENT INTERVENTION;  Surgeon: Corky Crafts, MD;  Location: [facility] INVASIVE CV LAB;  Service: Cardiovascular;  Laterality: N/A;   CORONARY ULTRASOUND/IVUS N/A 08/25/2021   Procedure: Intravascular Ultrasound/IVUS;  Surgeon: Corky Crafts, MD;  Location: [facility] INVASIVE CV LAB;  Service: Cardiovascular;  Laterality: N/A;   HYDROCELE EXCISION Right 05/15/2003   IR ANGIOGRAM VISCERAL SELECTIVE  02/16/2019   IR ANGIOGRAM VISCERAL SELECTIVE  02/16/2019   IR ANGIOGRAM VISCERAL SELECTIVE  02/16/2019   IR US GUIDE VASC ACCESS RIGHT  02/16/2019   LEFT HEART CATH AND CORONARY ANGIOGRAPHY N/A 07/28/2021   Procedure: LEFT HEART CATH AND CORONARY ANGIOGRAPHY;  Surgeon: Corky Crafts, MD;  Location: [facility] INVASIVE CV LAB;  Service: Cardiovascular;  Laterality: N/A;   LEFT HEART CATH AND CORONARY ANGIOGRAPHY N/A 08/25/2021   Procedure: LEFT HEART CATH AND CORONARY ANGIOGRAPHY;  Surgeon: Corky Crafts, MD;  Location: [facility] INVASIVE CV LAB;  Service: Cardiovascular;  Laterality: N/A;   PERIPHERAL INTRAVASCULAR LITHOTRIPSY  08/25/2021   Procedure: INTRAVASCULAR LITHOTRIPSY;  Surgeon: Corky Crafts, MD;  Location: [facility] INVASIVE CV LAB;  Service: Cardiovascular;;   ROTATOR CUFF REPAIR     SPERMATOCELECTOMY Right 05/15/2003   TOTAL KNEE ARTHROPLASTY Left 07/11/2023   Procedure: TOTAL KNEE ARTHROPLASTY;  Surgeon: Ollen Gross, MD;  Location: [facility];  Service: Orthopedics;  Laterality: Left;   Family History  Problem Relation Age of Onset   Cancer Brother    Outpatient  Medications Prior to Visit  Medication Sig Dispense Refill   Ascorbic Acid (VITAMIN C) 1000 MG tablet Take 1,000 mg by mouth 2 (two) times a week.     Cyanocobalamin (B-12 PO) Take 1 tablet by mouth daily.     docusate sodium (COLACE) 250 MG capsule Take 250 mg by mouth daily.     hydrochlorothiazide (HYDRODIURIL) 25 MG tablet TAKE 1 TABLET BY MOUTH DAILY 100 tablet 2   latanoprost (XALATAN) 0.005 % ophthalmic solution Place 1 drop into both eyes at bedtime. 7.5 mL 1   lidocaine (LIDODERM) 5 % Apply by topical route for 30 days.     MAGNESIUM PO Take 400 mg by mouth at bedtime.     methocarbamol (ROBAXIN) 500 MG tablet Take 1 tablet (500 mg total) by mouth every 6 (six) hours as needed for muscle spasms. 40 tablet 0   nitroGLYCERIN (NITROSTAT) 0.4 MG SL tablet Place 1 tablet (0.4 mg total) under the tongue every 5 (five) minutes as needed for chest pain. 25 tablet 3   ondansetron (ZOFRAN) 4 MG tablet Take 1 tablet (4 mg total) by mouth every 6 (six) hours as needed for nausea. (Patient taking differently: Take 4 mg by mouth as needed for nausea or vomiting.) 20 tablet 0   oxyCODONE (OXY IR/ROXICODONE) 5 MG immediate release tablet Take 1-2 tablets (5-10 mg total) by mouth every 8 (eight) hours as needed for severe pain. 42 tablet 0   pantoprazole (PROTONIX) 40 MG tablet Take 1 tablet by mouth once  daily 90 tablet 0   Polyvinyl Alcohol-Povidone (REFRESH OP) Place 1 drop into the left eye 2 (two) times daily as needed (dryness).     promethazine (PHENERGAN) 25 MG tablet Take 1 tablet (25 mg total) by mouth every 8 (eight) hours as needed for nausea or vomiting. 20 tablet 0   psyllium (METAMUCIL) 58.6 % powder Take 1 packet by mouth daily.     rosuvastatin (CRESTOR) 20 MG tablet Take 1 tablet by mouth once daily 90 tablet 3   tadalafil (CIALIS) 20 MG tablet Take 1 tablet (20 mg total) by mouth daily as needed for erectile dysfunction. 30 tablet 2   tamsulosin (FLOMAX) 0.4 MG CAPS capsule TAKE 1  CAPSULE BY MOUTH DAILY 100 capsule 2   valsartan (DIOVAN) 320 MG tablet Take 1 tablet by mouth once daily 90 tablet 0   zinc gluconate 50 MG tablet Take 50 mg by mouth daily.     traMADol (ULTRAM) 50 MG tablet Take 1-2 tablets (50-100 mg total) by mouth every 6 (six) hours as needed for moderate pain. 40 tablet 0   clonazepam (KLONOPIN) 0.5 MG disintegrating tablet Take 1 tablet (0.5 mg total) by mouth 2 (two) times daily as needed for up to 15 days (Anxiety). 28 tablet 0   No facility-administered medications prior to visit.   No Known Allergies   Objective:   Today's Vitals   08/15/23 1718  BP: 114/62  Pulse: (!) 51  Temp: 98.5 F (36.9 C)  SpO2: 97%  Weight: 162 lb 12.8 oz (73.8 kg)   Body mass index is 27.94 kg/m.   General: Well developed, well nourished. No acute distress. Abdomen: Soft. Bowel sounds positive, normal pitch but increased frequency. No hepatosplenomegaly. Mild RUQ   tenderness. Negative Murphy's sign. No rebound or guarding. Psych: Alert and oriented. Normal mood and affect.  There are no preventive care reminders to display for this patient.  Lab Results    Latest Ref Rng & Units 08/02/2023   11:46 AM 07/20/2023    4:30 AM 07/19/2023    4:31 AM  BMP  Glucose 70 - 99 mg/dL 97  161  096   BUN 6 - 23 mg/dL 14  8  8    Creatinine 0.40 - 1.50 mg/dL 0.45  4.09  8.11   Sodium 135 - 145 mEq/L 135  134  134   Potassium 3.5 - 5.1 mEq/L 4.3  4.1  3.6   Chloride 96 - 112 mEq/L 97  100  104   CO2 19 - 32 mEq/L 21  23  23    Calcium 8.4 - 10.5 mg/dL 9.7  8.4  7.5       Latest Ref Rng & Units 08/02/2023   11:46 AM 07/20/2023    4:30 AM 07/19/2023    4:31 AM  CBC  WBC 4.0 - 10.5 K/uL 8.4  11.6  9.0   Hemoglobin 13.0 - 17.0 g/dL 91.4  9.7  8.8   Hematocrit 39.0 - 52.0 % 38.0  28.9  26.1   Platelets 150.0 - 400.0 K/uL 478.0  329  286      Assessment & Plan:   Problem List Items Addressed This Visit       Other   Primary insomnia    I will try him on trazodone  to see if this will improve sleep maintenance.      Relevant Medications   traZODone (DESYREL) 50 MG tablet   Other Visit Diagnoses     Abdominal cramping    -  Primary   Pain appears more RUQ, but without physcial signs of gallbladder disease. In light of history of diverticular disease, I recommend a CT scan of the abdomen.   Relevant Orders   Ambulatory referral to Gastroenterology   Nausea and vomiting, unspecified vomiting type       As above.   Relevant Orders   CT ABDOMEN PELVIS W CONTRAST   Ambulatory referral to Gastroenterology   Colitis       Recent history of colitis as possible source of sepsis. I will refer to GI for follow-up.       Return if symptoms worsen or fail to improve.   Loyola Mast, MD

## 2023-08-16 DIAGNOSIS — Z4789 Encounter for other orthopedic aftercare: Secondary | ICD-10-CM | POA: Diagnosis not present

## 2023-08-24 ENCOUNTER — Encounter: Payer: Self-pay | Admitting: Family Medicine

## 2023-08-24 ENCOUNTER — Ambulatory Visit: Payer: Medicare Other | Admitting: Family Medicine

## 2023-08-24 VITALS — BP 132/74 | HR 60 | Temp 97.8°F | Ht 64.0 in | Wt 165.4 lb

## 2023-08-24 DIAGNOSIS — R112 Nausea with vomiting, unspecified: Secondary | ICD-10-CM

## 2023-08-24 DIAGNOSIS — Z96652 Presence of left artificial knee joint: Secondary | ICD-10-CM

## 2023-08-24 MED ORDER — PROMETHAZINE HCL 25 MG PO TABS
25.0000 mg | ORAL_TABLET | Freq: Three times a day (TID) | ORAL | 0 refills | Status: DC | PRN
Start: 2023-08-24 — End: 2023-10-21

## 2023-08-24 NOTE — Assessment & Plan Note (Signed)
Improving since surgery. I recommend he give some more time for recovery before evaluating the hip further.

## 2023-08-24 NOTE — Progress Notes (Signed)
[facility] PRIMARY CARE [facility] [address] [place] [address] Dept: [phone] Dept Fax: [phone]  Chronic Care Office Visit  Subjective:    [name], male    DOB: 14-Feb-1941, 82 y.o..   MRN: [id number]  Chief Complaint  Patient presents with   Follow-up    4 week f/u.  Still having stomach issues and hip pain.    History of Present Illness:  Patient is in today for reassessment of chronic medical issues.  I saw David Goodman on 9/3 in follow-up from a hospitalization secondary to sepsis. During the hospitalization, he had experienced some postoperative ileus resulting from a left total knee joint replacement. At follow-up, he complained of recurrent nausea and vomiting of phlegm. He had been mostly constipated, but also has had some loose or diarrheal stools.  He continues to complain of these similar symptoms. He is no longer on opioids. He has a past history of diverticulitis, though this was associated with hematochezia, which he is not having. He also denies any melena. He has a more distant history of a stomach ulcer. I recently referred him to GI and have ordered a CT scan of the abdomen, both of which are pending for next week.  David Goodman's left knee joint replacement is recovering well. He notes he has been released from PT. He is having some hip pains and still needs ot use a cane to help with ambulation.  Past Medical History: Patient Active Problem List   Diagnosis Date Noted   Primary insomnia 08/15/2023   Knee joint replacement status, left 08/02/2023   Sepsis (HCC) 07/15/2023   SIRS (systemic inflammatory response syndrome) (HCC) 07/14/2023   Postoperative ileus (HCC) 07/14/2023   Primary osteoarthritis of left knee 07/11/2023   Rhinitis due to continuous positive airway pressure (CPAP) 03/30/2023   Deviated septum 12/16/2022   Chronic nasal congestion 12/16/2022   Hypertensive retinopathy 09/27/2022    Posterior vitreous detachment of right eye 09/27/2022   Spondylosis of lumbar spine 09/09/2022   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 09/09/2022   Gait instability 09/09/2022   Acute right-sided low back pain without sciatica 09/03/2022   Nonallergic rhinitis 08/04/2022   Personal history of colonic polyps 07/22/2022   Gastro-esophageal reflux disease with esophagitis 07/22/2022   Gastric polyp 07/22/2022   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   GIB (gastrointestinal bleeding) 04/11/2022   OSA on CPAP 12/13/2021   Vivid dream 09/21/2021   Excessive daytime sleepiness 09/21/2021   Snoring 09/21/2021   Coronary artery disease involving native coronary artery of native heart 09/21/2021   SOBOE (shortness of breath on exertion) 09/21/2021   HFrEF (heart failure with reduced ejection fraction) (HCC)    Bell's palsy, left 06/05/2021   Cerebrovascular disease 06/05/2021   Hiatal hernia 05/29/2021   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   Erectile dysfunction due to arterial insufficiency 02/27/2021   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   Hemorrhoids 07/17/2020   Syncope 11/15/2018   Diverticulitis 05/25/2018   Osteoarthritis of right knee 12/13/2017   OA (osteoarthritis) of knee 12/13/2017   Testosterone deficiency 03/22/2017   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/27/2016   BPH (benign prostatic hyperplasia) 06/18/2013   Viral URI with cough 09/03/2008   HLD (hyperlipidemia) 07/13/2007   Essential hypertension 07/13/2007   Allergic rhinitis 07/13/2007   Peptic ulcer disease 07/13/2007   History of nephrolithiasis 07/13/2007   Past Surgical History:  Procedure Laterality Date  BROW LIFT Bilateral 11/10/2020   Procedure: BLEPHAROPLASTY;  Surgeon: Allena Napoleon, MD;  Location: [facility];  Service: Plastics;  Laterality: Bilateral;  1 hour   COLONOSCOPY N/A 04/12/2022   Procedure:  COLONOSCOPY;  Surgeon: Willis Modena, MD;  Location: [facility];  Service: Gastroenterology;  Laterality: N/A;   COLONOSCOPY WITH PROPOFOL N/A 02/17/2019   Procedure: COLONOSCOPY WITH PROPOFOL;  Surgeon: [name], MD;  Location: [facility];  Service: Gastroenterology;  Laterality: N/A;   CORONARY STENT INTERVENTION N/A 08/25/2021   Procedure: CORONARY STENT INTERVENTION;  Surgeon: Corky Crafts, MD;  Location: [facility] INVASIVE CV LAB;  Service: Cardiovascular;  Laterality: N/A;   CORONARY ULTRASOUND/IVUS N/A 08/25/2021   Procedure: Intravascular Ultrasound/IVUS;  Surgeon: Corky Crafts, MD;  Location: [facility] INVASIVE CV LAB;  Service: Cardiovascular;  Laterality: N/A;   HYDROCELE EXCISION Right 05/15/2003   IR ANGIOGRAM VISCERAL SELECTIVE  02/16/2019   IR ANGIOGRAM VISCERAL SELECTIVE  02/16/2019   IR ANGIOGRAM VISCERAL SELECTIVE  02/16/2019   IR US GUIDE VASC ACCESS RIGHT  02/16/2019   LEFT HEART CATH AND CORONARY ANGIOGRAPHY N/A 07/28/2021   Procedure: LEFT HEART CATH AND CORONARY ANGIOGRAPHY;  Surgeon: Corky Crafts, MD;  Location: [facility] INVASIVE CV LAB;  Service: Cardiovascular;  Laterality: N/A;   LEFT HEART CATH AND CORONARY ANGIOGRAPHY N/A 08/25/2021   Procedure: LEFT HEART CATH AND CORONARY ANGIOGRAPHY;  Surgeon: Corky Crafts, MD;  Location: [facility] INVASIVE CV LAB;  Service: Cardiovascular;  Laterality: N/A;   PERIPHERAL INTRAVASCULAR LITHOTRIPSY  08/25/2021   Procedure: INTRAVASCULAR LITHOTRIPSY;  Surgeon: Corky Crafts, MD;  Location: [facility] INVASIVE CV LAB;  Service: Cardiovascular;;   ROTATOR CUFF REPAIR     SPERMATOCELECTOMY Right 05/15/2003   TOTAL KNEE ARTHROPLASTY Left 07/11/2023   Procedure: TOTAL KNEE ARTHROPLASTY;  Surgeon: Ollen Gross, MD;  Location: [facility];  Service: Orthopedics;  Laterality: Left;   Family History  Problem Relation Age of Onset   Cancer Brother    Outpatient Medications Prior to Visit  Medication Sig Dispense Refill   Ascorbic Acid  (VITAMIN C) 1000 MG tablet Take 1,000 mg by mouth 2 (two) times a week.     Cyanocobalamin (B-12 PO) Take 1 tablet by mouth daily.     docusate sodium (COLACE) 250 MG capsule Take 250 mg by mouth daily.     hydrochlorothiazide (HYDRODIURIL) 25 MG tablet TAKE 1 TABLET BY MOUTH DAILY 100 tablet 2   latanoprost (XALATAN) 0.005 % ophthalmic solution Place 1 drop into both eyes at bedtime. 7.5 mL 1   lidocaine (LIDODERM) 5 % Apply by topical route for 30 days.     MAGNESIUM PO Take 400 mg by mouth at bedtime.     methocarbamol (ROBAXIN) 500 MG tablet Take 1 tablet (500 mg total) by mouth every 6 (six) hours as needed for muscle spasms. 40 tablet 0   nitroGLYCERIN (NITROSTAT) 0.4 MG SL tablet Place 1 tablet (0.4 mg total) under the tongue every 5 (five) minutes as needed for chest pain. 25 tablet 3   ondansetron (ZOFRAN) 4 MG tablet Take 1 tablet (4 mg total) by mouth every 6 (six) hours as needed for nausea. (Patient taking differently: Take 4 mg by mouth as needed for nausea or vomiting.) 20 tablet 0   pantoprazole (PROTONIX) 40 MG tablet Take 1 tablet by mouth once daily 90 tablet 0   Polyvinyl Alcohol-Povidone (REFRESH OP) Place 1 drop into the left eye 2 (two) times daily as needed (dryness).  psyllium (METAMUCIL) 58.6 % powder Take 1 packet by mouth daily.     rosuvastatin (CRESTOR) 20 MG tablet Take 1 tablet by mouth once daily 90 tablet 3   tadalafil (CIALIS) 20 MG tablet Take 1 tablet (20 mg total) by mouth daily as needed for erectile dysfunction. 30 tablet 2   tamsulosin (FLOMAX) 0.4 MG CAPS capsule TAKE 1 CAPSULE BY MOUTH DAILY 100 capsule 2   traZODone (DESYREL) 50 MG tablet Take 0.5-1 tablets (25-50 mg total) by mouth at bedtime as needed for sleep. 30 tablet 3   valsartan (DIOVAN) 320 MG tablet Take 1 tablet by mouth once daily 90 tablet 0   zinc gluconate 50 MG tablet Take 50 mg by mouth daily.     promethazine (PHENERGAN) 25 MG tablet Take 1 tablet (25 mg total) by mouth every 8  (eight) hours as needed for nausea or vomiting. 20 tablet 0   oxyCODONE (OXY IR/ROXICODONE) 5 MG immediate release tablet Take 1-2 tablets (5-10 mg total) by mouth every 8 (eight) hours as needed for severe pain. 42 tablet 0   No facility-administered medications prior to visit.   No Known Allergies Objective:   Today's Vitals   08/24/23 1305  BP: 132/74  Pulse: 60  Temp: 97.8 F (36.6 C)  TempSrc: Temporal  SpO2: 99%  Weight: 165 lb 6.4 oz (75 kg)  Height: 5\' 4"  (1.626 m)   Body mass index is 28.39 kg/m.   General: Well developed, well nourished. No acute distress. Psych: Alert and oriented. Normal mood and affect.  Health Maintenance Due  Topic Date Due   COVID-19 Vaccine (5 - 2023-24 season) 07/31/2023     Assessment & Plan:   Problem List Items Addressed This Visit       Other   Knee joint replacement status, left    Improving since surgery. I recommend he give some more time for recovery before evaluating the hip further.      Other Visit Diagnoses     Nausea and vomiting, unspecified vomiting type    -  Primary   CT of abdomen and GI consult are still pending. He is stable over the past two weeks. Continue phenergan as needed.   Relevant Medications   promethazine (PHENERGAN) 25 MG tablet       Return for Follow-up after testing/imaging.   Loyola Mast, MD

## 2023-08-29 ENCOUNTER — Ambulatory Visit: Payer: Medicare Other | Admitting: Interventional Cardiology

## 2023-08-29 ENCOUNTER — Ambulatory Visit: Payer: Medicare Other | Admitting: Family Medicine

## 2023-08-29 DIAGNOSIS — R109 Unspecified abdominal pain: Secondary | ICD-10-CM | POA: Diagnosis not present

## 2023-08-29 DIAGNOSIS — Z8679 Personal history of other diseases of the circulatory system: Secondary | ICD-10-CM | POA: Diagnosis not present

## 2023-08-29 DIAGNOSIS — R112 Nausea with vomiting, unspecified: Secondary | ICD-10-CM | POA: Diagnosis not present

## 2023-08-29 DIAGNOSIS — R140 Abdominal distension (gaseous): Secondary | ICD-10-CM | POA: Diagnosis not present

## 2023-08-29 DIAGNOSIS — I2510 Atherosclerotic heart disease of native coronary artery without angina pectoris: Secondary | ICD-10-CM | POA: Diagnosis not present

## 2023-08-31 DIAGNOSIS — M1711 Unilateral primary osteoarthritis, right knee: Secondary | ICD-10-CM | POA: Diagnosis not present

## 2023-09-01 ENCOUNTER — Ambulatory Visit
Admission: RE | Admit: 2023-09-01 | Discharge: 2023-09-01 | Disposition: A | Discharge status: Home / Self Care | Payer: Medicare Other | Source: Ambulatory Visit | Attending: Family Medicine | Admitting: Family Medicine

## 2023-09-01 DIAGNOSIS — R112 Nausea with vomiting, unspecified: Secondary | ICD-10-CM

## 2023-09-01 DIAGNOSIS — K5730 Diverticulosis of large intestine without perforation or abscess without bleeding: Secondary | ICD-10-CM | POA: Diagnosis not present

## 2023-09-01 DIAGNOSIS — I700 Atherosclerosis of aorta: Secondary | ICD-10-CM | POA: Diagnosis not present

## 2023-09-01 DIAGNOSIS — R1084 Generalized abdominal pain: Secondary | ICD-10-CM | POA: Diagnosis not present

## 2023-09-01 MED ORDER — IOPAMIDOL (ISOVUE-300) INJECTION 61%
500.0000 mL | Freq: Once | INTRAVENOUS | Status: AC | PRN
  Administered 2023-09-01: 100 mL via INTRAVENOUS

## 2023-09-05 ENCOUNTER — Ambulatory Visit (INDEPENDENT_AMBULATORY_CARE_PROVIDER_SITE_OTHER): Payer: Medicare Other | Admitting: Family Medicine

## 2023-09-05 ENCOUNTER — Encounter: Payer: Self-pay | Admitting: Family Medicine

## 2023-09-05 VITALS — BP 120/70 | HR 71 | Temp 98.4°F | Ht 64.0 in | Wt 159.6 lb

## 2023-09-05 DIAGNOSIS — R109 Unspecified abdominal pain: Secondary | ICD-10-CM

## 2023-09-05 DIAGNOSIS — K921 Melena: Secondary | ICD-10-CM | POA: Diagnosis not present

## 2023-09-05 NOTE — Progress Notes (Signed)
[facility]IMARY CARE [facility] [address] [place] [address] Dept: [phone] Dept Fax: [phone]  Chronic Care Office Visit  Subjective:    [name], male    DOB: 04/10/1941, 82 y.o..   MRN: [id number]  Chief Complaint  Patient presents with   Follow-up    F/u after CT abdomen.  Still having stomach issues and has noticed black stools x 2 days.     History of Present Illness:  Patient is in today for reassessment of chronic medical issues.  I saw David Goodman on 9/3 in follow-up from a hospitalization secondary to sepsis. During the hospitalization, he had experienced some postoperative ileus resulting from a left total knee joint replacement. At follow-up, he complained of recurrent nausea and vomiting of phlegm. He had been mostly constipated, but also has had some loose or diarrheal stools.  He continues to complain of these similar symptoms. He has a past history of diverticulitis, though this was associated with hematochezia, which he is not having. He has had a prior peptic ulcer and was previously treated for H. pylori infection. In the past two days, he has noted melena. He denies any use of Pepto-Bismol. I recently referred him to GI and have ordered a CT scan of the abdomen. He had an initial visit at GI last week. His CT result is still pending.  Past Medical History: Patient Active Problem List   Diagnosis Date Noted   Primary insomnia 08/15/2023   Knee joint replacement status, left 08/02/2023   Sepsis (HCC) 07/15/2023   SIRS (systemic inflammatory response syndrome) (HCC) 07/14/2023   Postoperative ileus (HCC) 07/14/2023   Primary osteoarthritis of left knee 07/11/2023   Rhinitis due to continuous positive airway pressure (CPAP) 03/30/2023   Deviated septum 12/16/2022   Chronic nasal congestion 12/16/2022   Hypertensive retinopathy 09/27/2022   Posterior vitreous detachment of right eye 09/27/2022    Spondylosis of lumbar spine 09/09/2022   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 09/09/2022   Gait instability 09/09/2022   Acute right-sided low back pain without sciatica 09/03/2022   Nonallergic rhinitis 08/04/2022   History of colonic polyps 07/22/2022   Gastro-esophageal reflux disease with esophagitis 07/22/2022   Gastric polyp 07/22/2022   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   GIB (gastrointestinal bleeding) 04/11/2022   OSA on CPAP 12/13/2021   Vivid dream 09/21/2021   Excessive daytime sleepiness 09/21/2021   Snoring 09/21/2021   Coronary artery disease involving native coronary artery of native heart 09/21/2021   SOBOE (shortness of breath on exertion) 09/21/2021   HFrEF (heart failure with reduced ejection fraction) (HCC)    Bell's palsy, left 06/05/2021   Cerebrovascular disease 06/05/2021   Hiatal hernia 05/29/2021   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   Erectile dysfunction due to arterial insufficiency 02/27/2021   Hemorrhoids 07/17/2020   Syncope 11/15/2018   Diverticulitis 05/25/2018   Osteoarthritis of right knee 12/13/2017   OA (osteoarthritis) of knee 12/13/2017   Testosterone deficiency 03/22/2017   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/27/2016   BPH (benign prostatic hyperplasia) 06/18/2013   Viral URI with cough 09/03/2008   HLD (hyperlipidemia) 07/13/2007   Essential hypertension 07/13/2007   Allergic rhinitis 07/13/2007   Peptic ulcer disease 07/13/2007   History of nephrolithiasis 07/13/2007   Past Surgical History:  Procedure Laterality Date   BROW LIFT Bilateral 11/10/2020   Procedure: BLEPHAROPLASTY;  Surgeon: David Napoleon, MD;  Location: [facility];  Service: Government social research officer;  Laterality: Bilateral;  1 hour   COLONOSCOPY N/A 04/12/2022   Procedure: COLONOSCOPY;  Surgeon: David Modena, MD;  Location: [facility];  Service: Gastroenterology;  Laterality: N/A;   COLONOSCOPY WITH  PROPOFOL N/A 02/17/2019   Procedure: COLONOSCOPY WITH PROPOFOL;  Surgeon: David Salen, MD;  Location: [facility];  Service: Gastroenterology;  Laterality: N/A;   CORONARY STENT INTERVENTION N/A 08/25/2021   Procedure: CORONARY STENT INTERVENTION;  Surgeon: David Crafts, MD;  Location: [facility] INVASIVE CV LAB;  Service: Cardiovascular;  Laterality: N/A;   CORONARY ULTRASOUND/IVUS N/A 08/25/2021   Procedure: Intravascular Ultrasound/IVUS;  Surgeon: David Crafts, MD;  Location: [facility] INVASIVE CV LAB;  Service: Cardiovascular;  Laterality: N/A;   HYDROCELE EXCISION Right 05/15/2003   IR ANGIOGRAM VISCERAL SELECTIVE  02/16/2019   IR ANGIOGRAM VISCERAL SELECTIVE  02/16/2019   IR ANGIOGRAM VISCERAL SELECTIVE  02/16/2019   IR US GUIDE VASC ACCESS RIGHT  02/16/2019   LEFT HEART CATH AND CORONARY ANGIOGRAPHY N/A 07/28/2021   Procedure: LEFT HEART CATH AND CORONARY ANGIOGRAPHY;  Surgeon: David Crafts, MD;  Location: [facility] INVASIVE CV LAB;  Service: Cardiovascular;  Laterality: N/A;   LEFT HEART CATH AND CORONARY ANGIOGRAPHY N/A 08/25/2021   Procedure: LEFT HEART CATH AND CORONARY ANGIOGRAPHY;  Surgeon: David Crafts, MD;  Location: [facility] INVASIVE CV LAB;  Service: Cardiovascular;  Laterality: N/A;   PERIPHERAL INTRAVASCULAR LITHOTRIPSY  08/25/2021   Procedure: INTRAVASCULAR LITHOTRIPSY;  Surgeon: David Crafts, MD;  Location: [facility] INVASIVE CV LAB;  Service: Cardiovascular;;   ROTATOR CUFF REPAIR     SPERMATOCELECTOMY Right 05/15/2003   TOTAL KNEE ARTHROPLASTY Left 07/11/2023   Procedure: TOTAL KNEE ARTHROPLASTY;  Surgeon: David Gross, MD;  Location: [facility];  Service: Orthopedics;  Laterality: Left;   Family History  Problem Relation Age of Onset   Cancer Brother    Outpatient Medications Prior to Visit  Medication Sig Dispense Refill   Ascorbic Acid (VITAMIN C) 1000 MG tablet Take 1,000 mg by mouth 2 (two) times a week.     Cyanocobalamin (B-12 PO) Take 1 tablet by mouth daily.      docusate sodium (COLACE) 250 MG capsule Take 250 mg by mouth daily.     hydrochlorothiazide (HYDRODIURIL) 25 MG tablet TAKE 1 TABLET BY MOUTH DAILY 100 tablet 2   latanoprost (XALATAN) 0.005 % ophthalmic solution Place 1 drop into both eyes at bedtime. 7.5 mL 1   MAGNESIUM PO Take 400 mg by mouth at bedtime.     methocarbamol (ROBAXIN) 500 MG tablet Take 1 tablet (500 mg total) by mouth every 6 (six) hours as needed for muscle spasms. 40 tablet 0   nitroGLYCERIN (NITROSTAT) 0.4 MG SL tablet Place 1 tablet (0.4 mg total) under the tongue every 5 (five) minutes as needed for chest pain. 25 tablet 3   ondansetron (ZOFRAN) 4 MG tablet Take 1 tablet (4 mg total) by mouth every 6 (six) hours as needed for nausea. (Patient taking differently: Take 4 mg by mouth as needed for nausea or vomiting.) 20 tablet 0   pantoprazole (PROTONIX) 40 MG tablet Take 1 tablet by mouth once daily 90 tablet 0   Polyvinyl Alcohol-Povidone (REFRESH OP) Place 1 drop into the left eye 2 (two) times daily as needed (dryness).     promethazine (PHENERGAN) 25 MG tablet Take 1 tablet (25 mg total) by mouth every 8 (eight) hours as needed for nausea or vomiting. 20 tablet 0   psyllium (METAMUCIL) 58.6 % powder Take 1  packet by mouth daily.     rosuvastatin (CRESTOR) 20 MG tablet Take 1 tablet by mouth once daily 90 tablet 3   tadalafil (CIALIS) 20 MG tablet Take 1 tablet (20 mg total) by mouth daily as needed for erectile dysfunction. 30 tablet 2   tamsulosin (FLOMAX) 0.4 MG CAPS capsule TAKE 1 CAPSULE BY MOUTH DAILY 100 capsule 2   valsartan (DIOVAN) 320 MG tablet Take 1 tablet by mouth once daily 90 tablet 0   zinc gluconate 50 MG tablet Take 50 mg by mouth daily.     traZODone (DESYREL) 50 MG tablet Take 0.5-1 tablets (25-50 mg total) by mouth at bedtime as needed for sleep. 30 tablet 3   No facility-administered medications prior to visit.   No Known Allergies Objective:   Today's Vitals   09/05/23 1436  BP: 120/70   Pulse: 71  Temp: 98.4 F (36.9 C)  TempSrc: Temporal  SpO2: 96%  Weight: 159 lb 9.6 oz (72.4 kg)  Height: 5\' 4"  (1.626 m)   Body mass index is 27.4 kg/m.   General: Well developed, well nourished. No acute distress. Rectal: No masses. Prostate feels normal size. Stool is heme -. Psych: Alert and oriented. Normal mood and affect.  There are no preventive care reminders to display for this patient.    Assessment & Plan:   Problem List Items Addressed This Visit   None Visit Diagnoses     Abdominal cramping    -  Primary   CT scan is pending. Patient is on pantoprazole. Recommend he follow-up with GIU concern possible EGD.   Melena       No blood noted today, but stool was scant on rectal exam. Cotninue Protonix 40 mg daily and follow-up with GI.       Return for Follow-up after testing/imaging.   Loyola Mast, MD

## 2023-09-12 ENCOUNTER — Telehealth: Payer: Self-pay | Admitting: Family Medicine

## 2023-09-12 NOTE — Telephone Encounter (Signed)
Pt is wanting a cb concerning his most recent CT ABDOMEN PELVIS W CONTRAST (Accession [id number]) (Order [id number]) results. Please advise pt at [phone]

## 2023-09-12 NOTE — Telephone Encounter (Signed)
Still don't have the report in chart yet.   Will check later today. Dm/cma

## 2023-09-13 NOTE — Telephone Encounter (Signed)
Spoke to Winn @ [facility] regarding results at [phone], was advised that the images haven't been read as of yet but they would place a message to have them done ASAP.  Dm/cma

## 2023-09-13 NOTE — Telephone Encounter (Signed)
Patient notified VIA.  Will advise as soon get them. Dm/cma

## 2023-09-13 NOTE — Telephone Encounter (Signed)
Pt checking status of this message.

## 2023-09-14 NOTE — Telephone Encounter (Signed)
Report is now in the chart.  Please review and advise.  Thanks. Dm/cma

## 2023-09-14 NOTE — Telephone Encounter (Signed)
Patient notified VIA phone. Dm/cma

## 2023-09-23 ENCOUNTER — Other Ambulatory Visit: Payer: Self-pay | Admitting: Interventional Cardiology

## 2023-10-11 ENCOUNTER — Ambulatory Visit: Payer: Medicare Other | Admitting: Adult Health

## 2023-10-17 DIAGNOSIS — K317 Polyp of stomach and duodenum: Secondary | ICD-10-CM | POA: Diagnosis not present

## 2023-10-17 DIAGNOSIS — R110 Nausea: Secondary | ICD-10-CM | POA: Diagnosis not present

## 2023-10-21 ENCOUNTER — Ambulatory Visit (INDEPENDENT_AMBULATORY_CARE_PROVIDER_SITE_OTHER): Payer: Medicare Other | Admitting: Internal Medicine

## 2023-10-21 ENCOUNTER — Encounter: Payer: Self-pay | Admitting: Internal Medicine

## 2023-10-21 VITALS — BP 100/74 | HR 74 | Temp 98.1°F | Ht 64.0 in | Wt 166.0 lb

## 2023-10-21 DIAGNOSIS — J0140 Acute pansinusitis, unspecified: Secondary | ICD-10-CM | POA: Diagnosis not present

## 2023-10-21 DIAGNOSIS — J40 Bronchitis, not specified as acute or chronic: Secondary | ICD-10-CM | POA: Diagnosis not present

## 2023-10-21 LAB — POC COVID19 BINAXNOW: SARS Coronavirus 2 Ag: NEGATIVE

## 2023-10-21 LAB — POCT INFLUENZA A/B
Influenza A, POC: NEGATIVE
Influenza B, POC: NEGATIVE

## 2023-10-21 LAB — POCT RAPID STREP A (OFFICE): Rapid Strep A Screen: NEGATIVE

## 2023-10-21 MED ORDER — CETIRIZINE HCL 10 MG PO TABS: 10.0000 mg | ORAL_TABLET | Freq: Every day | ORAL | 1 refills | Status: DC

## 2023-10-21 MED ORDER — AZITHROMYCIN 250 MG PO TABS: ORAL_TABLET | ORAL | 0 refills | Status: DC

## 2023-10-21 MED ORDER — HYDROCODONE BIT-HOMATROP MBR 5-1.5 MG/5ML PO SOLN: 5.0000 mL | Freq: Three times a day (TID) | ORAL | 0 refills | Status: DC | PRN

## 2023-10-21 NOTE — Patient Instructions (Addendum)
Rest, drink plenty of fluids.  Tylenol for fever, body aches.    Sore throat: Warm Salt water gargles  Throat lozenges  Chloraseptic throat spray   For nasal and sinus congestion: Use over-the-counter Flonase: 2 nasal sprays on each side of the nose in the morning until you feel better  Patients with high blood pressure can use Coricidin HBP for decongestant, it will not raise your blood pressure.   If you have been prescribed an antibiotic, take as prescribed.   Call if not gradually better over the next  10 days  Call anytime if the symptoms are severe, you have high fever, short of breath, chest pain

## 2023-10-21 NOTE — Progress Notes (Signed)
[facility] [address] [place] [address] Dept: [phone] Dept Fax: [phone]  Acute Care Office Visit  Subjective:   David Goodman 31-Jul-1941 10/21/2023  Chief Complaint  Patient presents with   Cough   Fever   Sore Throat    Started on Wednesday     HPI: Discussed the use of AI scribe software for clinical note transcription with the patient, who gave verbal consent to proceed.  History of Present Illness   The patient, with a history of bronchitis  presents with a three-day history of respiratory symptoms. He reports a fever of 102 degrees Fahrenheit, which was managed with Advil. He describes a dry cough, which is intermittent and more pronounced at night. He also reports a sensation of head congestion and a sore throat, but denies any earache. He has not noticed any chest pain or shortness of breath.. He also mentions a history of bronchitis and expresses concern about wheezing, which he has noticed recently. Has taken hycodan cough syrup in past which helps with bronchitis.       The following portions of the patient's history were reviewed and updated as appropriate: past medical history, past surgical history, family history, social history, allergies, medications, and problem list.   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  09/09/2022   Gait instability 09/09/2022   Acute right-sided low back pain without sciatica 09/03/2022   Nonallergic rhinitis 08/04/2022   History of colonic polyps 07/22/2022   Gastro-esophageal reflux disease with esophagitis 07/22/2022   Gastric polyp 07/22/2022   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   GIB (gastrointestinal bleeding) 04/11/2022   OSA on CPAP 12/13/2021   Vivid dream 09/21/2021   Excessive daytime sleepiness 09/21/2021   Snoring 09/21/2021   Coronary artery disease involving native coronary artery of native heart 09/21/2021   SOBOE (shortness of breath on exertion) 09/21/2021   HFrEF (heart failure with reduced ejection fraction) (HCC)    Bell's palsy, left 06/05/2021   Cerebrovascular disease 06/05/2021   Hiatal hernia 05/29/2021   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   Erectile dysfunction due to arterial insufficiency 02/27/2021   Hemorrhoids 07/17/2020   Syncope 11/15/2018   Diverticulitis 05/25/2018   Osteoarthritis of right knee 12/13/2017   OA (osteoarthritis) of knee 12/13/2017   Testosterone deficiency 03/22/2017   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/27/2016   BPH (benign prostatic hyperplasia) 06/18/2013   Viral URI with cough 09/03/2008   HLD (hyperlipidemia) 07/13/2007   Essential hypertension 07/13/2007   Allergic rhinitis 07/13/2007   Peptic ulcer disease 07/13/2007   History of nephrolithiasis 07/13/2007   Past Medical History:  Diagnosis Date   ALLERGIC RHINITIS 07/13/2007   Aorta dilated on Echo; Normal sized Aorta on CT    Chest CT 1/23: No thoracic aortic aneurysm; coronary calcifications; aortic atherosclerosis; improved aeration of lungs with persistent minimal reticular opacities   CAD (coronary artery disease)  CHF (congestive heart failure) (HCC)    Diverticulitis    GERD (gastroesophageal reflux disease)    GI bleed    Heart failure with mid-range ejection fraction  (HFmEF) (HCC)    HYPERLIPIDEMIA 07/13/2007   HYPERTENSION 07/13/2007   Impaired glucose tolerance 09/27/2016   Iron deficiency anemia due to chronic blood loss 02/27/2021   NEPHROLITHIASIS, 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   NSVT (nonsustained ventricular tachycardia) (HCC)    PEPTIC ULCER DISEASE 07/13/2007   PVC's (premature ventricular contractions)    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   Past Surgical History:  Procedure Laterality Date   BROW LIFT Bilateral 11/10/2020   Procedure: BLEPHAROPLASTY;  Surgeon: Allena Napoleon, MD;  Location: [facility];  Service: Plastics;  Laterality: Bilateral;  1 hour   COLONOSCOPY N/A 04/12/2022   Procedure: COLONOSCOPY;  Surgeon: Willis Modena, MD;  Location: [facility];  Service: Gastroenterology;  Laterality: N/A;   COLONOSCOPY WITH PROPOFOL N/A 02/17/2019   Procedure: COLONOSCOPY WITH PROPOFOL;  Surgeon: [name], MD;  Location: [facility];  Service: Gastroenterology;  Laterality: N/A;   CORONARY STENT INTERVENTION N/A 08/25/2021   Procedure: CORONARY STENT INTERVENTION;  Surgeon: Corky Crafts, MD;  Location: [facility] INVASIVE CV LAB;  Service: Cardiovascular;  Laterality: N/A;   CORONARY ULTRASOUND/IVUS N/A 08/25/2021   Procedure: Intravascular Ultrasound/IVUS;  Surgeon: Corky Crafts, MD;  Location: [facility] INVASIVE CV LAB;  Service: Cardiovascular;  Laterality: N/A;   HYDROCELE EXCISION Right 05/15/2003   IR ANGIOGRAM VISCERAL SELECTIVE  02/16/2019   IR ANGIOGRAM VISCERAL SELECTIVE  02/16/2019   IR ANGIOGRAM VISCERAL SELECTIVE  02/16/2019   IR US GUIDE VASC ACCESS RIGHT  02/16/2019   LEFT HEART CATH AND CORONARY ANGIOGRAPHY N/A 07/28/2021   Procedure: LEFT HEART CATH AND CORONARY ANGIOGRAPHY;  Surgeon: Corky Crafts, MD;  Location: [facility] INVASIVE CV LAB;  Service: Cardiovascular;  Laterality: N/A;   LEFT HEART CATH AND CORONARY ANGIOGRAPHY N/A 08/25/2021   Procedure: LEFT HEART CATH AND CORONARY ANGIOGRAPHY;  Surgeon:  Corky Crafts, MD;  Location: [facility] INVASIVE CV LAB;  Service: Cardiovascular;  Laterality: N/A;   PERIPHERAL INTRAVASCULAR LITHOTRIPSY  08/25/2021   Procedure: INTRAVASCULAR LITHOTRIPSY;  Surgeon: Corky Crafts, MD;  Location: [facility] INVASIVE CV LAB;  Service: Cardiovascular;;   ROTATOR CUFF REPAIR     SPERMATOCELECTOMY Right 05/15/2003   TOTAL KNEE ARTHROPLASTY Left 07/11/2023   Procedure: TOTAL KNEE ARTHROPLASTY;  Surgeon: Ollen Gross, MD;  Location: [facility];  Service: Orthopedics;  Laterality: Left;   Family History  Problem Relation Age of Onset   Cancer Brother     Current Outpatient Medications:    Ascorbic Acid (VITAMIN C) 1000 MG tablet, Take 1,000 mg by mouth 2 (two) times a week., Disp: , Rfl:    azithromycin (ZITHROMAX) 250 MG tablet, Take 2 tablets on day 1, then 1 tablet daily on days 2 through 5, Disp: 6 tablet, Rfl: 0   cetirizine (ZYRTEC) 10 MG tablet, Take 1 tablet (10 mg total) by mouth daily., Disp: 90 tablet, Rfl: 1   Cyanocobalamin (B-12 PO), Take 1 tablet by mouth daily., Disp: , Rfl:    docusate sodium (COLACE) 250 MG capsule, Take 250 mg by mouth daily., Disp: , Rfl:    hydrochlorothiazide (HYDRODIURIL) 25 MG tablet, TAKE 1 TABLET BY MOUTH DAILY, Disp: 100 tablet, Rfl: 2   HYDROcodone bit-homatropine (HYCODAN) 5-1.5 MG/5ML syrup, Take 5 mLs by mouth every 8 (eight) hours as needed for cough., Disp: 120 mL, Rfl: 0   latanoprost (XALATAN)  0.005 % ophthalmic solution, Place 1 drop into both eyes at bedtime., Disp: 7.5 mL, Rfl: 1   MAGNESIUM PO, Take 400 mg by mouth at bedtime., Disp: , Rfl:    methocarbamol (ROBAXIN) 500 MG tablet, Take 1 tablet (500 mg total) by mouth every 6 (six) hours as needed for muscle spasms., Disp: 40 tablet, Rfl: 0   nitroGLYCERIN (NITROSTAT) 0.4 MG SL tablet, Place 1 tablet (0.4 mg total) under the tongue every 5 (five) minutes as needed for chest pain., Disp: 25 tablet, Rfl: 3   ondansetron (ZOFRAN) 4 MG tablet, Take 1 tablet (4 mg  total) by mouth every 6 (six) hours as needed for nausea. (Patient taking differently: Take 4 mg by mouth as needed for nausea or vomiting.), Disp: 20 tablet, Rfl: 0   pantoprazole (PROTONIX) 40 MG tablet, Take 1 tablet by mouth once daily, Disp: 90 tablet, Rfl: 0   Polyvinyl Alcohol-Povidone (REFRESH OP), Place 1 drop into the left eye 2 (two) times daily as needed (dryness)., Disp: , Rfl:    rosuvastatin (CRESTOR) 20 MG tablet, Take 1 tablet by mouth once daily, Disp: 90 tablet, Rfl: 3   tadalafil (CIALIS) 20 MG tablet, Take 1 tablet (20 mg total) by mouth daily as needed for erectile dysfunction., Disp: 30 tablet, Rfl: 2   tamsulosin (FLOMAX) 0.4 MG CAPS capsule, TAKE 1 CAPSULE BY MOUTH DAILY, Disp: 100 capsule, Rfl: 2   valsartan (DIOVAN) 320 MG tablet, Take 1 tablet by mouth once daily, Disp: 90 tablet, Rfl: 1   zinc gluconate 50 MG tablet, Take 50 mg by mouth daily., Disp: , Rfl:    psyllium (METAMUCIL) 58.6 % powder, Take 1 packet by mouth daily., Disp: , Rfl:  No Known Allergies   ROS: A complete ROS was performed with pertinent positives/negatives noted in the HPI. The remainder of the ROS are negative.    Objective:   Today's Vitals   10/21/23 1031  BP: 100/74  Pulse: 74  Temp: 98.1 F (36.7 C)  TempSrc: Temporal  SpO2: 97%  Weight: 166 lb (75.3 kg)  Height: 5\' 4"  (1.626 m)    GENERAL: Well-appearing, in NAD. Well nourished.  SKIN: Pink, warm and dry. No rash, lesion, ulceration, or ecchymoses.  HEENT:    HEAD: Normocephalic, non-traumatic.  EYES: Conjunctive pink without exudate. PERRL, EOMI.  EARS: External ear w/o redness, swelling, masses, or lesions. EAC clear. TM's intact, translucent w/o bulging, appropriate landmarks visualized.  NOSE: Septum midline w/o deformity. Nares patent, mucosa pink and inflamed with drainage. sinus tenderness.  THROAT: Uvula midline. Oropharynx erythematous. Tonsils absent. Mucus membranes pink and moist.  NECK: Trachea midline. Full ROM  w/o pain or tenderness. No lymphadenopathy.  RESPIRATORY: Chest wall symmetrical. Respirations even and non-labored. Breath sounds clear to auscultation bilaterally. (+)dry, hacky cough CARDIAC: S1, S2 present, regular rate and rhythm. Peripheral pulses 2+ bilaterally.  EXTREMITIES: Without clubbing, cyanosis, or edema.  NEUROLOGIC:  Steady, even gait.  PSYCH/MENTAL STATUS: Alert, oriented x 3. Cooperative, appropriate mood and affect.    Results for orders placed or performed in visit on 10/21/23  POC COVID-19 BinaxNow  Result Value Ref Range   SARS Coronavirus 2 Ag Negative Negative  POCT Influenza A/B  Result Value Ref Range   Influenza A, POC Negative Negative   Influenza B, POC Negative Negative  POCT rapid strep A  Result Value Ref Range   Rapid Strep A Screen Negative Negative      Assessment & Plan:  Assessment and Plan  Bronchitis -     POC COVID-19 BinaxNow -     POCT Influenza A/B -     POCT rapid strep A -     HYDROcodone Bit-Homatrop MBr; Take 5 mLs by mouth every 8 (eight) hours as needed for cough.  Dispense: 120 mL; Refill: 0  Acute non-recurrent pansinusitis -     Azithromycin; Take 2 tablets on day 1, then 1 tablet daily on days 2 through 5  Dispense: 6 tablet; Refill: 0 -     Cetirizine HCl; Take 1 tablet (10 mg total) by mouth daily.  Dispense: 90 tablet; Refill: 1 - can also take Flonase nasal spray as needed for nasal congestion  - Sinus rinse for congestion   Meds ordered this encounter  Medications   HYDROcodone bit-homatropine (HYCODAN) 5-1.5 MG/5ML syrup    Sig: Take 5 mLs by mouth every 8 (eight) hours as needed for cough.    Dispense:  120 mL    Refill:  0    Order Specific Question:   Supervising Provider    Answer:   Garnette Gunner [[id number]]   azithromycin (ZITHROMAX) 250 MG tablet    Sig: Take 2 tablets on day 1, then 1 tablet daily on days 2 through 5    Dispense:  6 tablet    Refill:  0    Order Specific Question:   Supervising  Provider    Answer:   Garnette Gunner [[id number]]   cetirizine (ZYRTEC) 10 MG tablet    Sig: Take 1 tablet (10 mg total) by mouth daily.    Dispense:  90 tablet    Refill:  1    Order Specific Question:   Supervising Provider    Answer:   Garnette Gunner [[id number]]   Orders Placed This Encounter  Procedures   POC COVID-19 BinaxNow    Order Specific Question:   Previously tested for COVID-19    Answer:   Unknown    Order Specific Question:   Resident in a congregate (group) care setting    Answer:   Unknown    Order Specific Question:   Employed in healthcare setting    Answer:   Unknown   POCT Influenza A/B   POCT rapid strep A   Lab Orders         POC COVID-19 BinaxNow         POCT Influenza A/B         POCT rapid strep A     No images are attached to the encounter or orders placed in the encounter.  Return if symptoms worsen or fail to improve.   Salvatore Decent, FNP

## 2023-10-26 ENCOUNTER — Ambulatory Visit: Payer: Medicare Other | Admitting: Nurse Practitioner

## 2023-10-26 ENCOUNTER — Encounter: Payer: Self-pay | Admitting: Nurse Practitioner

## 2023-10-26 VITALS — BP 126/54 | HR 90 | Temp 98.6°F | Resp 18 | Wt 164.4 lb

## 2023-10-26 DIAGNOSIS — J209 Acute bronchitis, unspecified: Secondary | ICD-10-CM

## 2023-10-26 MED ORDER — BENZONATATE 100 MG PO CAPS: 100.0000 mg | ORAL_CAPSULE | Freq: Three times a day (TID) | ORAL | 0 refills | Status: DC | PRN

## 2023-10-26 MED ORDER — ALBUTEROL SULFATE HFA 108 (90 BASE) MCG/ACT IN AERS: 2.0000 | INHALATION_SPRAY | Freq: Four times a day (QID) | RESPIRATORY_TRACT | 0 refills | Status: DC | PRN

## 2023-10-26 MED ORDER — ALBUTEROL SULFATE (2.5 MG/3ML) 0.083% IN NEBU
2.5000 mg | INHALATION_SOLUTION | Freq: Once | RESPIRATORY_TRACT | Status: AC
  Administered 2023-10-26: 2.5 mg via RESPIRATORY_TRACT

## 2023-10-26 MED ORDER — DM-GUAIFENESIN ER 30-600 MG PO TB12
1.0000 | ORAL_TABLET | Freq: Two times a day (BID) | ORAL | Status: DC | PRN
Start: 2023-10-26 — End: 2023-12-13

## 2023-10-26 MED ORDER — METHYLPREDNISOLONE 4 MG PO TBPK: ORAL_TABLET | ORAL | 0 refills | Status: DC

## 2023-10-26 MED ORDER — METHYLPREDNISOLONE ACETATE 40 MG/ML IJ SUSP: 40.0000 mg | Freq: Once | INTRAMUSCULAR | Status: DC

## 2023-10-26 MED ORDER — METHYLPREDNISOLONE SODIUM SUCC 40 MG IJ SOLR
40.0000 mg | Freq: Once | INTRAMUSCULAR | Status: AC
  Administered 2023-10-26: 40 mg via INTRAMUSCULAR

## 2023-10-26 NOTE — Progress Notes (Signed)
Acute Office Visit  Subjective:    [name], male    DOB: October 02, 1941, 83 y.o.   MRN: [id number]  Chief Complaint  Patient presents with   OFFICE VISIT     PT C/O of ongoing cough and nasal congestion with pain in chest and abdominal due to persistent coughing.  PT was seen on 10/21/2023 for symptoms in which has not improved. PT took medication as prescribed    HPI Patient is in today for persistent cough, right side chest pain, and SOB despite completion on azithromycin and hycodan. Reports drowsiness and hallucination with hycodan. Denies any fever or chills or night sweats.  Outpatient Medications Prior to Visit  Medication Sig   Ascorbic Acid (VITAMIN C) 1000 MG tablet Take 1,000 mg by mouth 2 (two) times a week.   Cyanocobalamin (B-12 PO) Take 1 tablet by mouth daily.   docusate sodium (COLACE) 250 MG capsule Take 250 mg by mouth daily.   hydrochlorothiazide (HYDRODIURIL) 25 MG tablet TAKE 1 TABLET BY MOUTH DAILY   latanoprost (XALATAN) 0.005 % ophthalmic solution Place 1 drop into both eyes at bedtime.   MAGNESIUM PO Take 400 mg by mouth at bedtime.   methocarbamol (ROBAXIN) 500 MG tablet Take 1 tablet (500 mg total) by mouth every 6 (six) hours as needed for muscle spasms.   nitroGLYCERIN (NITROSTAT) 0.4 MG SL tablet Place 1 tablet (0.4 mg total) under the tongue every 5 (five) minutes as needed for chest pain.   ondansetron (ZOFRAN) 4 MG tablet Take 1 tablet (4 mg total) by mouth every 6 (six) hours as needed for nausea. (Patient taking differently: Take 4 mg by mouth as needed for nausea or vomiting.)   pantoprazole (PROTONIX) 40 MG tablet Take 1 tablet by mouth once daily   Polyvinyl Alcohol-Povidone (REFRESH OP) Place 1 drop into the left eye 2 (two) times daily as needed (dryness).   psyllium (METAMUCIL) 58.6 % powder Take 1 packet by mouth daily.   rosuvastatin (CRESTOR) 20 MG tablet Take 1 tablet by mouth once daily   tadalafil (CIALIS) 20 MG tablet Take  1 tablet (20 mg total) by mouth daily as needed for erectile dysfunction.   tamsulosin (FLOMAX) 0.4 MG CAPS capsule TAKE 1 CAPSULE BY MOUTH DAILY   traMADol (ULTRAM) 50 MG tablet Take 1 tablet by mouth every 6 (six) hours as needed.   valsartan (DIOVAN) 320 MG tablet Take 1 tablet by mouth once daily   zinc gluconate 50 MG tablet Take 50 mg by mouth daily.   [DISCONTINUED] azithromycin (ZITHROMAX) 250 MG tablet Take 2 tablets on day 1, then 1 tablet daily on days 2 through 5 (Patient not taking: Reported on 10/26/2023)   [DISCONTINUED] cetirizine (ZYRTEC) 10 MG tablet Take 1 tablet (10 mg total) by mouth daily. (Patient not taking: Reported on 10/26/2023)   [DISCONTINUED] HYDROcodone bit-homatropine (HYCODAN) 5-1.5 MG/5ML syrup Take 5 mLs by mouth every 8 (eight) hours as needed for cough. (Patient not taking: Reported on 10/26/2023)   No facility-administered medications prior to visit.    Reviewed past medical and social history.  Review of Systems Per HPI     Objective:    Physical Exam Vitals and nursing note reviewed.  Cardiovascular:     Rate and Rhythm: Normal rate and regular rhythm.     Pulses: Normal pulses.     Heart sounds: Normal heart sounds.  Pulmonary:     Effort: Pulmonary effort is normal.     Breath sounds:  Normal breath sounds.     Comments: Reports improved chest tightness post nebulizer treatement Neurological:     Mental Status: He is alert and oriented to person, place, and time.    BP (!) 126/54 (BP Location: Left Arm, Patient Position: Sitting, Cuff Size: Large)   Pulse 90   Temp 98.6 F (37 C) (Oral)   Resp 18   Wt 164 lb 6.4 oz (74.6 kg)   SpO2 98%   BMI 28.22 kg/m    No results found for any visits on 10/26/23.      Assessment & Plan:   Problem List Items Addressed This Visit   None Visit Diagnoses     Acute bronchitis, unspecified organism    -  Primary   Relevant Medications   albuterol (PROVENTIL) (2.5 MG/3ML) 0.083% nebulizer  solution 2.5 mg (Completed)   methylPREDNISolone (MEDROL DOSEPAK) 4 MG TBPK tablet   dextromethorphan-guaiFENesin (MUCINEX DM) 30-600 MG 12hr tablet   benzonatate (TESSALON) 100 MG capsule   methylPREDNISolone sodium succinate (SOLU-MEDROL) 40 mg/mL injection 40 mg (Completed)   albuterol (VENTOLIN HFA) 108 (90 Base) MCG/ACT inhaler   Other Relevant Orders   DG Chest 2 View        Meds ordered this encounter  Medications   albuterol (PROVENTIL) (2.5 MG/3ML) 0.083% nebulizer solution 2.5 mg   DISCONTD: methylPREDNISolone acetate (DEPO-MEDROL) injection 40 mg   methylPREDNISolone (MEDROL DOSEPAK) 4 MG TBPK tablet    Sig: Take as directed on package    Dispense:  21 tablet    Refill:  0    Order Specific Question:   Supervising Provider    Answer:   Mliss Sax [[id number]]   dextromethorphan-guaiFENesin (MUCINEX DM) 30-600 MG 12hr tablet    Sig: Take 1 tablet by mouth 2 (two) times daily as needed for cough.    Order Specific Question:   Supervising Provider    Answer:   Mliss Sax [[id number]]   benzonatate (TESSALON) 100 MG capsule    Sig: Take 1-2 capsules (100-200 mg total) by mouth 3 (three) times daily as needed.    Dispense:  21 capsule    Refill:  0    Order Specific Question:   Supervising Provider    Answer:   Nadene Rubins ALFRED [[id number]]   methylPREDNISolone sodium succinate (SOLU-MEDROL) 40 mg/mL injection 40 mg   albuterol (VENTOLIN HFA) 108 (90 Base) MCG/ACT inhaler    Sig: Inhale 2 puffs into the lungs every 6 (six) hours as needed.    Dispense:  8 g    Refill:  0    Order Specific Question:   Supervising Provider    Answer:   Nadene Rubins ALFRED [[id number]]  Alternate between benzonatate and robitussin Dm OVER THE COUNTER every 4hrs as needed for cough. Maintain adequate oral hydration Start oral prednisone tomorrow Go to ED if symptoms worsen over the holiday weekend  Return if symptoms worsen or fail to improve.  Alysia Penna, NP

## 2023-10-26 NOTE — Patient Instructions (Addendum)
Go to [address] for CXR Alternate between benzonatate and robitussin Dm OVER THE COUNTER every 4hrs as needed for cough. Maintain adequate oral hydration Start oral prednisone tomorrow Go to ED if symptoms worsen over the holiday weekend

## 2023-10-31 ENCOUNTER — Ambulatory Visit
Admission: RE | Admit: 2023-10-31 | Discharge: 2023-10-31 | Disposition: A | Discharge status: Home / Self Care | Payer: Medicare Other | Source: Ambulatory Visit | Attending: Nurse Practitioner

## 2023-10-31 DIAGNOSIS — J209 Acute bronchitis, unspecified: Secondary | ICD-10-CM

## 2023-10-31 DIAGNOSIS — R059 Cough, unspecified: Secondary | ICD-10-CM | POA: Diagnosis not present

## 2023-10-31 DIAGNOSIS — I517 Cardiomegaly: Secondary | ICD-10-CM | POA: Diagnosis not present

## 2023-10-31 DIAGNOSIS — R0602 Shortness of breath: Secondary | ICD-10-CM | POA: Diagnosis not present

## 2023-11-01 ENCOUNTER — Telehealth: Payer: Self-pay | Admitting: Interventional Cardiology

## 2023-11-01 ENCOUNTER — Other Ambulatory Visit: Payer: Self-pay

## 2023-11-01 MED ORDER — PANTOPRAZOLE SODIUM 40 MG PO TBEC: 40.0000 mg | DELAYED_RELEASE_TABLET | Freq: Every day | ORAL | 2 refills | Status: DC

## 2023-11-01 NOTE — Telephone Encounter (Signed)
*  STAT* If patient is at the pharmacy, call can be transferred to refill team.   1. Which medications need to be refilled? (please list name of each medication and dose if known)  pantoprazole (PROTONIX) 40 MG tablet    2. Would you like to learn more about the convenience, safety, & potential cost savings by using the [facility]?       3. Are you open to using the [facility] (Type [facility].   ).   4. Which pharmacy/location (including street and city if local pharmacy) is medication to be sent to? [id number] - [place], [place] - [address]    5. Do they need a 30 day or 90 day supply?   90 day

## 2023-11-07 ENCOUNTER — Telehealth: Payer: Self-pay | Admitting: Family Medicine

## 2023-11-07 ENCOUNTER — Encounter: Payer: Self-pay | Admitting: Family Medicine

## 2023-11-07 ENCOUNTER — Other Ambulatory Visit: Payer: Self-pay | Admitting: Nurse Practitioner

## 2023-11-07 ENCOUNTER — Ambulatory Visit (INDEPENDENT_AMBULATORY_CARE_PROVIDER_SITE_OTHER): Payer: Medicare Other | Admitting: Family Medicine

## 2023-11-07 VITALS — BP 128/68 | HR 72 | Temp 97.0°F | Wt 166.4 lb

## 2023-11-07 DIAGNOSIS — R051 Acute cough: Secondary | ICD-10-CM

## 2023-11-07 DIAGNOSIS — I5020 Unspecified systolic (congestive) heart failure: Secondary | ICD-10-CM | POA: Diagnosis not present

## 2023-11-07 DIAGNOSIS — R052 Subacute cough: Secondary | ICD-10-CM

## 2023-11-07 DIAGNOSIS — I517 Cardiomegaly: Secondary | ICD-10-CM

## 2023-11-07 DIAGNOSIS — I519 Heart disease, unspecified: Secondary | ICD-10-CM

## 2023-11-07 MED ORDER — HYDROCODONE BIT-HOMATROP MBR 5-1.5 MG/5ML PO SOLN: 5.0000 mL | Freq: Three times a day (TID) | ORAL | 0 refills | Status: DC | PRN

## 2023-11-07 MED ORDER — FUROSEMIDE 20 MG PO TABS: 20.0000 mg | ORAL_TABLET | Freq: Every day | ORAL | 0 refills | Status: DC

## 2023-11-07 NOTE — Assessment & Plan Note (Signed)
Persistent cough unresponsive to previous treatments, including albuterol inhaler, prednisone, benzonatate (Tessalon Perles), and guaifenesin (Mucinex). Cough is causing chest pain and disrupting sleep.  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 Viral bronchitis.  Plan: Address underlying heart failure as above. Hycodan as prescribed

## 2023-11-07 NOTE — Patient Instructions (Signed)
-   Begin taking Lasix 20 mg once daily as prescribed. - Follow up with the cardiologist as soon as possible; an urgent referral has been placed. - Go to the emergency department if your symptoms become severe, including chest pain or worsening shortness of breath.

## 2023-11-07 NOTE — Progress Notes (Signed)
Assessment/Plan:   Problem List Items Addressed This Visit       Cardiovascular and Mediastinum   HFrEF (heart failure with reduced ejection fraction) (HCC)    Concern for exacerbation with worsening symptoms including persistent cough, new onset of shortness of breath,Chest X-ray reveals mild cardiomegaly; ECG shows sinus rhythm with frequent multiform ectopic ventricular beats and nonspecific QRS widening.  Plan: Initiate furosemide 20?mg orally once daily to reduce fluid overload. CBC, comprehensive metabolic panel, B-type natriuretic peptide, and troponin Urgent referral to cardiology for further evaluation and management. Advise patient to go to the emergency department if symptoms become severe, including chest pain or worsening shortness of breath.      Relevant Medications   furosemide (LASIX) 20 MG tablet   Other Relevant Orders   Ambulatory referral to Cardiology   CBC w/Diff   Comp Met (CMET)   Pro b natriuretic peptide   Troponin I   EKG 12-Lead (Completed)     Other   Subacute cough - Primary    Persistent cough unresponsive to previous treatments, including albuterol inhaler, prednisone, benzonatate (Tessalon Perles), and guaifenesin (Mucinex). Cough is causing chest pain and disrupting sleep.  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 Viral bronchitis.  Plan: Address underlying heart failure as above. Hycodan as prescribed      Relevant Medications   HYDROcodone bit-homatropine (HYCODAN) 5-1.5 MG/5ML syrup   Other Relevant Orders   CBC w/Diff   Comp Met (CMET)   Pro b natriuretic peptide   Troponin I   EKG 12-Lead (Completed)    Medications Discontinued During This Encounter  Medication Reason   traMADol (ULTRAM) 50 MG tablet     Return if symptoms worsen or fail to improve.    Subjective:   Encounter date: 11/07/2023  David Goodman is a 82 y.o. male who has HLD (hyperlipidemia); Essential hypertension;  Viral URI with cough; Allergic rhinitis; Peptic ulcer disease; History of nephrolithiasis; BPH (benign prostatic hyperplasia); Left ventricular dysfunction; Testosterone deficiency; Diverticulitis; Syncope; Hemorrhoids; Subacute cough; Osteoarthritis of right knee; OA (osteoarthritis) of knee; Open-angle glaucoma; Sensorineural hearing loss (SNHL) of both ears; Erectile dysfunction due to arterial insufficiency; Hiatal hernia; Bell's palsy, left; Cerebrovascular disease; HFrEF (heart failure with reduced ejection fraction) (HCC); Vivid dream; Excessive daytime sleepiness; Snoring; Coronary artery disease involving native coronary artery of native heart; SOBOE (shortness of breath on exertion); OSA on CPAP; GIB (gastrointestinal bleeding); History of colonic polyps; Gastro-esophageal reflux disease with esophagitis; Gastric polyp; Diverticular disease of colon; Nonallergic rhinitis; Acute right-sided low back pain without sciatica; Spondylosis of lumbar spine; 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   He  has a past medical history of ALLERGIC RHINITIS (07/13/2007), Aorta dilated on Echo; Normal sized Aorta on CT, CAD (coronary artery disease), CHF (congestive heart failure) (HCC), Diverticulitis, GERD (gastroesophageal reflux disease), GI bleed, 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), HYPERLIPIDEMIA (07/13/2007), HYPERTENSION (07/13/2007), Impaired glucose tolerance (09/27/2016), Iron deficiency anemia due to chronic blood loss (02/27/2021), NEPHROLITHIASIS, HX OF (07/13/2007), NSVT (nonsustained  ventricular tachycardia) (HCC), PEPTIC ULCER DISEASE (07/13/2007), PVC's (premature ventricular contractions), and 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 Worst at night.  Tx with mucinex dm with no help. Had chest xray Per [facility] "No pneumonia/Mildly enlarged heart per CXR. This was not seen on previous C scan. F/up with cardiology.) .   HPI:   ROS  Past Surgical History:  Procedure Laterality Date   BROW LIFT Bilateral 11/10/2020   Procedure: BLEPHAROPLASTY;  Surgeon: Allena Napoleon, MD;  Location: [facility];  Service: Plastics;  Laterality: Bilateral;  1 hour   COLONOSCOPY N/A 04/12/2022   Procedure: COLONOSCOPY;  Surgeon: Willis Modena, MD;  Location: [facility];  Service: Gastroenterology;  Laterality: N/A;   COLONOSCOPY WITH PROPOFOL N/A 02/17/2019   Procedure: COLONOSCOPY WITH PROPOFOL;  Surgeon: [name], MD;  Location: [facility];  Service: Gastroenterology;  Laterality: N/A;   CORONARY STENT INTERVENTION N/A 08/25/2021   Procedure: CORONARY STENT INTERVENTION;  Surgeon: Corky Crafts, MD;  Location: [facility] INVASIVE CV LAB;  Service: Cardiovascular;  Laterality: N/A;   CORONARY ULTRASOUND/IVUS N/A 08/25/2021   Procedure: Intravascular Ultrasound/IVUS;  Surgeon: Corky Crafts, MD;  Location: [facility] INVASIVE CV LAB;  Service: Cardiovascular;  Laterality: N/A;   HYDROCELE EXCISION Right 05/15/2003   IR ANGIOGRAM VISCERAL SELECTIVE  02/16/2019   IR ANGIOGRAM VISCERAL SELECTIVE  02/16/2019   IR ANGIOGRAM VISCERAL SELECTIVE  02/16/2019   IR US GUIDE VASC ACCESS RIGHT  02/16/2019   LEFT HEART CATH AND CORONARY ANGIOGRAPHY N/A 07/28/2021   Procedure: LEFT HEART CATH AND CORONARY ANGIOGRAPHY;  Surgeon: Corky Crafts, MD;  Location: [facility] INVASIVE CV LAB;  Service: Cardiovascular;  Laterality: N/A;   LEFT HEART CATH AND CORONARY ANGIOGRAPHY N/A 08/25/2021    Procedure: LEFT HEART CATH AND CORONARY ANGIOGRAPHY;  Surgeon: Corky Crafts, MD;  Location: [facility] INVASIVE CV LAB;  Service: Cardiovascular;  Laterality: N/A;   PERIPHERAL INTRAVASCULAR LITHOTRIPSY  08/25/2021   Procedure: INTRAVASCULAR LITHOTRIPSY;  Surgeon: Corky Crafts, MD;  Location: [facility] INVASIVE CV LAB;  Service: Cardiovascular;;   ROTATOR CUFF REPAIR     SPERMATOCELECTOMY Right 05/15/2003   TOTAL KNEE ARTHROPLASTY Left 07/11/2023   Procedure: TOTAL KNEE ARTHROPLASTY;  Surgeon: Ollen Gross, MD;  Location: [facility];  Service: Orthopedics;  Laterality: Left;    Outpatient Medications Prior to Visit  Medication Sig Dispense Refill   albuterol (VENTOLIN HFA) 108 (90 Base) MCG/ACT inhaler Inhale 2 puffs into the lungs every 6 (six) hours as needed. 8 g 0   Ascorbic Acid (VITAMIN C) 1000 MG tablet Take 1,000 mg by mouth 2 (two) times a week.     benzonatate (TESSALON) 100 MG capsule Take 1-2 capsules (100-200 mg total) by mouth 3 (three) times daily as needed. 21 capsule 0   Cyanocobalamin (B-12 PO) Take 1 tablet by mouth daily.     dextromethorphan-guaiFENesin (MUCINEX DM) 30-600 MG 12hr tablet Take 1 tablet by mouth 2 (two) times daily as needed for cough.     docusate sodium (COLACE) 250 MG capsule Take 250 mg by mouth daily.     hydrochlorothiazide (HYDRODIURIL) 25 MG tablet TAKE 1 TABLET BY MOUTH DAILY 100 tablet 2   latanoprost (XALATAN) 0.005 % ophthalmic solution Place 1 drop into both eyes at bedtime. 7.5 mL 1   MAGNESIUM PO Take 400 mg by mouth at bedtime.     methocarbamol (ROBAXIN) 500 MG tablet Take 1 tablet (500 mg total) by mouth every 6 (six) hours as needed for muscle spasms. 40 tablet 0   methylPREDNISolone (MEDROL DOSEPAK) 4 MG TBPK tablet Take as directed on package 21  tablet 0   nitroGLYCERIN (NITROSTAT) 0.4 MG SL tablet Place 1 tablet (0.4 mg total) under the tongue every 5 (five) minutes as needed for chest pain. 25 tablet 3   ondansetron (ZOFRAN) 4 MG tablet  Take 1 tablet (4 mg total) by mouth every 6 (six) hours as needed for nausea. (Patient taking differently: Take 4 mg by mouth as needed for nausea or vomiting.) 20 tablet 0   pantoprazole (PROTONIX) 40 MG tablet Take 1 tablet (40 mg total) by mouth daily. 90 tablet 2   Polyvinyl Alcohol-Povidone (REFRESH OP) Place 1 drop into the left eye 2 (two) times daily as needed (dryness).     psyllium (METAMUCIL) 58.6 % powder Take 1 packet by mouth daily.     rosuvastatin (CRESTOR) 20 MG tablet Take 1 tablet by mouth once daily 90 tablet 3   tadalafil (CIALIS) 20 MG tablet Take 1 tablet (20 mg total) by mouth daily as needed for erectile dysfunction. 30 tablet 2   tamsulosin (FLOMAX) 0.4 MG CAPS capsule TAKE 1 CAPSULE BY MOUTH DAILY 100 capsule 2   valsartan (DIOVAN) 320 MG tablet Take 1 tablet by mouth once daily 90 tablet 1   zinc gluconate 50 MG tablet Take 50 mg by mouth daily.     traMADol (ULTRAM) 50 MG tablet Take 1 tablet by mouth every 6 (six) hours as needed.     No facility-administered medications prior to visit.    Family History  Problem Relation Age of Onset   Cancer Brother     Social History   Socioeconomic History   Marital status: Married    Spouse name: Not on file   Number of children: Not on file   Years of education: Not on file   Highest education level: 12th grade  Occupational History   Not on file  Tobacco Use   Smoking status: Never   Smokeless tobacco: Never  Vaping Use   Vaping status: Never Used  Substance and Sexual Activity   Alcohol use: Yes    Comment: couple times a week - wine   Drug use: No   Sexual activity: Not Currently  Other Topics Concern   Not on file  Social History Narrative   Lives alone   Right handed   Caffeine: 1 cup of coffee a day   Social Determinants of Health   Financial Resource Strain: Low Risk  (06/02/2023)   Overall Financial Resource Strain (CARDIA)    Difficulty of Paying Living Expenses: Not hard at all  Food  Insecurity: No Food Insecurity (07/21/2023)   Hunger Vital Sign    Worried About Running Out of Food in the Last Year: Never true    Ran Out of Food in the Last Year: Never true  Transportation Needs: No Transportation Needs (07/21/2023)   PRAPARE - Administrator, Civil Service (Medical): No    Lack of Transportation (Non-Medical): No  Physical Activity: Sufficiently Active (06/02/2023)   Exercise Vital Sign    Days of Exercise per Week: 3 days    Minutes of Exercise per Session: 50 min  Stress: No Stress Concern Present (06/02/2023)   [employer] of Occupational Health - Occupational Stress Questionnaire    Feeling of Stress : Not at all  Social Connections: Moderately Integrated (06/02/2023)   Social Connection and Isolation Panel [NHANES]    Frequency of Communication with Friends and Family: More than three times a week    Frequency of Social Gatherings with Friends and  Family: Once a week    Attends Religious Services: More than 4 times per year    Active Member of Clubs or Organizations: No    Attends Banker Meetings: Not on file    Marital Status: Married  Intimate Partner Violence: Not At Risk (07/15/2023)   Humiliation, Afraid, Rape, and Kick questionnaire    Fear of Current or Ex-Partner: No    Emotionally Abused: No    Physically Abused: No    Sexually Abused: No                                                                                                  Objective:  Physical Exam: BP 128/68 (BP Location: Left Arm, Patient Position: Sitting, Cuff Size: Large)   Pulse 72   Temp (!) 97 F (36.1 C) (Temporal)   Wt 166 lb 6.4 oz (75.5 kg)   SpO2 97%   BMI 28.56 kg/m   Wt Readings from Last 3 Encounters:  11/07/23 166 lb 6.4 oz (75.5 kg)  10/26/23 164 lb 6.4 oz (74.6 kg)  10/21/23 166 lb (75.3 kg)     Physical Exam  DG Chest 2 View  Result Date: 11/07/2023 CLINICAL DATA:  Cough and shortness of breath for 1 week. EXAM: CHEST - 2  VIEW COMPARISON:  Chest radiographs 09/05/2022, 05/12/2022; CT chest 07/14/2023 FINDINGS: Cardiac silhouette is again mildly enlarged. Mediastinal contours within normal limits. Mild bilateral lower lung interstitial thickening is unchanged from multiple prior radiographs and CT, chronic scarring. No pleural effusion or pneumothorax. Postsurgical changes of prior distal left clavicle resection unchanged. Mild dextrocurvature of the midthoracic spine. Mild-to-moderate multilevel degenerative disc changes. IMPRESSION: 1. No acute cardiopulmonary process. 2. Stable mild cardiomegaly. Electronically Signed   By: Neita Garnet M.D.   On: 11/07/2023 08:17   CT ABDOMEN PELVIS W CONTRAST  Result Date: 09/13/2023 CLINICAL DATA:  Abdominal pain, acute, nonlocalized. History of diverticulitis and peptic ulcer disease. EXAM: CT ABDOMEN AND PELVIS WITH CONTRAST TECHNIQUE: Multidetector CT imaging of the abdomen and pelvis was performed using the standard protocol following bolus administration of intravenous contrast. RADIATION DOSE REDUCTION: This exam was performed according to the departmental dose-optimization program which includes automated exposure control, adjustment of the mA and/or kV according to patient size and/or use of iterative reconstruction technique. CONTRAST:  ISOVUE-300 IOPAMIDOL (ISOVUE-300) INJECTION 61% COMPARISON:  Abdominopelvic CT 07/14/2023 and CTA 04/11/2022. FINDINGS: Lower chest: Overall improved aeration of the lung bases with mild residual subpleural scarring. No significant pleural or pericardial effusion. Aortic and coronary artery atherosclerosis noted. Hepatobiliary: The liver is normal in density without suspicious focal abnormality. No evidence of gallstones, gallbladder wall thickening or biliary dilatation. Pancreas: Unremarkable. No pancreatic ductal dilatation or surrounding inflammatory changes. Spleen: Normal in size without focal abnormality. Adrenals/Urinary Tract: Both  adrenal glands appear normal. No evidence of urinary tract calculus, suspicious renal lesion or hydronephrosis. Stable tiny cyst in the upper pole of the right kidney for which no specific follow-up imaging is recommended. The bladder appears unremarkable for its degree of distention. Stomach/Bowel: Enteric contrast has passed into the  distal small bowel. The stomach appears unremarkable for its degree of distension. No evidence of bowel wall thickening, distention or surrounding inflammatory change. The appendix appears normal. There are extensive chronic diverticular changes throughout the colon without evidence of acute inflammation. Vascular/Lymphatic: There are no enlarged abdominal or pelvic lymph nodes. Aortic and branch vessel atherosclerosis without evidence of aneurysm or large vessel occlusion. The portal, superior mesenteric and splenic veins are patent. Retroaortic left renal vein noted. Reproductive: Stable mild enlargement of the prostate gland. Other: No evidence of abdominal wall mass or hernia. No ascites or pneumoperitoneum. Musculoskeletal: No acute or significant osseous findings. Multilevel lumbar spondylosis associated with a convex left scoliosis, chronic bilateral L5 pars defects and chronic left foraminal narrowing at L5-S1. The previously demonstrated soft tissue swelling and gas anteriorly in the proximal left thigh is no longer visualized. Unless specific follow-up recommendations are mentioned in the findings or impression sections, no imaging follow-up of any mentioned incidental findings is recommended. IMPRESSION: 1. No acute findings or explanation for the patient's symptoms. 2. Chronic extensive colonic diverticulosis without evidence of acute inflammation. The appendix appears normal. 3. Interval resolution of previously demonstrated soft tissue swelling and gas anteriorly in the proximal left thigh. 4.  Aortic Atherosclerosis (ICD10-I70.0). Electronically Signed   By: Carey Bullocks M.D.   On: 09/13/2023 12:11    Recent Results (from the past 2160 hour(s))  POCT Influenza A/B     Status: Normal   Collection Time: 10/21/23 10:56 AM  Result Value Ref Range   Influenza A, POC Negative Negative   Influenza B, POC Negative Negative  POCT rapid strep A     Status: Normal   Collection Time: 10/21/23 10:56 AM  Result Value Ref Range   Rapid Strep A Screen Negative Negative  POC COVID-19 BinaxNow     Status: Normal   Collection Time: 10/21/23 10:57 AM  Result Value Ref Range   SARS Coronavirus 2 Ag Negative Negative        Garner Nash, MD, MS

## 2023-11-07 NOTE — Telephone Encounter (Signed)
Pt was seen on 11/27 for cold symptoms. He had a chest xray done on 12/2. He is still having chest cough. Please give him a call for results.  Janziel [phone]

## 2023-11-07 NOTE — Assessment & Plan Note (Signed)
Concern for exacerbation with worsening symptoms including persistent cough, new onset of shortness of breath,Chest X-ray reveals mild cardiomegaly; ECG shows sinus rhythm with frequent multiform ectopic ventricular beats and nonspecific QRS widening.  Plan: Initiate furosemide 20?mg orally once daily to reduce fluid overload. CBC, comprehensive metabolic panel, B-type natriuretic peptide, and troponin Urgent referral to cardiology for further evaluation and management. Advise patient to go to the emergency department if symptoms become severe, including chest pain or worsening shortness of breath.

## 2023-11-08 ENCOUNTER — Telehealth: Payer: Self-pay

## 2023-11-08 ENCOUNTER — Telehealth: Payer: Self-pay | Admitting: Physician Assistant

## 2023-11-08 LAB — COMPREHENSIVE METABOLIC PANEL
ALT: 20 U/L (ref 0–53)
AST: 20 U/L (ref 0–37)
Albumin: 3.9 g/dL (ref 3.5–5.2)
Alkaline Phosphatase: 46 U/L (ref 39–117)
BUN: 20 mg/dL (ref 6–23)
CO2: 26 meq/L (ref 19–32)
Calcium: 8.5 mg/dL (ref 8.4–10.5)
Chloride: 100 meq/L (ref 96–112)
Creatinine, Ser: 0.81 mg/dL (ref 0.40–1.50)
GFR: 82.35 mL/min (ref >60.00)
Glucose, Bld: 88 mg/dL (ref 70–99)
Potassium: 3.3 meq/L — ABNORMAL LOW (ref 3.5–5.1)
Sodium: 135 meq/L (ref 135–145)
Total Bilirubin: 0.3 mg/dL (ref 0.2–1.2)
Total Protein: 6.4 g/dL (ref 6.0–8.3)

## 2023-11-08 LAB — CBC WITH DIFFERENTIAL/PLATELET
Basophils Absolute: 0.1 10*3/uL (ref 0.0–0.1)
Basophils Relative: 0.8 % (ref 0.0–3.0)
Eosinophils Absolute: 0.5 10*3/uL (ref 0.0–0.7)
Eosinophils Relative: 3.8 % (ref 0.0–5.0)
HCT: 37.2 % — ABNORMAL LOW (ref 39.0–52.0)
Hemoglobin: 12.3 g/dL — ABNORMAL LOW (ref 13.0–17.0)
Lymphocytes Relative: 10.8 % — ABNORMAL LOW (ref 12.0–46.0)
Lymphs Abs: 1.4 10*3/uL (ref 0.7–4.0)
MCHC: 33.2 g/dL (ref 30.0–36.0)
MCV: 84.9 fL (ref 78.0–100.0)
Monocytes Absolute: 0.9 10*3/uL (ref 0.1–1.0)
Monocytes Relative: 7.2 % (ref 3.0–12.0)
Neutro Abs: 9.8 10*3/uL — ABNORMAL HIGH (ref 1.4–7.7)
Neutrophils Relative %: 77.4 % — ABNORMAL HIGH (ref 43.0–77.0)
Platelets: 318 10*3/uL (ref 150.0–400.0)
RBC: 4.38 Mil/uL (ref 4.22–5.81)
RDW: 15 % (ref 11.5–15.5)
WBC: 12.6 10*3/uL — ABNORMAL HIGH (ref 4.0–10.5)

## 2023-11-08 LAB — PRO B NATRIURETIC PEPTIDE: NT-Pro BNP: 167 pg/mL (ref 0–486)

## 2023-11-08 LAB — TROPONIN I (HIGH SENSITIVITY): High Sens Troponin I: 17 ng/L (ref 2–17)

## 2023-11-08 NOTE — Telephone Encounter (Signed)
-----   Message from Garnette Gunner sent at 11/08/2023  8:38 AM EST ----- Regarding: Labs Did David Goodman's labs get collected?

## 2023-11-08 NOTE — Telephone Encounter (Signed)
Pt c/o Shortness Of Breath: STAT if SOB developed within the last 24 hours or pt is noticeably SOB on the phone  1. Are you currently SOB (can you hear that pt is SOB on the phone)? Yes but not bad  2. How long have you been experiencing SOB? 1 week   3. Are you SOB when sitting or when up moving around? Moving around  4. Are you currently experiencing any other symptoms? 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

## 2023-11-08 NOTE — Telephone Encounter (Signed)
Please advise. Was this patient's labs sent out yesterday?

## 2023-11-08 NOTE — Telephone Encounter (Signed)
Called patient back about his message. Patient complained of shortness of breath with activity and has chest tightness every once in a while. Patient has history of CAD with a stent. Patient is over due for appointment and will need a new provider since he was a Dr. Eldridge Dace patient. Dr. Eden Emms has the first available appointment on Thursday. Will schedule patient to come and get evaluated. Patient has nitroglycerin and encouraged patient to use it the next time he has tightness in his chest. Patient instructed to go to ED if symptoms get worse. Patient verbalized understanding.

## 2023-11-08 NOTE — Addendum Note (Signed)
Addended by: Tonita Phoenix on: 11/08/2023 10:14 AM   Modules accepted: Orders

## 2023-11-08 NOTE — Addendum Note (Signed)
Addended by: Tonita Phoenix on: 11/08/2023 10:13 AM   Modules accepted: Orders

## 2023-11-08 NOTE — Addendum Note (Signed)
Addended by: Tonita Phoenix on: 11/08/2023 10:24 AM   Modules accepted: Orders

## 2023-11-09 NOTE — Progress Notes (Unsigned)
Office Visit    Patient Name: David Goodman Date of Encounter: 11/09/2023  PCP:  Loyola Mast, MD   [facility]  Cardiologist:  Lance Muss, MD  Advanced Practice Provider:  No care team member to display Electrophysiologist:  None   HPI    David Goodman is a 82 y.o. male with a history of coronary artery disease (status post DES to proximal to mid LAD in 07/2021), heart failure with mildly reduced ejection fraction (echocardiogram 06/2239 to 45%, grade 1 DD, global HK, echo 11/2021 with LVEF 35 to 40%, started on Entresto. He is new to me having been followed by Dr Eldridge Dace in past  Echocardiogram 03/2022 with LVEF 40 to 45%, grade 1 DD) see, moderate mitral regurgitation by echo 11/2021 and mild 03/2022, hypertension, hyperlipidemia, glucose intolerance, GERD/PUD, iron deficiency anemia, aortic atherosclerosis presents today for follow-up visit.  Added on to my schedule due to recent increase in dyspnea and some chest tightness. Seen by primary 12/9 with CXR CE no overt CHF, ECG PVCls no acute ischemic changes started on lasix Cough unresponsive to albuterol, prednisone and Tessalon Perles Suspect cough is contributing to discomfort in chest  Troponin negative, BNP negative Strep and Flu titers negative Had COVID 5 months ago Testing 2 weeks ago negative ***  ***  Past Medical History    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)    HYPERLIPIDEMIA 07/13/2007   HYPERTENSION 07/13/2007   Impaired glucose  tolerance 09/27/2016   Iron deficiency anemia due to chronic blood loss 02/27/2021   NEPHROLITHIASIS, 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   NSVT (nonsustained ventricular tachycardia) (HCC)    PEPTIC ULCER DISEASE 07/13/2007   PVC's (premature ventricular contractions)    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   Past Surgical History:  Procedure Laterality Date   BROW LIFT Bilateral 11/10/2020   Procedure: BLEPHAROPLASTY;  Surgeon: Allena Napoleon, MD;  Location: [facility];  Service: Plastics;  Laterality: Bilateral;  1 hour   COLONOSCOPY N/A 04/12/2022   Procedure: COLONOSCOPY;  Surgeon: Willis Modena, MD;  Location: [facility];  Service: Gastroenterology;  Laterality: N/A;   COLONOSCOPY WITH PROPOFOL N/A 02/17/2019   Procedure: COLONOSCOPY WITH PROPOFOL;  Surgeon: [name], MD;  Location: [facility];  Service: Gastroenterology;  Laterality: N/A;   CORONARY STENT INTERVENTION N/A 08/25/2021   Procedure: CORONARY STENT INTERVENTION;  Surgeon: Corky Crafts, MD;  Location: [facility] INVASIVE CV LAB;  Service: Cardiovascular;  Laterality: N/A;   CORONARY ULTRASOUND/IVUS N/A 08/25/2021   Procedure: Intravascular Ultrasound/IVUS;  Surgeon: Corky Crafts, MD;  Location: [facility] INVASIVE CV LAB;  Service: Cardiovascular;  Laterality: N/A;   HYDROCELE EXCISION Right 05/15/2003   IR ANGIOGRAM VISCERAL SELECTIVE  02/16/2019   IR ANGIOGRAM VISCERAL SELECTIVE  02/16/2019   IR ANGIOGRAM VISCERAL SELECTIVE  02/16/2019   IR US GUIDE VASC ACCESS RIGHT  02/16/2019   LEFT HEART CATH AND CORONARY ANGIOGRAPHY N/A 07/28/2021   Procedure: LEFT HEART CATH AND CORONARY ANGIOGRAPHY;  Surgeon: Corky Crafts, MD;  Location: [facility]  INVASIVE CV LAB;  Service: Cardiovascular;  Laterality: N/A;   LEFT HEART CATH AND CORONARY ANGIOGRAPHY N/A 08/25/2021   Procedure: LEFT HEART CATH AND CORONARY ANGIOGRAPHY;  Surgeon: Corky Crafts, MD;  Location: [facility] INVASIVE CV LAB;  Service: Cardiovascular;  Laterality:  N/A;   PERIPHERAL INTRAVASCULAR LITHOTRIPSY  08/25/2021   Procedure: INTRAVASCULAR LITHOTRIPSY;  Surgeon: Corky Crafts, MD;  Location: [facility] INVASIVE CV LAB;  Service: Cardiovascular;;   ROTATOR CUFF REPAIR     SPERMATOCELECTOMY Right 05/15/2003   TOTAL KNEE ARTHROPLASTY Left 07/11/2023   Procedure: TOTAL KNEE ARTHROPLASTY;  Surgeon: Ollen Gross, MD;  Location: [facility];  Service: Orthopedics;  Laterality: Left;    Allergies  No Known Allergies EKGs/Labs/Other Studies Reviewed:   The following studies were reviewed today:  Monitor 11/2021 Normal sinus rhythm with rare SVT and NSVT. Frequent PVCs with rare PACs noted. No symptoms associated with premature beats. No atrial fibrillation.     Patch Wear Time:  2 days and 23 hours (2023-01-20T10:05:48-0500 to 2023-01-23T09:20:41-0500)   Patient had a min HR of 55 bpm, max HR of 226 bpm, and avg HR of 80 bpm. Predominant underlying rhythm was Sinus Rhythm. 14 Ventricular Tachycardia runs occurred, the run with the fastest interval lasting 4 beats with a max rate of 226 bpm, the longest  lasting 12 beats with an avg rate of 127 bpm. 10 Supraventricular Tachycardia runs occurred, the run with the fastest interval lasting 14 beats with a max rate of 184 bpm, the longest lasting 16 beats with an avg rate of 96 bpm. Isolated SVEs were rare  (<1.0%), SVE Couplets were rare (<1.0%), and SVE Triplets were rare (<1.0%). Isolated VEs were frequent (7.1%, 24152), VE Couplets were rare (<1.0%, 769), and VE Triplets were rare (<1.0%, 51). Ventricular Bigeminy and Trigeminy were present.   Echo 03/2022 IMPRESSIONS    1. Left ventricular ejection fraction, by estimation, is 40 to 45%. The left ventricle has mildly decreased function. The left ventricle demonstrates global hypokinesis. There is mild asymmetric left ventricular hypertrophy. Left ventricular diastolic parameters are consistent with Grade I diastolic dysfunction  (impaired relaxation).  2. Right ventricular systolic function is normal. The right ventricular size is normal.  3. The mitral valve is normal in structure. Mild mitral valve regurgitation. No evidence of mitral stenosis.  4. The aortic valve is tricuspid. Aortic valve regurgitation is not visualized. Aortic valve sclerosis/calcification is present, without any evidence of aortic stenosis.  5. The inferior vena cava is normal in size with greater than 50% respiratory variability, suggesting right atrial pressure of 3 mmHg.  Comparison(s): The left ventricular function has improved slighlty in 11/2021 was 35-40% now 40-45%.  FINDINGS  Left Ventricle: Left ventricular ejection fraction, by estimation, is 40 to 45%. The left ventricle has mildly decreased function. The left ventricle demonstrates global hypokinesis. The left ventricular internal cavity size was normal in size. There is  mild asymmetric left ventricular hypertrophy. Left ventricular diastolic parameters are consistent with Grade I diastolic dysfunction (impaired relaxation).  Right Ventricle: The right ventricular size is normal. No increase in right ventricular wall thickness. Right ventricular systolic function is normal.  Left Atrium: Left atrial size was normal in size.  Right Atrium: Right atrial size was normal in size.  Pericardium: There is no evidence of pericardial effusion. Presence of epicardial fat layer.  Mitral Valve: The mitral valve is normal in structure. Mild mitral valve regurgitation. No evidence of mitral valve stenosis.  Tricuspid Valve: The tricuspid valve is  normal in structure. Tricuspid valve regurgitation is trivial. No evidence of tricuspid stenosis.  Aortic Valve: The aortic valve is tricuspid. Aortic valve regurgitation is not visualized. Aortic valve sclerosis/calcification is present, without any evidence of aortic stenosis. Aortic valve mean gradient measures 8.0 mmHg. Aortic  valve peak gradient measures 15.2 mmHg. Aortic valve area, by VTI measures 2.02 cm.  Pulmonic Valve: The pulmonic valve was normal in structure. Pulmonic valve regurgitation is not visualized. No evidence of pulmonic stenosis.  Aorta: The aortic root is normal in size and structure.  Venous: The inferior vena cava is normal in size with greater than 50% respiratory variability, suggesting right atrial pressure of 3 mmHg.  IAS/Shunts: No atrial level shunt detected by color flow Doppler.    EKG:  EKG is not ordered today.   Recent Labs: 07/15/2023: ALT 17 08/02/2023: BUN 14; Creatinine, Ser 1.03; Hemoglobin 12.4; Magnesium 1.8; Platelets 478.0; Potassium 4.3; Sodium 135 11/07/2023: NT-Pro BNP 167  Recent Lipid Panel    Component Value Date/Time   CHOL 121 10/26/2022 1018   CHOL 114 12/09/2021 0838   TRIG 170.0 (H) 10/26/2022 1018   HDL 43.30 10/26/2022 1018   HDL 42 12/09/2021 0838   CHOLHDL 3 10/26/2022 1018   VLDL 34.0 10/26/2022 1018   LDLCALC 44 10/26/2022 1018   LDLCALC 41 12/09/2021 0838   LDLDIRECT 129.0 02/27/2021 0950    Home Medications   No outpatient medications have been marked as taking for the 11/10/23 encounter (Appointment) with Wendall Stade, MD.     Review of Systems      All other systems reviewed and are otherwise negative except as noted above.  Physical Exam    VS:  There were no vitals taken for this visit. , BMI There is no height or weight on file to calculate BMI.  Wt Readings from Last 3 Encounters:  11/07/23 166 lb 6.4 oz (75.5 kg)  10/26/23 164 lb 6.4 oz (74.6 kg)  10/21/23 166 lb (75.3 kg)     GEN: Well nourished, well developed, in no acute distress. HEENT: normal. Neck: Supple, no JVD, carotid bruits, or masses. Cardiac: RRR, no murmurs, rubs, or gallops. No clubbing, cyanosis, edema.  Radials/PT 2+ and equal bilaterally.  Respiratory:  Respirations regular and unlabored, clear to auscultation bilaterally. GI: Soft, nontender,  nondistended. MS: No deformity or atrophy. Skin: Warm and dry, no rash. Neuro:  Strength and sensation are intact. Psych: Normal affect.  Assessment & Plan    CAD -Last cath 07/28/21 with 3 vessel dx Elected to do shockwave lithotripsy and DES to proximal/mid LAD Had residual 60% mid RCA, 80% OM1, 80% D2 ***  HTN -Blood pressure well-controlled -Continue current medication regimen -Continue to monitor blood pressure at home  HFmEF -dyspnea-  update TTE  -Euvolemic on exam today, no lower extremity edema history -Continue current medications  HLD -Lab work was done November 2023 which showed LDL 44, HDL 43, triglycerides 170 -He is taking Crestor 20 mg once a day. -Would consider adding fenofibrate EITHER for better triglyceride control at next apt  TTE for ischemic DCM Pre cath labs Left /Right heart cath  F/U post cath .  Signed, Charlton Haws, MD 11/09/2023, 8:17 AM [facility]

## 2023-11-10 ENCOUNTER — Ambulatory Visit: Payer: Medicare Other | Admitting: Cardiovascular Disease

## 2023-11-10 ENCOUNTER — Emergency Department (HOSPITAL_COMMUNITY)
Admission: EM | Admit: 2023-11-10 | Discharge: 2023-11-10 | Disposition: A | Discharge status: Home / Self Care | Payer: Medicare Other | Attending: Emergency Medicine | Admitting: Emergency Medicine

## 2023-11-10 ENCOUNTER — Encounter (HOSPITAL_COMMUNITY): Payer: Self-pay

## 2023-11-10 ENCOUNTER — Telehealth: Payer: Self-pay

## 2023-11-10 ENCOUNTER — Emergency Department (HOSPITAL_COMMUNITY): Payer: Medicare Other

## 2023-11-10 DIAGNOSIS — I5022 Chronic systolic (congestive) heart failure: Secondary | ICD-10-CM

## 2023-11-10 DIAGNOSIS — I110 Hypertensive heart disease with heart failure: Secondary | ICD-10-CM | POA: Insufficient documentation

## 2023-11-10 DIAGNOSIS — Z955 Presence of coronary angioplasty implant and graft: Secondary | ICD-10-CM | POA: Diagnosis not present

## 2023-11-10 DIAGNOSIS — I493 Ventricular premature depolarization: Secondary | ICD-10-CM

## 2023-11-10 DIAGNOSIS — Z20822 Contact with and (suspected) exposure to covid-19: Secondary | ICD-10-CM | POA: Insufficient documentation

## 2023-11-10 DIAGNOSIS — Z79899 Other long term (current) drug therapy: Secondary | ICD-10-CM | POA: Diagnosis not present

## 2023-11-10 DIAGNOSIS — E876 Hypokalemia: Secondary | ICD-10-CM | POA: Diagnosis not present

## 2023-11-10 DIAGNOSIS — R0789 Other chest pain: Secondary | ICD-10-CM | POA: Insufficient documentation

## 2023-11-10 DIAGNOSIS — E785 Hyperlipidemia, unspecified: Secondary | ICD-10-CM | POA: Diagnosis not present

## 2023-11-10 DIAGNOSIS — R079 Chest pain, unspecified: Secondary | ICD-10-CM | POA: Diagnosis present

## 2023-11-10 DIAGNOSIS — I2510 Atherosclerotic heart disease of native coronary artery without angina pectoris: Secondary | ICD-10-CM | POA: Insufficient documentation

## 2023-11-10 LAB — CBC
HCT: 39 % (ref 39.0–52.0)
Hemoglobin: 13 g/dL (ref 13.0–17.0)
MCH: 28.3 pg (ref 26.0–34.0)
MCHC: 33.3 g/dL (ref 30.0–36.0)
MCV: 85 fL (ref 80.0–100.0)
Platelets: 322 10*3/uL (ref 150–400)
RBC: 4.59 MIL/uL (ref 4.22–5.81)
RDW: 14.7 % (ref 11.5–15.5)
WBC: 14.7 10*3/uL — ABNORMAL HIGH (ref 4.0–10.5)
nRBC: 0 % (ref 0.0–0.2)

## 2023-11-10 LAB — RESP PANEL BY RT-PCR (RSV, FLU A&B, COVID)  RVPGX2
Influenza A by PCR: NEGATIVE
Influenza B by PCR: NEGATIVE
Resp Syncytial Virus by PCR: NEGATIVE
SARS Coronavirus 2 by RT PCR: NEGATIVE

## 2023-11-10 LAB — TROPONIN I (HIGH SENSITIVITY)
Troponin I (High Sensitivity): 16 ng/L (ref <18)
Troponin I (High Sensitivity): 20 ng/L — ABNORMAL HIGH (ref <18)

## 2023-11-10 LAB — BASIC METABOLIC PANEL
Anion gap: 13 (ref 5–15)
BUN: 25 mg/dL — ABNORMAL HIGH (ref 8–23)
CO2: 23 mmol/L (ref 22–32)
Calcium: 9.1 mg/dL (ref 8.9–10.3)
Chloride: 99 mmol/L (ref 98–111)
Creatinine, Ser: 0.99 mg/dL (ref 0.61–1.24)
GFR, Estimated: >60 mL/min (ref >60)
Glucose, Bld: 105 mg/dL — ABNORMAL HIGH (ref 70–99)
Potassium: 3.2 mmol/L — ABNORMAL LOW (ref 3.5–5.1)
Sodium: 135 mmol/L (ref 135–145)

## 2023-11-10 LAB — HEPATIC FUNCTION PANEL
ALT: 22 U/L (ref 0–44)
AST: 24 U/L (ref 15–41)
Albumin: 4.1 g/dL (ref 3.5–5.0)
Alkaline Phosphatase: 47 U/L (ref 38–126)
Bilirubin, Direct: 0.1 mg/dL (ref 0.0–0.2)
Indirect Bilirubin: 0.4 mg/dL (ref 0.3–0.9)
Total Bilirubin: 0.5 mg/dL (ref <1.2)
Total Protein: 7.3 g/dL (ref 6.5–8.1)

## 2023-11-10 LAB — BRAIN NATRIURETIC PEPTIDE: B Natriuretic Peptide: 53.2 pg/mL (ref 0.0–100.0)

## 2023-11-10 MED ORDER — GUAIFENESIN 100 MG/5ML PO LIQD
5.0000 mL | Freq: Once | ORAL | Status: AC
  Administered 2023-11-10: 5 mL via ORAL
  Filled 2023-11-10: qty 10

## 2023-11-10 MED ORDER — POTASSIUM CHLORIDE CRYS ER 20 MEQ PO TBCR
40.0000 meq | EXTENDED_RELEASE_TABLET | Freq: Once | ORAL | Status: AC
  Administered 2023-11-10: 40 meq via ORAL
  Filled 2023-11-10: qty 2

## 2023-11-10 MED ORDER — POTASSIUM CHLORIDE ER 10 MEQ PO TBCR: 20.0000 meq | EXTENDED_RELEASE_TABLET | Freq: Once | ORAL | 0 refills | Status: DC

## 2023-11-10 MED ORDER — POTASSIUM CHLORIDE ER 20 MEQ PO TBCR: EXTENDED_RELEASE_TABLET | ORAL | 0 refills | Status: DC

## 2023-11-10 MED ORDER — HYDROCOD POLI-CHLORPHE POLI ER 10-8 MG/5ML PO SUER: 5.0000 mL | Freq: Two times a day (BID) | ORAL | 0 refills | Status: DC | PRN

## 2023-11-10 NOTE — ED Provider Notes (Signed)
[facility]PARTMENT AT [facility] Provider Note   CSN: [id number] Arrival date & time: 11/10/23  4098     History  Chief Complaint  Patient presents with   Chest Pain    David Goodman is a 82 y.o. male.  Patient has a history of coronary artery disease.  He presents with cough and chest pain.  The history is provided by the patient and medical records. No language interpreter was used.  Chest Pain Pain location:  L chest Pain quality: aching   Pain radiates to:  Does not radiate Pain severity:  Mild Onset quality:  Sudden Timing:  Intermittent Progression:  Waxing and waning Chronicity:  New Context: not breathing   Relieved by:  Nothing Worsened by:  Nothing Associated symptoms: no abdominal pain, no back pain, no cough, no fatigue and no headache        Home Medications Prior to Admission medications   Medication Sig Start Date End Date Taking? Authorizing Provider  chlorpheniramine-HYDROcodone (TUSSIONEX) 10-8 MG/5ML Take 5 mLs by mouth every 12 (twelve) hours as needed for cough. 11/10/23  Yes Bethann Berkshire, MD  potassium chloride (KLOR-CON) 10 MEQ tablet Take 2 tablets (20 mEq total) by mouth once for 1 dose. 11/10/23 11/10/23 Yes Bethann Berkshire, MD  albuterol (VENTOLIN HFA) 108 (90 Base) MCG/ACT inhaler Inhale 2 puffs into the lungs every 6 (six) hours as needed. 10/26/23   Nche, Bonna Gains, NP  Ascorbic Acid (VITAMIN C) 1000 MG tablet Take 1,000 mg by mouth 2 (two) times a week.    [provider]  benzonatate (TESSALON) 100 MG capsule Take 1-2 capsules (100-200 mg total) by mouth 3 (three) times daily as needed. 10/26/23   Nche, Bonna Gains, NP  Cyanocobalamin (B-12 PO) Take 1 tablet by mouth daily.    [provider]  dextromethorphan-guaiFENesin (MUCINEX DM) 30-600 MG 12hr tablet Take 1 tablet by mouth 2 (two) times daily as needed for cough. 10/26/23   Nche, Bonna Gains, NP  docusate sodium (COLACE) 250 MG  capsule Take 250 mg by mouth daily.    [provider]  furosemide (LASIX) 20 MG tablet Take 1 tablet (20 mg total) by mouth daily. 11/07/23 12/07/23  Garnette Gunner, MD  hydrochlorothiazide (HYDRODIURIL) 25 MG tablet TAKE 1 TABLET BY MOUTH DAILY 08/15/23   Loyola Mast, MD  latanoprost (XALATAN) 0.005 % ophthalmic solution Place 1 drop into both eyes at bedtime. 06/20/17   Gordy Savers, MD  MAGNESIUM PO Take 400 mg by mouth at bedtime.    [provider]  methocarbamol (ROBAXIN) 500 MG tablet Take 1 tablet (500 mg total) by mouth every 6 (six) hours as needed for muscle spasms. 07/12/23   Edmisten, Lyn Hollingshead, PA  methylPREDNISolone (MEDROL DOSEPAK) 4 MG TBPK tablet Take as directed on package 10/26/23   Nche, Bonna Gains, NP  nitroGLYCERIN (NITROSTAT) 0.4 MG SL tablet Place 1 tablet (0.4 mg total) under the tongue every 5 (five) minutes as needed for chest pain. 07/22/21   Corky Crafts, MD  ondansetron (ZOFRAN) 4 MG tablet Take 1 tablet (4 mg total) by mouth every 6 (six) hours as needed for nausea. Patient taking differently: Take 4 mg by mouth as needed for nausea or vomiting. 07/12/23   Edmisten, Kristie L, PA  pantoprazole (PROTONIX) 40 MG tablet Take 1 tablet (40 mg total) by mouth daily. 11/01/23   Joylene Grapes, NP  Polyvinyl Alcohol-Povidone (REFRESH OP) Place 1 drop into the left  eye 2 (two) times daily as needed (dryness).    [provider]  psyllium (METAMUCIL) 58.6 % powder Take 1 packet by mouth daily.    [provider]  rosuvastatin (CRESTOR) 20 MG tablet Take 1 tablet by mouth once daily 01/24/23   Loyola Mast, MD  tadalafil (CIALIS) 20 MG tablet Take 1 tablet (20 mg total) by mouth daily as needed for erectile dysfunction. 03/06/20   Philip Aspen, Limmie Patricia, MD  tamsulosin (FLOMAX) 0.4 MG CAPS capsule TAKE 1 CAPSULE BY MOUTH DAILY 05/30/23   Loyola Mast, MD  valsartan (DIOVAN) 320 MG tablet Take 1 tablet by mouth once daily  09/23/23   Corky Crafts, MD  zinc gluconate 50 MG tablet Take 50 mg by mouth daily.    [provider]      Allergies    Patient has no known allergies.    Review of Systems   Review of Systems  Constitutional:  Negative for appetite change and fatigue.  HENT:  Negative for congestion, ear discharge and sinus pressure.   Eyes:  Negative for discharge.  Respiratory:  Negative for cough.   Cardiovascular:  Positive for chest pain.  Gastrointestinal:  Negative for abdominal pain and diarrhea.  Genitourinary:  Negative for frequency and hematuria.  Musculoskeletal:  Negative for back pain.  Skin:  Negative for rash.  Neurological:  Negative for seizures and headaches.  Psychiatric/Behavioral:  Negative for hallucinations.     Physical Exam Updated Vital Signs BP (!) 141/72   Pulse 81   Temp 98.2 F (36.8 C) (Axillary)   Resp (!) 21   SpO2 92%  Physical Exam Vitals and nursing note reviewed.  Constitutional:      Appearance: He is well-developed.  HENT:     Head: Normocephalic.  Eyes:     General: No scleral icterus.    Conjunctiva/sclera: Conjunctivae normal.  Neck:     Thyroid: No thyromegaly.  Cardiovascular:     Rate and Rhythm: Normal rate and regular rhythm.     Heart sounds: No murmur heard.    No friction rub. No gallop.  Pulmonary:     Breath sounds: No stridor. No wheezing or rales.  Chest:     Chest wall: No tenderness.  Abdominal:     General: There is no distension.     Tenderness: There is no abdominal tenderness. There is no rebound.  Musculoskeletal:        General: Normal range of motion.     Cervical back: Neck supple.  Lymphadenopathy:     Cervical: No cervical adenopathy.  Skin:    Findings: No erythema or rash.  Neurological:     Mental Status: He is alert and oriented to person, place, and time.     Motor: No abnormal muscle tone.     Coordination: Coordination normal.  Psychiatric:        Behavior: Behavior normal.      ED Results / Procedures / Treatments   Labs (all labs ordered are listed, but only abnormal results are displayed) Labs Reviewed  BASIC METABOLIC PANEL - Abnormal; Notable for the following components:      Result Value   Potassium 3.2 (*)    Glucose, Bld 105 (*)    BUN 25 (*)    All other components within normal limits  CBC - Abnormal; Notable for the following components:   WBC 14.7 (*)    All other components within normal limits  TROPONIN I (  HIGH SENSITIVITY) - Abnormal; Notable for the following components:   Troponin I (High Sensitivity) 20 (*)    All other components within normal limits  RESP PANEL BY RT-PCR (RSV, FLU A&B, COVID)  [id number]  HEPATIC FUNCTION PANEL  BRAIN NATRIURETIC PEPTIDE  TROPONIN I (HIGH SENSITIVITY)    EKG EKG Interpretation Date/Time:  Thursday November 10 2023 09:29:36 EST Ventricular Rate:  79 PR Interval:  160 QRS Duration:  110 QT Interval:  399 QTC Calculation: 458 R Axis:   11  Text Interpretation: Sinus rhythm Atrial premature complex Confirmed by Bethann Berkshire [phone]) on 11/10/2023 9:39:14 AM  Radiology DG Chest 2 View Result Date: 11/10/2023 CLINICAL DATA:  Chest pain for the past 3 days. EXAM: CHEST - 2 VIEW COMPARISON:  Chest x-ray dated October 31, 2023. FINDINGS: The heart size and mediastinal contours are within normal limits. Normal pulmonary vascularity. Unchanged mild scarring at the right-greater-than-left lung bases. No focal consolidation, pleural effusion, or pneumothorax. No acute osseous abnormality. IMPRESSION: 1. No acute cardiopulmonary disease. Electronically Signed   By: Obie Dredge M.D.   On: 11/10/2023 10:56    Procedures Procedures    Medications Ordered in ED Medications  potassium chloride SA (KLOR-CON M) CR tablet 40 mEq (has no administration in time range)  guaiFENesin (ROBITUSSIN) 100 MG/5ML liquid 5 mL (5 mLs Oral Given 11/10/23 1314)    ED Course/ Medical Decision Making/ A&P                                  Medical Decision Making Amount and/or Complexity of Data Reviewed Labs: ordered. Radiology: ordered.  Risk OTC drugs. Prescription drug management.   Hypokalemia and atypical chest discomfort with coronary disease        Final Clinical Impression(s) / ED Diagnoses Final diagnoses:  None    Rx / DC Orders ED Discharge Orders          Ordered    potassium chloride (KLOR-CON) 10 MEQ tablet   Once        11/10/23 1458    chlorpheniramine-HYDROcodone (TUSSIONEX) 10-8 MG/5ML  Every 12 hours PRN        11/10/23 1458              Bethann Berkshire, MD 11/14/23 1700

## 2023-11-10 NOTE — Telephone Encounter (Signed)
Spoke to patient advised I got a message from Dr.Schumann to schedule appointment with him 12/17 at 11:00 am at [facility] office.

## 2023-11-10 NOTE — Discharge Instructions (Signed)
Stop taking the Lasix every day.  Check your weight daily and if you gain 3 pounds then start taking the Lasix.  You have been given prescriptions for potassium and cough medicine and you are to follow-up with your cardiologist in the next couple weeks.  They will give you an appointment.  Also follow-up with your family doctor in the next couple weeks if the cough does not improve

## 2023-11-10 NOTE — Consult Note (Addendum)
Cardiology Consultation   Patient ID: Darick Safer MRN: [id number]; DOB: 07-Jul-1941  Admit date: 11/10/2023 Date of Consult: 11/10/2023  PCP:  Loyola Mast, MD   [facility] Providers Cardiologist:  Lance Muss, MD        Patient Profile:   David Goodman is a 82 y.o. male with a hx of CAD, chronic systolic heart failure, but regurgitation, hypertension, hyperlipidemia, bronchiectasis who is being seen 11/10/2023 for the evaluation of chest pain at the request of Dr Estell Harpin.  History of Present Illness:   Mr. Grabinski is an 82 year old male with above medical history who were consulted for evaluation of chest pain.  He has a history of CAD and underwent DES to proximal to mid LAD in 07/2021.  Also on the cath at 80% OM1 stenosis, 80% D2, 50% distal LAD, 60% mid RCA; medical management was recommended.  Most recent echocardiogram 03/2022 showed EF 40 to 45%.  He presented to PCP on 12/9 and reported worsening cough and shortness of breath.  He was started on Lasix 20 mg daily.  He presented today to ED for chest pain.  He describes dull aching left-sided chest pain, states that it does not feel similar to pain he had prior to his stent.  Pain is unrelated to exertion.  Can last for hours when it occurs.  Vitals notable for BP 110/98, pulse 89, SpO2 98% on room air.  Labs notable for creatinine 0.99, potassium 3.2, BNP 53, troponin 16 > 20, hemoglobin 13, platelets 322.  Chest x-ray unremarkable.  EKG showed normal sinus rhythm with PAC, rate 79, poor R wave progression.   Past Medical History:  Diagnosis Date   ALLERGIC RHINITIS 07/13/2007   Aorta dilated on Echo; Normal sized Aorta on CT    Chest CT 1/23: No thoracic aortic aneurysm; coronary calcifications; aortic atherosclerosis; improved aeration of lungs with persistent minimal reticular opacities   CAD (coronary artery disease)    CHF (congestive heart failure) (HCC)    Diverticulitis    GERD (gastroesophageal  reflux disease)    GI bleed    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)    HYPERLIPIDEMIA 07/13/2007   HYPERTENSION 07/13/2007   Impaired glucose tolerance 09/27/2016   Iron deficiency anemia due to chronic blood loss 02/27/2021   NEPHROLITHIASIS, 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   NSVT (nonsustained ventricular tachycardia) (HCC)    PEPTIC ULCER DISEASE 07/13/2007   PVC's (premature ventricular contractions)    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    Past Surgical History:  Procedure Laterality Date   BROW LIFT Bilateral 11/10/2020   Procedure: BLEPHAROPLASTY;  Surgeon: Allena Napoleon, MD;  Location: [facility];  Service: Plastics;  Laterality: Bilateral;  1 hour   COLONOSCOPY N/A 04/12/2022   Procedure: COLONOSCOPY;  Surgeon: Willis Modena, MD;  Location: [facility];  Service: Gastroenterology;  Laterality: N/A;   COLONOSCOPY WITH PROPOFOL N/A 02/17/2019   Procedure: COLONOSCOPY WITH PROPOFOL;  Surgeon: [name], MD;  Location: [facility];  Service: Gastroenterology;  Laterality: N/A;   CORONARY STENT INTERVENTION N/A 08/25/2021   Procedure: CORONARY STENT INTERVENTION;  Surgeon: Corky Crafts, MD;  Location: [facility] INVASIVE CV LAB;  Service: Cardiovascular;  Laterality: N/A;   CORONARY ULTRASOUND/IVUS N/A 08/25/2021   Procedure: Intravascular Ultrasound/IVUS;  Surgeon: Corky Crafts, MD;  Location: [facility] INVASIVE CV LAB;  Service: Cardiovascular;  Laterality: N/A;   HYDROCELE EXCISION Right 05/15/2003   IR ANGIOGRAM VISCERAL SELECTIVE  02/16/2019   IR ANGIOGRAM VISCERAL  SELECTIVE  02/16/2019   IR ANGIOGRAM VISCERAL SELECTIVE  02/16/2019   IR US GUIDE VASC ACCESS RIGHT  02/16/2019   LEFT HEART CATH AND CORONARY ANGIOGRAPHY N/A 07/28/2021   Procedure: LEFT HEART CATH AND CORONARY ANGIOGRAPHY;  Surgeon: Corky Crafts, MD;  Location: [facility] INVASIVE CV LAB;  Service: Cardiovascular;  Laterality: N/A;   LEFT HEART CATH AND CORONARY ANGIOGRAPHY N/A  08/25/2021   Procedure: LEFT HEART CATH AND CORONARY ANGIOGRAPHY;  Surgeon: Corky Crafts, MD;  Location: [facility] INVASIVE CV LAB;  Service: Cardiovascular;  Laterality: N/A;   PERIPHERAL INTRAVASCULAR LITHOTRIPSY  08/25/2021   Procedure: INTRAVASCULAR LITHOTRIPSY;  Surgeon: Corky Crafts, MD;  Location: [facility] INVASIVE CV LAB;  Service: Cardiovascular;;   ROTATOR CUFF REPAIR     SPERMATOCELECTOMY Right 05/15/2003   TOTAL KNEE ARTHROPLASTY Left 07/11/2023   Procedure: TOTAL KNEE ARTHROPLASTY;  Surgeon: Ollen Gross, MD;  Location: [facility];  Service: Orthopedics;  Laterality: Left;     Inpatient Medications: Scheduled Meds:  Continuous Infusions:  PRN Meds:   Allergies:   No Known Allergies  Social History:   Social History   Socioeconomic History   Marital status: Married    Spouse name: Not on file   Number of children: Not on file   Years of education: Not on file   Highest education level: 12th grade  Occupational History   Not on file  Tobacco Use   Smoking status: Never   Smokeless tobacco: Never  Vaping Use   Vaping status: Never Used  Substance and Sexual Activity   Alcohol use: Yes    Comment: couple times a week - wine   Drug use: No   Sexual activity: Not Currently  Other Topics Concern   Not on file  Social History Narrative   Lives alone   Right handed   Caffeine: 1 cup of coffee a day   Social Drivers of Corporate investment banker Strain: Low Risk  (06/02/2023)   Overall Financial Resource Strain (CARDIA)    Difficulty of Paying Living Expenses: Not hard at all  Food Insecurity: No Food Insecurity (07/21/2023)   Hunger Vital Sign    Worried About Running Out of Food in the Last Year: Never true    Ran Out of Food in the Last Year: Never true  Transportation Needs: No Transportation Needs (07/21/2023)   PRAPARE - Administrator, Civil Service (Medical): No    Lack of Transportation (Non-Medical): No  Physical Activity: Sufficiently  Active (06/02/2023)   Exercise Vital Sign    Days of Exercise per Week: 3 days    Minutes of Exercise per Session: 50 min  Stress: No Stress Concern Present (06/02/2023)   [employer] of Occupational Health - Occupational Stress Questionnaire    Feeling of Stress : Not at all  Social Connections: Moderately Integrated (06/02/2023)   Social Connection and Isolation Panel [NHANES]    Frequency of Communication with Friends and Family: More than three times a week    Frequency of Social Gatherings with Friends and Family: Once a week    Attends Religious Services: More than 4 times per year    Active Member of [employer] or Organizations: No    Attends Banker Meetings: Not on file    Marital Status: Married  Intimate Partner Violence: Not At Risk (07/15/2023)   Humiliation, Afraid, Rape, and Kick questionnaire    Fear of Current or Ex-Partner: No    Emotionally Abused: No  Physically Abused: No    Sexually Abused: No    Family History:    Family History  Problem Relation Age of Onset   Cancer Brother      ROS:  Please see the history of present illness.   All other ROS reviewed and negative.     Physical Exam/Data:   Vitals:   11/10/23 0928 11/10/23 1250 11/10/23 1318  BP: (!) 110/98 (!) 141/72   Pulse: 89 81   Resp: (!) 26 (!) 21   Temp: 98 F (36.7 C)  98.2 F (36.8 C)  TempSrc: Oral  Axillary  SpO2: 98% 92%    No intake or output data in the 24 hours ending 11/10/23 1407    11/07/2023    4:00 PM 10/26/2023    1:52 PM 10/21/2023   10:31 AM  Last 3 Weights  Weight (lbs) 166 lb 6.4 oz 164 lb 6.4 oz 166 lb  Weight (kg) 75.479 kg 74.571 kg 75.297 kg     There is no height or weight on file to calculate BMI.  General:  Well nourished, well developed, in no acute distress HEENT: normal Neck: no JVD Cardiac:  normal S1, S2; normal rate, irregular,  2/6 systolic murmur Lungs:  clear to auscultation bilaterally, no wheezing, rhonchi or rales  Abd: soft,  nontender, no hepatomegaly  Ext: no edema Musculoskeletal:  No deformities, BUE and BLE strength normal and equal Skin: warm and dry  Neuro:  no focal abnormalities noted Psych:  Normal affect   EKG:  The EKG was personally reviewed and demonstrates:  EKG showed normal sinus rhythm with PAC, rate 79, poor R wave progression. Telemetry:  Telemetry was personally reviewed and demonstrates: Normal sinus rhythm with frequent PVCs, NSVT up to 6 beats  Relevant CV Studies:   Laboratory Data:  High Sensitivity Troponin:   Recent Labs  Lab 11/10/23 1007 11/10/23 1249  TROPONINIHS 16 20*     Chemistry Recent Labs  Lab 11/08/23 1004 11/10/23 1007  NA 135 135  K 3.3* 3.2*  CL 100 99  CO2 26 23  GLUCOSE 88 105*  BUN 20 25*  CREATININE 0.81 0.99  CALCIUM 8.5 9.1  GFRNONAA  --  >60  ANIONGAP  --  13    Recent Labs  Lab 11/08/23 1004 11/10/23 1007  PROT 6.4 7.3  ALBUMIN 3.9 4.1  AST 20 24  ALT 20 22  ALKPHOS 46 47  BILITOT 0.3 0.5   Lipids No results for input(s): "CHOL", "TRIG", "HDL", "LABVLDL", "LDLCALC", "CHOLHDL" in the last 168 hours.  Hematology Recent Labs  Lab 11/08/23 1004 11/10/23 1007  WBC 12.6* 14.7*  RBC 4.38 4.59  HGB 12.3* 13.0  HCT 37.2* 39.0  MCV 84.9 85.0  MCH  --  28.3  MCHC 33.2 33.3  RDW 15.0 14.7  PLT 318.0 322   Thyroid No results for input(s): "TSH", "FREET4" in the last 168 hours.  BNP Recent Labs  Lab 11/07/23 1616 11/10/23 1007  BNP  --  53.2  PROBNP 167  --     DDimer No results for input(s): "DDIMER" in the last 168 hours.   Radiology/Studies:  DG Chest 2 View Result Date: 11/10/2023 CLINICAL DATA:  Chest pain for the past 3 days. EXAM: CHEST - 2 VIEW COMPARISON:  Chest x-ray dated October 31, 2023. FINDINGS: The heart size and mediastinal contours are within normal limits. Normal pulmonary vascularity. Unchanged mild scarring at the right-greater-than-left lung bases. No focal consolidation, pleural effusion, or  pneumothorax. No acute osseous abnormality. IMPRESSION: 1. No acute cardiopulmonary disease. Electronically Signed   By: Obie Dredge M.D.   On: 11/10/2023 10:56     Assessment and Plan:   Chest pain: Atypical in description.  EKG without ischemic changes.  Troponins mildly elevated but flat (16 > 20), not consistent with acute coronary syndrome.  Recommend outpatient f/u to consider ischemic evaluation  Frequent PVCs: Also with brief NSVT.  Suspect this is driven by low potassium, 3.2 today.  He was recently started on Lasix in addition to taking HCTZ.  Discussed admission to replete his electrolytes, monitor on telemetry, and update echocardiogram.  He declines admission, adamant about discharge from ED.  Recommend only taking Lasix as needed, would take 20 mg daily as needed if gains more than 3 pounds in 1 day or 5 pounds in 1 week.  Recommend adding potassium chloride 20 mEq daily and will schedule close f/u, consider Zio patch to quantify PVC burden if continuing to have frequent PVCs  Chronic systolic heart failure: Last echo 03/2022 showed EF 40 to 45%.  Appears euvolemic.  Will update echocardiogram as outpatient  Patient is adamant about discharge from ED, will schedule close outpatient follow-up; scheduled for f/u on 12/17    For questions or updates, please contact [facility] Please consult [email or web address] for contact info under    Signed, Little Ishikawa, MD  11/10/2023 2:07 PM

## 2023-11-10 NOTE — ED Triage Notes (Signed)
Pt presents with c/o chest pain that started 3 days ago. Pt reports that he was seen at his MD and told to come here if the pain returned. Pt reports the pain is on the left side of his chest and that it is intermittent.

## 2023-11-13 NOTE — Progress Notes (Deleted)
Cardiology Office Note:    Date:  11/13/2023   ID:  David Goodman, DOB 1941/09/25, MRN [id number]  PCP:  Loyola Mast, MD  Cardiologist:  Lance Muss, MD  Electrophysiologist:  None   Referring MD: Loyola Mast, MD   No chief complaint on file. ***  History of Present Illness:    David Goodman is a 82 y.o. male with a hx of CAD, chronic systolic heart failure, mitral regurgitation, hypertension, hyperlipidemia, bronchiectasis who presents for follow-up.  Previously followed with Dr. Eldridge Dace, last seen 06/2022.  He has a history of CAD and underwent DES to proximal to mid LAD in 07/2021.  Also with 80% OM1 stenosis, 80% D2, 50% distal LAD, 60% mid RCA; medical management was recommended.  Echocardiogram 03/2022 showed EF 40 to 45%.    He presented to PCP on 11/07/23 and reported worsening cough and shortness of breath.  He was started on Lasix 20 mg daily.  He presented on 11/10/23 to ED for chest pain.  He describes dull aching left-sided chest pain, states that it does not feel similar to pain he had prior to his stent.  Pain is unrelated to exertion.  Can last for hours when it occurs.  Troponins mildly elevated but flat (16 > 20).  Noted to have frequent PVCs and brief NSVT.  Suspected due to hypokalemia, was recommended admission for repletion of electrolytes and monitoring on telemetry and update echocardiogram, but he declined.  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)    HYPERLIPIDEMIA 07/13/2007   HYPERTENSION 07/13/2007    Impaired glucose tolerance 09/27/2016   Iron deficiency anemia due to chronic blood loss 02/27/2021   NEPHROLITHIASIS, 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   NSVT (nonsustained ventricular tachycardia) (HCC)    PEPTIC ULCER DISEASE 07/13/2007   PVC's (premature ventricular contractions)    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    Past Surgical History:  Procedure Laterality Date   BROW LIFT Bilateral 11/10/2020   Procedure: BLEPHAROPLASTY;  Surgeon: Allena Napoleon, MD;  Location: [facility];  Service: Plastics;  Laterality: Bilateral;  1 hour   COLONOSCOPY N/A 04/12/2022   Procedure: COLONOSCOPY;  Surgeon: Willis Modena, MD;  Location: [facility];  Service: Gastroenterology;  Laterality: N/A;   COLONOSCOPY WITH PROPOFOL N/A 02/17/2019   Procedure: COLONOSCOPY WITH PROPOFOL;  Surgeon: [name], MD;  Location: [facility];  Service: Gastroenterology;  Laterality: N/A;   CORONARY STENT INTERVENTION N/A 08/25/2021   Procedure: CORONARY STENT INTERVENTION;  Surgeon: Corky Crafts, MD;  Location: [facility] INVASIVE CV LAB;  Service: Cardiovascular;  Laterality: N/A;   CORONARY ULTRASOUND/IVUS N/A 08/25/2021   Procedure: Intravascular Ultrasound/IVUS;  Surgeon: Corky Crafts, MD;  Location: [facility] INVASIVE CV LAB;  Service: Cardiovascular;  Laterality: N/A;   HYDROCELE EXCISION Right 05/15/2003   IR ANGIOGRAM VISCERAL SELECTIVE  02/16/2019   IR ANGIOGRAM VISCERAL SELECTIVE  02/16/2019   IR ANGIOGRAM VISCERAL SELECTIVE  02/16/2019   IR US GUIDE VASC ACCESS RIGHT  02/16/2019   LEFT HEART CATH AND CORONARY ANGIOGRAPHY N/A 07/28/2021  Procedure: LEFT HEART CATH AND CORONARY ANGIOGRAPHY;  Surgeon: Corky Crafts, MD;  Location: [facility] INVASIVE CV LAB;  Service: Cardiovascular;  Laterality: N/A;   LEFT HEART CATH AND CORONARY ANGIOGRAPHY N/A 08/25/2021   Procedure: LEFT HEART CATH AND CORONARY ANGIOGRAPHY;  Surgeon: Corky Crafts, MD;  Location: [facility] INVASIVE CV LAB;  Service:  Cardiovascular;  Laterality: N/A;   PERIPHERAL INTRAVASCULAR LITHOTRIPSY  08/25/2021   Procedure: INTRAVASCULAR LITHOTRIPSY;  Surgeon: Corky Crafts, MD;  Location: [facility] INVASIVE CV LAB;  Service: Cardiovascular;;   ROTATOR CUFF REPAIR     SPERMATOCELECTOMY Right 05/15/2003   TOTAL KNEE ARTHROPLASTY Left 07/11/2023   Procedure: TOTAL KNEE ARTHROPLASTY;  Surgeon: Ollen Gross, MD;  Location: [facility];  Service: Orthopedics;  Laterality: Left;    Current Medications: No outpatient medications have been marked as taking for the 11/15/23 encounter (Appointment) with Little Ishikawa, MD.     Allergies:   Patient has no known allergies.   Social History   Socioeconomic History   Marital status: Married    Spouse name: Not on file   Number of children: Not on file   Years of education: Not on file   Highest education level: 12th grade  Occupational History   Not on file  Tobacco Use   Smoking status: Never   Smokeless tobacco: Never  Vaping Use   Vaping status: Never Used  Substance and Sexual Activity   Alcohol use: Yes    Comment: couple times a week - wine   Drug use: No   Sexual activity: Not Currently  Other Topics Concern   Not on file  Social History Narrative   Lives alone   Right handed   Caffeine: 1 cup of coffee a day   Social Drivers of Corporate investment banker Strain: Low Risk  (06/02/2023)   Overall Financial Resource Strain (CARDIA)    Difficulty of Paying Living Expenses: Not hard at all  Food Insecurity: No Food Insecurity (07/21/2023)   Hunger Vital Sign    Worried About Running Out of Food in the Last Year: Never true    Ran Out of Food in the Last Year: Never true  Transportation Needs: No Transportation Needs (07/21/2023)   PRAPARE - Administrator, Civil Service (Medical): No    Lack of Transportation (Non-Medical): No  Physical Activity: Sufficiently Active (06/02/2023)   Exercise Vital Sign    Days of Exercise per Week: 3  days    Minutes of Exercise per Session: 50 min  Stress: No Stress Concern Present (06/02/2023)   [employer] of Occupational Health - Occupational Stress Questionnaire    Feeling of Stress : Not at all  Social Connections: Moderately Integrated (06/02/2023)   Social Connection and Isolation Panel [NHANES]    Frequency of Communication with Friends and Family: More than three times a week    Frequency of Social Gatherings with Friends and Family: Once a week    Attends Religious Services: More than 4 times per year    Active Member of [employer] or Organizations: No    Attends Engineer, structural: Not on file    Marital Status: Married     Family History: The patient's ***family history includes Cancer in his brother.  ROS:   Please see the history of present illness.    *** All other systems reviewed and are negative.  EKGs/Labs/Other Studies Reviewed:    The following studies were reviewed today: ***  EKG:  EKG  is *** ordered today.  The ekg ordered today demonstrates ***  Recent Labs: 08/02/2023: Magnesium 1.8 11/07/2023: NT-Pro BNP 167 11/10/2023: ALT 22; B Natriuretic Peptide 53.2; BUN 25; Creatinine, Ser 0.99; Hemoglobin 13.0; Platelets 322; Potassium 3.2; Sodium 135  Recent Lipid Panel    Component Value Date/Time   CHOL 121 10/26/2022 1018   CHOL 114 12/09/2021 0838   TRIG 170.0 (H) 10/26/2022 1018   HDL 43.30 10/26/2022 1018   HDL 42 12/09/2021 0838   CHOLHDL 3 10/26/2022 1018   VLDL 34.0 10/26/2022 1018   LDLCALC 44 10/26/2022 1018   LDLCALC 41 12/09/2021 0838   LDLDIRECT 129.0 02/27/2021 0950    Physical Exam:    VS:  There were no vitals taken for this visit.    Wt Readings from Last 3 Encounters:  11/07/23 166 lb 6.4 oz (75.5 kg)  10/26/23 164 lb 6.4 oz (74.6 kg)  10/21/23 166 lb (75.3 kg)     GEN: *** Well nourished, well developed in no acute distress HEENT: Normal NECK: No JVD; No carotid bruits LYMPHATICS: No lymphadenopathy CARDIAC:  ***RRR, no murmurs, rubs, gallops RESPIRATORY:  Clear to auscultation without rales, wheezing or rhonchi  ABDOMEN: Soft, non-tender, non-distended MUSCULOSKELETAL:  No edema; No deformity  SKIN: Warm and dry NEUROLOGIC:  Alert and oriented x 3 PSYCHIATRIC:  Normal affect   ASSESSMENT:    No diagnosis found. PLAN:    CAD: has a history of CAD and underwent DES to proximal to mid LAD in 07/2021.  Also with 80% OM1 stenosis, 80% D2, 50% distal LAD, 60% mid RCA; medical management was recommended.  Echocardiogram 03/2022 showed EF 40 to 45%.  Seen in ED 11/10/2023 with atypical chest pain.  Troponins mildly elevated but flat (16 > 20), not consistent with acute coronary syndrome.  Recommend outpatient f/u to consider ischemic evaluation -Recommend stress PET for further evaluation -Aspirin*** -Continue rosuvastatin 20 mg daily   Frequent PVCs: Noted during ED visit 11/10/2023.  Also with brief NSVT.  Suspect this is driven by low potassium, 3.2 today.  He was recently started on Lasix in addition to taking HCTZ.  Discussed admission to replete his electrolytes, monitor on telemetry, and update echocardiogram.  He declined admission, adamant about discharge from ED.   -Lasix changed to as needed.  Recommend monitoring daily weights only taking Lasix if gains more than 3 pounds 1 day or 5 pounds in 1 week.  Also added potassium chloride 20 mill equivalents daily  -Recommend Zio patch x 7 days to quantify PVC burden   Chronic systolic heart failure: Last echo 03/2022 showed EF 40 to 45%.  Appears euvolemic.  Will update echocardiogram.  Lasix changed to as needed as above.  Continue valsartan 320 mg daily add beta-blocker***  Hypertension: On valsartan 320 mg daily and HCTZ 25 mg daily  Hyperlipidemia: On rosuvastatin 20 mg daily   RTC in ***  Medication Adjustments/Labs and Tests Ordered: Current medicines are reviewed at length with the patient today.  Concerns regarding medicines are outlined  above.  No orders of the defined types were placed in this encounter.  No orders of the defined types were placed in this encounter.   There are no Patient Instructions on file for this visit.   Signed, Little Ishikawa, MD  11/13/2023 3:02 PM    [facility]

## 2023-11-14 ENCOUNTER — Telehealth: Payer: Self-pay | Admitting: Family Medicine

## 2023-11-14 NOTE — Telephone Encounter (Signed)
Pt was seen in ED for chest pain on 11/12/23. I scheduled him a hospital f/up with Dr Veto Kemps for 11/15/23.

## 2023-11-15 ENCOUNTER — Ambulatory Visit: Payer: Medicare Other | Admitting: Cardiology

## 2023-11-15 ENCOUNTER — Ambulatory Visit (INDEPENDENT_AMBULATORY_CARE_PROVIDER_SITE_OTHER): Payer: Medicare Other | Admitting: Family Medicine

## 2023-11-15 ENCOUNTER — Encounter: Payer: Self-pay | Admitting: Family Medicine

## 2023-11-15 VITALS — BP 120/74 | HR 72 | Temp 98.2°F | Ht 64.0 in | Wt 161.6 lb

## 2023-11-15 DIAGNOSIS — J209 Acute bronchitis, unspecified: Secondary | ICD-10-CM

## 2023-11-15 MED ORDER — HYDROCOD POLI-CHLORPHE POLI ER 10-8 MG/5ML PO SUER: 5.0000 mL | Freq: Two times a day (BID) | ORAL | 0 refills | Status: DC | PRN

## 2023-11-15 MED ORDER — DOXYCYCLINE HYCLATE 100 MG PO TABS: 100.0000 mg | ORAL_TABLET | Freq: Two times a day (BID) | ORAL | 0 refills | Status: DC

## 2023-11-15 NOTE — Assessment & Plan Note (Signed)
I reviewed clinic notes from 11/22, 11/27, and 12/9 and the notes from the recent ED visit, including chest x-ray reports and lab tests. Mr. Mcclay has been treated with a course of azithromycin and prednisone. His cardiac work-up has shown no evidence of decompensated heart failure. He has no evidence of pneumonia. His symptoms are most consistent with bronchitis. I discussed with him that 25% of patient with bronchitis will cough for a month. I will try a course of doxycycline, though I am not convinced that this will make a difference. I will renew his cough syrup and plan to reassess him in 1 week.

## 2023-11-15 NOTE — Progress Notes (Signed)
[facility] [address] [place] [address] Dept: [phone] Dept Fax: [phone]  Office Visit  Subjective:    [name], male    DOB: Dec 20, 1940, 82 y.o..   MRN: [id number]  Chief Complaint  Patient presents with   Hospitalization Follow-up    Hospital f/u for chest pains 11/10/23.   C/o having cough   History of Present Illness:  Patient is in today for follow-up form a recent ED visit. David Goodman was seen at [facility] on 11/10/2023 with chest pain that has been associated with a respiratory illness. He has been seen multiple times in the clinic since 11/22. He notes that the medicines have not helped so far. He continues to have coughing spells esp. at night, which is significantly interrupting his sleep.  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 09/09/2022   Gait instability 09/09/2022   Acute right-sided low back pain without sciatica 09/03/2022   Nonallergic rhinitis 08/04/2022   History of colonic polyps 07/22/2022   Gastro-esophageal reflux disease with esophagitis 07/22/2022   Gastric polyp 07/22/2022   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   GIB (gastrointestinal bleeding) 04/11/2022   OSA on CPAP 12/13/2021   Vivid dream 09/21/2021   Excessive daytime sleepiness 09/21/2021   Snoring 09/21/2021   Coronary  artery disease involving native coronary artery of native heart 09/21/2021   SOBOE (shortness of breath on exertion) 09/21/2021   HFrEF (heart failure with reduced ejection fraction) (HCC)    Bell's palsy, left 06/05/2021   Cerebrovascular disease 06/05/2021   Hiatal hernia 05/29/2021   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   Erectile dysfunction due to arterial insufficiency 02/27/2021   Hemorrhoids 07/17/2020   Syncope 11/15/2018   Diverticulitis 05/25/2018   Osteoarthritis of right knee 12/13/2017   OA (osteoarthritis) of knee 12/13/2017   Testosterone deficiency 03/22/2017   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/27/2016   BPH (benign prostatic hyperplasia) 06/18/2013   HLD (hyperlipidemia) 07/13/2007   Essential hypertension 07/13/2007   Allergic rhinitis 07/13/2007   Peptic ulcer disease 07/13/2007   Chest pain of uncertain etiology 07/13/2007   History of nephrolithiasis 07/13/2007   Past Surgical History:  Procedure Laterality Date   BROW LIFT Bilateral 11/10/2020   Procedure: BLEPHAROPLASTY;  Surgeon: Allena Napoleon, MD;  Location: [facility];  Service: Plastics;  Laterality: Bilateral;  1 hour   COLONOSCOPY N/A 04/12/2022   Procedure: COLONOSCOPY;  Surgeon: Willis Modena, MD;  Location: [facility];  Service: Gastroenterology;  Laterality: N/A;   COLONOSCOPY WITH PROPOFOL N/A 02/17/2019   Procedure: COLONOSCOPY WITH PROPOFOL;  Surgeon: [name], MD;  Location: [facility];  Service: Gastroenterology;  Laterality: N/A;   CORONARY STENT INTERVENTION N/A 08/25/2021   Procedure: CORONARY STENT INTERVENTION;  Surgeon: Corky Crafts, MD;  Location: [facility] INVASIVE CV LAB;  Service: Cardiovascular;  Laterality: N/A;   CORONARY ULTRASOUND/IVUS N/A 08/25/2021  Procedure: Intravascular Ultrasound/IVUS;  Surgeon: Corky Crafts, MD;  Location: [facility] INVASIVE CV LAB;  Service: Cardiovascular;  Laterality:  N/A;   EYE SURGERY     HYDROCELE EXCISION Right 05/15/2003   IR ANGIOGRAM VISCERAL SELECTIVE  02/16/2019   IR ANGIOGRAM VISCERAL SELECTIVE  02/16/2019   IR ANGIOGRAM VISCERAL SELECTIVE  02/16/2019   IR US GUIDE VASC ACCESS RIGHT  02/16/2019   LEFT HEART CATH AND CORONARY ANGIOGRAPHY N/A 07/28/2021   Procedure: LEFT HEART CATH AND CORONARY ANGIOGRAPHY;  Surgeon: Corky Crafts, MD;  Location: [facility] INVASIVE CV LAB;  Service: Cardiovascular;  Laterality: N/A;   LEFT HEART CATH AND CORONARY ANGIOGRAPHY N/A 08/25/2021   Procedure: LEFT HEART CATH AND CORONARY ANGIOGRAPHY;  Surgeon: Corky Crafts, MD;  Location: [facility] INVASIVE CV LAB;  Service: Cardiovascular;  Laterality: N/A;   PERIPHERAL INTRAVASCULAR LITHOTRIPSY  08/25/2021   Procedure: INTRAVASCULAR LITHOTRIPSY;  Surgeon: Corky Crafts, MD;  Location: [facility] INVASIVE CV LAB;  Service: Cardiovascular;;   ROTATOR CUFF REPAIR     SPERMATOCELECTOMY Right 05/15/2003   TOTAL KNEE ARTHROPLASTY Left 07/11/2023   Procedure: TOTAL KNEE ARTHROPLASTY;  Surgeon: Ollen Gross, MD;  Location: [facility];  Service: Orthopedics;  Laterality: Left;   Family History  Problem Relation Age of Onset   Cancer Brother    Outpatient Medications Prior to Visit  Medication Sig Dispense Refill   albuterol (VENTOLIN HFA) 108 (90 Base) MCG/ACT inhaler Inhale 2 puffs into the lungs every 6 (six) hours as needed. 8 g 0   Ascorbic Acid (VITAMIN C) 1000 MG tablet Take 1,000 mg by mouth 2 (two) times a week.     Cyanocobalamin (B-12 PO) Take 1 tablet by mouth daily.     dextromethorphan-guaiFENesin (MUCINEX DM) 30-600 MG 12hr tablet Take 1 tablet by mouth 2 (two) times daily as needed for cough.     docusate sodium (COLACE) 250 MG capsule Take 250 mg by mouth daily.     furosemide (LASIX) 20 MG tablet Take 1 tablet (20 mg total) by mouth daily. 30 tablet 0   hydrochlorothiazide (HYDRODIURIL) 25 MG tablet TAKE 1 TABLET BY MOUTH DAILY 100 tablet 2   latanoprost  (XALATAN) 0.005 % ophthalmic solution Place 1 drop into both eyes at bedtime. 7.5 mL 1   MAGNESIUM PO Take 400 mg by mouth at bedtime.     methocarbamol (ROBAXIN) 500 MG tablet Take 1 tablet (500 mg total) by mouth every 6 (six) hours as needed for muscle spasms. 40 tablet 0   nitroGLYCERIN (NITROSTAT) 0.4 MG SL tablet Place 1 tablet (0.4 mg total) under the tongue every 5 (five) minutes as needed for chest pain. 25 tablet 3   ondansetron (ZOFRAN) 4 MG tablet Take 1 tablet (4 mg total) by mouth every 6 (six) hours as needed for nausea. (Patient taking differently: Take 4 mg by mouth as needed for nausea or vomiting.) 20 tablet 0   pantoprazole (PROTONIX) 40 MG tablet Take 1 tablet (40 mg total) by mouth daily. 90 tablet 2   Polyvinyl Alcohol-Povidone (REFRESH OP) Place 1 drop into the left eye 2 (two) times daily as needed (dryness).     potassium chloride 20 MEQ TBCR Take 1 tablet/day for 20-day 20 tablet 0   psyllium (METAMUCIL) 58.6 % powder Take 1 packet by mouth daily.     rosuvastatin (CRESTOR) 20 MG tablet Take 1 tablet by mouth once daily 90 tablet 3   tadalafil (CIALIS) 20 MG tablet Take 1 tablet (20 mg total)  by mouth daily as needed for erectile dysfunction. 30 tablet 2   tamsulosin (FLOMAX) 0.4 MG CAPS capsule TAKE 1 CAPSULE BY MOUTH DAILY 100 capsule 2   valsartan (DIOVAN) 320 MG tablet Take 1 tablet by mouth once daily 90 tablet 1   zinc gluconate 50 MG tablet Take 50 mg by mouth daily.     traZODone (DESYREL) 50 MG tablet Take 25-50 mg by mouth at bedtime as needed. (Patient not taking: Reported on 11/15/2023)     benzonatate (TESSALON) 100 MG capsule Take 1-2 capsules (100-200 mg total) by mouth 3 (three) times daily as needed. 21 capsule 0   chlorpheniramine-HYDROcodone (TUSSIONEX) 10-8 MG/5ML Take 5 mLs by mouth every 12 (twelve) hours as needed for cough. (Patient not taking: Reported on 11/15/2023) 70 mL 0   methylPREDNISolone (MEDROL DOSEPAK) 4 MG TBPK tablet Take as directed on  package 21 tablet 0   No facility-administered medications prior to visit.   No Known Allergies   Objective:   Today's Vitals   11/15/23 1041  BP: 120/74  Pulse: 72  Temp: 98.2 F (36.8 C)  TempSrc: Temporal  SpO2: 96%  Weight: 161 lb 9.6 oz (73.3 kg)  Height: 5\' 4"  (1.626 m)   Body mass index is 27.74 kg/m.   General: Well developed, well nourished. No acute distress. HEENT: Normocephalic, non-traumatic. Eyes clear. Mucous membranes moist. Oropharynx clear. Good   dentition. Lungs: Mild basilar mucousy breath sounds. No wheezing, or rales noted. CV: RRR without murmurs or rubs. Pulses 2+ bilaterally. Psych: Alert and oriented. Normal mood and affect.  Health Maintenance Due  Topic Date Due   Medicare Annual Wellness (AWV)  01/11/2024     Assessment & Plan:   Problem List Items Addressed This Visit       Respiratory   Acute bronchitis - Primary   I reviewed clinic notes from 11/22, 11/27, and 12/9 and the notes from the recent ED visit, including chest x-ray reports and lab tests. Mr. Brimmer has been treated with a course of azithromycin and prednisone. His cardiac work-up has shown no evidence of decompensated heart failure. He has no evidence of pneumonia. His symptoms are most consistent with bronchitis. I discussed with him that 25% of patient with bronchitis will cough for a month. I will try a course of doxycycline, though I am not convinced that this will make a difference. I will renew his cough syrup and plan to reassess him in 1 week.      Relevant Medications   doxycycline (VIBRA-TABS) 100 MG tablet   chlorpheniramine-HYDROcodone (TUSSIONEX) 10-8 MG/5ML    Return in about 1 week (around 11/22/2023) for Reassessment.   Loyola Mast, MD

## 2023-11-18 DIAGNOSIS — Z4789 Encounter for other orthopedic aftercare: Secondary | ICD-10-CM | POA: Diagnosis not present

## 2023-11-18 DIAGNOSIS — Z96652 Presence of left artificial knee joint: Secondary | ICD-10-CM | POA: Diagnosis not present

## 2023-11-18 DIAGNOSIS — M1711 Unilateral primary osteoarthritis, right knee: Secondary | ICD-10-CM | POA: Diagnosis not present

## 2023-11-21 ENCOUNTER — Encounter: Payer: Self-pay | Admitting: Family Medicine

## 2023-11-21 ENCOUNTER — Ambulatory Visit: Payer: Medicare Other | Admitting: Family Medicine

## 2023-11-21 VITALS — BP 130/66 | HR 71 | Temp 98.9°F | Ht 64.0 in

## 2023-11-21 DIAGNOSIS — J209 Acute bronchitis, unspecified: Secondary | ICD-10-CM | POA: Diagnosis not present

## 2023-11-21 MED ORDER — HYDROCOD POLI-CHLORPHE POLI ER 10-8 MG/5ML PO SUER: 5.0000 mL | Freq: Two times a day (BID) | ORAL | 0 refills | Status: DC | PRN

## 2023-11-21 NOTE — Progress Notes (Signed)
[facility] [address] [place] [address] Dept: [phone] Dept Fax: [phone]  Office Visit  Subjective:    [name], male    DOB: May 08, 1941, 82 y.o..   MRN: [id number]  Chief Complaint  Patient presents with   Follow-up    1 week f/u Bronchitis.    Has been having stomach pain and is out of cough medication.    History of Present Illness:  Patient is in today for reassessment of his bronchitis. He had been seen multiple times since 11/22. I saw him on 12/17. He had mucousy breath sounds in both bases. I did treat him with a course of doxycycline. He notes his cough is improved, though not completely resolved. He has had some dyspepsia from the antibiotic, but feels he can continue with the two doses he has left.   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 09/09/2022   Gait instability 09/09/2022   Acute right-sided low back pain without sciatica 09/03/2022   Nonallergic rhinitis 08/04/2022   History of colonic polyps 07/22/2022   Gastro-esophageal reflux disease with esophagitis 07/22/2022   Gastric polyp 07/22/2022   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   GIB (gastrointestinal bleeding) 04/11/2022   OSA on CPAP 12/13/2021   Vivid dream 09/21/2021   Excessive daytime sleepiness 09/21/2021   Snoring 09/21/2021    Coronary artery disease involving native coronary artery of native heart 09/21/2021   SOBOE (shortness of breath on exertion) 09/21/2021   HFrEF (heart failure with reduced ejection fraction) (HCC)    Bell's palsy, left 06/05/2021   Cerebrovascular disease 06/05/2021   Hiatal hernia 05/29/2021   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   Erectile dysfunction due to arterial insufficiency 02/27/2021   Acute bronchitis 12/11/2020   Hemorrhoids 07/17/2020   Syncope 11/15/2018   Diverticulitis 05/25/2018   Osteoarthritis of right knee 12/13/2017   OA (osteoarthritis) of knee 12/13/2017   Testosterone deficiency 03/22/2017   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/27/2016   BPH (benign prostatic hyperplasia) 06/18/2013   HLD (hyperlipidemia) 07/13/2007   Essential hypertension 07/13/2007   Allergic rhinitis 07/13/2007   Peptic ulcer disease 07/13/2007   Chest pain of uncertain etiology 07/13/2007   History of nephrolithiasis 07/13/2007   Past Surgical History:  Procedure Laterality Date   BROW LIFT Bilateral 11/10/2020   Procedure: BLEPHAROPLASTY;  Surgeon: Allena Napoleon, MD;  Location: [facility];  Service: Plastics;  Laterality: Bilateral;  1 hour   COLONOSCOPY N/A 04/12/2022   Procedure: COLONOSCOPY;  Surgeon: Willis Modena, MD;  Location: [facility];  Service: Gastroenterology;  Laterality: N/A;   COLONOSCOPY WITH PROPOFOL N/A 02/17/2019   Procedure: COLONOSCOPY WITH PROPOFOL;  Surgeon: [name], MD;  Location: [facility];  Service: Gastroenterology;  Laterality: N/A;   CORONARY STENT INTERVENTION N/A 08/25/2021   Procedure: CORONARY STENT INTERVENTION;  Surgeon: Corky Crafts, MD;  Location:  [facility] INVASIVE CV LAB;  Service: Cardiovascular;  Laterality: N/A;   CORONARY ULTRASOUND/IVUS N/A 08/25/2021   Procedure: Intravascular Ultrasound/IVUS;  Surgeon: Corky Crafts, MD;  Location: [facility] INVASIVE CV LAB;   Service: Cardiovascular;  Laterality: N/A;   EYE SURGERY     HYDROCELE EXCISION Right 05/15/2003   IR ANGIOGRAM VISCERAL SELECTIVE  02/16/2019   IR ANGIOGRAM VISCERAL SELECTIVE  02/16/2019   IR ANGIOGRAM VISCERAL SELECTIVE  02/16/2019   IR US GUIDE VASC ACCESS RIGHT  02/16/2019   LEFT HEART CATH AND CORONARY ANGIOGRAPHY N/A 07/28/2021   Procedure: LEFT HEART CATH AND CORONARY ANGIOGRAPHY;  Surgeon: Corky Crafts, MD;  Location: [facility] INVASIVE CV LAB;  Service: Cardiovascular;  Laterality: N/A;   LEFT HEART CATH AND CORONARY ANGIOGRAPHY N/A 08/25/2021   Procedure: LEFT HEART CATH AND CORONARY ANGIOGRAPHY;  Surgeon: Corky Crafts, MD;  Location: [facility] INVASIVE CV LAB;  Service: Cardiovascular;  Laterality: N/A;   PERIPHERAL INTRAVASCULAR LITHOTRIPSY  08/25/2021   Procedure: INTRAVASCULAR LITHOTRIPSY;  Surgeon: Corky Crafts, MD;  Location: [facility] INVASIVE CV LAB;  Service: Cardiovascular;;   ROTATOR CUFF REPAIR     SPERMATOCELECTOMY Right 05/15/2003   TOTAL KNEE ARTHROPLASTY Left 07/11/2023   Procedure: TOTAL KNEE ARTHROPLASTY;  Surgeon: Ollen Gross, MD;  Location: [facility];  Service: Orthopedics;  Laterality: Left;   Family History  Problem Relation Age of Onset   Cancer Brother    Outpatient Medications Prior to Visit  Medication Sig Dispense Refill   albuterol (VENTOLIN HFA) 108 (90 Base) MCG/ACT inhaler Inhale 2 puffs into the lungs every 6 (six) hours as needed. 8 g 0   Ascorbic Acid (VITAMIN C) 1000 MG tablet Take 1,000 mg by mouth 2 (two) times a week.     Cyanocobalamin (B-12 PO) Take 1 tablet by mouth daily.     dextromethorphan-guaiFENesin (MUCINEX DM) 30-600 MG 12hr tablet Take 1 tablet by mouth 2 (two) times daily as needed for cough.     docusate sodium (COLACE) 250 MG capsule Take 250 mg by mouth daily.     doxycycline (VIBRA-TABS) 100 MG tablet Take 1 tablet (100 mg total) by mouth 2 (two) times daily. 14 tablet 0   furosemide (LASIX) 20 MG tablet Take 1 tablet  (20 mg total) by mouth daily. 30 tablet 0   hydrochlorothiazide (HYDRODIURIL) 25 MG tablet TAKE 1 TABLET BY MOUTH DAILY 100 tablet 2   latanoprost (XALATAN) 0.005 % ophthalmic solution Place 1 drop into both eyes at bedtime. 7.5 mL 1   MAGNESIUM PO Take 400 mg by mouth at bedtime.     methocarbamol (ROBAXIN) 500 MG tablet Take 1 tablet (500 mg total) by mouth every 6 (six) hours as needed for muscle spasms. 40 tablet 0   nitroGLYCERIN (NITROSTAT) 0.4 MG SL tablet Place 1 tablet (0.4 mg total) under the tongue every 5 (five) minutes as needed for chest pain. 25 tablet 3   ondansetron (ZOFRAN) 4 MG tablet Take 1 tablet (4 mg total) by mouth every 6 (six) hours as needed for nausea. (Patient taking differently: Take 4 mg by mouth as needed for nausea or vomiting.) 20 tablet 0   pantoprazole (PROTONIX) 40 MG tablet Take 1 tablet (40 mg total) by mouth daily. 90 tablet 2   Polyvinyl Alcohol-Povidone (REFRESH OP) Place 1 drop into the left eye 2 (two) times daily as needed (dryness).     potassium chloride 20 MEQ TBCR Take 1 tablet/day for 20-day 20 tablet 0   psyllium (METAMUCIL) 58.6 %  powder Take 1 packet by mouth daily.     rosuvastatin (CRESTOR) 20 MG tablet Take 1 tablet by mouth once daily 90 tablet 3   tadalafil (CIALIS) 20 MG tablet Take 1 tablet (20 mg total) by mouth daily as needed for erectile dysfunction. 30 tablet 2   tamsulosin (FLOMAX) 0.4 MG CAPS capsule TAKE 1 CAPSULE BY MOUTH DAILY 100 capsule 2   traZODone (DESYREL) 50 MG tablet Take 25-50 mg by mouth at bedtime as needed.     valsartan (DIOVAN) 320 MG tablet Take 1 tablet by mouth once daily 90 tablet 1   zinc gluconate 50 MG tablet Take 50 mg by mouth daily.     chlorpheniramine-HYDROcodone (TUSSIONEX) 10-8 MG/5ML Take 5 mLs by mouth every 12 (twelve) hours as needed for cough. 115 mL 0   No facility-administered medications prior to visit.   No Known Allergies   Objective:   Today's Vitals   11/21/23 1507  BP: 130/66   Pulse: 71  Temp: 98.9 F (37.2 C)  TempSrc: Temporal  SpO2: 97%  Height: 5\' 4"  (1.626 m)   Body mass index is 27.74 kg/m.   General: Well developed, well nourished. No acute distress. Lungs: Clear to auscultation bilaterally. No wheezing, rales or rhonchi. Psych: Alert and oriented. Normal mood and affect.  Health Maintenance Due  Topic Date Due   Medicare Annual Wellness (AWV)  01/11/2024     Assessment & Plan:   Problem List Items Addressed This Visit       Respiratory   Acute bronchitis - Primary   Improving. I will renew his cough syrup and follow-up as needed.      Relevant Medications   chlorpheniramine-HYDROcodone (TUSSIONEX) 10-8 MG/5ML    Return in about 3 months (around 02/19/2024) for Reassessment.   Loyola Mast, MD

## 2023-11-21 NOTE — Assessment & Plan Note (Signed)
Improving. I will renew his cough syrup and follow-up as needed.

## 2023-11-25 ENCOUNTER — Telehealth: Payer: Self-pay

## 2023-11-25 NOTE — Telephone Encounter (Signed)
Copied from CRM [id number]. Topic: Clinical - Medication Question >> Nov 25, 2023 10:52 AM David Goodman wrote: Reason for CRM: Patient Insurance called in regarding medication prescriptions that were to be sent following his doctor visit, Would like to know when he will be able to pick those up from the pharmacy I did let the patient kow about the 3 business day process. Insurance stated that for the nebulizer that he will need a prior authorization for that as well. Patient is requesting a callback for more information   Called patient and advised to keep his appt scheduled on 11/29/23 to discuss possibly getting a Nebulizer to help relieve the cough and help him sleep at night.   Dm/cma

## 2023-11-28 ENCOUNTER — Encounter: Payer: Self-pay | Admitting: Internal Medicine

## 2023-11-28 ENCOUNTER — Ambulatory Visit (INDEPENDENT_AMBULATORY_CARE_PROVIDER_SITE_OTHER): Payer: Medicare Other | Admitting: Internal Medicine

## 2023-11-28 ENCOUNTER — Telehealth: Payer: Self-pay

## 2023-11-28 VITALS — BP 122/68 | HR 74 | Temp 97.7°F | Ht 64.0 in | Wt 159.2 lb

## 2023-11-28 DIAGNOSIS — J209 Acute bronchitis, unspecified: Secondary | ICD-10-CM | POA: Diagnosis not present

## 2023-11-28 MED ORDER — BUDESONIDE-FORMOTEROL FUMARATE 160-4.5 MCG/ACT IN AERO: 2.0000 | INHALATION_SPRAY | Freq: Two times a day (BID) | RESPIRATORY_TRACT | 2 refills | Status: DC

## 2023-11-28 MED ORDER — IPRATROPIUM-ALBUTEROL 0.5-2.5 (3) MG/3ML IN SOLN
3.0000 mL | Freq: Once | RESPIRATORY_TRACT | Status: AC
  Administered 2023-11-28: 3 mL via RESPIRATORY_TRACT

## 2023-11-28 MED ORDER — METHYLPREDNISOLONE SODIUM SUCC 125 MG IJ SOLR
125.0000 mg | Freq: Once | INTRAMUSCULAR | Status: AC
  Administered 2023-11-28: 125 mg via INTRAMUSCULAR

## 2023-11-28 MED ORDER — ALBUTEROL SULFATE (2.5 MG/3ML) 0.083% IN NEBU: 2.5000 mg | INHALATION_SOLUTION | Freq: Four times a day (QID) | RESPIRATORY_TRACT | 1 refills | Status: DC | PRN

## 2023-11-28 MED ORDER — MISC. DEVICES MISC: 0 refills | Status: DC

## 2023-11-28 NOTE — Progress Notes (Signed)
[facility] [address] [place] [address] Dept: [phone] Dept Fax: [phone]  Acute Care Office Visit  Subjective:   David Goodman 01-07-1941 11/28/2023  Chief Complaint  Patient presents with   Cough    Coughing for over a month     HPI: Discussed the use of AI scribe software for clinical note transcription with the patient, who gave verbal consent to proceed.  History of Present Illness   The patient, with a history of bronchitis, presents with ongoing symptoms despite previous treatments. Patient has been seen for same chief complaint on 11/22, 12/17, 12/23, 12/27. He describes the cough as severe enough to disrupt sleep and produce phlegm that is 'sticky, like glue.'  Patient was prescribed a Z-Pak, cough syrup, steroid pack, and albuterol inhaler with little to no relief.  Patient was then prescribed doxycycline, which he reports he did notice some relief and change in his sputum color from yellow to clear.  However, his cough continues to persist.    The following portions of the patient's history were reviewed and updated as appropriate: past medical history, past surgical history, family history, social history, allergies, medications, and problem list.   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 09/09/2022   Gait instability 09/09/2022   Acute  right-sided low back pain without sciatica 09/03/2022   Nonallergic rhinitis 08/04/2022   History of colonic polyps 07/22/2022   Gastro-esophageal reflux disease with esophagitis 07/22/2022   Gastric polyp 07/22/2022   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   GIB (gastrointestinal bleeding) 04/11/2022   OSA on CPAP 12/13/2021   Vivid dream 09/21/2021   Excessive daytime sleepiness 09/21/2021   Snoring 09/21/2021   Coronary artery disease involving native coronary artery of native heart 09/21/2021   SOBOE (shortness of breath on exertion) 09/21/2021   HFrEF (heart failure with reduced ejection fraction) (HCC)    Bell's palsy, left 06/05/2021   Cerebrovascular disease 06/05/2021   Hiatal hernia 05/29/2021   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   Erectile dysfunction due to arterial insufficiency 02/27/2021   Acute bronchitis 12/11/2020   Hemorrhoids 07/17/2020   Syncope 11/15/2018   Diverticulitis 05/25/2018   Osteoarthritis of right knee 12/13/2017   OA (osteoarthritis) of knee 12/13/2017   Testosterone deficiency 03/22/2017   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/27/2016   BPH (benign prostatic hyperplasia) 06/18/2013   HLD (hyperlipidemia) 07/13/2007   Essential hypertension 07/13/2007   Allergic rhinitis 07/13/2007   Peptic ulcer disease 07/13/2007   Chest pain of uncertain etiology 07/13/2007   History of nephrolithiasis 07/13/2007   Past Medical History:  Diagnosis Date   ALLERGIC RHINITIS 07/13/2007   Aorta dilated on Echo; Normal sized Aorta on CT    Chest CT 1/23: No thoracic aortic aneurysm; coronary calcifications; aortic atherosclerosis; improved aeration of lungs with persistent minimal reticular opacities   Arthritis    Blood transfusion without reported diagnosis  CAD (coronary artery disease)    CHF (congestive heart failure) (HCC)    Clotting disorder (HCC)    Diverticulitis    GERD (gastroesophageal  reflux disease)    GI bleed    Glaucoma    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)    HYPERLIPIDEMIA 07/13/2007   HYPERTENSION 07/13/2007   Impaired glucose tolerance 09/27/2016   Iron deficiency anemia due to chronic blood loss 02/27/2021   NEPHROLITHIASIS, 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   NSVT (nonsustained ventricular tachycardia) (HCC)    PEPTIC ULCER DISEASE 07/13/2007   PVC's (premature ventricular contractions)    Sleep apnea    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   Past Surgical History:  Procedure Laterality Date   BROW LIFT Bilateral 11/10/2020   Procedure: BLEPHAROPLASTY;  Surgeon: Allena Napoleon, MD;  Location: [facility];  Service: Plastics;  Laterality: Bilateral;  1 hour   COLONOSCOPY N/A 04/12/2022   Procedure: COLONOSCOPY;  Surgeon: Willis Modena, MD;  Location: [facility];  Service: Gastroenterology;  Laterality: N/A;   COLONOSCOPY WITH PROPOFOL N/A 02/17/2019   Procedure: COLONOSCOPY WITH PROPOFOL;  Surgeon: [name], MD;  Location: [facility];  Service: Gastroenterology;  Laterality: N/A;   CORONARY STENT INTERVENTION N/A 08/25/2021   Procedure: CORONARY STENT INTERVENTION;  Surgeon: Corky Crafts, MD;  Location: [facility] INVASIVE CV LAB;  Service: Cardiovascular;  Laterality: N/A;   CORONARY ULTRASOUND/IVUS N/A 08/25/2021   Procedure: Intravascular Ultrasound/IVUS;  Surgeon: Corky Crafts, MD;  Location: [facility] INVASIVE CV LAB;  Service: Cardiovascular;  Laterality: N/A;   EYE SURGERY     HYDROCELE EXCISION Right 05/15/2003   IR ANGIOGRAM VISCERAL SELECTIVE  02/16/2019   IR ANGIOGRAM VISCERAL SELECTIVE  02/16/2019   IR ANGIOGRAM VISCERAL SELECTIVE  02/16/2019   IR US GUIDE VASC ACCESS RIGHT  02/16/2019   LEFT HEART CATH AND CORONARY ANGIOGRAPHY N/A 07/28/2021   Procedure: LEFT HEART CATH AND CORONARY ANGIOGRAPHY;  Surgeon: Corky Crafts, MD;  Location: [facility] INVASIVE CV LAB;  Service: Cardiovascular;  Laterality:  N/A;   LEFT HEART CATH AND CORONARY ANGIOGRAPHY N/A 08/25/2021   Procedure: LEFT HEART CATH AND CORONARY ANGIOGRAPHY;  Surgeon: Corky Crafts, MD;  Location: [facility] INVASIVE CV LAB;  Service: Cardiovascular;  Laterality: N/A;   PERIPHERAL INTRAVASCULAR LITHOTRIPSY  08/25/2021   Procedure: INTRAVASCULAR LITHOTRIPSY;  Surgeon: Corky Crafts, MD;  Location: [facility] INVASIVE CV LAB;  Service: Cardiovascular;;   ROTATOR CUFF REPAIR     SPERMATOCELECTOMY Right 05/15/2003   TOTAL KNEE ARTHROPLASTY Left 07/11/2023   Procedure: TOTAL KNEE ARTHROPLASTY;  Surgeon: Ollen Gross, MD;  Location: [facility];  Service: Orthopedics;  Laterality: Left;   Family History  Problem Relation Age of Onset   Cancer Brother     Current Outpatient Medications:    albuterol (PROVENTIL) (2.5 MG/3ML) 0.083% nebulizer solution, Take 3 mLs (2.5 mg total) by nebulization every 6 (six) hours as needed for wheezing or shortness of breath., Disp: 150 mL, Rfl: 1   Ascorbic Acid (VITAMIN C) 1000 MG tablet, Take 1,000 mg by mouth 2 (two) times a week., Disp: , Rfl:    budesonide-formoterol (SYMBICORT) 160-4.5 MCG/ACT inhaler, Inhale 2 puffs into the lungs 2 (two) times daily., Disp: 1 each, Rfl: 2   chlorpheniramine-HYDROcodone (TUSSIONEX) 10-8 MG/5ML, Take 5 mLs by mouth every 12 (twelve) hours as needed for cough., Disp: 115 mL, Rfl: 0   Cyanocobalamin (B-12 PO), Take 1 tablet by mouth daily., Disp: , Rfl:    dextromethorphan-guaiFENesin (MUCINEX DM) 30-600  MG 12hr tablet, Take 1 tablet by mouth 2 (two) times daily as needed for cough., Disp: , Rfl:    docusate sodium (COLACE) 250 MG capsule, Take 250 mg by mouth daily., Disp: , Rfl:    doxycycline (VIBRA-TABS) 100 MG tablet, Take 1 tablet (100 mg total) by mouth 2 (two) times daily., Disp: 14 tablet, Rfl: 0   furosemide (LASIX) 20 MG tablet, Take 1 tablet (20 mg total) by mouth daily., Disp: 30 tablet, Rfl: 0   hydrochlorothiazide (HYDRODIURIL) 25 MG tablet, TAKE 1 TABLET  BY MOUTH DAILY, Disp: 100 tablet, Rfl: 2   latanoprost (XALATAN) 0.005 % ophthalmic solution, Place 1 drop into both eyes at bedtime., Disp: 7.5 mL, Rfl: 1   MAGNESIUM PO, Take 400 mg by mouth at bedtime., Disp: , Rfl:    methocarbamol (ROBAXIN) 500 MG tablet, Take 1 tablet (500 mg total) by mouth every 6 (six) hours as needed for muscle spasms., Disp: 40 tablet, Rfl: 0   Misc. Devices MISC, Nebulizer machine with tubing, Disp: 1 each, Rfl: 0   nitroGLYCERIN (NITROSTAT) 0.4 MG SL tablet, Place 1 tablet (0.4 mg total) under the tongue every 5 (five) minutes as needed for chest pain., Disp: 25 tablet, Rfl: 3   ondansetron (ZOFRAN) 4 MG tablet, Take 1 tablet (4 mg total) by mouth every 6 (six) hours as needed for nausea. (Patient taking differently: Take 4 mg by mouth as needed for nausea or vomiting.), Disp: 20 tablet, Rfl: 0   pantoprazole (PROTONIX) 40 MG tablet, Take 1 tablet (40 mg total) by mouth daily., Disp: 90 tablet, Rfl: 2   Polyvinyl Alcohol-Povidone (REFRESH OP), Place 1 drop into the left eye 2 (two) times daily as needed (dryness)., Disp: , Rfl:    potassium chloride 20 MEQ TBCR, Take 1 tablet/day for 20-day, Disp: 20 tablet, Rfl: 0   psyllium (METAMUCIL) 58.6 % powder, Take 1 packet by mouth daily., Disp: , Rfl:    rosuvastatin (CRESTOR) 20 MG tablet, Take 1 tablet by mouth once daily, Disp: 90 tablet, Rfl: 3   tadalafil (CIALIS) 20 MG tablet, Take 1 tablet (20 mg total) by mouth daily as needed for erectile dysfunction., Disp: 30 tablet, Rfl: 2   tamsulosin (FLOMAX) 0.4 MG CAPS capsule, TAKE 1 CAPSULE BY MOUTH DAILY, Disp: 100 capsule, Rfl: 2   traZODone (DESYREL) 50 MG tablet, Take 25-50 mg by mouth at bedtime as needed., Disp: , Rfl:    valsartan (DIOVAN) 320 MG tablet, Take 1 tablet by mouth once daily, Disp: 90 tablet, Rfl: 1   zinc gluconate 50 MG tablet, Take 50 mg by mouth daily., Disp: , Rfl:  No Known Allergies   ROS: A complete ROS was performed with pertinent  positives/negatives noted in the HPI. The remainder of the ROS are negative.    Objective:   Today's Vitals   11/28/23 1035  BP: 122/68  Pulse: 74  Temp: 97.7 F (36.5 C)  TempSrc: Temporal  SpO2: 97%  Weight: 159 lb 3.2 oz (72.2 kg)  Height: 5\' 4"  (1.626 m)    GENERAL: Well-appearing, in NAD. Well nourished.  SKIN: Pink, warm and dry.   NECK: Trachea midline. Full ROM w/o pain or tenderness. No lymphadenopathy.  RESPIRATORY: Chest wall symmetrical. Respirations even and non-labored. Diminished throughout. Scattered inspiratory and expiratory wheezes throughout lung fields. (+)cough CARDIAC: S1, S2 present, regular rate and rhythm. Peripheral pulses 2+ bilaterally.  EXTREMITIES: Without clubbing, cyanosis, or edema.  NEUROLOGIC: Steady, even gait.  PSYCH/MENTAL STATUS: Alert, oriented  x 3. Cooperative, appropriate mood and affect.   Post nebulizer treatment in office: Aeration improved. scattered expiratory wheezes.  No results found for any visits on 11/28/23.    Assessment & Plan:  Assessment and Plan    Acute Bronchitis Persistent despite recent courses of azithromycin and doxycycline. Patient reports phlegm is now clear, not yellow. Auscultation reveals wheezing.  -Administer DuoNeb nebulizer treatment in office today, much improved.  - Administer Solumedrol 125mg  IM in office -Prescribe home nebulizer machine and albuterol nebs for home use every 6 hrs PRN wheezing/SHOB - Prescribe Symbicort 2 puffs BID - continue cough syrup as prescribed by PCP.       Meds ordered this encounter  Medications   ipratropium-albuterol (DUONEB) 0.5-2.5 (3) MG/3ML nebulizer solution 3 mL   methylPREDNISolone sodium succinate (SOLU-MEDROL) 125 mg/2 mL injection 125 mg   budesonide-formoterol (SYMBICORT) 160-4.5 MCG/ACT inhaler    Sig: Inhale 2 puffs into the lungs 2 (two) times daily.    Dispense:  1 each    Refill:  2    Supervising Provider:   Garnette Gunner [[id number]]    albuterol (PROVENTIL) (2.5 MG/3ML) 0.083% nebulizer solution    Sig: Take 3 mLs (2.5 mg total) by nebulization every 6 (six) hours as needed for wheezing or shortness of breath.    Dispense:  150 mL    Refill:  1    Supervising Provider:   Garnette Gunner [[id number]]   Misc. Devices MISC    Sig: Nebulizer machine with tubing    Dispense:  1 each    Refill:  0    Supervising Provider:   Garnette Gunner [id number]   No orders of the defined types were placed in this encounter.  Lab Orders  No laboratory test(s) ordered today   No images are attached to the encounter or orders placed in the encounter.  Return if symptoms worsen or fail to improve.   Salvatore Decent, FNP

## 2023-11-28 NOTE — Telephone Encounter (Signed)
Patient just had a question regarding using the Nebulizer medication.  Answered his questions and no further questions.  Dm/cma   Copied from CRM [id number]. Topic: Clinical - Medical Advice >> Nov 28, 2023  4:24 PM Elizebeth Brooking wrote: Reason for CRM: Patient called in wanting Morrie Sheldon or Nurse to call back regarding prescription

## 2023-11-29 ENCOUNTER — Ambulatory Visit: Payer: Medicare Other | Admitting: Family Medicine

## 2023-12-01 ENCOUNTER — Ambulatory Visit: Payer: Self-pay | Admitting: Family Medicine

## 2023-12-01 NOTE — Telephone Encounter (Signed)
  Chief Complaint: cough Symptoms: cough, abdominal pain, constipation Frequency: since 12/30 Pertinent Negatives: Patient denies fever, sob Disposition: [] ED /[] Urgent Care (no appt availability in office) / [] Appointment(In office/virtual)/ []  [facility]/ [x] Home Care/ [] Refused Recommended Disposition /[] [facility]/ [x]  Follow-up with PCP Additional Notes: Patient reports he was seen 12/30 in office for a cough and was prescribed medication at that time. Patient reports he feels the medication is not helping. Patient reports he is still experiencing cough and feels that the medication he was prescribed is upsetting his stomach and causing abdominal pain and constipation. Patient is requesting a different medication at this time.  This RN advised patient that she would forward request to appropriate person for follow-up. Patient advised to call back with worsening symptoms. Patient verbalized understanding.   Copied from CRM [id number]. Topic: Clinical - Medical Advice >> Dec 01, 2023  8:11 AM Thersia BROCKS wrote: Reason for CRM: Patient took the medicine that was prescribe and thinks its too strong, his cough is back and he is wanting to know if there is something else he can take or do he need a lower does of it requesting a callback Reason for Disposition  Cough  Answer Assessment - Initial Assessment Questions 1. ONSET: When did the cough begin?      12/30 2. SEVERITY: How bad is the cough today?      severe 3. SPUTUM: Describe the color of your sputum (none, dry cough; clear, white, yellow, green)     Dry cough 4. HEMOPTYSIS: Are you coughing up any blood? If so ask: How much? (flecks, streaks, tablespoons, etc.)     none 5. DIFFICULTY BREATHING: Are you having difficulty breathing? If Yes, ask: How bad is it? (e.g., mild, moderate, severe)    - MILD: No SOB at rest, mild SOB with walking, speaks normally in sentences, can lie down, no retractions, pulse  < 100.    - MODERATE: SOB at rest, SOB with minimal exertion and prefers to sit, cannot lie down flat, speaks in phrases, mild retractions, audible wheezing, pulse 100-120.    - SEVERE: Very SOB at rest, speaks in single words, struggling to breathe, sitting hunched forward, retractions, pulse > 120      none 6. FEVER: Do you have a fever? If Yes, ask: What is your temperature, how was it measured, and when did it start?     none 7. CARDIAC HISTORY: Do you have any history of heart disease? (e.g., heart attack, congestive heart failure)      CAD 8. LUNG HISTORY: Do you have any history of lung disease?  (e.g., pulmonary embolus, asthma, emphysema)     none 9. PE RISK FACTORS: Do you have a history of blood clots? (or: recent major surgery, recent prolonged travel, bedridden)     none 10. OTHER SYMPTOMS: Do you have any other symptoms? (e.g., runny nose, wheezing, chest pain)       Constipation, abdominal pain  Protocols used: Cough - Acute Non-Productive-A-AH

## 2023-12-01 NOTE — Telephone Encounter (Signed)
 Patient has been informed of message and will pick up over the counter medication from pharmacy

## 2023-12-01 NOTE — Telephone Encounter (Signed)
 Please notify patient it is common for cough to linger with bronchitis. Can be present for 1 month after onset of symptoms.

## 2023-12-01 NOTE — Telephone Encounter (Signed)
 The inhalers and nebulizers will not cause constipation. It is likely related to the codeine cough syrup. He can try OTC Coricidin HBP cough and congestion

## 2023-12-13 ENCOUNTER — Ambulatory Visit (INDEPENDENT_AMBULATORY_CARE_PROVIDER_SITE_OTHER): Payer: Medicare Other | Admitting: Family Medicine

## 2023-12-13 VITALS — BP 122/58 | HR 77 | Temp 97.8°F | Ht 64.0 in | Wt 158.6 lb

## 2023-12-13 DIAGNOSIS — G4709 Other insomnia: Secondary | ICD-10-CM

## 2023-12-13 DIAGNOSIS — R053 Chronic cough: Secondary | ICD-10-CM

## 2023-12-13 MED ORDER — HYDROCOD POLI-CHLORPHE POLI ER 10-8 MG/5ML PO SUER: 5.0000 mL | Freq: Two times a day (BID) | ORAL | 0 refills | Status: DC | PRN

## 2023-12-13 MED ORDER — ZOLPIDEM TARTRATE 5 MG PO TABS
5.0000 mg | ORAL_TABLET | Freq: Every evening | ORAL | 0 refills | Status: DC | PRN
Start: 2023-12-13 — End: 2024-01-12

## 2023-12-13 NOTE — Progress Notes (Signed)
 [facility] [address] [place] [address] Dept: [phone] Dept Fax: [phone]  Office Visit  Subjective:    [name], male    DOB: 1941-04-18, 83 y.o..   MRN: [id number]  Chief Complaint  Patient presents with   Cough    C/o still having cough.  Has been using nebulizer every 8 hours.   Also not sleeping well.  Used to take Ambien .    History of Present Illness:  Patient is in today with ongoing cough issues. David Goodman has been seen on 11/22, 12/17, 12/23, 12/27 and 12/30 with a cough felt to be related to acute bronchitis.   He describes the cough as severe enough to disrupt sleep and produce phlegm that is 'sticky, like glue.'  He has been treated with azithromycin , doxycycline , oral steroids, albuterol  inhaler, Symbicort , cetirizine , and cough syrup.  He did note some some relief after the doxycycline , which changed his sputum color from yellow to clear.  However, his cough continues to persist. He has found this prevents him from sleeping more than 45 min. at a time. He admits to use of [place] cough lozenges.   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 09/09/2022   Gait instability 09/09/2022   Acute right-sided low back pain without sciatica 09/03/2022   Nonallergic rhinitis 08/04/2022   History of colonic polyps  07/22/2022   Gastro-esophageal reflux disease with esophagitis 07/22/2022   Gastric polyp 07/22/2022   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   GIB (gastrointestinal bleeding) 04/11/2022   OSA on CPAP 12/13/2021   Vivid dream 09/21/2021   Excessive daytime sleepiness 09/21/2021   Snoring 09/21/2021   Coronary artery disease involving native coronary artery of native heart 09/21/2021   SOBOE (shortness of breath on exertion) 09/21/2021   HFrEF (heart failure with reduced ejection fraction) (HCC)    Bell's palsy, left 06/05/2021   Cerebrovascular disease 06/05/2021   Hiatal hernia 05/29/2021   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   Erectile dysfunction due to arterial insufficiency 02/27/2021   Acute bronchitis 12/11/2020   Hemorrhoids 07/17/2020   Syncope 11/15/2018   Diverticulitis 05/25/2018   Osteoarthritis of right knee 12/13/2017   OA (osteoarthritis) of knee 12/13/2017   Testosterone  deficiency 03/22/2017   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/27/2016   BPH (benign prostatic hyperplasia) 06/18/2013   HLD (hyperlipidemia) 07/13/2007   Essential hypertension 07/13/2007   Allergic rhinitis 07/13/2007   Peptic ulcer disease 07/13/2007   Chest pain of uncertain etiology 07/13/2007   History of nephrolithiasis 07/13/2007   Past Surgical History:  Procedure Laterality Date   BROW LIFT Bilateral 11/10/2020   Procedure: BLEPHAROPLASTY;  Surgeon: Elisabeth Craig RAMAN, MD;  Location: [facility];  Service: Plastics;  Laterality: Bilateral;  1 hour   COLONOSCOPY N/A 04/12/2022   Procedure: COLONOSCOPY;  Surgeon: Burnette Fallow, MD;  Location: [facility];  Service: Gastroenterology;  Laterality: N/A;   COLONOSCOPY WITH PROPOFOL  N/A 02/17/2019   Procedure: COLONOSCOPY WITH PROPOFOL ;  Surgeon: Saintclair Jasper, MD;  Location: [facility];  Service: Gastroenterology;  Laterality: N/A;   CORONARY STENT INTERVENTION N/A  08/25/2021   Procedure: CORONARY STENT INTERVENTION;  Surgeon: Dann Candyce RAMAN, MD;  Location: [facility] INVASIVE CV LAB;  Service: Cardiovascular;  Laterality: N/A;   CORONARY ULTRASOUND/IVUS N/A 08/25/2021   Procedure: Intravascular Ultrasound/IVUS;  Surgeon: Dann Candyce RAMAN, MD;  Location: [facility] INVASIVE CV LAB;  Service: Cardiovascular;  Laterality: N/A;   EYE SURGERY     HYDROCELE EXCISION Right 05/15/2003   IR ANGIOGRAM VISCERAL SELECTIVE  02/16/2019   IR ANGIOGRAM VISCERAL SELECTIVE  02/16/2019   IR ANGIOGRAM VISCERAL SELECTIVE  02/16/2019   IR US  GUIDE VASC ACCESS RIGHT  02/16/2019   LEFT HEART CATH AND CORONARY ANGIOGRAPHY N/A 07/28/2021   Procedure: LEFT HEART CATH AND CORONARY ANGIOGRAPHY;  Surgeon: Dann Candyce RAMAN, MD;  Location: [facility] INVASIVE CV LAB;  Service: Cardiovascular;  Laterality: N/A;   LEFT HEART CATH AND CORONARY ANGIOGRAPHY N/A 08/25/2021   Procedure: LEFT HEART CATH AND CORONARY ANGIOGRAPHY;  Surgeon: Dann Candyce RAMAN, MD;  Location: [facility] INVASIVE CV LAB;  Service: Cardiovascular;  Laterality: N/A;   PERIPHERAL INTRAVASCULAR LITHOTRIPSY  08/25/2021   Procedure: INTRAVASCULAR LITHOTRIPSY;  Surgeon: Dann Candyce RAMAN, MD;  Location: [facility] INVASIVE CV LAB;  Service: Cardiovascular;;   ROTATOR CUFF REPAIR     SPERMATOCELECTOMY Right 05/15/2003   TOTAL KNEE ARTHROPLASTY Left 07/11/2023   Procedure: TOTAL KNEE ARTHROPLASTY;  Surgeon: Melodi Lerner, MD;  Location: [facility];  Service: Orthopedics;  Laterality: Left;   Family History  Problem Relation Age of Onset   Cancer Brother    Outpatient Medications Prior to Visit  Medication Sig Dispense Refill   Ascorbic Acid (VITAMIN C) 1000 MG tablet Take 1,000 mg by mouth 2 (two) times a week.     budesonide -formoterol  (SYMBICORT ) 160-4.5 MCG/ACT inhaler Inhale 2 puffs into the lungs 2 (two) times daily. 1 each 2   Cyanocobalamin  (B-12 PO) Take 1 tablet by mouth daily.     docusate sodium  (COLACE) 250 MG capsule Take 250  mg by mouth daily.     hydrochlorothiazide  (HYDRODIURIL ) 25 MG tablet TAKE 1 TABLET BY MOUTH DAILY 100 tablet 2   latanoprost  (XALATAN ) 0.005 % ophthalmic solution Place 1 drop into both eyes at bedtime. 7.5 mL 1   MAGNESIUM  PO Take 400 mg by mouth at bedtime.     methocarbamol  (ROBAXIN ) 500 MG tablet Take 1 tablet (500 mg total) by mouth every 6 (six) hours as needed for muscle spasms. 40 tablet 0   Misc. Devices MISC Nebulizer machine with tubing 1 each 0   nitroGLYCERIN  (NITROSTAT ) 0.4 MG SL tablet Place 1 tablet (0.4 mg total) under the tongue every 5 (five) minutes as needed for chest pain. 25 tablet 3   ondansetron  (ZOFRAN ) 4 MG tablet Take 1 tablet (4 mg total) by mouth every 6 (six) hours as needed for nausea. (Patient taking differently: Take 4 mg by mouth as needed for nausea or vomiting.) 20 tablet 0   pantoprazole  (PROTONIX ) 40 MG tablet Take 1 tablet (40 mg total) by mouth daily. 90 tablet 2   Polyvinyl Alcohol -Povidone (REFRESH OP) Place 1 drop into the left eye 2 (two) times daily as needed (dryness).     psyllium (METAMUCIL) 58.6 % powder Take 1 packet by mouth daily.     rosuvastatin  (CRESTOR ) 20 MG tablet Take 1 tablet by mouth  once daily 90 tablet 3   tadalafil  (CIALIS ) 20 MG tablet Take 1 tablet (20 mg total) by mouth daily as needed for erectile dysfunction. 30 tablet 2   tamsulosin  (FLOMAX ) 0.4 MG CAPS capsule TAKE 1 CAPSULE BY MOUTH DAILY 100 capsule 2   valsartan  (DIOVAN ) 320 MG tablet Take 1 tablet by mouth once daily 90 tablet 1   zinc gluconate 50 MG tablet Take 50 mg by mouth daily.     albuterol  (PROVENTIL ) (2.5 MG/3ML) 0.083% nebulizer solution Take 3 mLs (2.5 mg total) by nebulization every 6 (six) hours as needed for wheezing or shortness of breath. 150 mL 1   chlorpheniramine-HYDROcodone  (TUSSIONEX) 10-8 MG/5ML Take 5 mLs by mouth every 12 (twelve) hours as needed for cough. 115 mL 0   potassium chloride  20 MEQ TBCR Take 1 tablet/day for 20-day 20 tablet 0    furosemide  (LASIX ) 20 MG tablet Take 1 tablet (20 mg total) by mouth daily. 30 tablet 0   dextromethorphan -guaiFENesin  (MUCINEX  DM) 30-600 MG 12hr tablet Take 1 tablet by mouth 2 (two) times daily as needed for cough.     doxycycline  (VIBRA -TABS) 100 MG tablet Take 1 tablet (100 mg total) by mouth 2 (two) times daily. 14 tablet 0   traZODone  (DESYREL ) 50 MG tablet Take 25-50 mg by mouth at bedtime as needed.     No facility-administered medications prior to visit.   No Known Allergies   Objective:   Today's Vitals   12/13/23 1544  BP: (!) 122/58  Pulse: 77  Temp: 97.8 F (36.6 C)  TempSrc: Temporal  SpO2: 96%  Weight: 158 lb 9.6 oz (71.9 kg)  Height: 5' 4 (1.626 m)   Body mass index is 27.22 kg/m.   General: Well developed, well nourished. No acute distress. HEENT: Nose is clear. Oropharynx is clear without exudates or mucous streaking. Lungs: Clear to auscultation bilaterally. No wheezing, rales or rhonchi. Intermittent cough CV: RRR without murmurs or rubs, though an occasional skip beat. Pulses 2+ bilaterally. No lower leg   edema. Psych: Alert and oriented. Normal mood and affect.  Health Maintenance Due  Topic Date Due   Medicare Annual Wellness (AWV)  01/11/2024     Assessment & Plan:   Problem List Items Addressed This Visit       Other   Persistent cough for 3 weeks or longer - Primary   David Goodman has had a cough illness going on for > 6 weeks. This did begin with a typical acute viral bronchitis picture. He ahs been unresponsive to multiple therapies and is being worn down physically bu the persistent cough and the lack of adequate sleep. I did advise him to stop use of medicated cough drops, recommending he try using a hard candy instead. Menthol  has been associated with upper airway cough syndrome. He does feel the cough originates in the trachea/larynx. I will try and get him in with pulmonology this week to evaluate further.      Relevant Medications    chlorpheniramine-HYDROcodone  (TUSSIONEX) 10-8 MG/5ML   Other Relevant Orders   Ambulatory referral to Pulmonology   Other Visit Diagnoses       Secondary insomnia       I will cautiously prescribe a small quantity of Ambien .   Relevant Medications   zolpidem  (AMBIEN ) 5 MG tablet       Return in about 4 weeks (around 01/10/2024) for Reassessment.   Garnette CHRISTELLA Simpler, MD

## 2023-12-13 NOTE — Assessment & Plan Note (Signed)
 David Goodman has had a cough illness going on for > 6 weeks. This did begin with a typical acute viral bronchitis picture. He ahs been unresponsive to multiple therapies and is being worn down physically bu the persistent cough and the lack of adequate sleep. I did advise him to stop use of medicated cough drops, recommending he try using a hard candy instead. Menthol  has been associated with upper airway cough syndrome. He does feel the cough originates in the trachea/larynx. I will try and get him in with pulmonology this week to evaluate further.

## 2023-12-15 ENCOUNTER — Other Ambulatory Visit: Payer: Self-pay | Admitting: Nurse Practitioner

## 2023-12-15 DIAGNOSIS — J209 Acute bronchitis, unspecified: Secondary | ICD-10-CM

## 2023-12-16 ENCOUNTER — Other Ambulatory Visit: Payer: Self-pay | Admitting: Family Medicine

## 2023-12-16 DIAGNOSIS — F5101 Primary insomnia: Secondary | ICD-10-CM

## 2024-01-12 ENCOUNTER — Encounter: Payer: Self-pay | Admitting: Adult Health

## 2024-01-12 ENCOUNTER — Ambulatory Visit: Payer: Medicare Other | Admitting: Adult Health

## 2024-01-12 VITALS — BP 123/73 | HR 68 | Temp 97.7°F | Ht 64.0 in | Wt 163.0 lb

## 2024-01-12 DIAGNOSIS — R059 Cough, unspecified: Secondary | ICD-10-CM | POA: Diagnosis not present

## 2024-01-12 DIAGNOSIS — J8410 Pulmonary fibrosis, unspecified: Secondary | ICD-10-CM | POA: Diagnosis not present

## 2024-01-12 MED ORDER — ALBUTEROL SULFATE (2.5 MG/3ML) 0.083% IN NEBU: 2.5000 mg | INHALATION_SOLUTION | Freq: Four times a day (QID) | RESPIRATORY_TRACT | Status: DC | PRN

## 2024-01-12 MED ORDER — ALBUTEROL SULFATE HFA 108 (90 BASE) MCG/ACT IN AERS: 1.0000 | INHALATION_SPRAY | Freq: Four times a day (QID) | RESPIRATORY_TRACT | 2 refills | Status: DC | PRN

## 2024-01-12 NOTE — Patient Instructions (Addendum)
Delsym 2 tsp Twice daily  For cough As needed   Albuterol inhaler or Neb every 6hrs as needed for wheezing /shortness of breath.  Flutter valve As needed   Follow up with Dr. Isaiah Serge in 6 months with PFT and As needed   Please contact office for sooner follow up if symptoms do not improve or worsen or seek emergency care

## 2024-01-12 NOTE — Progress Notes (Signed)
Patient seen in the office today and instructed on use of flutter valve.  Patient expressed understanding and demonstrated technique.

## 2024-01-12 NOTE — Progress Notes (Signed)
@Patient  ID: David Goodman, male    DOB: 10-Dec-1940, 83 y.o.   MRN: [id number]  Chief Complaint  Patient presents with   Follow-up   Discussed the use of AI scribe software for clinical note transcription with the patient, who gave verbal consent to proceed.  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 NSIP pattern    Pulmonary function test May 30, 2020 no data for FEV1 or FVC.  Total lung capacity 168%, DLCO 103%.   High-resolution CT chest October 14, 2020 showed no lymphadenopathy.  4 mm right lower lobe lung nodule stable.  Mild to moderate patchy groundglass opacities and reticulation throughout both lungs with mild traction bronc he ectasis and architectural distortion.  Compared to January 18, 2020 groundglass opacities and reticulations have improved.  Findings are suggestive of an alternative diagnosis.  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   Pets: no pets  Occupation: run restaurants and owns storage buildings  Exposures: none , no hot tub , no basement, no methotrexate, amiodarone, or macrobid.  Smoking history: never smoker  Travel history: travel to [place]. 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 Last seen 2022.  Patient is followed for ongoing shortness of breath and cough after having COVID-19 infection in December 2020.  Workup revealed an abnormal high-resolution CT chest that showed mild to moderate patchy groundglass opacities and  reticulation which had improved and felt to be an alternative diagnosis not UIP.  Felt compatible with postinflammatory fibrosis from COVID-19 infection.  Patient had clinically improved at last visit in January 2022.  With some residual shortness of breath with heavy activities and mild intermittent minimally productive dry cough. Patient presents today for follow-up from emergency room visit.  Patient says about 2 months ago he developed what he describes as a severe bronchitis with cough, congestion and malaise.  He was seen in the emergency room.  Chest xray ER 11/10/23 Bibasilar scarring.  Otherwise no acute process was noted.  He was given some cough control regimen and recommended on supportive care.  Patient was seen by his primary care provider also given a albuterol inhaler and nebulizer which has helped some as well patient says it took a lot of over-the-counter cough medicines over the last several weeks to finally get better.  Patient says he is now much improved. Patient did have a CT chest in August 2024 after knee surgery to rule out PE.  This showed bilateral streaky interstitial atelectasis with no acute consolidation.  Negative for PE.  He confirms receiving the flu shot and RSV vaccine, and his pneumonia vaccination is up to date. No current use of cough medicine or sleeping aids.  He has lived in the [place] for 62 years, originally from [place], and currently resides in a condo on the first floor. He has family in [place], including a sister and a brother, and frequently visits them. He does not have any pets,      David Goodman is an 83 year old male with  post-inflammatory fibrosis who presents for follow-up of respiratory symptoms.  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 He experienced significant improvement in symptoms after traveling to [place], attributing it to the climate. He occasionally  coughs but notes significant improvement. He does not currently use an albuterol inhaler but has a nebulizer at home, which he has not needed recently. He uses a flutter valve device from a previous bronchitis episode to help clear mucus when necessary.  He was sick with bronchitis the week before Thanksgiving, lasting about a month to a month and a half. During this period, he visited the doctor six times and found relief using a nebulizer with medication, as suggested by his sister, a former Engineer, civil (consulting). His symptoms have improved significantly since then.  He confirms receiving the flu shot and RSV vaccine, and his pneumonia vaccination is up to date. No current use of cough medicine or sleeping aids.  He has lived in the [place] for 62 years, originally from [place], and currently resides in a condo on the first floor. He has family in [place], including a sister and a brother, and frequently visits them. He does not have any pets, a hot tub, or a basement.       No Known Allergies  Immunization History  Administered Date(s) Administered   Fluad Quad(high Dose 65+) 08/10/2019, 09/03/2022   Influenza, High Dose Seasonal PF 07/31/2015, 09/27/2016, 10/10/2017, 09/30/2018   Influenza,inj,quad, With Preservative 09/30/2018   Influenza-Unspecified 09/14/2017, 09/30/2018, 10/28/2020   PFIZER(Purple Top)SARS-COV-2 Vaccination 01/21/2020, 02/22/2020, 10/28/2020   Pfizer Covid-19 Vaccine Bivalent Booster 6yrs & up 11/10/2021   Pneumococcal Conjugate-13 05/19/2015   Pneumococcal Polysaccharide-23 07/16/2009, 09/30/2018   Pneumococcal-Unspecified 09/30/2018   RSV,unspecified 06/18/2023   Td 07/16/2009   Tdap 05/19/2015   Zoster Recombinant(Shingrix) 06/18/2023, 09/06/2023   Zoster, Live 04/19/2011    Past Medical History:  Diagnosis Date   ALLERGIC RHINITIS 07/13/2007   Aorta dilated on Echo; Normal sized Aorta on CT    Chest CT 1/23: No thoracic aortic aneurysm; coronary calcifications;  aortic atherosclerosis; improved aeration of lungs with persistent minimal reticular opacities   Arthritis    Blood transfusion without reported diagnosis    CAD (coronary artery disease)    CHF (congestive heart failure) (HCC)    Clotting disorder (HCC)    Diverticulitis    GERD (gastroesophageal reflux disease)    GI bleed    Glaucoma    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)    HYPERLIPIDEMIA 07/13/2007   HYPERTENSION 07/13/2007   Impaired glucose tolerance 09/27/2016   Iron deficiency anemia due to chronic blood loss 02/27/2021   NEPHROLITHIASIS, 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   NSVT (nonsustained ventricular tachycardia) (HCC)    PEPTIC ULCER DISEASE 07/13/2007   PVC's (premature ventricular contractions)    Sleep apnea    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     chlorpheniramine-HYDROcodone (TUSSIONEX) 10-8 MG/5ML Take 5 mLs by mouth every 12 (twelve) hours as needed for cough. 115 mL 0   Cyanocobalamin (B-12 PO) Take 1 tablet by mouth daily.     docusate sodium (COLACE) 250 MG capsule Take  250 mg by mouth daily.     hydrochlorothiazide (HYDRODIURIL) 25 MG tablet TAKE 1 TABLET BY MOUTH DAILY 100 tablet 2   latanoprost (XALATAN) 0.005 % ophthalmic solution Place 1 drop into both eyes at bedtime. 7.5 mL 1   MAGNESIUM PO Take 400 mg by mouth at bedtime.     methocarbamol (ROBAXIN) 500 MG tablet Take 1 tablet (500 mg total) by mouth every 6 (six) hours as needed for muscle spasms. 40 tablet 0   Misc. Devices MISC Nebulizer machine with tubing 1 each 0   nitroGLYCERIN (NITROSTAT) 0.4 MG SL tablet Place 1 tablet (0.4 mg total) under the tongue every 5 (five) minutes as  needed for chest pain. 25 tablet 3   ondansetron (ZOFRAN) 4 MG tablet Take 1 tablet (4 mg total) by mouth every 6 (six) hours as needed for nausea. (Patient taking differently: Take 4 mg by mouth as needed for nausea or vomiting.) 20 tablet 0   pantoprazole (PROTONIX) 40 MG tablet Take 1 tablet (40 mg total) by mouth daily. 90 tablet 2   Polyvinyl Alcohol-Povidone (REFRESH OP) Place 1 drop into the left eye 2 (two) times daily as needed (dryness).     psyllium (METAMUCIL) 58.6 % powder Take 1 packet by mouth daily.     rosuvastatin (CRESTOR) 20 MG tablet Take 1 tablet by mouth once daily 90 tablet 3   tamsulosin (FLOMAX) 0.4 MG CAPS capsule TAKE 1 CAPSULE BY MOUTH DAILY 100 capsule 2   traZODone (DESYREL) 50 MG tablet TAKE 1/2 TO 1 (ONE-HALF TO ONE) TABLET BY MOUTH AT BEDTIME AS NEEDED FOR SLEEP 30 tablet 0   valsartan (DIOVAN) 320 MG tablet Take 1 tablet by mouth once daily 90 tablet 1   zinc gluconate 50 MG tablet Take 50 mg by mouth daily.     furosemide (LASIX) 20 MG tablet Take 1 tablet (20 mg total) by mouth daily. 30 tablet 0   tadalafil (CIALIS) 20 MG tablet Take 1 tablet (20 mg total) by mouth daily as needed for erectile dysfunction. (Patient not taking: Reported on 01/12/2024) 30 tablet 2   budesonide-formoterol (SYMBICORT) 160-4.5 MCG/ACT inhaler Inhale 2 puffs into the lungs 2 (two) times daily. 1 each 2   zolpidem (AMBIEN) 5 MG tablet Take 1 tablet (5 mg total) by mouth at bedtime as needed for sleep. 15 tablet 0   No facility-administered medications prior to visit.     Review of Systems:   Constitutional:   No  weight loss, night sweats,  Fevers, chills, fatigue, or  lassitude.  HEENT:   No headaches,  Difficulty swallowing,  Tooth/dental problems, or  Sore throat,                No sneezing, itching, ear ache, nasal congestion, post nasal drip,   CV:  No chest pain,  Orthopnea, PND, swelling in lower extremities, anasarca, dizziness, palpitations, syncope.   GI  No  heartburn, indigestion, abdominal pain, nausea, vomiting, diarrhea, change in bowel habits, loss of appetite, bloody stools.   Resp: No wheezing.  No chest wall deformity  Skin: no rash or lesions.  GU: no dysuria, change in color of urine, no urgency or frequency.  No flank pain, no hematuria   MS:  No joint pain or swelling.  No decreased range of motion.  No back pain.    Physical Exam  BP 123/73 (BP Location: Left Arm, Patient Position: Sitting, Cuff Size: Large)   Pulse 68   Temp 97.7 F (  36.5 C) (Oral)   Ht 5\' 4"  (1.626 m)   Wt 163 lb (73.9 kg)   SpO2 97%   BMI 27.98 kg/m   GEN: A/Ox3; pleasant , NAD, well nourished    HEENT:  [place]/AT,  NOSE-clear, THROAT-clear, no lesions, no postnasal drip or exudate noted.   NECK:  Supple w/ fair ROM; no JVD; normal carotid impulses w/o bruits; no thyromegaly or nodules palpated; no lymphadenopathy.    RESP  Clear  P & A; w/o, wheezes/ rales/ or rhonchi. no accessory muscle use, no dullness to percussion  CARD:  RRR, no m/r/g, no peripheral edema, pulses intact, no cyanosis or clubbing.  GI:   Soft & nt; nml bowel sounds; no organomegaly or masses detected.   Musco: Warm bil, no deformities or joint swelling noted.   Neuro: alert, no focal deficits noted.    Skin: Warm, no lesions or rashes    Lab Results:  CBC   BMET  Imaging: No results found.  ipratropium-albuterol (DUONEB) 0.5-2.5 (3) MG/3ML nebulizer solution 3 mL     Date Action Dose Route User   11/28/2023 1110 Given 3 mL Nebulization (Mouth) Deberry, Sharia Reeve D, CMA      methylPREDNISolone sodium succinate (SOLU-MEDROL) 125 mg/2 mL injection 125 mg     Date Action Dose Route User   11/28/2023 1120 Given 125 mg Intramuscular (Right Ventrogluteal) Mary Sella D, CMA          Latest Ref Rng & Units 05/30/2020    8:51 AM  PFT Results  DLCO uncorrected ml/min/mmHg 21.74   DLCO UNC% % 103   DLVA Predicted % 106   TLC L 10.16   TLC % Predicted % 168    RV % Predicted % 285     No results found for: "NITRICOXIDE"      Assessment & Plan:   Assessment and Plan    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  CT chest August 2024 showed stable bibasilar scarring.  Will check PFTs in 6 months.    Bronchitis-recent slow to resolve exacerbation now back to baseline.  Delsym cough syrup as needed.  Flutter valve May use albuterol inhaler or nebulizer as needed.  Continue with symptom management of cough control and trigger prevention.  General Health Maintenance Vaccinations for flu, RSV, and pneumonia are up to date. Plans to start exercising three times a week to enhance overall health and manage a knee condition. Regular exercise and good hand hygiene are encouraged to prevent infections.  Follow-up Schedule a follow-up visit in six months for a PFT.       I spent 42   minutes dedicated to the care of this patient on the date of this encounter to include pre-visit review of records, face-to-face time with the patient discussing conditions above, post visit ordering of testing, clinical documentation with the electronic health record, making appropriate referrals as documented, and communicating necessary findings to members of the patients care team.   Rubye Oaks, NP 01/12/2024

## 2024-01-16 ENCOUNTER — Ambulatory Visit (INDEPENDENT_AMBULATORY_CARE_PROVIDER_SITE_OTHER): Payer: Medicare Other

## 2024-01-16 VITALS — BP 102/60 | HR 71 | Temp 98.2°F | Ht 64.5 in | Wt 163.8 lb

## 2024-01-16 DIAGNOSIS — Z0000 Encounter for general adult medical examination without abnormal findings: Secondary | ICD-10-CM | POA: Diagnosis not present

## 2024-01-16 NOTE — Progress Notes (Signed)
Subjective:   David Goodman is a 83 y.o. male who presents for Medicare Annual/Subsequent preventive examination.  Visit Complete: In person    Cardiac Risk Factors include: advanced age (>40men, >30 women);dyslipidemia;hypertension;male gender     Objective:    Today's Vitals   01/16/24 1036 01/16/24 1037  BP: 102/60   Pulse: 71   Temp: 98.2 F (36.8 C)   TempSrc: Oral   SpO2: 96%   Weight: 163 lb 12.8 oz (74.3 kg)   Height: 5' 4.5" (1.638 m)   PainSc:  5    Body mass index is 27.68 kg/m.     01/16/2024   10:48 AM 11/10/2023    9:25 AM 07/14/2023    4:16 PM 07/11/2023   11:11 AM 07/01/2023   11:18 AM 01/10/2023   11:05 AM 09/05/2022    3:11 PM  Advanced Directives  Does Patient Have a Medical Advance Directive? Yes No Yes Yes Yes Yes No  Type of Estate agent of [place];Living will  Living will Living will Healthcare Power of [place];Living will Healthcare Power of [place];Living will   Does patient want to make changes to medical advance directive?   No - Patient declined No - Patient declined     Copy of Healthcare Power of Attorney in Chart? No - copy requested     No - copy requested   Would patient like information on creating a medical advance directive?  No - Patient declined     No - Patient declined    Current Medications (verified) Outpatient Encounter Medications as of 01/16/2024  Medication Sig   albuterol (PROVENTIL) (2.5 MG/3ML) 0.083% nebulizer solution Take 3 mLs (2.5 mg total) by nebulization every 6 (six) hours as needed for wheezing or shortness of breath.   albuterol (VENTOLIN HFA) 108 (90 Base) MCG/ACT inhaler Inhale 1-2 puffs into the lungs every 6 (six) hours as needed.   Ascorbic Acid (VITAMIN C) 1000 MG tablet Take 1,000 mg by mouth 2 (two) times a week.   Cyanocobalamin (B-12 PO) Take 1 tablet by mouth daily.   hydrochlorothiazide (HYDRODIURIL) 25 MG tablet TAKE 1 TABLET BY MOUTH DAILY   latanoprost (XALATAN) 0.005 %  ophthalmic solution Place 1 drop into both eyes at bedtime.   MAGNESIUM PO Take 400 mg by mouth at bedtime.   methocarbamol (ROBAXIN) 500 MG tablet Take 1 tablet (500 mg total) by mouth every 6 (six) hours as needed for muscle spasms.   Misc. Devices MISC Nebulizer machine with tubing   nitroGLYCERIN (NITROSTAT) 0.4 MG SL tablet Place 1 tablet (0.4 mg total) under the tongue every 5 (five) minutes as needed for chest pain.   pantoprazole (PROTONIX) 40 MG tablet Take 1 tablet (40 mg total) by mouth daily.   Polyvinyl Alcohol-Povidone (REFRESH OP) Place 1 drop into the left eye 2 (two) times daily as needed (dryness).   psyllium (METAMUCIL) 58.6 % powder Take 1 packet by mouth daily.   rosuvastatin (CRESTOR) 20 MG tablet Take 1 tablet by mouth once daily   tamsulosin (FLOMAX) 0.4 MG CAPS capsule TAKE 1 CAPSULE BY MOUTH DAILY   valsartan (DIOVAN) 320 MG tablet Take 1 tablet by mouth once daily   zinc gluconate 50 MG tablet Take 50 mg by mouth daily.   chlorpheniramine-HYDROcodone (TUSSIONEX) 10-8 MG/5ML Take 5 mLs by mouth every 12 (twelve) hours as needed for cough.   docusate sodium (COLACE) 250 MG capsule Take 250 mg by mouth daily.   furosemide (LASIX) 20 MG tablet  Take 1 tablet (20 mg total) by mouth daily.   ondansetron (ZOFRAN) 4 MG tablet Take 1 tablet (4 mg total) by mouth every 6 (six) hours as needed for nausea. (Patient not taking: Reported on 01/16/2024)   tadalafil (CIALIS) 20 MG tablet Take 1 tablet (20 mg total) by mouth daily as needed for erectile dysfunction. (Patient not taking: Reported on 01/12/2024)   traZODone (DESYREL) 50 MG tablet TAKE 1/2 TO 1 (ONE-HALF TO ONE) TABLET BY MOUTH AT BEDTIME AS NEEDED FOR SLEEP   No facility-administered encounter medications on file as of 01/16/2024.    Allergies (verified) Patient has no known allergies.   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)    HYPERLIPIDEMIA 07/13/2007   HYPERTENSION 07/13/2007   Impaired glucose tolerance 09/27/2016   Iron deficiency anemia due to chronic blood loss 02/27/2021   NEPHROLITHIASIS, 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   NSVT (nonsustained ventricular tachycardia) (HCC)    PEPTIC ULCER DISEASE 07/13/2007   PVC's (premature ventricular contractions)    Sleep apnea    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   Past Surgical History:  Procedure Laterality Date   BROW LIFT Bilateral 11/10/2020   Procedure: BLEPHAROPLASTY;  Surgeon: Allena Napoleon, MD;  Location: [facility];  Service: Plastics;  Laterality: Bilateral;  1 hour   COLONOSCOPY N/A 04/12/2022   Procedure: COLONOSCOPY;  Surgeon: Willis Modena, MD;  Location: [facility];  Service: Gastroenterology;  Laterality: N/A;   COLONOSCOPY WITH PROPOFOL N/A 02/17/2019   Procedure: COLONOSCOPY WITH PROPOFOL;  Surgeon: [name], MD;  Location: [facility];  Service: Gastroenterology;  Laterality: N/A;   CORONARY STENT INTERVENTION N/A 08/25/2021   Procedure: CORONARY STENT INTERVENTION;  Surgeon: Corky Crafts, MD;  Location: [facility] INVASIVE CV LAB;  Service: Cardiovascular;  Laterality: N/A;   CORONARY ULTRASOUND/IVUS N/A 08/25/2021   Procedure: Intravascular Ultrasound/IVUS;  Surgeon: Corky Crafts, MD;  Location: [facility] INVASIVE CV LAB;  Service: Cardiovascular;  Laterality: N/A;   EYE SURGERY     HYDROCELE EXCISION Right 05/15/2003   IR ANGIOGRAM VISCERAL  SELECTIVE  02/16/2019   IR ANGIOGRAM VISCERAL SELECTIVE  02/16/2019   IR ANGIOGRAM VISCERAL SELECTIVE  02/16/2019   IR US GUIDE VASC ACCESS RIGHT  02/16/2019   LEFT HEART CATH AND CORONARY ANGIOGRAPHY N/A 07/28/2021   Procedure: LEFT HEART CATH AND CORONARY ANGIOGRAPHY;  Surgeon: Corky Crafts, MD;  Location: [facility] INVASIVE CV LAB;  Service: Cardiovascular;  Laterality: N/A;   LEFT HEART CATH AND CORONARY ANGIOGRAPHY N/A 08/25/2021   Procedure: LEFT HEART CATH AND CORONARY ANGIOGRAPHY;  Surgeon: Corky Crafts, MD;  Location: [facility] INVASIVE CV LAB;  Service: Cardiovascular;  Laterality: N/A;   PERIPHERAL INTRAVASCULAR LITHOTRIPSY  08/25/2021   Procedure: INTRAVASCULAR LITHOTRIPSY;  Surgeon: Corky Crafts, MD;  Location: [facility] INVASIVE CV LAB;  Service: Cardiovascular;;   ROTATOR CUFF REPAIR     SPERMATOCELECTOMY Right 05/15/2003   TOTAL  KNEE ARTHROPLASTY Left 07/11/2023   Procedure: TOTAL KNEE ARTHROPLASTY;  Surgeon: Ollen Gross, MD;  Location: [facility];  Service: Orthopedics;  Laterality: Left;   Family History  Problem Relation Age of Onset   Cancer Brother    Social History   Socioeconomic History   Marital status: Married    Spouse name: Not on file   Number of children: Not on file   Years of education: Not on file   Highest education level: 12th grade  Occupational History   Not on file  Tobacco Use   Smoking status: Never   Smokeless tobacco: Never  Vaping Use   Vaping status: Never Used  Substance and Sexual Activity   Alcohol use: Not Currently    Alcohol/week: 2.0 standard drinks of alcohol    Types: 2 Glasses of wine per week    Comment: couple times a week - wine   Drug use: No   Sexual activity: Not Currently  Other Topics Concern   Not on file  Social History Narrative   Lives alone   Right handed   Caffeine: 1 cup of coffee a day   Social Drivers of Corporate investment banker Strain: Low Risk  (01/16/2024)   Overall Financial Resource  Strain (CARDIA)    Difficulty of Paying Living Expenses: Not hard at all  Recent Concern: Financial Resource Strain - Medium Risk (11/20/2023)   Overall Financial Resource Strain (CARDIA)    Difficulty of Paying Living Expenses: Somewhat hard  Food Insecurity: No Food Insecurity (01/16/2024)   Hunger Vital Sign    Worried About Running Out of Food in the Last Year: Never true    Ran Out of Food in the Last Year: Never true  Transportation Needs: No Transportation Needs (01/16/2024)   PRAPARE - Administrator, Civil Service (Medical): No    Lack of Transportation (Non-Medical): No  Physical Activity: Insufficiently Active (01/16/2024)   Exercise Vital Sign    Days of Exercise per Week: 3 days    Minutes of Exercise per Session: 30 min  Stress: No Stress Concern Present (01/16/2024)   [employer] of Occupational Health - Occupational Stress Questionnaire    Feeling of Stress : Not at all  Social Connections: Moderately Integrated (01/16/2024)   Social Connection and Isolation Panel [NHANES]    Frequency of Communication with Friends and Family: More than three times a week    Frequency of Social Gatherings with Friends and Family: Once a week    Attends Religious Services: More than 4 times per year    Active Member of [employer] or Organizations: No    Attends Engineer, structural: Never    Marital Status: Married    Tobacco Counseling Counseling given: Not Answered   Clinical Intake:  Pre-visit preparation completed: Yes  Pain : 0-10 Pain Score: 5  Pain Type: Chronic pain Pain Location: Back Pain Orientation: Right Pain Descriptors / Indicators: Aching Pain Onset: More than a month ago Pain Frequency: Intermittent     Nutritional Status: BMI 25 -29 Overweight Nutritional Risks: Nausea/ vomitting/ diarrhea (nausea having an endoscopy 01/18/2024) Diabetes: No  How often do you need to have someone help you when you read instructions, pamphlets, or  other written materials from your doctor or pharmacy?: 1 - Never  Interpreter Needed?: No  Information entered by :: NAllen LPN   Activities of Daily Living    01/16/2024   10:40 AM 07/15/2023   10:05 AM  In your  present state of health, do you have any difficulty performing the following activities:  Hearing? 1 1  Comment has hearing aids   Vision? 0 0  Difficulty concentrating or making decisions? 0 0  Walking or climbing stairs? 0 1  Dressing or bathing? 0 0  Doing errands, shopping? 0   Preparing Food and eating ? N   Using the Toilet? N   In the past six months, have you accidently leaked urine? N   Do you have problems with loss of bowel control? N   Managing your Medications? N   Managing your Finances? N   Housekeeping or managing your Housekeeping? N     Patient Care Team: Loyola Mast, MD as PCP - General (Family Medicine) Corky Crafts, MD as PCP - Cardiology (Cardiology) Corky Crafts, MD as Consulting Physician (Cardiology) Dimitri Ped, MD as Consulting Physician (Surgery) Ollen Gross, MD as Consulting Physician (Orthopedic Surgery) Willis Modena, MD as Consulting Physician (Gastroenterology) Dohmeier, Porfirio Mylar, MD as Consulting Physician (Neurology)  Indicate any recent Medical Services you may have received from other than [facility] providers in the past year (date may be approximate).     Assessment:   This is a routine wellness examination for [place].  Hearing/Vision screen Hearing Screening - Comments:: Has hearing aids that maintained Vision Screening - Comments:: Regular eye exams, [facility], [facility]   Goals Addressed             This Visit's Progress    Patient Stated       01/16/2024, wants to keep weight off       Depression Screen    01/16/2024   10:51 AM 11/28/2023   10:34 AM 10/21/2023   10:31 AM 06/06/2023    9:03 AM 06/01/2023    2:39 PM 02/08/2023    9:28 AM 01/10/2023   11:06 AM  PHQ 2/9 Scores   PHQ - 2 Score 0 0 0 0 0 0 0  PHQ- 9 Score 0   3  4     Fall Risk    01/16/2024   10:49 AM 01/12/2024    9:01 AM 11/28/2023   10:34 AM 10/21/2023   10:31 AM 06/06/2023    9:03 AM  Fall Risk   Falls in the past year? 0 0 0 0 0  Number falls in past yr: 0  0 0 0  Injury with Fall? 0  0 0 0  Risk for fall due to : Medication side effect  No Fall Risks No Fall Risks No Fall Risks  Follow up Falls prevention discussed;Falls evaluation completed  Falls prevention discussed Falls prevention discussed Falls evaluation completed    MEDICARE RISK AT HOME: Medicare Risk at Home Any stairs in or around the home?: No If so, are there any without handrails?: No Home free of loose throw rugs in walkways, pet beds, electrical cords, etc?: Yes Adequate lighting in your home to reduce risk of falls?: Yes Life alert?: No Use of a cane, walker or w/c?: No Grab bars in the bathroom?: No Shower chair or bench in shower?: No Elevated toilet seat or a handicapped toilet?: No  TIMED UP AND GO:  Was the test performed?  Yes  Length of time to ambulate 10 feet: 5 sec Gait slow and steady without use of assistive device    Cognitive Function:        01/16/2024   10:51 AM 01/10/2023   11:08 AM  6CIT Screen  What  Year? 0 points 0 points  What month? 0 points 0 points  What time? 0 points 0 points  Count back from 20 0 points 0 points  Months in reverse 0 points 0 points  Repeat phrase 2 points 4 points  Total Score 2 points 4 points    Immunizations Immunization History  Administered Date(s) Administered   Fluad Quad(high Dose 65+) 08/10/2019, 09/03/2022   Influenza, High Dose Seasonal PF 07/31/2015, 09/27/2016, 10/10/2017, 09/30/2018   Influenza,inj,quad, With Preservative 09/30/2018   Influenza-Unspecified 09/14/2017, 09/30/2018, 10/28/2020   PFIZER(Purple Top)SARS-COV-2 Vaccination 01/21/2020, 02/22/2020, 10/28/2020   Pfizer Covid-19 Vaccine Bivalent Booster 64yrs & up 11/10/2021    Pneumococcal Conjugate-13 05/19/2015   Pneumococcal Polysaccharide-23 07/16/2009, 09/30/2018   Pneumococcal-Unspecified 09/30/2018   RSV,unspecified 06/18/2023   Td 07/16/2009   Tdap 05/19/2015   Zoster Recombinant(Shingrix) 06/18/2023, 09/06/2023   Zoster, Live 04/19/2011    TDAP status: Up to date  Flu Vaccine status: Due, Education has been provided regarding the importance of this vaccine. Advised may receive this vaccine at local pharmacy or Health Dept. Aware to provide a copy of the vaccination record if obtained from local pharmacy or Health Dept. Verbalized acceptance and understanding.  Pneumococcal vaccine status: Up to date  Covid-19 vaccine status: Information provided on how to obtain vaccines.   Qualifies for Shingles Vaccine? Yes   Zostavax completed Yes   Shingrix Completed?: Yes  Screening Tests Health Maintenance  Topic Date Due   COVID-19 Vaccine (5 - 2024-25 season) 01/28/2024 (Originally 07/31/2023)   INFLUENZA VACCINE  02/27/2024 (Originally 06/30/2023)   Medicare Annual Wellness (AWV)  01/15/2025   DTaP/Tdap/Td (3 - Td or Tdap) 05/18/2025   Pneumonia Vaccine 64+ Years old  Completed   Zoster Vaccines- Shingrix  Completed   HPV VACCINES  Aged Out    Health Maintenance  There are no preventive care reminders to display for this patient.   Colorectal cancer screening: No longer required.   Lung Cancer Screening: (Low Dose CT Chest recommended if Age 35-80 years, 20 pack-year currently smoking OR have quit w/in 15years.) does not qualify.   Lung Cancer Screening Referral: no  Additional Screening:  Hepatitis C Screening: does not qualify;   Vision Screening: Recommended annual ophthalmology exams for early detection of glaucoma and other disorders of the eye. Is the patient up to date with their annual eye exam?  Yes  Who is the provider or what is the name of the office in which the patient attends annual eye exams? [facility] If pt is not  established with a provider, would they like to be referred to a provider to establish care? No .   Dental Screening: Recommended annual dental exams for proper oral hygiene  Diabetic Foot Exam: n/a  Community Resource Referral / Chronic Care Management: CRR required this visit?  No   CCM required this visit?  No     Plan:     I have personally reviewed and noted the following in the patient's chart:   Medical and social history Use of alcohol, tobacco or illicit drugs  Current medications and supplements including opioid prescriptions. Patient is not currently taking opioid prescriptions. Functional ability and status Nutritional status Physical activity Advanced directives List of other physicians Hospitalizations, surgeries, and ER visits in previous 12 months Vitals Screenings to include cognitive, depression, and falls Referrals and appointments  In addition, I have reviewed and discussed with patient certain preventive protocols, quality metrics, and best practice recommendations. A written personalized care plan  for preventive services as well as general preventive health recommendations were provided to patient.     Barb Merino, LPN   1/32/4401   After Visit Summary: (In Person-Printed) AVS printed and given to the patient  Nurse Notes: none

## 2024-01-16 NOTE — Patient Instructions (Signed)
David Goodman , Thank you for taking time to come for your Medicare Wellness Visit. I appreciate your ongoing commitment to your health goals. Please review the following plan we discussed and let me know if I can assist you in the future.   Referrals/Orders/Follow-Ups/Clinician Recommendations: none  This is a list of the screening recommended for you and due dates:  Health Maintenance  Topic Date Due   COVID-19 Vaccine (5 - 2024-25 season) 01/28/2024*   Flu Shot  02/27/2024*   Medicare Annual Wellness Visit  01/15/2025   DTaP/Tdap/Td vaccine (3 - Td or Tdap) 05/18/2025   Pneumonia Vaccine  Completed   Zoster (Shingles) Vaccine  Completed   HPV Vaccine  Aged Out  *Topic was postponed. The date shown is not the original due date.    Advanced directives:   Next Medicare Annual Wellness Visit scheduled for next year: yes  insert Preventive Care attachment Insert FALL PREVENTION attachment if needed

## 2024-01-18 DIAGNOSIS — K2970 Gastritis, unspecified, without bleeding: Secondary | ICD-10-CM | POA: Diagnosis not present

## 2024-01-18 DIAGNOSIS — R110 Nausea: Secondary | ICD-10-CM | POA: Diagnosis not present

## 2024-01-18 DIAGNOSIS — K317 Polyp of stomach and duodenum: Secondary | ICD-10-CM | POA: Diagnosis not present

## 2024-01-18 DIAGNOSIS — K449 Diaphragmatic hernia without obstruction or gangrene: Secondary | ICD-10-CM | POA: Diagnosis not present

## 2024-01-24 NOTE — Progress Notes (Unsigned)
 Office Visit    Patient Name: David Goodman Date of Encounter: 01/25/2024  PCP:  Loyola Mast, MD   [facility]  Cardiologist:  Lance Muss, MD  Advanced Practice Provider:  No care team member to display Electrophysiologist:  None   HPI    David Goodman is a 83 y.o. male with a history of coronary artery disease (status post DES to proximal to mid LAD in 07/2021), heart failure with mildly reduced ejection fraction (echocardiogram 06/2239 to 45%, grade 1 DD, global HK, echo 11/2021 with LVEF 35 to 40%, started on Entresto.  Echocardiogram 03/2022 with LVEF 40 to 45%, grade 1 DD) see, moderate mitral regurgitation by echo 11/2021 and mild 03/2022, hypertension, hyperlipidemia, glucose intolerance, GERD/PUD, iron deficiency anemia, aortic atherosclerosis presents today for follow-up visit.  I saw him March 2024, he states that recently in the ED.  He had to clean a  knife and sliced his finger.  He got 3 stitches.  No cardiac issues at that time.  He missed his appointment with Dr. Eldridge Dace [place]  Right now he has a cold and went to see his primary care yesterday.  He is taking over-the-counter medications and trying to stay hydrated.  Tested for flu and COVID and was negative. No chest pain. Allergies are his biggest issue. He went to see an ENT doc and nothing was found.  Reports no chest pain, pressure, or tightness. No edema, orthopnea, PND. Reports no palpitations.   Today, he presents with a history of heart disease and recent knee replacement,  with residual cough and fatigue following a prolonged illness. He reports a recent increase in exercise, which has led to some shortness of breath, but attributes this to deconditioning. He also reports feeling 'wobbly' when walking, but has plans to address this with exercise at a local gym.  In addition to these symptoms, the patient has been dealing with the aftermath of a knee replacement  surgery. He reports a complication post-surgery where he developed an infection in his colon due to constipation from pain medication. This required a hospital stay and IV antibiotics. He has since recovered and reports normal bowel movements.  The patient also mentions plans to travel to [place] in the future, indicating a desire for improved health and mobility.  Reports no shortness of breath nor dyspnea on exertion. Reports no chest pain, pressure, or tightness. No edema, orthopnea, PND. Reports no palpitations.   Discussed the use of AI scribe software for clinical note transcription with the patient, who gave verbal consent to proceed.   Past Medical History    Past Medical History:  Diagnosis Date   ALLERGIC RHINITIS 07/13/2007   Aorta dilated on Echo; Normal sized Aorta on CT    Chest CT 1/23: No thoracic aortic aneurysm; coronary calcifications; aortic atherosclerosis; improved aeration of lungs with persistent minimal reticular opacities   Arthritis    Blood transfusion without reported diagnosis    CAD (coronary artery disease)    CHF (congestive heart failure) (HCC)    Clotting disorder (HCC)    Diverticulitis    GERD (gastroesophageal reflux disease)    GI bleed    Glaucoma    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)    HYPERLIPIDEMIA 07/13/2007   HYPERTENSION 07/13/2007   Impaired glucose tolerance 09/27/2016   Iron deficiency anemia due to chronic blood loss 02/27/2021   NEPHROLITHIASIS, 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   NSVT (nonsustained ventricular  tachycardia) (HCC)    PEPTIC ULCER DISEASE 07/13/2007   PVC's (premature ventricular contractions)    Sleep apnea    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   Past Surgical History:  Procedure Laterality Date   BROW LIFT Bilateral 11/10/2020   Procedure: BLEPHAROPLASTY;  Surgeon: Allena Napoleon, MD;  Location: [facility];  Service: Plastics;  Laterality: Bilateral;  1 hour   COLONOSCOPY N/A  04/12/2022   Procedure: COLONOSCOPY;  Surgeon: Willis Modena, MD;  Location: [facility];  Service: Gastroenterology;  Laterality: N/A;   COLONOSCOPY WITH PROPOFOL N/A 02/17/2019   Procedure: COLONOSCOPY WITH PROPOFOL;  Surgeon: [name], MD;  Location: [facility];  Service: Gastroenterology;  Laterality: N/A;   CORONARY STENT INTERVENTION N/A 08/25/2021   Procedure: CORONARY STENT INTERVENTION;  Surgeon: Corky Crafts, MD;  Location: [facility] INVASIVE CV LAB;  Service: Cardiovascular;  Laterality: N/A;   CORONARY ULTRASOUND/IVUS N/A 08/25/2021   Procedure: Intravascular Ultrasound/IVUS;  Surgeon: Corky Crafts, MD;  Location: [facility] INVASIVE CV LAB;  Service: Cardiovascular;  Laterality: N/A;   EYE SURGERY     HYDROCELE EXCISION Right 05/15/2003   IR ANGIOGRAM VISCERAL SELECTIVE  02/16/2019   IR ANGIOGRAM VISCERAL SELECTIVE  02/16/2019   IR ANGIOGRAM VISCERAL SELECTIVE  02/16/2019   IR US GUIDE VASC ACCESS RIGHT  02/16/2019   LEFT HEART CATH AND CORONARY ANGIOGRAPHY N/A 07/28/2021   Procedure: LEFT HEART CATH AND CORONARY ANGIOGRAPHY;  Surgeon: Corky Crafts, MD;  Location: [facility] INVASIVE CV LAB;  Service: Cardiovascular;  Laterality: N/A;   LEFT HEART CATH AND CORONARY ANGIOGRAPHY N/A 08/25/2021   Procedure: LEFT HEART CATH AND CORONARY ANGIOGRAPHY;  Surgeon: Corky Crafts, MD;  Location: [facility] INVASIVE CV LAB;  Service: Cardiovascular;  Laterality: N/A;   PERIPHERAL INTRAVASCULAR LITHOTRIPSY  08/25/2021   Procedure: INTRAVASCULAR LITHOTRIPSY;  Surgeon: Corky Crafts, MD;  Location: [facility] INVASIVE CV LAB;  Service: Cardiovascular;;   ROTATOR CUFF REPAIR     SPERMATOCELECTOMY Right 05/15/2003   TOTAL KNEE ARTHROPLASTY Left 07/11/2023   Procedure: TOTAL KNEE ARTHROPLASTY;  Surgeon: Ollen Gross, MD;  Location: [facility];  Service: Orthopedics;  Laterality: Left;    Allergies  No Known Allergies EKGs/Labs/Other Studies Reviewed:   The following studies were  reviewed today:  Monitor 11/2021 Normal sinus rhythm with rare SVT and NSVT. Frequent PVCs with rare PACs noted. No symptoms associated with premature beats. No atrial fibrillation.     Patch Wear Time:  2 days and 23 hours (2023-01-20T10:05:48-0500 to 2023-01-23T09:20:41-0500)   Patient had a min HR of 55 bpm, max HR of 226 bpm, and avg HR of 80 bpm. Predominant underlying rhythm was Sinus Rhythm. 14 Ventricular Tachycardia runs occurred, the run with the fastest interval lasting 4 beats with a max rate of 226 bpm, the longest  lasting 12 beats with an avg rate of 127 bpm. 10 Supraventricular Tachycardia runs occurred, the run with the fastest interval lasting 14 beats with a max rate of 184 bpm, the longest lasting 16 beats with an avg rate of 96 bpm. Isolated SVEs were rare  (<1.0%), SVE Couplets were rare (<1.0%), and SVE Triplets were rare (<1.0%). Isolated VEs were frequent (7.1%, 24152), VE Couplets were rare (<1.0%, 769), and VE Triplets were rare (<1.0%, 51). Ventricular Bigeminy and Trigeminy were present.   Echo 03/2022 IMPRESSIONS    1. Left ventricular ejection fraction, by estimation, is 40 to 45%. The left ventricle has mildly decreased function. The left ventricle demonstrates global hypokinesis. There is mild  asymmetric left ventricular hypertrophy. Left ventricular diastolic parameters are consistent with Grade I diastolic dysfunction (impaired relaxation).  2. Right ventricular systolic function is normal. The right ventricular size is normal.  3. The mitral valve is normal in structure. Mild mitral valve regurgitation. No evidence of mitral stenosis.  4. The aortic valve is tricuspid. Aortic valve regurgitation is not visualized. Aortic valve sclerosis/calcification is present, without any evidence of aortic stenosis.  5. The inferior vena cava is normal in size with greater than 50% respiratory variability, suggesting right atrial pressure of 3  mmHg.  Comparison(s): The left ventricular function has improved slighlty in 11/2021 was 35-40% now 40-45%.  FINDINGS  Left Ventricle: Left ventricular ejection fraction, by estimation, is 40 to 45%. The left ventricle has mildly decreased function. The left ventricle demonstrates global hypokinesis. The left ventricular internal cavity size was normal in size. There is  mild asymmetric left ventricular hypertrophy. Left ventricular diastolic parameters are consistent with Grade I diastolic dysfunction (impaired relaxation).  Right Ventricle: The right ventricular size is normal. No increase in right ventricular wall thickness. Right ventricular systolic function is normal.  Left Atrium: Left atrial size was normal in size.  Right Atrium: Right atrial size was normal in size.  Pericardium: There is no evidence of pericardial effusion. Presence of epicardial fat layer.  Mitral Valve: The mitral valve is normal in structure. Mild mitral valve regurgitation. No evidence of mitral valve stenosis.  Tricuspid Valve: The tricuspid valve is normal in structure. Tricuspid valve regurgitation is trivial. No evidence of tricuspid stenosis.  Aortic Valve: The aortic valve is tricuspid. Aortic valve regurgitation is not visualized. Aortic valve sclerosis/calcification is present, without any evidence of aortic stenosis. Aortic valve mean gradient measures 8.0 mmHg. Aortic valve peak gradient measures 15.2 mmHg. Aortic valve area, by VTI measures 2.02 cm.  Pulmonic Valve: The pulmonic valve was normal in structure. Pulmonic valve regurgitation is not visualized. No evidence of pulmonic stenosis.  Aorta: The aortic root is normal in size and structure.  Venous: The inferior vena cava is normal in size with greater than 50% respiratory variability, suggesting right atrial pressure of 3 mmHg.  IAS/Shunts: No atrial level shunt detected by color flow Doppler.    EKG:  EKG is not  ordered today.   Recent Labs: 08/02/2023: Magnesium 1.8 11/07/2023: NT-Pro BNP 167 11/10/2023: ALT 22; B Natriuretic Peptide 53.2; BUN 25; Creatinine, Ser 0.99; Hemoglobin 13.0; Platelets 322; Potassium 3.2; Sodium 135  Recent Lipid Panel    Component Value Date/Time   CHOL 121 10/26/2022 1018   CHOL 114 12/09/2021 0838   TRIG 170.0 (H) 10/26/2022 1018   HDL 43.30 10/26/2022 1018   HDL 42 12/09/2021 0838   CHOLHDL 3 10/26/2022 1018   VLDL 34.0 10/26/2022 1018   LDLCALC 44 10/26/2022 1018   LDLCALC 41 12/09/2021 0838   LDLDIRECT 129.0 02/27/2021 0950    Home Medications   Current Meds  Medication Sig   albuterol (PROVENTIL) (2.5 MG/3ML) 0.083% nebulizer solution Take 3 mLs (2.5 mg total) by nebulization every 6 (six) hours as needed for wheezing or shortness of breath.   albuterol (VENTOLIN HFA) 108 (90 Base) MCG/ACT inhaler Inhale 1-2 puffs into the lungs every 6 (six) hours as needed.   Ascorbic Acid (VITAMIN C) 1000 MG tablet Take 1,000 mg by mouth 2 (two) times a week.   Cyanocobalamin (B-12 PO) Take 1 tablet by mouth daily.   furosemide (LASIX) 20 MG tablet Take 1 tablet (20 mg total) by  mouth daily.   hydrochlorothiazide (HYDRODIURIL) 25 MG tablet TAKE 1 TABLET BY MOUTH DAILY   latanoprost (XALATAN) 0.005 % ophthalmic solution Place 1 drop into both eyes at bedtime.   MAGNESIUM PO Take 400 mg by mouth at bedtime.   methocarbamol (ROBAXIN) 500 MG tablet Take 1 tablet (500 mg total) by mouth every 6 (six) hours as needed for muscle spasms.   Misc. Devices MISC Nebulizer machine with tubing   nitroGLYCERIN (NITROSTAT) 0.4 MG SL tablet Place 1 tablet (0.4 mg total) under the tongue every 5 (five) minutes as needed for chest pain.   ondansetron (ZOFRAN) 4 MG tablet Take 1 tablet (4 mg total) by mouth every 6 (six) hours as needed for nausea.   pantoprazole (PROTONIX) 40 MG tablet Take 1 tablet (40 mg total) by mouth daily.   Polyvinyl Alcohol-Povidone (REFRESH OP) Place 1 drop into  the left eye 2 (two) times daily as needed (dryness).   psyllium (METAMUCIL) 58.6 % powder Take 1 packet by mouth daily.   rosuvastatin (CRESTOR) 20 MG tablet Take 1 tablet by mouth once daily   tamsulosin (FLOMAX) 0.4 MG CAPS capsule TAKE 1 CAPSULE BY MOUTH DAILY   valsartan (DIOVAN) 320 MG tablet Take 1 tablet by mouth once daily   zinc gluconate 50 MG tablet Take 50 mg by mouth daily.     Review of Systems      All other systems reviewed and are otherwise negative except as noted above.  Physical Exam    VS:  BP 122/80   Pulse 76   Ht 5' 4.5" (1.638 m)   Wt 164 lb 9.6 oz (74.7 kg)   SpO2 96%   BMI 27.82 kg/m  , BMI Body mass index is 27.82 kg/m.  Wt Readings from Last 3 Encounters:  01/25/24 164 lb 9.6 oz (74.7 kg)  01/16/24 163 lb 12.8 oz (74.3 kg)  01/12/24 163 lb (73.9 kg)     GEN: Well nourished, well developed, in no acute distress. HEENT: normal. Neck: Supple, no JVD, carotid bruits, or masses. Cardiac: RRR, no murmurs, rubs, or gallops. No clubbing, cyanosis, edema.  Radials/PT 2+ and equal bilaterally.  Respiratory:  Respirations regular and unlabored, clear to auscultation bilaterally. GI: Soft, nontender, nondistended. MS: No deformity or atrophy. Skin: Warm and dry, no rash. Neuro:  Strength and sensation are intact. Psych: Normal affect.  Assessment & Plan    Recent Illness Recovering from a prolonged illness with associated fatigue and deconditioning. Recently resumed exercise with some shortness of breath, likely due to deconditioning. -Encouraged continued gradual increase in exercise as tolerated. -update an echo  Knee Osteoarthritis Left knee replacement with good recovery and function. Right knee planned for gel injections due to successful past experience. -Continue with planned gel injections for right knee.  Hypokalemia Recent episode of low potassium, treated with supplementation and dietary changes. -Continue current management and  monitor potassium levels as needed.  Cardiac Health, CAD No recent chest pain or significant shortness of breath. EKG normal, but echocardiogram not updated recently. -Order echocardiogram to assess cardiac function.  General Health Maintenance Recent lab work showed borderline low hemoglobin, improved from previous. -Monitor hemoglobin levels as part of routine follow-up.  No chest pain, CAD -Continue medications which grams, HCTZ 25 mg daily, metoprolol succinate 25 mg daily, nitro as needed, Crestor 20 mg daily, Diovan 320 mg daily -No chest pain, he does have shortness of breath but he has a URI currently  HTN -Blood pressure well-controlled -Continue current  medication regimen -Continue to monitor blood pressure at home  HLD -Lab work was done November 2023 which showed LDL 44, HDL 43, triglycerides 170 -He is taking Crestor 20 mg once a day. -Would consider adding fenofibrate EITHER for better triglyceride control at next apt       Disposition: Follow up 1 year with me   Signed, Sharlene Dory, PA-C 01/25/2024, 11:45 AM [facility]

## 2024-01-25 ENCOUNTER — Encounter: Payer: Self-pay | Admitting: Physician Assistant

## 2024-01-25 ENCOUNTER — Ambulatory Visit: Payer: Medicare Other | Attending: Physician Assistant | Admitting: Physician Assistant

## 2024-01-25 VITALS — BP 122/80 | HR 76 | Ht 64.5 in | Wt 164.6 lb

## 2024-01-25 DIAGNOSIS — E785 Hyperlipidemia, unspecified: Secondary | ICD-10-CM

## 2024-01-25 DIAGNOSIS — R0609 Other forms of dyspnea: Secondary | ICD-10-CM | POA: Diagnosis not present

## 2024-01-25 DIAGNOSIS — I10 Essential (primary) hypertension: Secondary | ICD-10-CM

## 2024-01-25 DIAGNOSIS — I5020 Unspecified systolic (congestive) heart failure: Secondary | ICD-10-CM

## 2024-01-25 DIAGNOSIS — I2510 Atherosclerotic heart disease of native coronary artery without angina pectoris: Secondary | ICD-10-CM

## 2024-01-25 MED ORDER — HYDROCHLOROTHIAZIDE 25 MG PO TABS: 25.0000 mg | ORAL_TABLET | Freq: Every day | ORAL | 3 refills | Status: AC

## 2024-01-25 NOTE — Patient Instructions (Signed)
 Testing/Procedures: ECHO Your physician has requested that you have an echocardiogram. Echocardiography is a painless test that uses sound waves to create images of your heart. It provides your doctor with information about the size and shape of your heart and how well your heart's chambers and valves are working. This procedure takes approximately one hour. There are no restrictions for this procedure. Please do NOT wear cologne, perfume, aftershave, or lotions (deodorant is allowed). Please arrive 15 minutes prior to your appointment time.  Please note: We ask at that you not bring children with you during ultrasound (echo/ vascular) testing. Due to room size and safety concerns, children are not allowed in the ultrasound rooms during exams. Our front office staff cannot provide observation of children in our lobby area while testing is being conducted. An adult accompanying a patient to their appointment will only be allowed in the ultrasound room at the discretion of the ultrasound technician under special circumstances. We apologize for any inconvenience.   Follow-Up: At [facility], you and your health needs are our priority.  As part of our continuing mission to provide you with exceptional heart care, we have created designated Provider Care Teams.  These Care Teams include your primary Cardiologist (physician) and Advanced Practice Providers (APPs -  Physician Assistants and Nurse Practitioners) who all work together to provide you with the care you need, when you need it.  We recommend signing up for the patient portal called "MyChart".  Sign up information is provided on this After Visit Summary.  MyChart is used to connect with patients for Virtual Visits (Telemedicine).  Patients are able to view lab/test results, encounter notes, upcoming appointments, etc.  Non-urgent messages can be sent to your provider as well.   To learn more about what you can do with MyChart, go to  [email or web address].    Your next appointment:   1 year(s)  Provider:   Jari Favre, PA  Other Instructions   1st Floor: - Lobby - Registration  - Pharmacy  - Lab - Cafe  2nd Floor: - PV Lab - Diagnostic Testing (echo, CT, nuclear med)  3rd Floor: - Vacant  4th Floor: - TCTS (cardiothoracic surgery) - AFib Clinic - Structural Heart Clinic - Vascular Surgery  - Vascular Ultrasound  5th Floor: - HeartCare Cardiology (general and EP) - Clinical Pharmacy for coumadin, hypertension, lipid, weight-loss medications, and med management appointments    Valet parking services will be available as well.

## 2024-01-26 ENCOUNTER — Other Ambulatory Visit: Payer: Self-pay | Admitting: Family Medicine

## 2024-01-30 ENCOUNTER — Other Ambulatory Visit: Payer: Self-pay | Admitting: Family Medicine

## 2024-01-30 DIAGNOSIS — E785 Hyperlipidemia, unspecified: Secondary | ICD-10-CM

## 2024-02-01 DIAGNOSIS — M1711 Unilateral primary osteoarthritis, right knee: Secondary | ICD-10-CM | POA: Diagnosis not present

## 2024-02-09 DIAGNOSIS — M1711 Unilateral primary osteoarthritis, right knee: Secondary | ICD-10-CM | POA: Diagnosis not present

## 2024-02-14 ENCOUNTER — Encounter: Payer: Self-pay | Admitting: Family Medicine

## 2024-02-14 ENCOUNTER — Ambulatory Visit (INDEPENDENT_AMBULATORY_CARE_PROVIDER_SITE_OTHER): Payer: Medicare Other | Admitting: Family Medicine

## 2024-02-14 VITALS — BP 110/68 | HR 66 | Temp 97.8°F | Ht 64.5 in | Wt 168.8 lb

## 2024-02-14 DIAGNOSIS — I5022 Chronic systolic (congestive) heart failure: Secondary | ICD-10-CM

## 2024-02-14 DIAGNOSIS — I2510 Atherosclerotic heart disease of native coronary artery without angina pectoris: Secondary | ICD-10-CM

## 2024-02-14 DIAGNOSIS — Z0000 Encounter for general adult medical examination without abnormal findings: Secondary | ICD-10-CM | POA: Diagnosis not present

## 2024-02-14 DIAGNOSIS — R200 Anesthesia of skin: Secondary | ICD-10-CM | POA: Diagnosis not present

## 2024-02-14 DIAGNOSIS — E785 Hyperlipidemia, unspecified: Secondary | ICD-10-CM | POA: Diagnosis not present

## 2024-02-14 DIAGNOSIS — I10 Essential (primary) hypertension: Secondary | ICD-10-CM

## 2024-02-14 LAB — COMPREHENSIVE METABOLIC PANEL
ALT: 16 U/L (ref 0–53)
AST: 18 U/L (ref 0–37)
Albumin: 4.5 g/dL (ref 3.5–5.2)
Alkaline Phosphatase: 43 U/L (ref 39–117)
BUN: 25 mg/dL — ABNORMAL HIGH (ref 6–23)
CO2: 25 meq/L (ref 19–32)
Calcium: 8.9 mg/dL (ref 8.4–10.5)
Chloride: 99 meq/L (ref 96–112)
Creatinine, Ser: 1.02 mg/dL (ref 0.40–1.50)
GFR: 68.52 mL/min (ref >60.00)
Glucose, Bld: 102 mg/dL — ABNORMAL HIGH (ref 70–99)
Potassium: 4.1 meq/L (ref 3.5–5.1)
Sodium: 133 meq/L — ABNORMAL LOW (ref 135–145)
Total Bilirubin: 0.4 mg/dL (ref 0.2–1.2)
Total Protein: 6.6 g/dL (ref 6.0–8.3)

## 2024-02-14 LAB — LIPID PANEL
Cholesterol: 126 mg/dL (ref 0–200)
HDL: 56 mg/dL (ref >39.00)
LDL Cholesterol: 49 mg/dL (ref 0–99)
NonHDL: 70.13
Total CHOL/HDL Ratio: 2
Triglycerides: 104 mg/dL (ref 0.0–149.0)
VLDL: 20.8 mg/dL (ref 0.0–40.0)

## 2024-02-14 LAB — FOLATE: Folate: 15.1 ng/mL (ref >5.9)

## 2024-02-14 LAB — VITAMIN B12: Vitamin B-12: 1022 pg/mL — ABNORMAL HIGH (ref 211–911)

## 2024-02-14 LAB — TSH: TSH: 2.91 u[IU]/mL (ref 0.35–5.50)

## 2024-02-14 NOTE — Assessment & Plan Note (Signed)
 Stable. No current angina. Continue aspirin 81 mg daily.

## 2024-02-14 NOTE — Assessment & Plan Note (Signed)
 I will check lipids today. Continue rosuvastatin 20 mg daily.

## 2024-02-14 NOTE — Assessment & Plan Note (Signed)
 Blood pressure is in adequate control. Continue hydrochlorothiazide 25 mg daily and valsartan 320 mg daily.

## 2024-02-14 NOTE — Assessment & Plan Note (Signed)
 Etiology is unclear. This may represent a spinal stenosis issue brought out by sleep position. I will check labs to screen for other potential etiologies.

## 2024-02-14 NOTE — Progress Notes (Signed)
 [facility]IMARY [facility] [address] [place] [address] Dept: [phone] Dept Fax: [phone]  Annual Physical Visit  Subjective:    [name], male    DOB: 07-Jan-1941, 83 y.o..   MRN: [id number]  Chief Complaint  Patient presents with   Annual Exam    CPE/labs.  not fasting today.  C/o bilateral feet numbness.       History of Present Illness:  Patient is in today for an annual physical/preventative visit.  David Goodman has a history of hypertension, CAD, and HFrEF. He is managed on a daily aspirin, HCTZ 25 mg daily and valsartan 320 mg daily. His elevated lipids are managed with rosuvastatin 20 mg daily.   Review of Systems  Constitutional:  Negative for chills, diaphoresis, fever, malaise/fatigue and weight loss.  HENT:  Positive for hearing loss. Negative for congestion, ear pain, sinus pain, sore throat and tinnitus.        Uses hearing aides. These are working well.  Eyes:  Negative for blurred vision, pain, discharge and redness.  Respiratory:  Positive for cough and shortness of breath. Negative for wheezing.        Had a prolonged course of bronchitis this winter. His symptoms are significantly improved, though he has some mild morning cough still at this point.   Cardiovascular:  Negative for chest pain and palpitations.  Gastrointestinal:  Positive for heartburn. Negative for abdominal pain, constipation, diarrhea, nausea and vomiting.       Manages GERD with pantoprazole 40 mg daily.  Musculoskeletal:  Positive for joint pain. Negative for back pain and myalgias.       Working with orthopedics regarding right knee pain. He has received 2 of 3 planned knee joint gel injections. He feels this has helped.  Skin:  Positive for itching and rash.       Has had a persistent issue with itching and a mild rash around his upper chest/neck area. He has tried steroid creams and made changes to his cologne/soap/lotions.  He has not found anything to help. However, he does not want to see a dermatologist.  Neurological:  Positive for sensory change.       Notes intermittent numbness in his feet. He states this occurs mostly at night. He has some occasional pain in his left great toe as well.  Psychiatric/Behavioral:  Negative for depression. The patient is not nervous/anxious.    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 09/09/2022   Gait instability 09/09/2022   Acute right-sided low back pain without sciatica 09/03/2022   Nonallergic rhinitis 08/04/2022   History of colonic polyps 07/22/2022   Gastro-esophageal reflux disease with esophagitis 07/22/2022   Gastric polyp 07/22/2022   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   GIB (gastrointestinal bleeding) 04/11/2022   OSA on CPAP 12/13/2021   Vivid dream 09/21/2021   Excessive daytime sleepiness 09/21/2021   Snoring 09/21/2021   Coronary artery disease involving native coronary artery of native  heart 09/21/2021   SOBOE (shortness of breath on exertion) 09/21/2021   HFrEF (heart failure with reduced ejection fraction) (HCC)    Bell's palsy, left 06/05/2021   Cerebrovascular disease 06/05/2021   Hiatal hernia 05/29/2021   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   Erectile dysfunction due to arterial insufficiency 02/27/2021   Persistent cough for 3 weeks or  longer 12/17/2020   Hemorrhoids 07/17/2020   Syncope 11/15/2018   Diverticulitis 05/25/2018   Osteoarthritis of right knee 12/13/2017   OA (osteoarthritis) of knee 12/13/2017   Testosterone deficiency 03/22/2017   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/27/2016   BPH (benign prostatic hyperplasia) 06/18/2013   HLD (hyperlipidemia) 07/13/2007   Essential hypertension 07/13/2007   Allergic rhinitis 07/13/2007   Peptic ulcer disease 07/13/2007   Chest pain of uncertain etiology 07/13/2007   History of nephrolithiasis 07/13/2007   Past Surgical History:  Procedure Laterality Date   BROW LIFT Bilateral 11/10/2020   Procedure: BLEPHAROPLASTY;  Surgeon: Allena Napoleon, MD;  Location: [facility];  Service: Plastics;  Laterality: Bilateral;  1 hour   COLONOSCOPY N/A 04/12/2022   Procedure: COLONOSCOPY;  Surgeon: Willis Modena, MD;  Location: [facility];  Service: Gastroenterology;  Laterality: N/A;   COLONOSCOPY WITH PROPOFOL N/A 02/17/2019   Procedure: COLONOSCOPY WITH PROPOFOL;  Surgeon: [name], MD;  Location: [facility];  Service: Gastroenterology;  Laterality: N/A;   CORONARY STENT INTERVENTION N/A 08/25/2021   Procedure: CORONARY STENT INTERVENTION;  Surgeon: Corky Crafts, MD;  Location: [facility] INVASIVE CV LAB;  Service: Cardiovascular;  Laterality: N/A;   CORONARY ULTRASOUND/IVUS N/A 08/25/2021   Procedure: Intravascular Ultrasound/IVUS;  Surgeon: Corky Crafts, MD;  Location: [facility] INVASIVE CV LAB;  Service: Cardiovascular;  Laterality: N/A;   EYE SURGERY     HYDROCELE EXCISION Right 05/15/2003   IR ANGIOGRAM VISCERAL SELECTIVE  02/16/2019   IR ANGIOGRAM VISCERAL SELECTIVE  02/16/2019   IR ANGIOGRAM VISCERAL SELECTIVE  02/16/2019   IR US GUIDE VASC ACCESS RIGHT  02/16/2019   JOINT REPLACEMENT     LEFT HEART CATH AND CORONARY ANGIOGRAPHY N/A 07/28/2021   Procedure: LEFT HEART CATH AND CORONARY ANGIOGRAPHY;  Surgeon: Corky Crafts, MD;  Location: [facility]  INVASIVE CV LAB;  Service: Cardiovascular;  Laterality: N/A;   LEFT HEART CATH AND CORONARY ANGIOGRAPHY N/A 08/25/2021   Procedure: LEFT HEART CATH AND CORONARY ANGIOGRAPHY;  Surgeon: Corky Crafts, MD;  Location: [facility] INVASIVE CV LAB;  Service: Cardiovascular;  Laterality: N/A;   PERIPHERAL INTRAVASCULAR LITHOTRIPSY  08/25/2021   Procedure: INTRAVASCULAR LITHOTRIPSY;  Surgeon: Corky Crafts, MD;  Location: [facility] INVASIVE CV LAB;  Service: Cardiovascular;;   ROTATOR CUFF REPAIR     SPERMATOCELECTOMY Right 05/15/2003   TOTAL KNEE ARTHROPLASTY Left 07/11/2023   Procedure: TOTAL KNEE ARTHROPLASTY;  Surgeon: Ollen Gross, MD;  Location: [facility];  Service: Orthopedics;  Laterality: Left;   Family History  Problem Relation Age of Onset   Cancer Brother    Outpatient Medications Prior to Visit  Medication Sig Dispense Refill   albuterol (PROVENTIL) (2.5 MG/3ML) 0.083% nebulizer solution Take 3 mLs (2.5 mg total) by nebulization every 6 (six) hours as needed for wheezing or shortness of breath.     albuterol (VENTOLIN HFA) 108 (90 Base) MCG/ACT inhaler Inhale 1-2 puffs into the lungs every 6 (six) hours as needed. 8 g 2   Ascorbic Acid (VITAMIN C) 1000 MG tablet Take 1,000 mg by mouth 2 (two) times a week.  Cyanocobalamin (B-12 PO) Take 1 tablet by mouth daily.     hydrochlorothiazide (HYDRODIURIL) 25 MG tablet Take 1 tablet (25 mg total) by mouth daily. 90 tablet 3   latanoprost (XALATAN) 0.005 % ophthalmic solution Place 1 drop into both eyes at bedtime. 7.5 mL 1   MAGNESIUM PO Take 400 mg by mouth at bedtime.     Misc. Devices MISC Nebulizer machine with tubing 1 each 0   nitroGLYCERIN (NITROSTAT) 0.4 MG SL tablet Place 1 tablet (0.4 mg total) under the tongue every 5 (five) minutes as needed for chest pain. 25 tablet 3   pantoprazole (PROTONIX) 40 MG tablet Take 1 tablet (40 mg total) by mouth daily. 90 tablet 2   Polyvinyl Alcohol-Povidone (REFRESH OP) Place 1 drop into the left  eye 2 (two) times daily as needed (dryness).     psyllium (METAMUCIL) 58.6 % powder Take 1 packet by mouth daily.     rosuvastatin (CRESTOR) 20 MG tablet Take 1 tablet by mouth once daily 90 tablet 0   tamsulosin (FLOMAX) 0.4 MG CAPS capsule TAKE 1 CAPSULE BY MOUTH DAILY 90 capsule 3   valsartan (DIOVAN) 320 MG tablet Take 1 tablet by mouth once daily 90 tablet 1   zinc gluconate 50 MG tablet Take 50 mg by mouth daily.     furosemide (LASIX) 20 MG tablet Take 1 tablet (20 mg total) by mouth daily. (Patient not taking: Reported on 02/14/2024) 30 tablet 0   methocarbamol (ROBAXIN) 500 MG tablet Take 1 tablet (500 mg total) by mouth every 6 (six) hours as needed for muscle spasms. (Patient not taking: Reported on 02/14/2024) 40 tablet 0   ondansetron (ZOFRAN) 4 MG tablet Take 1 tablet (4 mg total) by mouth every 6 (six) hours as needed for nausea. 20 tablet 0   No facility-administered medications prior to visit.   No Known Allergies Objective:   Today's Vitals   02/14/24 1010  BP: 110/68  Pulse: 66  Temp: 97.8 F (36.6 C)  TempSrc: Temporal  SpO2: 97%  Weight: 168 lb 12.8 oz (76.6 kg)  Height: 5' 4.5" (1.638 m)   Body mass index is 28.53 kg/m.   General: Well developed, well nourished. No acute distress. HEENT: Normocephalic, non-traumatic. PERRL, EOMI. Conjunctiva clear. External ears normal. EAC and TMs   normal bilaterally. Nose clear without congestion or rhinorrhea. Mucous membranes moist. Oropharynx clear. Good   dentition. Neck: Supple. No lymphadenopathy. No thyromegaly. Lungs: Mild coarsening of breath sounds bilaterally. No wheezing, rales or rhonchi. CV: RRR without murmurs or rubs, but with an occasional skip beat. Pulses 2+ bilaterally. Abdomen: Soft, non-tender. Moderate diastasis. Bowel sounds positive, normal pitch and frequency. No   hepatosplenomegaly. No rebound or guarding. Back: Straight. No CVA tenderness bilaterally. Extremities: Full ROM. No joint swelling  or tenderness. No edema noted. Skin: Warm and dry. Rash at neck is indistinct. Psych: Alert and oriented. Normal mood and affect.  There are no preventive care reminders to display for this patient.    Assessment & Plan:   Problem List Items Addressed This Visit       Cardiovascular and Mediastinum   Chronic systolic heart failure (HCC)   Compensated. Has pending echocardiogram. Continue valsartan 320 mg daily.      Relevant Orders   Comprehensive metabolic panel   Coronary artery disease involving native coronary artery of native heart   Stable. No current angina. Continue aspirin 81 mg daily.      Essential hypertension   Blood  pressure is in adequate control. Continue hydrochlorothiazide 25 mg daily and valsartan 320 mg daily.       Relevant Orders   Comprehensive metabolic panel     Other   HLD (hyperlipidemia)   I will check lipids today. Continue rosuvastatin 20 mg daily.      Relevant Orders   Lipid panel   Numbness in feet   Etiology is unclear. This may represent a spinal stenosis issue brought out by sleep position. I will check labs to screen for other potential etiologies.      Relevant Orders   Comprehensive metabolic panel   TSH   Vitamin B12   Folate   Vitamin B1   Other Visit Diagnoses       Annual physical exam    -  Primary   Overall fair health.  Recommend he maintain activity as best he can. UTD on immunizations.       Return in about 3 months (around 05/16/2024) for Reassessment.   Loyola Mast, MD

## 2024-02-14 NOTE — Assessment & Plan Note (Signed)
 Compensated. Has pending echocardiogram. Continue valsartan 320 mg daily.

## 2024-02-16 DIAGNOSIS — M1711 Unilateral primary osteoarthritis, right knee: Secondary | ICD-10-CM | POA: Diagnosis not present

## 2024-02-20 ENCOUNTER — Ambulatory Visit (HOSPITAL_COMMUNITY): Payer: Medicare Other | Attending: Physician Assistant

## 2024-02-20 DIAGNOSIS — R0609 Other forms of dyspnea: Secondary | ICD-10-CM | POA: Insufficient documentation

## 2024-02-20 LAB — ECHOCARDIOGRAM COMPLETE
AR max vel: 1.75 cm2
AV Area VTI: 1.81 cm2
AV Area mean vel: 1.68 cm2
AV Mean grad: 8 mmHg
AV Peak grad: 15.5 mmHg
Ao pk vel: 1.97 m/s
Area-P 1/2: 3.29 cm2
P 1/2 time: 452 ms
S' Lateral: 3.75 cm

## 2024-02-20 LAB — VITAMIN B1: Vitamin B1 (Thiamine): 25 nmol/L (ref 8–30)

## 2024-02-21 ENCOUNTER — Encounter: Payer: Self-pay | Admitting: Physician Assistant

## 2024-03-30 ENCOUNTER — Other Ambulatory Visit: Payer: Self-pay | Admitting: Interventional Cardiology

## 2024-05-04 ENCOUNTER — Encounter: Payer: Self-pay | Admitting: Nurse Practitioner

## 2024-05-04 ENCOUNTER — Ambulatory Visit (INDEPENDENT_AMBULATORY_CARE_PROVIDER_SITE_OTHER): Admitting: Nurse Practitioner

## 2024-05-04 VITALS — BP 112/64 | HR 74 | Temp 97.9°F | Ht 64.0 in | Wt 173.0 lb

## 2024-05-04 DIAGNOSIS — L209 Atopic dermatitis, unspecified: Secondary | ICD-10-CM

## 2024-05-04 MED ORDER — METHYLPREDNISOLONE SODIUM SUCC 40 MG IJ SOLR
20.0000 mg | Freq: Once | INTRAMUSCULAR | Status: AC
  Administered 2024-05-04: 20 mg via INTRAMUSCULAR

## 2024-05-04 MED ORDER — TRIAMCINOLONE ACETONIDE 0.1 % EX CREA: 1.0000 | TOPICAL_CREAM | Freq: Two times a day (BID) | CUTANEOUS | 0 refills | Status: DC

## 2024-05-04 NOTE — Assessment & Plan Note (Addendum)
 On neck, chest and bilateral upper arms First noted by pcp 2months ago, reports rash and itching is worse in last 5days. Denies any change of personal products or new medication, no outdoor activity, no pet at home, no yard work No fever/chills, no swollen lymph nodes, no joint pain.  Possible allergic reaction? Administered solumedrol 40mg  IM x1 and sent triamcinolone  cream. Advised to also start zyrtec  10mg  daily. Consider referral to dermatology if no improvement

## 2024-05-04 NOTE — Patient Instructions (Addendum)
 Start zyrtec  10mg  daily OVER THE COUNTER and triamcinolone  cream If no improvement, you will need referral to dermatology  Inflamed Skin (Atopic Dermatitis): What to Know Atopic dermatitis is a skin condition that causes dry, itchy, and inflamed skin. It's the most common type of eczema, which is a group of skin conditions that make your skin feel rough and puffy. This condition often gets worse in the winter and better in the summer. Atopic dermatitis usually starts in childhood and can last into adulthood. It's not contagious, so it does not spread from person to person. Your symptoms may get worse when you're having a flare-up. During a flare-up, your symptoms may get worse and bother you. What are the causes? The exact cause of this condition isn't known. Flare-ups can be triggered by: Contact with things you're sensitive or allergic to. Stress. Some foods. Very hot or cold weather. Harsh chemicals and soaps. Dry air. Chlorine. What increases the risk? You're more likely to get this condition if you have a personal or family history of: Eczema. Allergies. Asthma. Hay fever. What are the signs or symptoms?  Dry, scaly skin. Red, brown, purple, or grayish rash. Itchiness. Thick and cracked skin over time. How is this diagnosed? This condition is diagnosed based on: Symptoms. Physical exam. Medical history. How is this treated? There's no cure for this condition, but you can manage your symptoms. Do this by: Controlling your itchiness and scratching with antihistamine medicine or steroid creams. Avoiding allergens or triggers. Managing stress. Trying light therapy, also called phototherapy if other treatments don't work or if it's all over your body. Follow these instructions at home: Skin care  Keep your skin hydrated. To do this: Use unscented lotions that contain petroleum. Avoid lotions with alcohol  or water . These can dry out your skin more. Take short baths or  showers (less than 5 minutes). Use warm water  instead of hot water . Use mild, unscented soaps. Avoid bubble bath. Put lotion on right after bathing. Do not put anything on your skin without checking with your health care provider. General instructions Take or apply your medicines only as told. Wear clothes made of cotton or cotton blends. Dress lightly to avoid itching that can be caused by heat. When doing laundry, rinse your clothes twice to remove all soap. Use soap that doesn't have dyes and perfumes. Avoid triggers that cause flare-ups. Avoid scratching. It can make the rash and itching worse and can lead to infection. Keep fingernails short to avoid scratching open the skin. Avoid people who have cold sores or fever blisters. These infections can make your condition worse. Keep all follow-up visits to make sure your treatment plan is working. Contact a health care provider if: Your itching affects your sleep. Your rash gets worse or doesn't get better after a week of treatment. You have a fever. You have a rash after being around someone with cold sores or fever blisters. You have warmth or pus in the rash area. You have soft yellow scabs in the rash area. This information is not intended to replace advice given to you by your health care provider. Make sure you discuss any questions you have with your health care provider. Document Revised: 04/19/2023 Document Reviewed: 04/19/2023 Elsevier Patient Education  2024 [employer].

## 2024-05-04 NOTE — Progress Notes (Signed)
 Acute Office Visit  Subjective:    [name], male    DOB: February 13, 1941, 83 y.o.   MRN: [id number]  Chief Complaint  Patient presents with   Rash    4-5 days rash on neck, chest, and bilateral arms. Recently change    Atopic dermatitis On neck, chest and bilateral upper arms First noted by pcp 2months ago, reports rash and itching is worse in last 5days. Denies any change of personal products or new medication, no outdoor activity, no pet at home, no yard work No fever/chills, no swollen lymph nodes, no joint pain.  Possible allergic reaction? Administered solumedrol 40mg  IM x1 and sent triamcinolone  cream. Advised to also start zyrtec  10mg  daily. Consider referral to dermatology if no improvement  Outpatient Medications Prior to Visit  Medication Sig   albuterol  (PROVENTIL ) (2.5 MG/3ML) 0.083% nebulizer solution Take 3 mLs (2.5 mg total) by nebulization every 6 (six) hours as needed for wheezing or shortness of breath.   albuterol  (VENTOLIN  HFA) 108 (90 Base) MCG/ACT inhaler Inhale 1-2 puffs into the lungs every 6 (six) hours as needed.   Ascorbic Acid (VITAMIN C) 1000 MG tablet Take 1,000 mg by mouth 2 (two) times a week.   Cyanocobalamin  (B-12 PO) Take 1 tablet by mouth daily.   hydrochlorothiazide  (HYDRODIURIL ) 25 MG tablet Take 1 tablet (25 mg total) by mouth daily.   latanoprost  (XALATAN ) 0.005 % ophthalmic solution Place 1 drop into both eyes at bedtime.   MAGNESIUM  PO Take 400 mg by mouth at bedtime.   methocarbamol  (ROBAXIN ) 500 MG tablet Take 1 tablet (500 mg total) by mouth every 6 (six) hours as needed for muscle spasms.   Misc. Devices MISC Nebulizer machine with tubing   nitroGLYCERIN  (NITROSTAT ) 0.4 MG SL tablet Place 1 tablet (0.4 mg total) under the tongue every 5 (five) minutes as needed for chest pain.   pantoprazole  (PROTONIX ) 40 MG tablet Take 1 tablet (40 mg total) by mouth daily.   Polyvinyl Alcohol -Povidone (REFRESH OP) Place 1 drop into the  left eye 2 (two) times daily as needed (dryness).   psyllium (METAMUCIL) 58.6 % powder Take 1 packet by mouth daily.   rosuvastatin  (CRESTOR ) 20 MG tablet Take 1 tablet by mouth once daily   tamsulosin  (FLOMAX ) 0.4 MG CAPS capsule TAKE 1 CAPSULE BY MOUTH DAILY   valsartan  (DIOVAN ) 320 MG tablet Take 1 tablet by mouth once daily   zinc gluconate 50 MG tablet Take 50 mg by mouth daily.   furosemide  (LASIX ) 20 MG tablet Take 1 tablet (20 mg total) by mouth daily. (Patient not taking: Reported on 02/14/2024)   No facility-administered medications prior to visit.    Reviewed past medical and social history.  Review of Systems  Skin:  Positive for rash.   Per HPI     Objective:    Physical Exam Vitals and nursing note reviewed.  Neck:     Thyroid : No thyroid  mass, thyromegaly or thyroid  tenderness.  Musculoskeletal:     Cervical back: Normal range of motion and neck supple.  Lymphadenopathy:     Cervical: No cervical adenopathy.  Skin:    Findings: Erythema and rash present. Rash is macular and scaling.       Neurological:     Mental Status: He is alert.     BP 112/64 (BP Location: Left Arm, Patient Position: Sitting, Cuff Size: Normal)   Pulse 74   Temp 97.9 F (36.6 C) (Oral)   Ht 5\' 4"  (1.626  m)   Wt 173 lb (78.5 kg)   SpO2 95%   BMI 29.70 kg/m    No results found for any visits on 05/04/24.      Assessment & Plan:   Problem List Items Addressed This Visit     Atopic dermatitis - Primary   On neck, chest and bilateral upper arms First noted by pcp 2months ago, reports rash and itching is worse in last 5days. Denies any change of personal products or new medication, no outdoor activity, no pet at home, no yard work No fever/chills, no swollen lymph nodes, no joint pain.  Possible allergic reaction? Administered solumedrol 40mg  IM x1 and sent triamcinolone  cream. Advised to also start zyrtec  10mg  daily. Consider referral to dermatology if no improvement       Relevant Medications   triamcinolone  cream (KENALOG ) 0.1 %   Meds ordered this encounter  Medications   triamcinolone  cream (KENALOG ) 0.1 %    Sig: Apply 1 Application topically 2 (two) times daily.    Dispense:  453.6 g    Refill:  0    Supervising Provider:   Christianna Cowman ALFRED [[id number]]   methylPREDNISolone  sodium succinate (SOLU-MEDROL ) 40 mg/mL injection 20 mg   Return if symptoms worsen or fail to improve.    Kathrene Parents, NP

## 2024-05-05 ENCOUNTER — Other Ambulatory Visit: Payer: Self-pay | Admitting: Family Medicine

## 2024-05-05 DIAGNOSIS — E785 Hyperlipidemia, unspecified: Secondary | ICD-10-CM

## 2024-05-31 DIAGNOSIS — H401122 Primary open-angle glaucoma, left eye, moderate stage: Secondary | ICD-10-CM | POA: Diagnosis not present

## 2024-05-31 DIAGNOSIS — H31013 Macula scars of posterior pole (postinflammatory) (post-traumatic), bilateral: Secondary | ICD-10-CM | POA: Diagnosis not present

## 2024-05-31 DIAGNOSIS — H25811 Combined forms of age-related cataract, right eye: Secondary | ICD-10-CM | POA: Diagnosis not present

## 2024-05-31 DIAGNOSIS — H401111 Primary open-angle glaucoma, right eye, mild stage: Secondary | ICD-10-CM | POA: Diagnosis not present

## 2024-05-31 DIAGNOSIS — H524 Presbyopia: Secondary | ICD-10-CM | POA: Diagnosis not present

## 2024-08-07 ENCOUNTER — Other Ambulatory Visit: Payer: Self-pay | Admitting: Nurse Practitioner

## 2024-08-27 DIAGNOSIS — S83242D Other tear of medial meniscus, current injury, left knee, subsequent encounter: Secondary | ICD-10-CM | POA: Diagnosis not present

## 2024-08-27 DIAGNOSIS — M1711 Unilateral primary osteoarthritis, right knee: Secondary | ICD-10-CM | POA: Diagnosis not present

## 2024-09-11 ENCOUNTER — Ambulatory Visit (INDEPENDENT_AMBULATORY_CARE_PROVIDER_SITE_OTHER): Admitting: Family Medicine

## 2024-09-11 ENCOUNTER — Encounter: Payer: Self-pay | Admitting: Family Medicine

## 2024-09-11 VITALS — BP 112/64 | HR 74 | Temp 97.0°F | Ht 64.0 in | Wt 176.0 lb

## 2024-09-11 DIAGNOSIS — I5022 Chronic systolic (congestive) heart failure: Secondary | ICD-10-CM

## 2024-09-11 DIAGNOSIS — Z01818 Encounter for other preprocedural examination: Secondary | ICD-10-CM

## 2024-09-11 DIAGNOSIS — N529 Male erectile dysfunction, unspecified: Secondary | ICD-10-CM | POA: Insufficient documentation

## 2024-09-11 DIAGNOSIS — M1711 Unilateral primary osteoarthritis, right knee: Secondary | ICD-10-CM

## 2024-09-11 DIAGNOSIS — B354 Tinea corporis: Secondary | ICD-10-CM

## 2024-09-11 DIAGNOSIS — E785 Hyperlipidemia, unspecified: Secondary | ICD-10-CM

## 2024-09-11 DIAGNOSIS — J301 Allergic rhinitis due to pollen: Secondary | ICD-10-CM | POA: Diagnosis not present

## 2024-09-11 DIAGNOSIS — Z23 Encounter for immunization: Secondary | ICD-10-CM | POA: Diagnosis not present

## 2024-09-11 DIAGNOSIS — I4729 Other ventricular tachycardia: Secondary | ICD-10-CM | POA: Diagnosis not present

## 2024-09-11 MED ORDER — CETIRIZINE HCL 10 MG PO TABS: 10.0000 mg | ORAL_TABLET | Freq: Every day | ORAL | 2 refills | Status: AC

## 2024-09-11 MED ORDER — TADALAFIL 10 MG PO TABS: 10.0000 mg | ORAL_TABLET | ORAL | 1 refills | Status: DC | PRN

## 2024-09-11 MED ORDER — FLUTICASONE PROPIONATE 50 MCG/ACT NA SUSP: 2.0000 | Freq: Every day | NASAL | 6 refills | Status: AC

## 2024-09-11 MED ORDER — CLOTRIMAZOLE-BETAMETHASONE 1-0.05 % EX CREA: 1.0000 | TOPICAL_CREAM | Freq: Every day | CUTANEOUS | 0 refills | Status: DC

## 2024-09-11 MED ORDER — ROSUVASTATIN CALCIUM 20 MG PO TABS: 20.0000 mg | ORAL_TABLET | Freq: Every day | ORAL | 0 refills | Status: DC

## 2024-09-11 NOTE — Assessment & Plan Note (Signed)
 No current issue with SVT. Continue to follow with cardiology.

## 2024-09-11 NOTE — Progress Notes (Signed)
 [facility] [address] [place] [address] Dept: [phone] Dept Fax: [phone]  Office Visit  Subjective:    [name], male    DOB: 03-20-1941, 83 y.o..   MRN: [id number]  Chief Complaint  Patient presents with   Pre-op Exam    Pre-op clearance.    History of Present Illness:  Patient is in today for a preoperative physical. He has been recommended to have a total right knee joint replacement secondary to advanced osteoarthritis. He ahs had a prior left total knee joint procedure. He plans to request spinal anesthesia.   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 09/09/2022   Gait instability 09/09/2022   Acute right-sided low back pain without sciatica 09/03/2022   History of colonic polyps 07/22/2022   Gastro-esophageal reflux disease with esophagitis 07/22/2022   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   OSA on CPAP 12/13/2021   Coronary artery disease involving native coronary artery of native heart 09/21/2021   SOBOE (shortness of breath on exertion) 09/21/2021   HFrEF (heart failure with reduced ejection fraction) (HCC)    Cerebrovascular disease 06/05/2021   Hiatal hernia 05/29/2021   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   Erectile dysfunction due to arterial insufficiency 02/27/2021   Hemorrhoids 07/17/2020    Syncope 11/15/2018   Diverticulitis 05/25/2018   Osteoarthritis of right knee 12/13/2017   OA (osteoarthritis) of knee 12/13/2017   Testosterone  deficiency 03/22/2017   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/27/2016   BPH (benign prostatic hyperplasia) 06/18/2013   HLD (hyperlipidemia) 07/13/2007   Essential hypertension 07/13/2007   Allergic rhinitis 07/13/2007   History of nephrolithiasis 07/13/2007   Past Surgical History:  Procedure Laterality Date   BROW LIFT Bilateral 11/10/2020   Procedure: BLEPHAROPLASTY;  Surgeon: Elisabeth Craig RAMAN, MD;  Location: [facility];  Service: Plastics;  Laterality: Bilateral;  1 hour   COLONOSCOPY N/A 04/12/2022   Procedure: COLONOSCOPY;  Surgeon: Burnette Fallow, MD;  Location: [facility];  Service: Gastroenterology;  Laterality: N/A;   COLONOSCOPY WITH PROPOFOL  N/A 02/17/2019   Procedure: COLONOSCOPY WITH PROPOFOL ;  Surgeon: Saintclair Jasper, MD;  Location: [facility];  Service: Gastroenterology;  Laterality: N/A;   CORONARY STENT INTERVENTION N/A 08/25/2021   Procedure: CORONARY STENT INTERVENTION;  Surgeon: Dann Candyce RAMAN, MD;  Location: [facility] INVASIVE CV LAB;  Service: Cardiovascular;  Laterality: N/A;   CORONARY ULTRASOUND/IVUS N/A 08/25/2021   Procedure: Intravascular Ultrasound/IVUS;  Surgeon: Dann Candyce RAMAN, MD;  Location: [facility] INVASIVE CV LAB;  Service: Cardiovascular;  Laterality: N/A;   EYE SURGERY     HYDROCELE EXCISION Right 05/15/2003   IR ANGIOGRAM VISCERAL SELECTIVE  02/16/2019   IR ANGIOGRAM VISCERAL SELECTIVE  02/16/2019   IR ANGIOGRAM VISCERAL SELECTIVE  02/16/2019   IR US  GUIDE VASC ACCESS RIGHT  02/16/2019   JOINT REPLACEMENT     LEFT HEART CATH AND CORONARY ANGIOGRAPHY N/A 07/28/2021   Procedure: LEFT HEART CATH AND CORONARY ANGIOGRAPHY;  Surgeon: Dann Candyce RAMAN, MD;  Location: [facility] INVASIVE CV LAB;  Service: Cardiovascular;  Laterality: N/A;   LEFT HEART CATH AND CORONARY ANGIOGRAPHY N/A 08/25/2021    Procedure: LEFT HEART CATH AND CORONARY ANGIOGRAPHY;  Surgeon: Dann Candyce RAMAN, MD;  Location: [facility] INVASIVE CV LAB;  Service: Cardiovascular;  Laterality: N/A;   PERIPHERAL INTRAVASCULAR LITHOTRIPSY  08/25/2021   Procedure: INTRAVASCULAR LITHOTRIPSY;  Surgeon: Dann Candyce RAMAN, MD;  Location: [facility] INVASIVE CV LAB;  Service: Cardiovascular;;   ROTATOR CUFF REPAIR     SPERMATOCELECTOMY Right 05/15/2003   TOTAL KNEE ARTHROPLASTY Left 07/11/2023   Procedure: TOTAL KNEE ARTHROPLASTY;  Surgeon: Melodi Lerner, MD;  Location: [facility];  Service: Orthopedics;  Laterality: Left;   Family History  Problem Relation Age of Onset   Cancer Brother    Outpatient Medications Prior to Visit  Medication Sig Dispense Refill   Ascorbic Acid (VITAMIN C) 1000 MG tablet Take 1,000 mg by mouth 2 (two) times a week.     hydrochlorothiazide  (HYDRODIURIL ) 25 MG tablet Take 1 tablet (25 mg total) by mouth daily. 90 tablet 3   latanoprost  (XALATAN ) 0.005 % ophthalmic solution Place 1 drop into both eyes at bedtime. 7.5 mL 1   MAGNESIUM  PO Take 400 mg by mouth at bedtime.     nitroGLYCERIN  (NITROSTAT ) 0.4 MG SL tablet Place 1 tablet (0.4 mg total) under the tongue every 5 (five) minutes as needed for chest pain. 25 tablet 3   pantoprazole  (PROTONIX ) 40 MG tablet Take 1 tablet by mouth once daily 90 tablet 1   Polyvinyl Alcohol -Povidone (REFRESH OP) Place 1 drop into the left eye 2 (two) times daily as needed (dryness).     psyllium (METAMUCIL) 58.6 % powder Take 1 packet by mouth daily.     tamsulosin  (FLOMAX ) 0.4 MG CAPS capsule TAKE 1 CAPSULE BY MOUTH DAILY 90 capsule 3   triamcinolone  cream (KENALOG ) 0.1 % Apply 1 Application topically 2 (two) times daily. 453.6 g 0   valsartan  (DIOVAN ) 320 MG tablet Take 1 tablet by mouth once daily 90 tablet 2   zinc gluconate 50 MG tablet Take 50 mg by mouth daily.     cetirizine  (ZYRTEC ) 10 MG tablet Take 10 mg by mouth daily.     rosuvastatin  (CRESTOR ) 20 MG tablet Take 1  tablet by mouth once daily 90 tablet 0   albuterol  (PROVENTIL ) (2.5 MG/3ML) 0.083% nebulizer solution Take 3 mLs (2.5 mg total) by nebulization every 6 (six) hours as needed for wheezing or shortness of breath.     albuterol  (VENTOLIN  HFA) 108 (90 Base) MCG/ACT inhaler Inhale 1-2 puffs into the lungs every 6 (six) hours as needed. 8 g 2   Cyanocobalamin  (B-12 PO) Take 1 tablet by mouth daily.     furosemide  (LASIX ) 20 MG tablet Take 1 tablet (20 mg total) by mouth daily. (Patient not taking: Reported on 02/14/2024) 30 tablet 0   methocarbamol  (ROBAXIN ) 500 MG tablet Take 1 tablet (500 mg total) by mouth every 6 (six) hours as needed for muscle spasms. 40 tablet 0   Misc. Devices MISC Nebulizer machine with tubing 1 each 0   No facility-administered medications prior to visit.   No Known Allergies   Objective:   Today's Vitals   09/11/24 1553  BP: 112/64  Pulse: 74  Temp: (!) 97 F (36.1 C)  TempSrc: Temporal  SpO2: 95%  Weight: 176 lb (79.8 kg)  Height: 5' 4 (1.626 m)   Body mass index is 30.21 kg/m.  General: Well developed, well nourished. No acute distress. HEENT: Normocephalic, non-traumatic. PERRL, EOMI. Conjunctiva clear. External ears normal. EAC and TMs normal bilaterally. Nose    clear without congestion or rhinorrhea. Mucous membranes moist. Oropharynx clear. Partial bridge present in upper jaw.. Neck: Supple. No lymphadenopathy. No thyromegaly. Lungs: Mild coarsenign of respiraitons. No wheezing, rales or rhonchi. CV: RRR with a I/VI systolic murmur. Occasional pause noted. Pulses 2+ bilaterally. Abdomen: Soft, non-tender. Bowel sounds positive, normal pitch and frequency. No hepatosplenomegaly. No rebound or guarding. Extremities: Full ROM. Mild bony changes to the right knee. Mild crepitance present. Skin: Warm and dry. There eis a 2 cm round area of erythema with a scaly border on the right upper arm. Psych: Alert and oriented. Normal mood and affect.  There are  no preventive care reminders to display for this patient.    Assessment & Plan:   Problem List Items Addressed This Visit       Cardiovascular and Mediastinum   Chronic systolic heart failure (HCC)   Compensated. Continue valsartan  320 mg daily.      Relevant Medications   rosuvastatin  (CRESTOR ) 20 MG tablet   tadalafil  (CIALIS ) 10 MG tablet   NSVT (nonsustained ventricular tachycardia) (HCC)   No current issue with SVT. Continue to follow with cardiology.      Relevant Medications   rosuvastatin  (CRESTOR ) 20 MG tablet   tadalafil  (CIALIS ) 10 MG tablet     Respiratory   Allergic rhinitis   I will renew his cetirizine  for daily use. I recommend he consider restarting Flonase  nasal spray which he used in the past.      Relevant Medications   cetirizine  (ZYRTEC ) 10 MG tablet   fluticasone  (FLONASE ) 50 MCG/ACT nasal spray     Musculoskeletal and Integument   Osteoarthritis of right knee   Advanced OA limiting mobility. Follow through with orthopedics regarding his proposed surgery.        Other   HLD (hyperlipidemia)   Continue rosuvastatin  20 mg daily.      Relevant Medications   rosuvastatin  (CRESTOR ) 20 MG tablet   tadalafil  (CIALIS ) 10 MG tablet   Vasculogenic erectile dysfunction   Mr. Candela requests a refill of his Cialis .      Relevant Medications   tadalafil  (CIALIS ) 10 MG tablet   Other Visit Diagnoses       Preoperative examination    -  Primary   Medically stable and at low risk. I do recommend cardiology assessment to clear from a cardiac status. Likley low risk for spinal anesthesia.     Tinea corporis       Rash c/w with a ringworm. I will treat with Lotrisone.   Relevant Medications   clotrimazole-betamethasone (LOTRISONE) cream     Need for immunization against influenza           Return in about 3 months (around 12/12/2024) for Reassessment.   Garnette CHRISTELLA Simpler, MD

## 2024-09-11 NOTE — Assessment & Plan Note (Signed)
 Compensated. Continue valsartan  320 mg daily.

## 2024-09-11 NOTE — Assessment & Plan Note (Signed)
 David Goodman requests a refill of his Cialis .

## 2024-09-11 NOTE — Assessment & Plan Note (Signed)
 Advanced OA limiting mobility. Follow through with orthopedics regarding his proposed surgery.

## 2024-09-11 NOTE — Assessment & Plan Note (Signed)
 I will renew his cetirizine  for daily use. I recommend he consider restarting Flonase  nasal spray which he used in the past.

## 2024-09-11 NOTE — Assessment & Plan Note (Signed)
 Continue rosuvastatin 20 mg daily

## 2024-09-12 ENCOUNTER — Telehealth (HOSPITAL_BASED_OUTPATIENT_CLINIC_OR_DEPARTMENT_OTHER): Payer: Self-pay | Admitting: *Deleted

## 2024-09-12 ENCOUNTER — Telehealth: Payer: Self-pay

## 2024-09-12 NOTE — Telephone Encounter (Signed)
 Pt has been scheduled tele preop appt 10/09/24. Med rec and consent are done.

## 2024-09-12 NOTE — Telephone Encounter (Signed)
   Name: David Goodman  DOB: 11-30-1940  MRN: [id number]  Primary Cardiologist: Candyce Reek, MD   Preoperative team, please contact this patient and set up a phone call appointment for further preoperative risk assessment. Please obtain consent and complete medication review. Thank you for your help.  I confirm that guidance regarding antiplatelet and oral anticoagulation therapy has been completed and, if necessary, noted below.  I also confirmed the patient resides in the state of [place] . As per [facility] Medical Board telemedicine laws, the patient must reside in the state in which the provider is licensed.   Lamarr Satterfield, NP 09/12/2024, 11:00 AM [facility]

## 2024-09-12 NOTE — Telephone Encounter (Signed)
 Pt has been scheduled tele preop appt 10/09/24. Med rec and consent are done.      Patient Consent for Virtual Visit        David Goodman has provided verbal consent on 09/12/2024 for a virtual visit (video or telephone).   CONSENT FOR VIRTUAL VISIT FOR:  David Goodman  By participating in this virtual visit I agree to the following:  I hereby voluntarily request, consent and authorize [facility] and its employed or contracted physicians, physician assistants, nurse practitioners or other licensed health care professionals (the Practitioner), to provide me with telemedicine health care services (the "Services) as deemed necessary by the treating Practitioner. I acknowledge and consent to receive the Services by the Practitioner via telemedicine. I understand that the telemedicine visit will involve communicating with the Practitioner through live audiovisual communication technology and the disclosure of certain medical information by electronic transmission. I acknowledge that I have been given the opportunity to request an in-person assessment or other available alternative prior to the telemedicine visit and am voluntarily participating in the telemedicine visit.  I understand that I have the right to withhold or withdraw my consent to the use of telemedicine in the course of my care at any time, without affecting my right to future care or treatment, and that the Practitioner or I may terminate the telemedicine visit at any time. I understand that I have the right to inspect all information obtained and/or recorded in the course of the telemedicine visit and may receive copies of available information for a reasonable fee.  I understand that some of the potential risks of receiving the Services via telemedicine include:  Delay or interruption in medical evaluation due to technological equipment failure or disruption; Information transmitted may not be sufficient (e.g. poor resolution  of images) to allow for appropriate medical decision making by the Practitioner; and/or  In rare instances, security protocols could fail, causing a breach of personal health information.  Furthermore, I acknowledge that it is my responsibility to provide information about my medical history, conditions and care that is complete and accurate to the best of my ability. I acknowledge that Practitioner's advice, recommendations, and/or decision may be based on factors not within their control, such as incomplete or inaccurate data provided by me or distortions of diagnostic images or specimens that may result from electronic transmissions. I understand that the practice of medicine is not an exact science and that Practitioner makes no warranties or guarantees regarding treatment outcomes. I acknowledge that a copy of this consent can be made available to me via my patient portal [facility] MyChart), or I can request a printed copy by calling the office of [facility].    I understand that my insurance will be billed for this visit.   I have read or had this consent read to me. I understand the contents of this consent, which adequately explains the benefits and risks of the Services being provided via telemedicine.  I have been provided ample opportunity to ask questions regarding this consent and the Services and have had my questions answered to my satisfaction. I give my informed consent for the services to be provided through the use of telemedicine in my medical care

## 2024-09-12 NOTE — Telephone Encounter (Signed)
   Pre-operative Risk Assessment    Patient Name: David Goodman  DOB: September 12, 1941 MRN: [id number]   Date of last office visit: 01/25/24 ORREN FABRY, PA-C Date of next office visit: NONE   Request for Surgical Clearance    Procedure:  RIGHT TOTAL KNEE ARTHROPLASTY  Date of Surgery:  Clearance 11/05/24                                Surgeon:  DR DEMPSEY MOAN Surgeon's Group or Practice Name:  [facility] Phone number:  [phone] Fax number:  [phone]  ATTN: KERRI MAZE   Type of Clearance Requested:   - Medical  - Pharmacy:  [facility]  PER REQUEST NOT LISTED ON MED LIST   Type of Anesthesia:  CHOICE   Additional requests/questions:    SignedLucie DELENA Ku   09/12/2024, 10:51 AM

## 2024-09-15 ENCOUNTER — Emergency Department (HOSPITAL_BASED_OUTPATIENT_CLINIC_OR_DEPARTMENT_OTHER)
Admission: EM | Admit: 2024-09-15 | Discharge: 2024-09-15 | Disposition: A | Discharge status: Home / Self Care | Attending: Emergency Medicine | Admitting: Emergency Medicine

## 2024-09-15 ENCOUNTER — Encounter (HOSPITAL_BASED_OUTPATIENT_CLINIC_OR_DEPARTMENT_OTHER): Payer: Self-pay

## 2024-09-15 ENCOUNTER — Other Ambulatory Visit: Payer: Self-pay

## 2024-09-15 DIAGNOSIS — R21 Rash and other nonspecific skin eruption: Secondary | ICD-10-CM | POA: Insufficient documentation

## 2024-09-15 DIAGNOSIS — I2510 Atherosclerotic heart disease of native coronary artery without angina pectoris: Secondary | ICD-10-CM | POA: Insufficient documentation

## 2024-09-15 DIAGNOSIS — Z96652 Presence of left artificial knee joint: Secondary | ICD-10-CM | POA: Diagnosis not present

## 2024-09-15 DIAGNOSIS — M7989 Other specified soft tissue disorders: Secondary | ICD-10-CM | POA: Insufficient documentation

## 2024-09-15 DIAGNOSIS — I110 Hypertensive heart disease with heart failure: Secondary | ICD-10-CM | POA: Insufficient documentation

## 2024-09-15 DIAGNOSIS — R2230 Localized swelling, mass and lump, unspecified upper limb: Secondary | ICD-10-CM | POA: Diagnosis not present

## 2024-09-15 DIAGNOSIS — I509 Heart failure, unspecified: Secondary | ICD-10-CM | POA: Diagnosis not present

## 2024-09-15 DIAGNOSIS — B084 Enteroviral vesicular stomatitis with exanthem: Secondary | ICD-10-CM | POA: Diagnosis not present

## 2024-09-15 LAB — BASIC METABOLIC PANEL WITH GFR
Anion gap: 13 (ref 5–15)
BUN: 27 mg/dL — ABNORMAL HIGH (ref 8–23)
CO2: 23 mmol/L (ref 22–32)
Calcium: 8.8 mg/dL — ABNORMAL LOW (ref 8.9–10.3)
Chloride: 99 mmol/L (ref 98–111)
Creatinine, Ser: 0.92 mg/dL (ref 0.61–1.24)
GFR, Estimated: >60 mL/min (ref >60)
Glucose, Bld: 88 mg/dL (ref 70–99)
Potassium: 4 mmol/L (ref 3.5–5.1)
Sodium: 135 mmol/L (ref 135–145)

## 2024-09-15 LAB — CBC WITH DIFFERENTIAL/PLATELET
Abs Immature Granulocytes: 0.01 K/uL (ref 0.00–0.07)
Basophils Absolute: 0.1 K/uL (ref 0.0–0.1)
Basophils Relative: 1 %
Eosinophils Absolute: 0.4 K/uL (ref 0.0–0.5)
Eosinophils Relative: 6 %
HCT: 37.2 % — ABNORMAL LOW (ref 39.0–52.0)
Hemoglobin: 13.1 g/dL (ref 13.0–17.0)
Immature Granulocytes: 0 %
Lymphocytes Relative: 14 %
Lymphs Abs: 1 K/uL (ref 0.7–4.0)
MCH: 29.9 pg (ref 26.0–34.0)
MCHC: 35.2 g/dL (ref 30.0–36.0)
MCV: 84.9 fL (ref 80.0–100.0)
Monocytes Absolute: 0.5 K/uL (ref 0.1–1.0)
Monocytes Relative: 8 %
Neutro Abs: 4.9 K/uL (ref 1.7–7.7)
Neutrophils Relative %: 71 %
Platelets: 204 K/uL (ref 150–400)
RBC: 4.38 MIL/uL (ref 4.22–5.81)
RDW: 13.4 % (ref 11.5–15.5)
WBC: 6.8 K/uL (ref 4.0–10.5)
nRBC: 0 % (ref 0.0–0.2)

## 2024-09-15 LAB — PRO BRAIN NATRIURETIC PEPTIDE: Pro Brain Natriuretic Peptide: 152 pg/mL (ref <300.0)

## 2024-09-15 MED ORDER — HYDROXYZINE HCL 10 MG PO TABS: 10.0000 mg | ORAL_TABLET | Freq: Four times a day (QID) | ORAL | 0 refills | Status: AC | PRN

## 2024-09-15 MED ORDER — KETOCONAZOLE 2 % EX CREA: 1.0000 | TOPICAL_CREAM | Freq: Every day | CUTANEOUS | 0 refills | Status: AC

## 2024-09-15 NOTE — ED Provider Notes (Signed)
 Emergency Department Provider Note   I have reviewed the triage vital signs and the nursing notes.   HISTORY  Chief Complaint Rash and Hand Swelling   HPI David Goodman is a 83 y.o. male with past history reviewed below including congestive heart failure, hypertension, hyperlipidemia presents to the emergency department with itching to the forearm and new rash to the hand along with some bilateral hand swelling.  He was unable to get his ring on today.  He denies severe swelling into the legs or shortness of breath.  No chest pains.  He went to the primary care doctor 4 days ago for a physical in preparation for knee replacement surgery.  During that visit he pointed out some area of rash to the left knee diagnosed as tinea corporis and started on lotrisone cream.  He has been using the cream but noticed new rash popped up on the left wrist and right forearm.  He states this is also itchy.  No other new medications.  He has been compliant with his home medicines. No fever. No blistering.   Past Medical History:  Diagnosis Date   ALLERGIC RHINITIS 07/13/2007   Aorta dilated on Echo; Normal sized Aorta on CT    Chest CT 1/23: No thoracic aortic aneurysm; coronary calcifications; aortic atherosclerosis; improved aeration of lungs with persistent minimal reticular opacities   Arthritis    Blood transfusion without reported diagnosis    CAD (coronary artery disease)    CHF (congestive heart failure) (HCC)    Clotting disorder    Diverticulitis    GERD (gastroesophageal reflux disease)    GI bleed    Glaucoma    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)    HYPERLIPIDEMIA 07/13/2007   HYPERTENSION 07/13/2007   Impaired glucose tolerance 09/27/2016   Iron deficiency anemia due to chronic blood loss 02/27/2021   NEPHROLITHIASIS, 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   NSVT (nonsustained ventricular tachycardia) (HCC)    PEPTIC ULCER DISEASE 07/13/2007   PVC's (premature ventricular  contractions)    Sleep apnea    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 Respiratory: Denies shortness of breath. Gastrointestinal: No abdominal pain.  No nausea, no vomiting.   Musculoskeletal: Positive hand swelling (L>R) Skin: Positive rash.  Neurological: Negative for headaches.  ____________________________________________   PHYSICAL EXAM:  VITAL SIGNS: ED Triage Vitals  Encounter Vitals Group     BP 09/15/24 1128 (!) 158/82     Pulse Rate 09/15/24 1128 68     Resp 09/15/24 1128 16     Temp 09/15/24 1128 98.3 F (36.8 C)     Temp Source 09/15/24 1128 Oral     SpO2 09/15/24 1128 96 %   Constitutional: Alert and oriented. Well appearing and in no acute distress. Eyes: Conjunctivae are normal. Head: Atraumatic. Nose: No congestion/rhinnorhea. Mouth/Throat: Mucous membranes are moist.  Neck: No stridor.   Cardiovascular: Normal rate, regular rhythm. Good peripheral circulation. Grossly normal heart sounds.   Respiratory: Normal respiratory effort.  No retractions. Lungs CTAB. Gastrointestinal: Soft and nontender. No distention.  Musculoskeletal: No lower extremity tenderness nor edema. No gross deformities of extremities.  Slight swelling of the bilateral hands, worse on the left without cellulitis or induration. Compartments are soft.  Neurologic:  Normal speech and language. No gross focal neurologic deficits are appreciated.  Skin:  Skin is warm, dry and intact. Tinea type rash to the left knee and right wrist.  Punctate erythematous rash to the right forearm. No urticaria. No petechiae.   ____________________________________________   LABS (all labs ordered are listed, but only abnormal results are displayed)  Labs Reviewed  BASIC METABOLIC PANEL WITH GFR - Abnormal; Notable for the following components:      Result Value   BUN 27 (*)    Calcium  8.8 (*)    All other  components within normal limits  CBC WITH DIFFERENTIAL/PLATELET - Abnormal; Notable for the following components:   HCT 37.2 (*)    All other components within normal limits  PRO BRAIN NATRIURETIC PEPTIDE   ____________________________________________   PROCEDURES  Procedure(s) performed:   Procedures  None  ____________________________________________   INITIAL IMPRESSION / ASSESSMENT AND PLAN / ED COURSE  Pertinent labs & imaging results that were available during my care of the patient were reviewed by me and considered in my medical decision making (see chart for details).   This patient is Presenting for Evaluation of rash, which does require a range of treatment options, and is a complaint that involves a moderate risk of morbidity and mortality.  The Differential Diagnoses include tines, allergic reaction, contact dermatitis, AKI, anemia, etc.  I did obtain Additional Historical Information from family at bedside.  I decided to review pertinent External Data, and in summary reviewed PCP note from 09/11/24.   Clinical Laboratory Tests Ordered, included CBC without leukocytosis or anemia.  Basic metabolic shows normal kidney function.  BNP normal.  Medical Decision Making: Summary:  The patient presents emergency department itchy rash to the right forearm and left wrist.  Left wrist rash seems consistent with tinea as well.  The rash to the right forearm is not consistent with infectious type rash or acute drug type reaction.  I will obtain screening blood work given the patient's history of CHF and swelling in the hands.  I do not appreciate significant lower extremity edema.   Reevaluation with update and discussion with patient at bedside.  Discussed changing to alternate with fungal cream which was called into the pharmacy.  Plan for patient to follow closely with the primary care doctor.   Patient's presentation is most consistent with acute, uncomplicated illness.    Disposition: discharge  ____________________________________________  FINAL CLINICAL IMPRESSION(S) / ED DIAGNOSES  Final diagnoses:  Rash  Hand swelling     NEW OUTPATIENT MEDICATIONS STARTED DURING THIS VISIT:  Discharge Medication List as of 09/15/2024 12:42 PM     START taking these medications   Details  hydrOXYzine (ATARAX) 10 MG tablet Take 1 tablet (10 mg total) by mouth every 6 (six) hours as needed for itching., Starting Sat 09/15/2024, Normal    ketoconazole (NIZORAL) 2 % cream Apply 1 Application topically daily for 15 days., Starting Sat 09/15/2024, Until Sun 09/30/2024, Normal        Note:  This document was prepared using Dragon voice recognition software and may include unintentional dictation errors.  Fonda Law, MD, [facility]    Stetson Pelaez, Fonda MATSU, MD 09/16/24 [phone]

## 2024-09-15 NOTE — Discharge Instructions (Addendum)
 I have prescribed a new cream use to treat your fungal infection.  Please pick up the new prescription at your pharmacy.  I have also called in some pills to take as needed for itching.  Please follow closely with your primary care doctor.

## 2024-09-15 NOTE — ED Triage Notes (Signed)
 Reports rash on L knee since Tuesday, states spread to BUE. Seen by PCP on Tuesday. Also reports swelling of BIL hands.   Denies chest pain or SHOB

## 2024-09-19 NOTE — Progress Notes (Unsigned)
   09/19/2024  [name], male   DOB: [birth year], 83 y.o.   MRN: [id number]  This patient is appearing on a report for being at risk of failing the adherence measure for cholesterol (statin) medications this calendar year.   Medication: rosuvastatin  20 mg tablets Last fill date: 09/17/24 for 90 day supply  Insurance report was not up to date. No action needed at this time.   Link Burgeson C. Yalena Colon [facility] PharmD Candidate Class of [phone]

## 2024-10-07 ENCOUNTER — Other Ambulatory Visit: Payer: Self-pay | Admitting: Family Medicine

## 2024-10-09 ENCOUNTER — Ambulatory Visit: Attending: Cardiology | Admitting: Student

## 2024-10-09 DIAGNOSIS — Z01810 Encounter for preprocedural cardiovascular examination: Secondary | ICD-10-CM

## 2024-10-09 NOTE — Progress Notes (Signed)
 Virtual Visit via Telephone Note   Because of David Goodman's co-morbid illnesses, he is at least at moderate risk for complications without adequate follow up.  This format is felt to be most appropriate for this patient at this time.  The patient did not have access to video technology/had technical difficulties with video requiring transitioning to audio format only (telephone).  All issues noted in this document were discussed and addressed.  No physical exam could be performed with this format.  Please refer to the patient's chart for his consent to telehealth for [facility].  Evaluation Performed:  Preoperative cardiovascular risk assessment _____________   Date:  10/09/2024   Patient ID:  David Goodman, DOB 30-Nov-1940, MRN [id number] Patient Location:  Home Provider location:   Office  Primary Care Provider:  Thedora Garnette HERO, MD  Primary Cardiologist:   [facility] Providers Cardiologist:  Candyce Reek, MD    Chief Complaint / Patient Profile   83 y.o. 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 Melodi and presents today for telephonic preoperative cardiovascular risk assessment.  History of Present Illness    David Goodman is a 83 y.o. male who presents via audio/video conferencing for a telehealth visit today.  Pt was last seen in cardiology clinic on 01/25/2024 by Orren Fabry, PA-C.  At that time David Goodman was stable from a cardiac standpoint.  The patient is now pending procedure as outlined above. Since his last visit, he is doing well. Patient denies shortness of breath, dyspnea on exertion, lower extremity edema, orthopnea or PND. No chest pain, pressure, or tightness. No palpitations.  He is not experiencing lightheadedness, dizziness, presyncope or syncope. Her is very active going to the gym  3 days a week to ride the stationary bike for 20 minutes and use the weight machines.   Past Medical History    Past Medical History:  Diagnosis Date   ALLERGIC RHINITIS 07/13/2007   Aorta dilated on Echo; Normal sized Aorta on CT    Chest CT 1/23: No thoracic aortic aneurysm; coronary calcifications; aortic atherosclerosis; improved aeration of lungs with persistent minimal reticular opacities   Arthritis    Blood transfusion without reported diagnosis    CAD (coronary artery disease)    CHF (congestive heart failure) (HCC)    Clotting disorder    Diverticulitis    GERD (gastroesophageal reflux disease)    GI bleed    Glaucoma    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)    HYPERLIPIDEMIA 07/13/2007   HYPERTENSION 07/13/2007   Impaired glucose tolerance 09/27/2016   Iron deficiency anemia due to chronic blood loss 02/27/2021   NEPHROLITHIASIS, 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   NSVT (nonsustained ventricular tachycardia) (HCC)    PEPTIC ULCER DISEASE 07/13/2007   PVC's (premature ventricular contractions)    Sleep apnea    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   Past Surgical History:  Procedure Laterality Date   BROW LIFT Bilateral 11/10/2020   Procedure: BLEPHAROPLASTY;  Surgeon: Elisabeth Craig RAMAN, MD;  Location: [facility];  Service: Plastics;  Laterality: Bilateral;  1 hour   COLONOSCOPY N/A 04/12/2022   Procedure: COLONOSCOPY;  Surgeon: Burnette Fallow, MD;  Location: [facility];  Service: Gastroenterology;  Laterality: N/A;   COLONOSCOPY WITH PROPOFOL  N/A 02/17/2019   Procedure: COLONOSCOPY WITH PROPOFOL ;  Surgeon: Saintclair Jasper, MD;  Location:  [facility];  Service: Gastroenterology;  Laterality: N/A;   CORONARY STENT INTERVENTION N/A 08/25/2021   Procedure: CORONARY STENT INTERVENTION;  Surgeon: Dann Candyce RAMAN, MD;  Location: [facility] INVASIVE CV LAB;  Service: Cardiovascular;  Laterality: N/A;   CORONARY ULTRASOUND/IVUS N/A 08/25/2021    Procedure: Intravascular Ultrasound/IVUS;  Surgeon: Dann Candyce RAMAN, MD;  Location: [facility] INVASIVE CV LAB;  Service: Cardiovascular;  Laterality: N/A;   EYE SURGERY     HYDROCELE EXCISION Right 05/15/2003   IR ANGIOGRAM VISCERAL SELECTIVE  02/16/2019   IR ANGIOGRAM VISCERAL SELECTIVE  02/16/2019   IR ANGIOGRAM VISCERAL SELECTIVE  02/16/2019   IR US  GUIDE VASC ACCESS RIGHT  02/16/2019   JOINT REPLACEMENT     LEFT HEART CATH AND CORONARY ANGIOGRAPHY N/A 07/28/2021   Procedure: LEFT HEART CATH AND CORONARY ANGIOGRAPHY;  Surgeon: Dann Candyce RAMAN, MD;  Location: [facility] INVASIVE CV LAB;  Service: Cardiovascular;  Laterality: N/A;   LEFT HEART CATH AND CORONARY ANGIOGRAPHY N/A 08/25/2021   Procedure: LEFT HEART CATH AND CORONARY ANGIOGRAPHY;  Surgeon: Dann Candyce RAMAN, MD;  Location: [facility] INVASIVE CV LAB;  Service: Cardiovascular;  Laterality: N/A;   PERIPHERAL INTRAVASCULAR LITHOTRIPSY  08/25/2021   Procedure: INTRAVASCULAR LITHOTRIPSY;  Surgeon: Dann Candyce RAMAN, MD;  Location: [facility] INVASIVE CV LAB;  Service: Cardiovascular;;   ROTATOR CUFF REPAIR     SPERMATOCELECTOMY Right 05/15/2003   TOTAL KNEE ARTHROPLASTY Left 07/11/2023   Procedure: TOTAL KNEE ARTHROPLASTY;  Surgeon: Melodi Lerner, MD;  Location: [facility];  Service: Orthopedics;  Laterality: Left;    Allergies  No Known Allergies  Home Medications    Prior to Admission medications   Medication Sig Start Date End Date Taking? Authorizing Provider  Ascorbic Acid (VITAMIN C) 1000 MG tablet Take 1,000 mg by mouth 2 (two) times a week.    [provider]  cetirizine  (ZYRTEC ) 10 MG tablet Take 1 tablet (10 mg total) by mouth daily. 09/11/24   Thedora Garnette HERO, MD  clotrimazole-betamethasone (LOTRISONE) cream Apply 1 Application topically daily. 09/11/24   Thedora Garnette HERO, MD  fluticasone  (FLONASE ) 50 MCG/ACT nasal spray Place 2 sprays into both nostrils daily. 09/11/24   Thedora Garnette HERO, MD  hydrochlorothiazide  (HYDRODIURIL )  25 MG tablet Take 1 tablet (25 mg total) by mouth daily. 01/25/24   Lucien Orren SAILOR, PA-C  hydrOXYzine (ATARAX) 10 MG tablet Take 1 tablet (10 mg total) by mouth every 6 (six) hours as needed for itching. 09/15/24   Long, Joshua G, MD  latanoprost  (XALATAN ) 0.005 % ophthalmic solution Place 1 drop into both eyes at bedtime. 06/20/17   Jame Maude FALCON, MD  MAGNESIUM  PO Take 400 mg by mouth at bedtime.    [provider]  nitroGLYCERIN  (NITROSTAT ) 0.4 MG SL tablet Place 1 tablet (0.4 mg total) under the tongue every 5 (five) minutes as needed for chest pain. 07/22/21   Dann Candyce RAMAN, MD  pantoprazole  (PROTONIX ) 40 MG tablet Take 1 tablet by mouth once daily 08/08/24   Conte, Tessa N, PA-C  Polyvinyl Alcohol -Povidone (REFRESH OP) Place 1 drop into the left eye 2 (two) times daily as needed (dryness).    [provider]  psyllium (METAMUCIL) 58.6 % powder Take 1 packet by mouth daily.    [provider]  rosuvastatin  (CRESTOR ) 20 MG tablet Take 1 tablet (20 mg total) by mouth daily. 09/11/24   Thedora Garnette HERO, MD  tadalafil  (CIALIS ) 10 MG tablet Take 1 tablet (10 mg total) by mouth every other day as needed  for erectile dysfunction. 09/11/24   Thedora Garnette HERO, MD  tamsulosin  (FLOMAX ) 0.4 MG CAPS capsule TAKE 1 CAPSULE BY MOUTH DAILY 10/08/24   Thedora Garnette HERO, MD  triamcinolone  cream (KENALOG ) 0.1 % Apply 1 Application topically 2 (two) times daily. 05/04/24   Nche, Roselie Rockford, NP  valsartan  (DIOVAN ) 320 MG tablet Take 1 tablet by mouth once daily 03/30/24   Varanasi, Jayadeep S, MD  zinc gluconate 50 MG tablet Take 50 mg by mouth daily.    [provider]    Physical Exam    Vital Signs:  David Goodman does not have vital signs available for review today.  Given telephonic nature of communication, physical exam is limited. AAOx3. NAD. Normal affect.  Speech and respirations are unlabored.   Assessment & Plan    Preoperative cardiovascular risk  assessment. Right total knee arthroplasty by Dr. Melodi on 11/05/2024.  Chart reviewed as part of pre-operative protocol coverage. According to the RCRI, patient has a 6.6% risk of MACE. Patient reports activity equivalent to >4.0 METS (stationary bike for 20 minutes and weight machines 3 times a week).   Given past medical history and time since last visit, based on ACC/AHA guidelines, David Goodman would be at acceptable risk for the planned procedure without further cardiovascular testing.   Patient was advised that if he develops new symptoms prior to surgery to contact our office to arrange a follow-up appointment.  he verbalized understanding.  Ideally aspirin  should be continued without interruption, however if the bleeding risk is too great, aspirin  may be held for 5-7 days prior to surgery. Please resume aspirin  post operatively when it is felt to be safe from a bleeding standpoint.    I will route this recommendation to the requesting party via Epic fax function.  Please call with questions.  Time:   Today, I have spent 5 minutes with the patient with telehealth technology discussing medical history, symptoms, and management plan.     Barnie Hila, NP  10/09/2024, 8:25 AM

## 2024-10-13 ENCOUNTER — Other Ambulatory Visit: Payer: Self-pay | Admitting: Family Medicine

## 2024-10-13 DIAGNOSIS — B354 Tinea corporis: Secondary | ICD-10-CM

## 2024-10-15 NOTE — H&P (Signed)
 TOTAL KNEE ADMISSION H&P  Patient is being admitted for right total knee arthroplasty.  Subjective:  Chief Complaint: Right knee pain.  HPI: David Goodman, 83 y.o. male has a history of pain and functional disability in the right knee due to arthritis and has failed non-surgical conservative treatments for greater than 12 weeks to include NSAID's and/or analgesics, corticosteriod injections, and activity modification. Onset of symptoms was gradual, starting >10 years ago with gradually worsening course since that time. The patient noted no past surgery on the right knee.  Patient currently rates pain in the right knee at 9 out of 10 with activity. Patient has night pain, worsening of pain with activity and weight bearing, pain with passive range of motion, and crepitus. Patient has evidence of tricompartmental degenerative changes with osteophytes present by imaging studies.There is no active infection.  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 09/09/2022   Gait instability 09/09/2022   Acute right-sided low back pain without sciatica 09/03/2022   History of colonic polyps 07/22/2022   Gastro-esophageal reflux disease with esophagitis 07/22/2022   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   OSA on CPAP 12/13/2021   Coronary artery disease involving native coronary artery of native heart 09/21/2021   SOBOE (shortness of breath on exertion) 09/21/2021   HFrEF (heart failure with reduced  ejection fraction) (HCC)    Cerebrovascular disease 06/05/2021   Hiatal hernia 05/29/2021   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   Erectile dysfunction due to arterial insufficiency 02/27/2021   Hemorrhoids 07/17/2020   Syncope 11/15/2018   Diverticulitis 05/25/2018   Osteoarthritis of right knee 12/13/2017   OA (osteoarthritis) of knee 12/13/2017   Testosterone  deficiency 03/22/2017   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/27/2016   BPH (benign prostatic hyperplasia) 06/18/2013   HLD (hyperlipidemia) 07/13/2007   Essential hypertension 07/13/2007   Allergic rhinitis 07/13/2007   History of nephrolithiasis 07/13/2007    Past Medical History:  Diagnosis Date   ALLERGIC RHINITIS 07/13/2007   Aorta dilated on Echo; Normal sized Aorta on CT    Chest CT 1/23: No thoracic aortic aneurysm; coronary calcifications; aortic atherosclerosis; improved aeration of lungs with persistent minimal reticular opacities   Arthritis    Blood transfusion without reported diagnosis    CAD (coronary artery disease)    CHF (congestive heart failure) (HCC)    Clotting disorder    Diverticulitis    GERD (gastroesophageal reflux disease)    GI bleed    Glaucoma    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)    HYPERLIPIDEMIA 07/13/2007   HYPERTENSION 07/13/2007   Impaired glucose tolerance 09/27/2016   Iron deficiency anemia due to chronic blood loss 02/27/2021   NEPHROLITHIASIS, 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   NSVT (nonsustained ventricular tachycardia) (HCC)    PEPTIC ULCER DISEASE 07/13/2007   PVC's (premature ventricular contractions)    Sleep apnea    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    Past Surgical History:  Procedure Laterality Date   BROW LIFT Bilateral 11/10/2020  Procedure: BLEPHAROPLASTY;  Surgeon: Elisabeth Craig RAMAN, MD;  Location: [facility];  Service: Plastics;  Laterality: Bilateral;  1 hour    COLONOSCOPY N/A 04/12/2022   Procedure: COLONOSCOPY;  Surgeon: Burnette Fallow, MD;  Location: [facility];  Service: Gastroenterology;  Laterality: N/A;   COLONOSCOPY WITH PROPOFOL  N/A 02/17/2019   Procedure: COLONOSCOPY WITH PROPOFOL ;  Surgeon: Saintclair Jasper, MD;  Location: [facility];  Service: Gastroenterology;  Laterality: N/A;   CORONARY STENT INTERVENTION N/A 08/25/2021   Procedure: CORONARY STENT INTERVENTION;  Surgeon: Dann Candyce RAMAN, MD;  Location: [facility] INVASIVE CV LAB;  Service: Cardiovascular;  Laterality: N/A;   CORONARY ULTRASOUND/IVUS N/A 08/25/2021   Procedure: Intravascular Ultrasound/IVUS;  Surgeon: Dann Candyce RAMAN, MD;  Location: [facility] INVASIVE CV LAB;  Service: Cardiovascular;  Laterality: N/A;   EYE SURGERY     HYDROCELE EXCISION Right 05/15/2003   IR ANGIOGRAM VISCERAL SELECTIVE  02/16/2019   IR ANGIOGRAM VISCERAL SELECTIVE  02/16/2019   IR ANGIOGRAM VISCERAL SELECTIVE  02/16/2019   IR US  GUIDE VASC ACCESS RIGHT  02/16/2019   JOINT REPLACEMENT     LEFT HEART CATH AND CORONARY ANGIOGRAPHY N/A 07/28/2021   Procedure: LEFT HEART CATH AND CORONARY ANGIOGRAPHY;  Surgeon: Dann Candyce RAMAN, MD;  Location: [facility] INVASIVE CV LAB;  Service: Cardiovascular;  Laterality: N/A;   LEFT HEART CATH AND CORONARY ANGIOGRAPHY N/A 08/25/2021   Procedure: LEFT HEART CATH AND CORONARY ANGIOGRAPHY;  Surgeon: Dann Candyce RAMAN, MD;  Location: [facility] INVASIVE CV LAB;  Service: Cardiovascular;  Laterality: N/A;   PERIPHERAL INTRAVASCULAR LITHOTRIPSY  08/25/2021   Procedure: INTRAVASCULAR LITHOTRIPSY;  Surgeon: Dann Candyce RAMAN, MD;  Location: [facility] INVASIVE CV LAB;  Service: Cardiovascular;;   ROTATOR CUFF REPAIR     SPERMATOCELECTOMY Right 05/15/2003   TOTAL KNEE ARTHROPLASTY Left 07/11/2023   Procedure: TOTAL KNEE ARTHROPLASTY;  Surgeon: Melodi Lerner, MD;  Location: [facility];  Service: Orthopedics;  Laterality: Left;    Prior to Admission medications   Medication Sig Start Date End  Date Taking? Authorizing Provider  Ascorbic Acid (VITAMIN C) 1000 MG tablet Take 1,000 mg by mouth 2 (two) times a week.    [provider]  cetirizine  (ZYRTEC ) 10 MG tablet Take 1 tablet (10 mg total) by mouth daily. 09/11/24   Thedora Garnette HERO, MD  clotrimazole-betamethasone (LOTRISONE) cream Apply 1 Application topically daily. 09/11/24   Thedora Garnette HERO, MD  fluticasone  (FLONASE ) 50 MCG/ACT nasal spray Place 2 sprays into both nostrils daily. 09/11/24   Thedora Garnette HERO, MD  hydrochlorothiazide  (HYDRODIURIL ) 25 MG tablet Take 1 tablet (25 mg total) by mouth daily. 01/25/24   Lucien Orren SAILOR, PA-C  hydrOXYzine (ATARAX) 10 MG tablet Take 1 tablet (10 mg total) by mouth every 6 (six) hours as needed for itching. 09/15/24   Long, Joshua G, MD  latanoprost  (XALATAN ) 0.005 % ophthalmic solution Place 1 drop into both eyes at bedtime. 06/20/17   Jame Maude FALCON, MD  MAGNESIUM  PO Take 400 mg by mouth at bedtime.    [provider]  nitroGLYCERIN  (NITROSTAT ) 0.4 MG SL tablet Place 1 tablet (0.4 mg total) under the tongue every 5 (five) minutes as needed for chest pain. 07/22/21   Dann Candyce RAMAN, MD  pantoprazole  (PROTONIX ) 40 MG tablet Take 1 tablet by mouth once daily 08/08/24   Conte, Tessa N, PA-C  Polyvinyl Alcohol -Povidone (REFRESH OP) Place 1 drop into the left eye 2 (two) times daily as needed (dryness).    [provider]  psyllium (METAMUCIL)  58.6 % powder Take 1 packet by mouth daily.    [provider]  rosuvastatin  (CRESTOR ) 20 MG tablet Take 1 tablet (20 mg total) by mouth daily. 09/11/24   Thedora Garnette HERO, MD  tadalafil  (CIALIS ) 10 MG tablet Take 1 tablet (10 mg total) by mouth every other day as needed for erectile dysfunction. 09/11/24   Thedora Garnette HERO, MD  tamsulosin  (FLOMAX ) 0.4 MG CAPS capsule TAKE 1 CAPSULE BY MOUTH DAILY 10/08/24   Thedora Garnette HERO, MD  triamcinolone  cream (KENALOG ) 0.1 % Apply 1 Application topically 2 (two) times daily. 05/04/24    Nche, Roselie Rockford, NP  valsartan  (DIOVAN ) 320 MG tablet Take 1 tablet by mouth once daily 03/30/24   Varanasi, Jayadeep S, MD  zinc gluconate 50 MG tablet Take 50 mg by mouth daily.    [provider]    No Known Allergies  Social History   Socioeconomic History   Marital status: Married    Spouse name: Not on file   Number of children: Not on file   Years of education: Not on file   Highest education level: 12th grade  Occupational History   Not on file  Tobacco Use   Smoking status: Never   Smokeless tobacco: Never  Vaping Use   Vaping status: Never Used  Substance and Sexual Activity   Alcohol  use: Not Currently    Alcohol /week: 2.0 standard drinks of alcohol     Types: 2 Glasses of wine per week    Comment: couple times a week - wine   Drug use: No   Sexual activity: Not Currently  Other Topics Concern   Not on file  Social History Narrative   Lives alone   Right handed   Caffeine: 1 cup of coffee a day   Social Drivers of Corporate Investment Banker Strain: Low Risk  (09/07/2024)   Overall Financial Resource Strain (CARDIA)    Difficulty of Paying Living Expenses: Not hard at all  Food Insecurity: Unknown (09/07/2024)   Hunger Vital Sign    Worried About Running Out of Food in the Last Year: Never true    Ran Out of Food in the Last Year: Not on file  Transportation Needs: No Transportation Needs (09/07/2024)   PRAPARE - Administrator, Civil Service (Medical): No    Lack of Transportation (Non-Medical): No  Physical Activity: Insufficiently Active (09/07/2024)   Exercise Vital Sign    Days of Exercise per Week: 3 days    Minutes of Exercise per Session: 40 min  Stress: No Stress Concern Present (09/07/2024)   [employer] of Occupational Health - Occupational Stress Questionnaire    Feeling of Stress: Only a little  Social Connections: Socially Integrated (09/07/2024)   Social Connection and Isolation Panel    Frequency of  Communication with Friends and Family: Three times a week    Frequency of Social Gatherings with Friends and Family: Twice a week    Attends Religious Services: More than 4 times per year    Active Member of [employer] or Organizations: Yes    Attends Banker Meetings: Never    Marital Status: Married  Catering Manager Violence: Not At Risk (01/16/2024)   Humiliation, Afraid, Rape, and Kick questionnaire    Fear of Current or Ex-Partner: No    Emotionally Abused: No    Physically Abused: No    Sexually Abused: No    Tobacco Use: Low Risk  (10/09/2024)   Patient History  Smoking Tobacco Use: Never    Smokeless Tobacco Use: Never    Passive Exposure: Not on file   Social History   Substance and Sexual Activity  Alcohol  Use Not Currently   Alcohol /week: 2.0 standard drinks of alcohol    Types: 2 Glasses of wine per week   Comment: couple times a week - wine    Family History  Problem Relation Age of Onset   Cancer Brother     Review of Systems  Constitutional:  Negative for chills and fever.  HENT:  Negative for congestion, sore throat and tinnitus.   Eyes:  Negative for double vision, photophobia and pain.  Respiratory:  Negative for cough, shortness of breath and wheezing.   Cardiovascular:  Negative for chest pain, palpitations and orthopnea.  Gastrointestinal:  Negative for heartburn, nausea and vomiting.  Genitourinary:  Negative for dysuria, frequency and urgency.  Musculoskeletal:  Positive for joint pain.  Neurological:  Negative for dizziness, weakness and headaches.    Objective:  Physical Exam: Constitutional: patient is well-developed, well-nourished, and in no acute distress.  Head: normocephalic and atraumatic  Eyes: EOMs appear normal. Lens clear bilaterally  Lungs: Respirations are unlabored  Skin: warm and dry  Psych: Alert and oriented x 3. Mood and affect are normal.  Patient ambulates independently with a normal gait pattern.  Right  knee:  Minimal effusion. TTP medial joint line. Range of motion 0-120 degrees. Positive anterior drawer test. Stable medially and laterally at 0 and 30 degrees of flexion   Imaging Review Plain radiographs demonstrate severe degenerative joint disease of the right knee. The overall alignment is neutral. The bone quality appears to be adequate for age and reported activity level.  Assessment/Plan:  End stage arthritis, right knee   The patient history, physical examination, clinical judgment of the provider and imaging studies are consistent with end stage degenerative joint disease of the right knee and total knee arthroplasty is deemed medically necessary. The treatment options including medical management, injection therapy arthroscopy and arthroplasty were discussed at length. The risks and benefits of total knee arthroplasty were presented and reviewed. The risks due to aseptic loosening, infection, stiffness, patella tracking problems, thromboembolic complications and other imponderables were discussed. The patient acknowledged the explanation, agreed to proceed with the plan and consent was signed. Patient is being admitted for inpatient treatment for surgery, pain control, PT, OT, prophylactic antibiotics, VTE prophylaxis, progressive ambulation and ADLs and discharge planning. The patient is planning to be discharged home.   Patient's anticipated LOS is less than 2 midnights, meeting these requirements: - Lives within 1 hour of care - Has a competent adult at home to recover with post-op recover - NO history of  - Chronic pain requiring opioids  - Diabetes  - Coronary Artery Disease  - Heart failure  - Heart attack  - Stroke  - DVT/VTE  - Cardiac arrhythmia  - Respiratory Failure/COPD  - Renal failure  - Anemia  - Advanced Liver disease  Therapy Plans: EO  Disposition: Home with wife  Planned DVT Prophylaxis: 81mg  Aspirin  BID  DME Needed: none PCP: Garnette Simpler, MD  (clearance received)  Cardiology: Candyce Reek, MD (clearance received)  TXA: IV Allergies: none Metal Allergies: none Anesthesia Concerns: none BMI: 30.2  Last HgbA1c: not diabetic Pharmacy: [facility] to bring on DC Pain regimen: Oxycodone , Tramadol   Other: - Continue 81mg  ASA pre-op per cardiology - had a bowel infection after left knee , concerned about this again  - Patient  was instructed on what medications to stop prior to surgery. - Follow-up visit in 2 weeks with Dr. Melodi - Begin physical therapy following surgery - Pre-operative lab work as pre-surgical testing - Prescriptions will be provided in hospital at time of discharge  Roxie Mess, PA-C Orthopedic Surgery [facility] [place]

## 2024-10-17 ENCOUNTER — Encounter: Payer: Self-pay | Admitting: Internal Medicine

## 2024-10-17 ENCOUNTER — Ambulatory Visit (INDEPENDENT_AMBULATORY_CARE_PROVIDER_SITE_OTHER): Admitting: Internal Medicine

## 2024-10-17 VITALS — BP 138/72 | HR 72 | Temp 97.7°F | Ht 64.0 in | Wt 173.6 lb

## 2024-10-17 DIAGNOSIS — J069 Acute upper respiratory infection, unspecified: Secondary | ICD-10-CM | POA: Diagnosis not present

## 2024-10-17 LAB — POC COVID19 BINAXNOW: SARS Coronavirus 2 Ag: NEGATIVE

## 2024-10-17 MED ORDER — BENZONATATE 100 MG PO CAPS: 100.0000 mg | ORAL_CAPSULE | Freq: Three times a day (TID) | ORAL | 0 refills | Status: AC | PRN

## 2024-10-17 MED ORDER — ALBUTEROL SULFATE HFA 108 (90 BASE) MCG/ACT IN AERS: 2.0000 | INHALATION_SPRAY | RESPIRATORY_TRACT | 0 refills | Status: AC | PRN

## 2024-10-17 NOTE — Patient Instructions (Addendum)
 Rest, drink plenty of fluids.  Tylenol  for fever, body aches.   For cough: - Take Benzonatate  perles as prescribed  - Can use over the counter Mucinex  DM or Robitussin DM   For sore throat:  Use over-the-counter throat lozenges as needed  Use over-the-counter chloraseptic spray as needed  Warm salt water  gargles   For nasal and sinus congestion: Use over-the-counter Flonase : 2 nasal sprays on each side of the nose in the morning until you feel better  Continue your Cetirizine  (Zyrtec ) once daily    Call if not gradually better over the next  10 days  Call anytime if the symptoms are severe, you have high fever, short of breath, chest pain

## 2024-10-17 NOTE — Progress Notes (Signed)
 [facility]IMARY [facility] [address] [place] [address] Dept: [phone] Dept Fax: [phone]  Acute Care Office Visit  Subjective:   David Goodman 1941/10/02 10/17/2024  Chief Complaint  Patient presents with   Cough    1 week clear mucus, nasal congestion    HPI:  Discussed the use of AI scribe software for clinical note transcription with the patient, who gave verbal consent to proceed.  History of Present Illness   David Goodman is an 83 year old male who presents with a cough and nasal congestion.  He has been experiencing a cough for the past four to five days. Initially sporadic, the cough has increased in frequency, particularly only at night and in the morning. He usually has a slight cough in the morning, but this episode is more pronounced. Occasionally, he coughs up clear sputum. No fever, shortness of breath, or chest pain.  He also experiences nasal congestion, for which he uses a nasal spray that provides relief. There is no worsening of the congestion, and he has not experienced a sore throat, although he feels like there is something present in his throat that does not go away. He has tried honey to alleviate his symptoms without success.  For his cough, he has taken a pill from a previous prescription, and notes that it temporarily alleviates the cough. He has one pill remaining. He states the previous Rx for his albuterol  inhaler did help.    The following portions of the patient's history were reviewed and updated as appropriate: past medical history, past surgical history, family history, social history, allergies, medications, and problem list.   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 09/09/2022   Gait instability 09/09/2022   Acute right-sided low back pain without sciatica 09/03/2022   History of colonic polyps 07/22/2022   Gastro-esophageal reflux disease with esophagitis 07/22/2022   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   OSA on CPAP 12/13/2021   Coronary artery disease involving native coronary artery of native heart 09/21/2021   SOBOE (shortness of breath on exertion) 09/21/2021   HFrEF (heart failure with reduced ejection fraction) (HCC)    Cerebrovascular disease 06/05/2021   Hiatal hernia 05/29/2021   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   Erectile dysfunction due to arterial insufficiency 02/27/2021   Hemorrhoids 07/17/2020   Syncope 11/15/2018   Diverticulitis 05/25/2018   Osteoarthritis of right knee 12/13/2017   OA (osteoarthritis) of knee 12/13/2017   Testosterone  deficiency 03/22/2017   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/27/2016   BPH (benign prostatic hyperplasia) 06/18/2013   HLD (hyperlipidemia) 07/13/2007   Essential hypertension 07/13/2007   Allergic rhinitis 07/13/2007   History of nephrolithiasis 07/13/2007   Past Medical History:  Diagnosis Date   ALLERGIC RHINITIS 07/13/2007   Aorta dilated on Echo; Normal sized Aorta on CT    Chest CT 1/23: No thoracic aortic aneurysm; coronary calcifications; aortic atherosclerosis; improved aeration of lungs with persistent minimal reticular opacities   Arthritis  Blood transfusion without reported diagnosis    CAD (coronary artery disease)    CHF (congestive heart failure) (HCC)    Clotting disorder    Diverticulitis    GERD (gastroesophageal reflux  disease)    GI bleed    Glaucoma    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)    HYPERLIPIDEMIA 07/13/2007   HYPERTENSION 07/13/2007   Impaired glucose tolerance 09/27/2016   Iron deficiency anemia due to chronic blood loss 02/27/2021   NEPHROLITHIASIS, 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   NSVT (nonsustained ventricular tachycardia) (HCC)    PEPTIC ULCER DISEASE 07/13/2007   PVC's (premature ventricular contractions)    Sleep apnea    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   Past Surgical History:  Procedure Laterality Date   BROW LIFT Bilateral 11/10/2020   Procedure: BLEPHAROPLASTY;  Surgeon: Elisabeth Craig RAMAN, MD;  Location: [facility];  Service: Plastics;  Laterality: Bilateral;  1 hour   COLONOSCOPY N/A 04/12/2022   Procedure: COLONOSCOPY;  Surgeon: Burnette Fallow, MD;  Location: [facility];  Service: Gastroenterology;  Laterality: N/A;   COLONOSCOPY WITH PROPOFOL  N/A 02/17/2019   Procedure: COLONOSCOPY WITH PROPOFOL ;  Surgeon: Saintclair Jasper, MD;  Location: [facility];  Service: Gastroenterology;  Laterality: N/A;   CORONARY STENT INTERVENTION N/A 08/25/2021   Procedure: CORONARY STENT INTERVENTION;  Surgeon: Dann Candyce RAMAN, MD;  [facility] INVASIVE CV LAB;  Service: Cardiovascular;  Laterality: N/A;   CORONARY ULTRASOUND/IVUS N/A 08/25/2021   Procedure: Intravascular Ultrasound/IVUS;  Surgeon: Dann Candyce RAMAN, MD;  Location: [facility] INVASIVE CV LAB;  Service: Cardiovascular;  Laterality: N/A;   EYE SURGERY     HYDROCELE EXCISION Right 05/15/2003   IR ANGIOGRAM VISCERAL SELECTIVE  02/16/2019   IR ANGIOGRAM VISCERAL SELECTIVE  02/16/2019   IR ANGIOGRAM VISCERAL SELECTIVE  02/16/2019   IR US  GUIDE VASC ACCESS RIGHT  02/16/2019   JOINT REPLACEMENT     LEFT HEART CATH AND CORONARY ANGIOGRAPHY N/A 07/28/2021   Procedure: LEFT HEART CATH AND CORONARY ANGIOGRAPHY;  Surgeon: Dann Candyce RAMAN, MD;  Location: [facility] INVASIVE CV LAB;  Service:  Cardiovascular;  Laterality: N/A;   LEFT HEART CATH AND CORONARY ANGIOGRAPHY N/A 08/25/2021   Procedure: LEFT HEART CATH AND CORONARY ANGIOGRAPHY;  Surgeon: Dann Candyce RAMAN, MD;  Location: [facility] INVASIVE CV LAB;  Service: Cardiovascular;  Laterality: N/A;   PERIPHERAL INTRAVASCULAR LITHOTRIPSY  08/25/2021   Procedure: INTRAVASCULAR LITHOTRIPSY;  Surgeon: Dann Candyce RAMAN, MD;  Location: [facility] INVASIVE CV LAB;  Service: Cardiovascular;;   ROTATOR CUFF REPAIR     SPERMATOCELECTOMY Right 05/15/2003   TOTAL KNEE ARTHROPLASTY Left 07/11/2023   Procedure: TOTAL KNEE ARTHROPLASTY;  Surgeon: Melodi Lerner, MD;  Location: [facility];  Service: Orthopedics;  Laterality: Left;   Family History  Problem Relation Age of Onset   Cancer Brother     Current Outpatient Medications:    albuterol  (VENTOLIN  HFA) 108 (90 Base) MCG/ACT inhaler, Inhale 2 puffs into the lungs every 4 (four) hours as needed for wheezing or shortness of breath., Disp: 8 g, Rfl: 0   Ascorbic Acid (VITAMIN C) 1000 MG tablet, Take 1,000 mg by mouth 2 (two) times a week., Disp: , Rfl:    benzonatate  (TESSALON ) 100 MG capsule, Take 1 capsule (100 mg total) by mouth 3 (three) times daily as needed for cough., Disp: 30 capsule, Rfl: 0   cetirizine  (ZYRTEC ) 10 MG tablet, Take 1 tablet (10 mg total) by mouth daily., Disp: 90 tablet, Rfl: 2   clotrimazole -betamethasone  (LOTRISONE ) cream, Apply  1 Application topically daily., Disp: 30 g, Rfl: 0   fluticasone  (FLONASE ) 50 MCG/ACT nasal spray, Place 2 sprays into both nostrils daily., Disp: 16 g, Rfl: 6   hydrochlorothiazide  (HYDRODIURIL ) 25 MG tablet, Take 1 tablet (25 mg total) by mouth daily., Disp: 90 tablet, Rfl: 3   hydrOXYzine (ATARAX) 10 MG tablet, Take 1 tablet (10 mg total) by mouth every 6 (six) hours as needed for itching., Disp: 20 tablet, Rfl: 0   latanoprost  (XALATAN ) 0.005 % ophthalmic solution, Place 1 drop into both eyes at bedtime., Disp: 7.5 mL, Rfl: 1   MAGNESIUM  PO, Take 400  mg by mouth at bedtime., Disp: , Rfl:    nitroGLYCERIN  (NITROSTAT ) 0.4 MG SL tablet, Place 1 tablet (0.4 mg total) under the tongue every 5 (five) minutes as needed for chest pain., Disp: 25 tablet, Rfl: 3   pantoprazole  (PROTONIX ) 40 MG tablet, Take 1 tablet by mouth once daily, Disp: 90 tablet, Rfl: 1   Polyvinyl Alcohol -Povidone (REFRESH OP), Place 1 drop into the left eye 2 (two) times daily as needed (dryness)., Disp: , Rfl:    psyllium (METAMUCIL) 58.6 % powder, Take 1 packet by mouth daily., Disp: , Rfl:    rosuvastatin  (CRESTOR ) 20 MG tablet, Take 1 tablet (20 mg total) by mouth daily., Disp: 90 tablet, Rfl: 0   tadalafil  (CIALIS ) 10 MG tablet, Take 1 tablet (10 mg total) by mouth every other day as needed for erectile dysfunction., Disp: 10 tablet, Rfl: 1   tamsulosin  (FLOMAX ) 0.4 MG CAPS capsule, TAKE 1 CAPSULE BY MOUTH DAILY, Disp: 100 capsule, Rfl: 2   valsartan  (DIOVAN ) 320 MG tablet, Take 1 tablet by mouth once daily, Disp: 90 tablet, Rfl: 2   zinc gluconate 50 MG tablet, Take 50 mg by mouth daily., Disp: , Rfl:    triamcinolone  cream (KENALOG ) 0.1 %, Apply 1 Application topically 2 (two) times daily. (Patient not taking: Reported on 10/17/2024), Disp: 453.6 g, Rfl: 0 No Known Allergies   ROS: A complete ROS was performed with pertinent positives/negatives noted in the HPI. The remainder of the ROS are negative.    Objective:   Today's Vitals   10/17/24 1007  BP: 138/72  Pulse: 72  Temp: 97.7 F (36.5 C)  TempSrc: Temporal  SpO2: 94%  Weight: 173 lb 9.6 oz (78.7 kg)  Height: 5' 4 (1.626 m)    GENERAL: Well-appearing, in NAD. Well nourished.  SKIN: Pink, warm and dry. No rash.  HEENT:    HEAD: Normocephalic, non-traumatic.  EYES: Conjunctive pink without exudate. \ EARS: External ear w/o redness, swelling, masses, or lesions. EAC clear. TM's intact, translucent w/o bulging, appropriate landmarks visualized.  NOSE: Septum midline w/o deformity. Nares patent, mucosa  pink and inflamed w/o drainage. No sinus tenderness.  THROAT: Uvula midline. Oropharynx clear. Tonsils non-inflamed w/o exudate . Mucus membranes pink and moist.  NECK: Trachea midline. Full ROM w/o pain or tenderness. No lymphadenopathy.  RESPIRATORY: Chest wall symmetrical. Respirations even and non-labored. Breath sounds clear to auscultation bilaterally. (+) mild predominately dry cough CARDIAC: S1, S2 present, regular rate and rhythm. Peripheral pulses 2+ bilaterally.  EXTREMITIES: Without clubbing, cyanosis, or edema.  NEUROLOGIC: Steady, even gait.  PSYCH/MENTAL STATUS: Alert, oriented x 3. Cooperative, appropriate mood and affect.    Results for orders placed or performed in visit on 10/17/24  POC COVID-19  Result Value Ref Range   SARS Coronavirus 2 Ag Negative Negative      Assessment & Plan:  Assessment and Plan  Acute upper respiratory infection - Prescribed Tessalon  Perles 100 mg TID PRN for cough. - Advised albuterol  two puffs every four hours PRN for shortness of breath or wheezing. - Recommended Mucinex  DM or Robitussin DM for cough. - Continue Flonase  and Zyrtec  for nasal congestion. - Follow up if symptoms worsen or fail to improve.       Meds ordered this encounter  Medications   benzonatate  (TESSALON ) 100 MG capsule    Sig: Take 1 capsule (100 mg total) by mouth 3 (three) times daily as needed for cough.    Dispense:  30 capsule    Refill:  0    Supervising Provider:   THOMPSON, AARON B [[id number]]   albuterol  (VENTOLIN  HFA) 108 (90 Base) MCG/ACT inhaler    Sig: Inhale 2 puffs into the lungs every 4 (four) hours as needed for wheezing or shortness of breath.    Dispense:  8 g    Refill:  0    Supervising Provider:   THOMPSON, AARON B [[id number]]   Orders Placed This Encounter  Procedures   POC COVID-19   Lab Orders         POC COVID-19     No images are attached to the encounter or orders placed in the encounter.  Return if symptoms worsen or fail  to improve.   Rosina Senters, FNP

## 2024-10-19 ENCOUNTER — Ambulatory Visit: Payer: Self-pay

## 2024-10-19 ENCOUNTER — Other Ambulatory Visit: Payer: Self-pay | Admitting: Family Medicine

## 2024-10-19 DIAGNOSIS — J069 Acute upper respiratory infection, unspecified: Secondary | ICD-10-CM

## 2024-10-19 MED ORDER — HYDROCODONE BIT-HOMATROP MBR 5-1.5 MG/5ML PO SOLN: 5.0000 mL | Freq: Three times a day (TID) | ORAL | 0 refills | Status: DC | PRN

## 2024-10-19 NOTE — Patient Instructions (Signed)
 SURGICAL WAITING ROOM VISITATION  Patients having surgery or a procedure may have no more than 2 support people in the waiting area - these visitors may rotate.    Children under the age of 20 must have an adult with them who is not the patient.  Visitors with respiratory illnesses are discouraged from visiting and should remain at home.  If the patient needs to stay at the hospital during part of their recovery, the visitor guidelines for inpatient rooms apply. Pre-op nurse will coordinate an appropriate time for 1 support person to accompany patient in pre-op.  This support person may not rotate.    Please refer to the [facility] website for the visitor guidelines for Inpatients (after your surgery is over and you are in a regular room).       Your procedure is scheduled on: 11/05/24   Report to [facility] Main Entrance    Report to admitting at 5:15 AM   Call this number if you have problems the morning of surgery [phone]   Do not eat food :After Midnight.   After Midnight you may have the following liquids until 4:15 AM DAY OF SURGERY  Water  Non-Citrus Juices (without pulp, NO RED-Apple, White grape, White cranberry) Black Coffee (NO MILK/CREAM OR CREAMERS, sugar ok)  Clear Tea (NO MILK/CREAM OR CREAMERS, sugar ok) regular and decaf                             Plain Jell-O (NO RED)                                           Fruit ices (not with fruit pulp, NO RED)                                     Popsicles (NO RED)                                                               Sports drinks like Gatorade (NO RED)                   The day of surgery:  Drink ONE (1) Pre-Surgery Clear Ensure at 4:15 AM the morning of surgery. Drink in one sitting. Do not sip.  This drink was given to you during your hospital  pre-op appointment visit. Nothing else to drink after completing the  Pre-Surgery Clear Ensure.        If you have questions, please contact your  surgeon's office.   Oral Hygiene is also important to reduce your risk of infection.                                    Remember - BRUSH YOUR TEETH THE MORNING OF SURGERY WITH YOUR REGULAR TOOTHPASTE  DENTURES WILL BE REMOVED PRIOR TO SURGERY PLEASE DO NOT APPLY Poly grip OR ADHESIVES!!!     Stop all vitamins and herbal supplements 7 days before surgery.  Take these medicines the morning of surgery with A SIP OF WATER : cetirizine (zyrtec ), pantoprazole , rosuvastatin , tamsulosin , nasal spray, eye drops, inhaler  Bring CPAP mask and tubing day of surgery.                              You may not have any metal on your body including hair pins, jewelry, and body piercing             Do not wear make-up, lotions, powders, perfumes/cologne, or deodorant              Men may shave face and neck.   Do not bring valuables to the hospital. [facility] IS NOT             RESPONSIBLE   FOR VALUABLES.   Contacts, glasses, dentures or bridgework may not be worn into surgery.   Bring small overnight bag day of surgery.   DO NOT BRING YOUR HOME MEDICATIONS TO THE HOSPITAL. PHARMACY WILL DISPENSE MEDICATIONS LISTED ON YOUR MEDICATION LIST TO YOU DURING YOUR ADMISSION IN THE HOSPITAL!    Patients discharged on the day of surgery will not be allowed to drive home.  Someone NEEDS to stay with you for the first 24 hours after anesthesia.   Special Instructions: Bring a copy of your healthcare power of attorney and living will documents the day of surgery if you haven't scanned them before.              Please read over the following fact sheets you were given: IF YOU HAVE QUESTIONS ABOUT YOUR PRE-OP INSTRUCTIONS PLEASE CALL [phone] [place]   If you received a COVID test during your pre-op visit  it is requested that you wear a mask when out in public, stay away from anyone that may not be feeling well and notify your surgeon if you develop symptoms. If you test positive for Covid or have been  in contact with anyone that has tested positive in the last 10 days please notify you surgeon.      Pre-operative 4 CHG Bath Instructions  DYNA-Hex 4 Chlorhexidine  Gluconate 4% Solution Antiseptic 4 fl. oz   You can play a key role in reducing the risk of infection after surgery. Your skin needs to be as free of germs as possible. You can reduce the number of germs on your skin by washing with CHG (chlorhexidine  gluconate) soap before surgery. CHG is an antiseptic soap that kills germs and continues to kill germs even after washing.   DO NOT use if you have an allergy  to chlorhexidine /CHG or antibacterial soaps. If your skin becomes reddened or irritated, stop using the CHG and notify one of our RNs at   Please shower with the CHG soap starting 4 days before surgery using the following schedule:     Please keep in mind the following:  DO NOT shave, including legs and underarms, starting the day of your first shower.   You may shave your face at any point before/day of surgery.  Place clean sheets on your bed the day you start using CHG soap. Use a clean washcloth (not used since being washed) for each shower. DO NOT sleep with pets once you start using the CHG.  CHG Shower Instructions:  If you choose to wash your hair and private area, wash first with your normal shampoo/soap.  After you use shampoo/soap, rinse your hair and body thoroughly to remove  shampoo/soap residue.  Turn the water  OFF and apply about 3 tablespoons (45 ml) of CHG soap to a CLEAN washcloth.  Apply CHG soap ONLY FROM YOUR NECK DOWN TO YOUR TOES (washing for 3-5 minutes)  DO NOT use CHG soap on face, private areas, open wounds, or sores.  Pay special attention to the area where your surgery is being performed.  If you are having back surgery, having someone wash your back for you may be helpful. Wait 2 minutes after CHG soap is applied, then you may rinse off the CHG soap.  Pat dry with a clean towel  Put on clean  clothes/pajamas   If you choose to wear lotion, please use ONLY the CHG-compatible lotions on the back of this paper.     Additional instructions for the day of surgery: DO NOT APPLY any lotions, deodorants, cologne, or perfumes.   Put on clean/comfortable clothes.  Brush your teeth.  Ask your nurse before applying any prescription medications to the skin.   CHG Compatible Lotions   Aveeno Moisturizing lotion  Cetaphil Moisturizing Cream  Cetaphil Moisturizing Lotion  Clairol Herbal Essence Moisturizing Lotion, Dry Skin  Clairol Herbal Essence Moisturizing Lotion, Extra Dry Skin  Clairol Herbal Essence Moisturizing Lotion, Normal Skin  Curel Age Defying Therapeutic Moisturizing Lotion with Alpha Hydroxy  Curel Extreme Care Body Lotion  Curel Soothing Hands Moisturizing Hand Lotion  Curel Therapeutic Moisturizing Cream, Fragrance-Free  Curel Therapeutic Moisturizing Lotion, Fragrance-Free  Curel Therapeutic Moisturizing Lotion, Original Formula  Eucerin Daily Replenishing Lotion  Eucerin Dry Skin Therapy Plus Alpha Hydroxy Crme  Eucerin Dry Skin Therapy Plus Alpha Hydroxy Lotion  Eucerin Original Crme  Eucerin Original Lotion  Eucerin Plus Crme Eucerin Plus Lotion  Eucerin TriLipid Replenishing Lotion  Keri Anti-Bacterial Hand Lotion  Keri Deep Conditioning Original Lotion Dry Skin Formula Softly Scented  Keri Deep Conditioning Original Lotion, Fragrance Free Sensitive Skin Formula  Keri Lotion Fast Absorbing Fragrance Free Sensitive Skin Formula  Keri Lotion Fast Absorbing Softly Scented Dry Skin Formula  Keri Original Lotion  Keri Skin Renewal Lotion Keri Silky Smooth Lotion  Keri Silky Smooth Sensitive Skin Lotion  Nivea Body Creamy Conditioning Oil  Nivea Body Extra Enriched Lotion  Nivea Body Original Lotion  Nivea Body Sheer Moisturizing Lotion Nivea Crme  Nivea Skin Firming Lotion  NutraDerm 30 Skin Lotion  NutraDerm Skin Lotion  NutraDerm Therapeutic Skin  Cream  NutraDerm Therapeutic Skin Lotion  ProShield Protective Hand Cream  Incentive Spirometer  An incentive spirometer is a tool that can help keep your lungs clear and active. This tool measures how well you are filling your lungs with each breath. Taking long deep breaths may help reverse or decrease the chance of developing breathing (pulmonary) problems (especially infection) following: A long period of time when you are unable to move or be active. BEFORE THE PROCEDURE  If the spirometer includes an indicator to show your best effort, your nurse or respiratory therapist will set it to a desired goal. If possible, sit up straight or lean slightly forward. Try not to slouch. Hold the incentive spirometer in an upright position. INSTRUCTIONS FOR USE  Sit on the edge of your bed if possible, or sit up as far as you can in bed or on a chair. Hold the incentive spirometer in an upright position. Breathe out normally. Place the mouthpiece in your mouth and seal your lips tightly around it. Breathe in slowly and as deeply as possible, raising the piston or the ball  toward the top of the column. Hold your breath for 3-5 seconds or for as long as possible. Allow the piston or ball to fall to the bottom of the column. Remove the mouthpiece from your mouth and breathe out normally. Rest for a few seconds and repeat Steps 1 through 7 at least 10 times every 1-2 hours when you are awake. Take your time and take a few normal breaths between deep breaths. The spirometer may include an indicator to show your best effort. Use the indicator as a goal to work toward during each repetition. After each set of 10 deep breaths, practice coughing to be sure your lungs are clear. If you have an incision (the cut made at the time of surgery), support your incision when coughing by placing a pillow or rolled up towels firmly against it. Once you are able to get out of bed, walk around indoors and cough well. You  may stop using the incentive spirometer when instructed by your caregiver.  RISKS AND COMPLICATIONS Take your time so you do not get dizzy or light-headed. If you are in pain, you may need to take or ask for pain medication before doing incentive spirometry. It is harder to take a deep breath if you are having pain. AFTER USE Rest and breathe slowly and easily. It can be helpful to keep track of a log of your progress. Your caregiver can provide you with a simple table to help with this. If you are using the spirometer at home, follow these instructions: SEEK MEDICAL CARE IF:  You are having difficultly using the spirometer. You have trouble using the spirometer as often as instructed. Your pain medication is not giving enough relief while using the spirometer. You develop fever of 100.5 F (38.1 C) or higher. SEEK IMMEDIATE MEDICAL CARE IF:  You cough up bloody sputum that had not been present before. You develop fever of 102 F (38.9 C) or greater. You develop worsening pain at or near the incision site. MAKE SURE YOU:  Understand these instructions. Will watch your condition. Will get help right away if you are not doing well or get worse. Document Released: 03/28/2007 Document Revised: 02/07/2012 Document Reviewed: 05/29/2007 [facility] Patient Information 2014 [place], [place].

## 2024-10-19 NOTE — Telephone Encounter (Signed)
 Called patient and informed him that Dr. Thedora sent the Rx for the cough medication to his local pharmacy [facility]. I also advised patient that per Dr. Thedora if the cough is not better over the weekend to call the office to get evaluated or to go to the nearest urgent care and/or ED. Patient verbalized understanding and all (if any) questions were answered.

## 2024-10-19 NOTE — Progress Notes (Addendum)
 COVID Vaccine received:  []  No [x]  Yes Date of any COVID positive Test in last 90 days: no PCP - Garnette simpler MD Cardiologist -   Chest x-ray - 11/10/23 Epic EKG - 01/25/24 epic  Stress Test -  ECHO - 02/20/24 Epic Cardiac Cath - 08/25/21 Epic  Medical clearance 09/11/24-Dr. Simpler Cardiac clearance -10/09/24- Barnie Hila NP  Bowel Prep - [x]  No  []   Yes ______  Pacemaker / ICD device [x]  No []  Yes   Spinal Cord Stimulator:[x]  No []  Yes       History of Sleep Apnea? [x]  No []  Yes   CPAP used?- [x]  No []  Yes    Does the patient monitor blood sugar?          [x]  No []  Yes  []  N/A  Patient has: [x]  NO Hx DM   []  Pre-DM                 []  DM1  []   DM2 Does patient have a [employer] or Dexacom? []  No []  Yes   Fasting Blood Sugar Ranges-  Checks Blood Sugar _____ times a day  GLP1 agonist / usual dose - no GLP1 instructions:  SGLT-2 inhibitors / usual dose - no SGLT-2 instructions:   Blood Thinner / Instructions:no Aspirin  Instructions:ASA 81 mg will stop 5 days before surgery. Last dose to be 10/30/24 Comments:   Activity level: Patient is able to climb a flight of stairs without difficulty; [x]  No CP  [x]  No SOB,    Patient can   Anesthesia review: HTN, CAD, CHF, OSA, Seen @ [facility] 10/22/24 after leaving PST w/ wheezing.  Patient denies shortness of breath, fever, cough and chest pain at PAT appointment.  Patient verbalized understanding and agreement to the Pre-Surgical Instructions that were given to them at this PAT appointment. Patient was also educated of the need to review these PAT instructions again prior to his/her surgery.I reviewed the appropriate phone numbers to call if they have any and questions or concerns.

## 2024-10-19 NOTE — Telephone Encounter (Signed)
 FYI Only or Action Required?: Action required by provider: request for appointment, medication refill request, and clinical question for provider.  Patient was last seen in primary care on 10/17/2024 by Billy Knee, FNP.  Called Nurse Triage reporting Cough.  Symptoms began several days ago.  Interventions attempted: OTC medications: cough med, albuterol .  Symptoms are: gradually worsening.  Triage Disposition: See Physician Within 24 Hours  Patient/caregiver understands and will follow disposition?: No, wishes to speak with PCP  Copied from CRM #[id number]. Topic: Clinical - Red Word Triage >> Oct 19, 2024  9:49 AM Alfonso ORN wrote: Red Word that prompted transfer to Nurse Triage: worsening cough ,   pt says medication prescribed isnt working  and is requesting hydrocodone  previously prescribed Reason for Disposition  [1] Continuous (nonstop) coughing interferes with work or school AND [2] no improvement using cough treatment per Care Advice  Answer Assessment - Initial Assessment Questions No available appts with pcp. Offered alternative provider, patient declined. Advised UC today and ED if symptoms worsen.  Patient requesting cough medication and or appt pcp clinic.  1. ONSET: When did the cough begin?      LOV 10/17/24 2. SEVERITY: How bad is the cough today?      Severe cough; getting worse; taking OTC cough medicine 3. SPUTUM: Describe the color of your sputum (e.g., none, dry cough; clear, white, yellow, green)     clear 4. HEMOPTYSIS: Are you coughing up any blood? If Yes, ask: How much? (e.g., flecks, streaks, tablespoons, etc.)     no 5. DIFFICULTY BREATHING: Are you having difficulty breathing? If Yes, ask: How bad is it? (e.g., mild, moderate, severe)      no 6. FEVER: Do you have a fever? If Yes, ask: What is your temperature, how was it measured, and when didit start?     101 temp last night;currently 98.9 7. CARDIAC HISTORY: Do you have any  history of heart disease? (e.g., heart attack, congestive heart failure)      no 8. LUNG HISTORY: Do you have any history of lung disease?  (e.g., pulmonary embolus, asthma, emphysema)     no 9. PE RISK FACTORS: Do you have a history of blood clots? (or: recent major surgery, recent prolonged travel, bedridden)     no 10. OTHER SYMPTOMS: Do you have any other symptoms? (e.g., runny nose, wheezing, chest pain)       Wheezing, dizziness when coughing Denies chest pain,sob  Protocols used: Cough - Acute Productive-A-AH

## 2024-10-19 NOTE — Addendum Note (Signed)
 Addended by: THEDORA GARNETTE HERO on: 10/19/2024 04:45 PM   Modules accepted: Orders

## 2024-10-22 ENCOUNTER — Ambulatory Visit: Payer: Self-pay

## 2024-10-22 ENCOUNTER — Emergency Department (HOSPITAL_BASED_OUTPATIENT_CLINIC_OR_DEPARTMENT_OTHER)
Admission: EM | Admit: 2024-10-22 | Discharge: 2024-10-22 | Disposition: A | Discharge status: Home / Self Care | Source: Ambulatory Visit

## 2024-10-22 ENCOUNTER — Encounter (HOSPITAL_COMMUNITY): Payer: Self-pay

## 2024-10-22 ENCOUNTER — Emergency Department (HOSPITAL_BASED_OUTPATIENT_CLINIC_OR_DEPARTMENT_OTHER)

## 2024-10-22 ENCOUNTER — Other Ambulatory Visit: Payer: Self-pay

## 2024-10-22 ENCOUNTER — Encounter (HOSPITAL_COMMUNITY)
Admission: RE | Admit: 2024-10-22 | Discharge: 2024-10-22 | Disposition: A | Discharge status: Home / Self Care | Source: Ambulatory Visit | Attending: Orthopedic Surgery | Admitting: Orthopedic Surgery

## 2024-10-22 VITALS — BP 151/77 | HR 90 | Temp 98.6°F | Resp 22 | Ht 64.5 in | Wt 174.0 lb

## 2024-10-22 DIAGNOSIS — R600 Localized edema: Secondary | ICD-10-CM | POA: Insufficient documentation

## 2024-10-22 DIAGNOSIS — R062 Wheezing: Secondary | ICD-10-CM | POA: Insufficient documentation

## 2024-10-22 DIAGNOSIS — J4541 Moderate persistent asthma with (acute) exacerbation: Secondary | ICD-10-CM

## 2024-10-22 DIAGNOSIS — R0602 Shortness of breath: Secondary | ICD-10-CM | POA: Insufficient documentation

## 2024-10-22 DIAGNOSIS — I10 Essential (primary) hypertension: Secondary | ICD-10-CM | POA: Insufficient documentation

## 2024-10-22 DIAGNOSIS — R059 Cough, unspecified: Secondary | ICD-10-CM | POA: Insufficient documentation

## 2024-10-22 DIAGNOSIS — Z01812 Encounter for preprocedural laboratory examination: Secondary | ICD-10-CM | POA: Insufficient documentation

## 2024-10-22 DIAGNOSIS — Z01818 Encounter for other preprocedural examination: Secondary | ICD-10-CM

## 2024-10-22 DIAGNOSIS — J40 Bronchitis, not specified as acute or chronic: Secondary | ICD-10-CM

## 2024-10-22 HISTORY — DX: Personal history of other diseases of the digestive system: Z87.19

## 2024-10-22 LAB — CBC
HCT: 41.7 % (ref 39.0–52.0)
HCT: 40.8 % (ref 39.0–52.0)
Hemoglobin: 14 g/dL (ref 13.0–17.0)
Hemoglobin: 14.1 g/dL (ref 13.0–17.0)
MCH: 29.8 pg (ref 26.0–34.0)
MCH: 30.2 pg (ref 26.0–34.0)
MCHC: 33.6 g/dL (ref 30.0–36.0)
MCHC: 34.6 g/dL (ref 30.0–36.0)
MCV: 88.7 fL (ref 80.0–100.0)
MCV: 87.4 fL (ref 80.0–100.0)
Platelets: 257 K/uL (ref 150–400)
Platelets: 261 K/uL (ref 150–400)
RBC: 4.7 MIL/uL (ref 4.22–5.81)
RBC: 4.67 MIL/uL (ref 4.22–5.81)
RDW: 13.7 % (ref 11.5–15.5)
RDW: 13.5 % (ref 11.5–15.5)
WBC: 11 K/uL — ABNORMAL HIGH (ref 4.0–10.5)
WBC: 11.8 K/uL — ABNORMAL HIGH (ref 4.0–10.5)
nRBC: 0 % (ref 0.0–0.2)
nRBC: 0 % (ref 0.0–0.2)

## 2024-10-22 LAB — BASIC METABOLIC PANEL WITH GFR
Anion gap: 13 (ref 5–15)
Anion gap: 14 (ref 5–15)
BUN: 29 mg/dL — ABNORMAL HIGH (ref 8–23)
BUN: 26 mg/dL — ABNORMAL HIGH (ref 8–23)
CO2: 24 mmol/L (ref 22–32)
CO2: 25 mmol/L (ref 22–32)
Calcium: 9.4 mg/dL (ref 8.9–10.3)
Calcium: 9.3 mg/dL (ref 8.9–10.3)
Chloride: 102 mmol/L (ref 98–111)
Chloride: 101 mmol/L (ref 98–111)
Creatinine, Ser: 1.22 mg/dL (ref 0.61–1.24)
Creatinine, Ser: 1.35 mg/dL — ABNORMAL HIGH (ref 0.61–1.24)
GFR, Estimated: 59 mL/min — ABNORMAL LOW (ref >60)
GFR, Estimated: 52 mL/min — ABNORMAL LOW (ref >60)
Glucose, Bld: 106 mg/dL — ABNORMAL HIGH (ref 70–99)
Glucose, Bld: 96 mg/dL (ref 70–99)
Potassium: 3.9 mmol/L (ref 3.5–5.1)
Potassium: 3.9 mmol/L (ref 3.5–5.1)
Sodium: 139 mmol/L (ref 135–145)
Sodium: 140 mmol/L (ref 135–145)

## 2024-10-22 LAB — PRO BRAIN NATRIURETIC PEPTIDE: Pro Brain Natriuretic Peptide: 168 pg/mL (ref <300.0)

## 2024-10-22 LAB — RESP PANEL BY RT-PCR (RSV, FLU A&B, COVID)  RVPGX2
Influenza A by PCR: NEGATIVE
Influenza B by PCR: NEGATIVE
Resp Syncytial Virus by PCR: NEGATIVE
SARS Coronavirus 2 by RT PCR: NEGATIVE

## 2024-10-22 LAB — SURGICAL PCR SCREEN
MRSA, PCR: NEGATIVE
Staphylococcus aureus: NEGATIVE

## 2024-10-22 LAB — TROPONIN T, HIGH SENSITIVITY
Troponin T High Sensitivity: 27 ng/L — ABNORMAL HIGH (ref 0–19)
Troponin T High Sensitivity: 27 ng/L — ABNORMAL HIGH (ref 0–19)

## 2024-10-22 MED ORDER — METHYLPREDNISOLONE SODIUM SUCC 125 MG IJ SOLR
125.0000 mg | Freq: Once | INTRAMUSCULAR | Status: AC
  Administered 2024-10-22: 125 mg via INTRAVENOUS
  Filled 2024-10-22: qty 2

## 2024-10-22 MED ORDER — PREDNISONE 10 MG PO TABS: 40.0000 mg | ORAL_TABLET | Freq: Every day | ORAL | 0 refills | Status: DC

## 2024-10-22 MED ORDER — ALBUTEROL SULFATE (5 MG/ML) 0.5% IN NEBU: 2.5000 mg | INHALATION_SOLUTION | Freq: Four times a day (QID) | RESPIRATORY_TRACT | 12 refills | Status: AC | PRN

## 2024-10-22 MED ORDER — IPRATROPIUM-ALBUTEROL 0.5-2.5 (3) MG/3ML IN SOLN
3.0000 mL | Freq: Once | RESPIRATORY_TRACT | Status: AC
  Administered 2024-10-22: 3 mL via RESPIRATORY_TRACT
  Filled 2024-10-22: qty 3

## 2024-10-22 MED ORDER — IPRATROPIUM-ALBUTEROL 0.5-2.5 (3) MG/3ML IN SOLN
RESPIRATORY_TRACT | Status: AC
  Administered 2024-10-22: 3 mL
  Filled 2024-10-22: qty 3

## 2024-10-22 MED ORDER — ALBUTEROL SULFATE (2.5 MG/3ML) 0.083% IN NEBU
INHALATION_SOLUTION | RESPIRATORY_TRACT | Status: AC
  Administered 2024-10-22: 2.5 mg
  Filled 2024-10-22: qty 3

## 2024-10-22 NOTE — ED Triage Notes (Signed)
 Started coughing Tuesday Cough and wheezing has gotten cough. Congested cough Audible wheezing. Denies pain Seen by PCP x 2 and given Mucinex  DM cough med , cough med and inhaler.Worse today

## 2024-10-22 NOTE — ED Provider Notes (Addendum)
 Patient's respiratory panel negative.  Delta troponins both 27 so no significant change there.  Chest ray without any acute findings.  Patient BNP normal.  Will discussed with patient see how he is doing.   Blia Totman, MD 10/22/24 1658  Patient without any wheezing currently.  Oxygen saturations on room air 93%.  Patient really wants to go home.  Does have a nebulizer machine at home so we will give him some refills for that.  He does have an albuterol  inhaler at home as well.  Recommend he use 1 or the other and we will treat him with prednisone  for the next 5 days 40 mg.  Also patient will return for any new or worse symptoms.  Did recommend Delsym  12 hours to help suppress the cough.   Dessiree Sze, MD 10/22/24 [phone]

## 2024-10-22 NOTE — Telephone Encounter (Signed)
 FYI Only or Action Required?: FYI only for provider: UC evaluation advised.  Patient was last seen in primary care on 10/17/2024 by Billy Knee, FNP.  Called Nurse Triage reporting Cough.  Symptoms began a week ago.  Interventions attempted: Nothing.  Symptoms are: unchanged.  Triage Disposition: See HCP Within 4 Hours (Or PCP Triage)  Patient/caregiver understands and will follow disposition?: Yes, will follow disposition     Reason for Triage: pt called because he was going for pre surgery appt and was told his lungs are whistling. He will like to be seen today. Call back number [phone]   Reason for Disposition  Wheezing is present  Answer Assessment - Initial Assessment Questions 1. ONSET: When did the cough begin?      Over a week 2. SEVERITY: How bad is the cough today?      Sometimes severe 3. SPUTUM: Describe the color of your sputum (e.g., none, dry cough; clear, white, yellow, green)     clear 4. HEMOPTYSIS: Are you coughing up any blood? If Yes, ask: How much? (e.g., flecks, streaks, tablespoons, etc.)     denies 5. DIFFICULTY BREATHING: Are you having difficulty breathing? If Yes, ask: How bad is it? (e.g., mild, moderate, severe)      denies 6. FEVER: Do you have a fever? If Yes, ask: What is your temperature, how was it measured, and when did it start?     denies 7. CARDIAC HISTORY: Do you have any history of heart disease? (e.g., heart attack, congestive heart failure)      Thing put into my artery 8. LUNG HISTORY: Do you have any history of lung disease?  (e.g., pulmonary embolus, asthma, emphysema)     denies 9. PE RISK FACTORS: Do you have a history of blood clots? (or: recent major surgery, recent prolonged travel, bedridden)     denies 10. OTHER SYMPTOMS: Do you have any other symptoms? (e.g., runny nose, wheezing, chest pain)       Lungs whistle, denies CP,  Pt denies difficulty breathing but can be heard wheezing  by the nurse on the phone. Pt was advised to go to UC as no appts available today, pt agreeable.  Protocols used: Cough - Acute Productive-A-AH

## 2024-10-22 NOTE — ED Notes (Signed)
 RT assessed in waiting room prior to triage. BBS audible exp wheezes, SAT 94%

## 2024-10-22 NOTE — Progress Notes (Signed)
 Pt. Arrived for his PST appointment. He had inspiratory and expiratory wheezing. VSS. RR 22. His O2 sat 93%. Skin pink. Pt. Denies feeling SOB. He said he does have an inhaler at home that he uses. Harlene PA was notified and saw pt. It was recommended to pt. To make an appointment with his PCP and if he couldn't get in today to go to Urgent Care. He verbalized understanding.

## 2024-10-22 NOTE — Telephone Encounter (Signed)
 Noted. Dm/cma

## 2024-10-22 NOTE — ED Provider Notes (Signed)
 [name] Provider Note   CSN: [id number] Arrival date & time: 10/22/24  1323     Patient presents with: Wheezing   David Goodman is a 83 y.o. male.   This is a 83 year old male presenting emergency department for shortness of breath, cough and wheezing.  Symptoms started Tuesday.  Some rhinorrhea congestion as well.  Notes using inhaler at home with no improvement.  Cough worse today.  Not having chest pain.  Does note that legs feel more swollen.   Wheezing      Prior to Admission medications   Medication Sig Start Date End Date Taking? Authorizing Provider  albuterol  (VENTOLIN  HFA) 108 (90 Base) MCG/ACT inhaler Inhale 2 puffs into the lungs every 4 (four) hours as needed for wheezing or shortness of breath. 10/17/24   Billy Knee, FNP  Ascorbic Acid (VITAMIN C) 1000 MG tablet Take 1,000 mg by mouth 2 (two) times a week.    [provider]  benzonatate  (TESSALON ) 100 MG capsule Take 1 capsule (100 mg total) by mouth 3 (three) times daily as needed for cough. 10/17/24   Billy Knee, FNP  cetirizine  (ZYRTEC ) 10 MG tablet Take 1 tablet (10 mg total) by mouth daily. 09/11/24   Thedora Garnette HERO, MD  clotrimazole -betamethasone  (LOTRISONE ) cream Apply 1 Application topically daily. Patient not taking: Reported on 10/17/2024 09/11/24   Thedora Garnette HERO, MD  fluticasone  (FLONASE ) 50 MCG/ACT nasal spray Place 2 sprays into both nostrils daily. Patient taking differently: Place 2 sprays into both nostrils daily as needed for allergies. 09/11/24   Thedora Garnette HERO, MD  hydrochlorothiazide  (HYDRODIURIL ) 25 MG tablet Take 1 tablet (25 mg total) by mouth daily. 01/25/24   Lucien Orren SAILOR, PA-C  HYDROcodone  bit-homatropine (HYCODAN) 5-1.5 MG/5ML syrup Take 5 mLs by mouth every 8 (eight) hours as needed for cough. 10/19/24   Thedora Garnette HERO, MD  hydrOXYzine (ATARAX) 10 MG tablet Take 1 tablet (10 mg total) by mouth every 6 (six) hours as  needed for itching. 09/15/24   Long, Joshua G, MD  latanoprost  (XALATAN ) 0.005 % ophthalmic solution Place 1 drop into both eyes at bedtime. 06/20/17   Jame Maude FALCON, MD  Magnesium  400 MG TABS Take 400 mg by mouth at bedtime.    [provider]  nitroGLYCERIN  (NITROSTAT ) 0.4 MG SL tablet Place 1 tablet (0.4 mg total) under the tongue every 5 (five) minutes as needed for chest pain. 07/22/21   Dann Candyce RAMAN, MD  pantoprazole  (PROTONIX ) 40 MG tablet Take 1 tablet by mouth once daily 08/08/24   Conte, Tessa N, PA-C  Polyvinyl Alcohol -Povidone (REFRESH OP) Place 1 drop into the left eye 2 (two) times daily as needed (dryness).    [provider]  psyllium (METAMUCIL) 58.6 % powder Take 1 packet by mouth daily.    [provider]  rosuvastatin  (CRESTOR ) 20 MG tablet Take 1 tablet (20 mg total) by mouth daily. 09/11/24   Thedora Garnette HERO, MD  tadalafil  (CIALIS ) 10 MG tablet Take 1 tablet (10 mg total) by mouth every other day as needed for erectile dysfunction. 09/11/24   Thedora Garnette HERO, MD  tamsulosin  (FLOMAX ) 0.4 MG CAPS capsule TAKE 1 CAPSULE BY MOUTH DAILY 10/08/24   Thedora Garnette HERO, MD  triamcinolone  cream (KENALOG ) 0.1 % Apply 1 Application topically 2 (two) times daily. Patient not taking: Reported on 10/17/2024 05/04/24   Nche, Roselie Rockford, NP  valsartan  (DIOVAN ) 320 MG tablet Take 1 tablet by  mouth once daily 03/30/24   Dann Candyce RAMAN, MD  zinc gluconate 50 MG tablet Take 50 mg by mouth daily.    [provider]    Allergies: Patient has no known allergies.    Review of Systems  Respiratory:  Positive for wheezing.     Updated Vital Signs BP (!) 95/58   Pulse 80   Temp (!) 96.6 F (35.9 C)   Resp (!) 28   Wt 78.9 kg   SpO2 90%   BMI 29.41 kg/m   Physical Exam Vitals and nursing note reviewed.  Constitutional:      Appearance: He is obese.  HENT:     Nose: Nose normal.     Mouth/Throat:     Mouth: Mucous membranes are moist.   Eyes:     Conjunctiva/sclera: Conjunctivae normal.  Cardiovascular:     Rate and Rhythm: Normal rate.     Pulses: Normal pulses.  Pulmonary:     Breath sounds: Wheezing present.  Abdominal:     General: Abdomen is flat. There is no distension.     Tenderness: There is no abdominal tenderness. There is no guarding or rebound.  Musculoskeletal:     Right lower leg: Edema present.     Left lower leg: Edema present.  Skin:    General: Skin is warm and dry.     Capillary Refill: Capillary refill takes less than 2 seconds.  Neurological:     Mental Status: He is alert and oriented to person, place, and time.  Psychiatric:        Mood and Affect: Mood normal.        Behavior: Behavior normal.     (all labs ordered are listed, but only abnormal results are displayed) Labs Reviewed  CBC - Abnormal; Notable for the following components:      Result Value   WBC 11.8 (*)    All other components within normal limits  BASIC METABOLIC PANEL WITH GFR - Abnormal; Notable for the following components:   BUN 26 (*)    Creatinine, Ser 1.35 (*)    GFR, Estimated 52 (*)    All other components within normal limits  TROPONIN T, HIGH SENSITIVITY - Abnormal; Notable for the following components:   Troponin T High Sensitivity 27 (*)    All other components within normal limits  RESP PANEL BY RT-PCR (RSV, FLU A&B, COVID)  RVPGX2  PRO BRAIN NATRIURETIC PEPTIDE    EKG: None  Radiology: No results found.   Procedures   Medications Ordered in the ED  ipratropium-albuterol  (DUONEB) 0.5-2.5 (3) MG/3ML nebulizer solution (3 mLs  Given by Other 10/22/24 1347)  albuterol  (PROVENTIL ) (2.5 MG/3ML) 0.083% nebulizer solution (  Not Given 10/22/24 1402)  methylPREDNISolone  sodium succinate (SOLU-MEDROL ) 125 mg/2 mL injection 125 mg (125 mg Intravenous Given 10/22/24 1507)  ipratropium-albuterol  (DUONEB) 0.5-2.5 (3) MG/3ML nebulizer solution 3 mL (3 mLs Nebulization Given 10/22/24 1506)    Clinical  Course as of 10/22/24 1510  Mon Oct 22, 2024  1342 Echo 3/24 :1. Left ventricular ejection fraction, by estimation, is 45 to 50%. The  left ventricle has mildly decreased function. The left ventricle  demonstrates global hypokinesis. The left ventricular internal cavity size  was mildly dilated. There is mild  eccentric left ventricular hypertrophy. Left ventricular diastolic  parameters are indeterminate.  [TY]  1447 Troponin T High Sensitivity(!): 27 Mild elevation; not having chest pain [TY]  1447 Pro Brain Natriuretic Peptide: 168.0 Not c/w acute decompensated  HF.  [TY]  1451 Patient's wheezing improved, but still present.  Oxygen saturation borderline.  Chest x-ray on my independent review with no overt pneumonia and appears similar to prior.  His BNP not suggestive of heart failure.  Will give steroids and albuterol  and reassess. [TY]  1455 Basic metabolic panel(!) Mild elevation in creatinine [TY]  1509 Care signed out to afternoon team.  Dispo pending reevaluation after nebs. [TY]    Clinical Course User Index [TY] Neysa Caron PARAS, DO                                 Medical Decision Making 83 year old male presenting emergency department for shortness of breath, cough and wheezing.  Per chart review history of hypertension, hyperlipidemia, CAD, CHF.  Does use albuterol , but little improvement in his symptoms.  He is afebrile, nontachycardic, slightly tachypneic; diffuse wheezing.  He does not seem to carry a diagnosis of asthma, although does have inhalers.  Had an echo in March that showed mild depressed EF.  Does sound as though he has had progression of a viral URI.  Pneumonia?.  Will get screening cardiopulmonary labs.  Will trial DuoNeb. See ED course for further MDM.   Amount and/or Complexity of Data Reviewed Independent Historian:     Details: Daughter at bedside notes worsening cough and symptoms for the past several days External Data Reviewed:     Details: See ED  course Labs: ordered. Decision-making details documented in ED Course. Radiology: ordered and independent interpretation performed. Decision-making details documented in ED Course. ECG/medicine tests: ordered and independent interpretation performed. Decision-making details documented in ED Course.    Details: EKG appears to be sinus rhythm at 77 bpm, PVC noted.  No ischemic changes.  Risk Prescription drug management. Decision regarding hospitalization. Diagnosis or treatment significantly limited by social determinants of health.       Final diagnoses:  None    ED Discharge Orders     None          Neysa Caron PARAS, DO 10/22/24 1510

## 2024-10-22 NOTE — Discharge Instructions (Signed)
 Use your albuterol  inhaler or use your nebulizer machine every 6 hours for albuterol  treatment.  Take the prednisone  as directed.  You can start the prednisone  tomorrow.  Return for any new or worse symptoms.  Most likely symptoms related to reactive airway disease from an upper respiratory infection.  But no acute findings.  For the cough could consider Delsym  12-hour available over-the-counter to help suppress that as well.  But the prednisone  and the inhalers will help suppress the cough.

## 2024-10-23 ENCOUNTER — Encounter (HOSPITAL_COMMUNITY): Admission: RE | Admit: 2024-10-23 | Source: Ambulatory Visit

## 2024-11-02 NOTE — Progress Notes (Signed)
 Anesthesia Chart Review   Case: [id number] Date/Time: 11/05/24 0700   Procedure: ARTHROPLASTY, KNEE, TOTAL (Right: Knee)   Anesthesia type: Choice   Pre-op diagnosis: Right knee osteoarthritits   Location: [facility] ROOM 09 / [facility]   Surgeons: Melodi Lerner, MD       DISCUSSION:83 y.o. never smoker with h/o HTN, sleep apnea, GERD, CAD s/p DES to LAD 2022, mild aortic valve stenosis, CHF, right knee OA scheduled for above procedure 11/05/2024 with Dr. Lerner Melodi.   Per cardiology preoperative evaluation 10/09/2024, Chart reviewed as part of pre-operative protocol coverage. According to the RCRI, patient has a 6.6% risk of MACE. Patient reports activity equivalent to >4.0 METS (stationary bike for 20 minutes and weight machines 3 times a week).    Given past medical history and time since last visit, based on ACC/AHA guidelines, Hervey Wedig would be at acceptable risk for the planned procedure without further cardiovascular testing.  Pt with URI that started around 10/12/2024.  Seen by PCP 10/17/2024. Prescribed tessalon  and albuterol  inhaler.  When seen in PAT clinic patient with wheezing. Seen in ED. Chest xray normal. Improvement with nebulizer, discharged on course of prednisone . Pt reports significant improvement with prednisone .  Evaluate DOS.   VS: BP (!) 151/77   Pulse 90   Temp 37 C (Oral)   Resp (!) 22   Ht 5' 4.5 (1.638 m)   Wt 78.9 kg   SpO2 93%   BMI 29.41 kg/m   PROVIDERS: Thedora Garnette HERO, MD is PCP   Cardiologist:  Candyce Reek, MD  LABS: Labs reviewed: Acceptable for surgery. (all labs ordered are listed, but only abnormal results are displayed)  Labs Reviewed  BASIC METABOLIC PANEL WITH GFR - Abnormal; Notable for the following components:      Result Value   Glucose, Bld 106 (*)    BUN 29 (*)    GFR, Estimated 59 (*)    All other components within normal limits  CBC - Abnormal; Notable for the following components:   WBC 11.0 (*)    All other  components within normal limits  SURGICAL PCR SCREEN     IMAGES:   EKG:   CV: Echo 02/20/24  1. Left ventricular ejection fraction, by estimation, is 45 to 50%. The  left ventricle has mildly decreased function. The left ventricle  demonstrates global hypokinesis. The left ventricular internal cavity size  was mildly dilated. There is mild  eccentric left ventricular hypertrophy. Left ventricular diastolic  parameters are indeterminate.   2. Right ventricular systolic function is normal. The right ventricular  size is normal. There is normal pulmonary artery systolic pressure.   3. Left atrial size was moderately dilated.   4. Right atrial size was mildly dilated.   5. The mitral valve is normal in structure. Mild mitral valve  regurgitation. No evidence of mitral stenosis.   6. The aortic valve is tricuspid. There is mild calcification of the  aortic valve. Aortic valve regurgitation is mild. Mild aortic valve  stenosis. Aortic valve mean gradient measures 8.0 mmHg.   7. The inferior vena cava is normal in size with greater than 50%  respiratory variability, suggesting right atrial pressure of 3 mmHg.  Past Medical History:  Diagnosis Date   ALLERGIC RHINITIS 07/13/2007   Aorta dilated on Echo; Normal sized Aorta on CT    Chest CT 1/23: No thoracic aortic aneurysm; coronary calcifications; aortic atherosclerosis; improved aeration of lungs with persistent minimal reticular opacities   Arthritis  Blood transfusion without reported diagnosis    CAD (coronary artery disease)    CHF (congestive heart failure) (HCC)    Clotting disorder    Diverticulitis    GERD (gastroesophageal reflux disease)    GI bleed    Glaucoma    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)    History of hiatal hernia    HYPERLIPIDEMIA 07/13/2007   HYPERTENSION 07/13/2007   Impaired glucose tolerance 09/27/2016   Iron deficiency anemia due to chronic blood loss 02/27/2021    NEPHROLITHIASIS, 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   NSVT (nonsustained ventricular tachycardia) (HCC)    PEPTIC ULCER DISEASE 07/13/2007   PVC's (premature ventricular contractions)    Sleep apnea    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    Past Surgical History:  Procedure Laterality Date   BROW LIFT Bilateral 11/10/2020   Procedure: BLEPHAROPLASTY;  Surgeon: Elisabeth Craig RAMAN, MD;  Location: [facility];  Service: Plastics;  Laterality: Bilateral;  1 hour   COLONOSCOPY N/A 04/12/2022   Procedure: COLONOSCOPY;  Surgeon: Burnette Fallow, MD;  Location: [facility];  Service: Gastroenterology;  Laterality: N/A;   COLONOSCOPY WITH PROPOFOL  N/A 02/17/2019   Procedure: COLONOSCOPY WITH PROPOFOL ;  Surgeon: Saintclair Jasper, MD;  Location: [facility];  Service: Gastroenterology;  Laterality: N/A;   CORONARY STENT INTERVENTION N/A 08/25/2021   Procedure: CORONARY STENT INTERVENTION;  Surgeon: Dann Candyce RAMAN, MD;  Location: [facility] INVASIVE CV LAB;  Service: Cardiovascular;  Laterality: N/A;   CORONARY ULTRASOUND/IVUS N/A 08/25/2021   Procedure: Intravascular Ultrasound/IVUS;  Surgeon: Dann Candyce RAMAN, MD;  Location: [facility] INVASIVE CV LAB;  Service: Cardiovascular;  Laterality: N/A;   EYE SURGERY     HYDROCELE EXCISION Right 05/15/2003   IR ANGIOGRAM VISCERAL SELECTIVE  02/16/2019   IR ANGIOGRAM VISCERAL SELECTIVE  02/16/2019   IR ANGIOGRAM VISCERAL SELECTIVE  02/16/2019   IR US  GUIDE VASC ACCESS RIGHT  02/16/2019   JOINT REPLACEMENT     LEFT HEART CATH AND CORONARY ANGIOGRAPHY N/A 07/28/2021   Procedure: LEFT HEART CATH AND CORONARY ANGIOGRAPHY;  Surgeon: Dann Candyce RAMAN, MD;  Location: [facility] INVASIVE CV LAB;  Service: Cardiovascular;  Laterality: N/A;   LEFT HEART CATH AND CORONARY ANGIOGRAPHY N/A 08/25/2021   Procedure: LEFT HEART CATH AND CORONARY ANGIOGRAPHY;  Surgeon: Dann Candyce RAMAN, MD;  Location: [facility] INVASIVE CV LAB;  Service: Cardiovascular;  Laterality: N/A;   PERIPHERAL  INTRAVASCULAR LITHOTRIPSY  08/25/2021   Procedure: INTRAVASCULAR LITHOTRIPSY;  Surgeon: Dann Candyce RAMAN, MD;  Location: [facility] INVASIVE CV LAB;  Service: Cardiovascular;;   ROTATOR CUFF REPAIR     SPERMATOCELECTOMY Right 05/15/2003   TOTAL KNEE ARTHROPLASTY Left 07/11/2023   Procedure: TOTAL KNEE ARTHROPLASTY;  Surgeon: Melodi Lerner, MD;  Location: [facility];  Service: Orthopedics;  Laterality: Left;    MEDICATIONS:  albuterol  (PROVENTIL ) (5 MG/ML) 0.5% nebulizer solution   albuterol  (VENTOLIN  HFA) 108 (90 Base) MCG/ACT inhaler   Ascorbic Acid (VITAMIN C) 1000 MG tablet   benzonatate  (TESSALON ) 100 MG capsule   cetirizine  (ZYRTEC ) 10 MG tablet   clotrimazole -betamethasone  (LOTRISONE ) cream   fluticasone  (FLONASE ) 50 MCG/ACT nasal spray   hydrochlorothiazide  (HYDRODIURIL ) 25 MG tablet   HYDROcodone  bit-homatropine (HYCODAN) 5-1.5 MG/5ML syrup   hydrOXYzine (ATARAX) 10 MG tablet   latanoprost  (XALATAN ) 0.005 % ophthalmic solution   Magnesium  400 MG TABS   nitroGLYCERIN  (NITROSTAT ) 0.4 MG SL tablet   pantoprazole  (PROTONIX ) 40 MG tablet   Polyvinyl Alcohol -Povidone (REFRESH OP)   predniSONE  (DELTASONE ) 10 MG tablet   psyllium (  METAMUCIL) 58.6 % powder   rosuvastatin  (CRESTOR ) 20 MG tablet   tadalafil  (CIALIS ) 10 MG tablet   tamsulosin  (FLOMAX ) 0.4 MG CAPS capsule   triamcinolone  cream (KENALOG ) 0.1 %   valsartan  (DIOVAN ) 320 MG tablet   zinc gluconate 50 MG tablet   No current facility-administered medications for this encounter.      David Hoots Ward, PA-C [facility] [phone]

## 2024-11-02 NOTE — Anesthesia Preprocedure Evaluation (Addendum)
 Anesthesia Evaluation  Patient identified by MRN, date of birth, ID band Patient awake    Reviewed: Allergy  & Precautions, NPO status , Patient's Chart, lab work & pertinent test results  History of Anesthesia Complications Negative for: history of anesthetic complications  Airway Mallampati: III  TM Distance: >3 FB Neck ROM: Full    Dental  (+) Dental Advisory Given, Edentulous Upper, Teeth Intact   Pulmonary sleep apnea , Recent URI  (Treated 10/22/24, no residual symptoms), Resolved   breath sounds clear to auscultation- rhonchi (-) wheezing      Cardiovascular hypertension, Pt. on medications + CAD, + Cardiac Stents and +CHF  + dysrhythmias Supra Ventricular Tachycardia  Rhythm:Regular Rate:Normal  TTE (01/2024): 1. Left ventricular ejection fraction, by estimation, is 45 to 50%. The  left ventricle has mildly decreased function. The left ventricle  demonstrates global hypokinesis. The left ventricular internal cavity size  was mildly dilated. There is mild  eccentric left ventricular hypertrophy. Left ventricular diastolic  parameters are indeterminate.   2. Right ventricular systolic function is normal. The right ventricular  size is normal. There is normal pulmonary artery systolic pressure.   3. Left atrial size was moderately dilated.   4. Right atrial size was mildly dilated.   5. The mitral valve is normal in structure. Mild mitral valve  regurgitation. No evidence of mitral stenosis.   6. The aortic valve is tricuspid. There is mild calcification of the  aortic valve. Aortic valve regurgitation is mild. Mild aortic valve  stenosis. Aortic valve mean gradient measures 8.0 mmHg.   7. The inferior vena cava is normal in size with greater than 50%  respiratory variability, suggesting right atrial pressure of 3 mmHg     Neuro/Psych    GI/Hepatic hiatal hernia, PUD,GERD  Controlled and Medicated,,  Endo/Other  neg  diabetes    Renal/GU      Musculoskeletal  (+) Arthritis ,    Abdominal   Peds  Hematology  (+) Blood dyscrasia, anemia Hgb 14.1, Plts 261K (10/22/24)   Anesthesia Other Findings   Reproductive/Obstetrics                              Anesthesia Physical Anesthesia Plan  ASA: 2  Anesthesia Plan: MAC and Spinal   Post-op Pain Management: Regional block*   Induction: Intravenous  PONV Risk Score and Plan: 1 and Propofol  infusion and Treatment may vary due to age or medical condition  Airway Management Planned: Natural Airway and Simple Face Mask  Additional Equipment: None  Intra-op Plan:   Post-operative Plan:   Informed Consent:      Dental advisory given  Plan Discussed with: CRNA  Anesthesia Plan Comments: (See PAT note 10/22/24)         Anesthesia Quick Evaluation

## 2024-11-05 ENCOUNTER — Encounter (HOSPITAL_COMMUNITY): Payer: Self-pay | Admitting: Orthopedic Surgery

## 2024-11-05 ENCOUNTER — Other Ambulatory Visit: Payer: Self-pay

## 2024-11-05 ENCOUNTER — Observation Stay (HOSPITAL_COMMUNITY)
Admission: RE | Admit: 2024-11-05 | Discharge: 2024-11-06 | Disposition: A | Source: Ambulatory Visit | Attending: Orthopedic Surgery | Admitting: Orthopedic Surgery

## 2024-11-05 ENCOUNTER — Ambulatory Visit (HOSPITAL_COMMUNITY): Payer: Self-pay | Admitting: Physician Assistant

## 2024-11-05 ENCOUNTER — Encounter (HOSPITAL_COMMUNITY): Admission: RE | Disposition: A | Payer: Self-pay | Source: Ambulatory Visit | Attending: Orthopedic Surgery

## 2024-11-05 DIAGNOSIS — M179 Osteoarthritis of knee, unspecified: Principal | ICD-10-CM | POA: Diagnosis present

## 2024-11-05 DIAGNOSIS — M1711 Unilateral primary osteoarthritis, right knee: Principal | ICD-10-CM | POA: Diagnosis present

## 2024-11-05 HISTORY — PX: TOTAL KNEE ARTHROPLASTY: SHX125

## 2024-11-05 SURGERY — ARTHROPLASTY, KNEE, TOTAL
Anesthesia: Monitor Anesthesia Care | Site: Knee | Laterality: Right

## 2024-11-05 MED ORDER — ACETAMINOPHEN 325 MG PO TABS: 325.0000 mg | ORAL_TABLET | Freq: Four times a day (QID) | ORAL | Status: DC | PRN

## 2024-11-05 MED ORDER — TRAMADOL HCL 50 MG PO TABS
50.0000 mg | ORAL_TABLET | Freq: Four times a day (QID) | ORAL | Status: DC | PRN
  Administered 2024-11-05 (×2): 100 mg via ORAL
  Filled 2024-11-05 (×2): qty 2

## 2024-11-05 MED ORDER — ASPIRIN 81 MG PO CHEW
81.0000 mg | CHEWABLE_TABLET | Freq: Two times a day (BID) | ORAL | Status: DC
  Administered 2024-11-06: 81 mg via ORAL
  Filled 2024-11-05: qty 1

## 2024-11-05 MED ORDER — PANTOPRAZOLE SODIUM 40 MG PO TBEC
40.0000 mg | DELAYED_RELEASE_TABLET | Freq: Every day | ORAL | Status: DC
  Administered 2024-11-06: 40 mg via ORAL
  Filled 2024-11-05: qty 1

## 2024-11-05 MED ORDER — METHOCARBAMOL 1000 MG/10ML IJ SOLN: 500.0000 mg | Freq: Four times a day (QID) | INTRAMUSCULAR | Status: DC | PRN

## 2024-11-05 MED ORDER — POVIDONE-IODINE 10 % EX SWAB
2.0000 | Freq: Once | CUTANEOUS | Status: AC
  Administered 2024-11-05: 2 via TOPICAL

## 2024-11-05 MED ORDER — ONDANSETRON HCL 4 MG/2ML IJ SOLN
INTRAMUSCULAR | Status: DC | PRN
  Administered 2024-11-05: 4 mg via INTRAVENOUS

## 2024-11-05 MED ORDER — BISACODYL 10 MG RE SUPP: 10.0000 mg | Freq: Every day | RECTAL | Status: DC | PRN

## 2024-11-05 MED ORDER — PHENYLEPHRINE HCL (PRESSORS) 10 MG/ML IV SOLN
INTRAVENOUS | Status: DC | PRN
  Administered 2024-11-05 (×2): 80 ug via INTRAVENOUS

## 2024-11-05 MED ORDER — SODIUM CHLORIDE (PF) 0.9 % IJ SOLN
INTRAMUSCULAR | Status: AC
  Filled 2024-11-05: qty 50

## 2024-11-05 MED ORDER — FENTANYL CITRATE (PF) 50 MCG/ML IJ SOSY: 25.0000 ug | PREFILLED_SYRINGE | INTRAMUSCULAR | Status: DC | PRN

## 2024-11-05 MED ORDER — BUPIVACAINE LIPOSOME 1.3 % IJ SUSP: 20.0000 mL | Freq: Once | INTRAMUSCULAR | Status: AC

## 2024-11-05 MED ORDER — FLEET ENEMA RE ENEM: 1.0000 | ENEMA | Freq: Once | RECTAL | Status: DC | PRN

## 2024-11-05 MED ORDER — HYDROCHLOROTHIAZIDE 25 MG PO TABS
25.0000 mg | ORAL_TABLET | Freq: Every day | ORAL | Status: DC
  Administered 2024-11-06: 25 mg via ORAL
  Filled 2024-11-05: qty 1

## 2024-11-05 MED ORDER — OXYCODONE HCL 5 MG PO TABS
5.0000 mg | ORAL_TABLET | ORAL | Status: DC | PRN
  Administered 2024-11-05: 5 mg via ORAL
  Administered 2024-11-05: 10 mg via ORAL
  Administered 2024-11-06: 5 mg via ORAL
  Administered 2024-11-06: 10 mg via ORAL
  Filled 2024-11-05 (×4): qty 2

## 2024-11-05 MED ORDER — ONDANSETRON HCL 4 MG/2ML IJ SOLN: 4.0000 mg | Freq: Four times a day (QID) | INTRAMUSCULAR | Status: DC | PRN

## 2024-11-05 MED ORDER — PHENYLEPHRINE HCL-NACL 20-0.9 MG/250ML-% IV SOLN
INTRAVENOUS | Status: DC | PRN
  Administered 2024-11-05: 25 ug/min via INTRAVENOUS

## 2024-11-05 MED ORDER — SODIUM CHLORIDE 0.9 % IV SOLN
INTRAVENOUS | Status: DC | PRN
  Administered 2024-11-05: 80 mL

## 2024-11-05 MED ORDER — CEFAZOLIN SODIUM-DEXTROSE 2-4 GM/100ML-% IV SOLN
2.0000 g | INTRAVENOUS | Status: AC
  Administered 2024-11-05: 2 g via INTRAVENOUS
  Filled 2024-11-05: qty 100

## 2024-11-05 MED ORDER — AMISULPRIDE (ANTIEMETIC) 5 MG/2ML IV SOLN: 10.0000 mg | Freq: Once | INTRAVENOUS | Status: DC | PRN

## 2024-11-05 MED ORDER — DOCUSATE SODIUM 100 MG PO CAPS
100.0000 mg | ORAL_CAPSULE | Freq: Two times a day (BID) | ORAL | Status: DC
  Administered 2024-11-05 – 2024-11-06 (×2): 100 mg via ORAL
  Filled 2024-11-05 (×2): qty 1

## 2024-11-05 MED ORDER — FENTANYL CITRATE (PF) 100 MCG/2ML IJ SOLN
INTRAMUSCULAR | Status: AC
  Filled 2024-11-05: qty 2

## 2024-11-05 MED ORDER — LACTATED RINGERS IV SOLN: INTRAVENOUS | Status: DC

## 2024-11-05 MED ORDER — MENTHOL 3 MG MT LOZG
1.0000 | LOZENGE | OROMUCOSAL | Status: DC | PRN
  Filled 2024-11-05: qty 9

## 2024-11-05 MED ORDER — FENTANYL CITRATE (PF) 250 MCG/5ML IJ SOLN
INTRAMUSCULAR | Status: DC | PRN
  Administered 2024-11-05 (×2): 50 ug via INTRAVENOUS

## 2024-11-05 MED ORDER — 0.9 % SODIUM CHLORIDE (POUR BTL) OPTIME
TOPICAL | Status: DC | PRN
  Administered 2024-11-05: 1000 mL

## 2024-11-05 MED ORDER — IRBESARTAN 150 MG PO TABS
300.0000 mg | ORAL_TABLET | Freq: Every day | ORAL | Status: DC
  Administered 2024-11-06: 300 mg via ORAL
  Filled 2024-11-05: qty 2

## 2024-11-05 MED ORDER — ACETAMINOPHEN 10 MG/ML IV SOLN: 1000.0000 mg | Freq: Once | INTRAVENOUS | Status: DC | PRN

## 2024-11-05 MED ORDER — POLYETHYLENE GLYCOL 3350 17 G PO PACK: 17.0000 g | PACK | Freq: Every day | ORAL | Status: DC | PRN

## 2024-11-05 MED ORDER — SODIUM CHLORIDE 0.9 % IV SOLN: INTRAVENOUS | Status: DC

## 2024-11-05 MED ORDER — OXYCODONE HCL 5 MG/5ML PO SOLN: 5.0000 mg | Freq: Once | ORAL | Status: DC | PRN

## 2024-11-05 MED ORDER — NITROGLYCERIN 0.4 MG SL SUBL: 0.4000 mg | SUBLINGUAL_TABLET | SUBLINGUAL | Status: DC | PRN

## 2024-11-05 MED ORDER — METOCLOPRAMIDE HCL 5 MG PO TABS: 5.0000 mg | ORAL_TABLET | Freq: Three times a day (TID) | ORAL | Status: DC | PRN

## 2024-11-05 MED ORDER — METOCLOPRAMIDE HCL 5 MG/ML IJ SOLN: 5.0000 mg | Freq: Three times a day (TID) | INTRAMUSCULAR | Status: DC | PRN

## 2024-11-05 MED ORDER — PROPOFOL 500 MG/50ML IV EMUL
INTRAVENOUS | Status: DC | PRN
  Administered 2024-11-05: 100 ug/kg/min via INTRAVENOUS

## 2024-11-05 MED ORDER — BUPIVACAINE LIPOSOME 1.3 % IJ SUSP
INTRAMUSCULAR | Status: AC
  Filled 2024-11-05: qty 20

## 2024-11-05 MED ORDER — ORAL CARE MOUTH RINSE: 15.0000 mL | Freq: Once | OROMUCOSAL | Status: AC

## 2024-11-05 MED ORDER — ALBUTEROL SULFATE (2.5 MG/3ML) 0.083% IN NEBU: 2.5000 mg | INHALATION_SOLUTION | RESPIRATORY_TRACT | Status: DC | PRN

## 2024-11-05 MED ORDER — METHOCARBAMOL 500 MG PO TABS
500.0000 mg | ORAL_TABLET | Freq: Four times a day (QID) | ORAL | Status: DC | PRN
  Administered 2024-11-05 – 2024-11-06 (×3): 500 mg via ORAL
  Filled 2024-11-05 (×3): qty 1

## 2024-11-05 MED ORDER — SODIUM CHLORIDE (PF) 0.9 % IJ SOLN
INTRAMUSCULAR | Status: AC
  Filled 2024-11-05: qty 10

## 2024-11-05 MED ORDER — TAMSULOSIN HCL 0.4 MG PO CAPS
0.4000 mg | ORAL_CAPSULE | Freq: Every day | ORAL | Status: DC
  Administered 2024-11-06: 0.4 mg via ORAL
  Filled 2024-11-05: qty 1

## 2024-11-05 MED ORDER — PHENOL 1.4 % MT LIQD: 1.0000 | OROMUCOSAL | Status: DC | PRN

## 2024-11-05 MED ORDER — ROSUVASTATIN CALCIUM 20 MG PO TABS
20.0000 mg | ORAL_TABLET | Freq: Every day | ORAL | Status: DC
  Administered 2024-11-06: 20 mg via ORAL
  Filled 2024-11-05: qty 1

## 2024-11-05 MED ORDER — LIDOCAINE 2% (20 MG/ML) 5 ML SYRINGE
INTRAMUSCULAR | Status: DC | PRN
  Administered 2024-11-05: 100 mg via INTRAVENOUS

## 2024-11-05 MED ORDER — ACETAMINOPHEN 500 MG PO TABS
1000.0000 mg | ORAL_TABLET | Freq: Four times a day (QID) | ORAL | Status: DC
  Administered 2024-11-05 – 2024-11-06 (×3): 1000 mg via ORAL
  Filled 2024-11-05 (×3): qty 2

## 2024-11-05 MED ORDER — OXYCODONE HCL 5 MG PO TABS: 5.0000 mg | ORAL_TABLET | Freq: Once | ORAL | Status: DC | PRN

## 2024-11-05 MED ORDER — TRANEXAMIC ACID-NACL 1000-0.7 MG/100ML-% IV SOLN
1000.0000 mg | INTRAVENOUS | Status: AC
  Administered 2024-11-05: 1000 mg via INTRAVENOUS
  Filled 2024-11-05: qty 100

## 2024-11-05 MED ORDER — PROPOFOL 10 MG/ML IV BOLUS
INTRAVENOUS | Status: AC
  Filled 2024-11-05: qty 20

## 2024-11-05 MED ORDER — ORAL CARE MOUTH RINSE: 15.0000 mL | OROMUCOSAL | Status: DC | PRN

## 2024-11-05 MED ORDER — SODIUM CHLORIDE 0.9 % IR SOLN
Status: DC | PRN
  Administered 2024-11-05: 1000 mL

## 2024-11-05 MED ORDER — CHLORHEXIDINE GLUCONATE 0.12 % MT SOLN
15.0000 mL | Freq: Once | OROMUCOSAL | Status: AC
  Administered 2024-11-05: 15 mL via OROMUCOSAL

## 2024-11-05 MED ORDER — ROPIVACAINE HCL 5 MG/ML IJ SOLN
INTRAMUSCULAR | Status: DC | PRN
  Administered 2024-11-05: 20 mL via PERINEURAL

## 2024-11-05 MED ORDER — ONDANSETRON HCL 4 MG/2ML IJ SOLN: 4.0000 mg | Freq: Once | INTRAMUSCULAR | Status: DC | PRN

## 2024-11-05 MED ORDER — ONDANSETRON HCL 4 MG PO TABS: 4.0000 mg | ORAL_TABLET | Freq: Four times a day (QID) | ORAL | Status: DC | PRN

## 2024-11-05 MED ORDER — DIPHENHYDRAMINE HCL 12.5 MG/5ML PO ELIX: 12.5000 mg | ORAL_SOLUTION | ORAL | Status: DC | PRN

## 2024-11-05 MED ORDER — CEFAZOLIN SODIUM-DEXTROSE 2-4 GM/100ML-% IV SOLN
2.0000 g | Freq: Four times a day (QID) | INTRAVENOUS | Status: AC
  Administered 2024-11-05 (×2): 2 g via INTRAVENOUS
  Filled 2024-11-05 (×2): qty 100

## 2024-11-05 MED ORDER — ACETAMINOPHEN 10 MG/ML IV SOLN
1000.0000 mg | Freq: Four times a day (QID) | INTRAVENOUS | Status: DC
  Administered 2024-11-05: 1000 mg via INTRAVENOUS
  Filled 2024-11-05: qty 100

## 2024-11-05 MED ORDER — DEXAMETHASONE SOD PHOSPHATE PF 10 MG/ML IJ SOLN
10.0000 mg | Freq: Once | INTRAMUSCULAR | Status: AC
  Administered 2024-11-06: 10 mg via INTRAVENOUS

## 2024-11-05 MED ORDER — DEXAMETHASONE SOD PHOSPHATE PF 10 MG/ML IJ SOLN: 8.0000 mg | Freq: Once | INTRAMUSCULAR | Status: AC

## 2024-11-05 MED ORDER — HYDROMORPHONE HCL 1 MG/ML IJ SOLN
0.5000 mg | INTRAMUSCULAR | Status: DC | PRN
  Administered 2024-11-06: 1 mg via INTRAVENOUS
  Filled 2024-11-05: qty 1

## 2024-11-05 SURGICAL SUPPLY — 42 items
ATTUNE MED DOME PAT 38 KNEE (Knees) IMPLANT
ATTUNE PS FEM RT SZ 6 CEM KNEE (Femur) IMPLANT
ATTUNE PSRP INSR SZ6 8 KNEE (Insert) IMPLANT
BAG COUNTER SPONGE SURGICOUNT (BAG) IMPLANT
BAG ZIPLOCK 12X15 (MISCELLANEOUS) ×1 IMPLANT
BASE TIBIA ATTUNE KNEE SYS SZ6 (Knees) IMPLANT
BLADE SAG 18X100X1.27 (BLADE) ×1 IMPLANT
BLADE SAW SGTL 11.0X1.19X90.0M (BLADE) ×1 IMPLANT
BNDG ELASTIC 6INX 5YD STR LF (GAUZE/BANDAGES/DRESSINGS) ×1 IMPLANT
BNDG ELASTIC 6X10 VLCR STRL LF (GAUZE/BANDAGES/DRESSINGS) IMPLANT
BOWL SMART MIX CTS (DISPOSABLE) ×1 IMPLANT
CEMENT HV SMART SET (Cement) ×2 IMPLANT
COVER SURGICAL LIGHT HANDLE (MISCELLANEOUS) ×1 IMPLANT
CUFF TRNQT CYL 34X4.125X (TOURNIQUET CUFF) ×1 IMPLANT
DERMABOND ADVANCED .7 DNX12 (GAUZE/BANDAGES/DRESSINGS) ×1 IMPLANT
DRAPE U-SHAPE 47X51 STRL (DRAPES) ×2 IMPLANT
DRSG AQUACEL AG ADV 3.5X10 (GAUZE/BANDAGES/DRESSINGS) ×1 IMPLANT
DURAPREP 26ML APPLICATOR (WOUND CARE) ×2 IMPLANT
ELECT REM PT RETURN 15FT ADLT (MISCELLANEOUS) ×1 IMPLANT
GLOVE BIO SURGEON STRL SZ 6.5 (GLOVE) IMPLANT
GLOVE BIO SURGEON STRL SZ8 (GLOVE) ×1 IMPLANT
GLOVE BIOGEL PI IND STRL 7.0 (GLOVE) ×2 IMPLANT
GLOVE BIOGEL PI IND STRL 8 (GLOVE) ×2 IMPLANT
GOWN STRL REUS W/ TWL LRG LVL3 (GOWN DISPOSABLE) ×2 IMPLANT
HOLDER FOLEY CATH W/STRAP (MISCELLANEOUS) ×1 IMPLANT
IMMOBILIZER KNEE 20 THIGH 36 (SOFTGOODS) ×1 IMPLANT
KIT TURNOVER KIT A (KITS) ×1 IMPLANT
MANIFOLD NEPTUNE II (INSTRUMENTS) ×1 IMPLANT
NS IRRIG 1000ML POUR BTL (IV SOLUTION) ×1 IMPLANT
PACK TOTAL KNEE CUSTOM (KITS) ×1 IMPLANT
PADDING CAST COTTON 6X4 STRL (CAST SUPPLIES) ×2 IMPLANT
PENCIL SMOKE EVACUATOR (MISCELLANEOUS) ×2 IMPLANT
PIN STEINMAN FIXATION KNEE (PIN) IMPLANT
PROTECTOR NERVE ULNAR (MISCELLANEOUS) ×1 IMPLANT
SET HNDPC FAN SPRY TIP SCT (DISPOSABLE) ×1 IMPLANT
SUT MNCRL AB 4-0 PS2 18 (SUTURE) ×1 IMPLANT
SUT VIC AB 2-0 CT1 TAPERPNT 27 (SUTURE) ×3 IMPLANT
SUTURE STRATFX 0 PDS 27 VIOLET (SUTURE) ×1 IMPLANT
TOWEL GREEN STERILE FF (TOWEL DISPOSABLE) ×1 IMPLANT
TRAY FOLEY MTR SLVR 16FR STAT (SET/KITS/TRAYS/PACK) ×1 IMPLANT
TUBE SUCTION HIGH CAP CLEAR NV (SUCTIONS) ×1 IMPLANT
WRAP KNEE MAXI GEL POST OP (GAUZE/BANDAGES/DRESSINGS) ×1 IMPLANT

## 2024-11-05 NOTE — Anesthesia Postprocedure Evaluation (Signed)
 Anesthesia Post Note  Patient: David Goodman  Procedure(s) Performed: ARTHROPLASTY, KNEE, TOTAL (Right: Knee)     Patient location during evaluation: PACU Anesthesia Type: MAC and Spinal Level of consciousness: awake Pain management: pain level controlled Vital Signs Assessment: post-procedure vital signs reviewed and stable Respiratory status: spontaneous breathing Cardiovascular status: blood pressure returned to baseline Postop Assessment: no apparent nausea or vomiting and spinal receding Anesthetic complications: no   No notable events documented.  Last Vitals:  Vitals:   11/05/24 1017 11/05/24 1024  BP: (!) 151/72   Pulse: 60   Resp: 15   Temp:  36.6 C  SpO2:  99%    Last Pain:  Vitals:   11/05/24 1124  TempSrc:   PainSc: 7                  Judeth Gilles T Colhoun

## 2024-11-05 NOTE — Progress Notes (Signed)
 Orthopedic Tech Progress Note Patient Details:  David Goodman [birth year] [id number] Applied CPM per order. Will remove at 12:55 pm.  CPM Right Knee CPM Right Knee: On Right Knee Flexion (Degrees): 40 Right Knee Extension (Degrees): 10  Post Interventions Patient Tolerated: Well Instructions Provided: Adjustment of device, Care of device, Poper ambulation with device Ortho Devices Type of Ortho Device: CPM padding Ortho Device/Splint Location: RLE Ortho Device/Splint Interventions: Ordered, Application, Adjustment   Post Interventions Patient Tolerated: Well Instructions Provided: Adjustment of device, Care of device, Poper ambulation with device  Morna Pink 11/05/2024, 8:57 AM

## 2024-11-05 NOTE — Interval H&P Note (Signed)
 History and Physical Interval Note:  11/05/2024 6:36 AM  David Goodman  has presented today for surgery, with the diagnosis of Right knee osteoarthritits.  The various methods of treatment have been discussed with the patient and family. After consideration of risks, benefits and other options for treatment, the patient has consented to  Procedure(s): ARTHROPLASTY, KNEE, TOTAL (Right) as a surgical intervention.  The patient's history has been reviewed, patient examined, no change in status, stable for surgery.  I have reviewed the patient's chart and labs.  Questions were answered to the patient's satisfaction.     Dempsey Miami Latulippe

## 2024-11-05 NOTE — Discharge Instructions (Signed)
 Frank Aluisio, MD Total Joint Specialist [facility] [address] [place], [place] [address] [phone]  TOTAL KNEE REPLACEMENT POSTOPERATIVE DIRECTIONS    Knee Rehabilitation, Guidelines Following Surgery  Results after knee surgery are often greatly improved when you follow the exercise, range of motion and muscle strengthening exercises prescribed by your doctor. Safety measures are also important to protect the knee from further injury. If any of these exercises cause you to have increased pain or swelling in your knee joint, decrease the amount until you are comfortable again and slowly increase them. If you have problems or questions, call your caregiver or physical therapist for advice.   BLOOD CLOT PREVENTION Take an 81 mg Aspirin two times a day for three weeks following surgery. Then resume one 81 mg Aspirin once a day. You may resume your vitamins/supplements upon discharge from the hospital. Do not take any NSAIDs (Advil, Aleve, Ibuprofen, Meloxicam, etc.) until you are 3 weeks out from surgery  HOME CARE INSTRUCTIONS  Remove items at home which could result in a fall. This includes throw rugs or furniture in walking pathways.  ICE to the affected knee as much as tolerated. Icing helps control swelling. If the swelling is well controlled you will be more comfortable and rehab easier. Continue to use ice on the knee for pain and swelling from surgery. You may notice swelling that will progress down to the foot and ankle. This is normal after surgery. Elevate the leg when you are not up walking on it.    Continue to use the breathing machine which will help keep your temperature down. It is common for your temperature to cycle up and down following surgery, especially at night when you are not up moving around and exerting yourself. The breathing machine keeps your lungs expanded and your temperature down. Do not place pillow under the operative  knee, focus on keeping the knee straight while resting  DIET You may resume your previous home diet once you are discharged from the hospital.  DRESSING / WOUND CARE / SHOWERING Keep your bulky bandage on for 2 days. On the third post-operative day you may remove the Ace bandage and gauze. There is a waterproof adhesive bandage on your skin which will stay in place until your first follow-up appointment. Once you remove this you will not need to place another bandage You may begin showering 3 days following surgery, but do not submerge the incision under water.  ACTIVITY For the first 5 days, the key is rest and control of pain and swelling Do your home exercises twice a day starting on post-operative day 3. On the days you go to physical therapy, just do the home exercises once that day. You should rest, ice and elevate the leg for 50 minutes out of every hour. Get up and walk/stretch for 10 minutes per hour. After 5 days you can increase your activity slowly as tolerated. Walk with your walker as instructed. Use the walker until you are comfortable transitioning to a cane. Walk with the cane in the opposite hand of the operative leg. You may discontinue the cane once you are comfortable and walking steadily. Avoid periods of inactivity such as sitting longer than an hour when not asleep. This helps prevent blood clots.  You may discontinue the knee immobilizer once you are able to perform a straight leg raise while lying down. You may resume a sexual relationship in one month or when given the OK by   your doctor.  You may return to work once you are cleared by your doctor.  Do not drive a car for 6 weeks or until released by your surgeon.  Do not drive while taking narcotics.  TED HOSE STOCKINGS Wear the elastic stockings on both legs for three weeks following surgery during the day. You may remove them at night for sleeping.  WEIGHT BEARING Weight bearing as tolerated with assist device  (walker, cane, etc) as directed, use it as long as suggested by your surgeon or therapist, typically at least 4-6 weeks.  POSTOPERATIVE CONSTIPATION PROTOCOL Constipation - defined medically as fewer than three stools per week and severe constipation as less than one stool per week.  One of the most common issues patients have following surgery is constipation.  Even if you have a regular bowel pattern at home, your normal regimen is likely to be disrupted due to multiple reasons following surgery.  Combination of anesthesia, postoperative narcotics, change in appetite and fluid intake all can affect your bowels.  In order to avoid complications following surgery, here are some recommendations in order to help you during your recovery period.  Colace (docusate) - Pick up an over-the-counter form of Colace or another stool softener and take twice a day as long as you are requiring postoperative pain medications.  Take with a full glass of water daily.  If you experience loose stools or diarrhea, hold the colace until you stool forms back up. If your symptoms do not get better within 1 week or if they get worse, check with your doctor. Dulcolax (bisacodyl) - Pick up over-the-counter and take as directed by the product packaging as needed to assist with the movement of your bowels.  Take with a full glass of water.  Use this product as needed if not relieved by Colace only.  MiraLax (polyethylene glycol) - Pick up over-the-counter to have on hand. MiraLax is a solution that will increase the amount of water in your bowels to assist with bowel movements.  Take as directed and can mix with a glass of water, juice, soda, coffee, or tea. Take if you go more than two days without a movement. Do not use MiraLax more than once per day. Call your doctor if you are still constipated or irregular after using this medication for 7 days in a row.  If you continue to have problems with postoperative constipation, please  contact the office for further assistance and recommendations.  If you experience "the worst abdominal pain ever" or develop nausea or vomiting, please contact the office immediatly for further recommendations for treatment.  ITCHING If you experience itching with your medications, try taking only a single pain pill, or even half a pain pill at a time.  You can also use Benadryl over the counter for itching or also to help with sleep.   MEDICATIONS See your medication summary on the "After Visit Summary" that the nursing staff will review with you prior to discharge.  You may have some home medications which will be placed on hold until you complete the course of blood thinner medication.  It is important for you to complete the blood thinner medication as prescribed by your surgeon.  Continue your approved medications as instructed at time of discharge.  PRECAUTIONS If you experience chest pain or shortness of breath - call 911 immediately for transfer to the hospital emergency department.  If you develop a fever greater that 101 F, purulent drainage from wound, increased redness   or drainage from wound, foul odor from the wound/dressing, or calf pain - CONTACT YOUR SURGEON.                                                   FOLLOW-UP APPOINTMENTS Make sure you keep all of your appointments after your operation with your surgeon and caregivers. You should call the office at the above phone number and make an appointment for approximately two weeks after the date of your surgery or on the date instructed by your surgeon outlined in the "After Visit Summary".  RANGE OF MOTION AND STRENGTHENING EXERCISES  Rehabilitation of the knee is important following a knee injury or an operation. After just a few days of immobilization, the muscles of the thigh which control the knee become weakened and shrink (atrophy). Knee exercises are designed to build up the tone and strength of the thigh muscles and to  improve knee motion. Often times heat used for twenty to thirty minutes before working out will loosen up your tissues and help with improving the range of motion but do not use heat for the first two weeks following surgery. These exercises can be done on a training (exercise) mat, on the floor, on a table or on a bed. Use what ever works the best and is most comfortable for you Knee exercises include:  Leg Lifts - While your knee is still immobilized in a splint or cast, you can do straight leg raises. Lift the leg to 60 degrees, hold for 3 sec, and slowly lower the leg. Repeat 10-20 times 2-3 times daily. Perform this exercise against resistance later as your knee gets better.  Quad and Hamstring Sets - Tighten up the muscle on the front of the thigh (Quad) and hold for 5-10 sec. Repeat this 10-20 times hourly. Hamstring sets are done by pushing the foot backward against an object and holding for 5-10 sec. Repeat as with quad sets.  Leg Slides: Lying on your back, slowly slide your foot toward your buttocks, bending your knee up off the floor (only go as far as is comfortable). Then slowly slide your foot back down until your leg is flat on the floor again. Angel Wings: Lying on your back spread your legs to the side as far apart as you can without causing discomfort.  A rehabilitation program following serious knee injuries can speed recovery and prevent re-injury in the future due to weakened muscles. Contact your doctor or a physical therapist for more information on knee rehabilitation.   POST-OPERATIVE OPIOID TAPER INSTRUCTIONS: It is important to wean off of your opioid medication as soon as possible. If you do not need pain medication after your surgery it is ok to stop day one. Opioids include: Codeine, Hydrocodone(Norco, Vicodin), Oxycodone(Percocet, oxycontin) and hydromorphone amongst others.  Long term and even short term use of opiods can cause: Increased pain  response Dependence Constipation Depression Respiratory depression And more.  Withdrawal symptoms can include Flu like symptoms Nausea, vomiting And more Techniques to manage these symptoms Hydrate well Eat regular healthy meals Stay active Use relaxation techniques(deep breathing, meditating, yoga) Do Not substitute Alcohol to help with tapering If you have been on opioids for less than two weeks and do not have pain than it is ok to stop all together.  Plan to wean off of opioids This   plan should start within one week post op of your joint replacement. Maintain the same interval or time between taking each dose and first decrease the dose.  Cut the total daily intake of opioids by one tablet each day Next start to increase the time between doses. The last dose that should be eliminated is the evening dose.   IF YOU ARE TRANSFERRED TO A SKILLED REHAB FACILITY If the patient is transferred to a skilled rehab facility following release from the hospital, a list of the current medications will be sent to the facility for the patient to continue.  When discharged from the skilled rehab facility, please have the facility set up the patient's Home Health Physical Therapy prior to being released. Also, the skilled facility will be responsible for providing the patient with their medications at time of release from the facility to include their pain medication, the muscle relaxants, and their blood thinner medication. If the patient is still at the rehab facility at time of the two week follow up appointment, the skilled rehab facility will also need to assist the patient in arranging follow up appointment in our office and any transportation needs.  MAKE SURE YOU:  Understand these instructions.  Get help right away if you are not doing well or get worse.   DENTAL ANTIBIOTICS:  In most cases prophylactic antibiotics for Dental procdeures after total joint surgery are not  necessary.  Exceptions are as follows:  1. History of prior total joint infection  2. Severely immunocompromised (Organ Transplant, cancer chemotherapy, Rheumatoid biologic meds such as Humera)  3. Poorly controlled diabetes (A1C &gt; 8.0, blood glucose over 200)  If you have one of these conditions, contact your surgeon for an antibiotic prescription, prior to your dental procedure.    Pick up stool softner and laxative for home use following surgery while on pain medications. Do not submerge incision under water. Please use good hand washing techniques while changing dressing each day. May shower starting three days after surgery. Please use a clean towel to pat the incision dry following showers. Continue to use ice for pain and swelling after surgery. Do not use any lotions or creams on the incision until instructed by your surgeon.  

## 2024-11-05 NOTE — Anesthesia Procedure Notes (Addendum)
 Anesthesia Regional Block: Adductor canal block   Pre-Anesthetic Checklist: , timeout performed,  Correct Patient, Correct Site, Correct Laterality,  Correct Procedure, Correct Position, site marked,  Risks and benefits discussed,  Surgical consent,  Pre-op evaluation,  At surgeon's request and post-op pain management  Laterality: Lower and Right  Prep: chloraprep       Needles:  Injection technique: Single-shot  Needle Type: Echogenic Stimulator Needle     Needle Length: 10cm  Needle Gauge: 20     Additional Needles:   Procedures:,,,, ultrasound used (permanent image in chart),, #20gu IV placed     Nerve Stimulator or Paresthesia:  Response: quadraceps contraction  Additional Responses:   Narrative:  Start time: 11/05/2024 6:49 AM End time: 11/05/2024 6:49 AM Injection made incrementally with aspirations every 5 mL.  Performed by: Personally  Anesthesiologist: Colhoun, Lauraine DASEN, MD  Additional Notes: Timeout performed with bedside RN - Name, DOB, allergies and laterality confirmed by the patient and RN. Surgical marking performed/confirmed. Anticoagulation status and most recent platelet count reviewed. Patient placed in a frog-leg position and sedation (as documented by RN) given via PIV. Peripheral nerve block performed as documented above. VSS throughout (see Flowchart).

## 2024-11-05 NOTE — Progress Notes (Signed)
 Orthopedic Tech Progress Note Patient Details:  David Goodman 01/31/1941 [id number]  [name], male   DOB: 27-Mar-1941, 83 y.o.   MRN: [id number] Removed pt from CPM Morna Pink 11/05/2024, 1:07 PM

## 2024-11-05 NOTE — Care Plan (Signed)
 Ortho Bundle Case Management Note  Patient Details  Name: David Goodman MRN: [id number] Date of Birth: [birth year]  R TKA on 11/05/24  DCP: Home with wife   DME: No needs  PT: EO                   DME Arranged:  N/A DME Agency:  NA  HH Arranged:    HH Agency:     Additional Comments: Please contact me with any questions of if this plan should need to change.  Lyle Pepper, CCM [facility] [phone] Ext. [phone]   11/05/2024, 10:31 AM

## 2024-11-05 NOTE — Evaluation (Signed)
 Physical Therapy Evaluation Patient Details Name: David Goodman MRN: [id number] DOB: 04-03-1941 Today's Date: 11/05/2024  History of Present Illness  83 yo male s/p R TKA on 11/05/24. PMH: sHF, L TKA 2024, spondylolisthesis L5-S1, gait instability, diverticulitis, HTN, GIB.  Clinical Impression  Pt is s/p TKA resulting in the deficits listed below (see PT Problem List).  Pt amb ~ 54' with RW and min assist, initiated HEP.  anticipate steady progress in acute setting, pt is motivated to d/c home tomorrow   Pt will benefit from acute skilled PT to increase their independence and safety with mobility to allow discharge.          If plan is discharge home, recommend the following: A little help with walking and/or transfers;A little help with bathing/dressing/bathroom;Help with stairs or ramp for entrance;Assist for transportation;Assistance with cooking/housework   Can travel by private vehicle        Equipment Recommendations None recommended by PT  Recommendations for Other Services       Functional Status Assessment Patient has had a recent decline in their functional status and demonstrates the ability to make significant improvements in function in a reasonable and predictable amount of time.     Precautions / Restrictions Precautions Precautions: Fall;Knee Restrictions Weight Bearing Restrictions Per Provider Order: No Other Position/Activity Restrictions: WBAT      Mobility  Bed Mobility Overal bed mobility: Needs Assistance Bed Mobility: Supine to Sit     Supine to sit: Min assist     General bed mobility comments: assist to elevate trunk    Transfers Overall transfer level: Needs assistance Equipment used: Rolling walker (2 wheels) Transfers: Sit to/from Stand Sit to Stand: Min assist           General transfer comment: cues for hand placement and LE Position, assist to rise and steady    Ambulation/Gait Ambulation/Gait assistance: Min assist,  Contact guard assist Gait Distance (Feet): 60 Feet Assistive device: Rolling walker (2 wheels) Gait Pattern/deviations: Step-to pattern Gait velocity: decr     General Gait Details: cues for sequence and RW position  Stairs            Wheelchair Mobility     Tilt Bed    Modified Rankin (Stroke Patients Only)       Balance Overall balance assessment: Mild deficits observed, not formally tested                                           Pertinent Vitals/Pain Pain Assessment Pain Assessment: 0-10 Pain Score: 4  Pain Location: right knee Pain Descriptors / Indicators: Sore, Aching Pain Intervention(s): Limited activity within patient's tolerance, Monitored during session, Premedicated before session    Home Living Family/patient expects to be discharged to:: Private residence Living Arrangements: Spouse/significant other Available Help at Discharge: Family Type of Home: Other(Comment) (condo) Home Access: Level entry       Home Layout: One level Home Equipment: Agricultural Consultant (2 wheels)      Prior Function Prior Level of Function : Independent/Modified Independent             Mobility Comments: ind ADLs Comments: ind     Extremity/Trunk Assessment   Upper Extremity Assessment Upper Extremity Assessment: Overall WFL for tasks assessed    Lower Extremity Assessment Lower Extremity Assessment: RLE deficits/detail RLE Deficits / Details: ankle WFL, knee extension/hip flexion 3/5, anticipated  post op deficits       Communication   Communication Communication: No apparent difficulties    Cognition Arousal: Alert Behavior During Therapy: WFL for tasks assessed/performed   PT - Cognitive impairments: No apparent impairments                         Following commands: Intact       Cueing Cueing Techniques: Verbal cues     General Comments      Exercises Total Joint Exercises Ankle Circles/Pumps: AROM, Both,  10 reps Quad Sets: Both, 5 reps, AROM   Assessment/Plan    PT Assessment Patient needs continued PT services  PT Problem List Decreased strength;Decreased range of motion;Decreased activity tolerance;Decreased balance;Decreased knowledge of use of DME;Decreased mobility;Pain       PT Treatment Interventions DME instruction;Therapeutic exercise;Gait training;Functional mobility training;Therapeutic activities;Patient/family education    PT Goals (Current goals can be found in the Care Plan section)  Acute Rehab PT Goals Patient Stated Goal: no knee pain, back to exercising PT Goal Formulation: With patient Time For Goal Achievement: 11/19/24 Potential to Achieve Goals: Good    Frequency 7X/week     Co-evaluation               AM-PAC PT 6 Clicks Mobility  Outcome Measure Help needed turning from your back to your side while in a flat bed without using bedrails?: A Little Help needed moving from lying on your back to sitting on the side of a flat bed without using bedrails?: A Little Help needed moving to and from a bed to a chair (including a wheelchair)?: A Little Help needed standing up from a chair using your arms (e.g., wheelchair or bedside chair)?: A Little Help needed to walk in hospital room?: A Little Help needed climbing 3-5 steps with a railing? : A Little 6 Click Score: 18    End of Session Equipment Utilized During Treatment: Gait belt Activity Tolerance: Patient tolerated treatment well Patient left: with call bell/phone within reach;in chair;with chair alarm set;with family/visitor present Nurse Communication: Mobility status PT Visit Diagnosis: Other abnormalities of gait and mobility (R26.89)    Time: 8594-8573 PT Time Calculation (min) (ACUTE ONLY): 21 min   Charges:   PT Evaluation $PT Eval Low Complexity: 1 Low   PT General Charges $$ ACUTE PT VISIT: 1 Visit         Christia Coaxum, PT  Acute Rehab Dept ([facility])  [phone]  11/05/2024   [facility] 11/05/2024, 2:32 PM

## 2024-11-05 NOTE — Transfer of Care (Signed)
 Immediate Anesthesia Transfer of Care Note  Patient: David Goodman  Procedure(s) Performed: ARTHROPLASTY, KNEE, TOTAL (Right: Knee)  Patient Location: PACU  Anesthesia Type:MAC and Spinal  Level of Consciousness: awake  Airway & Oxygen Therapy: Patient Spontanous Breathing and Patient connected to face mask oxygen  Post-op Assessment: Report given to RN and Post -op Vital signs reviewed and stable  Post vital signs: Reviewed and stable  Last Vitals:  Vitals Value Taken Time  BP    Temp    Pulse    Resp    SpO2      Last Pain:  Vitals:   11/05/24 0553  TempSrc:   PainSc: 0-No pain         Complications: No notable events documented.

## 2024-11-05 NOTE — Op Note (Signed)
 OPERATIVE REPORT-TOTAL KNEE ARTHROPLASTY   Pre-operative diagnosis- Osteoarthritis  Right knee(s)  Post-operative diagnosis- Osteoarthritis Right knee(s)  Procedure-  Right  Total Knee Arthroplasty  Surgeon- David GAILS. Emmanuel Gruenhagen, MD  Assistant- Corean Sender, PA-C   Anesthesia-  Adductor canal block and spinal  EBL-50 mL   Drains None  Tourniquet time-  Total Tourniquet Time Documented: Thigh (Right) - 30 minutes Total: Thigh (Right) - 30 minutes     Complications- None  Condition-PACU - hemodynamically stable.   Brief Clinical Note  David Goodman is a 83 y.o. year old male with end stage OA of his right knee with progressively worsening pain and dysfunction. He has constant pain, with activity and at rest and significant functional deficits with difficulties even with ADLs. He has had extensive non-op management including analgesics, injections of cortisone and viscosupplements, and home exercise program, but remains in significant pain with significant dysfunction. Radiographs show bone on bone arthritis lateral and patellofemoral. He presents now for right Total Knee Arthroplasty.     Procedure in detail---   The patient is brought into the operating room and positioned supine on the operating table. After successful administration of  adductor canal block and spinal,   a tourniquet is placed high on the  Right thigh(s) and the lower extremity is prepped and draped in the usual sterile fashion. Time out is performed by the operating team and then the  Right lower extremity is wrapped in Esmarch, knee flexed and the tourniquet inflated to 300 mmHg.       A midline incision is made with a ten blade through the subcutaneous tissue to the level of the extensor mechanism. A fresh blade is used to make a medial parapatellar arthrotomy. Soft tissue over the proximal medial tibia is subperiosteally elevated to the joint line with a knife and into the semimembranosus bursa with a Cobb  elevator. Soft tissue over the proximal lateral tibia is elevated with attention being paid to avoiding the patellar tendon on the tibial tubercle. The patella is everted, knee flexed 90 degrees and the ACL and PCL are removed. Findings are bone on bone lateral and patellofemoral with large global osteophytes        The drill is used to create a starting hole in the distal femur and the canal is thoroughly irrigated with sterile saline to remove the fatty contents. The 5 degree Right  valgus alignment guide is placed into the femoral canal and the distal femoral cutting block is pinned to remove 10 mm off the distal femur. Resection is made with an oscillating saw.      The tibia is subluxed forward and the menisci are removed. The extramedullary alignment guide is placed referencing proximally at the medial aspect of the tibial tubercle and distally along the second metatarsal axis and tibial crest. The block is pinned to remove 2mm off the more deficient lateral  side. Resection is made with an oscillating saw. Size 6is the most appropriate size for the tibia and the proximal tibia is prepared with the modular drill and keel punch for that size.      The femoral sizing guide is placed and size 6 is most appropriate. Rotation is marked off the epicondylar axis and confirmed by creating a rectangular flexion gap at 90 degrees. The size 6 cutting block is pinned in this rotation and the anterior, posterior and chamfer cuts are made with the oscillating saw. The intercondylar block is then placed and that cut is made.  Trial size 6 tibial component, trial size 6 posterior stabilized femur and a 8  mm posterior stabilized rotating platform insert trial is placed. Full extension is achieved with excellent varus/valgus and anterior/posterior balance throughout full range of motion. The patella is everted and thickness measured to be 24  mm. Free hand resection is taken to 14 mm, a 38 template is placed, lug holes  are drilled, trial patella is placed, and it tracks normally. Osteophytes are removed off the posterior femur with the trial in place. All trials are removed and the cut bone surfaces prepared with pulsatile lavage. Cement is mixed and once ready for implantation, the size 6 tibial implant, size  6 posterior stabilized femoral component, and the size 38 patella are cemented in place and the patella is held with the clamp. The trial insert is placed and the knee held in full extension. The Exparel  (20 ml mixed with 60 ml saline) is injected into the extensor mechanism, posterior capsule, medial and lateral gutters and subcutaneous tissues.  All extruded cement is removed and once the cement is hard the permanent 8 mm posterior stabilized rotating platform insert is placed into the tibial tray.      The wound is copiously irrigated with saline solution and the extensor mechanism closed with # 0 Stratofix suture. The tourniquet is released for a total tourniquet time of 30  minutes. Flexion against gravity is 140 degrees and the patella tracks normally. Subcutaneous tissue is closed with 2.0 vicryl and subcuticular with running 4.0 Monocryl. The incision is cleaned and dried and steri-strips and a bulky sterile dressing are applied. The limb is placed into a knee immobilizer and the patient is awakened and transported to recovery in stable condition.      Please note that a surgical assistant was a medical necessity for this procedure in order to perform it in a safe and expeditious manner. Surgical assistant was necessary to retract the ligaments and vital neurovascular structures to prevent injury to them and also necessary for proper positioning of the limb to allow for anatomic placement of the prosthesis.   David ROCKFORD Ainsley Deakins, MD    11/05/2024, 8:22 AM

## 2024-11-06 ENCOUNTER — Encounter (HOSPITAL_COMMUNITY): Payer: Self-pay | Admitting: Orthopedic Surgery

## 2024-11-06 ENCOUNTER — Other Ambulatory Visit (HOSPITAL_COMMUNITY): Payer: Self-pay

## 2024-11-06 LAB — BASIC METABOLIC PANEL WITH GFR
Anion gap: 10 (ref 5–15)
BUN: 16 mg/dL (ref 8–23)
CO2: 25 mmol/L (ref 22–32)
Calcium: 8.2 mg/dL — ABNORMAL LOW (ref 8.9–10.3)
Chloride: 100 mmol/L (ref 98–111)
Creatinine, Ser: 0.99 mg/dL (ref 0.61–1.24)
GFR, Estimated: >60 mL/min (ref >60)
Glucose, Bld: 130 mg/dL — ABNORMAL HIGH (ref 70–99)
Potassium: 3.4 mmol/L — ABNORMAL LOW (ref 3.5–5.1)
Sodium: 134 mmol/L — ABNORMAL LOW (ref 135–145)

## 2024-11-06 LAB — CBC
HCT: 36.2 % — ABNORMAL LOW (ref 39.0–52.0)
Hemoglobin: 12.6 g/dL — ABNORMAL LOW (ref 13.0–17.0)
MCH: 30.3 pg (ref 26.0–34.0)
MCHC: 34.8 g/dL (ref 30.0–36.0)
MCV: 87 fL (ref 80.0–100.0)
Platelets: 196 K/uL (ref 150–400)
RBC: 4.16 MIL/uL — ABNORMAL LOW (ref 4.22–5.81)
RDW: 13.4 % (ref 11.5–15.5)
WBC: 17 K/uL — ABNORMAL HIGH (ref 4.0–10.5)
nRBC: 0 % (ref 0.0–0.2)

## 2024-11-06 MED ORDER — ASPIRIN 81 MG PO CHEW
81.0000 mg | CHEWABLE_TABLET | Freq: Two times a day (BID) | ORAL | 0 refills | Status: AC
  Filled 2024-11-06: qty 63, 42d supply, fill #0

## 2024-11-06 MED ORDER — TRAMADOL HCL 50 MG PO TABS
50.0000 mg | ORAL_TABLET | Freq: Four times a day (QID) | ORAL | 0 refills | Status: DC | PRN
  Filled 2024-11-06: qty 40, 5d supply, fill #0

## 2024-11-06 MED ORDER — METHOCARBAMOL 500 MG PO TABS
500.0000 mg | ORAL_TABLET | Freq: Four times a day (QID) | ORAL | 0 refills | Status: AC | PRN
  Filled 2024-11-06: qty 40, 10d supply, fill #0

## 2024-11-06 MED ORDER — OXYCODONE HCL 5 MG PO TABS
5.0000 mg | ORAL_TABLET | ORAL | 0 refills | Status: DC | PRN
  Filled 2024-11-06: qty 42, 7d supply, fill #0

## 2024-11-06 MED ORDER — ONDANSETRON HCL 4 MG PO TABS
4.0000 mg | ORAL_TABLET | Freq: Four times a day (QID) | ORAL | 0 refills | Status: AC | PRN
  Filled 2024-11-06: qty 20, 5d supply, fill #0

## 2024-11-06 NOTE — Progress Notes (Signed)
 Discharge medications delivered to patient at the bedside.

## 2024-11-06 NOTE — Plan of Care (Signed)

## 2024-11-06 NOTE — Progress Notes (Signed)
 Provided discharge education/instructions, all questions answered. Pt is not in any distress, discharged home with all of his belongings accompanied by his wife.

## 2024-11-06 NOTE — Care Management Obs Status (Signed)
 MEDICARE OBSERVATION STATUS NOTIFICATION   Patient Details  Name: David Goodman MRN: [id number] Date of Birth: [birth year]   Medicare Observation Status Notification Given:  [name], LCSW 11/06/2024, 10:37 AM

## 2024-11-06 NOTE — Progress Notes (Signed)
 Physical Therapy Treatment Patient Details Name: David Goodman MRN: [id number] DOB: 01/02/1941 Today's Date: 11/06/2024   History of Present Illness 83 yo male s/p R TKA on 11/05/24. PMH: sHF, L TKA 2024, spondylolisthesis L5-S1, gait instability, diverticulitis, HTN, GIB.    PT Comments  Pt doing well this session, making excellent progress. Meeting PT goals and is ready to d/c home with spouse assisting as needed.     If plan is discharge home, recommend the following:     Can travel by private vehicle        Equipment Recommendations  None recommended by PT    Recommendations for Other Services       Precautions / Restrictions Precautions Precautions: Fall;Knee Restrictions RLE Weight Bearing Per Provider Order: Weight bearing as tolerated     Mobility  Bed Mobility               General bed mobility comments: in recliner and returned to same    Transfers Overall transfer level: Needs assistance Equipment used: Rolling walker (2 wheels) Transfers: Sit to/from Stand Sit to Stand: Contact guard assist           General transfer comment: cues for hand placement and LE Position, CGA  to rise and steady    Ambulation/Gait Ambulation/Gait assistance: Contact guard assist, Supervision Gait Distance (Feet): 100 Feet Assistive device: Rolling walker (2 wheels) Gait Pattern/deviations: Step-to pattern, Step-through pattern, Decreased stance time - right Gait velocity: decr     General Gait Details: cues for terminal knee ext in stance on R, improved wt shift to R with incr distance. CGA for safety, no overt LOB   Stairs             Wheelchair Mobility     Tilt Bed    Modified Rankin (Stroke Patients Only)       Balance                                            Communication Communication Communication: No apparent difficulties  Cognition Arousal: Alert Behavior During Therapy: WFL for tasks assessed/performed   PT -  Cognitive impairments: No apparent impairments                         Following commands: Intact      Cueing Cueing Techniques: Verbal cues  Exercises Total Joint Exercises Ankle Circles/Pumps: AROM, Both, 10 reps Quad Sets: AROM, Strengthening, 10 reps, Both Heel Slides: AAROM, Right, 5 reps Straight Leg Raises: AROM, Right, 5 reps, AAROM Long Arc Quad: AROM, Right, Seated, 5 reps Knee Flexion: AAROM, Right, 5 reps, Seated    General Comments        Pertinent Vitals/Pain Pain Assessment Pain Assessment: 0-10 Pain Score: 4  Pain Location: right knee Pain Descriptors / Indicators: Sore, Aching Pain Intervention(s): Limited activity within patient's tolerance, Repositioned, Monitored during session, Premedicated before session, Ice applied    Home Living                          Prior Function            PT Goals (current goals can now be found in the care plan section) Acute Rehab PT Goals Patient Stated Goal: no knee pain, back to exercising PT Goal Formulation: With patient Time For Goal  Achievement: 11/19/24 Potential to Achieve Goals: Good Progress towards PT goals: Progressing toward goals    Frequency    7X/week      PT Plan      Co-evaluation              AM-PAC PT 6 Clicks Mobility   Outcome Measure  Help needed turning from your back to your side while in a flat bed without using bedrails?: A Little Help needed moving from lying on your back to sitting on the side of a flat bed without using bedrails?: A Little Help needed moving to and from a bed to a chair (including a wheelchair)?: A Little Help needed standing up from a chair using your arms (e.g., wheelchair or bedside chair)?: A Little Help needed to walk in hospital room?: A Little Help needed climbing 3-5 steps with a railing? : A Little 6 Click Score: 18    End of Session Equipment Utilized During Treatment: Gait belt Activity Tolerance: Patient  tolerated treatment well Patient left: in chair;with call bell/phone within reach;with chair alarm set;with family/visitor present Nurse Communication: Mobility status PT Visit Diagnosis: Other abnormalities of gait and mobility (R26.89)     Time: 1001-1025 PT Time Calculation (min) (ACUTE ONLY): 24 min  Charges:    $Gait Training: 8-22 mins $Therapeutic Exercise: 8-22 mins PT General Charges $$ ACUTE PT VISIT: 1 Visit                     Rexene, PT  Acute Rehab Dept [facility]) [phone]  11/06/2024    [facility] 11/06/2024, 10:45 AM

## 2024-11-06 NOTE — TOC Transition Note (Signed)
 Transition of Care [facility]) - Discharge Note   Patient Details  Name: David Goodman MRN: [id number] Date of Birth: [birth year]  [facility]) CM/SW Contact:  NORMAN ASPEN, LCSW Phone Number: 11/06/2024, 9:38 AM   Clinical Narrative:     Met with pt who confirms he has needed DME in the home.   OPPT already arranged with [facility].  No further IP CM needs.  Final next level of care: OP Rehab Barriers to Discharge: No Barriers Identified   Patient Goals and CMS Choice Patient states their goals for this hospitalization and ongoing recovery are:: return home          Discharge Placement                       Discharge Plan and Services Additional resources added to the After Visit Summary for                  DME Arranged: N/A DME Agency: NA                  Social Drivers of Health (SDOH) Interventions SDOH Screenings   Food Insecurity: No Food Insecurity (11/05/2024)  Housing: Low Risk  (11/05/2024)  Transportation Needs: No Transportation Needs (11/05/2024)  Utilities: Not At Risk (11/05/2024)  Alcohol  Screen: Low Risk  (09/07/2024)  Depression (PHQ2-9): Low Risk  (02/14/2024)  Financial Resource Strain: Low Risk  (09/07/2024)  Physical Activity: Insufficiently Active (09/07/2024)  Social Connections: Socially Integrated (11/05/2024)  Stress: No Stress Concern Present (09/07/2024)  Tobacco Use: Low Risk  (11/05/2024)  Health Literacy: Adequate Health Literacy (01/16/2024)     Readmission Risk Interventions     No data to display

## 2024-11-06 NOTE — Plan of Care (Signed)
  Problem: Pain Managment: Goal: General experience of comfort will improve and/or be controlled Outcome: Progressing   Problem: Activity: Goal: Ability to avoid complications of mobility impairment will improve Outcome: Progressing Goal: Range of joint motion will improve Outcome: Progressing   Problem: Clinical Measurements: Goal: Postoperative complications will be avoided or minimized Outcome: Progressing   Problem: Pain Management: Goal: Pain level will decrease with appropriate interventions Outcome: Progressing

## 2024-11-06 NOTE — Progress Notes (Signed)
 Subjective: 1 Day Post-Op Procedure(s) (LRB): ARTHROPLASTY, KNEE, TOTAL (Right) Patient reports pain as mild.   Patient seen in rounds by Dr. Melodi. Patient is well, and has had no acute complaints or problems No issues overnight. Denies chest pain, SOB, or calf pain. Foley catheter removed this AM.  We will continue therapy today, ambulated 60' yesterday.   Objective: Vital signs in last 24 hours: Temp:  [97.3 F (36.3 C)-98.4 F (36.9 C)] 97.7 F (36.5 C) (12/09 0615) Pulse Rate:  [55-93] 93 (12/09 0615) Resp:  [13-19] 18 (12/09 0615) BP: (88-172)/(57-94) 172/94 (12/09 0615) SpO2:  [94 %-100 %] 94 % (12/09 0615)  Intake/Output from previous day:  Intake/Output Summary (Last 24 hours) at 11/06/2024 0753 Last data filed at 11/06/2024 0546 Gross per 24 hour  Intake 2431.65 ml  Output 2635 ml  Net -203.35 ml     Intake/Output this shift: No intake/output data recorded.  Labs: Recent Labs    11/06/24 0319  HGB 12.6*   Recent Labs    11/06/24 0319  WBC 17.0*  RBC 4.16*  HCT 36.2*  PLT 196   Recent Labs    11/06/24 0319  NA 134*  K 3.4*  CL 100  CO2 25  BUN 16  CREATININE 0.99  GLUCOSE 130*  CALCIUM  8.2*   No results for input(s): LABPT, INR in the last 72 hours.  Exam: General - Patient is Alert and Oriented Extremity - Neurologically intact Neurovascular intact Sensation intact distally Dorsiflexion/Plantar flexion intact Dressing - dressing C/D/I Motor Function - intact, moving foot and toes well on exam.   Past Medical History:  Diagnosis Date   ALLERGIC RHINITIS 07/13/2007   Aorta dilated on Echo; Normal sized Aorta on CT    Chest CT 1/23: No thoracic aortic aneurysm; coronary calcifications; aortic atherosclerosis; improved aeration of lungs with persistent minimal reticular opacities   Arthritis    Blood transfusion without reported diagnosis    CAD (coronary artery disease)    CHF (congestive heart failure) (HCC)    Clotting  disorder    Diverticulitis    GERD (gastroesophageal reflux disease)    GI bleed    Glaucoma    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)    History of hiatal hernia    HYPERLIPIDEMIA 07/13/2007   HYPERTENSION 07/13/2007   Impaired glucose tolerance 09/27/2016   Iron deficiency anemia due to chronic blood loss 02/27/2021   NEPHROLITHIASIS, 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   NSVT (nonsustained ventricular tachycardia) (HCC)    PEPTIC ULCER DISEASE 07/13/2007   PVC's (premature ventricular contractions)    Sleep apnea    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   Weight as of this encounter: 78.9 kg. Advance diet Up with therapy D/C IV fluids   Patient's anticipated LOS is less than 2 midnights, meeting these requirements: - Younger than 63 - Lives within 1 hour of care - Has a competent adult at home to recover with post-op recover - NO history of  - Chronic pain requiring opiods  - Diabetes  - Heart failure  - Heart attack  - Stroke  - DVT/VTE  - Cardiac arrhythmia  - Respiratory Failure/COPD  - Renal failure  -  Anemia  - Advanced Liver disease     DVT Prophylaxis - Aspirin  Weight bearing as tolerated. Continue therapy.  Plan is to go Home after hospital stay. Plan for discharge later today if progresses with therapy and meeting goals. Scheduled for OPPT at [facility] Follow-up in the office in 2 weeks.  The PDMP database was reviewed today prior to any opioid medications being prescribed to this patient.  Roxie Mess, PA-C Orthopedic Surgery [phone] 11/06/2024, 7:53 AM

## 2024-11-07 NOTE — Discharge Summary (Signed)
 [name] MRN: [id number] DOB/AGE: [birth year] 83 y.o.  Admit date: 11/05/2024 Discharge date: 11/06/2024  Admission Diagnoses:  Principal Problem:   OA (osteoarthritis) of knee Active Problems:   Primary osteoarthritis of right knee   Discharge Diagnoses:  Same  Past Medical History:  Diagnosis Date   ALLERGIC RHINITIS 07/13/2007   Aorta dilated on Echo; Normal sized Aorta on CT    Chest CT 1/23: No thoracic aortic aneurysm; coronary calcifications; aortic atherosclerosis; improved aeration of lungs with persistent minimal reticular opacities   Arthritis    Blood transfusion without reported diagnosis    CAD (coronary artery disease)    CHF (congestive heart failure) (HCC)    Clotting disorder    Diverticulitis    GERD (gastroesophageal reflux disease)    GI bleed    Glaucoma    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 male who was admitted 11/05/2024 for operative treatment ofOA (osteoarthritis) of knee. Patient has severe unremitting pain that affects sleep, daily activities, and work/hobbies. After pre-op clearance the patient was taken to the operating room on 11/05/2024 and underwent  Procedure(s): ARTHROPLASTY, KNEE, TOTAL.    Patient was given perioperative antibiotics:  Anti-infectives (From admission, onward)    Start     Dose/Rate Route Frequency Ordered  Stop   11/05/24 1330  ceFAZolin  (ANCEF ) IVPB 2g/100 mL premix        2 g 200 mL/hr over 30 Minutes Intravenous Every 6 hours 11/05/24 1011 11/06/24 1002   11/05/24 0600  ceFAZolin  (ANCEF ) IVPB 2g/100 mL premix        2 g 200 mL/hr over 30 Minutes Intravenous On call to O.R. 11/05/24 [id number] 11/05/24 0723        Patient was given sequential compression devices, early ambulation, and chemoprophylaxis to prevent DVT.  Patient benefited maximally from hospital stay and there were no complications.    Recent vital signs: Patient Vitals for the past 24 hrs:  BP Temp Temp src Pulse Resp SpO2  11/06/24 0934 (!) 160/84 97.9 F (36.6 C) Oral 86 16 91 %     Recent laboratory studies:  Recent Labs    11/06/24 0319  WBC 17.0*  HGB 12.6*  HCT 36.2*  PLT 196  NA 134*  K 3.4*  CL 100  CO2 25  BUN 16  CREATININE 0.99  GLUCOSE 130*  CALCIUM  8.2*     Discharge Medications:   Allergies as of 11/06/2024   No Known Allergies      Medication List     STOP taking these medications    HYDROcodone  bit-homatropine 5-1.5 MG/5ML syrup Commonly known as: HYCODAN   triamcinolone  cream 0.1 % Commonly known as: KENALOG        TAKE these medications    albuterol  108 (90 Base) MCG/ACT inhaler Commonly known as: VENTOLIN  HFA Inhale 2 puffs into the lungs every 4 (four) hours as needed for wheezing or shortness of  breath. Notes to patient: Go back to taking this.   albuterol  (5 MG/ML) 0.5% nebulizer solution Commonly known as: PROVENTIL  Take 0.5 mLs (2.5 mg total) by nebulization every 6 (six) hours as needed for wheezing or shortness of breath. Notes to patient: Go back to taking this.   Aspirin  Low Dose 81 MG chewable tablet Generic drug: aspirin  Chew 1 tablet (81 mg total) by mouth 2 (two) times daily for 21 days. Then take one 81 mg aspirin  once a day for 21 days. Then discontinue aspirin .   benzonatate  100 MG capsule Commonly known as: TESSALON  Take 1 capsule (100 mg total)  by mouth 3 (three) times daily as needed for cough. Notes to patient: Go back to taking this.   cetirizine  10 MG tablet Commonly known as: ZYRTEC  Take 1 tablet (10 mg total) by mouth daily. Notes to patient: Go back to taking this.   clotrimazole -betamethasone  cream Commonly known as: LOTRISONE  Apply 1 Application topically daily. Notes to patient: Go back to taking this.   fluticasone  50 MCG/ACT nasal spray Commonly known as: FLONASE  Place 2 sprays into both nostrils daily. What changed:  when to take this reasons to take this Notes to patient: Go back to taking this.   hydrochlorothiazide  25 MG tablet Commonly known as: HYDRODIURIL  Take 1 tablet (25 mg total) by mouth daily.   hydrOXYzine 10 MG tablet Commonly known as: ATARAX Take 1 tablet (10 mg total) by mouth every 6 (six) hours as needed for itching. Notes to patient: Go back to taking this.   latanoprost  0.005 % ophthalmic solution Commonly known as: XALATAN  Place 1 drop into both eyes at bedtime. Notes to patient: Go back to taking this.   Magnesium  400 MG Tabs Take 400 mg by mouth at bedtime. Notes to patient: Go back to taking this.   methocarbamol  500 MG tablet Commonly known as: ROBAXIN  Take 1 tablet (500 mg total) by mouth every 6 (six) hours as needed for muscle spasms. Notes to patient: Last time you took this was today at 04:49am   nitroGLYCERIN  0.4 MG SL tablet Commonly known as: NITROSTAT  Place 1 tablet (0.4 mg total) under the tongue every 5 (five) minutes as needed for chest pain. Notes to patient: Go back to taking this.   ondansetron  4 MG tablet Commonly known as: ZOFRAN  Take 1 tablet (4 mg total) by mouth every 6 (six) hours as needed for nausea. Notes to patient: Last time you took this was yesterday at 08:10am   oxyCODONE  5 MG immediate release tablet Commonly known as: Oxy IR/ROXICODONE  Take 1 tablet (5 mg total) by mouth every 4 (four) hours as needed for severe pain (pain score  7-10). Notes to patient: Last time you took this was today at 09:41am   pantoprazole  40 MG tablet Commonly known as: PROTONIX  Take 1 tablet by mouth once daily   predniSONE  10 MG tablet Commonly known as: DELTASONE  Take 4 tablets (40 mg total) by mouth daily.   psyllium 58.6 % powder Commonly known as: METAMUCIL Take 1 packet by mouth daily. Notes to patient: Go back to taking this.   REFRESH OP Place 1 drop into the left eye 2 (two) times daily as needed (dryness). Notes to patient: Go back to taking this.   rosuvastatin  20 MG tablet Commonly known as: CRESTOR  Take 1 tablet (20 mg total) by mouth daily.   tadalafil  10 MG tablet Commonly known as: CIALIS  Take 1 tablet (10 mg total) by mouth every other day as needed  for erectile dysfunction. Notes to patient: Go back to taking this.   tamsulosin  0.4 MG Caps capsule Commonly known as: FLOMAX  TAKE 1 CAPSULE BY MOUTH DAILY   traMADol  50 MG tablet Commonly known as: ULTRAM  Take 1-2 tablets (50-100 mg total) by mouth every 6 (six) hours as needed for moderate pain (pain score 4-6). Notes to patient: Last time you took this was yesterday at 09:04pm   valsartan  320 MG tablet Commonly known as: DIOVAN  Take 1 tablet by mouth once daily   vitamin C 1000 MG tablet Take 1,000 mg by mouth 2 (two) times a week. Notes to patient: Go back to taking this.   zinc gluconate 50 MG tablet Take 50 mg by mouth daily. Notes to patient: Go back to taking this.               Discharge Care Instructions  (From admission, onward)           Start     Ordered   11/06/24 0000  Weight bearing as tolerated        11/06/24 0755   11/06/24 0000  Change dressing       Comments: You may remove the bulky bandage (ACE wrap and gauze) two days after surgery. You will have an adhesive waterproof bandage underneath. Leave this in place until your first follow-up appointment.   11/06/24 0755            Diagnostic Studies: DG Chest  Port 1 View Result Date: 10/22/2024 EXAM: 1 VIEW(S) XRAY OF THE CHEST 10/22/2024 02:35:00 PM COMPARISON: 11/10/2023 CLINICAL HISTORY: SOB (shortness of breath) FINDINGS: LUNGS AND PLEURA: Low lung volumes. Elevation of the right hemidiaphragm. Linear opacities at right lung base, likely atelectasis. No pleural effusion. No pneumothorax. HEART AND MEDIASTINUM: Aortic atherosclerosis. No acute abnormality of the cardiac and mediastinal silhouettes. BONES AND SOFT TISSUES: Multilevel degenerative disc disease of the spine. No acute osseous abnormality. IMPRESSION: 1. Low lung volumes with  linear right basilar opacities, likely atelectasis. Electronically signed by: Rogelia Myers MD 10/22/2024 03:19 PM EST RP Workstation: [id number]    Disposition: Discharge disposition: 01-Home or Self Care       Discharge Instructions     Call MD / Call 911   Complete by: As directed    If you experience chest pain or shortness of breath, CALL 911 and be transported to the hospital emergency room.  If you develope a fever above 101 F, pus (white drainage) or increased drainage or redness at the wound, or calf pain, call your surgeon's office.   Change dressing   Complete by: As directed    You may remove the bulky bandage (ACE wrap and gauze) two days after surgery. You will have an adhesive waterproof bandage underneath. Leave this in place until your first follow-up appointment.   Constipation Prevention   Complete by: As directed    Drink plenty of fluids.  Prune juice may be helpful.  You may use a stool softener, such as Colace (over the counter) 100 mg twice a day.  Use MiraLax  (over the counter) for constipation as needed.   Diet - low sodium heart healthy   Complete by: As directed    Do not put a pillow under the knee. Place it under the heel.   Complete by: As directed    Driving restrictions   Complete by: As directed    No driving for two weeks   Post-operative opioid taper instructions:    Complete by:  As directed    POST-OPERATIVE OPIOID TAPER INSTRUCTIONS: It is important to wean off of your opioid medication as soon as possible. If you do not need pain medication after your surgery it is ok to stop day one. Opioids include: Codeine , Hydrocodone (Norco, Vicodin), Oxycodone (Percocet, oxycontin ) and hydromorphone  amongst others.  Long term and even short term use of opiods can cause: Increased pain response Dependence Constipation Depression Respiratory depression And more.  Withdrawal symptoms can include Flu like symptoms Nausea, vomiting And more Techniques to manage these symptoms Hydrate well Eat regular healthy meals Stay active Use relaxation techniques(deep breathing, meditating, yoga) Do Not substitute Alcohol  to help with tapering If you have been on opioids for less than two weeks and do not have pain than it is ok to stop all together.  Plan to wean off of opioids This plan should start within one week post op of your joint replacement. Maintain the same interval or time between taking each dose and first decrease the dose.  Cut the total daily intake of opioids by one tablet each day Next start to increase the time between doses. The last dose that should be eliminated is the evening dose.      TED hose   Complete by: As directed    Use stockings (TED hose) for three weeks on both leg(s).  You may remove them at night for sleeping.   Weight bearing as tolerated   Complete by: As directed         Follow-up Information     Melodi Lerner, MD. Go on 11/21/2024.   Specialty: Orthopedic Surgery Why: You are scheduled for a post op appointment on Wednesday 11/21/24 at 10:00am Contact information: [address] [place] [address] [phone]         [facility] Go on 11/08/2024.   Why: You are scheduled for physical therapy Thursday 11/08/24 at 2:00pm; [address] Contact information: [address] [place] [address] [phone]                  Signed: Roxie Mess 11/07/2024, 8:42 AM

## 2024-12-09 ENCOUNTER — Other Ambulatory Visit: Payer: Self-pay | Admitting: Interventional Cardiology

## 2024-12-12 ENCOUNTER — Ambulatory Visit (INDEPENDENT_AMBULATORY_CARE_PROVIDER_SITE_OTHER): Admitting: Family Medicine

## 2024-12-12 VITALS — BP 120/64 | HR 67 | Temp 97.9°F | Ht 64.5 in | Wt 167.0 lb

## 2024-12-12 DIAGNOSIS — E785 Hyperlipidemia, unspecified: Secondary | ICD-10-CM | POA: Diagnosis not present

## 2024-12-12 DIAGNOSIS — I10 Essential (primary) hypertension: Secondary | ICD-10-CM

## 2024-12-12 DIAGNOSIS — I5022 Chronic systolic (congestive) heart failure: Secondary | ICD-10-CM

## 2024-12-12 DIAGNOSIS — J8410 Pulmonary fibrosis, unspecified: Secondary | ICD-10-CM

## 2024-12-12 DIAGNOSIS — Z96653 Presence of artificial knee joint, bilateral: Secondary | ICD-10-CM | POA: Diagnosis not present

## 2024-12-12 DIAGNOSIS — I679 Cerebrovascular disease, unspecified: Secondary | ICD-10-CM | POA: Diagnosis not present

## 2024-12-12 DIAGNOSIS — I110 Hypertensive heart disease with heart failure: Secondary | ICD-10-CM | POA: Diagnosis not present

## 2024-12-12 DIAGNOSIS — I2510 Atherosclerotic heart disease of native coronary artery without angina pectoris: Secondary | ICD-10-CM

## 2024-12-12 DIAGNOSIS — N529 Male erectile dysfunction, unspecified: Secondary | ICD-10-CM | POA: Diagnosis not present

## 2024-12-12 MED ORDER — TADALAFIL 20 MG PO TABS: 20.0000 mg | ORAL_TABLET | ORAL | 5 refills | Status: AC | PRN

## 2024-12-12 MED ORDER — ROSUVASTATIN CALCIUM 20 MG PO TABS: 20.0000 mg | ORAL_TABLET | Freq: Every day | ORAL | 0 refills | Status: AC

## 2024-12-12 MED ORDER — VALSARTAN 320 MG PO TABS: 320.0000 mg | ORAL_TABLET | Freq: Every day | ORAL | 3 refills | Status: AC

## 2024-12-12 NOTE — Assessment & Plan Note (Addendum)
 LDL cholesterol is at goal. Continue rosuvastatin  20 mg daily.

## 2024-12-12 NOTE — Assessment & Plan Note (Signed)
 Blood pressure is in good control. Continue hydrochlorothiazide  25 mg daily and valsartan  320 mg daily.

## 2024-12-12 NOTE — Assessment & Plan Note (Signed)
 Improving since surgery

## 2024-12-12 NOTE — Assessment & Plan Note (Signed)
 No current issue with SVT. Continue to follow with cardiology.

## 2024-12-12 NOTE — Assessment & Plan Note (Signed)
 Stable. No current angina. Continue aspirin 81 mg daily.

## 2024-12-12 NOTE — Assessment & Plan Note (Signed)
 [name] finds 20 mg works better for him. I will switch him to the higher dose pill.

## 2024-12-12 NOTE — Progress Notes (Signed)
 " [facility] [address] [place] [address] Dept: [phone] Dept Fax: [phone]  Chronic Care Office Visit  Subjective:    [name], male    DOB: March 18, 1941, 84 y.o..   MRN: [id number]  Chief Complaint  Patient presents with   Follow-up    F/u meds.   C/o still having a dry cough at night.    History of Present Illness:  Patient is in today for reassessment of chronic medical conditions.   Mr. Einstein has a history of hypertension, CAD, and HFrEF. He is managed on a daily aspirin , HCTZ 25 mg daily and valsartan  320 mg daily. His elevated lipids are managed with rosuvastatin  20 mg daily.   Mr. Tith had a right total knee joint replacement on 11/05/2024. He notes his pain is less when walking and he is progressing with his physical therapy. He is now off of his other pain medications.  Mr. Moline notes he had bronchitis in late November. He has had some mild persistent dry cough ever since that time. 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 09/09/2022   Gait instability 09/09/2022   Acute right-sided low back pain without sciatica 09/03/2022   History of colonic polyps 07/22/2022   Gastro-esophageal reflux disease with esophagitis  07/22/2022   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   OSA on CPAP 12/13/2021   Coronary artery disease involving native coronary artery of native heart 09/21/2021   SOBOE (shortness of breath on exertion) 09/21/2021   HFrEF (heart failure with reduced ejection fraction) (HCC)    Cerebrovascular disease 06/05/2021   Hiatal hernia 05/29/2021   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   Erectile dysfunction due to arterial insufficiency 02/27/2021   Hemorrhoids 07/17/2020   Syncope 11/15/2018   Diverticulitis 05/25/2018   Osteoarthritis of right knee 12/13/2017   OA (osteoarthritis) of knee 12/13/2017   Testosterone  deficiency 03/22/2017   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/27/2016   BPH (benign prostatic hyperplasia) 06/18/2013   HLD (hyperlipidemia) 07/13/2007   Essential hypertension 07/13/2007   Allergic rhinitis 07/13/2007   History of nephrolithiasis 07/13/2007   Past Surgical History:  Procedure Laterality Date   BROW LIFT Bilateral 11/10/2020   Procedure: BLEPHAROPLASTY;  Surgeon: Elisabeth Craig RAMAN, MD;  Location: [facility];  Service: Plastics;  Laterality: Bilateral;  1 hour   COLONOSCOPY N/A 04/12/2022   Procedure: COLONOSCOPY;  Surgeon: Burnette Fallow, MD;  Location: [facility];  Service: Gastroenterology;  Laterality: N/A;   COLONOSCOPY WITH PROPOFOL  N/A 02/17/2019   Procedure: COLONOSCOPY WITH PROPOFOL ;  Surgeon: Saintclair Jasper, MD;  Location: [facility];  Service: Gastroenterology;  Laterality: N/A;   CORONARY STENT INTERVENTION N/A 08/25/2021   Procedure: CORONARY STENT INTERVENTION;  Surgeon: Dann Candyce RAMAN, MD;  Location[facility] INVASIVE CV LAB;  Service: Cardiovascular;  Laterality:  N/A;   CORONARY ULTRASOUND/IVUS N/A 08/25/2021   Procedure: Intravascular Ultrasound/IVUS;  Surgeon: Dann Candyce RAMAN, MD;  Location: [facility] INVASIVE CV LAB;  Service: Cardiovascular;  Laterality: N/A;   EYE SURGERY      HYDROCELE EXCISION Right 05/15/2003   IR ANGIOGRAM VISCERAL SELECTIVE  02/16/2019   IR ANGIOGRAM VISCERAL SELECTIVE  02/16/2019   IR ANGIOGRAM VISCERAL SELECTIVE  02/16/2019   IR US  GUIDE VASC ACCESS RIGHT  02/16/2019   JOINT REPLACEMENT     LEFT HEART CATH AND CORONARY ANGIOGRAPHY N/A 07/28/2021   Procedure: LEFT HEART CATH AND CORONARY ANGIOGRAPHY;  Surgeon: Dann Candyce RAMAN, MD;  Location: [facility] INVASIVE CV LAB;  Service: Cardiovascular;  Laterality: N/A;   LEFT HEART CATH AND CORONARY ANGIOGRAPHY N/A 08/25/2021   Procedure: LEFT HEART CATH AND CORONARY ANGIOGRAPHY;  Surgeon: Dann Candyce RAMAN, MD;  Location: [facility] INVASIVE CV LAB;  Service: Cardiovascular;  Laterality: N/A;   PERIPHERAL INTRAVASCULAR LITHOTRIPSY  08/25/2021   Procedure: INTRAVASCULAR LITHOTRIPSY;  Surgeon: Dann Candyce RAMAN, MD;  Location: [facility] INVASIVE CV LAB;  Service: Cardiovascular;;   ROTATOR CUFF REPAIR     SPERMATOCELECTOMY Right 05/15/2003   TOTAL KNEE ARTHROPLASTY Left 07/11/2023   Procedure: TOTAL KNEE ARTHROPLASTY;  Surgeon: Melodi Lerner, MD;  Location: [facility];  Service: Orthopedics;  Laterality: Left;   TOTAL KNEE ARTHROPLASTY Right 11/05/2024   Procedure: ARTHROPLASTY, KNEE, TOTAL;  Surgeon: Melodi Lerner, MD;  Location: [facility];  Service: Orthopedics;  Laterality: Right;   Family History  Problem Relation Age of Onset   Cancer Brother    Outpatient Medications Prior to Visit  Medication Sig Dispense Refill   albuterol  (PROVENTIL ) (5 MG/ML) 0.5% nebulizer solution Take 0.5 mLs (2.5 mg total) by nebulization every 6 (six) hours as needed for wheezing or shortness of breath. 20 mL 12   albuterol  (VENTOLIN  HFA) 108 (90 Base) MCG/ACT inhaler Inhale 2 puffs into the lungs every 4 (four) hours as needed for wheezing or shortness of breath. 8 g 0   Ascorbic Acid (VITAMIN C) 1000 MG tablet Take 1,000 mg by mouth 2 (two) times a week.     aspirin  81 MG chewable tablet Chew 1 tablet (81 mg total) by mouth 2  (two) times daily for 21 days. Then take one 81 mg aspirin  once a day for 21 days. Then discontinue aspirin . 63 tablet 0   benzonatate  (TESSALON ) 100 MG capsule Take 1 capsule (100 mg total) by mouth 3 (three) times daily as needed for cough. 30 capsule 0   cetirizine  (ZYRTEC ) 10 MG tablet Take 1 tablet (10 mg total) by mouth daily. 90 tablet 2   clotrimazole -betamethasone  (LOTRISONE ) cream Apply 1 Application topically daily. (Patient not taking: Reported on 10/17/2024) 30 g 0   fluticasone  (FLONASE ) 50 MCG/ACT nasal spray Place 2 sprays into both nostrils daily. (Patient taking differently: Place 2 sprays into both nostrils daily as needed for allergies.) 16 g 6   hydrochlorothiazide  (HYDRODIURIL ) 25 MG tablet Take 1 tablet (25 mg total) by mouth daily. 90 tablet 3   hydrOXYzine  (ATARAX ) 10 MG tablet Take 1 tablet (10 mg total) by mouth every 6 (six) hours as needed for itching. 20 tablet 0   latanoprost  (XALATAN ) 0.005 % ophthalmic solution Place 1 drop into both eyes at bedtime. 7.5 mL 1   Magnesium  400 MG TABS Take 400 mg by mouth at bedtime.     methocarbamol  (ROBAXIN ) 500 MG tablet Take 1 tablet (500 mg total) by mouth every 6 (six) hours as needed  for muscle spasms. 40 tablet 0   nitroGLYCERIN  (NITROSTAT ) 0.4 MG SL tablet Place 1 tablet (0.4 mg total) under the tongue every 5 (five) minutes as needed for chest pain. 25 tablet 3   ondansetron  (ZOFRAN ) 4 MG tablet Take 1 tablet (4 mg total) by mouth every 6 (six) hours as needed for nausea. 20 tablet 0   oxyCODONE  (OXY IR/ROXICODONE ) 5 MG immediate release tablet Take 1 tablet (5 mg total) by mouth every 4 (four) hours as needed for severe pain (pain score 7-10). 42 tablet 0   pantoprazole  (PROTONIX ) 40 MG tablet Take 1 tablet by mouth once daily 90 tablet 1   Polyvinyl Alcohol -Povidone (REFRESH OP) Place 1 drop into the left eye 2 (two) times daily as needed (dryness).     predniSONE  (DELTASONE ) 10 MG tablet Take 4 tablets (40 mg total) by  mouth daily. 20 tablet 0   psyllium (METAMUCIL) 58.6 % powder Take 1 packet by mouth daily.     rosuvastatin  (CRESTOR ) 20 MG tablet Take 1 tablet (20 mg total) by mouth daily. 90 tablet 0   tadalafil  (CIALIS ) 10 MG tablet Take 1 tablet (10 mg total) by mouth every other day as needed for erectile dysfunction. 10 tablet 1   tamsulosin  (FLOMAX ) 0.4 MG CAPS capsule TAKE 1 CAPSULE BY MOUTH DAILY 100 capsule 2   traMADol  (ULTRAM ) 50 MG tablet Take 1-2 tablets (50-100 mg total) by mouth every 6 (six) hours as needed for moderate pain (pain score 4-6). 40 tablet 0   valsartan  (DIOVAN ) 320 MG tablet Take 1 tablet by mouth once daily 90 tablet 0   zinc gluconate 50 MG tablet Take 50 mg by mouth daily.     No facility-administered medications prior to visit.   Allergies[1]   Objective:   Today's Vitals   12/12/24 1353  BP: 120/64  Pulse: 67  Temp: 97.9 F (36.6 C)  TempSrc: Temporal  SpO2: 98%  Weight: 167 lb (75.8 kg)  Height: 5' 4.5 (1.638 m)   Body mass index is 28.22 kg/m.   General: Well developed, well nourished. No acute distress. Lungs: Clear to auscultation bilaterally. No wheezing, rales or rhonchi. Extremities: Right knee is mildly swollen and warm, but surgical wound is well healed. Trace-1+ lower right leg   edema. Psych: Alert and oriented. Normal mood and affect.  Health Maintenance Due  Topic Date Due   COVID-19 Vaccine (5 - 2025-26 season) 07/30/2024   Medicare Annual Wellness (AWV)  01/15/2025   Imaging DG Chest Port 1 View Result Date: 10/22/2024  IMPRESSION: 1. Low lung volumes with  linear right basilar opacities, likely atelectasis.   Lab Results:    Latest Ref Rng & Units 11/06/2024    3:19 AM 10/22/2024    1:50 PM 10/22/2024    9:55 AM  CBC  WBC 4.0 - 10.5 K/uL 17.0  11.8  11.0   Hemoglobin 13.0 - 17.0 g/dL 87.3  85.8  85.9   Hematocrit 39.0 - 52.0 % 36.2  40.8  41.7   Platelets 150 - 400 K/uL 196  261  257       Latest Ref Rng & Units 11/06/2024     3:19 AM 10/22/2024    1:50 PM 10/22/2024    9:55 AM  CMP  Glucose 70 - 99 mg/dL 869  96  893   BUN 8 - 23 mg/dL 16  26  29    Creatinine 0.61 - 1.24 mg/dL 9.00  8.64  8.77   Sodium  135 - 145 mmol/L 134  140  139   Potassium 3.5 - 5.1 mmol/L 3.4  3.9  3.9   Chloride 98 - 111 mmol/L 100  101  102   CO2 22 - 32 mmol/L 25  25  24    Calcium  8.9 - 10.3 mg/dL 8.2  9.3  9.4      Assessment & Plan:   Problem List Items Addressed This Visit       Cardiovascular and Mediastinum   Cerebrovascular disease   Relevant Medications   valsartan  (DIOVAN ) 320 MG tablet   rosuvastatin  (CRESTOR ) 20 MG tablet   tadalafil  (CIALIS ) 20 MG tablet   Chronic systolic heart failure (HCC)   Compensated. Continue valsartan  320 mg daily.      Relevant Medications   valsartan  (DIOVAN ) 320 MG tablet   rosuvastatin  (CRESTOR ) 20 MG tablet   tadalafil  (CIALIS ) 20 MG tablet   Coronary artery disease involving native coronary artery of native heart   Stable. No current angina. Continue aspirin  81 mg daily.      Relevant Medications   valsartan  (DIOVAN ) 320 MG tablet   rosuvastatin  (CRESTOR ) 20 MG tablet   tadalafil  (CIALIS ) 20 MG tablet   Essential hypertension   Blood pressure is in good control. Continue hydrochlorothiazide  25 mg daily and valsartan  320 mg daily.       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 Continue rosuvastatin  20 mg daily.      Relevant Medications   valsartan  (DIOVAN ) 320 MG tablet   rosuvastatin  (CRESTOR ) 20 MG tablet   tadalafil  (CIALIS ) 20 MG tablet   Knee joint replacement status, bilateral   Improving since surgery.      Vasculogenic erectile dysfunction   Mr. Lengyel finds 20 mg works better for him. I will switch him  to the higher dose pill.      Relevant Medications   tadalafil  (CIALIS ) 20 MG tablet    Return in about 3 months (around 03/12/2025) for Reassessment.   Garnette CHRISTELLA Simpler, MD  I,Emily Lagle,acting as a scribe for Garnette CHRISTELLA Simpler, MD.,have documented all relevant documentation on the behalf of Garnette CHRISTELLA Simpler, MD.  I, Garnette CHRISTELLA Simpler, MD, have reviewed all documentation for this visit. The documentation on 12/12/2024 for the exam, diagnosis, procedures, and orders are all accurate and complete.     [1] No Known Allergies  "

## 2024-12-12 NOTE — Assessment & Plan Note (Signed)
 Compensated. Continue valsartan  320 mg daily.

## 2024-12-27 ENCOUNTER — Encounter: Payer: Self-pay | Admitting: Family Medicine

## 2024-12-27 ENCOUNTER — Ambulatory Visit: Admitting: Family Medicine

## 2024-12-27 ENCOUNTER — Ambulatory Visit

## 2024-12-27 VITALS — BP 130/84 | HR 79 | Temp 97.7°F | Ht 64.5 in | Wt 165.8 lb

## 2024-12-27 DIAGNOSIS — S300XXA Contusion of lower back and pelvis, initial encounter: Secondary | ICD-10-CM | POA: Diagnosis not present

## 2024-12-27 NOTE — Progress Notes (Signed)
 " [facility] [address] [place] [address] Dept: [phone] Dept Fax: [phone]  Office Visit  Subjective:    [name], male    DOB: 1941-07-19, 84 y.o..   MRN: [id number]  Chief Complaint  Patient presents with   Tailbone Pain    C/o having tailbone pain after falling on ice (2 days ago).  Taking Tylenol .   LT ear(something in it) and dry cough.     History of Present Illness:  Patient is in today for tailbone pain following a fall. David Goodman notes that on Tuesday, he was getting groceries out of the rear compartment of his car. He suddenly slipped on the ice, landing on his buttocks. He was able to hold on to the door handle and get to standing. since then, he has had pain in the upper gluteal cleft area. He had a right total knee joint arthroplasty in early December. He ntoes he has had some mild increased knee discomfort since the fall, but is able to ambulate okay.  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 09/09/2022   Gait instability 09/09/2022   Acute right-sided low back pain without sciatica 09/03/2022   History of colonic polyps 07/22/2022   Gastro-esophageal reflux disease with esophagitis 07/22/2022   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   OSA on CPAP 12/13/2021   Coronary artery disease involving native  coronary artery of native heart 09/21/2021   HFrEF (heart failure with reduced ejection fraction) (HCC)    Cerebrovascular disease 06/05/2021   Hiatal hernia 05/29/2021   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   Hemorrhoids 07/17/2020   Diverticulitis 05/25/2018   Osteoarthritis of right knee 12/13/2017   OA (osteoarthritis) of knee 12/13/2017   Testosterone  deficiency 03/22/2017   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/27/2016   BPH (benign prostatic hyperplasia) 06/18/2013   HLD (hyperlipidemia) 07/13/2007   Essential hypertension 07/13/2007   Allergic rhinitis 07/13/2007   History of nephrolithiasis 07/13/2007   Past Surgical History:  Procedure Laterality Date   BROW LIFT Bilateral 11/10/2020   Procedure: BLEPHAROPLASTY;  Surgeon: Elisabeth Craig RAMAN, MD;  Location: [facility];  Service: Plastics;  Laterality: Bilateral;  1 hour   COLONOSCOPY N/A 04/12/2022   Procedure: COLONOSCOPY;  Surgeon: Burnette Fallow, MD;  Location: [facility];  Service: Gastroenterology;  Laterality: N/A;   COLONOSCOPY WITH PROPOFOL  N/A 02/17/2019   Procedure: COLONOSCOPY WITH PROPOFOL ;  Surgeon: Saintclair Jasper, MD;  Location: [facility];  Service: Gastroenterology;  Laterality: N/A;   CORONARY STENT INTERVENTION N/A 08/25/2021   Procedure: CORONARY STENT INTERVENTION;  Surgeon: Dann Candyce RAMAN, MD;  Location: [facility] INVASIVE CV LAB;  Service: Cardiovascular;  Laterality: N/A;   CORONARY ULTRASOUND/IVUS N/A 08/25/2021   Procedure: Intravascular Ultrasound/IVUS;  Surgeon: Dann Candyce RAMAN, MD;  Location: [facility] INVASIVE CV LAB;  Service: Cardiovascular;  Laterality: N/A;   EYE SURGERY     HYDROCELE EXCISION  Right 05/15/2003   IR ANGIOGRAM VISCERAL SELECTIVE  02/16/2019   IR ANGIOGRAM VISCERAL SELECTIVE  02/16/2019   IR ANGIOGRAM VISCERAL SELECTIVE  02/16/2019   IR US  GUIDE VASC ACCESS RIGHT  02/16/2019   JOINT REPLACEMENT     LEFT HEART CATH  AND CORONARY ANGIOGRAPHY N/A 07/28/2021   Procedure: LEFT HEART CATH AND CORONARY ANGIOGRAPHY;  Surgeon: Dann Candyce RAMAN, MD;  Location: [facility] INVASIVE CV LAB;  Service: Cardiovascular;  Laterality: N/A;   LEFT HEART CATH AND CORONARY ANGIOGRAPHY N/A 08/25/2021   Procedure: LEFT HEART CATH AND CORONARY ANGIOGRAPHY;  Surgeon: Dann Candyce RAMAN, MD;  Location: [facility] INVASIVE CV LAB;  Service: Cardiovascular;  Laterality: N/A;   PERIPHERAL INTRAVASCULAR LITHOTRIPSY  08/25/2021   Procedure: INTRAVASCULAR LITHOTRIPSY;  Surgeon: Dann Candyce RAMAN, MD;  Location: [facility] INVASIVE CV LAB;  Service: Cardiovascular;;   ROTATOR CUFF REPAIR     SPERMATOCELECTOMY Right 05/15/2003   TOTAL KNEE ARTHROPLASTY Left 07/11/2023   Procedure: TOTAL KNEE ARTHROPLASTY;  Surgeon: Melodi Lerner, MD;  Location: [facility];  Service: Orthopedics;  Laterality: Left;   TOTAL KNEE ARTHROPLASTY Right 11/05/2024   Procedure: ARTHROPLASTY, KNEE, TOTAL;  Surgeon: Melodi Lerner, MD;  Location: [facility];  Service: Orthopedics;  Laterality: Right;   Family History  Problem Relation Age of Onset   Cancer Brother    Outpatient Medications Prior to Visit  Medication Sig Dispense Refill   albuterol  (PROVENTIL ) (5 MG/ML) 0.5% nebulizer solution Take 0.5 mLs (2.5 mg total) by nebulization every 6 (six) hours as needed for wheezing or shortness of breath. 20 mL 12   albuterol  (VENTOLIN  HFA) 108 (90 Base) MCG/ACT inhaler Inhale 2 puffs into the lungs every 4 (four) hours as needed for wheezing or shortness of breath. 8 g 0   Ascorbic Acid (VITAMIN C) 1000 MG tablet Take 1,000 mg by mouth 2 (two) times a week.     benzonatate  (TESSALON ) 100 MG capsule Take 1 capsule (100 mg total) by mouth 3 (three) times daily as needed for cough. 30 capsule 0   cetirizine  (ZYRTEC ) 10 MG tablet Take 1 tablet (10 mg total) by mouth daily. 90 tablet 2   fluticasone  (FLONASE ) 50 MCG/ACT nasal spray Place 2 sprays into both nostrils daily. 16 g 6    hydrochlorothiazide  (HYDRODIURIL ) 25 MG tablet Take 1 tablet (25 mg total) by mouth daily. 90 tablet 3   hydrOXYzine  (ATARAX ) 10 MG tablet Take 1 tablet (10 mg total) by mouth every 6 (six) hours as needed for itching. 20 tablet 0   latanoprost  (XALATAN ) 0.005 % ophthalmic solution Place 1 drop into both eyes at bedtime. 7.5 mL 1   Magnesium  400 MG TABS Take 400 mg by mouth at bedtime.     methocarbamol  (ROBAXIN ) 500 MG tablet Take 1 tablet (500 mg total) by mouth every 6 (six) hours as needed for muscle spasms. 40 tablet 0   nitroGLYCERIN  (NITROSTAT ) 0.4 MG SL tablet Place 1 tablet (0.4 mg total) under the tongue every 5 (five) minutes as needed for chest pain. 25 tablet 3   ondansetron  (ZOFRAN ) 4 MG tablet Take 1 tablet (4 mg total) by mouth every 6 (six) hours as needed for nausea. 20 tablet 0   pantoprazole  (PROTONIX ) 40 MG tablet Take 1 tablet by mouth once daily 90 tablet 1   Polyvinyl Alcohol -Povidone (REFRESH OP) Place 1 drop into the left eye 2 (two) times daily as needed (dryness).     psyllium (METAMUCIL) 58.6 % powder Take 1 packet by mouth daily.  rosuvastatin  (CRESTOR ) 20 MG tablet Take 1 tablet (20 mg total) by mouth daily. 90 tablet 0   tadalafil  (CIALIS ) 20 MG tablet Take 1 tablet (20 mg total) by mouth every other day as needed for erectile dysfunction. 30 tablet 5   tamsulosin  (FLOMAX ) 0.4 MG CAPS capsule TAKE 1 CAPSULE BY MOUTH DAILY 100 capsule 2   valsartan  (DIOVAN ) 320 MG tablet Take 1 tablet (320 mg total) by mouth daily. 90 tablet 3   zinc gluconate 50 MG tablet Take 50 mg by mouth daily.     No facility-administered medications prior to visit.   Allergies[1]   Objective:   Today's Vitals   12/27/24 1034  BP: 130/84  Pulse: 79  Temp: 97.7 F (36.5 C)  TempSrc: Temporal  SpO2: 98%  Weight: 165 lb 12.8 oz (75.2 kg)  Height: 5' 4.5 (1.638 m)   Body mass index is 28.02 kg/m.   General: Well developed, well nourished. No acute distress. Back: Straight. No  bruising noted. There is pain on palpation over the coccygeal area. Psych: Alert and oriented. Normal mood and affect.  Health Maintenance Due  Topic Date Due   Medicare Annual Wellness (AWV)  01/15/2025   X-ray:  Sacrum and coccyx No fracture or dislocation noted.    Assessment & Plan:   Problem List Items Addressed This Visit   None Visit Diagnoses       Contusion of coccyx, initial encounter    -  Primary   Reassured patient. Recommend use of a ring pillow. Recommend heat. May use tramadol  if needed (patient already has this at home).   Relevant Orders   DG Sacrum/Coccyx       Return if symptoms worsen or fail to improve.    Garnette CHRISTELLA Simpler, MD  I,Emily Lagle,acting as a scribe for Garnette CHRISTELLA Simpler, MD.,have documented all relevant documentation on the behalf of Garnette CHRISTELLA Simpler, MD.  I, Garnette CHRISTELLA Simpler, MD, have reviewed all documentation for this visit. The documentation on 12/27/2024 for the exam, diagnosis, procedures, and orders are all accurate and complete.      [1] No Known Allergies  "

## 2024-12-31 ENCOUNTER — Ambulatory Visit: Payer: Self-pay | Admitting: Family Medicine

## 2025-01-18 ENCOUNTER — Ambulatory Visit: Payer: Medicare Other

## 2025-01-21 ENCOUNTER — Ambulatory Visit
